# Patient Record
Sex: Female | Born: 1945 | Race: White | Hispanic: No | Smoking: Never smoker
Health system: Southern US, Community
[De-identification: ages and names within clinical notes are randomized; demographics above are authoritative.]

## PROBLEM LIST (undated history)

## (undated) DIAGNOSIS — E119 Type 2 diabetes mellitus without complications: Secondary | ICD-10-CM

## (undated) DIAGNOSIS — J449 Chronic obstructive pulmonary disease, unspecified: Secondary | ICD-10-CM

## (undated) DIAGNOSIS — R600 Localized edema: Secondary | ICD-10-CM

## (undated) DIAGNOSIS — R42 Dizziness and giddiness: Secondary | ICD-10-CM

## (undated) DIAGNOSIS — G43909 Migraine, unspecified, not intractable, without status migrainosus: Secondary | ICD-10-CM

## (undated) DIAGNOSIS — M4802 Spinal stenosis, cervical region: Secondary | ICD-10-CM

## (undated) DIAGNOSIS — I209 Angina pectoris, unspecified: Secondary | ICD-10-CM

## (undated) DIAGNOSIS — F32A Depression, unspecified: Secondary | ICD-10-CM

## (undated) DIAGNOSIS — E559 Vitamin D deficiency, unspecified: Secondary | ICD-10-CM

## (undated) DIAGNOSIS — I480 Paroxysmal atrial fibrillation: Secondary | ICD-10-CM

## (undated) DIAGNOSIS — I272 Pulmonary hypertension, unspecified: Secondary | ICD-10-CM

## (undated) DIAGNOSIS — Z531 Procedure and treatment not carried out because of patient's decision for reasons of belief and group pressure: Secondary | ICD-10-CM

## (undated) DIAGNOSIS — R002 Palpitations: Secondary | ICD-10-CM

## (undated) DIAGNOSIS — J45909 Unspecified asthma, uncomplicated: Secondary | ICD-10-CM

## (undated) DIAGNOSIS — K219 Gastro-esophageal reflux disease without esophagitis: Secondary | ICD-10-CM

## (undated) DIAGNOSIS — E079 Disorder of thyroid, unspecified: Secondary | ICD-10-CM

## (undated) DIAGNOSIS — J189 Pneumonia, unspecified organism: Secondary | ICD-10-CM

## (undated) DIAGNOSIS — R011 Cardiac murmur, unspecified: Secondary | ICD-10-CM

## (undated) DIAGNOSIS — I700 Atherosclerosis of aorta: Secondary | ICD-10-CM

## (undated) DIAGNOSIS — Z9889 Other specified postprocedural states: Secondary | ICD-10-CM

## (undated) DIAGNOSIS — I10 Essential (primary) hypertension: Secondary | ICD-10-CM

## (undated) DIAGNOSIS — G8929 Other chronic pain: Secondary | ICD-10-CM

## (undated) DIAGNOSIS — R519 Headache, unspecified: Secondary | ICD-10-CM

## (undated) DIAGNOSIS — I4510 Unspecified right bundle-branch block: Secondary | ICD-10-CM

## (undated) DIAGNOSIS — R0609 Other forms of dyspnea: Secondary | ICD-10-CM

## (undated) DIAGNOSIS — M1990 Unspecified osteoarthritis, unspecified site: Secondary | ICD-10-CM

## (undated) DIAGNOSIS — R296 Repeated falls: Secondary | ICD-10-CM

## (undated) DIAGNOSIS — E785 Hyperlipidemia, unspecified: Secondary | ICD-10-CM

## (undated) DIAGNOSIS — M349 Systemic sclerosis, unspecified: Secondary | ICD-10-CM

## (undated) DIAGNOSIS — F329 Major depressive disorder, single episode, unspecified: Secondary | ICD-10-CM

## (undated) DIAGNOSIS — I639 Cerebral infarction, unspecified: Secondary | ICD-10-CM

## (undated) DIAGNOSIS — T4145XA Adverse effect of unspecified anesthetic, initial encounter: Secondary | ICD-10-CM

## (undated) DIAGNOSIS — G4730 Sleep apnea, unspecified: Secondary | ICD-10-CM

## (undated) DIAGNOSIS — Z7901 Long term (current) use of anticoagulants: Secondary | ICD-10-CM

## (undated) DIAGNOSIS — I499 Cardiac arrhythmia, unspecified: Secondary | ICD-10-CM

## (undated) DIAGNOSIS — F419 Anxiety disorder, unspecified: Secondary | ICD-10-CM

## (undated) DIAGNOSIS — Z87898 Personal history of other specified conditions: Secondary | ICD-10-CM

## (undated) DIAGNOSIS — I509 Heart failure, unspecified: Secondary | ICD-10-CM

## (undated) DIAGNOSIS — R609 Edema, unspecified: Secondary | ICD-10-CM

## (undated) DIAGNOSIS — R413 Other amnesia: Secondary | ICD-10-CM

## (undated) DIAGNOSIS — I2510 Atherosclerotic heart disease of native coronary artery without angina pectoris: Secondary | ICD-10-CM

## (undated) DIAGNOSIS — I441 Atrioventricular block, second degree: Secondary | ICD-10-CM

## (undated) DIAGNOSIS — U071 COVID-19: Secondary | ICD-10-CM

## (undated) DIAGNOSIS — I4891 Unspecified atrial fibrillation: Secondary | ICD-10-CM

## (undated) DIAGNOSIS — K5790 Diverticulosis of intestine, part unspecified, without perforation or abscess without bleeding: Secondary | ICD-10-CM

## (undated) DIAGNOSIS — T8859XA Other complications of anesthesia, initial encounter: Secondary | ICD-10-CM

## (undated) DIAGNOSIS — I6789 Other cerebrovascular disease: Secondary | ICD-10-CM

## (undated) DIAGNOSIS — I5030 Unspecified diastolic (congestive) heart failure: Secondary | ICD-10-CM

## (undated) DIAGNOSIS — R0600 Dyspnea, unspecified: Secondary | ICD-10-CM

## (undated) DIAGNOSIS — G959 Disease of spinal cord, unspecified: Secondary | ICD-10-CM

## (undated) DIAGNOSIS — Z87442 Personal history of urinary calculi: Secondary | ICD-10-CM

## (undated) DIAGNOSIS — R112 Nausea with vomiting, unspecified: Secondary | ICD-10-CM

## (undated) DIAGNOSIS — E039 Hypothyroidism, unspecified: Secondary | ICD-10-CM

## (undated) DIAGNOSIS — M109 Gout, unspecified: Secondary | ICD-10-CM

## (undated) DIAGNOSIS — Z8719 Personal history of other diseases of the digestive system: Secondary | ICD-10-CM

## (undated) DIAGNOSIS — R51 Headache: Secondary | ICD-10-CM

## (undated) DIAGNOSIS — Z5309 Procedure and treatment not carried out because of other contraindication: Secondary | ICD-10-CM

## (undated) HISTORY — PX: SHOULDER SURGERY: SHX246

## (undated) HISTORY — PX: FRACTURE SURGERY: SHX138

## (undated) HISTORY — PX: NISSEN FUNDOPLICATION: SHX2091

## (undated) HISTORY — DX: Disorder of thyroid, unspecified: E07.9

## (undated) HISTORY — DX: Systemic sclerosis, unspecified: M34.9

## (undated) HISTORY — PX: ABDOMINAL HYSTERECTOMY: SHX81

## (undated) HISTORY — DX: Gastro-esophageal reflux disease without esophagitis: K21.9

## (undated) HISTORY — PX: EYE SURGERY: SHX253

## (undated) HISTORY — PX: CARDIAC CATHETERIZATION: SHX172

## (undated) HISTORY — DX: Essential (primary) hypertension: I10

## (undated) HISTORY — PX: TONSILLECTOMY: SUR1361

## (undated) HISTORY — PX: DILATION AND CURETTAGE OF UTERUS: SHX78

## (undated) HISTORY — PX: LITHOTRIPSY: SUR834

## (undated) HISTORY — PX: BLADDER SURGERY: SHX569

## (undated) HISTORY — PX: CARPAL TUNNEL RELEASE: SHX101

## (undated) HISTORY — DX: Gout, unspecified: M10.9

## (undated) HISTORY — DX: Type 2 diabetes mellitus without complications: E11.9

## (undated) NOTE — *Deleted (*Deleted)
[facility]  Name: Teresa [name]  MRN: [id number] DOB: September 09, 1945  Chief Complaint/ HPI  [name],  68 y.o. , female presents for their Follow-Up CCM visit with the clinical pharmacist via telephone due to COVID-19 Pandemic.  PCP : Maple Hudson., MD  Their chronic conditions include: ***  Office Visits:***  Consult Visit:***  Medications: Outpatient Encounter Medications as of 05/28/2020  Medication Sig Note  . acetaminophen (TYLENOL) 500 MG tablet Take 500 mg by mouth every 6 (six) hours as needed for mild pain or headache.   . allopurinol (ZYLOPRIM) 100 MG tablet TAKE 1 TABLET BY MOUTH EVERY DAY 05/17/2019: Takes at bedtime  . atorvastatin (LIPITOR) 10 MG tablet TAKE 1 TABLET BY MOUTH EVERY DAY AT BEDTIME   . benzonatate (TESSALON) 200 MG capsule Take 1 capsule (200 mg total) by mouth 3 (three) times daily. (Patient not taking: Reported on 12/05/2019)   . budesonide (PULMICORT) 0.5 MG/2ML nebulizer solution Inhale into the lungs.   . colchicine 0.6 MG tablet Take 0.6 mg by mouth daily as needed (for gout flare-ups).    . Continuous Blood Gluc Receiver (FREESTYLE LIBRE 2 READER) DEVI 1 each by Does not apply route continuous.   . Continuous Blood Gluc Sensor (FREESTYLE LIBRE 2 SENSOR) MISC USE EVERY 14 DAYS AS DIRECTED   . furosemide (LASIX) 40 MG tablet TAKE 1 TABLET BY MOUTH EVERY DAY   . gabapentin (NEURONTIN) 600 MG tablet Take 1 tablet (600 mg total) by mouth 3 (three) times daily.   Marland Kitchen glimepiride (AMARYL) 4 MG tablet TAKE 1 TABLET (4 MG TOTAL) BY MOUTH 2 (TWO) TIMES DAILY.   Marland Kitchen guaiFENesin-dextromethorphan (ROBITUSSIN DM) 100-10 MG/5ML syrup Take 5 mLs by mouth every 4 (four) hours as needed for cough. (Patient not taking: Reported on 12/05/2019)   . JARDIANCE 25 MG TABS tablet TAKE 25 MG BY MOUTH DAILY.   Marland Kitchen levothyroxine (SYNTHROID) 88 MCG tablet TAKE 1 TABLET BY MOUTH EVERY DAY   . losartan (COZAAR) 25 MG tablet Take 1 tablet (25 mg  total) by mouth daily.   Marland Kitchen menthol-cetylpyridinium (CEPACOL) 3 MG lozenge Take 1 lozenge (3 mg total) by mouth as needed for sore throat. (Patient not taking: Reported on 12/05/2019)   . metFORMIN (GLUCOPHAGE) 1000 MG tablet TAKE 1 TABLET BY MOUTH TWICE A DAY   . nystatin cream (MYCOSTATIN) Apply 1 application topically 2 (two) times daily. (Patient taking differently: Apply 1 application topically 2 (two) times daily as needed (for diabetic yeast reactions.). )   . ondansetron (ZOFRAN) 4 MG tablet TAKE 1 TABLET BY MOUTH EVERY 8 HOURS AS NEEDED FOR NAUSEA AND VOMITING   . OZEMPIC, 0.25 OR 0.5 MG/DOSE, 2 MG/1.5ML SOPN INJECT 0.5MG  INTO THE SKIN ONCE A WEEK.   . pioglitazone (ACTOS) 30 MG tablet TAKE 1 TABLET BY MOUTH EVERY DAY    No facility-administered encounter medications on file as of 05/28/2020.     Current Diagnosis/Assessment:  Goals Addressed   None       Diabetes   Recent Relevant Labs: Lab Results  Component Value Date/Time   HGBA1C 7.4 (A) 01/19/2020 10:29 AM   HGBA1C 7.4 (H) 09/18/2019 11:21 AM   HGBA1C 8.4 (H) 05/17/2019 03:05 AM   HGBA1C 7.6 (H) 04/04/2013 10:47 AM   MICROALBUR 50 06/06/2017 11:26 AM   MICROALBUR 20 12/08/2015 10:39 AM     Checking BG: {CHL HP Blood Glucose Monitoring [id number]  Recent FBG Readings: Recent pre-meal BG readings: 109 -  155 Recent 2hr PP BG readings:  *** Recent HS BG readings: *** Patient has failed these meds in past: *** Patient is currently {CHL Controlled/Uncontrolled:[phone]} on the following medications: ***  Last diabetic Foot exam:  Lab Results  Component Value Date/Time   HMDIABEYEEXA No Retinopathy 07/07/2019 12:00 AM    Last diabetic Eye exam: No results found for: HMDIABFOOTEX   We discussed:  Frequent hypoglycemia   Plan  Continue {CHL HP Upstream Pharmacy Plans:[phone]}  Medication Management   Pt uses *** pharmacy for all medications Uses pill box? {Yes or If no, why not?:20788} Pt  endorses ***% compliance  We discussed:  Hasn't tried 400mg  extra Living with son, but DIL is "evil" Son paying for medications   Plan  {US Pharmacy ZOXW:96045}    Follow up: *** month phone visit  ***

---

## 2004-06-15 ENCOUNTER — Ambulatory Visit: Payer: Self-pay

## 2004-10-30 ENCOUNTER — Ambulatory Visit (HOSPITAL_COMMUNITY): Admission: RE | Admit: 2004-10-30 | Discharge: 2004-10-30 | Payer: Self-pay | Admitting: Neurosurgery

## 2004-11-13 ENCOUNTER — Encounter: Payer: Self-pay | Admitting: Internal Medicine

## 2004-11-24 ENCOUNTER — Inpatient Hospital Stay (HOSPITAL_COMMUNITY): Admission: RE | Admit: 2004-11-24 | Discharge: 2004-12-06 | Payer: Self-pay | Admitting: Neurosurgery

## 2004-12-02 ENCOUNTER — Ambulatory Visit: Payer: Self-pay | Admitting: Critical Care Medicine

## 2004-12-20 ENCOUNTER — Ambulatory Visit (HOSPITAL_COMMUNITY): Admission: RE | Admit: 2004-12-20 | Discharge: 2004-12-20 | Payer: Self-pay | Admitting: Neurosurgery

## 2005-01-24 ENCOUNTER — Ambulatory Visit (HOSPITAL_COMMUNITY): Admission: RE | Admit: 2005-01-24 | Discharge: 2005-01-24 | Payer: Self-pay | Admitting: Neurosurgery

## 2005-03-31 ENCOUNTER — Ambulatory Visit (HOSPITAL_COMMUNITY): Admission: RE | Admit: 2005-03-31 | Discharge: 2005-03-31 | Payer: Self-pay | Admitting: Neurosurgery

## 2005-05-31 ENCOUNTER — Ambulatory Visit (HOSPITAL_COMMUNITY): Admission: RE | Admit: 2005-05-31 | Discharge: 2005-05-31 | Payer: Self-pay | Admitting: Neurosurgery

## 2005-08-22 ENCOUNTER — Ambulatory Visit: Payer: Self-pay

## 2005-10-02 ENCOUNTER — Ambulatory Visit (HOSPITAL_COMMUNITY): Admission: RE | Admit: 2005-10-02 | Discharge: 2005-10-02 | Payer: Self-pay | Admitting: Neurosurgery

## 2006-02-15 LAB — HM PAP SMEAR: HM PAP: NORMAL

## 2006-05-21 ENCOUNTER — Ambulatory Visit: Payer: Self-pay | Admitting: Unknown Physician Specialty

## 2006-09-10 ENCOUNTER — Emergency Department: Payer: Self-pay | Admitting: Emergency Medicine

## 2006-10-09 ENCOUNTER — Encounter: Admission: RE | Admit: 2006-10-09 | Discharge: 2006-11-05 | Payer: Self-pay | Admitting: Neurology

## 2006-10-25 ENCOUNTER — Ambulatory Visit: Payer: Self-pay | Admitting: Family Medicine

## 2006-11-06 ENCOUNTER — Encounter: Admission: RE | Admit: 2006-11-06 | Discharge: 2007-02-04 | Payer: Self-pay | Admitting: Neurology

## 2007-03-05 ENCOUNTER — Ambulatory Visit: Payer: Self-pay | Admitting: Internal Medicine

## 2007-03-05 LAB — CONVERTED CEMR LAB
BUN: 19 mg/dL
Basophils Absolute: 0.1 10*3/uL
Basophils Relative: 0.9 %
CO2: 31 meq/L
Calcium: 9.8 mg/dL
Chloride: 101 meq/L
Creatinine, Ser: 0.7 mg/dL
Eosinophils Absolute: 0.4 10*3/uL
Eosinophils Relative: 5.3 % — ABNORMAL HIGH
GFR calc Af Amer: 109 mL/min
GFR calc non Af Amer: 90 mL/min
Glucose, Bld: 142 mg/dL — ABNORMAL HIGH
HCT: 45.1 %
Hemoglobin: 15.1 g/dL — ABNORMAL HIGH
Lymphocytes Relative: 38.8 %
MCHC: 33.5 g/dL
MCV: 89.9 fL
Monocytes Absolute: 0.5 10*3/uL
Monocytes Relative: 6.7 %
Neutro Abs: 3.9 10*3/uL
Neutrophils Relative %: 48.3 %
Platelets: 252 10*3/uL
Potassium: 4.3 meq/L
Pro B Natriuretic peptide (BNP): 90 pg/mL
RBC: 5.02 M/uL
RDW: 13.3 %
Sed Rate: 4 mm/h
Sodium: 142 meq/L
WBC: 8 10*3/uL

## 2007-03-08 ENCOUNTER — Ambulatory Visit (HOSPITAL_COMMUNITY): Admission: RE | Admit: 2007-03-08 | Discharge: 2007-03-08 | Payer: Self-pay | Admitting: Internal Medicine

## 2007-04-16 ENCOUNTER — Ambulatory Visit: Payer: Self-pay | Admitting: Internal Medicine

## 2007-05-20 ENCOUNTER — Ambulatory Visit: Payer: Self-pay | Admitting: Internal Medicine

## 2007-07-03 ENCOUNTER — Ambulatory Visit: Payer: Self-pay | Admitting: Internal Medicine

## 2007-07-03 LAB — CONVERTED CEMR LAB
Chloride: 102 meq/L (ref 96–112)
Eosinophils Absolute: 0.4 10*3/uL (ref 0.0–0.6)
Eosinophils Relative: 4.9 % (ref 0.0–5.0)
GFR calc Af Amer: 94 mL/min
GFR calc non Af Amer: 78 mL/min
Glucose, Bld: 142 mg/dL — ABNORMAL HIGH (ref 70–99)
HCT: 43.1 % (ref 36.0–46.0)
Hemoglobin: 14.8 g/dL (ref 12.0–15.0)
Lymphocytes Relative: 41.1 % (ref 12.0–46.0)
MCV: 90.8 fL (ref 78.0–100.0)
Monocytes Absolute: 0.6 10*3/uL (ref 0.2–0.7)
Neutro Abs: 3.6 10*3/uL (ref 1.4–7.7)
Neutrophils Relative %: 45.3 % (ref 43.0–77.0)
Sodium: 142 meq/L (ref 135–145)
WBC: 7.8 10*3/uL (ref 4.5–10.5)

## 2007-07-16 ENCOUNTER — Ambulatory Visit: Payer: Self-pay | Admitting: Internal Medicine

## 2007-07-16 DIAGNOSIS — E119 Type 2 diabetes mellitus without complications: Secondary | ICD-10-CM | POA: Insufficient documentation

## 2007-07-16 DIAGNOSIS — E7800 Pure hypercholesterolemia, unspecified: Secondary | ICD-10-CM | POA: Insufficient documentation

## 2007-07-16 DIAGNOSIS — R059 Cough, unspecified: Secondary | ICD-10-CM | POA: Insufficient documentation

## 2007-07-16 DIAGNOSIS — R05 Cough: Secondary | ICD-10-CM

## 2007-07-16 DIAGNOSIS — M349 Systemic sclerosis, unspecified: Secondary | ICD-10-CM | POA: Insufficient documentation

## 2007-07-16 DIAGNOSIS — E1140 Type 2 diabetes mellitus with diabetic neuropathy, unspecified: Secondary | ICD-10-CM | POA: Insufficient documentation

## 2007-07-16 DIAGNOSIS — E669 Obesity, unspecified: Secondary | ICD-10-CM | POA: Insufficient documentation

## 2007-07-16 DIAGNOSIS — R0602 Shortness of breath: Secondary | ICD-10-CM | POA: Insufficient documentation

## 2007-07-16 DIAGNOSIS — K219 Gastro-esophageal reflux disease without esophagitis: Secondary | ICD-10-CM | POA: Insufficient documentation

## 2007-07-16 DIAGNOSIS — I259 Chronic ischemic heart disease, unspecified: Secondary | ICD-10-CM | POA: Insufficient documentation

## 2007-08-06 ENCOUNTER — Telehealth: Payer: Self-pay | Admitting: Adult Health

## 2007-09-13 ENCOUNTER — Ambulatory Visit: Payer: Self-pay | Admitting: Internal Medicine

## 2007-10-01 ENCOUNTER — Ambulatory Visit: Payer: Self-pay | Admitting: Gastroenterology

## 2007-10-01 LAB — CONVERTED CEMR LAB
Amylase: 91 units/L (ref 27–131)
Ferritin: 116 ng/mL (ref 10.0–291.0)
Folate: 8.1 ng/mL
Saturation Ratios: 28.4 % (ref 20.0–50.0)

## 2007-10-23 ENCOUNTER — Ambulatory Visit: Payer: Self-pay | Admitting: Gastroenterology

## 2007-10-23 ENCOUNTER — Encounter: Payer: Self-pay | Admitting: Gastroenterology

## 2007-10-23 DIAGNOSIS — K2990 Gastroduodenitis, unspecified, without bleeding: Secondary | ICD-10-CM

## 2007-10-23 DIAGNOSIS — K5730 Diverticulosis of large intestine without perforation or abscess without bleeding: Secondary | ICD-10-CM | POA: Insufficient documentation

## 2007-10-23 DIAGNOSIS — K2970 Gastritis, unspecified, without bleeding: Secondary | ICD-10-CM | POA: Insufficient documentation

## 2007-11-22 DIAGNOSIS — I209 Angina pectoris, unspecified: Secondary | ICD-10-CM | POA: Insufficient documentation

## 2007-11-22 DIAGNOSIS — M1990 Unspecified osteoarthritis, unspecified site: Secondary | ICD-10-CM | POA: Insufficient documentation

## 2007-11-22 DIAGNOSIS — F3289 Other specified depressive episodes: Secondary | ICD-10-CM | POA: Insufficient documentation

## 2007-11-22 DIAGNOSIS — E039 Hypothyroidism, unspecified: Secondary | ICD-10-CM | POA: Insufficient documentation

## 2007-11-22 DIAGNOSIS — F411 Generalized anxiety disorder: Secondary | ICD-10-CM | POA: Insufficient documentation

## 2007-11-22 DIAGNOSIS — I119 Hypertensive heart disease without heart failure: Secondary | ICD-10-CM | POA: Insufficient documentation

## 2007-11-22 DIAGNOSIS — G4730 Sleep apnea, unspecified: Secondary | ICD-10-CM | POA: Insufficient documentation

## 2007-11-22 DIAGNOSIS — R51 Headache: Secondary | ICD-10-CM | POA: Insufficient documentation

## 2007-11-22 DIAGNOSIS — R519 Headache, unspecified: Secondary | ICD-10-CM | POA: Insufficient documentation

## 2007-11-22 DIAGNOSIS — F419 Anxiety disorder, unspecified: Secondary | ICD-10-CM | POA: Insufficient documentation

## 2007-11-22 DIAGNOSIS — F329 Major depressive disorder, single episode, unspecified: Secondary | ICD-10-CM

## 2007-11-22 DIAGNOSIS — N200 Calculus of kidney: Secondary | ICD-10-CM | POA: Insufficient documentation

## 2007-11-23 ENCOUNTER — Emergency Department: Payer: Self-pay | Admitting: Emergency Medicine

## 2007-11-26 ENCOUNTER — Ambulatory Visit: Payer: Self-pay | Admitting: Gastroenterology

## 2007-12-03 ENCOUNTER — Telehealth (INDEPENDENT_AMBULATORY_CARE_PROVIDER_SITE_OTHER): Payer: Self-pay | Admitting: *Deleted

## 2008-01-07 ENCOUNTER — Ambulatory Visit: Payer: Self-pay | Admitting: Gastroenterology

## 2008-01-07 DIAGNOSIS — K449 Diaphragmatic hernia without obstruction or gangrene: Secondary | ICD-10-CM | POA: Insufficient documentation

## 2008-01-24 ENCOUNTER — Ambulatory Visit: Payer: Self-pay | Admitting: Internal Medicine

## 2008-02-05 ENCOUNTER — Ambulatory Visit: Payer: Self-pay | Admitting: Family Medicine

## 2008-02-11 ENCOUNTER — Encounter: Payer: Self-pay | Admitting: Internal Medicine

## 2008-02-11 ENCOUNTER — Ambulatory Visit: Admission: RE | Admit: 2008-02-11 | Discharge: 2008-02-11 | Payer: Self-pay | Admitting: Internal Medicine

## 2008-02-11 ENCOUNTER — Ambulatory Visit: Payer: Self-pay | Admitting: Internal Medicine

## 2008-02-24 LAB — HM DEXA SCAN: HM DEXA SCAN: NORMAL

## 2008-08-05 ENCOUNTER — Ambulatory Visit: Payer: Self-pay | Admitting: Family Medicine

## 2008-09-03 ENCOUNTER — Ambulatory Visit: Payer: Self-pay | Admitting: Family Medicine

## 2009-04-23 ENCOUNTER — Ambulatory Visit: Payer: Self-pay | Admitting: Family Medicine

## 2009-06-16 ENCOUNTER — Telehealth: Payer: Self-pay | Admitting: Gastroenterology

## 2009-06-21 ENCOUNTER — Encounter: Payer: Self-pay | Admitting: Physician Assistant

## 2009-06-21 ENCOUNTER — Ambulatory Visit: Payer: Self-pay | Admitting: Gastroenterology

## 2009-06-21 ENCOUNTER — Ambulatory Visit (HOSPITAL_COMMUNITY): Admission: RE | Admit: 2009-06-21 | Discharge: 2009-06-21 | Payer: Self-pay | Admitting: Gastroenterology

## 2009-06-21 DIAGNOSIS — I2510 Atherosclerotic heart disease of native coronary artery without angina pectoris: Secondary | ICD-10-CM | POA: Insufficient documentation

## 2009-06-21 DIAGNOSIS — I10 Essential (primary) hypertension: Secondary | ICD-10-CM | POA: Insufficient documentation

## 2009-06-21 DIAGNOSIS — R110 Nausea: Secondary | ICD-10-CM | POA: Insufficient documentation

## 2009-06-21 DIAGNOSIS — R509 Fever, unspecified: Secondary | ICD-10-CM | POA: Insufficient documentation

## 2009-06-21 DIAGNOSIS — R1011 Right upper quadrant pain: Secondary | ICD-10-CM | POA: Insufficient documentation

## 2009-06-21 DIAGNOSIS — R1319 Other dysphagia: Secondary | ICD-10-CM | POA: Insufficient documentation

## 2009-06-21 DIAGNOSIS — E785 Hyperlipidemia, unspecified: Secondary | ICD-10-CM | POA: Insufficient documentation

## 2009-06-21 LAB — CONVERTED CEMR LAB
Albumin: 4 g/dL (ref 3.5–5.2)
CO2: 28 meq/L (ref 19–32)
Calcium: 8.8 mg/dL (ref 8.4–10.5)
Eosinophils Relative: 4.2 % (ref 0.0–5.0)
GFR calc non Af Amer: 107.04 mL/min (ref >60)
Glucose, Bld: 154 mg/dL — ABNORMAL HIGH (ref 70–99)
HCT: 42 % (ref 36.0–46.0)
Hep B C IgM: NEGATIVE
Lipase: 24 units/L (ref 11.0–59.0)
Lymphocytes Relative: 33.4 % (ref 12.0–46.0)
Monocytes Relative: 7.1 % (ref 3.0–12.0)
Neutrophils Relative %: 54.7 % (ref 43.0–77.0)
Platelets: 220 10*3/uL (ref 150.0–400.0)
Sodium: 141 meq/L (ref 135–145)
Total Bilirubin: 0.6 mg/dL (ref 0.3–1.2)
Total Protein: 7.1 g/dL (ref 6.0–8.3)
WBC: 7.7 10*3/uL (ref 4.5–10.5)

## 2009-06-24 ENCOUNTER — Telehealth: Payer: Self-pay | Admitting: Physician Assistant

## 2009-07-09 ENCOUNTER — Ambulatory Visit (HOSPITAL_COMMUNITY): Admission: RE | Admit: 2009-07-09 | Discharge: 2009-07-09 | Payer: Self-pay | Admitting: Gastroenterology

## 2009-07-12 ENCOUNTER — Telehealth: Payer: Self-pay | Admitting: Gastroenterology

## 2009-08-24 ENCOUNTER — Ambulatory Visit: Payer: Self-pay | Admitting: Gastroenterology

## 2009-08-31 ENCOUNTER — Ambulatory Visit (HOSPITAL_COMMUNITY): Admission: RE | Admit: 2009-08-31 | Discharge: 2009-08-31 | Payer: Self-pay | Admitting: Gastroenterology

## 2009-09-30 ENCOUNTER — Ambulatory Visit: Payer: Self-pay | Admitting: Family Medicine

## 2009-09-30 ENCOUNTER — Inpatient Hospital Stay: Payer: Self-pay | Admitting: Internal Medicine

## 2009-11-04 ENCOUNTER — Emergency Department: Payer: Self-pay | Admitting: Emergency Medicine

## 2009-12-31 ENCOUNTER — Ambulatory Visit: Payer: Self-pay | Admitting: Family Medicine

## 2009-12-31 ENCOUNTER — Inpatient Hospital Stay: Payer: Self-pay | Admitting: *Deleted

## 2010-02-03 ENCOUNTER — Ambulatory Visit: Payer: Self-pay

## 2010-02-18 ENCOUNTER — Ambulatory Visit: Payer: Self-pay

## 2010-03-05 ENCOUNTER — Ambulatory Visit: Payer: Self-pay

## 2010-03-29 ENCOUNTER — Encounter: Payer: Self-pay | Admitting: Family Medicine

## 2010-04-07 ENCOUNTER — Encounter: Payer: Self-pay | Admitting: Family Medicine

## 2010-05-07 ENCOUNTER — Encounter: Payer: Self-pay | Admitting: Family Medicine

## 2010-06-07 ENCOUNTER — Encounter: Payer: Self-pay | Admitting: Family Medicine

## 2010-07-07 ENCOUNTER — Encounter: Payer: Self-pay | Admitting: Family Medicine

## 2010-08-07 ENCOUNTER — Encounter: Payer: Self-pay | Admitting: Family Medicine

## 2010-08-17 ENCOUNTER — Ambulatory Visit: Payer: Self-pay | Admitting: Family Medicine

## 2010-08-18 ENCOUNTER — Ambulatory Visit: Payer: Self-pay | Admitting: Family Medicine

## 2010-08-29 ENCOUNTER — Other Ambulatory Visit: Payer: Self-pay | Admitting: Internal Medicine

## 2010-09-07 ENCOUNTER — Encounter: Payer: Self-pay | Admitting: Family Medicine

## 2010-09-08 NOTE — Assessment & Plan Note (Signed)
Summary: F-UP NAUSEA/YF   History of Present Illness Visit Type: Follow-up Visit Primary GI MD: Sheryn Bison MD FACP FAGA Primary Provider: Julieanne Manson, MD Chief Complaint: Patient here to f/u on nausea. She complains that she is still nauseated, in fact, worse than before. She does have right sided abdominal pain not in relation to food. No change in bowels. History of Present Illness:   This patient is a 65 year old Caucasian female with chronic multiple GI complaints. She has a long history of chronic GERD and had laparoscopic fundoplication done 18 years ago. I've seen her for many years because of continued acid reflux, but endoscopic exams have been normal. She also has sleep apnea and chronic shortness of breath with negative bronchoscopy and normal pulmonary functions per Dr. Sherene Sires. There is some suggestion that she may have scleroderma although I cannot see where this has been documented. She does complain of symptoms consistent with Raynaud's phenomenon.  She also has diabetes and is on metformin but does not take insulin. 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 She also suffers from chronic thyroid dysfunction and hyperlipidemia. Her essential hypertension is well controlled on Bystolic and HCTZ and Lasix. GI-wise she complains of chronic constant nausea unresponsive to PPI therapy and Phenergan. She denies true reflux, dysphagia, emesis, weight loss, constipation, gas, bloating, but does have some early satiety. She denies any specific food intolerances. Recent ultrasound and CCK HIDA scans have been normal. She is on tramadol 4-5 times a day for chronic pain syndrome. She denies abuse of alcohol, cigarettes, or NSAIDs. She does have chronic shortness of breath, etiology unclear.   GI Review of Systems    Reports abdominal pain, chest pain, dysphagia with liquids, dysphagia with solids, loss of appetite, nausea, and  vomiting.    Location of  Abdominal pain: right side.    Denies acid reflux, belching, bloating, heartburn, vomiting blood, weight loss, and  weight gain.        Denies anal fissure, black tarry stools, change in bowel habit, constipation, diarrhea, diverticulosis, fecal incontinence, heme positive stool, hemorrhoids, irritable bowel syndrome, jaundice, light color stool, liver problems, rectal bleeding, and  rectal pain. Preventive Screening-Counseling & Management  Caffeine-Diet-Exercise     Does Patient Exercise: yes    Current Medications (verified): 1)  Hydrochlorothiazide 25 Mg  Tabs (Hydrochlorothiazide) .... Take 1 Tablet By Mouth Once A Day 2)  Lasix 40 Mg  Tabs (Furosemide) .... Take 1 Tablet By Mouth Once A Day 3)  Synthroid 75 Mcg  Tabs (Levothyroxine Sodium) .... Take 1 Tablet By Mouth Once A Day 4)  Crestor 5 Mg  Tabs (Rosuvastatin Calcium) .... Take 1 Tablet By Mouth Once A Day 5)  Metformin Hcl 1000 Mg Tabs (Metformin Hcl) .... Two Times A Day 6)  Bystolic 10 Mg  Tabs (Nebivolol Hcl) .... Take 1 Tablet By Mouth Once A Day 7)  Provigil 200 Mg  Tabs (Modafinil) .... Once Daily 8)  Lyrica 75 Mg  Caps (Pregabalin) .... Three Times A Day 9)  Mucinex Dm 30-600 Mg  Tb12 (Dextromethorphan-Guaifenesin) .... Take 2 Tablets By Mouth  Two Times A Day As Needed 10)  Tramadol Hcl 50 Mg  Tabs (Tramadol Hcl) .... Take 1 Tablet By Mouth Every 4 Hours Prn 11)  Allegra 180 Mg  Tabs (Fexofenadine Hcl) .... Take 1 Tablet By Mouth Once A Day As Need For Itching Sneezing or Vertigo 12)  Carafate 1 Gm/16ml  Susp (Sucralfate) .... Take 10 Ml By Mouth Four Times A Day As Needed  Allergies (verified): 1)  ! * Ivp Dye 2)  ! Morphine 3)  ! Biaxin 4)  ! Codeine 5)  ! * Ambien 6)  ! Neosporin  Past History:  Past medical, surgical, family and social histories (including risk factors) reviewed for relevance to current acute and chronic problems.  Past Medical History: Reviewed history from 06/21/2009 and no  changes required. 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 positive for heart disease in her father Positive for mature arthritis and one of her sister neg resp Family History of Breast Cancer: Maternal Grandmother (mets) , Aunt Family History of Colon Cancer: Maternal Grandmother (primary cancer) Family History of Ovarian Cancer: Aunt Family History of Colon Polyps: Mother Family History of Diabetes: Paternal Grandmother, Father, Sister x 5 Family History of Heart Disease: Mother, Father, Paternal Grandmother, Paternal Grandfather, Maternal Grandmother, Maternal Grandfather Family History of Liver Disease/Cirrhosis:Grandmother (cancer with mets to liver)  Social History: Reviewed history from 06/21/2009 and no changes required. Patient never smoked.  LPN still practicing at [employer] Alcohol Use - no Daily Caffeine Use-1 cup daily Illicit Drug Use - no Patient gets regular exercise. Does Patient Exercise:  yes  Review of Systems       The patient complains of depression-new, fatigue, night sweats, nosebleeds, shortness of breath, sleeping problems, and  swelling of feet/legs.  The patient denies allergy/sinus, anemia, anxiety-new, arthritis/joint pain, back pain, blood in urine, breast changes/lumps, change in vision, confusion, cough, coughing up blood, fainting, fever, headaches-new, hearing problems, heart murmur, heart rhythm changes, itching, menstrual pain, muscle pains/cramps, pregnancy symptoms, skin rash, sore throat, swollen lymph glands, thirst - excessive , urination - excessive , urination changes/pain, urine leakage, vision changes, and voice change.   General:  Complains of weakness and malaise; denies fever, chills, sweats, anorexia, fatigue, weight loss, and sleep disorder. CV:  Complains of dyspnea on exertion; denies chest pains, angina, palpitations, syncope, orthopnea, PND, peripheral edema, and claudication. Resp:  Complains of dyspnea with exercise; denies dyspnea at rest, cough, sputum, wheezing, coughing up blood, and pleurisy. GI:  Complains of nausea; denies difficulty swallowing, pain on swallowing, indigestion/heartburn, vomiting, vomiting blood, abdominal pain, jaundice, gas/bloating, diarrhea, constipation, change in bowel habits, bloody BM's, black BMs, and fecal incontinence. MS:  Complains of joint pain / LOM, joint stiffness, low back pain, and muscle cramps; denies joint swelling, joint deformity, muscle weakness, muscle atrophy, leg pain at night, leg pain with exertion, and shoulder pain / LOM hand / wrist pain (CTS). Derm:  Complains of dry skin; denies rash, itching, hives, moles, warts, and unhealing ulcers. Neuro:  Complains of radiculopathy other:; denies weakness, paralysis, abnormal sensation, seizures, syncope, tremors, vertigo, transient blindness, frequent falls, frequent headaches, difficulty walking, headache, sciatica, restless legs, memory loss, and confusion. Psych:  Denies depression, anxiety, memory loss, suicidal ideation, hallucinations, paranoia, phobia, and confusion. Endo:  Complains of cold  intolerance; denies heat intolerance, polydipsia,  polyphagia, polyuria, unusual weight change, and hirsutism. Heme:  Denies bruising, bleeding, enlarged lymph nodes, and pagophagia. Allergy:  Complains of hay fever.  Vital Signs:  Patient profile:   65 year old female Height:      62 inches Weight:      188 pounds BMI:     34.51 BSA:     1.86 Pulse rate:   56 / minute Pulse rhythm:   regular BP sitting:   130 / 62  (right arm)  Vitals Entered By: Hortense Ramal CMA Duncan Dull) (August 24, 2009 11:00 AM)  Physical Exam  General:  Well developed, well nourished, no acute distress.healthy appearing.   Head:  Normocephalic and atraumatic. Eyes:  PERRLA, no icterus.exam deferred to patient's ophthalmologist.   Neck:  Supple; no masses or thyromegaly. Chest Wall:  Symmetrical;  no deformities or tenderness. Lungs:  Clear throughout to auscultation. Heart:  Regular rate and rhythm; no murmurs, rubs,  or bruits. Abdomen:  Soft, nontender and nondistended. No masses, hepatosplenomegaly or hernias noted. Normal bowel sounds.There Is No secussion splash demonstrated. Msk:  Symmetrical with no gross deformities. Normal posture.I cannot see any swollen or hot joints. Her hands are not cold and there are no skin changes of scleroderma that I can appreciate. Pulses:  Normal pulses noted. Extremities:  No clubbing, cyanosis, edema or deformities noted.1+ pedal edema.   Neurologic:  Alert and  oriented x4;  grossly normal neurologically. Cervical Nodes:  No significant cervical adenopathy. Axillary Nodes:  No significant axillary adenopathy. Inguinal Nodes:  No significant inguinal adenopathy. Psych:  Alert and cooperative. Normal mood and affect.   Impression & Recommendations:  Problem # 1:  NAUSEA (ICD-787.02) Assessment Unchanged  This is a chronic constant problem for her without a definite diagnosis being determined. I think that a lot of her problems are functional in nature with many of  her diagnoses being  unsubstantiated.I think we need to screen diabetic gastroparesis with associated nausea versus nausea related to her multiple medications versus functional difficulties. I do not think that repeat endoscopy is indicated at this time with previous exams be in normal with normal esophageal and small bowel biopsies. She's had extensive hepatobiliary evaluation. I prescribed Zofran 4 mg every 8-12 hours as tolerated pending gastric emptying scan.  Orders: Gastric Emptying Scan (GES)  Problem # 2:  HYPERTENSION (ICD-401.9) Assessment: Improved Blood Pressure Today Is Normal at 130/62 and she is to continue all her meds per Dr. Sullivan Lone.  Problem # 3:  SLEEP APNEA (ICD-780.57) Assessment: Improved continue CPAP machine use and pulmonary followup as needed with Dr. Sandrea Hughs.  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 If she does have a collagen vascular disease syndrome it would seem more consistent with CREST syndrome and not full-blown scleroderma.  Patient Instructions: 1)  Copy sent to : Dr. Julieanne Manson 2)  Please continue current medications 3)  Zofran 4 mg every 8-12 hours trial 4)  Gastric emptying scan scheduled.  5)  The medication list was reviewed and reconciled.  All changed / newly prescribed medications were explained.  A complete medication list was provided to the patient / caregiver. Prescriptions: ZOFRAN 4 MG  TABS (ONDANSETRON HCL) 1 by mouth q 12 hrs as needed nausea  #60 x 3   Entered by:   Ashok Cordia RN  Authorized by:   Mardella Layman MD [facility]   Signed by:   Ashok Cordia RN on 08/24/2009   Method used:   Electronically to        [facility] [employer] [phone]* (retail)       [address] [place], [place]  [address]       Ph: [phone] or [phone]       Fax: [phone]   RxID:   [id number]

## 2010-10-06 ENCOUNTER — Encounter: Payer: Self-pay | Admitting: Family Medicine

## 2010-12-20 NOTE — Assessment & Plan Note (Signed)
Teresa [name]                             PULMONARY OFFICE NOTE   NAME:Teresa [name], [name]                       MRN:          [id number]  DATE:04/16/2007                            DOB:          [birth year]    HISTORY:  A 65 year old white female who says she has had progressive  dyspnea to the point where she can only walk about 50 feet before she  gives out.  She denies any orthopnea, PND, or leg swelling, fevers,  chills, or sweats, significant cough or chest pain of any kind.   For full inventory of medications, please see face sheet, column dated  April 16, 2007.   PHYSICAL EXAMINATION:  She constantly cleared her throat, she had no  evidence of excessive postnasal drainage, however.  Oropharynx is clear.  Nasal turbinates are normal.  Lung fields are perfectly clear bilaterally to auscultation and  percussion.  HEART:  Regular rhythm without murmurs, gallops, or rubs.  ABDOMEN:  Soft, benign.  EXTREMITIES:  Warm without calf tenderness, cyanosis, clubbing, or  edema.   Pulmonary function tests were reviewed and are normal.  Hemoglobin  saturation was 91% on room air, and CT scan was reviewed from March 08, 2007 indicating no significant sinus disease or abnormality within the  thorax.  Pulmonary function tests today are essentially normal.  The  diffusion capacity is normal.   Her ANA was positive at 1:160 per Dr. Corliss Skains with a speckled pattern.   IMPRESSION:  Chronic cough and dyspnea associated with excessive throat  clearing, most likely represents either nonspecific postnasal drip  syndrome or reflux.  I have recommended Nexium 40 mg per day for the  next month and reviewed a diet with her.  If her symptoms continue, the  next step might be a methacholine challenge test, noting that she is on  relatively high doses of Inderal, but I do not believe asthma is present  here.  Methacholine challenge might be considered, but note that she  has  no nocturnal symptoms to suggest asthma.   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 Corliss Skains, and, therefore, an echocardiogram to look at  right ventricular function needs to be performed as well.  Note also,  that it may well be a unifying diagnosis.  It is reflux related to  scleroderma in terms of many of her pulmonary symptoms at this point.     Charlaine Dalton. Sherene Sires, MD, [facility]  Electronically Signed    MBW/MedQ  DD: 04/16/2007  DT: 04/17/2007  Job #: [id number]

## 2010-12-20 NOTE — Op Note (Signed)
[name], Teresa [name]                ACCOUNT NO.:  [id number]   MEDICAL RECORD NO.:  [id number]          PATIENT TYPE:  AMB   LOCATION:  CARD                         [facility]ACILITY[facility]  [facility]   PHYSICIAN:  Casimiro Needle B. Sherene Sires, MD, FCCPDATE OF BIRTH:  [birth year]   DATE OF PROCEDURE:  02/11/2008  DATE OF DISCHARGE:                               OPERATIVE REPORT   PROCEDURE:  Bronchoscopy.   HISTORY AND INDICATIONS:  See attached pulmonary outpatient evaluation.  The patient states she continues to cough up bright red blood every  morning, including this morning, and came in for bronchoscopy for airway  inspection with a recent normal chest x-ray.  She has had no epistaxis.   The procedure was performed in the bronchoscopy suite with continuous  monitoring by ECG and oximetry and she received a total of 5 mg IV  Versed and 50 mg IV Demerol with adequate sedation and cough  suppression.   She was premedicated also with 1% lidocaine by updraft nebulizer and  additional 2% lidocaine jelly applied to the right naris.   Using a standard flexible fiberoptic video bronchoscope, the right naris  was easily cannulated with good visualization of the entire oropharynx  and larynx.  There were no upper airway lesions nor sources of active  bleeding nor blood anywhere in the upper airway identified.  The cords  moved normally and there were no apparent upper airway lesions.   Subsequently, the bronchoscope was passed via the cords with additional  1% lidocaine as needed.  The entire tracheobronchial tree was explored  twice, very carefully, segment by segment.  There was no evidence of a  bleeding source nor any retained blood anywhere in the airways.  The  airways actually looked pristine.   I did lavage the right middle lobe for completeness for cytology and AFB  staining culture to be complete.   IMPRESSION:  Hemoptysis of unclear etiology, must be arising above the  cords, although note the absence of  any blood in the upper airway even  on a morning when the patient reported that she was coughing up blood.  This brings up the question of possible fictitious illness.  Since she  is a never smoker, the likelihood of an upper or lower airway malignancy  is extremely small but the next logical step would be, if it persists,  to have an ENT evaluation done.   FOLLOW-UP:  I have discussed this case with her family member.  Recommend follow-up return to the office within 2 weeks.      Charlaine Dalton. Sherene Sires, MD, [facility]  Electronically Signed     MBW/MEDQ  D:  02/11/2008  T:  02/11/2008  Job:  [id number]   cc:   Julieanne Manson  Fax: [phone]

## 2010-12-20 NOTE — Assessment & Plan Note (Signed)
[facility]                         GASTROENTEROLOGY OFFICE NOTE   Teresa [name]                       MRN:          [id number]  DATE:10/01/2007                            DOB:          [birth year]    REFERRING PHYSICIAN:  Charlaine [name]. 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 Sherene [name] for a second opinion concerning chronic cough and  acid reflux symptoms.   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  She has had chronic  GERD for many years and underwent surgical fundal plication in 1994 in  [place], [place], and apparently had some postoperative  dysphagia, requiring dilations.  She continues with rather typical acid  reflux symptoms, coughing, and some hoarseness.  Over the last year, she  has noted progressive solid-food dysphagia in her mid substernal area.  She denies specific hepatobiliary complaints, but allegedly has known  gallstones.  She has alternating diarrhea and constipation with gas  and bloating, but denies melena or hematochezia.  She thinks she might  have had a colonoscopy at least ten years ago.  Endoscopic exam also has  been at least ten years ago.   The patient was recently on twice-a-day Nexium 40 mg with good  improvement in her symptoms, but had problems with finances and had to  stop this medication.  She currently is on generic metoclopramide 10 mg  four times a day without too much symptomatic improvement.  She denies  clay-colored stools, dark urine, icterus, fever or chills.  She has been  under a lot of stress recently with the death of her father and has had  anorexia, a 10-15 pound weight-loss, but denies any steatorrhea type  stools.  As mentioned above, she is under the care of Dr. Sullivan [name] and  Dr.  Corliss [name], but I do not have any recent laboratory tests.  CBC and  multi-chemistry profile from November of 2008 was normal, except for a  blood sugar of 142 mg%.  Chest x-ray in October of 2008 was apparently  normal.   PAST MEDICAL HISTORY:  Remarkable for sleep apnea.  She does use a CPAP  machine and Provigil.  She has hypertensive cardiovascular disease,  history of angina pectoris, hypercholesterolemia, adult-onset diabetes,  chronic thyroid dysfunction, degenerative arthritis, chronic anxiety and  depression, recurrent kidney stones, allergic sinusitis, chronic  headaches, has had a previous hysterectomy.   MEDICATIONS:  1. HCTZ 25 mg a day.  2. Lasix 40 mg a day.  3. Synthroid 75 mcg a day.  4. Crestor 5 mg a day.  5. Metformin 500 mg a day.  6. Bystolic 10 mg a day.  7. Provigil 200 mg a day.  8. Lyrica 75 mg three times a day for peripheral neuropathy.  9. Metoclopramide 10 mg q.i.d.  10.She also uses p.r.n. Mucinex, tramadol and Allegra.   ALLERGIES:  She has history of allergies to IVP DYE, MORPHINE, CODEINE  and BIAXIN.   FAMILY HISTORY:  Remarkable for atherosclerotic cardiovascular disease  and diabetes in several family members.  She also has a grandmother with  breast cancer and an aunt with ovarian/uterine cancer.  There are no  known gastrointestinal problems that run in her family.   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  She does not smoke, abuse  ethanol or give any history of illicit drug use.   REVIEW OF SYSTEMS:  Remarkable for multiple complaints, including  insomnia, arthralgias, myalgias, night sweats, hemoptysis, chronic  fatigue, shortness of breath with exertion, peripheral edema.  As  mentioned above, she has been worked up by Dr. Sherene [name] for her chronic  cough, who thinks a lot of this may be acid reflux in nature.  She  apparently has had coronary artery disease,  followed by a cardiologist  in [place].  From what I can see on reviewing her records from Dr.  Sullivan [name], patient has had normal sinus CT scan and chest CT scan from  August of 2008.   EXAMINATION:  She is a healthy-appearing white female, appearing  somewhat younger than her stated age.  She is 5 feet 2 and weighs 185 pounds.  Blood pressure is 120/64 and  pulse was 76 and regular.  I could not appreciate stigmata of chronic liver disease or thyromegaly.  Her chest was clear on my exam, without wheezes or rhonchi.  She was in a regular rhythm without murmurs, gallops or rubs.  Her abdomen showed no organomegaly, masses or localized tenderness.  Bowel sounds were normal.  Peripheral extremities showed no edema, phlebitis, or swollen joints.  Mental status was normal.  Rectal exam was deferred.   ASSESSMENT:  1. Status post fundal plication for chronic GERD, some 15 years ago.  2. Recurrent acid reflux, despite fundal plication surgery, with      possible underlying esophageal motility disorder.  She really does      have scleroderma and Raynaud's phenomenon.  3. Chronic cough, perhaps related to #2.  4. Multiple cardiovascular problems with the history of coronary      artery disease and hypertension, hyperlipidemia.  5. Adult-onset diabetes mellitus with what sounds like possible      peripheral neuropathy.  6. Chronic thyroid dysfunction, on Synthroid.  7. Rule out chronic malabsorption syndrome.  8. History of sleep apnea with CPAP machine and Provigil use.  9. History of recurrent nephrolithiasis.  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  11.History of chronic anxiety and depression.  12.History of chronic headaches.  13.Status post hysterectomy.  14.Multiple drug allergies, including IVP dye.  15.History of chronic obesity.   RECOMMENDATIONS:  1. I placed the patient on Kapidex with dual-action PPI.  I have       prescribed this at a level of 60 mg a day 30 minutes before the      first meal of the day.  2. Strict antireflux maneuvers.  3. Discontinue metoclopramide, since I doubt this has much effect if      she does have a neuromuscular esophageal disorder.  4. Repeat endoscopy and colonoscopy exam.  5. Consider esophageal manometry.  6. Screening laboratory parameters, including malabsorption screeners.  7. Continue other multiple medications, listed above.     Vania Rea. Jarold Motto, MD, [facility], FAGA  Electronically Signed    DRP/MedQ  DD: 10/01/2007  DT: 10/02/2007  Job #: [id number]

## 2010-12-20 NOTE — Assessment & Plan Note (Signed)
[facility]                             PULMONARY OFFICE NOTE   LEVERN, KALKA                       MRN:          [id number]  DATE:07/03/2007                            DOB:          [birth year]    HISTORY:  A 65 year old white female who carries a diagnosis of  scleroderma but with a normal CT scan of the chest dated August of 2008  and normal echocardiogram dated July of 2008.  She complains of dyspnea  for years walking more than 50 feet.  She also has continuous chest  pain for years that is made worse by coughing.  She was seen previously  on May 20, 2007 with the recommendation that she eliminate  nonspecific beta blockers because she reported subjective wheezing.  She  was switched over to specific beta blockers but denies any improvement  on Bystolic.   In addition, she has been having traces of bright red blood 3-4 times  per week for the last several weeks.   PHYSICAL EXAMINATION:  GENERAL:  She is a pleasant ambulatory white  female in no acute distress.  VITAL SIGNS:  She has stable vital signs.  HEENT:  Unremarkable.  Oropharynx is clear.  NECK:  Supple without cervical adenopathy or tenderness.  Trachea is  midline.  LUNGS:  Lung fields reveal no significant crackles on inspiration.  There are a few atypical upper airway rhonchi on expiration.  HEART:  There is a regular rate and rhythm without murmurs, gallops, or  rubs.  ABDOMEN:  Soft, benign.  EXTREMITIES:  Warm without calf tenderness, cyanosis, clubbing, or  edema.   IMPRESSION:  Chronic cough and chest discomfort along with unexplained  dyspnea on exertion.  All would suggest asthma except for the fact that  she has not previously responded to treatment directed at asthma,  including prednisone and the elimination of Inderal in favor of  Bystolic.  This brings up the possibility of eosinophilic bronchitis  (which is not typically associated with evidence of  asthma) and would  escape diagnosis by bronchoscopy.  Therefore, the next likely step in  the workup would be to proceed with bronchoscopy, but first I would like  for her to return for full medication reconciliation to make sure we are  all reading from the same page in terms of a very complex medical  regimen.   We walked the patient around the office only 1 lap before she gave  out.  Her saturation was still 89%, and her pulse rate was only 94 at  the time she stopped the test due to dyspnea and did not appear to be  tachypneic.   Due to the fact that she is continuing to have low-grade hemoptysis with  no clear etiology, I have asked her to completely stop aspirin between  now and her next visit as well.   In the mean time, I have recommended that she use Mucinex DM 2 b.i.d.  and suppress excess coughing with tramadol.     Charlaine Dalton. Sherene Sires, MD, [facility]  Electronically Signed    MBW/MedQ  DD: 07/04/2007  DT: 07/04/2007  Job #: [id number]   cc:   Julieanne Manson

## 2010-12-20 NOTE — Assessment & Plan Note (Signed)
[facility]                         GASTROENTEROLOGY OFFICE NOTE   Teresa [name], Teresa [name]                       MRN:          [id number]  DATE:11/26/2007                            DOB:          [birth year]    Teresa [name] continues with her cough and reflux symptoms despite a  negative endoscopy showing an intact fundoplication on October 23, 2007.  She is taking Kapidex 60 mg a day. Her colonoscopy showed extensive  diverticulosis but otherwise was normal.   Esophageal biopsies did not show any evidence of eosinophilic  esophagitis and biopsies for celiac disease were negative and CLO biopsy  for Helicobacter infection was negative.   The patient does have a puri tic rash on her chest and complains of some  thrush in her throat. She otherwise is doing fairly well but does have  scleroderma and is followed apparently by Dr. Julieanne Manson.   OTHER PERTINENT LABORATORY DATA:  She had a normal carotene but a low  vitamin D, 25 hydroxy level of 13, sed rate of 8 and a normal anemia  profile. CBC and metabolic profile were normal in November.   She is on multiple medications including thyroid replacement therapy and  diabetic medications and Provigil 200 mg a day along with Lyrica 75 mg  t.i.d.   She is awake and alert in no acute distress. She weighs 183 pounds which  is her normal weight. Blood pressure is 104/62 and pulse was 52 and  regular. She does have a papular skin rash on her chest but I cannot see  evidence of other systemic skin rashes at this time. Complete physical  examination was not performed.   ASSESSMENT:  1. I still think it is possible that Teresa [name] has regurgitation and      there is perhaps a bilious component to her reflux problems.      However, as mentioned above, she has had a previous fundoplication.  2. Probable candida skin rash associated with her chronic collagen-      vascular disease. She also, as mentioned above,  suffers from adult      onset diabetes.   RECOMMENDATIONS:  1. Diflucan 100 mg twice a day for 1 day and then 100 mg a day for a      total of 10 days.  2. Stop Kapidex and will try Carafate suspension 10 mL after meals and      at bedtime.  3. Will have her return in 2 week's time for followup and will      consider esophageal manometry and 24 pH probe test at that time.  4. Continue high fiber diet as tolerated for diverticulosis.   ADDENDUM:  She is on multiple medications and I have also asked her to  continue per primary care physician.     Vania Rea. Jarold Motto, MD, [facility], FAGA  Electronically Signed    DRP/MedQ  DD: 11/26/2007  DT: 11/26/2007  Job #: [id number]   cc:   Charlaine Dalton. Sherene Sires, MD, [facility]  Julieanne Manson  Dr. Corliss Skains

## 2010-12-20 NOTE — Assessment & Plan Note (Signed)
[facility]                             PULMONARY OFFICE NOTE   Teresa [name], Teresa [name]                       MRN:          [id number]  DATE:05/20/2007                            DOB:          10/06/1945    HISTORY:  This is a 65 year old white female with cough and dyspnea  dating back to April of 2006 associated with traces of bright red blood,  typically early in the morning and associated with throat clearing than  actual deep chest cough.  I had seen her on April 16, 2007 with  concern that she might have scleroderma but was able to document then  that she had completely normal pulmonary function tests, normal sinus CT  scan and chest CT scan dated March 08, 2007.  I had wondered about  whether she might have a component of  right heart strain but noted she  had a normal echocardiogram on February 07, 2007 with normal right sided  pressures.   She comes back today acutely worse over the last week with coughing,  congestion with thick green sputum and fever to 102 daily.  Interestingly, despite all of these complaints, she has no increase in  dyspnea over her baseline.   She does state that she has been taking Nexium perfectly regularly, 30  minutes before her first meal.  She has reduced her aspirin down to one  baby aspirin daily.   PHYSICAL EXAMINATION:  GENERAL APPEARANCE:  She is a pleasant white  female with somewhat of a belle indifference affect and attitude.  VITAL SIGNS:  She is afebrile with stable vital signs.  HEENT:  Is remarkable for mild to moderate nonspecific turbinate edema  with no purulent secretions noted.  Oropharynx is clear.  NECK:  Supple without cervical adenopathy or tenderness.  Trachea is  midline.  There is no thyromegaly.  LUNGS:  Lung fields reveal inspiratory and expiratory rhonchi that are  quite prominent and much different from her previous evaluation.  HEART:  Has regular rate and rhythm without murmurs, rubs  or gallops.  No increase in P2.  ABDOMEN:  Soft, benign.  EXTREMITIES:  Warm without calf tenderness, cyanosis, clubbing or edema.   IMPRESSION:  1. Despite normal pulmonary function tests documented in the past,      this patient clearly has rhonchi in the setting of what would      appear to be purulent tracheobronchitis, typical of an asthmatic      bronchitic.  I note that she is on relatively high doses of      Inderal, nonspecific beta blocker, and will take the liberty today      of switching her over to Bystolic 10 mg daily.  2. Because she has purulent tracheobronchitis, I recommended a five      day course of Levaquin 750 daily along with Mucinex DM two b.i.d.      p.r.n., Tramadol 50 mg one every 4 hours p.r.n. and a six day      course of prednisone.  3. This leaves unanswered the question of why she is  having such      severe symptoms chronically since we previously documented normal      pulmonary function tests (despite the Inderal on board).      Certainly, with a normal CT scan of the chest and echocardiogram,      it is unlikely to be due to scleroderma, despite the positive      serologies noted by Dr. Corliss Skains.   PLAN:  To sort through the differential, I have recommended that first  we see how she does off of nonspecific beta blockers and then consider  bronchoscopy to look for the source of purulent sputum and hemoptysis (a  symptom which seems quite disproportionate to objective findings  chronically), but could represent chronic eosinophilic bronchitis, which  really could only be diagnosed accurately with bronchial lavage.     Charlaine Dalton. Sherene Sires, MD, [facility]  Electronically Signed    MBW/MedQ  DD: 05/20/2007  DT: 05/21/2007  Job #: [id number]   cc:   Julieanne Manson

## 2010-12-20 NOTE — Assessment & Plan Note (Signed)
[name]                             PULMONARY OFFICE NOTE   NAME:LASHLEYDelona, Teresa [name]                       MRN:          [id number]  DATE:03/05/2007                            DOB:          [birth year]    CHIEF COMPLAINT:  Hemoptysis.   HISTORY:  A 65 year old white female with moderate obesity, complicated  by diabetes and ischemic heart disease.  Status post extensive back  surgery in April 2006 not right since.  She has been bothered by daily  cough, productive of traces of bright red blood to about a tablespoon of  blood when she clears her throat, ever since then, associated with  dyspnea.  The dyspnea appears worse when she talks and also worse with  exertion.  She denies any orthopnea or epistaxis, fevers, chills,  sweats, chest pain or leg swelling.   She says there really is no variability to her symptoms with her  hemoptysis in terms of weather, environmental changes.  She denies any  history of DVT or pleuritic pain and has not had any recent exertional  chest pain.   PAST MEDICAL HISTORY:  Significant however, for angina for which she  takes a full aspirin daily.  No other form of anticoagulation.  She is  also on antihypertensives but has never been on ACE inhibitors to her  knowledge.  She is status post remote hysterectomy as well as the back  surgery as noted above and also left ankle fracture but no recent  orthopedic work.   ALLERGIES:  IVP DYE, BIAXIN, CODEINE, MORPHINE, AMBIEN all with  nonspecific reactions.   MEDICATIONS:  Include Inderal, hydrochlorothiazide, Lasix, Synthroid,  multiple vitamins and aspirin 325 mg daily.   SOCIAL HISTORY:  She has never smoked.  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   FAMILY HISTORY:  As recorded.  Details significant for the absence of  respiratory diseases or cancer.   REVIEW OF SYSTEMS:  Also taken in detail on the worksheet, negative  except as  outlined above.   PHYSICAL EXAMINATION:  GENERAL:  This is a moderately obese, pleasant  ambulatory white female, clearing her throat frequently during her  interview and exam.  VITAL SIGNS:  She has stable vital signs.  HEENT:  Unremarkable.  Pharynx clear.  I could not appreciate any  obvious postnasal drainage, bloody or otherwise and nasal turbinates  were normal.  Ear canals clear bilaterally.  Dentition was intact.  NECK:  Supple without cervical adenopathy or tenderness.   LUNG FIELDS:  Perfectly clear by auscultation and percussion with  excellent air movement.  HEART:  There is regular rhythm without murmur, gallop, rub.  ABDOMEN:  Soft, benign.  EXTREMITIES:  Warm without calf tenderness, cyanosis, clubbing or edema.   A chest x-ray from June 2 is normal.   CBC today was normal with hematocrit of 45%, MCV of 89, sed rate of 4,  BMET was normal and BNP was only 90.   IMPRESSION:  Chronic cough and dyspnea are more of an issue of throat  clearing than a deep chest cough and has resulted in traces of bright  red blood chronically for months to years, probably related to the use  of aspirin and a low grade reflux effects on the upper airway.  For now  I see no evidence of a pulmonary source, no obvious sinusitis.  I  believe it would be best to proceed with a CT scan of the chest and  sinus just to be sure.  Since she is allergic to IVP DYE I would like to  have a contrasted study be done in the hospital using the protocol they  have for patients who have IVP allergy.   I did review with her general concepts regarding gastroesophageal  treatment, but did not actually start PPI today, pending the results of  the above studies.     Teresa [name]. Teresa Sires, MD, [facility]  Electronically Signed    MBW/MedQ  DD: 03/05/2007  DT: 03/06/2007  Job #: [id number]   cc:   Teresa [name]

## 2010-12-23 NOTE — Discharge Summary (Signed)
Teresa [name], SIMMON NO.:  [id number]   MEDICAL RECORD NO.:  [id number]          PATIENT TYPE:  INP   LOCATION:  3009                         FACILITY:  [facility]   PHYSICIAN:  Hewitt Shorts, M.D.DATE OF BIRTH:  06-04-1946   DATE OF ADMISSION:  11/24/2004  DATE OF DISCHARGE:  12/06/2004                                 DISCHARGE SUMMARY   ADMISSION HISTORY AND PHYSICAL:  The patient is a 65 year old right handed  white female who presented with low back and left lumbar radicular pain.  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  Further details of her history are included in her  admission note.  Examination showed an unremarkable general examination.  Neurological examination showed 5/5 strength in the dorsiflexors and plantar  flexors as well as the right extensor longus and the left extensor longus  4/5.  Sensation as intact.   HOSPITAL COURSE:  The patient underwent an L5 Gill procedure at the L5-S1  post lumbar antibody fusion with AVS peak interbody implants and bone graft  and a L5-S1 posterior right arthrodesis with 90 degree posterior  instrumentation and V-Toss with bone marrow aspirate.  The patient did well  from a surgical prospective, but she developed some respiratory difficulties  and was seen in medical consultation by the [facility].  The  patient was evaluated with CT scan of the chest to rule out pulmonary  embolism which was negative.  A VQ scan was done which was likewise  negative.  The patient's troponins were normal.  The patient continued to be  followed by the [facility].  They eventually consulted pulmonary  medicine who saw the patient.  They felt that the patient had significant  reflux disease, and they felt that there was some vocal cord dysfunction  related to the reflux disease.  The  patient was seen in physical therapy  consultation.  They worked with the patient through the postoperative  period.  At this point, she is up and ambulating actively.  She is afebrile.  She had a mildly elevated white blood cell count that has since returned to  normal.  She has had a small amount of serous drainage from her wound and  local wound care has been performed and she and her family have been  instructed in ongoing wound care.  The patient had been started on Protonix  which will be continued following discharge.  She will be instructed to  follow up with her primary care physician regarding her reflux disease.  She  has also been given a prescription for Dilaudid 2 mg q.4-6h. p.r.n. pain 40  tablets, no refills.   DISCHARGE DIAGNOSES:  1.  Bilateral L5 pars interarticularis defect.  2.  Grade I L5-S1 spondylolisthesis.  3.  Spondylitic disk herniation.  4.  Degenerative disk disease and spondylosis.  5.  Gastroesophageal reflux disease.      RWN/MEDQ  D:  12/06/2004  T:  12/06/2004  Job:  [id number]

## 2010-12-23 NOTE — H&P (Signed)
Teresa [name], Teresa [name] NO.:  [id number]   MEDICAL RECORD NO.:  [id number]          PATIENT TYPE:  INP   LOCATION:  2899                         FACILITY:  [facility]   PHYSICIAN:  Hewitt Shorts, M.D.DATE OF BIRTH:  1946-04-24   DATE OF ADMISSION:  11/24/2004  DATE OF DISCHARGE:                                HISTORY & PHYSICAL   HISTORY OF PRESENT ILLNESS:  The patient is a 65 year old, right-handed  white female who had presented with low back and left lumbar radicular pain.  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  The patient had been treated with extensive  conservative measures without relief and is admitted now for surgical  decompression and stabilization.   PAST MEDICAL HISTORY:  1.  History of hypertension.  2.  History of angina.  3.  No history of myocardial infarction, cancer, stroke, diabetes, peptic      ulcer disease, or lung disease.   She reports reactions to a number of medications, including, CODEINE, IV  CONTRAST, BIAXIN, VICODIN, and MORPHINE.   MEDICATIONS AT THE TIME OF ADMISSION:  1.  Tiazac 240 mg daily.  2.  Hydrochlorothiazide 25 mg daily.  3.  Allegra p.r.n.  4.  Aspirin 325 mg daily.  5.  Potassium 20 mEq daily.   PREVIOUS SURGERIES:  1.  Bladder tack.  2.  Left ankle surgery for a fracture in 2000.  3.  Hysterectomy.  4.  Right shoulder arthroscopy.  5.  Laparoscopic Nissen fundoplication.  6.  Tonsillectomy.  7.  Bilateral carpal tunnel releases.  8.  Right tennis elbow surgery.   FAMILY HISTORY:  Her parents have passed on.  There is a family history of  parkinsonism and stroke.   SOCIAL HISTORY:  The patient is an Astronomer.  She does not smoke, drink alcoholic  beverages, or have a history of substance abuse.  She is unmarried at this  time.   REVIEW OF SYSTEMS:  Notable for as  described in the history of present  illness and past medical history, but is otherwise unremarkable.   PHYSICAL EXAMINATION:  GENERAL:  Obese white female, in no acute distress.  VITAL SIGNS:  Temperature 97.1; pulse 66; blood pressure 141/93; respiratory  rate 16; height 5 feet 2 inches; weight 195 pounds.  LUNGS:  Clear to auscultation.  She has symmetrical respiratory excursion.  HEART:  Regular rate and rhythm.  S1, S2.  There is no murmur.  ABDOMEN:  Soft, nondistended.  Bowel sounds are present.  EXTREMITIES:  Shows no clubbing, cyanosis, or edema, other than there is  some chronic edema on the left ankle related to her old fracture and surgery  for such.  NEUROLOGIC:  Shows 5/5 strength in the dorsiflexors and plantar flexors  bilaterally, as well as the right extensor hallucis longus.  However, the  left extensor hallucis longus is 4/5.  Sensation is intact to pinprick to  the distal lower extremities.  Reflexes are minimal to the  quadriceps and  gastrocnemius.  They are symmetrical bilaterally.  Toes are downgoing  bilaterally.  Her gait and stance both favor the left lower extremity.   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   PLAN:  The patient will be admitted for an L5 Gill procedure and L5-S1  posterior lumbar interbody fusion with interbody implants and bone graft,  and bilateral L5-S1 posterolateral arthrodesis, with posterior  instrumentation and bone graft.   I discussed the nature of her condition and the nature of the proposed  procedure at length with her on a number of occasions in our office and used  a model as well as her x-rays and MRI scans to explain the nature and extent  of surgery.  We discussed the typical surgery, hospital stay for  recuperation, limitations postoperatively, the plan to use a Cell  Saver  during surgery, and the need for postoperative immobilization and lumbar  corset.  We discussed risks of surgery, including risks of infection,  bleeding, possibility of transfusion, the risk of nerve dysfunction, pain,  weakness, numbness, or paresthesias, the risk of dural tear and CSF leaks,  the possible need for further surgery, the risk of failure of the arthrosis,  possible need for further surgery, and anesthetic risks of myocardial  infarction, stroke, pneumonia, and death.  The patient is a TEFL teacher  Witness and refuses any blood products, although she is willing to receive  her own blood back from the Cell Saver.  She understands that that increases  her risks of stroke, myocardial infarction, and death, but accepts those  increased risks.  Understanding all of this, the patient wishes to proceed  with surgery and is admitted for such.      RWN/MEDQ  D:  11/24/2004  T:  11/24/2004  Job:  [id number]   cc:   Hewitt Shorts, M.D.  [address]  [place]  [place] [address]  Fax: [phone]

## 2010-12-23 NOTE — Op Note (Signed)
Teresa [name], Teresa [name]                ACCOUNT NO.:  [id number]   MEDICAL RECORD NO.:  [id number]          PATIENT TYPE:  INP   LOCATION:  2899                         FACILITY:  [facility]   PHYSICIAN:  Hewitt Shorts, M.D.DATE OF BIRTH:  1945-09-28   DATE OF PROCEDURE:  11/24/2004  DATE OF DISCHARGE:                                 OPERATIVE REPORT   PREOPERATIVE DIAGNOSES:  Bilateral L5 pars interarticularis defect, L5-S1  spondylolisthesis (grade 1), L5-S1 spondylitic disk herniation, degenerative  disk disease, and spondylosis.   POSTOPERATIVE DIAGNOSES:  Bilateral L5 pars interarticularis defect, L5-S1  spondylolisthesis (grade 1), L5-S1 spondylitic disk herniation, degenerative  disk disease, and spondylosis.   PROCEDURES:  1.  L5 Gill procedure.  2.  L1-S1 posterior lumbar interbody fusion with AVS PEEK interbody implants      and locally-harvested autograft.  3.  L5-S1 posterolateral arthrodesis with 90D posterior instrumentation and      Vitoss with bone marrow aspirate, with microdissection.   SURGEON:  Hewitt Shorts, M.D.   ASSISTANT:  Coletta Memos, M.D.   ANESTHESIA:  General endotracheal.   INDICATIONS:  The patient is a 65 year old woman who presented with low back  and left lumbar radicular pain, who was found to have an L5 pars  interarticularis defect with an associated grade 1 spondylolisthesis at L5  and S1 with broad-based spondylitic disk herniation and significant thecal  sac and nerve root compression.  A decision made to proceed with  decompression and arthrodesis.   PROCEDURE:  The patient was brought to the operating room and placed under  general endotracheal anesthesia.  The patient was turned to a prone  position.  The lumbar region was prepped with Betadine soap and solution and  draped in a sterile fashion.  The midline was infiltrated with local  anesthetic with epinephrine and a midline incision made and carried down  through the  subcutaneous tissue with bipolar cautery and electrocautery used  to maintain hemostasis.  Dissection was carried down to the lumbar fascia,  which was incised bilaterally, and the paraspinal muscles were dissected  from the spinous processes and laminae in a subperiosteal fashion.  An x-ray  was taken and the L5 and S1 spinous processes and laminae were identified,  and then dissection was carried out laterally exposing the ala of S1 and the  transverse process of L5.  It was noted that the posterior elements of L5  were very mobile, and we were able to dissect the remaining ligamentum  flavum and pseudoarthrosis and mobilize and remove the spinous process and  lamina and inferior articular process of L5.  These were subsequently  cleaned of all soft tissue and then morcellized using a bone mill to later  be implanted.  The microscope was then draped and brought into the field to  provide additional magnification, illumination and visualization, and the  decompression was performed using microdissection and microsurgical  technique.  Partial S1 facetectomy was performed.  The thecal sac and L5 and  S1 nerve roots were identified bilaterally.  Epidural veins were coagulated  as needed, and we  identified the annulus of the L5-S1 disk.  There was  significant broad-based disk herniation and we initiated a diskectomy  bilaterally with excision of the annulus and continued with a variety of  microcurettes and pituitary rongeurs.  Spondylitic overgrowth from the  posterior aspect of both L5 and S1 were removed, and particular attention  was paid to the L5 and S1 neural foramina so as to be able to decompress the  exiting L5 nerve roots.  The cartilaginous end plates of the corresponding  vertebrae were removed using paddle curettes, and then we measured the  height of the intervertebral disk space and selected 12 mm implants with 4  degree lordosis and height length of 25 mm.  Each of these was  packed with  morcellized autograft and then carefully positioned and counter sunk within  the intervertebral disk space.  We then took all the remaining morcellized  autograft and packed it in the intervertebral disk space around the cages,  and this was tamped into the intervertebral disk space.  We then proceeded  with the posterolateral arthrodesis.  The C-arm fluoroscopy unit was brought  in and we identified the pedicle entry sites posteriorly for both L5 and S1.  Each of the pedicles was probed, examined with a ball probe, no cut-outs  were found.  They were then tapped with a 5.25 tap, again examined with a  ball probe, no cut-outs were found, and good threading was noted, and we  then placed 6.75 x 40 mm screws at L5 and 6.75 x 35 mm screws at S1.  Once  all four screws were placed, we cut a 60 mm rod in half, it was placed in  each of the screw heads and then secured with locking caps, which were  subsequently tightened against a counter-torque.  We decorticated the  transverse processes and alae bilaterally and then we had a 5 mL strip of  Vitoss.  When we placed the pedicle screws, we had aspirated bone marrow  aspirate via the pedicle and vertebral body, which was injected over this  Vitoss, and the Vitoss was packed in the lateral gutter over the transverse  processes and alae.  The wound was irrigated on numerous occasions through  the procedure with bacitracin solution and once the decompression was  completed and the arthrodesis completed, we checked for hemostasis once  again and then proceeded with closure.  The deep fascia was closed with  interrupted, undyed 1 Vicryl suture, the deep subcutaneous layer was closed  with interrupted, undyed 1 Vicryl suture, the subcutaneous and subcuticular  layer were closed with interrupted, inverted 2-0 undyed Vicryl sutures, and  the skin was reapproximated with Dermabond.  The procedure was tolerated well.  The estimated blood loss  was 600 mL.  We were able to return 240 mL  of Cell Saver blood to the patient.  Sponge and needle count were correct.  Following surgery, the patient was turned back to the supine position to be  reversed from the anesthetic, extubated and transferred to the recovery room  for further care.      RWN/MEDQ  D:  11/24/2004  T:  11/24/2004  Job:  [id number]

## 2011-05-19 ENCOUNTER — Ambulatory Visit: Payer: Self-pay | Admitting: Specialist

## 2011-05-22 ENCOUNTER — Emergency Department: Payer: Self-pay | Admitting: Emergency Medicine

## 2011-05-26 ENCOUNTER — Ambulatory Visit: Payer: Self-pay | Admitting: Specialist

## 2011-07-14 ENCOUNTER — Emergency Department: Payer: Self-pay | Admitting: Emergency Medicine

## 2011-11-14 ENCOUNTER — Ambulatory Visit: Payer: Self-pay | Admitting: Family Medicine

## 2012-03-26 ENCOUNTER — Emergency Department: Payer: Self-pay | Admitting: Internal Medicine

## 2012-07-27 ENCOUNTER — Inpatient Hospital Stay: Payer: Self-pay | Admitting: Family Medicine

## 2012-07-27 LAB — CBC
MCH: 30.5 pg (ref 26.0–34.0)
MCHC: 33.9 g/dL (ref 32.0–36.0)
Platelet: 233 10*3/uL (ref 150–440)
RBC: 4.58 10*6/uL (ref 3.80–5.20)
RDW: 14.2 % (ref 11.5–14.5)

## 2012-07-27 LAB — OCCULT BLOOD X 1 CARD TO LAB, STOOL: Occult Blood, Feces: NEGATIVE

## 2012-07-27 LAB — URINALYSIS, COMPLETE
Bacteria: NONE SEEN
Bilirubin,UR: NEGATIVE
RBC,UR: 5 /HPF (ref 0–5)
Specific Gravity: 1.03 (ref 1.003–1.030)
Squamous Epithelial: 1
WBC UR: 2 /HPF (ref 0–5)

## 2012-07-27 LAB — COMPREHENSIVE METABOLIC PANEL
Alkaline Phosphatase: 85 U/L (ref 50–136)
Chloride: 107 mmol/L (ref 98–107)
EGFR (African American): >60
Potassium: 3.1 mmol/L — ABNORMAL LOW (ref 3.5–5.1)
SGOT(AST): 44 U/L — ABNORMAL HIGH (ref 15–37)
SGPT (ALT): 57 U/L (ref 12–78)
Total Protein: 7.3 g/dL (ref 6.4–8.2)

## 2012-07-28 LAB — CBC WITH DIFFERENTIAL/PLATELET
Basophil %: 0.6 %
Eosinophil %: 6.9 %
HCT: 36.4 % (ref 35.0–47.0)
HGB: 12.2 g/dL (ref 12.0–16.0)
Lymphocyte #: 3.1 10*3/uL (ref 1.0–3.6)
MCH: 30.3 pg (ref 26.0–34.0)
MCV: 91 fL (ref 80–100)
Monocyte #: 0.4 x10 3/mm (ref 0.2–0.9)
Monocyte %: 5.3 %
RBC: 4.02 10*6/uL (ref 3.80–5.20)

## 2012-07-28 LAB — BASIC METABOLIC PANEL
BUN: 9 mg/dL (ref 7–18)
Glucose: 154 mg/dL — ABNORMAL HIGH (ref 65–99)

## 2012-07-29 DIAGNOSIS — R079 Chest pain, unspecified: Secondary | ICD-10-CM

## 2012-07-29 LAB — TROPONIN I: Troponin-I: <0.02 ng/mL

## 2012-07-29 LAB — WBCS, STOOL

## 2012-07-29 LAB — CK TOTAL AND CKMB (NOT AT ARMC)
CK, Total: 169 U/L (ref 21–215)
CK-MB: 0.6 ng/mL (ref 0.5–3.6)

## 2012-07-29 LAB — BASIC METABOLIC PANEL
Co2: 23 mmol/L (ref 21–32)
Creatinine: 0.64 mg/dL (ref 0.60–1.30)
EGFR (African American): >60
EGFR (Non-African Amer.): >60
Glucose: 148 mg/dL — ABNORMAL HIGH (ref 65–99)
Potassium: 3.8 mmol/L (ref 3.5–5.1)
Sodium: 146 mmol/L — ABNORMAL HIGH (ref 136–145)

## 2012-07-29 LAB — PRO B NATRIURETIC PEPTIDE: B-Type Natriuretic Peptide: 1308 pg/mL — ABNORMAL HIGH (ref 0–125)

## 2012-07-30 LAB — BASIC METABOLIC PANEL
Anion Gap: 11 (ref 7–16)
BUN: 9 mg/dL (ref 7–18)
EGFR (Non-African Amer.): >60
Glucose: 194 mg/dL — ABNORMAL HIGH (ref 65–99)
Osmolality: 285 (ref 275–301)

## 2012-07-30 LAB — CBC WITH DIFFERENTIAL/PLATELET
Basophil #: 0.1 10*3/uL (ref 0.0–0.1)
Basophil %: 0.7 %
Eosinophil %: 16.1 %
HGB: 12.5 g/dL (ref 12.0–16.0)
Lymphocyte #: 2.9 10*3/uL (ref 1.0–3.6)
Lymphocyte %: 26.2 %
MCH: 30.6 pg (ref 26.0–34.0)
MCHC: 34 g/dL (ref 32.0–36.0)
Monocyte %: 5.1 %
Neutrophil #: 5.8 10*3/uL (ref 1.4–6.5)
Neutrophil %: 51.9 %
Platelet: 194 10*3/uL (ref 150–440)
RDW: 14.4 % (ref 11.5–14.5)

## 2012-07-30 LAB — TSH: Thyroid Stimulating Horm: 3.83 u[IU]/mL

## 2013-04-04 ENCOUNTER — Other Ambulatory Visit: Payer: Self-pay | Admitting: Family Medicine

## 2013-04-04 LAB — COMPREHENSIVE METABOLIC PANEL
Albumin: 4 g/dL (ref 3.4–5.0)
BUN: 11 mg/dL (ref 7–18)
Bilirubin,Total: 0.4 mg/dL (ref 0.2–1.0)
EGFR (African American): >60
EGFR (Non-African Amer.): >60
Osmolality: 279 (ref 275–301)
SGOT(AST): 33 U/L (ref 15–37)

## 2013-04-04 LAB — CBC WITH DIFFERENTIAL/PLATELET
Basophil #: 0.1 10*3/uL (ref 0.0–0.1)
Eosinophil #: 0.3 10*3/uL (ref 0.0–0.7)
Eosinophil %: 4.1 %
Lymphocyte #: 3.5 10*3/uL (ref 1.0–3.6)
MCH: 30.9 pg (ref 26.0–34.0)
MCHC: 34 g/dL (ref 32.0–36.0)
Monocyte %: 7.8 %
Neutrophil #: 2.7 10*3/uL (ref 1.4–6.5)
Platelet: 235 10*3/uL (ref 150–440)
RBC: 4.62 10*6/uL (ref 3.80–5.20)
RDW: 13.7 % (ref 11.5–14.5)

## 2013-04-04 LAB — HEMOGLOBIN A1C: Hemoglobin A1C: 7.6 % — ABNORMAL HIGH (ref 4.2–6.3)

## 2013-04-04 LAB — LIPID PANEL: Ldl Cholesterol, Calc: 117 mg/dL — ABNORMAL HIGH (ref 0–100)

## 2013-04-04 LAB — TSH: Thyroid Stimulating Horm: 0.842 u[IU]/mL

## 2013-06-18 ENCOUNTER — Emergency Department: Payer: Self-pay | Admitting: Emergency Medicine

## 2013-12-09 LAB — CBC AND DIFFERENTIAL
HEMATOCRIT: 43 % (ref 36–46)
HEMOGLOBIN: 14.5 g/dL (ref 12.0–16.0)
Neutrophils Absolute: 4 /uL
Platelets: 275 10*3/uL (ref 150–399)
WBC: 9.2 10^3/mL

## 2013-12-09 LAB — TSH: TSH: 1.73 u[IU]/mL (ref <=5.90)

## 2014-04-15 LAB — HEMOGLOBIN A1C: Hgb A1c MFr Bld: 7.9 % — AB (ref 4.0–6.0)

## 2014-05-14 ENCOUNTER — Emergency Department: Payer: Self-pay | Admitting: Emergency Medicine

## 2014-05-14 LAB — CBC
HCT: 43.6 % (ref 35.0–47.0)
HGB: 13.8 g/dL (ref 12.0–16.0)
MCH: 29.4 pg (ref 26.0–34.0)
MCHC: 31.7 g/dL — AB (ref 32.0–36.0)
MCV: 93 fL (ref 80–100)
PLATELETS: 246 10*3/uL (ref 150–440)
RBC: 4.69 10*6/uL (ref 3.80–5.20)
RDW: 14.7 % — AB (ref 11.5–14.5)
WBC: 8.4 10*3/uL (ref 3.6–11.0)

## 2014-05-14 LAB — URINALYSIS, COMPLETE
BILIRUBIN, UR: NEGATIVE
Bacteria: NONE SEEN
Blood: NEGATIVE
Glucose,UR: NEGATIVE mg/dL (ref 0–75)
Nitrite: NEGATIVE
Ph: 5 (ref 4.5–8.0)
Protein: NEGATIVE
Specific Gravity: 1.028 (ref 1.003–1.030)
WBC UR: 19 /HPF (ref 0–5)

## 2014-05-14 LAB — BASIC METABOLIC PANEL
Anion Gap: 10 (ref 7–16)
BUN: 16 mg/dL (ref 7–18)
CO2: 25 mmol/L (ref 21–32)
Calcium, Total: 9.3 mg/dL (ref 8.5–10.1)
Chloride: 106 mmol/L (ref 98–107)
Creatinine: 0.73 mg/dL (ref 0.60–1.30)
Glucose: 142 mg/dL — ABNORMAL HIGH (ref 65–99)
OSMOLALITY: 285 (ref 275–301)
POTASSIUM: 3.7 mmol/L (ref 3.5–5.1)
SODIUM: 141 mmol/L (ref 136–145)

## 2014-05-25 LAB — BASIC METABOLIC PANEL
BUN: 14 mg/dL (ref 4–21)
Creatinine: 0.7 mg/dL (ref 0.5–1.1)
Glucose: 226 mg/dL
POTASSIUM: 4 mmol/L (ref 3.4–5.3)
SODIUM: 138 mmol/L (ref 137–147)

## 2014-05-25 LAB — LIPID PANEL
CHOLESTEROL: 147 mg/dL (ref 0–200)
HDL: 41 mg/dL (ref 35–70)
LDL Cholesterol: 68 mg/dL
LDL/HDL RATIO: 1.7
Triglycerides: 190 mg/dL — AB (ref 40–160)

## 2014-05-25 LAB — HEPATIC FUNCTION PANEL
ALT: 37 U/L — AB (ref 7–35)
AST: 32 U/L (ref 13–35)
Alkaline Phosphatase: 82 U/L (ref 25–125)

## 2014-06-30 ENCOUNTER — Emergency Department: Payer: Self-pay | Admitting: Emergency Medicine

## 2014-09-10 DIAGNOSIS — J431 Panlobular emphysema: Secondary | ICD-10-CM | POA: Insufficient documentation

## 2014-10-23 ENCOUNTER — Encounter
Admit: 2014-10-23 | Disposition: A | Discharge status: Home / Self Care | Payer: Self-pay | Attending: Orthopaedic Surgery | Admitting: Orthopaedic Surgery

## 2014-11-06 ENCOUNTER — Encounter
Admit: 2014-11-06 | Disposition: A | Discharge status: Home / Self Care | Payer: Self-pay | Attending: Orthopaedic Surgery | Admitting: Orthopaedic Surgery

## 2014-11-27 NOTE — Discharge Summary (Signed)
PATIENT NAME:  Teresa [name], Teresa [name] MR#:  [id number] DATE OF BIRTH:  1946-06-02  DATE OF ADMISSION:  07/27/2012 DATE OF DISCHARGE:  07/30/2012  REASON FOR ADMISSION: Nausea, vomiting and diarrhea with abdominal pain.   DISCHARGE DIAGNOSES: 1.  Nausea, diarrhea and abdominal pain due to viral gastroenteritis.  2.  Headache with diplopia.  3.  Chest pain likely due to diastolic congestive heart failure with acute exacerbation.  4.  Diastolic congestive heart failure with acute exacerbation.  5.  Hypomagnesemia.  6.  Hypokalemia.  7.  Depression.  8.  Hypothyroidism.  9.  Diabetes.  10.  Scleroderma.  11.  Obstructive sleep apnea.  12.  Nephrolithiasis, nonobstructive. 13.  Possible gastritis or intrinsic gastric wall abnormality.    IMPORTANT RESULTS:  1.   CT of the abdomen and pelvis showing bilateral punctate  nonobstructive renal calculus.  2.  CT of the head no evidence of acute ischemic or hemorrhagic event, no intracranial mass. No hydrocephalus. No acute paranasal sinus inflammatory changes. Mild age-related white matter density changes as well as mild bifrontal atrophy.  3.  Creatinine is 0.86, sodium 141. At admission, potassium was 3.1, at discharge 3.6. Magnesium is 1.2 at discharge.  The patient was supplemented with oral magnesium prior to discharge.  4.  EKG showed bigeminy.  5.  White blood cells 11.2.  6.  Lipase was not elevated.  7.  TSH was 3.83.  8.  Cardiac enzymes were negative.  9.  X-ray of the chest on the 23rd showed atelectasis and vascular crowding from low lung volumes, likely interstitial pulmonary edema.   DISPOSITION: Home.   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 The patient states that the pain was going on for a couple days prior to coming to the ER. She had significant diffuse pain which  was cramping on occasions but usually just lots of pressure. She had 6 to 7 out of 10 intensity and was having multiple bowel movements, 3 to 4 a day, with diarrhea that became greenish and mucousy. The patient was seen in the ER, CT scan was done. CT scan results are mentioned above. The main concern was some thickening or engorgement of the gastric fundus which could correlate to her previous Nissen fundoplication. Although the patient was told that she needed to follow up with GI for possible EGD at some point, she states that she will follow up and make an appointment with a GI doctor. The patient who was admitted for treatment of mild dehydration and abdominal pain since she had intractable symptoms and she was not able to eat or drink anything. The patient started having some improvement of the abdominal pain by the second day although she did not eat anything and she was not tolerated pretty well her meals. She was put on metronidazole, waiting for studies just as a prophylactic in case she had C. diff. 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 The C. diff. tox came out negative twice. There were no white blood cells in her stool and there was no growth of bacteria. There was no Salmonella or Shigella, no pathogenic E. coli detected as well, no Campylobacter antigen was detected. Occult blood in feces was negative.   The patient was doing well by day #3 although I had to come back  and check on her because of development of shortness of breath, chest pain. Cardiac workup was done and EKG showed bigeminy but no ST depression or elevation. Her troponin was negative. A chest x-ray showed mild pulmonary congestion. This is likely due to the fluids that the patient was getting. She has diastolic dysfunction in the past. She has had a cardiac cath done by Dr. Clayborn Bigness in January of 2011. This showed normal EF with an ejection fraction of 65%.  Left catheterization performed showing normal mid  LAD 50% stenosis and circumflex was normal. RCA was normal. I spoke with Dr. Clayborn Bigness. He was aware of these changes.  He said that he will be happy to see the patient in the office on Friday but he did not think there was anything different to do. The patient was diuresed and started feeling very well almost immediately. Fluids were stopped.   The following day, the patient developed diplopia acutely and a headache for what full neurologic exam was done without showing any significant findings. Also a CT scan of the head was done and no changes. At this moment, the differential myasthenia gravis and MS could be possible, especially with the patient having CREST. The patient also could have a lesion of the brain stem although the diplopia went away and the patient states that it might come back a couple of times and then go away again, for what is not likely to be a space-occupying lesion. In spite of that, I recommended patient to followup with Dr. Jennings Books, who is 1 of the neurologists, also to follow up with her ophthalmologist. The patient needs to follow up with Dr. Manuella Ghazi, Dr. Dionne Milo or any other GI doctor, and her ophthalmologist, and Dr. Clayborn Bigness as well.   CONDITION ON DISCHARGE:  The patient is discharged in good condition.   DISCHARGE INSTRUCTIONS:   1.  For her magnesium she has been told to have over-the-counter magnesium pills and to eat a regular diet.  2.  The patient is discharged in good condition and she has been asked to come back if there is any other issues. No echocardiogram was done due to the fact that the patient had a good EF within the last 2 years and this was a very short flare-up of CHF due to fluid overload and diastolic dysfunction.  3.  The patient is to follow up with Dr. Clayborn Bigness.  I spent more than 35 minutes with this patient.      ____________________________ Saltillo Sink, MD rsg:cs D: 07/31/2012 18:28:00 ET T: 07/31/2012 20:23:18  ET JOB#: [id number]  cc: Hatley Sink, MD, <Dictator> Kamile Fassler America Brown MD ELECTRONICALLY SIGNED 08/08/2012 7:35

## 2014-11-27 NOTE — H&P (Signed)
PATIENT NAME:  Teresa [name], Teresa [name] MR#:  [id number] DATE OF BIRTH:  1946/05/04  DATE OF ADMISSION:  07/27/2012  REASON FOR ADMISSION: Severe nausea, vomiting, and diarrhea, unable to hold food, mild dehydration and abdominal pain.    REFERRING PHYSICIAN: Dr. Conni Slipper  PRIMARY CARE PHYSICIAN:  Dr. 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 The patient states that everything started out of blue. She was feeling really bad for a day, and then the symptoms started to improve. They stopped for one day or two and then started back again, and she has been doing that back and forth. The patient states that as of today the pain was so severe that she needed to come to the ER.  Her pain was about 6 or 7 out of 10 at some point, but now it has declined to less intensity. The patient has significant diarrhea, states that it is liquid; and during the last 3 or 4 days it has turned to greenish, mucousy and with some blood on it. The patient says that every time that she vomits she has incontinent bowel movements due to the force of the vomiting and the liquidity of the diarrhea. Today she has had about 3 to 4 vomiting episodes with 3 to 4 bowel movements that were incontinent. The patient states that in the past she had a Nissen surgery, and her nausea does not really makes her vomit all the time. She does have chronic nausea, but today she has been vomiting and dry heaving a lot. The patient otherwise has had pain that is sharp. It is located on the whole abdomen, but it is worse in the left lower quadrant.   REVIEW OF SYSTEMS:  Positive for fever up to 102 today. Negative for weight loss, weight gain. Positive weakness. Very tired today.  EYES: No blurry vision. No glaucoma. No cataracts.  ENT: No tinnitus. No allergies. No difficulty  swallowing.  RESPIRATORY: No cough. No wheezing. No hemoptysis.  CARDIOVASCULAR: No chest pain. No orthopnea. No arrhythmias. No palpitations. The patient has history of coronary artery disease with a 50% RCA lesion with medical management.   GASTROINTESTINAL: Positive nausea, positive vomiting. Positive diarrhea. Positive abdominal pain. Positive blood in the stool, positive mucus in the stool. This is the first time this happened. No melena.  GENITOURINARY: No dysuria or hematuria. No incontinence.  ENDOCRINOLOGY: No polyuria, polydipsia, or polyphagia. No cold or heat intolerance. The patient has hypothyroidism that is well controlled.  HEMATOLOGIC/LYMPHATIC: No anemia. No easy bruising. No swollen glands.  SKIN: Without any significant acne, rashes, changes on moles.  MUSCULOSKELETAL: No significant back pain, neck pain. Positive gout, but it is well controlled.  NEUROLOGIC: No numbness, tingling. No significant weakness. She has diabetic neuropathy, but it is also well controlled with Lyrica.  PSYCHIATRIC: Negative for significant insomnia or depression.   PAST MEDICAL HISTORY: 1. Scleroderma.  2. Diabetes type 2, non-insulin-dependent.  3. History of depression.  4. Hypothyroidism.  5. Migraines.  6. Osteoarthritis.  7. Hypertension.  8. Pulmonary hypertension by echocardiogram.  9. Coronary artery disease with a 50% lesion at the level of the RCA, medical management.  10. Gout.  11. Obstructive sleep apnea, on CPAP.  12. Gastroesophageal reflux disease.  13. Status post dilation of stricture of the esophagus and a  Nissen fundoplication.  14. History of angina.  15. Chronic nausea.  16. The patient is a Restaurant manager, fast food.  PAST SURGICAL HISTORY:  1. Nissen fundoplication.  2. Lumbar fusion.   ALLERGIES: THE PATIENT IS ALLERGIC TO MULTIPLE MEDICATIONS INCLUDING LIDOCAINE, MORPHINE, DILAUDID, AMBIEN, CONTRAST DYE, BIAXIN, NEOSPORIN, TAMIFLU, XYLOCAINE.   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  She does not smoke, and does not drink, or use any other drugs.   FAMILY HISTORY: Positive for coronary artery disease, hypertension, stroke. No cancer.   MEDICATIONS: Lyrica 150 mg twice a day, Lasix 20 mg once a day, colchicine 0.6 mg p.r.n. gout flares, Provigil 200 mg every morning, allopurinol 100 mg twice daily, Synthroid 88 mcg daily, hydrochlorothiazide 25 mg daily, Crestor 5 mg daily, metformin 1000 mg twice daily, glipizide 4 mg, losartan/hydrochlorothiazide  50/12 .5 mg once a day.   PHYSICAL EXAMINATION: VITAL SIGNS: Blood pressure 98/46, pulse 80, respiratory rate 18. She is afebrile with temperature of 98.4.  GENERAL: The patient is alert and oriented x3, in no acute distress. No respiratory distress. Hemodynamically stable.  HEENT: Pupils are equal and reactive. Extraocular movements are intact. Mucosa are dry. Anicteric sclerae. Pink conjunctivae. No oral lesions. No oropharyngeal exudates.  NECK: Supple. No JVD. No thyromegaly. No adenopathy. No carotid bruits. Normal range of motion.  CARDIOVASCULAR: Regular rate and rhythm. No murmurs, rubs, or gallops. No displacement of PMI. No tenderness to palpation of anterior wall.  LUNGS: Clear without any wheezing or crepitus. No use of accessory muscles.  ABDOMEN: Soft, mildly distended. Tender to palpation diffusely with major tenderness on the left lower quadrant. No rebound. No guarding.  GENITAL: No external lesions.  EXTREMITIES: No edema, no cyanosis, no clubbing. Pulses +2. Capillary refill less than 3.  NEUROLOGIC: Cranial nerves II through XII are intact. Strength is 5 out of 5 in 4 extremities.  PSYCHIATRIC: Negative for insomnia, nervousness or agitation.  SKIN: Without any significant rashes or petechia, decreased turgor. The patient has very dry and dehydrated skin.  LYMPHATICS: Negative for lymphadenopathy in the neck or supraclavicular area.    LABORATORY, DIAGNOSTIC AND RADIOLOGICAL DATA:  Glucose 193, potassium 3.1, serum sodium 142, AST 44. White count is 11.1. Hemoglobin is 14.0. Urinalysis with normal limits.   CT scan: Bilateral punctate nonobstructing renal calculus. Tissue prominence of the fundus of the stomach which might represent secondary hyperdistention versus gastritis, with thickening /wall abnormality. To correlate with this, the patient has a Nissen fundoplication on that area which could be what is relating to this. The radiologist recommends upper endoscopy. Hepatic steatosis is also noticed.   ASSESSMENT AND PLAN: The patient is a 69 year old female with history of hypertension, diabetes, depression, scleroderma, osteoarthritis, pulmonary hypertension, coronary artery disease with 50% RCA lesion, obstructive sleep apnea on BiPAP, gout, gastroesophageal reflux.   1. Diarrhea, nausea and vomiting: Likely the patient has acute gastroenteritis. The findings on the CT scan could be related either to her previous surgery, Nissen fundoplication, although it could be just reflecting some gastritis. I will recommend an outpatient EGD.  At this moment the patient is admitted because she still is not feeling very well. She feels like she cannot hold any fluids or food if she goes home which puts her at risk of severe dehydration. We are going to check several things. 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 diff, and I am going  to treat it prophylactically with metronidazole because the problem has been going on for two weeks. She had severe fever. She had an elevated white blood count, and she started having green stool with occasional blood on it for the past several days. She does have diverticulosis but no signs of diverticulitis for which I do not think there is need to cover for anything else other than C. diff. The patient has mild dehydration for which we are going to give her  IV fluids, replace her potassium, and also advance her diet slowly. White blood cells, C. diff  and comprehensive culture are ordered. Treatment for nausea with Phenergan and Zofran. Abdominal pain, Fentanyl due to her major allergies, and also we are going to add on bentyl and simethicone. We will discharge her tomorrow if the patient is feeling better.  2. Depression, continue current treatment.  3. Hypothyroidism: . Current treatment. 4. Diabetes: Insulin sliding scale, and if she tolerates we will give her her home medications. I think she will be able to tolerate them. Her blood sugars are elevated at 193.  5. Other medical problems are stable at this moment. Her coronary artery disease is asymptomatic. Her scleroderma is at this moment asymptomatic, too. She has not got gout flare-up. Her obstructive sleep apnea, she is going to use CPAP here at this hospitalization, and we are going to put her on a PPI.   CODE STATUS:  FULL CODE.          TIME SPENT:  About 45 minutes with this admission.  ____________________________ Westmoreland Sink, MD rsg:cb D: 07/27/2012 20:43:35 ET T: 07/28/2012 08:54:29 ET JOB#: [id number]  cc: Harvel Sink, MD, <Dictator> Payten Beaumier America Brown MD ELECTRONICALLY SIGNED 08/08/2012 7:35

## 2014-12-08 ENCOUNTER — Encounter: Payer: PRIVATE HEALTH INSURANCE | Admitting: Physical Therapy

## 2014-12-11 ENCOUNTER — Encounter: Payer: PRIVATE HEALTH INSURANCE | Admitting: Physical Therapy

## 2014-12-14 ENCOUNTER — Encounter: Payer: PRIVATE HEALTH INSURANCE | Admitting: Physical Therapy

## 2014-12-18 ENCOUNTER — Encounter: Payer: PRIVATE HEALTH INSURANCE | Admitting: Physical Therapy

## 2014-12-22 ENCOUNTER — Encounter: Payer: PRIVATE HEALTH INSURANCE | Admitting: Physical Therapy

## 2014-12-25 ENCOUNTER — Encounter: Payer: PRIVATE HEALTH INSURANCE | Admitting: Physical Therapy

## 2014-12-30 ENCOUNTER — Encounter: Payer: PRIVATE HEALTH INSURANCE | Admitting: Physical Therapy

## 2015-01-01 ENCOUNTER — Encounter: Payer: PRIVATE HEALTH INSURANCE | Admitting: Physical Therapy

## 2015-01-05 ENCOUNTER — Encounter: Payer: PRIVATE HEALTH INSURANCE | Admitting: Physical Therapy

## 2015-01-05 ENCOUNTER — Encounter: Payer: Self-pay | Admitting: Physical Therapy

## 2015-01-05 ENCOUNTER — Ambulatory Visit: Payer: BLUE CROSS/BLUE SHIELD | Attending: Orthopaedic Surgery | Admitting: Physical Therapy

## 2015-01-05 DIAGNOSIS — M25511 Pain in right shoulder: Secondary | ICD-10-CM | POA: Diagnosis present

## 2015-01-05 DIAGNOSIS — M6281 Muscle weakness (generalized): Secondary | ICD-10-CM | POA: Insufficient documentation

## 2015-01-06 ENCOUNTER — Encounter: Payer: Self-pay | Admitting: Physical Therapy

## 2015-01-06 NOTE — Patient Instructions (Signed)
Instructed in home exercises for right UE: pendulums, scapular adduction in sitting/standing, AAROM right shoulder in supine lying

## 2015-01-06 NOTE — Therapy (Signed)
[facility] [address] [place], [address] Phone: [phone]   Fax:  [phone]  Physical Therapy Evaluation  Patient Details  Name: Teresa [name] MRN: [id number] Date of Birth: 20-Feb-1946 Referring Provider:  Melrose Nakayama, MD  Encounter Date: 01/05/2015      PT End of Session - 01/05/15 1300    Visit Number 1   Number of Visits 16   Date for PT Re-Evaluation 03/03/15   Authorization Type 1   Authorization Time Period 10   PT Start Time 1210   PT Stop Time 1245   PT Time Calculation (min) 35 min   Activity Tolerance Patient tolerated treatment well   Behavior During Therapy [facility] for tasks assessed/performed      History reviewed. No pertinent past medical history.  History reviewed. No pertinent past surgical history.  There were no vitals filed for this visit.  Visit Diagnosis:  Right shoulder pain - Plan: PT plan of care cert/re-cert  Muscle weakness of right upper extremity - Plan: PT plan of care cert/re-cert      Subjective Assessment - 01/05/15 1218    Subjective Patient reports she has chief complaints of pain and weakness in right shoulder and cannot reach or lift without pain in right UE.    Pertinent History Patient fell in November 2014 injuring her right shoulder initially. She underwent surgery for subacromial decompression, acromion re section and rotator cuff repair for a large tear involving infraspinatus and supraspinatus 09/24/2014 and then fell in March 2016 following surgery with re injury to rotator cuff. She recently underwent  shoulder arthrosopic debridement 12/24/2014 and is now returning for continued physical therapy without restrictions for pain control and strengthening.    Limitations Lifting;House hold activities   Patient Stated Goals to return to prior level of function for using right arm   Currently in Pain? Yes   Pain Score 6    Pain Location Shoulder   Pain Orientation Right   Pain Descriptors / Indicators Aching;Sharp;Spasm   Pain Type Acute pain;Surgical pain   Pain Onset 1 to 4 weeks ago   Pain Frequency Constant   Pain Relieving Factors ice, rest   Effect of Pain on Daily Activities unable to use right UE for most tasks due to pain   Multiple Pain Sites No            [facility] PT Assessment - 01/05/15 0001    Assessment   Medical Diagnosis right shoulder arthroscopy   Onset Date/Surgical Date 12/24/14   Hand Dominance Right   Precautions   Precautions None   Balance Screen   Has the patient fallen in the past 6 months No   Has the patient had a decrease in activity level because of a fear of falling?  No   Is the patient reluctant to leave their home because of a fear of falling?  No   Prior Function   Level of Independence Independent   Observation/Other Assessments   Observations holding right arm at side, + hiking right shoulder   Quick DASH  62% impaired     AROM: right shoulder in sitting 0-120, decreased IR behind back AAROM: right shoulder in supine lying: forward elevation able to attain close to full ROM with pain, ER/IR WFL with pain Strength: not formally tested due to pain and recent surgery, + weakness noted throughout right UE shoulder, elbow and forearm, decreased control of scapula rhomboids, lower trapezius Palpation: +  spasms posterior aspect of right shoulder/lats. Upper trapezius        PT Education - 01-29-15 1300    Education provided Yes   Education Details home program as outlined, use of ice for pain control   Person(s) Educated Patient   Methods Explanation;Demonstration;Verbal cues   Comprehension Verbalized understanding;Returned demonstration;Verbal cues required             PT Long Term Goals - January 29, 2015 1300    PT LONG TERM GOAL #1   Title Patient will improve quicDASH to 50% or better demonstraing improvement with self perceived disability with ADL's by 01/29/2015   Baseline  quickDash 62%   Status New   PT LONG TERM GOAL #2   Title Patient will report decreased pain level to 3/10 max in right shoulder with reaching activities to allow her to perform hair care and reach into cabinets by 01/29/2015    Baseline Pain level 6/10   Status New   PT LONG TERM GOAL #3   Title Patient will improve quickDASH to 30% or better indicating improved self perceived disabilty with using right UE by 03/03/2015   Baseline 62%    Status New   PT LONG TERM GOAL #4   Title Patient will be independent with home program for pain control and self management of symptoms for right UE without cuing by 03/03/2015   Baseline limited knowledge of appropriate pain control strategies and exercise progression   Status New               Plan - 2015/01/29 1303    Clinical Impression Statement Patient is a right hand dominant 69 yo female who presents s/p debridement of recurrent rotator cuff tear. She has pain and weakness in right UE that are limiting her with daily activties for personal care and household chores and she will benefit from physical therapy intervention to address limitations in order to return to independent function using right UE.    Pt will benefit from skilled therapeutic intervention in order to improve on the following deficits Decreased strength;Increased muscle spasms;Impaired flexibility;Impaired UE functional use;Pain;Decreased range of motion   Rehab Potential Good   Clinical Impairments Affecting Rehab Potential recurrent rotator cuff tear, previous surgery within the past 6 months   PT Frequency 2x / week   PT Duration 8 weeks   PT Treatment/Interventions Manual techniques;Cryotherapy;Therapeutic exercise;Electrical Stimulation;Scar mobilization;Passive range of motion;Moist Heat;Ultrasound   PT Next Visit Plan pain control, manual therapy techniques, ther. ex.   PT Home Exercise Plan as outlined previously   Consulted and Agree with Plan of Care Patient           G-Codes - January 29, 2015 1310    Functional Assessment Tool Used QuickDASH, ROM, strenght deficits, clinical judgment, pain scale   Functional Limitation Carrying, moving and handling objects   Carrying, Moving and Handling Objects Current Status (K1601) At least 40 percent but less than 60 percent impaired, limited or restricted   Carrying, Moving and Handling Objects Goal Status (U9323) At least 20 percent but less than 40 percent impaired, limited or restricted       Problem List Patient Active Problem List   Diagnosis Date Noted  . HYPERLIPIDEMIA 06/21/2009  . HYPERTENSION 06/21/2009  . 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 FEVER UNSPECIFIED 06/21/2009  . NAUSEA 06/21/2009  . DYSPHAGIA 06/21/2009  . RUQ PAIN 06/21/2009  . HIATAL HERNIA 01/07/2008  . HYPOTHYROIDISM 11/22/2007  . ANXIETY 11/22/2007  . DEPRESSION 11/22/2007  . HYPERTENSIVE CARDIOVASCULAR  DISEASE 11/22/2007  . 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 RENAL CALCULUS 11/22/2007  . DEGENERATIVE JOINT DISEASE 11/22/2007  . SLEEP APNEA 11/22/2007  . HEADACHE, CHRONIC 11/22/2007  . GASTRITIS 10/23/2007  . DIVERTICULOSIS, COLON 10/23/2007  . DIABETES MELLITUS 07/16/2007  . HYPERCHOLESTEROLEMIA 07/16/2007  . OBESITY, MODERATE 07/16/2007  . ISCHEMIC HEART DISEASE 07/16/2007  . ESOPHAGEAL REFLUX 07/16/2007  . SCLERODERMA 07/16/2007  . DYSPNEA 07/16/2007  . COUGH, CHRONIC 07/16/2007    Jomarie Longs PT 01/06/2015, 2:25 PM  [facility] [address] [place], [address] Phone: [phone]   Fax:  [phone]

## 2015-01-08 ENCOUNTER — Ambulatory Visit: Payer: PPO | Attending: Orthopaedic Surgery | Admitting: Physical Therapy

## 2015-01-08 ENCOUNTER — Encounter: Payer: Self-pay | Admitting: Physical Therapy

## 2015-01-08 DIAGNOSIS — M6281 Muscle weakness (generalized): Secondary | ICD-10-CM | POA: Diagnosis present

## 2015-01-08 DIAGNOSIS — M25511 Pain in right shoulder: Secondary | ICD-10-CM | POA: Diagnosis present

## 2015-01-08 NOTE — Therapy (Signed)
[facility] [address], [place], [address] Phone: [phone]   Fax:  [phone]  Physical Therapy Treatment  Patient Details  Name: Teresa [name] MRN: [id number] Date of Birth: September 01, 1945 Referring Provider:  Melrose Nakayama, MD  Encounter Date: 01/08/2015      PT End of Session - 01/08/15 0917    Visit Number 2   Number of Visits 16   Date for PT Re-Evaluation 03/03/15   Authorization Type 2   Authorization Time Period 10   PT Start Time 0910   PT Stop Time 0950   PT Time Calculation (min) 40 min   Activity Tolerance Patient tolerated treatment well;Patient limited by pain   Behavior During Therapy [facility] for tasks assessed/performed      History reviewed. No pertinent past medical history.  History reviewed. No pertinent past surgical history.  There were no vitals filed for this visit.  Visit Diagnosis:  Right shoulder pain  Muscle weakness of right upper extremity      Subjective Assessment - 01/08/15 0909    Subjective Patient reports she has chief complaints of pain and weakness in right shoulder and cannot reach or lift without pain in right UE.    Limitations Lifting;House hold activities   Patient Stated Goals to return to prior level of function for using right arm   Currently in Pain? Yes   Pain Score 7    Pain Location Shoulder   Pain Orientation Right   Pain Descriptors / Indicators Aching;Sharp;Spasm  Note: hand goes numb intermittenlty   Pain Type Acute pain;Surgical pain   Pain Onset 1 to 4 weeks ago   Pain Frequency Constant   Pain Relieving Factors ice, rest   Multiple Pain Sites No         [facility] Adult PT Treatment/Exercise - 01/08/15 1221    Exercises   Exercises Other Exercises   Other Exercises  supine lying: AAROM through full ROM right shoulder flexion, ER/IR x 5 reps, rhythmic stabilization for right shoulder with 90 degrees elevation 3 sets x 10 reps, ER/IR  with right UE in neutral at side 2 sets x 10 reps, instructed in isometric exercises at doorwary with towel flexion ER and IR, instructed in use of pulleys for home, patient performed with verbal cues for correct alignment of shoulder to avoied impingement of right shoulder   Modalities   Modalities Electrical Stimulation;Cryotherapy   Cryotherapy   Number Minutes Cryotherapy 20 Minutes   Cryotherapy Location Shoulder   Type of Cryotherapy Ice pack   Electrical Stimulation   Electrical Stimulation Location right shoulder   Electrical Stimulation Parameters high volt (2) electrodes applied to anterior and posterior aspect of shoulder, Russian stim. 10/10 cycle applied along medial border of scapula, with patient in seated positin with right UE supported   Electrical Stimulation Goals Neuromuscular facilitation;Pain     Response to treatment: decreased pain, improved mobility of shoulder with less difficulty raising above shoulder level during exercises, required assistance, verbal cuing throughout treatment session           PT Education - 01/08/15 0914    Education provided Yes   Education Details Patient instructed in pain control strategies and exercises including scapular control, pendulums, use of pulleys for home and new exercises isometric shoulder flexion/ER/IR at doorway   Person(s) Educated Patient   Methods Explanation;Demonstration;Verbal cues   Comprehension Verbalized understanding;Returned demonstration;Verbal cues required  PT Long Term Goals - 01/05/15 1300    PT LONG TERM GOAL #1   Title Patient will improve quicDASH to 50% or better demonstraing improvement with self perceived disability with ADL's by 01/29/2015   Baseline quickDash 62%   Status New   PT LONG TERM GOAL #2   Title Patient will report decreased pain level to 3/10 max in right shoulder with reaching activities to allow her to perform hair care and reach into cabinets by 01/29/2015     Baseline Pain level 6/10   Status New   PT LONG TERM GOAL #3   Title Patient will improve quickDASH to 30% or better indicating improved self perceived disabilty with using right UE by 03/03/2015   Baseline 62%    Status New   PT LONG TERM GOAL #4   Title Patient will be independent with home program for pain control and self management of symptoms for right UE without cuing by 03/03/2015   Baseline limited knowledge of appropriate pain control strategies and exercise progression   Status New               Plan - 01/08/15 1229    Clinical Impression Statement Patient tolerated treatment with less pain than the previous session. She continues with pain as  primary limiting factor and will require continued physical therapy intervention to progress ROM and strengthening exercises in order to return to full functional use of right UE.    Pt will benefit from skilled therapeutic intervention in order to improve on the following deficits Decreased strength;Increased muscle spasms;Impaired flexibility;Impaired UE functional use;Pain;Decreased range of motion   Rehab Potential Good   Clinical Impairments Affecting Rehab Potential recurrent rotator cuff tear, previous surgery within the past 6 months   PT Frequency 2x / week   PT Duration 8 weeks   PT Treatment/Interventions Manual techniques;Cryotherapy;Therapeutic exercise;Electrical Stimulation;Scar mobilization;Passive range of motion;Moist Heat;Ultrasound   PT Next Visit Plan pain control, manual therapy techniques, ther. ex.        Problem List Patient Active Problem List   Diagnosis Date Noted  . HYPERLIPIDEMIA 06/21/2009  . HYPERTENSION 06/21/2009  . 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 FEVER UNSPECIFIED 06/21/2009  . NAUSEA 06/21/2009  . DYSPHAGIA 06/21/2009  . RUQ PAIN 06/21/2009  . HIATAL HERNIA 01/07/2008  . HYPOTHYROIDISM 11/22/2007  . ANXIETY 11/22/2007  . DEPRESSION 11/22/2007  . HYPERTENSIVE CARDIOVASCULAR  DISEASE 11/22/2007  . 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 RENAL CALCULUS 11/22/2007  . DEGENERATIVE JOINT DISEASE 11/22/2007  . SLEEP APNEA 11/22/2007  . HEADACHE, CHRONIC 11/22/2007  . GASTRITIS 10/23/2007  . DIVERTICULOSIS, COLON 10/23/2007  . DIABETES MELLITUS 07/16/2007  . HYPERCHOLESTEROLEMIA 07/16/2007  . OBESITY, MODERATE 07/16/2007  . ISCHEMIC HEART DISEASE 07/16/2007  . ESOPHAGEAL REFLUX 07/16/2007  . SCLERODERMA 07/16/2007  . DYSPNEA 07/16/2007  . COUGH, CHRONIC 07/16/2007    Jomarie Longs PT 01/08/2015, 12:32 PM  [facility] [address] [place], [address] Phone: [phone]   Fax:  [phone]

## 2015-01-12 ENCOUNTER — Encounter: Payer: Self-pay | Admitting: Physical Therapy

## 2015-01-12 ENCOUNTER — Ambulatory Visit: Payer: PPO | Admitting: Physical Therapy

## 2015-01-12 DIAGNOSIS — M6281 Muscle weakness (generalized): Secondary | ICD-10-CM

## 2015-01-12 DIAGNOSIS — M25511 Pain in right shoulder: Secondary | ICD-10-CM | POA: Diagnosis not present

## 2015-01-12 NOTE — Therapy (Signed)
[facility] [address], [place], [address] Phone: [phone]   Fax:  [phone]  Physical Therapy Treatment  Patient Details  Name: Teresa [name] MRN: [id number] Date of Birth: [birth year] Referring Provider:  Melrose Nakayama, MD  Encounter Date: 01/12/2015      PT End of Session - 01/12/15 1143    Visit Number 3   Number of Visits 16   Date for PT Re-Evaluation 03/03/15   Authorization Type 3   Authorization Time Period 10   PT Start Time 1047   PT Stop Time 1130   PT Time Calculation (min) 43 min   Activity Tolerance Patient limited by pain   Behavior During Therapy [facility] for tasks assessed/performed      History reviewed. No pertinent past medical history.  History reviewed. No pertinent past surgical history.  There were no vitals filed for this visit.  Visit Diagnosis:  Right shoulder pain  Muscle weakness of right upper extremity      Subjective Assessment - 01/12/15 1050    Subjective Patient reports she is still having pain and weakness in right shoulder and cannot reachor lift without pain in right UE. Her sleep is disturbed and pain wakes up 3-4x/night.    Limitations Lifting;House hold activities   How long can you walk comfortably?     Currently in Pain? Yes   Pain Score 8    Pain Location Shoulder   Pain Orientation Right   Pain Descriptors / Indicators Aching  right hand goes numb intermittenlty  as well   Pain Type Acute pain;Surgical pain   Pain Onset 1 to 4 weeks ago   Pain Frequency Constant   Pain Relieving Factors ice, rest   Effect of Pain on Daily Activities unable to use right UE for most tasks due to pain   Multiple Pain Sites No    Objective:  Posture: scapula left higher than right, + atrophy of rhomboids and lower trapezius muscle right, forward rounded shoulder with guarded posture right shoulder       [facility] Adult PT Treatment/Exercise - 01/12/15 1055    Exercises   Exercises Other Exercises   Other Exercises  UE ranger in standing short arc of motion for forward flexion and IR and ER 2 x 30 seconds right UE with tactile and verbal cuing for correct alignment of shoulder/scapula, Seated isometric exercises with assistance of therapist for manual resistance (mild) to avoid increased pain   Modalities   Modalities Electrical Stimulation;Cryotherapy   Cryotherapy   Cryotherapy Location Shoulder   Electrical Stimulation   Electrical Stimulation Location right shoulder   Electrical Stimulation Parameters high volt (2) electrodes applied to anterior lateral aspect of right shoulder and upper trapezius muscle, russian stim. 10/10 cycl e applied along medial border of scapula, with patient seated with right UE supported intensity to contraction   Electrical Stimulation Goals Neuromuscular facilitation;Pain     Manual therapy: STM right shoulder along right side medial border of scapula and upper trapezius muscle with patient seated   Patient response to treatment: improved activation of muscles during isometric exercises with verbal cuing and graded manual resistance, required tactile and verbal cuing to maintain correct alignment of shoulder and scapula throughout session, increased pain with all exercises, reported reproduction of numbness in right hand with forward elevation and isometric flexion exercises therefore limited reps           PT Education - 01/12/15 1142  Education provided Yes   Education Details Instructed patient to perform exercises in pain free ranges, short arcs and not to aggravate her pain.    Person(s) Educated Patient   Methods Explanation;Demonstration;Verbal cues   Comprehension Verbalized understanding;Returned demonstration;Verbal cues required             PT Long Term Goals - 01/05/15 1300    PT LONG TERM GOAL #1   Title Patient will improve quicDASH to 50% or better demonstraing improvement with self  perceived disability with ADL's by 01/29/2015   Baseline quickDash 62%   Status New   PT LONG TERM GOAL #2   Title Patient will report decreased pain level to 3/10 max in right shoulder with reaching activities to allow her to perform hair care and reach into cabinets by 01/29/2015    Baseline Pain level 6/10   Status New   PT LONG TERM GOAL #3   Title Patient will improve quickDASH to 30% or better indicating improved self perceived disabilty with using right UE by 03/03/2015   Baseline 62%    Status New   PT LONG TERM GOAL #4   Title Patient will be independent with home program for pain control and self management of symptoms for right UE without cuing by 03/03/2015   Baseline limited knowledge of appropriate pain control strategies and exercise progression   Status New               Plan - 01/12/15 1144    Clinical Impression Statement Patient has exacerbated symptoms in right UE/shoulder with incresaed exercise at home and had to modify exercises today to be able to complete session. She continues with pain as her prmary limiting factor and will require continued physical therapy interveniton to progress ROM with decreased pain in order go be able to gain strength for funcitonal use for personal care and daily activities.    Pt will benefit from skilled therapeutic intervention in order to improve on the following deficits Decreased strength;Increased muscle spasms;Impaired flexibility;Impaired UE functional use;Pain;Decreased range of motion   Rehab Potential Good   Clinical Impairments Affecting Rehab Potential recurrent rotator cuff tear, previous surgery within the past 6 months   PT Frequency 2x / week   PT Duration 8 weeks   PT Treatment/Interventions Manual techniques;Cryotherapy;Therapeutic exercise;Electrical Stimulation;Scar mobilization;Passive range of motion;Moist Heat;Ultrasound   PT Next Visit Plan pain control, manual therapy techniques, ther. ex.   PT Home Exercise  Plan modified exercises to short arc to avoid pain, progress as indicated        Problem List Patient Active Problem List   Diagnosis Date Noted  . HYPERLIPIDEMIA 06/21/2009  . HYPERTENSION 06/21/2009  . 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 FEVER UNSPECIFIED 06/21/2009  . NAUSEA 06/21/2009  . DYSPHAGIA 06/21/2009  . RUQ PAIN 06/21/2009  . HIATAL HERNIA 01/07/2008  . HYPOTHYROIDISM 11/22/2007  . ANXIETY 11/22/2007  . DEPRESSION 11/22/2007  . HYPERTENSIVE CARDIOVASCULAR DISEASE 11/22/2007  . 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 RENAL CALCULUS 11/22/2007  . DEGENERATIVE JOINT DISEASE 11/22/2007  . SLEEP APNEA 11/22/2007  . HEADACHE, CHRONIC 11/22/2007  . GASTRITIS 10/23/2007  . DIVERTICULOSIS, COLON 10/23/2007  . DIABETES MELLITUS 07/16/2007  . HYPERCHOLESTEROLEMIA 07/16/2007  . OBESITY, MODERATE 07/16/2007  . ISCHEMIC HEART DISEASE 07/16/2007  . ESOPHAGEAL REFLUX 07/16/2007  . SCLERODERMA 07/16/2007  . DYSPNEA 07/16/2007  . COUGH, CHRONIC 07/16/2007    Jomarie Longs PT 01/12/2015, 11:48 AM  [facility] [address], [place], [address] Phone: [phone]   Fax:  [phone]

## 2015-01-13 ENCOUNTER — Ambulatory Visit: Payer: PPO | Admitting: Physical Therapy

## 2015-01-13 ENCOUNTER — Encounter: Payer: Self-pay | Admitting: Physical Therapy

## 2015-01-13 DIAGNOSIS — M6281 Muscle weakness (generalized): Secondary | ICD-10-CM

## 2015-01-13 DIAGNOSIS — M25511 Pain in right shoulder: Secondary | ICD-10-CM | POA: Diagnosis not present

## 2015-01-13 NOTE — Therapy (Signed)
[facility] [address], [place], [address] Phone: [phone]   Fax:  [phone]  Physical Therapy Treatment  Patient Details  Name: Teresa [name] MRN: [id number] Date of Birth: [birth year] Referring Provider:  Melrose Nakayama, MD  Encounter Date: 01/13/2015      PT End of Session - 01/13/15 1052    Visit Number 4   Number of Visits 16   Date for PT Re-Evaluation 03/03/15   Authorization Type 4   Authorization Time Period 10   PT Start Time 1000   PT Stop Time 1040   PT Time Calculation (min) 40 min   Activity Tolerance Patient limited by pain   Behavior During Therapy [facility] for tasks assessed/performed      History reviewed. No pertinent past medical history.  History reviewed. No pertinent past surgical history.  There were no vitals filed for this visit.  Visit Diagnosis:  Right shoulder pain  Muscle weakness of right upper extremity      Subjective Assessment - 01/13/15 1006    Subjective More pain today than yesterday, unable to move right arm due to pain   Currently in Pain? Yes   Pain Score 8    Pain Location Shoulder   Pain Orientation Right   Pain Descriptors / Indicators Aching;Constant   Pain Type Acute pain;Surgical pain   Pain Onset 1 to 4 weeks ago   Pain Frequency Constant   Aggravating Factors  moving right arm   Pain Relieving Factors ice, rest   Multiple Pain Sites No              [facility] Adult PT Treatment/Exercise - 01/13/15 1025               Modalities   Modalities Electrical Stimulation;Cryotherapy;Ultrasound   Cryotherapy   Cryotherapy Location Shoulder, right with estim. X 30 min.   Acupuncturist Location right shoulder   Electrical Stimulation Parameters high volt (2) electrodes applied to anterior and posterior aspect of right shoulder, Russian stim. 10/10 cycle (2) electrodes applied to medial border of right  scapula and lower trapezius muscle with patient seated and right UE supported, intensity to contraction   Electrical Stimulation Goals Neuromuscular facilitation;Pain   Ultrasound   Ultrasound Location shoulder  right   Ultrasound Parameters 1MHz 50% pulsed @ 1.4w/cm2   Ultrasound Goals Pain     patient response: decreased pain to 4-5/10 and able to put shirt back on with less difficulty. She verbalizes understanding of rest and ice to decreased pain            PT Education - 01/13/15 1044    Education provided Yes   Education Details instructed to rest and ice shoulder more and not to use right UE for lifting, reaching for now             PT Long Term Goals - 01/05/15 1300    PT LONG TERM GOAL #1   Title Patient will improve quicDASH to 50% or better demonstraing improvement with self perceived disability with ADL's by 01/29/2015   Baseline quickDash 62%   Status New   PT LONG TERM GOAL #2   Title Patient will report decreased pain level to 3/10 max in right shoulder with reaching activities to allow her to perform hair care and reach into cabinets by 01/29/2015    Baseline Pain level 6/10   Status New   PT LONG TERM GOAL #  3   Title Patient will improve quickDASH to 30% or better indicating improved self perceived disabilty with using right UE by 03/03/2015   Baseline 62%    Status New   PT LONG TERM GOAL #4   Title Patient will be independent with home program for pain control and self management of symptoms for right UE without cuing by 03/03/2015   Baseline limited knowledge of appropriate pain control strategies and exercise progression   Status New               Plan - 01/13/15 1053    Clinical Impression Statement Patient arrived in clinic with increased pain in right shoulder. She reports she is using sling at home because of the pain. She demonstrated decreased symptoms with treatment and should continue to improve with self management at home with  ice/rest.   Pt will benefit from skilled therapeutic intervention in order to improve on the following deficits Decreased strength;Increased muscle spasms;Impaired flexibility;Impaired UE functional use;Pain;Decreased range of motion   Rehab Potential Good   Clinical Impairments Affecting Rehab Potential recurrent rotator cuff tear, previous surgery within the past 6 months   PT Frequency 2x / week   PT Duration 8 weeks   PT Treatment/Interventions Manual techniques;Cryotherapy;Therapeutic exercise;Electrical Stimulation;Scar mobilization;Passive range of motion;Moist Heat;Ultrasound   PT Next Visit Plan pain control, manual therapy techniques, ther. ex.        Problem List Patient Active Problem List   Diagnosis Date Noted  . HYPERLIPIDEMIA 06/21/2009  . HYPERTENSION 06/21/2009  . 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 FEVER UNSPECIFIED 06/21/2009  . NAUSEA 06/21/2009  . DYSPHAGIA 06/21/2009  . RUQ PAIN 06/21/2009  . HIATAL HERNIA 01/07/2008  . HYPOTHYROIDISM 11/22/2007  . ANXIETY 11/22/2007  . DEPRESSION 11/22/2007  . HYPERTENSIVE CARDIOVASCULAR DISEASE 11/22/2007  . 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 RENAL CALCULUS 11/22/2007  . DEGENERATIVE JOINT DISEASE 11/22/2007  . SLEEP APNEA 11/22/2007  . HEADACHE, CHRONIC 11/22/2007  . GASTRITIS 10/23/2007  . DIVERTICULOSIS, COLON 10/23/2007  . DIABETES MELLITUS 07/16/2007  . HYPERCHOLESTEROLEMIA 07/16/2007  . OBESITY, MODERATE 07/16/2007  . ISCHEMIC HEART DISEASE 07/16/2007  . ESOPHAGEAL REFLUX 07/16/2007  . SCLERODERMA 07/16/2007  . DYSPNEA 07/16/2007  . COUGH, CHRONIC 07/16/2007    Jomarie Longs PT 01/13/2015, 11:10 AM  [facility] [address] [place], [address] Phone: [phone]   Fax:  [phone]

## 2015-01-19 ENCOUNTER — Ambulatory Visit: Payer: PPO | Admitting: Physical Therapy

## 2015-01-19 ENCOUNTER — Encounter: Payer: Self-pay | Admitting: Physical Therapy

## 2015-01-19 DIAGNOSIS — M6281 Muscle weakness (generalized): Secondary | ICD-10-CM

## 2015-01-19 DIAGNOSIS — M25511 Pain in right shoulder: Secondary | ICD-10-CM

## 2015-01-19 NOTE — Therapy (Signed)
[facility] [address], [place], [address] Phone: [phone]   Fax:  [phone]  Physical Therapy Treatment  Patient Details  Name: Teresa [name] MRN: [id number] Date of Birth: 1946-04-19 Referring Provider:  Melrose Nakayama, MD  Encounter Date: 01/19/2015      PT End of Session - 01/19/15 0926    Visit Number 5   Number of Visits 16   Date for PT Re-Evaluation 03/03/15   Authorization Type 5   Authorization Time Period 10   PT Start Time [phone]   PT Stop Time 0945   PT Time Calculation (min) 49 min   Activity Tolerance Patient tolerated treatment well;Patient limited by pain   Behavior During Therapy [facility] for tasks assessed/performed      History reviewed. No pertinent past medical history.  History reviewed. No pertinent past surgical history.  There were no vitals filed for this visit.  Visit Diagnosis:  Right shoulder pain  Muscle weakness of right upper extremity      Subjective Assessment - 01/19/15 0857    Subjective Patient reports she may be a little bit better and that the [place] seemed to help with the pain. She is still not able to move her right arm well because of the pain.    Currently in Pain? Yes   Pain Score 7    Pain Location Shoulder   Pain Orientation Right   Pain Descriptors / Indicators Aching;Constant   Pain Type Acute pain;Surgical pain   Pain Onset 1 to 4 weeks ago   Pain Frequency Constant   Aggravating Factors  moving right arm or driving    Effect of Pain on Daily Activities unable to use right UE for most tasks due to increased pain in shoulder/UE   Multiple Pain Sites No      Objective: AAROM/PROM: right shoulder with patient in supine position: forward elevation, IR and ER all WNL's with pain (no pain if patient relaxed muscles) Strength: improving ER and forward elevation, decreased control in general for all shoulder and elbow movements       [facility]  Adult PT Treatment/Exercise - 01/19/15 0900    Exercises   Exercises Other Exercises   Other Exercises  supine lying: AAROM right shoulder flexion, ER/IR x 5 reps, rhythmic stabilization for right shoulder with 90 degrees elevation 1 set x 10 reps, ER off trunk  at side 2 sets x 5 reps with isometric hold at end range and controlled eccentric contraction/lowering with verbal and tactile cuing, re assessed PROM/AAROM right shoulder in supine lying with patient using left UE to assist right UE, performed chest press with punch with assistance of therapist 2 x 5 reps with controlled motion with monitoring of pain.    Modalities   Modalities Electrical Stimulation;Cryotherapy;Ultrasound   Cryotherapy   Number Minutes Cryotherapy 20 Minutes   Cryotherapy Location Shoulder   Electrical Stimulation   Electrical Stimulation Location right shoulder   Electrical Stimulation Parameters high volt electrodes applied to anterior and posterior aspect of shoulder, Russian stim. 10/10 cycle applied along medial border of scapula with patient in seated position with right UE supported   Electrical Stimulation Goals Neuromuscular facilitation;Pain   Ultrasound   Ultrasound Location right shoulder   Ultrasound Parameters 1MHz pulsed @ 50% 1.4w/cm2    Ultrasound Goals Pain   Manual Therapy   Manual Therapy Soft tissue mobilization   Manual therapy comments superficial techniques for pain control and to improve  elasticity   Soft tissue mobilization right shoulder/upper trapezius muscles with patient seated/supine lying      Patient response to treatment: improved AAROM/PROM with less difficulty and pain following instruction and with verbal cuing, patient responding with decreased reports of pain to 4/10 with [place] and estim. Modalities as well          PT Education - 01/19/15 1441    Education provided Yes   Education Details Instructed in proper performance of exercises in supine position for ER, forward  elevation using left UE for support and how to decrease pain by relaxing right shoulder muscles    Person(s) Educated Patient   Methods Explanation;Demonstration;Tactile cues;Verbal cues   Comprehension Verbalized understanding;Returned demonstration;Verbal cues required             PT Long Term Goals - 01/05/15 1300    PT LONG TERM GOAL #1   Title Patient will improve quicDASH to 50% or better demonstraing improvement with self perceived disability with ADL's by 01/29/2015   Baseline quickDash 62%   Status New   PT LONG TERM GOAL #2   Title Patient will report decreased pain level to 3/10 max in right shoulder with reaching activities to allow her to perform hair care and reach into cabinets by 01/29/2015    Baseline Pain level 6/10   Status New   PT LONG TERM GOAL #3   Title Patient will improve quickDASH to 30% or better indicating improved self perceived disabilty with using right UE by 03/03/2015   Baseline 62%    Status New   PT LONG TERM GOAL #4   Title Patient will be independent with home program for pain control and self management of symptoms for right UE without cuing by 03/03/2015   Baseline limited knowledge of appropriate pain control strategies and exercise progression   Status New               Plan - 01/19/15 0927    Clinical Impression Statement Patient was able to tolerate treatment with less pain today than previous session. She is progressing slowly towards goals due to pain in right shoullder. She should continue to improve strength and ROM with continued physical therpay intervention for pain control and guided exercise for improving ROM and strength in order to return to prior level of function.    Pt will benefit from skilled therapeutic intervention in order to improve on the following deficits Decreased strength;Increased muscle spasms;Impaired flexibility;Impaired UE functional use;Pain;Decreased range of motion   Rehab Potential Good   PT Frequency  2x / week   PT Duration 8 weeks   PT Treatment/Interventions Manual techniques;Cryotherapy;Therapeutic exercise;Electrical Stimulation;Scar mobilization;Passive range of motion;Moist Heat;Ultrasound   PT Next Visit Plan pain control, manual therapy techniques, ther. ex.        Problem List Patient Active Problem List   Diagnosis Date Noted  . HYPERLIPIDEMIA 06/21/2009  . HYPERTENSION 06/21/2009  . 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 FEVER UNSPECIFIED 06/21/2009  . NAUSEA 06/21/2009  . DYSPHAGIA 06/21/2009  . RUQ PAIN 06/21/2009  . HIATAL HERNIA 01/07/2008  . HYPOTHYROIDISM 11/22/2007  . ANXIETY 11/22/2007  . DEPRESSION 11/22/2007  . HYPERTENSIVE CARDIOVASCULAR DISEASE 11/22/2007  . 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 RENAL CALCULUS 11/22/2007  . DEGENERATIVE JOINT DISEASE 11/22/2007  . SLEEP APNEA 11/22/2007  . HEADACHE, CHRONIC 11/22/2007  . GASTRITIS 10/23/2007  . DIVERTICULOSIS, COLON 10/23/2007  . DIABETES MELLITUS 07/16/2007  . HYPERCHOLESTEROLEMIA 07/16/2007  . OBESITY, MODERATE 07/16/2007  . ISCHEMIC HEART  DISEASE 07/16/2007  . ESOPHAGEAL REFLUX 07/16/2007  . SCLERODERMA 07/16/2007  . DYSPNEA 07/16/2007  . COUGH, CHRONIC 07/16/2007    Jomarie Longs PT 01/19/2015, 2:46 PM  [facility] [address] [place], [address] Phone: [phone]   Fax:  [phone]

## 2015-01-20 ENCOUNTER — Other Ambulatory Visit: Payer: Self-pay | Admitting: Family Medicine

## 2015-01-20 DIAGNOSIS — M109 Gout, unspecified: Secondary | ICD-10-CM

## 2015-01-20 DIAGNOSIS — I10 Essential (primary) hypertension: Secondary | ICD-10-CM

## 2015-01-20 DIAGNOSIS — E039 Hypothyroidism, unspecified: Secondary | ICD-10-CM

## 2015-01-20 DIAGNOSIS — E118 Type 2 diabetes mellitus with unspecified complications: Secondary | ICD-10-CM

## 2015-01-21 ENCOUNTER — Ambulatory Visit: Payer: PPO | Admitting: Physical Therapy

## 2015-01-21 ENCOUNTER — Encounter: Payer: Self-pay | Admitting: Physical Therapy

## 2015-01-21 DIAGNOSIS — M25511 Pain in right shoulder: Secondary | ICD-10-CM

## 2015-01-21 DIAGNOSIS — M6281 Muscle weakness (generalized): Secondary | ICD-10-CM

## 2015-01-21 NOTE — Therapy (Signed)
[facility] [address], [place], [address] Phone: [phone]   Fax:  [phone]  Physical Therapy Treatment  Patient Details  Name: Teresa [name] MRN: [id number] Date of Birth: 07-Aug-1946 Referring Provider:  Melrose Nakayama, MD  Encounter Date: 01/21/2015      PT End of Session - 01/21/15 1130    Visit Number 6   Number of Visits 16   Date for PT Re-Evaluation 03/03/15   Authorization Type 6   Authorization Time Period 10   PT Start Time 1045   PT Stop Time 1128   PT Time Calculation (min) 43 min   Activity Tolerance Patient tolerated treatment well;Patient limited by pain   Behavior During Therapy [facility] for tasks assessed/performed      History reviewed. No pertinent past medical history.  History reviewed. No pertinent past surgical history.  There were no vitals filed for this visit.  Visit Diagnosis:  Right shoulder pain  Muscle weakness of right upper extremity      Subjective Assessment - 01/21/15 1048    Subjective Patient reports she may be a little bit better and that the [place] seemed to help with the pain. She is still not able to move her right arm well because of the pain. She was seen by MD yesterday and is to continue with therapy for strengthening and pain control   Currently in Pain? Yes   Pain Score 7    Pain Location Shoulder   Pain Descriptors / Indicators Aching;Constant   Pain Type Acute pain;Surgical pain   Pain Onset 1 to 4 weeks ago   Pain Frequency Constant   Multiple Pain Sites No          [facility] Adult PT Treatment/Exercise - 01/21/15 1049    Exercises   Exercises Other Exercises   Other Exercises  supine lying: AAROM right shoulder flexion, ER/IR x 5 reps, rhythmic stabilization for right shoulder with 90 degrees elevation 1 set x 10 reps, ER off trunk  at side 2 sets x 5 reps with isometric hold at end range and controlled eccentric contration with verbal and  tactile cuing, performed chest press with punch with assistance of therapist 2 x 10 reps with controlled motion with monitoring of pain.    Modalities   Modalities Electrical Stimulation;Cryotherapy;Ultrasound   Cryotherapy   Cryotherapy Location  Right Shoulder: ice pack   Electrical Stimulation   Electrical Stimulation Location right shoulder   Electrical Stimulation Parameters high volt (2) electrodes applied to anterior and posterior aspect of shoulder, Russian stim. 10/10 cycle sapplied along medial border of scapula, with patient in seated position with right UE supported   Electrical Stimulation Goals Neuromuscular facilitation;Pain          Manual Therapy   Manual Therapy Soft tissue mobilization   Manual therapy comments superficial techniques for pain control and to improve elasticity   Soft tissue mobilization right shoulder/upper trapezius muscles with patient seated/supine lying      Patient response to treatment: pain in right shoulder with all motions, decreased as compared to previous session, required assistance and verbal cuing to relax and perform exercises with good alignment and decreased hiking of shoulder, pain level about the same, no worse at end of session, better following ice and estim.          PT Education - 01/21/15 1932    Education provided Yes   Education Details re assessed home exercises and pain  control strategies   Person(s) Educated Patient   Methods Explanation   Comprehension Verbalized understanding             PT Long Term Goals - 01/05/15 1300    PT LONG TERM GOAL #1   Title Patient will improve quicDASH to 50% or better demonstraing improvement with self perceived disability with ADL's by 01/29/2015   Baseline quickDash 62%   Status New   PT LONG TERM GOAL #2   Title Patient will report decreased pain level to 3/10 max in right shoulder with reaching activities to allow her to perform hair care and reach into cabinets by  01/29/2015    Baseline Pain level 6/10   Status New   PT LONG TERM GOAL #3   Title Patient will improve quickDASH to 30% or better indicating improved self perceived disabilty with using right UE by 03/03/2015   Baseline 62%    Status New   PT LONG TERM GOAL #4   Title Patient will be independent with home program for pain control and self management of symptoms for right UE without cuing by 03/03/2015   Baseline limited knowledge of appropriate pain control strategies and exercise progression   Status New               Plan - 01/21/15 1136    Clinical Impression Statement Patient continues with primary complaint of pain in right shoulder. She is progressing with less pain and improving strength in right UE with current treatment.     Pt will benefit from skilled therapeutic intervention in order to improve on the following deficits Decreased strength;Increased muscle spasms;Impaired flexibility;Impaired UE functional use;Pain;Decreased range of motion   Rehab Potential Good   Clinical Impairments Affecting Rehab Potential recurrent rotator cuff tear, previous surgery within the past 6 months   PT Frequency 2x / week   PT Duration 8 weeks   PT Treatment/Interventions Manual techniques;Cryotherapy;Therapeutic exercise;Electrical Stimulation;Scar mobilization;Passive range of motion;Moist Heat;Ultrasound   PT Next Visit Plan pain control, manual therapy techniques, ther. ex.   PT Home Exercise Plan modified exercises to short arc to avoid pain, progress as indicated        Problem List Patient Active Problem List   Diagnosis Date Noted  . HYPERLIPIDEMIA 06/21/2009  . HYPERTENSION 06/21/2009  . 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 FEVER UNSPECIFIED 06/21/2009  . NAUSEA 06/21/2009  . DYSPHAGIA 06/21/2009  . RUQ PAIN 06/21/2009  . HIATAL HERNIA 01/07/2008  . HYPOTHYROIDISM 11/22/2007  . ANXIETY 11/22/2007  . DEPRESSION 11/22/2007  . HYPERTENSIVE CARDIOVASCULAR DISEASE  11/22/2007  . 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 RENAL CALCULUS 11/22/2007  . DEGENERATIVE JOINT DISEASE 11/22/2007  . SLEEP APNEA 11/22/2007  . HEADACHE, CHRONIC 11/22/2007  . GASTRITIS 10/23/2007  . DIVERTICULOSIS, COLON 10/23/2007  . DIABETES MELLITUS 07/16/2007  . HYPERCHOLESTEROLEMIA 07/16/2007  . OBESITY, MODERATE 07/16/2007  . ISCHEMIC HEART DISEASE 07/16/2007  . ESOPHAGEAL REFLUX 07/16/2007  . SCLERODERMA 07/16/2007  . DYSPNEA 07/16/2007  . COUGH, CHRONIC 07/16/2007    Jomarie Longs PT 01/21/2015, 7:43 PM  [facility] [address], [place], [address] Phone: [phone]   Fax:  [phone]

## 2015-01-22 ENCOUNTER — Encounter: Payer: PRIVATE HEALTH INSURANCE | Admitting: Physical Therapy

## 2015-01-26 ENCOUNTER — Encounter: Payer: Self-pay | Admitting: Physical Therapy

## 2015-01-26 ENCOUNTER — Ambulatory Visit: Payer: PPO | Admitting: Physical Therapy

## 2015-01-26 DIAGNOSIS — M25511 Pain in right shoulder: Secondary | ICD-10-CM

## 2015-01-26 DIAGNOSIS — M6281 Muscle weakness (generalized): Secondary | ICD-10-CM

## 2015-01-26 NOTE — Therapy (Signed)
[facility] [address], [place], [address] Phone: [phone]   Fax:  [phone]  Physical Therapy Treatment  Patient Details  Name: Teresa [name] MRN: [id number] Date of Birth: June 11, 1946 Referring Provider:  Melrose Nakayama, MD  Encounter Date: 01/26/2015      PT End of Session - 01/26/15 0946    Visit Number 7   Number of Visits 16   Date for PT Re-Evaluation 03/03/15   Authorization Type 7   Authorization Time Period 10   PT Start Time 0846   PT Stop Time 0946   PT Time Calculation (min) 60 min   Activity Tolerance Patient limited by pain   Behavior During Therapy [facility] for tasks assessed/performed      History reviewed. No pertinent past medical history.  History reviewed. No pertinent past surgical history.  There were no vitals filed for this visit.  Visit Diagnosis:  Right shoulder pain  Muscle weakness of right upper extremity      Subjective Assessment - 01/26/15 0848    Subjective Patient reports she is still having significant pain level in right shoulder and is not sleeping well. She has to sleep in her chair a lot.    Limitations Lifting;House hold activities   Patient Stated Goals to return to prior level of function for using right arm   Currently in Pain? Yes   Pain Score 8    Pain Location Shoulder   Pain Orientation Right   Pain Descriptors / Indicators Aching;Constant   Pain Type Acute pain;Surgical pain   Pain Onset More than a month ago   Pain Frequency Constant   Multiple Pain Sites No           [facility] Adult PT Treatment/Exercise - 01/26/15 0851    Exercises   Exercises Other Exercises   Other Exercises  standing exercises with tactile and verbal cuing: UE ranger for forward elevation and ER/IR with platform on floor x 10 reps each, standing with UE ranger on wall forward elevation x 10 with guidance/verbal cuing, scapular adduction at [facility] 5# with isometric  holding at full ROM and eccentric control with verbal and tactile cues with left UE assist, reverse chin up/lat pull downwith 10# at cable and instructed in use with red resistive band for home   Modalities   Modalities Electrical Stimulation;Cryotherapy;Ultrasound   Cryotherapy   Number Minutes Cryotherapy 20 Minutes   Cryotherapy Location Shoulder   Type of Cryotherapy Ice pack   Electrical Stimulation   Electrical Stimulation Location right shoulder   Electrical Stimulation Parameters high volt estim. continuous mode applied (2) electrodes to anterior/posterior aspect right shoulder and russian stim. applied along medial aspect of scapula and lower trapezius muscles with patient seated, 10/10 cycle, intensitity to contraction/tolerance x 20 min. with ice applied to shoulder during treatment   Electrical Stimulation Goals Neuromuscular facilitation;Pain   Ultrasound   Ultrasound Location right shoulder   Ultrasound Parameters 1MHz 50% pulsed @ 1.4w/cm2   Ultrasound Goals Pain   Manual Therapy   Manual Therapy --   Manual therapy comments --   Soft tissue mobilization --     Patient response to treatment: Patient reported no increased pain with scapular exercises and required verbal cues and tactile cues to perform with correct alignment in right shoulder. She demonstrated improved control and strength with repetition and reported decreased pain to 5/10 with [place] and electrical stimulation/ice treatment  PT Education - 01/26/15 0935    Education provided Yes   Education Details Patient instructed in exercises for home with resistive band scapula control/strengthening   Person(s) Educated Patient   Methods Explanation;Demonstration;Verbal cues   Comprehension Verbalized understanding;Returned demonstration;Verbal cues required             PT Long Term Goals - 01/05/15 1300    PT LONG TERM GOAL #1   Title Patient will improve quicDASH to 50% or better demonstraing  improvement with self perceived disability with ADL's by 01/29/2015   Baseline quickDash 62%   Status New   PT LONG TERM GOAL #2   Title Patient will report decreased pain level to 3/10 max in right shoulder with reaching activities to allow her to perform hair care and reach into cabinets by 01/29/2015    Baseline Pain level 6/10   Status New   PT LONG TERM GOAL #3   Title Patient will improve quickDASH to 30% or better indicating improved self perceived disabilty with using right UE by 03/03/2015   Baseline 62%    Status New   PT LONG TERM GOAL #4   Title Patient will be independent with home program for pain control and self management of symptoms for right UE without cuing by 03/03/2015   Baseline limited knowledge of appropriate pain control strategies and exercise progression   Status New               Plan - 01/26/15 0950    Clinical Impression Statement Patient demonsrates improvement with ROM and scapular control and strength with current treatment. She has prmary limitation of pain and will continue to benefit from physical therapy intervention to achieve full AROM and strength to return to prior level of function.    Pt will benefit from skilled therapeutic intervention in order to improve on the following deficits Decreased strength;Increased muscle spasms;Impaired flexibility;Impaired UE functional use;Pain;Decreased range of motion   Rehab Potential Good   Clinical Impairments Affecting Rehab Potential recurrent rotator cuff tear, previous surgery within the past 6 months   PT Frequency 2x / week   PT Duration 8 weeks   PT Treatment/Interventions Manual techniques;Cryotherapy;Therapeutic exercise;Electrical Stimulation;Scar mobilization;Passive range of motion;Moist Heat;Ultrasound   PT Next Visit Plan pain control, manual therapy techniques, ther. ex.   PT Home Exercise Plan added scapular control and strengthening exercises with resistive band        Problem  List Patient Active Problem List   Diagnosis Date Noted  . HYPERLIPIDEMIA 06/21/2009  . HYPERTENSION 06/21/2009  . 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 FEVER UNSPECIFIED 06/21/2009  . NAUSEA 06/21/2009  . DYSPHAGIA 06/21/2009  . RUQ PAIN 06/21/2009  . HIATAL HERNIA 01/07/2008  . HYPOTHYROIDISM 11/22/2007  . ANXIETY 11/22/2007  . DEPRESSION 11/22/2007  . HYPERTENSIVE CARDIOVASCULAR DISEASE 11/22/2007  . 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 RENAL CALCULUS 11/22/2007  . DEGENERATIVE JOINT DISEASE 11/22/2007  . SLEEP APNEA 11/22/2007  . HEADACHE, CHRONIC 11/22/2007  . GASTRITIS 10/23/2007  . DIVERTICULOSIS, COLON 10/23/2007  . DIABETES MELLITUS 07/16/2007  . HYPERCHOLESTEROLEMIA 07/16/2007  . OBESITY, MODERATE 07/16/2007  . ISCHEMIC HEART DISEASE 07/16/2007  . ESOPHAGEAL REFLUX 07/16/2007  . SCLERODERMA 07/16/2007  . DYSPNEA 07/16/2007  . COUGH, CHRONIC 07/16/2007    Jomarie Longs PT 01/26/2015, 2:00 PM  [facility] [address], [place], [address] Phone: [phone]   Fax:  [phone]

## 2015-01-29 ENCOUNTER — Ambulatory Visit: Payer: PPO | Admitting: Physical Therapy

## 2015-01-29 ENCOUNTER — Encounter: Payer: Self-pay | Admitting: Physical Therapy

## 2015-01-29 DIAGNOSIS — M25511 Pain in right shoulder: Secondary | ICD-10-CM

## 2015-01-29 DIAGNOSIS — M6281 Muscle weakness (generalized): Secondary | ICD-10-CM

## 2015-01-29 NOTE — Therapy (Signed)
[facility] [address], [place], [address] Phone: [phone]   Fax:  [phone]  Physical Therapy Treatment  Patient Details  Name: Teresa [name] MRN: [id number] Date of Birth: Aug 06, 1946 Referring Provider:  Melrose Nakayama, MD  Encounter Date: 01/29/2015      PT End of Session - 01/29/15 0854    Visit Number 8   Number of Visits 16   Date for PT Re-Evaluation 03/03/15   Authorization Type 8   Authorization Time Period 10   PT Start Time 0801   PT Stop Time 0845   PT Time Calculation (min) 44 min   Activity Tolerance Patient tolerated treatment well;Patient limited by pain   Behavior During Therapy [facility] for tasks assessed/performed      History reviewed. No pertinent past medical history.  History reviewed. No pertinent past surgical history.  There were no vitals filed for this visit.  Visit Diagnosis:  Right shoulder pain  Muscle weakness of right upper extremity      Subjective Assessment - 01/29/15 0842    Subjective Patient reports she is still having significant pain level in right shoulder and is not sleeping well. She is now sleeping in bed intermittently.    Patient Stated Goals to return to prior level of function for using right arm   Currently in Pain? Yes   Pain Score 8    Pain Location Shoulder   Pain Orientation Right   Pain Descriptors / Indicators Burning;Aching;Throbbing;Pins and needles   Pain Type Acute pain;Surgical pain   Pain Onset More than a month ago   Pain Frequency Intermittent   Aggravating Factors  driving, rotating right arm   Pain Relieving Factors Ice, rest, therapy   Effect of Pain on Daily Activities unable to use right UE for most tasks due to incresaed pain in right shoulder/UE   Multiple Pain Sites No                         [facility] Adult PT Treatment/Exercise - 01/29/15 0849    Exercises   Exercises Other Exercises   Other  Exercises  standing exercises with tactile and verbal cuing: UE ranger for forward elevation and ER/IR with platform on floor x 10 reps each, scapular adduction at [facility] 5# through partial ROM and eccentric control with verbal and tactile cues with left UE assist, reverse chin up/lat pull downwith 10# at cable   Modalities   Modalities Electrical Stimulation;Cryotherapy;Ultrasound   Cryotherapy   Number Minutes Cryotherapy 20 Minutes   Cryotherapy Location Shoulder   Type of Cryotherapy Ice pack   Electrical Stimulation   Electrical Stimulation Location right shoulder   Electrical Stimulation Parameters high volt (2) electrodes appllied to anterior andposterior aspect of right shoulder, Russian stim. 10/10 cycle applied along medial border of scapula, with patient in seated position with right UE supported   Electrical Stimulation Goals Neuromuscular facilitation;Pain   Ultrasound   Ultrasound Location right shoulder   Ultrasound Parameters 1MHz 50% pulsed @ 1.4 w/cm2 upper trapezius, right shoulder   Ultrasound Goals Pain   Manual Therapy   Manual Therapy Soft tissue mobilization   Manual therapy comments superficial techniques to right shoulder to improve elasticity, decrease spasms and control pain   Soft tissue mobilization right shoulder UT, spasms with patient seated     Patient response to treatment: decreased pain reported in right shoulder following estim/ice and [place] from 8/10 to  5/10, required guidance to perform exercises with appropriate technique and with good posture, limited by pain, pain monitored throughout session           PT Education - 01/29/15 0853    Education provided Yes   Education Details re assessed home program for pain control/exercises with concentration on scapular control: adduction and lower trapezius recruitment   Person(s) Educated Patient   Methods Explanation;Demonstration;Verbal cues   Comprehension Verbalized understanding;Returned  demonstration;Verbal cues required             PT Long Term Goals - 01/29/15 1440    PT LONG TERM GOAL #1   Title Patient will improve quicDASH to 50% or better demonstraing improvement with self perceived disability with ADL's by 01/29/2015   Baseline quickDash 62%   Status Not Met   PT LONG TERM GOAL #2   Title Patient will report decreased pain level to 3/10 max in right shoulder with reaching activities to allow her to perform hair care and reach into cabinets by 01/29/2015    Baseline Pain level 6/10   Status Not Met   PT LONG TERM GOAL #3   Title Patient will improve quickDASH to 30% or better indicating improved self perceived disabilty with using right UE by 03/03/2015   Baseline 62%    Status On-going   PT LONG TERM GOAL #4   Title Patient will be independent with home program for pain control and self management of symptoms for right UE without cuing by 03/03/2015   Baseline limited knowledge of appropriate pain control strategies and exercise progression   Status On-going               Plan - 01/29/15 0855    Clinical Impression Statement Pain continues to be primary limiting factor for progression. She is able to perform more exercises than previously however pain limits progression.    Pt will benefit from skilled therapeutic intervention in order to improve on the following deficits Decreased strength;Increased muscle spasms;Impaired flexibility;Impaired UE functional use;Pain;Decreased range of motion   Rehab Potential Good   Clinical Impairments Affecting Rehab Potential recurrent rotator cuff tear, previous surgery within the past 6 months   PT Frequency 2x / week   PT Duration 8 weeks   PT Treatment/Interventions Manual techniques;Cryotherapy;Therapeutic exercise;Electrical Stimulation;Scar mobilization;Passive range of motion;Moist Heat;Ultrasound   PT Next Visit Plan pain control, manual therapy techniques, ther. ex.        Problem List Patient  Active Problem List   Diagnosis Date Noted  . HYPERLIPIDEMIA 06/21/2009  . HYPERTENSION 06/21/2009  . 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 FEVER UNSPECIFIED 06/21/2009  . NAUSEA 06/21/2009  . DYSPHAGIA 06/21/2009  . RUQ PAIN 06/21/2009  . HIATAL HERNIA 01/07/2008  . HYPOTHYROIDISM 11/22/2007  . ANXIETY 11/22/2007  . DEPRESSION 11/22/2007  . HYPERTENSIVE CARDIOVASCULAR DISEASE 11/22/2007  . 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 RENAL CALCULUS 11/22/2007  . DEGENERATIVE JOINT DISEASE 11/22/2007  . SLEEP APNEA 11/22/2007  . HEADACHE, CHRONIC 11/22/2007  . GASTRITIS 10/23/2007  . DIVERTICULOSIS, COLON 10/23/2007  . DIABETES MELLITUS 07/16/2007  . HYPERCHOLESTEROLEMIA 07/16/2007  . OBESITY, MODERATE 07/16/2007  . ISCHEMIC HEART DISEASE 07/16/2007  . ESOPHAGEAL REFLUX 07/16/2007  . SCLERODERMA 07/16/2007  . DYSPNEA 07/16/2007  . COUGH, CHRONIC 07/16/2007    Jomarie Longs PT 01/29/2015, 2:42 PM  [facility] [address], [place], [address] Phone: [phone]   Fax:  [phone]

## 2015-02-02 ENCOUNTER — Ambulatory Visit: Payer: PPO | Admitting: Physical Therapy

## 2015-02-02 ENCOUNTER — Encounter: Payer: Self-pay | Admitting: Physical Therapy

## 2015-02-02 DIAGNOSIS — M25511 Pain in right shoulder: Secondary | ICD-10-CM

## 2015-02-02 DIAGNOSIS — M6281 Muscle weakness (generalized): Secondary | ICD-10-CM

## 2015-02-02 NOTE — Therapy (Signed)
[facility] [address] [place], [address] Phone: [phone]   Fax:  [phone]  Physical Therapy Treatment  Patient Details  Name: Teresa [name] MRN: [id number] Date of Birth: [birth year] Referring Provider:  Melrose Nakayama, MD  Encounter Date: 02/02/2015      PT End of Session - 02/02/15 0848    Visit Number 9   Number of Visits 16   Date for PT Re-Evaluation 03/03/15   Authorization Type 9   Authorization Time Period 10   PT Start Time 0755   PT Stop Time 0855   PT Time Calculation (min) 60 min   Activity Tolerance Patient limited by pain   Behavior During Therapy [facility] for tasks assessed/performed      History reviewed. No pertinent past medical history.  History reviewed. No pertinent past surgical history.  There were no vitals filed for this visit.  Visit Diagnosis:  Right shoulder pain  Muscle weakness of right upper extremity      Subjective Assessment - 02/02/15 0758    Subjective Patient reports she is still having significant pain in right shoulder and is not sleeping well. She is now sleeping in bed intermittently. She did not sleep well at all last night.   Patient Stated Goals to return to prior level of function for using right arm   Currently in Pain? Yes   Pain Score 8    Pain Location Shoulder   Pain Orientation Right   Pain Descriptors / Indicators Aching;Burning;Throbbing;Pins and needles;Other (Comment)  pins and needles right hand intermittently   Pain Onset More than a month ago   Pain Frequency Constant   Aggravating Factors  driving , rotating right arm   Pain Relieving Factors ice, rest, therapy   Effect of Pain on Daily Activities all activity increases pain and she cannot sleep well due to pain   Multiple Pain Sites No      Strength: right shoulder forward elevation, ER, IR and extension grossly at least 3+/5 with increased pain reported with even mild  resistance given      [facility] Adult PT Treatment/Exercise - 02/02/15 0804    Exercises   Exercises Other Exercises   Other Exercises  sitting exercises: isometric right shoulder ER/IR and Flexion/extension 3 sets 3 reps, seated scapular rows with 10# double arm, standing straight arm pull downs x 10, chest press with assistance and 5#, standing rows with 5# x 10 and re educated in proper positioning of right shoulder back and down for all exercises and red resistive band at door for shoulder extension through short arc with verbal cues   Modalities   Modalities Electrical Stimulation;Cryotherapy;Ultrasound   Cryotherapy   Number Minutes Cryotherapy 20 Minutes   Cryotherapy Location Shoulder   Type of Cryotherapy Ice pack   Electrical Stimulation   Electrical Stimulation Location right shoulder   Electrical Stimulation Parameters high volt (2) electrodes applied to anterior and posterior aspect of shoulder, Russian stim. 10/10 cycle applied along medial border of scapula, with patient in seated position with right UE supported   Electrical Stimulation Goals Neuromuscular facilitation;Pain   Ultrasound   Ultrasound Location right shoulder   Ultrasound Parameters 1MHz 50% pulsed @ 1.4 w/cm2 x 10 min.   Ultrasound Goals Pain    Patient response to treatment: required guidance and verbal cuing to perform exercises. Continued with pain throughout treatment, some decreased pain with [place] prior to exercise. Reported decreased pain following ice and  estim.            PT Education - 02/02/15 0845    Education provided Yes   Education Details re educated in proper positioning of right shoulder during exercises to decrease pain   Person(s) Educated Patient   Methods Explanation;Demonstration;Verbal cues   Comprehension Verbalized understanding;Verbal cues required;Returned demonstration             PT Long Term Goals - 01/29/15 1440    PT LONG TERM GOAL #1   Title Patient will improve  quicDASH to 50% or better demonstraing improvement with self perceived disability with ADL's by 01/29/2015   Baseline quickDash 62%   Status Not Met   PT LONG TERM GOAL #2   Title Patient will report decreased pain level to 3/10 max in right shoulder with reaching activities to allow her to perform hair care and reach into cabinets by 01/29/2015    Baseline Pain level 6/10   Status Not Met   PT LONG TERM GOAL #3   Title Patient will improve quickDASH to 30% or better indicating improved self perceived disabilty with using right UE by 03/03/2015   Baseline 62%    Status On-going   PT LONG TERM GOAL #4   Title Patient will be independent with home program for pain control and self management of symptoms for right UE without cuing by 03/03/2015   Baseline limited knowledge of appropriate pain control strategies and exercise progression   Status On-going               Plan - 02/02/15 0849    Clinical Impression Statement Pain continues to be primary limiting factor for progression. She is able to perform all exercises during treatment session with increased pain. Modified exercises to help with pain.    Pt will benefit from skilled therapeutic intervention in order to improve on the following deficits Decreased strength;Increased muscle spasms;Impaired flexibility;Impaired UE functional use;Pain;Decreased range of motion   Rehab Potential Good   Clinical Impairments Affecting Rehab Potential recurrent rotator cuff tear, previous surgery within the past 6 months   PT Frequency 2x / week   PT Duration 8 weeks   PT Treatment/Interventions Manual techniques;Cryotherapy;Therapeutic exercise;Electrical Stimulation;Scar mobilization;Passive range of motion;Moist Heat;Ultrasound   PT Next Visit Plan pain control, manual therapy techniques, ther. ex.        Problem List Patient Active Problem List   Diagnosis Date Noted  . HYPERLIPIDEMIA 06/21/2009  . HYPERTENSION 06/21/2009  . 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 FEVER UNSPECIFIED 06/21/2009  . NAUSEA 06/21/2009  . DYSPHAGIA 06/21/2009  . RUQ PAIN 06/21/2009  . HIATAL HERNIA 01/07/2008  . HYPOTHYROIDISM 11/22/2007  . ANXIETY 11/22/2007  . DEPRESSION 11/22/2007  . HYPERTENSIVE CARDIOVASCULAR DISEASE 11/22/2007  . 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 RENAL CALCULUS 11/22/2007  . DEGENERATIVE JOINT DISEASE 11/22/2007  . SLEEP APNEA 11/22/2007  . HEADACHE, CHRONIC 11/22/2007  . GASTRITIS 10/23/2007  . DIVERTICULOSIS, COLON 10/23/2007  . DIABETES MELLITUS 07/16/2007  . HYPERCHOLESTEROLEMIA 07/16/2007  . OBESITY, MODERATE 07/16/2007  . ISCHEMIC HEART DISEASE 07/16/2007  . ESOPHAGEAL REFLUX 07/16/2007  . SCLERODERMA 07/16/2007  . DYSPNEA 07/16/2007  . COUGH, CHRONIC 07/16/2007    Jomarie Longs PT 02/02/2015, 8:56 AM  [place] [facility] [address] [place], [address] Phone: [phone]   Fax:  [phone]

## 2015-02-04 ENCOUNTER — Encounter: Payer: Self-pay | Admitting: Physical Therapy

## 2015-02-04 ENCOUNTER — Ambulatory Visit: Payer: PPO | Admitting: Physical Therapy

## 2015-02-04 DIAGNOSIS — M25511 Pain in right shoulder: Secondary | ICD-10-CM

## 2015-02-04 DIAGNOSIS — M6281 Muscle weakness (generalized): Secondary | ICD-10-CM

## 2015-02-04 NOTE — Therapy (Signed)
[place] [facility] [address] [place], [address] Phone: [phone]   Fax:  [phone]  Physical Therapy Treatment  Patient Details  Name: Teresa [name] MRN: [id number] Date of Birth: [birth year] Referring Provider:  Melrose Nakayama, MD  Encounter Date: 02/04/2015      PT End of Session - 02/04/15 1116    Visit Number 10   Number of Visits 16   Date for PT Re-Evaluation 03/03/15   Authorization Type 10   Authorization Time Period 10   PT Start Time 1030   PT Stop Time 1115   PT Time Calculation (min) 45 min   Activity Tolerance Patient tolerated treatment well   Behavior During Therapy [facility] for tasks assessed/performed      History reviewed. No pertinent past medical history.  History reviewed. No pertinent past surgical history.  There were no vitals filed for this visit.  Visit Diagnosis:  Right shoulder pain  Muscle weakness of right upper extremity      Subjective Assessment - 02/04/15 1105    Subjective Patient reports she is still having significant pain level in right shoulder and is not sleeping well. She is now sleeping in bed intermittently. She did not sleep well at all last night. lots of weakness, could not even lift a half gallon of milk wihtout difficulty.   Limitations Lifting;House hold activities   Patient Stated Goals to return to prior level of function for using right arm   Currently in Pain? Yes   Pain Score 8    Pain Location Shoulder   Pain Orientation Right   Pain Descriptors / Indicators Aching;Burning;Throbbing   Pain Type Acute pain;Surgical pain   Pain Frequency Constant   Multiple Pain Sites No      Objective: grip strength right 10#, left 50#, 3 point pinch: 6# right and left, lateral pinch left 11#, right 7#, 9# 10# Biceps strength difficult to assess due to pain reproduced with any right UE movement          [facility] Adult PT Treatment/Exercise - 02/04/15  1107    Exercises   Exercises Other Exercises   Other Exercises (hold exercises today due to intensity of pain) sitting exercises: isometric right shoulder ER/IR and Flexion/extension 3 sets 3 reps, seated scapular rows with 10# double arm, standing straight arm pull downs x 10, chest press with assistance and 5#, standing rows with 5# x 10 and re educated in proper positioning of right shoulder back and down for all exercises and red resistive band at door for shoulder extension through short arc with verbal cues   Modalities   Modalities Electrical Stimulation;Cryotherapy;Ultrasound   Cryotherapy   Number Minutes Cryotherapy 20 Minutes   Cryotherapy Location Shoulder   Type of Cryotherapy Ice pack   Electrical Stimulation   Electrical Stimulation Location right shoulder   Electrical Stimulation Parameters high volt (2) electrodes applied to anterior and posterior asepct of shoulder, Russian stim. 10/10 cycle appled along medial border of scapula, with patient in seated position with right UE supported   Electrical Stimulation Goals Neuromuscular facilitation;Pain   Ultrasound   Ultrasound Location right shoulder   Ultrasound Parameters 1 MHz 50% pulsed @ 1.4 w/cm2 x 10 min.    Ultrasound Goals Pain   Manual Therapy (hold manual therapy today)   Manual Therapy Soft tissue mobilization   Manual therapy comments superficial techniques to right shoulder to improve elasticity, decrease spasms and control pain   Soft  tissue mobilization right shoulder UT, spasms with patient seated      Patient response to treatment: mild decreased pain in right shoulder, still having significant pain with most movement            PT Education - 02/04/15 1109    Education provided Yes   Education Details instructed in use of thera putty to improve grip strength   Person(s) Educated Patient   Methods Explanation;Demonstration;Verbal cues   Comprehension Verbalized understanding;Returned  demonstration;Verbal cues required             PT Long Term Goals - 01/29/15 1440    PT LONG TERM GOAL #1   Title Patient will improve quicDASH to 50% or better demonstraing improvement with self perceived disability with ADL's by 01/29/2015   Baseline quickDash 62%   Status Not Met   PT LONG TERM GOAL #2   Title Patient will report decreased pain level to 3/10 max in right shoulder with reaching activities to allow her to perform hair care and reach into cabinets by 01/29/2015    Baseline Pain level 6/10   Status Not Met   PT LONG TERM GOAL #3   Title Patient will improve quickDASH to 30% or better indicating improved self perceived disabilty with using right UE by 03/03/2015   Baseline 62%    Status On-going   PT LONG TERM GOAL #4   Title Patient will be independent with home program for pain control and self management of symptoms for right UE without cuing by 03/03/2015   Baseline limited knowledge of appropriate pain control strategies and exercise progression   Status On-going               Plan - 02/04/15 1112    Clinical Impression Statement Patient has primary limitation of pain that prevents her from progressing with exerciss and limits her functional use of right UE for daily household chores and personal care. She will benefit from continued physical therapy intervention for pain control and progressive exercises as tolerated.    Pt will benefit from skilled therapeutic intervention in order to improve on the following deficits Decreased strength;Increased muscle spasms;Impaired flexibility;Impaired UE functional use;Pain;Decreased range of motion   Rehab Potential Good   Clinical Impairments Affecting Rehab Potential recurrent rotator cuff tear, previous surgery within the past 6 months   PT Frequency 2x / week   PT Duration 8 weeks   PT Treatment/Interventions Manual techniques;Cryotherapy;Therapeutic exercise;Electrical Stimulation;Scar mobilization;Passive range  of motion;Moist Heat;Ultrasound   PT Next Visit Plan pain control, manual therapy techniques, ther. ex.        Problem List Patient Active Problem List   Diagnosis Date Noted  . HYPERLIPIDEMIA 06/21/2009  . HYPERTENSION 06/21/2009  . 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 FEVER UNSPECIFIED 06/21/2009  . NAUSEA 06/21/2009  . DYSPHAGIA 06/21/2009  . RUQ PAIN 06/21/2009  . HIATAL HERNIA 01/07/2008  . HYPOTHYROIDISM 11/22/2007  . ANXIETY 11/22/2007  . DEPRESSION 11/22/2007  . HYPERTENSIVE CARDIOVASCULAR DISEASE 11/22/2007  . 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 RENAL CALCULUS 11/22/2007  . DEGENERATIVE JOINT DISEASE 11/22/2007  . SLEEP APNEA 11/22/2007  . HEADACHE, CHRONIC 11/22/2007  . GASTRITIS 10/23/2007  . DIVERTICULOSIS, COLON 10/23/2007  . DIABETES MELLITUS 07/16/2007  . HYPERCHOLESTEROLEMIA 07/16/2007  . OBESITY, MODERATE 07/16/2007  . ISCHEMIC HEART DISEASE 07/16/2007  . ESOPHAGEAL REFLUX 07/16/2007  . SCLERODERMA 07/16/2007  . DYSPNEA 07/16/2007  . COUGH, CHRONIC 07/16/2007    Jomarie Longs PT 02/04/2015, 11:17 PM  [place]  [place] [facility] [address], [place], [address] Phone: [phone]   Fax:  [phone]

## 2015-02-05 ENCOUNTER — Ambulatory Visit: Payer: PPO | Admitting: Physical Therapy

## 2015-02-06 ENCOUNTER — Other Ambulatory Visit: Payer: Self-pay | Admitting: Family Medicine

## 2015-02-09 ENCOUNTER — Ambulatory Visit: Payer: PPO | Attending: Orthopaedic Surgery | Admitting: Physical Therapy

## 2015-02-09 ENCOUNTER — Encounter: Payer: Self-pay | Admitting: Physical Therapy

## 2015-02-09 DIAGNOSIS — M25511 Pain in right shoulder: Secondary | ICD-10-CM | POA: Diagnosis present

## 2015-02-09 DIAGNOSIS — M6281 Muscle weakness (generalized): Secondary | ICD-10-CM | POA: Insufficient documentation

## 2015-02-09 NOTE — Therapy (Signed)
[place] [facility] [address] [place], [address] Phone: [phone]   Fax:  [phone]  Physical Therapy Treatment  Patient Details  Name: Teresa [name] MRN: [id number] Date of Birth: 20-Nov-1945 Referring Provider:  Melrose Nakayama, MD  Encounter Date: 02/09/2015      PT End of Session - 02/09/15 0831    Visit Number 11   Number of Visits 16   Date for PT Re-Evaluation 03/03/15   Authorization Type 11   Authorization Time Period 20   PT Start Time 0755   PT Stop Time 0845   PT Time Calculation (min) 50 min   Activity Tolerance Patient limited by pain;Patient tolerated treatment well   Behavior During Therapy [facility] for tasks assessed/performed      History reviewed. No pertinent past medical history.  History reviewed. No pertinent past surgical history.  There were no vitals filed for this visit.  Visit Diagnosis:  Right shoulder pain  Muscle weakness of right upper extremity      Subjective Assessment - 02/09/15 0815    Subjective Patient reports she is still having significant pain level in right shoulder and is not sleeping well. She is now sleeping in bed intermittently. She is in a lot of pain and the pain is constant and even at rest now. Nothing gives lasting relief and she feels therapy does still offer her relief for that day.    Limitations Lifting;House hold activities   Patient Stated Goals to return to prior level of function for using right arm   Currently in Pain? Yes   Pain Score 8    Pain Location Shoulder   Pain Orientation Right   Pain Type Acute pain;Surgical pain   Pain Onset More than a month ago   Pain Frequency Constant   Effect of Pain on Daily Activities Having pain at rest and with movement and having numbness and tingling in right hand, grip strength today <10# due to pain   Multiple Pain Sites No         [facility] Adult PT Treatment/Exercise - 02/09/15 0825    Exercises   Exercises Other Exercises   Other Exercises  sitting exercises: isometric right shoulder ER/IR and Flexion/extension 3 sets 3 reps, with only mild resistance given, AAROM right shoulder elevation x 5 reps with pain throughout ROM, no worse   Modalities   Modalities Electrical Stimulation;Cryotherapy;Ultrasound   Cryotherapy   Number Minutes Cryotherapy 20 Minutes   Cryotherapy Location Shoulder   Type of Cryotherapy Ice pack   Electrical Stimulation   Electrical Stimulation Location right shoulder   Electrical Stimulation Parameters high volt (2) electrodes applied to anterior and posterior aspect of shoulde, Russian stim., 10/10 cycle applied along medial boader of scapula, with patient in seated position with right UE supported   Electrical Stimulation Goals Neuromuscular facilitation;Pain   Ultrasound   Ultrasound Location right shoulder   Ultrasound Parameters 1MHz 50% pulsed @ 1.4 w/cm2 x 10 min.   Ultrasound Goals Pain   Manual Therapy   Manual Therapy --   Manual therapy comments --   Soft tissue mobilization --       Patient response to treatment: improved grip strength from <10# to 15# following pain relieving modalities, She did not tolerate exercises well due to increased pain level today on arrival 8+/10, decreased pain level to 6/10 following treatment          PT Education - 02/09/15 0830  Education provided Yes   Education Details Continue with ice and medication as prescribed by MD to control pain, continue with scapular adduction and ROM exercises for right shoulder and putty for hand grip exercises   Person(s) Educated Patient   Methods Explanation   Comprehension Verbalized understanding             PT Long Term Goals - 01/29/15 1440    PT LONG TERM GOAL #1   Title Patient will improve quicDASH to 50% or better demonstraing improvement with self perceived disability with ADL's by 01/29/2015   Baseline quickDash 62%   Status Not Met    PT LONG TERM GOAL #2   Title Patient will report decreased pain level to 3/10 max in right shoulder with reaching activities to allow her to perform hair care and reach into cabinets by 01/29/2015    Baseline Pain level 6/10   Status Not Met   PT LONG TERM GOAL #3   Title Patient will improve quickDASH to 30% or better indicating improved self perceived disabilty with using right UE by 03/03/2015   Baseline 62%    Status On-going   PT LONG TERM GOAL #4   Title Patient will be independent with home program for pain control and self management of symptoms for right UE without cuing by 03/03/2015   Baseline limited knowledge of appropriate pain control strategies and exercise progression   Status On-going               Plan - 02/09/15 0833    Clinical Impression Statement Patient continues with primary limiting factor of pain in right shoulder that prevents her from progressing with exercises and limits functional use of right UE for daily household chores and personal care. She is aware of appropriate pain control strategies including ice and medication and does get some relief with [place] and estim. during therapy sessions.    Pt will benefit from skilled therapeutic intervention in order to improve on the following deficits Decreased strength;Increased muscle spasms;Impaired flexibility;Impaired UE functional use;Pain;Decreased range of motion   Rehab Potential Good   Clinical Impairments Affecting Rehab Potential recurrent rotator cuff tear, previous surgery within the past 6 months   PT Frequency 2x / week   PT Duration 8 weeks   PT Treatment/Interventions Manual techniques;Cryotherapy;Therapeutic exercise;Electrical Stimulation;Scar mobilization;Passive range of motion;Moist Heat;Ultrasound   PT Next Visit Plan pain control, manual therapy techniques, ther. ex.        Problem List Patient Active Problem List   Diagnosis Date Noted  . HYPERLIPIDEMIA 06/21/2009  . HYPERTENSION  06/21/2009  . 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 FEVER UNSPECIFIED 06/21/2009  . NAUSEA 06/21/2009  . DYSPHAGIA 06/21/2009  . RUQ PAIN 06/21/2009  . HIATAL HERNIA 01/07/2008  . HYPOTHYROIDISM 11/22/2007  . ANXIETY 11/22/2007  . DEPRESSION 11/22/2007  . HYPERTENSIVE CARDIOVASCULAR DISEASE 11/22/2007  . 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 RENAL CALCULUS 11/22/2007  . DEGENERATIVE JOINT DISEASE 11/22/2007  . SLEEP APNEA 11/22/2007  . HEADACHE, CHRONIC 11/22/2007  . GASTRITIS 10/23/2007  . DIVERTICULOSIS, COLON 10/23/2007  . DIABETES MELLITUS 07/16/2007  . HYPERCHOLESTEROLEMIA 07/16/2007  . OBESITY, MODERATE 07/16/2007  . ISCHEMIC HEART DISEASE 07/16/2007  . ESOPHAGEAL REFLUX 07/16/2007  . SCLERODERMA 07/16/2007  . DYSPNEA 07/16/2007  . COUGH, CHRONIC 07/16/2007    Jomarie Longs PT 02/09/2015, 8:47 AM  [place] [address], [place], [address] Phone: [phone]   Fax:  [phone]

## 2015-02-12 ENCOUNTER — Ambulatory Visit: Payer: PPO | Admitting: Physical Therapy

## 2015-02-12 DIAGNOSIS — M25511 Pain in right shoulder: Secondary | ICD-10-CM | POA: Diagnosis not present

## 2015-02-12 DIAGNOSIS — M6281 Muscle weakness (generalized): Secondary | ICD-10-CM

## 2015-02-12 NOTE — Therapy (Signed)
[facility] [address] [place], [address] Phone: [phone]   Fax:  [phone]  Physical Therapy Treatment  Patient Details  Name: Teresa [name] MRN: [id number] Date of Birth: 07-04-1946 Referring Provider:  Melrose Nakayama, MD  Encounter Date: 02/12/2015      PT End of Session - 02/12/15 0841    Visit Number 12   Number of Visits 16   Authorization Type 12   Authorization Time Period 20   PT Start Time 0800   PT Stop Time 0845   PT Time Calculation (min) 45 min   Activity Tolerance Patient limited by pain   Behavior During Therapy [facility] for tasks assessed/performed      No past medical history on file.  No past surgical history on file.  There were no vitals filed for this visit.  Visit Diagnosis:  Right shoulder pain  Muscle weakness of right upper extremity      Subjective Assessment - 02/12/15 0800    Subjective Still having pain at rest and with all motion of right UE. She also reports she is still having numbness in her right hand intermittently throughout the day and at night. She is still feeling weak and cannot lift anything.    Limitations Lifting;House hold activities   Patient Stated Goals to return to prior level of function for using right arm   Currently in Pain? Yes   Pain Score 8    Pain Location Shoulder   Pain Orientation Right   Pain Descriptors / Indicators Aching   Pain Type Acute pain;Surgical pain   Pain Onset More than a month ago   Pain Frequency Constant  pain is constant, numbness in hand is intermittent   Aggravating Factors  driving, rotating right arm to turn   Pain Relieving Factors ice, rest, therapy    Effect of Pain on Daily Activities all activity is affected   Multiple Pain Sites No      Objective: AROM: right shoulder (sitting) forward elevation 0-150 with pain throughout ROM, AAROM rotations WFL's with pain reported Strength: grip left 40#,  right 0#, 25# with lots of effort and pain reported by patient. Lateral pinch right 12# (improved for 7-10# previously tested 02/04/2015)        [facility] Adult PT Treatment/Exercise - 02/12/15 0804    Exercises   Exercises Other Exercises   Other Exercises  sitting exercises: Re assessed home program to continue pulleys, isometric exercises for shoulder/UE and putty exercises for right hand strengthening   Modalities   Modalities Electrical Stimulation;Cryotherapy;Ultrasound   Cryotherapy   Number Minutes Cryotherapy 20 Minutes   Cryotherapy Location Shoulder   Type of Cryotherapy Ice pack   Electrical Stimulation   Electrical Stimulation Location right shoulder   Electrical Stimulation Parameters high volt (2) electrodes applied to anterior and posterior  aspect of shoulder, Russian stim. 10/10 cycle appleid along medial border of scapula, with patient in seated position with right UE supported   Electrical Stimulation Goals Neuromuscular facilitation;Pain   Ultrasound   Ultrasound Location right shoulder    Ultrasound Parameters 1MHz 50% pulsed @ 1.4 w/cm2   Ultrasound Goals Pain      Patient response to treatment: pain control and some relief of pain (6/10)  in right shoulder and decreased spasms in right upper trapezius, improved periscapular contraction today as compared to previous session             PT Education - 02/12/15  0838    Education provided Yes   Education Details continue with putty for right hand and pulley for ROM    Person(s) Educated Patient   Methods Explanation   Comprehension Verbalized understanding             PT Long Term Goals - 01/29/15 1440    PT LONG TERM GOAL #1   Title Patient will improve quicDASH to 50% or better demonstraing improvement with self perceived disability with ADL's by 01/29/2015   Baseline quickDash 62%   Status Not Met   PT LONG TERM GOAL #2   Title Patient will report decreased pain level to 3/10 max in right  shoulder with reaching activities to allow her to perform hair care and reach into cabinets by 01/29/2015    Baseline Pain level 6/10   Status Not Met   PT LONG TERM GOAL #3   Title Patient will improve quickDASH to 30% or better indicating improved self perceived disabilty with using right UE by 03/03/2015   Baseline 62%    Status On-going   PT LONG TERM GOAL #4   Title Patient will be independent with home program for pain control and self management of symptoms for right UE without cuing by 03/03/2015   Baseline limited knowledge of appropriate pain control strategies and exercise progression   Status On-going               Plan - 02/12/15 5885    Clinical Impression Statement Patient with primary limiting factor of pain. Improved grip strength to 25# from <10# previous session and lateral pinch improved to 12# from 7# -10#  prevous session. She is exercising at home to improve ROM and strength as tolerated and will continue to require physical therapy intervention to guide exercise as pain allows in order to progress to return to prior level of function.    Rehab Potential Good   PT Frequency 2x / week   PT Duration 8 weeks        Problem List Patient Active Problem List   Diagnosis Date Noted  . HYPERLIPIDEMIA 06/21/2009  . HYPERTENSION 06/21/2009  . 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 FEVER UNSPECIFIED 06/21/2009  . NAUSEA 06/21/2009  . DYSPHAGIA 06/21/2009  . RUQ PAIN 06/21/2009  . HIATAL HERNIA 01/07/2008  . HYPOTHYROIDISM 11/22/2007  . ANXIETY 11/22/2007  . DEPRESSION 11/22/2007  . HYPERTENSIVE CARDIOVASCULAR DISEASE 11/22/2007  . 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 RENAL CALCULUS 11/22/2007  . DEGENERATIVE JOINT DISEASE 11/22/2007  . SLEEP APNEA 11/22/2007  . HEADACHE, CHRONIC 11/22/2007  . GASTRITIS 10/23/2007  . DIVERTICULOSIS, COLON 10/23/2007  . DIABETES MELLITUS 07/16/2007  . HYPERCHOLESTEROLEMIA 07/16/2007  . OBESITY, MODERATE 07/16/2007  . ISCHEMIC  HEART DISEASE 07/16/2007  . ESOPHAGEAL REFLUX 07/16/2007  . SCLERODERMA 07/16/2007  . DYSPNEA 07/16/2007  . COUGH, CHRONIC 07/16/2007    Jomarie Longs PT 02/12/2015, 8:47 AM  [facility] [address] [place], [address] Phone: [phone]   Fax:  [phone]

## 2015-02-16 ENCOUNTER — Ambulatory Visit: Payer: PPO | Admitting: Physical Therapy

## 2015-02-16 ENCOUNTER — Encounter: Payer: Self-pay | Admitting: Physical Therapy

## 2015-02-16 DIAGNOSIS — M6281 Muscle weakness (generalized): Secondary | ICD-10-CM

## 2015-02-16 DIAGNOSIS — M25511 Pain in right shoulder: Secondary | ICD-10-CM | POA: Diagnosis not present

## 2015-02-16 NOTE — Therapy (Signed)
[facility] [address], [place], [address] Phone: [phone]   Fax:  [phone]  Physical Therapy Treatment  Patient Details  Name: Teresa [name] MRN: [id number] Date of Birth: [birth year] Referring Provider:  Melrose Nakayama, MD  Encounter Date: 02/16/2015      PT End of Session - 02/16/15 0848    Visit Number 13   Number of Visits 16   Date for PT Re-Evaluation 03/03/15   Authorization Type 13   Authorization Time Period 20   PT Start Time 0802   PT Stop Time 0855   PT Time Calculation (min) 53 min   Activity Tolerance Patient limited by pain   Behavior During Therapy [facility] for tasks assessed/performed      History reviewed. No pertinent past medical history.  History reviewed. No pertinent past surgical history.  There were no vitals filed for this visit.  Visit Diagnosis:  Right shoulder pain  Muscle weakness of right upper extremity      Subjective Assessment - 02/16/15 0803    Subjective Still having pain at rest and with all motion of right UE. She also reports she is still having numbness in her right hand intermittently throughout the day and at night. She is still feeling weak and cannot lift anything.    Limitations Lifting;House hold activities   Patient Stated Goals to return to prior level of function for using right arm   Currently in Pain? Yes   Pain Score 7   Pain level remains 7/10 consistently at the moment   Pain Location Shoulder   Pain Descriptors / Indicators Aching;Other (Comment)  has popping and cracking noises in shoulder as well and in scapular region   Pain Type Surgical pain;Acute pain   Pain Onset More than a month ago   Pain Frequency --  pain is constant and has intermittent numbness and tingling into right hand   Aggravating Factors  driving, rotating right arm to turn   Pain Relieving Factors ice, rest                         [facility]  Adult PT Treatment/Exercise - 02/16/15 0807    Exercises   Exercises Other Exercises   Other Exercises  Sitting: isometric ER/IR    Modalities   Modalities Electrical Stimulation;Cryotherapy;Ultrasound   Cryotherapy   Cryotherapy Location Shoulder   Electrical Stimulation   Electrical Stimulation Location right shoulder   Electrical Stimulation Goals Neuromuscular facilitation;Pain   Ultrasound   Ultrasound parameters and Goals Pain    Manual therapy techniques: joint mobilization thoracic spine with patient seated 3 sets x 3 reps each segment, instructed in  Self mobilization of thoracic spine.             PT Education - 02/16/15 0847    Education provided Yes   Education Details educated in isometric exercises using yellow resistive band   Person(s) Educated Patient   Methods Explanation;Demonstration;Verbal cues   Comprehension Verbalized understanding;Returned demonstration;Verbal cues required             PT Long Term Goals - 01/29/15 1440    PT LONG TERM GOAL #1   Title Patient will improve quicDASH to 50% or better demonstraing improvement with self perceived disability with ADL's by 01/29/2015   Baseline quickDash 62%   Status Not Met   PT LONG TERM GOAL #2   Title Patient will report decreased pain level  to 3/10 max in right shoulder with reaching activities to allow her to perform hair care and reach into cabinets by 01/29/2015    Baseline Pain level 6/10   Status Not Met   PT LONG TERM GOAL #3   Title Patient will improve quickDASH to 30% or better indicating improved self perceived disabilty with using right UE by 03/03/2015   Baseline 62%    Status On-going   PT LONG TERM GOAL #4   Title Patient will be independent with home program for pain control and self management of symptoms for right UE without cuing by 03/03/2015   Baseline limited knowledge of appropriate pain control strategies and exercise progression   Status On-going                Plan - 02/16/15 0848    Clinical Impression Statement Patient demonstrates improvement in strength and control right shoulder. She continues with primary limiting factor of pain. She is still reporting intermittent tingling and numbness into right hand at rest and with movement. She will benefit from continued physical therapy to further improve strength and control pain in order to return to as close to full functional use of right UE with minimal to no limitations.    Pt will benefit from skilled therapeutic intervention in order to improve on the following deficits Decreased strength;Increased muscle spasms;Impaired flexibility;Impaired UE functional use;Pain;Decreased range of motion   Rehab Potential Good   PT Frequency 2x / week   PT Duration 8 weeks   PT Treatment/Interventions Manual techniques;Cryotherapy;Therapeutic exercise;Electrical Stimulation;Scar mobilization;Passive range of motion;Moist Heat;Ultrasound   PT Next Visit Plan pain control, manual therapy techniques, ther. ex.        Problem List Patient Active Problem List   Diagnosis Date Noted  . HYPERLIPIDEMIA 06/21/2009  . HYPERTENSION 06/21/2009  . 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 FEVER UNSPECIFIED 06/21/2009  . NAUSEA 06/21/2009  . DYSPHAGIA 06/21/2009  . RUQ PAIN 06/21/2009  . HIATAL HERNIA 01/07/2008  . HYPOTHYROIDISM 11/22/2007  . ANXIETY 11/22/2007  . DEPRESSION 11/22/2007  . HYPERTENSIVE CARDIOVASCULAR DISEASE 11/22/2007  . 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 RENAL CALCULUS 11/22/2007  . DEGENERATIVE JOINT DISEASE 11/22/2007  . SLEEP APNEA 11/22/2007  . HEADACHE, CHRONIC 11/22/2007  . GASTRITIS 10/23/2007  . DIVERTICULOSIS, COLON 10/23/2007  . DIABETES MELLITUS 07/16/2007  . HYPERCHOLESTEROLEMIA 07/16/2007  . OBESITY, MODERATE 07/16/2007  . ISCHEMIC HEART DISEASE 07/16/2007  . ESOPHAGEAL REFLUX 07/16/2007  . SCLERODERMA 07/16/2007  . DYSPNEA 07/16/2007  . COUGH, CHRONIC 07/16/2007    Jomarie Longs PT 02/16/2015, 9:57 AM  [facility] [address] [place], [address] Phone: [phone]   Fax:  [phone]

## 2015-02-18 ENCOUNTER — Ambulatory Visit: Payer: PPO | Admitting: Physical Therapy

## 2015-02-18 ENCOUNTER — Encounter: Payer: Self-pay | Admitting: Physical Therapy

## 2015-02-18 DIAGNOSIS — M6281 Muscle weakness (generalized): Secondary | ICD-10-CM

## 2015-02-18 DIAGNOSIS — M25511 Pain in right shoulder: Secondary | ICD-10-CM | POA: Diagnosis not present

## 2015-02-18 NOTE — Therapy (Signed)
[facility] [address] [place], [address] Phone: [phone]   Fax:  [phone]  Physical Therapy Treatment  Patient Details  Name: Teresa [name] MRN: [id number] Date of Birth: [birth year] Referring Provider:  Melrose Nakayama, MD  Encounter Date: 02/18/2015      PT End of Session - 02/18/15 0900    Visit Number 14   Number of Visits 16   Date for PT Re-Evaluation 03/03/15   Authorization Type 14   Authorization Time Period 20   PT Start Time 0755   PT Stop Time 0850   PT Time Calculation (min) 55 min   Behavior During Therapy [facility] for tasks assessed/performed      History reviewed. No pertinent past medical history.  History reviewed. No pertinent past surgical history.  There were no vitals filed for this visit.  Visit Diagnosis:  Right shoulder pain  Muscle weakness of right upper extremity      Subjective Assessment - 02/18/15 0755    Subjective Still having pain at rest and with all motion of right UE. She also reports she is still having numbness in her right hand intermittently throughout the day and at night. She is still feeling weak and cannot lift anything.    Limitations Lifting;House hold activities   Patient Stated Goals to return to prior level of function for using right arm   Currently in Pain? Yes   Pain Score 9   worse today, no apparent reason   Pain Location Shoulder   Pain Orientation Right   Pain Descriptors / Indicators Aching;Other (Comment)  popping and cracking in shoulder with movement   Pain Type Surgical pain;Acute pain   Pain Onset More than a month ago   Pain Frequency --  constant pain in shoulder with intermittent numbness and tinging into right hand   Aggravating Factors  unable to drive, turn wheel because of pain   Pain Relieving Factors ice, rest   Effect of Pain on Daily Activities affects all use of right UE     Objective: Grip strength: right  15# (was 25# previous session and has improved from <10# 2 weeks ago), left grip 40#, lateral pinch right 6#, 3 point pinch 3.5# Strength right shoulder flexion, ER/IR extension grossly at least 4-/5 with pain all resistive motions, good periscapular strength, improving control AROM: right shoulder in sitting: forward elevation 140+ with pain increasing with elevation above shoulder level, AAROM: right shoulder WFL's all planes       [facility] Adult PT Treatment/Exercise - 02/18/15 0758    Exercises   Exercises Other Exercises   Other Exercises  seated isometric exercises only due to pain in right shoulder and all movement increases pain: performed with assistance of therapist: isometric ER, extension, shoulder depression and scapular adduction 3 sets of 5 reps each   Modalities   Modalities Electrical Stimulation;Cryotherapy;Ultrasound   Cryotherapy   Cryotherapy Location Shoulder: 20 min. With patient seated    Electrical Stimulation   Electrical Stimulation Location Parameters right shoulder High volt estim. Right shoulder with patient seated upper trapezius and AC joint, Russian stim. Medial aspect right scapula 10/10 cycle   Electrical Stimulation Goals Neuromuscular facilitation;Pain   Ultrasound    Ultrasound Parameters Ultrasound Goals 1MHz pulsed 50% @ 1.4w/cm2 x 10 min. Right shoulder with patient seated  Pain     Patient response to treatment: patient with decreased pain by~25% with treatment, She requires guidance for all exercises to  monitor pain.        PT Long Term Goals - 01/29/15 1440    PT LONG TERM GOAL #1   Title Patient will improve quicDASH to 50% or better demonstraing improvement with self perceived disability with ADL's by 01/29/2015   Baseline quickDash 62%   Status Not Met   PT LONG TERM GOAL #2   Title Patient will report decreased pain level to 3/10 max in right shoulder with reaching activities to allow her to perform hair care and reach into cabinets by  01/29/2015    Baseline Pain level 6/10   Status Not Met   PT LONG TERM GOAL #3   Title Patient will improve quickDASH to 30% or better indicating improved self perceived disabilty with using right UE by 03/03/2015   Baseline 62%    Status On-going   PT LONG TERM GOAL #4   Title Patient will be independent with home program for pain control and self management of symptoms for right UE without cuing by 03/03/2015   Baseline limited knowledge of appropriate pain control strategies and exercise progression   Status On-going               Plan - 02/18/15 2155    Clinical Impression Statement Patient continues with primary limiting factor of pain. she has close to full forward elevation with pain throughout entire ROM and improving strength in all major muscle groups. She is expected to continue with decreasing pain and improving strength with additional physical therapy intervention.     Pt will benefit from skilled therapeutic intervention in order to improve on the following deficits Decreased strength;Increased muscle spasms;Impaired flexibility;Impaired UE functional use;Pain;Decreased range of motion   Rehab Potential Good   Clinical Impairments Affecting Rehab Potential recurrent rotator cuff tear, previous surgery within the past 6 months   PT Frequency 2x / week   PT Duration 8 weeks   PT Treatment/Interventions Manual techniques;Cryotherapy;Therapeutic exercise;Electrical Stimulation;Scar mobilization;Passive range of motion;Moist Heat;Ultrasound   PT Next Visit Plan pain control, manual therapy techniques, ther. ex.        Problem List Patient Active Problem List   Diagnosis Date Noted  . HYPERLIPIDEMIA 06/21/2009  . HYPERTENSION 06/21/2009  . 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 FEVER UNSPECIFIED 06/21/2009  . NAUSEA 06/21/2009  . DYSPHAGIA 06/21/2009  . RUQ PAIN 06/21/2009  . HIATAL HERNIA 01/07/2008  . HYPOTHYROIDISM 11/22/2007  . ANXIETY 11/22/2007  . DEPRESSION  11/22/2007  . HYPERTENSIVE CARDIOVASCULAR DISEASE 11/22/2007  . 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 RENAL CALCULUS 11/22/2007  . DEGENERATIVE JOINT DISEASE 11/22/2007  . SLEEP APNEA 11/22/2007  . HEADACHE, CHRONIC 11/22/2007  . GASTRITIS 10/23/2007  . DIVERTICULOSIS, COLON 10/23/2007  . DIABETES MELLITUS 07/16/2007  . HYPERCHOLESTEROLEMIA 07/16/2007  . OBESITY, MODERATE 07/16/2007  . ISCHEMIC HEART DISEASE 07/16/2007  . ESOPHAGEAL REFLUX 07/16/2007  . SCLERODERMA 07/16/2007  . DYSPNEA 07/16/2007  . COUGH, CHRONIC 07/16/2007    Jomarie Longs PT 02/18/2015, 10:21 PM  [facility] [address] [place], [address] Phone: [phone]   Fax:  [phone]

## 2015-02-19 ENCOUNTER — Encounter: Payer: PPO | Admitting: Physical Therapy

## 2015-02-23 ENCOUNTER — Encounter: Payer: Self-pay | Admitting: Physical Therapy

## 2015-02-23 ENCOUNTER — Ambulatory Visit: Payer: PPO | Admitting: Physical Therapy

## 2015-02-23 DIAGNOSIS — M6281 Muscle weakness (generalized): Secondary | ICD-10-CM

## 2015-02-23 DIAGNOSIS — M25511 Pain in right shoulder: Secondary | ICD-10-CM

## 2015-02-23 NOTE — Therapy (Signed)
[place] [facility] [address], [place], [address] Phone: [phone]   Fax:  [phone]  Physical Therapy Treatment  Patient Details  Name: Teresa [name] MRN: [id number] Date of Birth: 11/01/1945 Referring Provider:  Melrose Nakayama, MD  Encounter Date: 02/23/2015      PT End of Session - 02/23/15 0920    Visit Number 15   Number of Visits 16   Date for PT Re-Evaluation 03/03/15   Authorization Type 15   Authorization Time Period 20   PT Start Time 0800   PT Stop Time 0850   PT Time Calculation (min) 50 min   Activity Tolerance Patient limited by pain   Behavior During Therapy [facility] for tasks assessed/performed      History reviewed. No pertinent past medical history.  History reviewed. No pertinent past surgical history.  There were no vitals filed for this visit.  Visit Diagnosis:  Right shoulder pain  Muscle weakness of right upper extremity      Subjective Assessment - 02/23/15 0805    Subjective Still having pain at rest and with all motion of right UE. She also reports she is still having numbness in her right hand intermittently throughout the day and at night. She is still feeling weak and cannot lift anything.    Currently in Pain? Yes   Pain Score 7    Pain Location Shoulder   Pain Orientation Right   Pain Descriptors / Indicators Aching  popping/cracking in shoulder with movement   Pain Type Surgical pain   Pain Frequency --  constanct pain in shoulder, intermittent tingling/numbness into hand   Multiple Pain Sites No     Objective: additional referral received to continue physical therapy x 6 weeks and add iontophoresis to treatment Instructed patient in precautions, indication for iontophoresis with patient consenting to treatment          [facility] Adult PT Treatment/Exercise - 02/23/15 0807    Exercises   Exercises Other Exercises   Other Exercises  seated isometric  exercises right shoulder: ER, extension, forward elevation at 90 degrees scaption 3 sets 5 reps each 5 second holds, seated scapular row with 5# with assistance of therapist 3 sets x 5-10 second holds, instructed patient in isometric shoulder exercises for home using yellow resistive band and performed with verbal cuing, following demonstration 2 reps each   Modalities   Modalities Electrical Stimulation;Iontophoresis   Cryotherapy   Cryotherapy Location Shoulder   Electrical Stimulation   Electrical Stimulation Location right shoulder   Electrical Stimulation Parameters Russian stim. applied to medial border of right scapula with patient seated, intensity to contraction, 10/10 cycle    Electrical Stimulation Goals Neuromuscular facilitation;Pain   Ultrasound   Ultrasound Goals Pain   Iontophoresis   Type of Iontophoresis Dexamethasone   Location anterior aspect right shoulder   Dose 74m.min   Time 10 min application; 25 min. following application       Patient response to treatment: no adverse effects noted from iontophoresis, able to perform exercises with assistance and verbal cuing with pain throughout all exercises         PT Education - 02/23/15 0830    Education provided Yes   Education Details re assessed isometric exercises wtih resistive band    Person(s) Educated Patient   Methods Explanation;Demonstration;Verbal cues   Comprehension Verbalized understanding;Returned demonstration;Verbal cues required             PT Long Term  Goals - 01/29/15 1440    PT LONG TERM GOAL #1   Title Patient will improve quicDASH to 50% or better demonstraing improvement with self perceived disability with ADL's by 01/29/2015   Baseline quickDash 62%   Status Not Met   PT LONG TERM GOAL #2   Title Patient will report decreased pain level to 3/10 max in right shoulder with reaching activities to allow her to perform hair care and reach into cabinets by 01/29/2015    Baseline Pain  level 6/10   Status Not Met   PT LONG TERM GOAL #3   Title Patient will improve quickDASH to 30% or better indicating improved self perceived disabilty with using right UE by 03/03/2015   Baseline 62%    Status On-going   PT LONG TERM GOAL #4   Title Patient will be independent with home program for pain control and self management of symptoms for right UE without cuing by 03/03/2015   Baseline limited knowledge of appropriate pain control strategies and exercise progression   Status On-going               Plan - 02/23/15 0900    Clinical Impression Statement Patient tolerated session well without adverse reaction to iontophoresis treatment. She continues with significant pain in right shoulder that is her primary limiting factor to progress strengthening exercises and to perform daily household and personal chores at home. She will continue to require physical therpay intervention to address limitations in order for her to return to full functional use of right shoulder/UE with minimal pain.    Pt will benefit from skilled therapeutic intervention in order to improve on the following deficits Decreased strength;Increased muscle spasms;Impaired flexibility;Impaired UE functional use;Pain;Decreased range of motion   Rehab Potential Good   PT Frequency 2x / week   PT Duration 8 weeks   PT Treatment/Interventions Manual techniques;Cryotherapy;Therapeutic exercise;Electrical Stimulation;Scar mobilization;Passive range of motion;Moist Heat;Ultrasound   PT Next Visit Plan pain control, manual therapy techniques, ther. ex.        Problem List Patient Active Problem List   Diagnosis Date Noted  . HYPERLIPIDEMIA 06/21/2009  . HYPERTENSION 06/21/2009  . 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 FEVER UNSPECIFIED 06/21/2009  . NAUSEA 06/21/2009  . DYSPHAGIA 06/21/2009  . RUQ PAIN 06/21/2009  . HIATAL HERNIA 01/07/2008  . HYPOTHYROIDISM 11/22/2007  . ANXIETY 11/22/2007  . DEPRESSION  11/22/2007  . HYPERTENSIVE CARDIOVASCULAR DISEASE 11/22/2007  . 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 RENAL CALCULUS 11/22/2007  . DEGENERATIVE JOINT DISEASE 11/22/2007  . SLEEP APNEA 11/22/2007  . HEADACHE, CHRONIC 11/22/2007  . GASTRITIS 10/23/2007  . DIVERTICULOSIS, COLON 10/23/2007  . DIABETES MELLITUS 07/16/2007  . HYPERCHOLESTEROLEMIA 07/16/2007  . OBESITY, MODERATE 07/16/2007  . ISCHEMIC HEART DISEASE 07/16/2007  . ESOPHAGEAL REFLUX 07/16/2007  . SCLERODERMA 07/16/2007  . DYSPNEA 07/16/2007  . COUGH, CHRONIC 07/16/2007    Jomarie Longs PT 02/23/2015, 9:24 AM  [place] [facility] [address], [place], [address] Phone: [phone]   Fax:  [phone]

## 2015-02-26 ENCOUNTER — Encounter: Payer: PPO | Admitting: Physical Therapy

## 2015-03-02 ENCOUNTER — Encounter: Payer: PPO | Admitting: Physical Therapy

## 2015-03-04 ENCOUNTER — Ambulatory Visit: Payer: PPO | Admitting: Physical Therapy

## 2015-03-05 ENCOUNTER — Encounter: Payer: Self-pay | Admitting: Physical Therapy

## 2015-03-05 ENCOUNTER — Ambulatory Visit: Payer: PPO | Admitting: Physical Therapy

## 2015-03-05 DIAGNOSIS — M25511 Pain in right shoulder: Secondary | ICD-10-CM

## 2015-03-05 DIAGNOSIS — M6281 Muscle weakness (generalized): Secondary | ICD-10-CM

## 2015-03-05 NOTE — Therapy (Signed)
[place] [facility] [address], [place], [address] Phone: [phone]   Fax:  [phone]  Physical Therapy Treatment  Patient Details  Name: Teresa [name] MRN: [id number] Date of Birth: February 26, 1946 Referring Provider:  Melrose Nakayama, MD  Encounter Date: 03/05/2015      PT End of Session - 03/05/15 0827    Visit Number 16   Number of Visits 32   Date for PT Re-Evaluation 04/30/15   Authorization Type 16   Authorization Time Period 20   PT Start Time 0750   PT Stop Time 0840   PT Time Calculation (min) 50 min   Activity Tolerance Patient limited by pain   Behavior During Therapy [facility] for tasks assessed/performed      History reviewed. No pertinent past medical history.  History reviewed. No pertinent past surgical history.  There were no vitals filed for this visit.  Visit Diagnosis:  Right shoulder pain - Plan: PT plan of care cert/re-cert  Muscle weakness of right upper extremity - Plan: PT plan of care cert/re-cert      Subjective Assessment - 03/05/15 0818    Subjective Still having pain at rest and with all motion of right UE. She also reports she is still having numbness in her right hand intermittently throughout the day and at night. She is still feeling weak and cannot lift anything.    Patient Stated Goals to return to prior level of function for using right arm   Currently in Pain? Yes   Pain Score 8    Pain Location Shoulder   Pain Orientation Right   Pain Descriptors / Indicators Aching   Pain Type Surgical pain   Pain Onset More than a month ago   Pain Frequency Other (Comment)  constant in right shoulder with intermittent tingling/numbness into right hand   Multiple Pain Sites No      Objective: Posture: forward right shoulder, hiking right shoulder QuickDash: 70% Grip strength right 15#, left 40# (note right improved with repetition following testing of left hand)       [facility] Adult PT Treatment/Exercise - 03/05/15 0819    Exercises   Exercises Other Exercises   Other Exercises  Re assessed seated isometric exercises right shoulder: ER, extension, forward elevation, instructed in pendulums, AROM at wall for shoulder extension and to perform all exercises with left UE then switch to right UE to improve results, pass object like a ruler around trunk to improve rotations (performed in clinic with audible pop in right shoulder following 3rd rep) no increased pain in shoulder reported following this exercise,     Modalities   Modalities Electrical Stimulation;Iontophoresis   Cryotherapy   Number Minutes Cryotherapy 20 Minutes   Cryotherapy Location Shoulder   Type of Cryotherapy Ice pack   Electrical Stimulation   Electrical Stimulation Location right shoulder   Electrical Stimulation Parameters high volt (2) electrodes applied to snterior and posterior aspect fo shoulder, High volt estim applied to posterior upper back along medial border of right scapula   Electrical Stimulation Goals Neuromuscular facilitation;Pain   Ultrasound   Ultrasound Location right shoulder    Ultrasound Parameters 1MHz 50% pulsed @ 1.4 /cm2   Ultrasound Goals Pain   Iontophoresis   Type of Iontophoresis --   Location --   Dose --   Time --     Patient response to treatment: required guidance and demonstration to perform all exercises, all through pain, no worse following  session, continues with significant pain, mild decreased with US/estim. To 5/10           PT Education - 03/05/15 0827    Education provided Yes: instructed in pendulums for pain control and ROM and passing ruler or longer object around trunk to improve IR/ER and continue with isometric exercises and AROM (at wall to have target for extension exercise) Verbalized and demonstrated exercises following demonstration and verbal cuing             PT Long Term Goals - 03/05/15 0832    PT LONG TERM GOAL  #1   Title Patient will improve quicDASH to 50% or better demonstraing improvement with self perceived disability with ADL's by 03/30/15   Baseline quickDash 70%   Status Revised   PT LONG TERM GOAL #2   Title Patient will report decreased pain level to 3/10 max in right shoulder with reaching activities to allow her to perform hair care and reach into cabinets by 04/30/2015    Baseline Pain level 7/10   Status Revised   PT LONG TERM GOAL #3   Title Patient will improve quickDASH to 50% or better indicating improved self perceived disabilty with using right UE by 04/30/2015   Baseline 70%   Status Revised   PT LONG TERM GOAL #4   Title Patient will be independent with home program for pain control and self management of symptoms for right UE without cuing by 04/30/2015   Baseline limited knowledge of appropriate pain control strategies and exercise progression   Status On-going               Plan - 03/05/15 9924    Clinical Impression Statement Patient conitnues with primary limiting factor of pain in right shoulder, which ranges up to 7/10, s/p RCR right shoulder. She has functional ROM and decreased strength in right UE with grip strength 15#, decreased biceps strength and unable to raise right arm overhead due to pain in right shoulder. She is limited in all ADL's at home and in community because of this and will require continued physical therapy intervetnion to address deficits and progress towards return to full functional use of right UE.    Pt will benefit from skilled therapeutic intervention in order to improve on the following deficits Decreased strength;Increased muscle spasms;Impaired flexibility;Impaired UE functional use;Pain;Decreased range of motion   Rehab Potential Good   Clinical Impairments Affecting Rehab Potential recurrent rotator cuff tear, previous surgery within the past 6 months   PT Frequency 2x / week   PT Duration 8 weeks   PT Treatment/Interventions Manual  techniques;Cryotherapy;Therapeutic exercise;Electrical Stimulation;Scar mobilization;Passive range of motion;Moist Heat;Ultrasound   PT Next Visit Plan pain control, manual therapy techniques, ther. ex.        Problem List Patient Active Problem List   Diagnosis Date Noted  . HYPERLIPIDEMIA 06/21/2009  . HYPERTENSION 06/21/2009  . 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 FEVER UNSPECIFIED 06/21/2009  . NAUSEA 06/21/2009  . DYSPHAGIA 06/21/2009  . RUQ PAIN 06/21/2009  . HIATAL HERNIA 01/07/2008  . HYPOTHYROIDISM 11/22/2007  . ANXIETY 11/22/2007  . DEPRESSION 11/22/2007  . HYPERTENSIVE CARDIOVASCULAR DISEASE 11/22/2007  . 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 RENAL CALCULUS 11/22/2007  . DEGENERATIVE JOINT DISEASE 11/22/2007  . SLEEP APNEA 11/22/2007  . HEADACHE, CHRONIC 11/22/2007  . GASTRITIS 10/23/2007  . DIVERTICULOSIS, COLON 10/23/2007  . DIABETES MELLITUS 07/16/2007  . HYPERCHOLESTEROLEMIA 07/16/2007  . OBESITY, MODERATE 07/16/2007  . ISCHEMIC HEART DISEASE 07/16/2007  . ESOPHAGEAL  REFLUX 07/16/2007  . SCLERODERMA 07/16/2007  . DYSPNEA 07/16/2007  . COUGH, CHRONIC 07/16/2007    Jomarie Longs PT 03/05/2015, 9:02 AM  [place] [facility] [address] [place], [address] Phone: [phone]   Fax:  [phone]

## 2015-03-09 ENCOUNTER — Ambulatory Visit: Payer: PPO | Attending: Orthopaedic Surgery | Admitting: Physical Therapy

## 2015-03-09 DIAGNOSIS — M6281 Muscle weakness (generalized): Secondary | ICD-10-CM | POA: Diagnosis present

## 2015-03-09 DIAGNOSIS — M25511 Pain in right shoulder: Secondary | ICD-10-CM | POA: Diagnosis present

## 2015-03-10 NOTE — Therapy (Signed)
[place] [facility] [address], [place], [address] Phone: [phone]   Fax:  [phone]  Physical Therapy Treatment  Patient Details  Name: Teresa [name] MRN: [id number] Date of Birth: 1945-08-08 Referring Provider:  Melrose Nakayama, MD  Encounter Date: 03/09/2015      PT End of Session - 03/09/15 0859    Visit Number 17   Number of Visits 32   Date for PT Re-Evaluation 04/30/15   Authorization Type 17   Authorization Time Period 20   PT Start Time 0805   PT Stop Time 0850   PT Time Calculation (min) 45 min   Activity Tolerance Patient limited by fatigue   Behavior During Therapy [facility] for tasks assessed/performed      No past medical history on file.  No past surgical history on file.  There were no vitals filed for this visit.  Visit Diagnosis:  Right shoulder pain  Muscle weakness      Subjective Assessment - 03/09/15 0810    Subjective Still having pain at rest and with all motion of right UE. She also reports she is still having numbness in her right hand intermittently throughout the day and at night. She is still feeling weak and cannot lift anything. She is going for follow up with MD next week.    Patient Stated Goals to return to prior level of function for using right arm   Currently in Pain? Yes   Pain Score 8    Pain Location Shoulder  also has stiffness upper trapezius into cervical spine       Treatment: Manual therapy: STM cervical spine/upper trapezius right side with patient seated in chair followed by scapular adduction and lateral flexion with upper trapezius release performed. Cervical manual traction performed x 5 seconds x 10 reps Ultrasound: 1 MHz pulsed 50% 1.4w/cm2 to right shoulder with patient seated Electrical stimulation: high volt estim. Applied to anterior and posterior aspect of right shoulder continuous mode, Russian stim.applied to right side shoulder/medial  border of scapula 10/10 cycle x 20 min. With patient seated with ice pack applied to same  Reassessed home exercises to continue: scapular adduction, isometric exercises as able, pulley with patient facing towards pulleys for forward elevation and scapular rowing: demonstration performed with patient verbalizing understanding  Patient response to treatment: decreased spasms palpable along right side cervical spine/upper trapezius 50% or more, patient reported decreased soreness in shoulder  To 6/10 following US/estim. And it is still painful to raise arm forward         PT Education - 03/09/15 0858    Education provided Yes   Education Details pulley for home   Person(s) Educated Patient   Methods Explanation;Demonstration   Comprehension Verbalized understanding             PT Long Term Goals - 03/05/15 0832    PT LONG TERM GOAL #1   Title Patient will improve quicDASH to 50% or better demonstraing improvement with self perceived disability with ADL's by 03/30/15   Baseline quickDash 70%   Status Revised   PT LONG TERM GOAL #2   Title Patient will report decreased pain level to 3/10 max in right shoulder with reaching activities to allow her to perform hair care and reach into cabinets by 04/30/2015    Baseline Pain level 7/10   Status Revised   PT LONG TERM GOAL #3   Title Patient will improve quickDASH to 50% or better  indicating improved self perceived disabilty with using right UE by 04/30/2015   Baseline 70%   Status Revised   PT LONG TERM GOAL #4   Title Patient will be independent with home program for pain control and self management of symptoms for right UE without cuing by 04/30/2015   Baseline limited knowledge of appropriate pain control strategies and exercise progression   Status On-going               Plan - 03/09/15 0900    Clinical Impression Statement Patient continues with primary limiting factor of pain in right shoulder s/p RCR. She cannot lift  or perform personal care or household chores without difficulty. She is responding favorably to current treatment however she is progressing slowly.    Pt will benefit from skilled therapeutic intervention in order to improve on the following deficits Decreased strength;Increased muscle spasms;Impaired flexibility;Impaired UE functional use;Pain;Decreased range of motion   Rehab Potential Good   PT Frequency 2x / week   PT Duration 8 weeks   PT Treatment/Interventions Manual techniques;Cryotherapy;Therapeutic exercise;Electrical Stimulation;Scar mobilization;Passive range of motion;Moist Heat;Ultrasound   PT Next Visit Plan pain control, manual therapy techniques, ther. ex.        Problem List Patient Active Problem List   Diagnosis Date Noted  . HYPERLIPIDEMIA 06/21/2009  . HYPERTENSION 06/21/2009  . 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 FEVER UNSPECIFIED 06/21/2009  . NAUSEA 06/21/2009  . DYSPHAGIA 06/21/2009  . RUQ PAIN 06/21/2009  . HIATAL HERNIA 01/07/2008  . HYPOTHYROIDISM 11/22/2007  . ANXIETY 11/22/2007  . DEPRESSION 11/22/2007  . HYPERTENSIVE CARDIOVASCULAR DISEASE 11/22/2007  . 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 RENAL CALCULUS 11/22/2007  . DEGENERATIVE JOINT DISEASE 11/22/2007  . SLEEP APNEA 11/22/2007  . HEADACHE, CHRONIC 11/22/2007  . GASTRITIS 10/23/2007  . DIVERTICULOSIS, COLON 10/23/2007  . DIABETES MELLITUS 07/16/2007  . HYPERCHOLESTEROLEMIA 07/16/2007  . OBESITY, MODERATE 07/16/2007  . ISCHEMIC HEART DISEASE 07/16/2007  . ESOPHAGEAL REFLUX 07/16/2007  . SCLERODERMA 07/16/2007  . DYSPNEA 07/16/2007  . COUGH, CHRONIC 07/16/2007    Jomarie Longs PT 03/10/2015, 8:30 AM  [place] [facility] [address] [place], [address] Phone: [phone]   Fax:  [phone]

## 2015-03-11 ENCOUNTER — Ambulatory Visit: Payer: PPO | Admitting: Physical Therapy

## 2015-03-11 ENCOUNTER — Encounter: Payer: PPO | Admitting: Physical Therapy

## 2015-03-16 ENCOUNTER — Ambulatory Visit: Payer: PPO | Admitting: Physical Therapy

## 2015-03-16 DIAGNOSIS — M6281 Muscle weakness (generalized): Secondary | ICD-10-CM

## 2015-03-16 DIAGNOSIS — M25511 Pain in right shoulder: Secondary | ICD-10-CM

## 2015-03-16 NOTE — Therapy (Signed)
[facility] [address], [place], [address] Phone: [phone]   Fax:  [phone]  Physical Therapy Treatment  Patient Details  Name: Teresa [name] MRN: [id number] Date of Birth: [birth year] Referring Provider:  Melrose Nakayama, MD  Encounter Date: 03/16/2015      PT End of Session - 03/16/15 0853    Visit Number 18   Date for PT Re-Evaluation 04/30/15   Authorization Type 18   Authorization Time Period 20   PT Start Time 0800   PT Stop Time 0850   PT Time Calculation (min) 50 min   Activity Tolerance Patient limited by pain   Behavior During Therapy [facility] for tasks assessed/performed      No past medical history on file.  No past surgical history on file.  There were no vitals filed for this visit.  Visit Diagnosis:  Right shoulder pain  Muscle weakness      Subjective Assessment - 03/16/15 0830    Subjective Still having pain at rest and with all motion of right UE. She also reports she is still having numbness in her right hand intermittently throughout the day and at night. She is still feeling weak and cannot lift anything. She is going for follow up with MD this week. Her numbness is along back of forearm and hand all fingers.    Limitations Lifting;House hold activities   Patient Stated Goals to return to prior level of function for using right arm   Currently in Pain? Yes   Pain Score 8    Pain Location Shoulder   Pain Orientation Right   Pain Descriptors / Indicators Aching;Tingling   Pain Type Chronic pain;Surgical pain   Pain Onset More than a month ago   Pain Frequency Other (Comment)  intermittent into right UE to hand: numbness and tingling      Objective: QuickDash: 61%  (was 71% initially indicating significant improvement with change of 10%)  AAROM: right shoulder WNL's with pain >70 degrees flexion and with IR behind back Strength: painful and strong with forward  elevation, rotations, unable to test abduction due to increased pain level with any movement into abduction; she has decreased motor control with all scapula exercises and his seems to be improving Grip strength: left 40#, right 10#,15# due to pain in right UE        [facility] Adult PT Treatment/Exercise - 03/16/15 0835    Exercises   Exercises Other Exercises   Other Exercises  Seated scapular rows and isometric shoulder flexion, rotations 2 x 5 reps with moderate manual resistance, reassessed home exercises for ROM and posture exercises at wall, use of pulleys for AAROM   Modalities   Modalities Electrical Stimulation;Iontophoresis   Cryotherapy   Number Minutes Cryotherapy 20 Minutes   Cryotherapy Location Shoulder   Type of Cryotherapy Ice pack   Electrical Stimulation   Electrical Stimulation Location right shoulder   Electrical Stimulation Parameters high volt (2) electrodes appllied to upper trapezius and lateral aspect of right shoulder over deltoid, continuous mode for muscle spasms, Russian stim 10/10 cycle applied along medial border of scapula, with patient seated in chair with towel roll under right axilla and arm supported on chair arm   Electrical Stimulation Goals Neuromuscular facilitation;Pain   Ultrasound   Ultrasound Location right shoulder   Ultrasound Parameters 3 MHZ pulsed 20% (2) points of tenderness along upper trapezius muscle with patient seated 3 min. each point, 0.8 w/cm2  Ultrasound Goals Pain   Manual Therapy   Manual Therapy Soft tissue mobilization   Manual therapy comments superficial techniques and deep techniques for reducing taut band and trigger points in upper trapezius muscle and entire deltoid with patient in seated position      Patient response to treatment: reports decreased pain in right shoulder with forward elevation following STM, decreased pain and soreness in shoulder with estim., patient continued with pain throughout exercises no worse  following treatment, requires verbal cuing and assistance to complete all exercises          PT Education - 03/16/15 [phone]    Education provided Yes   Education Details use of short arc movements to avoid increased pain in right shoulder and to perform with controlled motion   Person(s) Educated Patient   Methods Explanation;Demonstration;Verbal cues   Comprehension Verbalized understanding;Returned demonstration;Verbal cues required             PT Long Term Goals - 03/05/15 0832    PT LONG TERM GOAL #1   Title Patient will improve quicDASH to 50% or better demonstraing improvement with self perceived disability with ADL's by 03/30/15   Baseline quickDash 70%   Status Revised   PT LONG TERM GOAL #2   Title Patient will report decreased pain level to 3/10 max in right shoulder with reaching activities to allow her to perform hair care and reach into cabinets by 04/30/2015    Baseline Pain level 7/10   Status Revised   PT LONG TERM GOAL #3   Title Patient will improve quickDASH to 50% or better indicating improved self perceived disabilty with using right UE by 04/30/2015   Baseline 70%   Status Revised   PT LONG TERM GOAL #4   Title Patient will be independent with home program for pain control and self management of symptoms for right UE without cuing by 04/30/2015   Baseline limited knowledge of appropriate pain control strategies and exercise progression   Status On-going               Plan - 03/16/15 0854    Clinical Impression Statement Patient continues with primary limiting factor of pain in right shoulder s/p RCR and second surgery. She cannot lift or perform personal care or household chores without difficulty and pain. She is out of work because of pain and limititations. She was unable to continue with iontophoresis treatment due to diabetes and subsequent increased glucose levels. She reports therapy interventions are helping to alleviate pain for a while and  would like to continue for pain control and progressive exercises as tolerated.    Pt will benefit from skilled therapeutic intervention in order to improve on the following deficits Decreased strength;Increased muscle spasms;Impaired flexibility;Impaired UE functional use;Pain;Decreased range of motion   Rehab Potential Good   Clinical Impairments Affecting Rehab Potential recurrent rotator cuff tear, previous surgery within the past 6 months   PT Frequency 2x / week   PT Duration 8 weeks   PT Treatment/Interventions Manual techniques;Cryotherapy;Therapeutic exercise;Electrical Stimulation;Scar mobilization;Passive range of motion;Moist Heat;Ultrasound   PT Next Visit Plan pain control, manual therapy techniques, ther. ex.        Problem List Patient Active Problem List   Diagnosis Date Noted  . HYPERLIPIDEMIA 06/21/2009  . HYPERTENSION 06/21/2009  . 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 FEVER UNSPECIFIED 06/21/2009  . NAUSEA 06/21/2009  . DYSPHAGIA 06/21/2009  . RUQ PAIN 06/21/2009  . HIATAL HERNIA 01/07/2008  . HYPOTHYROIDISM 11/22/2007  .  ANXIETY 11/22/2007  . DEPRESSION 11/22/2007  . HYPERTENSIVE CARDIOVASCULAR DISEASE 11/22/2007  . 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 RENAL CALCULUS 11/22/2007  . DEGENERATIVE JOINT DISEASE 11/22/2007  . SLEEP APNEA 11/22/2007  . HEADACHE, CHRONIC 11/22/2007  . GASTRITIS 10/23/2007  . DIVERTICULOSIS, COLON 10/23/2007  . DIABETES MELLITUS 07/16/2007  . HYPERCHOLESTEROLEMIA 07/16/2007  . OBESITY, MODERATE 07/16/2007  . ISCHEMIC HEART DISEASE 07/16/2007  . ESOPHAGEAL REFLUX 07/16/2007  . SCLERODERMA 07/16/2007  . DYSPNEA 07/16/2007  . COUGH, CHRONIC 07/16/2007    Jomarie Longs PT 03/16/2015, 8:59 AM  [place] [facility] [address], [place], [address] Phone: [phone]   Fax:  [phone]

## 2015-03-18 ENCOUNTER — Encounter: Payer: PPO | Admitting: Physical Therapy

## 2015-03-19 ENCOUNTER — Ambulatory Visit: Payer: PPO | Admitting: Physical Therapy

## 2015-03-23 ENCOUNTER — Encounter: Payer: Self-pay | Admitting: Physical Therapy

## 2015-03-23 ENCOUNTER — Ambulatory Visit: Payer: PPO | Admitting: Physical Therapy

## 2015-03-23 DIAGNOSIS — M6281 Muscle weakness (generalized): Secondary | ICD-10-CM

## 2015-03-23 DIAGNOSIS — M25511 Pain in right shoulder: Secondary | ICD-10-CM

## 2015-03-23 NOTE — Therapy (Signed)
[place] [address], [place], [address] Phone: [phone]   Fax:  [phone]  Physical Therapy Treatment  Patient Details  Name: Teresa [name] MRN: [id number] Date of Birth: February 27, 1946 Referring Provider:  Melrose Nakayama, MD  Encounter Date: 03/23/2015      PT End of Session - 03/23/15 0839    Visit Number 19   Number of Visits 32   Date for PT Re-Evaluation 04/30/15   Authorization Type 19   Authorization Time Period 20   PT Start Time 0750   PT Stop Time 0835   PT Time Calculation (min) 45 min   Activity Tolerance Patient limited by pain   Behavior During Therapy [facility] for tasks assessed/performed      History reviewed. No pertinent past medical history.  History reviewed. No pertinent past surgical history.  There were no vitals filed for this visit.  Visit Diagnosis:  Right shoulder pain  Muscle weakness      Subjective Assessment - 03/23/15 0830    Subjective Still having pain at rest and with all motion of right UE. She also reports she is still having numbness in her right hand intermittently throughout the day and at night. She is still feeling weak and cannot lift anything. She had follow up with MD last week and is to continue with PT until end of this certification and reports she was told it will take time to heal because she had a lot of damage in her shoulder. She will be out of work [employer], [employer]) for at least 6 more weeks.     Currently in Pain? Yes   Pain Score 8    Pain Location Shoulder   Pain Orientation Right   Pain Descriptors / Indicators Aching;Tingling   Pain Type Chronic pain;Surgical pain   Pain Onset More than a month ago   Pain Frequency Other (Comment)  constant right shoulder with intermittent radiation of numbness to right hand                         [facility] Adult PT Treatment/Exercise - 03/23/15 0845    Exercises   Exercises Other Exercises    Other Exercises  Instructed patient in home exercises and modification to allow her to exercise within pain limitations: use pulleys   Modalities   Modalities Electrical Stimulation:cryotherapy   Cryotherapy   Number Minutes Cryotherapy 20 Minutes   Cryotherapy Location Shoulder   Type of Cryotherapy Ice pack   Electrical Stimulation   Electrical Stimulation Location right shoulder: high volt estim. Applied to anterior and upper trapezius right shoulder, russian stim applied to right shoulder medial border of scapula and lower trapezius muscle with patient instructed to perform muscle contraction with each cycle   Electrical Stimulation Goals Neuromuscular facilitation;Pain   Ultrasound   Ultrasound Location right shoulder   Ultrasound Parameters 1MHz continuous 1.4w/cm2 x 10 min.   Ultrasound Goals Pain                Patient response to treatment: patient reported decreased pain ~25% and able to move right UE with less difficulty following treatment, required verbal instruction and guidance to adjust exercises to control pain, continues with increased pain as patient performed forward elevation at 90 degrees and above           PT Education - 03/23/15 0830    Education provided Yes   Education Details Instructed to continue  with exercises as pain permits, modify exercises to allow keeping ROM    Person(s) Educated Patient   Methods Explanation   Comprehension Verbalized understanding             PT Long Term Goals - 03/05/15 7124    PT LONG TERM GOAL #1   Title Patient will improve quicDASH to 50% or better demonstraing improvement with self perceived disability with ADL's by 03/30/15   Baseline quickDash 70%   Status Revised   PT LONG TERM GOAL #2   Title Patient will report decreased pain level to 3/10 max in right shoulder with reaching activities to allow her to perform hair care and reach into cabinets by 04/30/2015    Baseline Pain level 7/10   Status Revised    PT LONG TERM GOAL #3   Title Patient will improve quickDASH to 50% or better indicating improved self perceived disabilty with using right UE by 04/30/2015   Baseline 70%   Status Revised   PT LONG TERM GOAL #4   Title Patient will be independent with home program for pain control and self management of symptoms for right UE without cuing by 04/30/2015   Baseline limited knowledge of appropriate pain control strategies and exercise progression   Status On-going               Plan - 03/23/15 0841    Clinical Impression Statement Patient continues with pain as primary limitation to full function with right UE use s/p RTC surgery. She continues to respond favorably to US/Estim. for pain control and is exercising at home with pulley and home program.    Pt will benefit from skilled therapeutic intervention in order to improve on the following deficits Decreased strength;Increased muscle spasms;Impaired flexibility;Impaired UE functional use;Pain;Decreased range of motion   Rehab Potential Good   Clinical Impairments Affecting Rehab Potential recurrent rotator cuff tear, previous surgery within the past 6 months   PT Frequency 2x / week   PT Duration 8 weeks   PT Treatment/Interventions Manual techniques;Cryotherapy;Therapeutic exercise;Electrical Stimulation;Scar mobilization;Passive range of motion;Moist Heat;Ultrasound        Problem List Patient Active Problem List   Diagnosis Date Noted  . HYPERLIPIDEMIA 06/21/2009  . HYPERTENSION 06/21/2009  . 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 FEVER UNSPECIFIED 06/21/2009  . NAUSEA 06/21/2009  . DYSPHAGIA 06/21/2009  . RUQ PAIN 06/21/2009  . HIATAL HERNIA 01/07/2008  . HYPOTHYROIDISM 11/22/2007  . ANXIETY 11/22/2007  . DEPRESSION 11/22/2007  . HYPERTENSIVE CARDIOVASCULAR DISEASE 11/22/2007  . 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 RENAL CALCULUS 11/22/2007  . DEGENERATIVE JOINT DISEASE 11/22/2007  . SLEEP APNEA 11/22/2007  . HEADACHE,  CHRONIC 11/22/2007  . GASTRITIS 10/23/2007  . DIVERTICULOSIS, COLON 10/23/2007  . DIABETES MELLITUS 07/16/2007  . HYPERCHOLESTEROLEMIA 07/16/2007  . OBESITY, MODERATE 07/16/2007  . ISCHEMIC HEART DISEASE 07/16/2007  . ESOPHAGEAL REFLUX 07/16/2007  . SCLERODERMA 07/16/2007  . DYSPNEA 07/16/2007  . COUGH, CHRONIC 07/16/2007    Jomarie Longs PT 03/23/2015, 10:38 PM  [place] [facility] [address] [place], [address] Phone: [phone]   Fax:  [phone]

## 2015-03-25 ENCOUNTER — Encounter: Payer: PPO | Admitting: Physical Therapy

## 2015-03-26 ENCOUNTER — Encounter: Payer: Self-pay | Admitting: Physical Therapy

## 2015-03-26 ENCOUNTER — Ambulatory Visit: Payer: PPO | Admitting: Physical Therapy

## 2015-03-26 DIAGNOSIS — M25511 Pain in right shoulder: Secondary | ICD-10-CM

## 2015-03-26 DIAGNOSIS — M6281 Muscle weakness (generalized): Secondary | ICD-10-CM

## 2015-03-26 NOTE — Therapy (Signed)
[facility] [address], [place], [address] Phone: [phone]   Fax:  [phone]  Physical Therapy Treatment  Patient Details  Name: Teresa [name] MRN: [id number] Date of Birth: November 02, 1945 Referring Provider:  Melrose Nakayama, MD  Encounter Date: 03/26/2015      PT End of Session - 03/26/15 0845    Visit Number 20   Number of Visits 32   Date for PT Re-Evaluation 04/30/15   Authorization Type 20   Authorization Time Period 20   PT Start Time 0800   PT Stop Time 0845   PT Time Calculation (min) 45 min   Activity Tolerance Patient limited by pain   Behavior During Therapy [facility] for tasks assessed/performed      History reviewed. No pertinent past medical history.  History reviewed. No pertinent past surgical history.  There were no vitals filed for this visit.  Visit Diagnosis:  Right shoulder pain  Muscle weakness      Subjective Assessment - 03/26/15 0805    Subjective Still having pain at rest and with all motion of right UE. She also reports she is still having numbness in her right hand intermittently throughout the day and at night. She is still feeling weak and cannot lift anything. She feels she is still benefiting from therapy and has less pain today than previous session     Currently in Pain? Yes   Pain Score 6    Pain Location Shoulder   Pain Orientation Right   Pain Descriptors / Indicators Aching;Tingling   Pain Type Chronic pain   Pain Onset More than a month ago   Pain Frequency Other (Comment)  intermittent numbness to right hand   Aggravating Factors  still having difficulty with turning wheel with driving, minimal improvement noted   Pain Relieving Factors ice, rest   Effect of Pain on Daily Activities affects all activities using right UE   Multiple Pain Sites No      Objective: AROM: limited in all planes of motion right shoulder due to increased pain, unable to  tolerate any abduction from initial movement from side of trunk, better if at 90 degrees with assistance, hiking shoulder throughout attempts at moving right shoulder/UE                   [facility] Adult PT Treatment/Exercise - 03/26/15 0835    Exercises   Exercises Other Exercises   Other Exercises  sitting isometric exercise with manual resistance given by therapist for graded resistance as tolerated, mild for rotations, moderate for forward elevation, none for holding in 90 degrees of abduction, moderate for extension   Modalities   Modalities Electrical Stimulation;Cryotherapy   Cryotherapy   Number Minutes Cryotherapy 20 Minutes   Cryotherapy Location Shoulder   Type of Cryotherapy Ice pack   Electrical Stimulation   Electrical Stimulation Location right shoulder   Electrical Stimulation Parameters russian stim. 10/10 cycle  to middle and anterior deltoid and lower trapezius and rhomboids right shoulder with patient seated   Electrical Stimulation Goals Neuromuscular facilitation;Pain   Ultrasound   Ultrasound Location right shoulder   Ultrasound Parameters 1MHz 1.3w/cm2 x 10 min. continuous    Ultrasound Goals Pain   Manual Therapy             Patient response to treatment: required verbal cues and guided resistance to perform exercises with minimal aggravation of pain today, keeping right shoulder in neutral positions in seated  position (patient has vertigo and cannot lie supine), decreased pain and improved ability to perform exercises following [place] and 25% less difficulty with moving right arm out to side following estim.            PT Education - 2015-04-19 [phone]    Education provided Yes   Education Details isometric exercises only in neutral positions and as pain free as possibel for now   Person(s) Educated Patient   Methods Explanation;Demonstration;Verbal cues   Comprehension Verbalized understanding;Returned demonstration             PT Long Term  Goals - 04-19-2015 0856    PT LONG TERM GOAL #1   Title Patient will improve quicDASH to 50% or better demonstraing improvement with self perceived disability with ADL's by 03/30/15   Baseline Quick Dash 65%   Status Partially Met   PT LONG TERM GOAL #2   Title Patient will report decreased pain level to 3/10 max in right shoulder with reaching activities to allow her to perform hair care and reach into cabinets by 04/30/2015    Baseline Pain level 7/10   Status Partially Met   PT LONG TERM GOAL #3   Title Patient will improve quickDASH to 50% or better indicating improved self perceived disabilty with using right UE by 04/30/2015   Baseline 70%   Status On-going   PT LONG TERM GOAL #4   Title Patient will be independent with home program for pain control and self management of symptoms for right UE without cuing by 04/30/2015   Baseline limited knowledge of appropriate pain control strategies and exercise progression   Status On-going               Plan - 19-Apr-2015 0854    Clinical Impression Statement Pain continues to be primary limiting factor to patient using right UE for daily tasks including driving, household chores and she continues to be out of work because of weakness/pain in right shoulder.  Her current impairment level is 55% based on patient interview, clinical judgment and ROM/strnegth deficits and pain scale. she is expected to continue towards goal of <30% limitation with continued  physical therapy intervention.    Pt will benefit from skilled therapeutic intervention in order to improve on the following deficits Decreased strength;Increased muscle spasms;Impaired flexibility;Impaired UE functional use;Pain;Decreased range of motion   Rehab Potential Good   Clinical Impairments Affecting Rehab Potential recurrent rotator cuff tear, previous surgery within the past 6 months   PT Frequency 2x / week   PT Duration 8 weeks   PT Treatment/Interventions Manual  techniques;Cryotherapy;Therapeutic exercise;Electrical Stimulation;Scar mobilization;Passive range of motion;Moist Heat;Ultrasound   PT Next Visit Plan pain control, manual therapy techniques, ther. Elenora Gamma - 2015/04/19 6387    Functional Assessment Tool Used QuickDASH, ROM, strenght deficits, clinical judgment, pain scale   Functional Limitation Carrying, moving and handling objects   Carrying, Moving and Handling Objects Current Status (F6433) At least 40 percent but less than 60 percent impaired, limited or restricted   Carrying, Moving and Handling Objects Goal Status (I9518) At least 20 percent but less than 40 percent impaired, limited or restricted      Problem List Patient Active Problem List   Diagnosis Date Noted  . HYPERLIPIDEMIA 06/21/2009  . HYPERTENSION 06/21/2009  . 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 FEVER UNSPECIFIED 06/21/2009  . NAUSEA 06/21/2009  . DYSPHAGIA 06/21/2009  . RUQ PAIN 06/21/2009  .  HIATAL HERNIA 01/07/2008  . HYPOTHYROIDISM 11/22/2007  . ANXIETY 11/22/2007  . DEPRESSION 11/22/2007  . HYPERTENSIVE CARDIOVASCULAR DISEASE 11/22/2007  . 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 RENAL CALCULUS 11/22/2007  . DEGENERATIVE JOINT DISEASE 11/22/2007  . SLEEP APNEA 11/22/2007  . HEADACHE, CHRONIC 11/22/2007  . GASTRITIS 10/23/2007  . DIVERTICULOSIS, COLON 10/23/2007  . DIABETES MELLITUS 07/16/2007  . HYPERCHOLESTEROLEMIA 07/16/2007  . OBESITY, MODERATE 07/16/2007  . ISCHEMIC HEART DISEASE 07/16/2007  . ESOPHAGEAL REFLUX 07/16/2007  . SCLERODERMA 07/16/2007  . DYSPNEA 07/16/2007  . COUGH, CHRONIC 07/16/2007    Jomarie Longs PT 03/26/2015, 8:59 AM  [place] [facility] [address] [place], [address] Phone: [phone]   Fax:  [phone]

## 2015-03-29 ENCOUNTER — Ambulatory Visit: Payer: PPO | Admitting: Physical Therapy

## 2015-03-29 ENCOUNTER — Encounter: Payer: Self-pay | Admitting: Physical Therapy

## 2015-03-29 DIAGNOSIS — M25511 Pain in right shoulder: Secondary | ICD-10-CM

## 2015-03-29 DIAGNOSIS — M6281 Muscle weakness (generalized): Secondary | ICD-10-CM

## 2015-03-29 NOTE — Therapy (Signed)
[facility] [address] [place], [address] Phone: [phone]   Fax:  [phone]  Physical Therapy Treatment  Patient Details  Name: Teresa [name] MRN: [id number] Date of Birth: Oct 21, 1945 Referring Provider:  Melrose Nakayama, MD  Encounter Date: 03/29/2015      PT End of Session - 03/29/15 0839    Visit Number 21   Number of Visits 32   Authorization Type 21   Authorization Time Period 87   PT Start Time 0802   PT Stop Time 0839   PT Time Calculation (min) 37 min   Activity Tolerance Patient limited by pain   Behavior During Therapy [facility] for tasks assessed/performed      History reviewed. No pertinent past medical history.  History reviewed. No pertinent past surgical history.  There were no vitals filed for this visit.  Visit Diagnosis:  Right shoulder pain  Muscle weakness      Subjective Assessment - 03/29/15 0803    Subjective Still having pain at rest and with all motion of right UE. She also reports she is still having numbness in her right hand intermittently throughout the day and at night. She is still feeling weak and cannot lift anything. She had follow up with MD last week and is to continue with PT until end of this certification and reports she was told it will take time to heal because she had a lot of damage in her shoulder. She will be out of work [employer], [employer]) for at least 6 more weeks.     Patient Stated Goals to return to prior level of function for using right arm   Currently in Pain? Yes   Pain Score 6    Pain Location Shoulder   Pain Orientation Right   Pain Descriptors / Indicators Aching;Tightness;Tingling   Pain Type Chronic pain   Pain Onset More than a month ago   Pain Frequency Constant   Multiple Pain Sites No       Objective: Grip strength: left 45#, right 10# with effort and pain limiting ability to perform test Palpation: +point tender around right shoulder  anterior/lateral and posterior aspect       [facility] Adult PT Treatment/Exercise - 03/29/15 0817    Exercises   Exercises Other Exercises (patient requested no exercises today 03/29/15 because of pain)   Other Exercises  sitting isometric exercise with manual resistance given by therapist for graded resistance as tolerated, mild for rotations, moderate for forward elevation, none for holding in 90 degrees of abduction, moderate for extension   Modalities   Modalities Electrical Stimulation;cryotherapy   Cryotherapy   Cryotherapy Location Shoulder right, ice pack x 20 min.   Acupuncturist Location right shoulder   Electrical Stimulation Parameters russian stim. 10/10 cycle applied to right shoulder anterior and middle deltoid and along medial border of right scapula with patient seated with ice applied to same   Electrical Stimulation Goals Neuromuscular facilitation;Pain   Ultrasound   Ultrasound Location right shoulder   Ultrasound Parameters 1MHz continuous to right shoulder 1.4w/cm2 x 10 min    Ultrasound Goals Pain   Manual Therapy   Manual Therapy --   Manual therapy comments --     Patient response to treatment: decreased pain reported overall to 5-6/10 from 8/10 and she is still having difficulty with using right UE due to pain and radiating numbness to right hand  PT Education - 03/29/15 [phone]    Education provided Yes   Education Details continue with pain control and isometric exercise as tolerated at home   Person(s) Educated Patient   Methods Explanation   Comprehension Verbalized understanding             PT Long Term Goals - 03/26/15 0856    PT LONG TERM GOAL #1   Title Patient will improve quicDASH to 50% or better demonstraing improvement with self perceived disability with ADL's by 03/30/15   Baseline Quick Dash 65%   Status Partially Met   PT LONG TERM GOAL #2   Title Patient will report decreased pain level  to 3/10 max in right shoulder with reaching activities to allow her to perform hair care and reach into cabinets by 04/30/2015    Baseline Pain level 7/10   Status Partially Met   PT LONG TERM GOAL #3   Title Patient will improve quickDASH to 50% or better indicating improved self perceived disabilty with using right UE by 04/30/2015   Baseline 70%   Status On-going   PT LONG TERM GOAL #4   Title Patient will be independent with home program for pain control and self management of symptoms for right UE without cuing by 04/30/2015   Baseline limited knowledge of appropriate pain control strategies and exercise progression   Status On-going               Plan - 03/29/15 0840    Clinical Impression Statement Patinet requested no exercise today as she is still having pain from last session when isometric exercises were performed. She tolerated pain control modalities well and reported pain level decreased 25%.    Pt will benefit from skilled therapeutic intervention in order to improve on the following deficits Decreased strength;Increased muscle spasms;Impaired flexibility;Impaired UE functional use;Pain;Decreased range of motion   Rehab Potential Good   PT Frequency 2x / week   PT Duration 8 weeks   PT Treatment/Interventions Manual techniques;Cryotherapy;Therapeutic exercise;Electrical Stimulation;Scar mobilization;Passive range of motion;Moist Heat;Ultrasound   PT Next Visit Plan pain control, manual therapy techniques, ther. ex.        Problem List Patient Active Problem List   Diagnosis Date Noted  . HYPERLIPIDEMIA 06/21/2009  . HYPERTENSION 06/21/2009  . 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 FEVER UNSPECIFIED 06/21/2009  . NAUSEA 06/21/2009  . DYSPHAGIA 06/21/2009  . RUQ PAIN 06/21/2009  . HIATAL HERNIA 01/07/2008  . HYPOTHYROIDISM 11/22/2007  . ANXIETY 11/22/2007  . DEPRESSION 11/22/2007  . HYPERTENSIVE CARDIOVASCULAR DISEASE 11/22/2007  . 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 RENAL CALCULUS 11/22/2007  . DEGENERATIVE JOINT DISEASE 11/22/2007  . SLEEP APNEA 11/22/2007  . HEADACHE, CHRONIC 11/22/2007  . GASTRITIS 10/23/2007  . DIVERTICULOSIS, COLON 10/23/2007  . DIABETES MELLITUS 07/16/2007  . HYPERCHOLESTEROLEMIA 07/16/2007  . OBESITY, MODERATE 07/16/2007  . ISCHEMIC HEART DISEASE 07/16/2007  . ESOPHAGEAL REFLUX 07/16/2007  . SCLERODERMA 07/16/2007  . DYSPNEA 07/16/2007  . COUGH, CHRONIC 07/16/2007    Jomarie Longs PT 03/29/2015, 9:44 AM  [facility] [address] [place], [address] Phone: [phone]   Fax:  [phone]

## 2015-03-30 ENCOUNTER — Encounter: Payer: PPO | Admitting: Physical Therapy

## 2015-04-01 ENCOUNTER — Encounter: Payer: Self-pay | Admitting: Physical Therapy

## 2015-04-01 ENCOUNTER — Ambulatory Visit: Payer: PPO | Admitting: Physical Therapy

## 2015-04-01 DIAGNOSIS — M6281 Muscle weakness (generalized): Secondary | ICD-10-CM

## 2015-04-01 DIAGNOSIS — M25511 Pain in right shoulder: Secondary | ICD-10-CM | POA: Diagnosis not present

## 2015-04-02 NOTE — Therapy (Signed)
[facility] [address] [place], [address] Phone: [phone]   Fax:  [phone]  Physical Therapy Treatment  Patient Details  Name: Teresa [name] MRN: [id number] Date of Birth: April 16, 1946 Referring Provider:  Melrose Nakayama, MD  Encounter Date: 04/01/2015      PT End of Session - 04/01/15 1000    Visit Number 22   Number of Visits 32   Date for PT Re-Evaluation 04/30/15   Authorization Type 22   Authorization Time Period 30   PT Start Time 0915   PT Stop Time 1000   PT Time Calculation (min) 45 min   Activity Tolerance Patient limited by pain   Behavior During Therapy [facility] for tasks assessed/performed      History reviewed. No pertinent past medical history.  History reviewed. No pertinent past surgical history.  There were no vitals filed for this visit.  Visit Diagnosis:  Right shoulder pain  Muscle weakness of right upper extremity      Subjective Assessment - 04/01/15 0917    Subjective Still having pain at rest and with all motion of right UE. She also reports she is still having numbness in her right hand intermittently throughout the day and at night. She is still feeling weak and cannot lift anything. She is still receiving pain control with current treatment.      Patient Stated Goals to return to prior level of function for using right arm   Currently in Pain? Yes   Pain Score 6 -7/10   Pain Location Shoulder   Pain Orientation Right   Pain Descriptors / Indicators Aching;Tightness;Spasm   Pain Type Chronic pain   Pain Onset More than a month ago      Objective: Grip strength right 10#, left 45# (right grip strength limited due to pain per patient report) AROM:  Right shoulder limited above 90 degrees elevation due to pain, ER and IR limited by pain (WFL's), Abduction <90 degrees with pain limiting further motion Strength: gross strength isometric in neutral positions: able  to tolerate moderate resistance forward elevation, ER/IR and abduction       [facility] Adult PT Treatment/Exercise - 04/01/15 2153    Exercises   Exercises Other Exercises   Other Exercises  Not performed today due to increased pain: sitting isometric exercise with manual resistance given by therapist for graded resistance as tolerated, mild for rotations, moderate for forward elevation, none for holding in 90 degrees of abduction, moderate for extension   Modalities   Modalities Electrical Stimulation   Cryotherapy   Cryotherapy Location Shoulder Ice pack applied to right shoulder during estim. treatment   Acupuncturist Location and parameters right shoulder Russian stim ro right shoulder anterior and lateral deltoid muscles and posterior aspect right shoulder medial border of scapula 10/10 cycle  X 20 min.   Electrical Stimulation Goals Neuromuscular facilitation;Pain   Ultrasound   Ultrasound parameters and Goals 1MHz 1.4 w/cm2 continuous mode to right shoulder anterior/posterior and superior aspects Pain       Patient response to treatment: decreased pain reported overall to 5/10 from 7/10 and she is still having difficulty with using right UE due to pain and radiating numbness to right hand, She is responding well with temporary relief of symptoms           PT Education - 04/01/15 0950    Education Details educated in progress with therapy, continue to work on  pain control at home and not to work on lifting exercises if right arm has pain and weaknesss, work on posture: decrease hiking of shoulder   Person(s) Educated Patient   Methods Explanation   Comprehension Verbalized understanding             PT Long Term Goals - 03/26/15 0856    PT LONG TERM GOAL #1   Title Patient will improve quicDASH to 50% or better demonstraing improvement with self perceived disability with ADL's by 03/30/15   Baseline Quick Dash 65%   Status Partially  Met   PT LONG TERM GOAL #2   Title Patient will report decreased pain level to 3/10 max in right shoulder with reaching activities to allow her to perform hair care and reach into cabinets by 04/30/2015    Baseline Pain level 7/10   Status Partially Met   PT LONG TERM GOAL #3   Title Patient will improve quickDASH to 50% or better indicating improved self perceived disabilty with using right UE by 04/30/2015   Baseline 70%   Status On-going   PT LONG TERM GOAL #4   Title Patient will be independent with home program for pain control and self management of symptoms for right UE without cuing by 04/30/2015   Baseline limited knowledge of appropriate pain control strategies and exercise progression   Status On-going               Plan - 04/01/15 1000    Clinical Impression Statement Patient limited by pain and does not tolerate any exercises well. Worked on decreasing spasms and imrpoving scapular control. Patient reports that [place] and estim. is helping to control pain/symptoms for a few days and overall she feels she id improving slowly.    Pt will benefit from skilled therapeutic intervention in order to improve on the following deficits Decreased strength;Increased muscle spasms;Impaired flexibility;Impaired UE functional use;Pain;Decreased range of motion   Rehab Potential Good   PT Frequency 2x / week   PT Duration 8 weeks   PT Treatment/Interventions Manual techniques;Cryotherapy;Therapeutic exercise;Electrical Stimulation;Scar mobilization;Passive range of motion;Moist Heat;Ultrasound   PT Next Visit Plan pain control, manual therapy techniques, ther. ex.        Problem List Patient Active Problem List   Diagnosis Date Noted  . HYPERLIPIDEMIA 06/21/2009  . HYPERTENSION 06/21/2009  . 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 FEVER UNSPECIFIED 06/21/2009  . NAUSEA 06/21/2009  . DYSPHAGIA 06/21/2009  . RUQ PAIN 06/21/2009  . HIATAL HERNIA 01/07/2008  . HYPOTHYROIDISM 11/22/2007   . ANXIETY 11/22/2007  . DEPRESSION 11/22/2007  . HYPERTENSIVE CARDIOVASCULAR DISEASE 11/22/2007  . 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 RENAL CALCULUS 11/22/2007  . DEGENERATIVE JOINT DISEASE 11/22/2007  . SLEEP APNEA 11/22/2007  . HEADACHE, CHRONIC 11/22/2007  . GASTRITIS 10/23/2007  . DIVERTICULOSIS, COLON 10/23/2007  . DIABETES MELLITUS 07/16/2007  . HYPERCHOLESTEROLEMIA 07/16/2007  . OBESITY, MODERATE 07/16/2007  . ISCHEMIC HEART DISEASE 07/16/2007  . ESOPHAGEAL REFLUX 07/16/2007  . SCLERODERMA 07/16/2007  . DYSPNEA 07/16/2007  . COUGH, CHRONIC 07/16/2007    Jomarie Longs PT 04/02/2015, 12:42 PM  [facility] [address] [place], [address] Phone: [phone]   Fax:  [phone]

## 2015-04-06 ENCOUNTER — Encounter: Payer: PPO | Admitting: Physical Therapy

## 2015-04-07 ENCOUNTER — Encounter: Payer: Self-pay | Admitting: Physical Therapy

## 2015-04-07 ENCOUNTER — Ambulatory Visit: Payer: PPO | Admitting: Physical Therapy

## 2015-04-07 DIAGNOSIS — M25511 Pain in right shoulder: Secondary | ICD-10-CM | POA: Diagnosis not present

## 2015-04-07 DIAGNOSIS — M6281 Muscle weakness (generalized): Secondary | ICD-10-CM

## 2015-04-07 NOTE — Therapy (Signed)
[facility] [address], [place], [address] Phone: [phone]   Fax:  [phone]  Physical Therapy Treatment  Patient Details  Name: Teresa [name] MRN: [id number] Date of Birth: [birth year] Referring Provider:  Melrose Nakayama, MD  Encounter Date: 04/07/2015      PT End of Session - 04/07/15 0834    Visit Number 23   Number of Visits 32   Date for PT Re-Evaluation 04/30/15   Authorization Type 23   Authorization Time Period 30   PT Start Time 0801   PT Stop Time 0846   PT Time Calculation (min) 45 min   Activity Tolerance Patient limited by pain   Behavior During Therapy [facility] for tasks assessed/performed      History reviewed. No pertinent past medical history.  History reviewed. No pertinent past surgical history.  There were no vitals filed for this visit.  Visit Diagnosis:  Right shoulder pain  Muscle weakness of right upper extremity  Muscle weakness      Subjective Assessment - 04/07/15 0833    Subjective Still having pain at rest and with all motion of right UE. She also reports she is still having numbness in her right hand intermittently throughout the day and at night. She is still feeling weak and cannot lift anything. She is about the same pain level as she has been for the past 2 weeks.    Limitations Lifting;House hold activities   Patient Stated Goals to return to prior level of function for using right arm   Currently in Pain? Yes   Pain Score 7    Pain Location Shoulder   Pain Orientation Right   Pain Descriptors / Indicators Aching   Pain Onset More than a month ago   Pain Frequency Constant          [facility] Adult PT Treatment/Exercise - 04/07/15 0833    Exercises   Exercises Other Exercises   Other Exercises  sitting isometric exercise with manual resistance given by therapist for graded resistance as tolerated, mild for rotations, moderate for forward elevation, none  for holding in 90 degrees of abduction, moderate for extension   Modalities   Modalities Electrical Stimulation;Iontophoresis   Cryotherapy   Number Minutes Cryotherapy 20 Minutes   Cryotherapy Location Shoulder   Type of Cryotherapy Ice pack   Electrical Stimulation   Electrical Stimulation Location right shoulder   Electrical Stimulation Parameters high volt (2) electrodes applied to anterior and posterior aspect of shoulder, Russian stim. 10/10 cycle applied along medial border of right scapula, with atient seated    Electrical Stimulation Goals Neuromuscular facilitation;Pain   Ultrasound   Ultrasound Location right shoulder   Ultrasound Parameters 1MHz continuous @ 1.4 w/cm2 x 10 min.    Ultrasound Goals Pain      Patient response to treatment: Patient required verbal and tactile cues with monitoring for pain during exercises.  patient demonstrated decreased spasms and pain by 25% per patient report with pain control modalities.         PT Education - 04/07/15 [phone]    Education provided No             PT Long Term Goals - 03/26/15 0856    PT LONG TERM GOAL #1   Title Patient will improve quicDASH to 50% or better demonstraing improvement with self perceived disability with ADL's by 03/30/15   Baseline Quick Dash 65%   Status Partially Met  PT LONG TERM GOAL #2   Title Patient will report decreased pain level to 3/10 max in right shoulder with reaching activities to allow her to perform hair care and reach into cabinets by 04/30/2015    Baseline Pain level 7/10   Status Partially Met   PT LONG TERM GOAL #3   Title Patient will improve quickDASH to 50% or better indicating improved self perceived disabilty with using right UE by 04/30/2015   Baseline 70%   Status On-going   PT LONG TERM GOAL #4   Title Patient will be independent with home program for pain control and self management of symptoms for right UE without cuing by 04/30/2015   Baseline limited knowledge of  appropriate pain control strategies and exercise progression   Status On-going               Plan - 04/07/15 0834    Clinical Impression Statement patient continues with pain that limits use of right UE for personal care and household chores. She is responding with temporary relief of pain in right shoulder and cannot tolerate much exercise besides isometric and even that increases her pain at this time.    Pt will benefit from skilled therapeutic intervention in order to improve on the following deficits Decreased strength;Increased muscle spasms;Impaired flexibility;Impaired UE functional use;Pain;Decreased range of motion   Rehab Potential Good   PT Frequency 2x / week   PT Duration 8 weeks   PT Treatment/Interventions Manual techniques;Cryotherapy;Therapeutic exercise;Electrical Stimulation;Scar mobilization;Passive range of motion;Moist Heat;Ultrasound   PT Next Visit Plan pain control, manual therapy techniques, ther. ex.        Problem List Patient Active Problem List   Diagnosis Date Noted  . HYPERLIPIDEMIA 06/21/2009  . HYPERTENSION 06/21/2009  . 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 FEVER UNSPECIFIED 06/21/2009  . NAUSEA 06/21/2009  . DYSPHAGIA 06/21/2009  . RUQ PAIN 06/21/2009  . HIATAL HERNIA 01/07/2008  . HYPOTHYROIDISM 11/22/2007  . ANXIETY 11/22/2007  . DEPRESSION 11/22/2007  . HYPERTENSIVE CARDIOVASCULAR DISEASE 11/22/2007  . 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 RENAL CALCULUS 11/22/2007  . DEGENERATIVE JOINT DISEASE 11/22/2007  . SLEEP APNEA 11/22/2007  . HEADACHE, CHRONIC 11/22/2007  . GASTRITIS 10/23/2007  . DIVERTICULOSIS, COLON 10/23/2007  . DIABETES MELLITUS 07/16/2007  . HYPERCHOLESTEROLEMIA 07/16/2007  . OBESITY, MODERATE 07/16/2007  . ISCHEMIC HEART DISEASE 07/16/2007  . ESOPHAGEAL REFLUX 07/16/2007  . SCLERODERMA 07/16/2007  . DYSPNEA 07/16/2007  . COUGH, CHRONIC 07/16/2007    Jomarie Longs PT 04/07/2015, 3:09 PM  [place] [facility] [address], [place], [address] Phone: [phone]   Fax:  [phone]

## 2015-04-09 ENCOUNTER — Encounter: Payer: Self-pay | Admitting: Physical Therapy

## 2015-04-09 ENCOUNTER — Ambulatory Visit: Payer: PPO | Attending: Orthopaedic Surgery | Admitting: Physical Therapy

## 2015-04-09 DIAGNOSIS — M6281 Muscle weakness (generalized): Secondary | ICD-10-CM | POA: Diagnosis present

## 2015-04-09 DIAGNOSIS — M25511 Pain in right shoulder: Secondary | ICD-10-CM

## 2015-04-09 NOTE — Therapy (Signed)
[place] [facility] [address], [place], [address] Phone: [phone]   Fax:  [phone]  Physical Therapy Treatment  Patient Details  Name: Teresa [name] MRN: [id number] Date of Birth: 04/17/1946 Referring Provider:  Melrose Nakayama, MD  Encounter Date: 04/09/2015      PT End of Session - 04/09/15 0846    Visit Number 24   Number of Visits 32   Date for PT Re-Evaluation 04/30/15   Authorization Type 24   Authorization Time Period 30   PT Start Time 0801   PT Stop Time 0844   PT Time Calculation (min) 43 min   Activity Tolerance Patient limited by pain   Behavior During Therapy [facility] for tasks assessed/performed      History reviewed. No pertinent past medical history.  History reviewed. No pertinent past surgical history.  There were no vitals filed for this visit.  Visit Diagnosis:  Right shoulder pain  Muscle weakness of right upper extremity      Subjective Assessment - 04/09/15 0803    Subjective Still having pain at rest and with all motion of right UE. She also reports she is still having numbness in her right hand intermittently throughout the day and at night. She is still feeling weak and is noticing slight improvement with strength. She reports she receives about 3 hours of relief from pain following therapy sessions.    Patient Stated Goals to return to prior level of function for using right arm   Currently in Pain? Yes   Pain Score 8    Pain Location Shoulder   Pain Orientation Right   Pain Descriptors / Indicators Aching   Pain Type Chronic pain   Pain Onset More than a month ago   Pain Frequency Constant      Objective: Grip strength: left 45#, 50#, right 15#, 15# AROM: slow movement overhead with right shoulder forward elevation with pain, limited IR behind back to side of hip due to increased pain          [facility] Adult PT Treatment/Exercise - 04/09/15 0837    Exercises   Exercises Other Exercises   Other Exercises  Instructed patient in lower trapezius motor control exercises in sitting and AAROM with lower trapezius muscle control standing at wall with demonstration and patient performing in sitting and verbalizing understanding of standing exercises.   Modalities   Modalities Electrical Stimulation;Ultrasound   Cryotherapy   Cryotherapy Location Shoulder, ice pack x 20 min. In conjunction with estim.    Acupuncturist Location and parameters right shoulder: high volt estim. Continuous mode for muscle spasm applied to anterior and lateral right shoulder and Russian stim 10/10 cycle applied to medial and inferior order of right scapula x 20 min.   Electrical Stimulation Goals Neuromuscular facilitation;Pain   Ultrasound   Ultrasound location/parameters and Goals Right shoulder continuous 1MHz @ 1.4w/cm2 for Pain      Patient response to treatment: Patient demonstrated improved ability to raise right arm overhead with assistance of therapist and with lower trapezius engaged. She continues with pain in right shoulder, decreased to feeling "numb" at end of session that lasts up to 3-4 hours per patient report           PT Education - 04/09/15 0839    Education provided Yes   Education Details educated patient in exercises for lower trapezius motor control exercise in sitting and at wall  Person(s) Educated Patient   Methods Explanation;Demonstration   Comprehension Verbalized understanding             PT Long Term Goals - 03/26/15 0856    PT LONG TERM GOAL #1   Title Patient will improve quicDASH to 50% or better demonstraing improvement with self perceived disability with ADL's by 03/30/15   Baseline Quick Dash 65%   Status Partially Met   PT LONG TERM GOAL #2   Title Patient will report decreased pain level to 3/10 max in right shoulder with reaching activities to allow her to perform hair care and reach into  cabinets by 04/30/2015    Baseline Pain level 7/10   Status Partially Met   PT LONG TERM GOAL #3   Title Patient will improve quickDASH to 50% or better indicating improved self perceived disabilty with using right UE by 04/30/2015   Baseline 70%   Status On-going   PT LONG TERM GOAL #4   Title Patient will be independent with home program for pain control and self management of symptoms for right UE without cuing by 04/30/2015   Baseline limited knowledge of appropriate pain control strategies and exercise progression   Status On-going               Plan - 04/09/15 0847    Clinical Impression Statement Patient continues with pain as primary limiting factor for exercise during therapy sessions. She is to rest on days she has therapy to see if there is improved carry over of pain control. Overall she reports she is noticing improving strength and continues with pain.    Pt will benefit from skilled therapeutic intervention in order to improve on the following deficits Decreased strength;Increased muscle spasms;Impaired flexibility;Impaired UE functional use;Pain;Decreased range of motion   Rehab Potential Good   PT Frequency 2x / week   PT Duration 8 weeks   PT Treatment/Interventions Manual techniques;Cryotherapy;Therapeutic exercise;Electrical Stimulation;Scar mobilization;Passive range of motion;Moist Heat;Ultrasound   PT Next Visit Plan pain control, manual therapy techniques, ther. ex.   PT Home Exercise Plan added wall lower trapezius exercise to home program         Problem List Patient Active Problem List   Diagnosis Date Noted  . HYPERLIPIDEMIA 06/21/2009  . HYPERTENSION 06/21/2009  . 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 FEVER UNSPECIFIED 06/21/2009  . NAUSEA 06/21/2009  . DYSPHAGIA 06/21/2009  . RUQ PAIN 06/21/2009  . HIATAL HERNIA 01/07/2008  . HYPOTHYROIDISM 11/22/2007  . ANXIETY 11/22/2007  . DEPRESSION 11/22/2007  . HYPERTENSIVE CARDIOVASCULAR DISEASE  11/22/2007  . 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 RENAL CALCULUS 11/22/2007  . DEGENERATIVE JOINT DISEASE 11/22/2007  . SLEEP APNEA 11/22/2007  . HEADACHE, CHRONIC 11/22/2007  . GASTRITIS 10/23/2007  . DIVERTICULOSIS, COLON 10/23/2007  . DIABETES MELLITUS 07/16/2007  . HYPERCHOLESTEROLEMIA 07/16/2007  . OBESITY, MODERATE 07/16/2007  . ISCHEMIC HEART DISEASE 07/16/2007  . ESOPHAGEAL REFLUX 07/16/2007  . SCLERODERMA 07/16/2007  . DYSPNEA 07/16/2007  . COUGH, CHRONIC 07/16/2007    Jomarie Longs PT 04/09/2015, 8:50 AM  [place] [facility] [address], [place], [address] Phone: [phone]   Fax:  [phone]

## 2015-04-13 ENCOUNTER — Encounter: Payer: PPO | Admitting: Physical Therapy

## 2015-04-14 ENCOUNTER — Ambulatory Visit: Payer: PPO | Admitting: Physical Therapy

## 2015-04-16 ENCOUNTER — Encounter: Payer: Self-pay | Admitting: Physical Therapy

## 2015-04-16 ENCOUNTER — Ambulatory Visit: Payer: PPO | Admitting: Physical Therapy

## 2015-04-16 DIAGNOSIS — M6281 Muscle weakness (generalized): Secondary | ICD-10-CM

## 2015-04-16 DIAGNOSIS — M25511 Pain in right shoulder: Secondary | ICD-10-CM | POA: Diagnosis not present

## 2015-04-16 NOTE — Therapy (Signed)
[place] [facility] [address], [place], [address] Phone: [phone]   Fax:  [phone]  Physical Therapy Treatment  Patient Details  Name: Teresa [name] MRN: [id number] Date of Birth: 11-09-1945 Referring Provider:  Melrose Nakayama, MD  Encounter Date: 04/16/2015      PT End of Session - 04/16/15 0900    Visit Number 24   Number of Visits 32   Date for PT Re-Evaluation 04/30/15   Authorization Type 24   Authorization Time Period 30   PT Start Time 0800   PT Stop Time 0845   PT Time Calculation (min) 45 min   Activity Tolerance Patient limited by pain   Behavior During Therapy [facility] for tasks assessed/performed      History reviewed. No pertinent past medical history.  History reviewed. No pertinent past surgical history.  There were no vitals filed for this visit.  Visit Diagnosis:  Right shoulder pain  Muscle weakness of right upper extremity      Subjective Assessment - 04/16/15 0805    Subjective Patient reports she continues with pain in right shoulder that is slowly improving. She feels that the [place] and estim. is still helping  to control her pain.    Patient Stated Goals to return to prior level of function for using right arm   Currently in Pain? Yes   Pain Score 7    Pain Location Shoulder   Pain Orientation Right   Pain Descriptors / Indicators Aching  intermittent numbness into right hand   Pain Type Chronic pain   Pain Onset More than a month ago   Pain Frequency Constant           [facility] Adult PT Treatment/Exercise - 04/16/15 0811    Exercises   Exercises Other Exercises   Other Exercises  Instructed in nerve glides for median and radial nerves to aide in decreasing pain in right UE and tingling and numbness into right UE, patient performed glides following demonstration and with verbal cues for correct positioning of UE at all joints; shoulder, elbow and wrist    Modalities   Modalities Electrical Stimulation;Ultrasound, cryotherapy   Cryotherapy   Number Minutes Cryotherapy 20 Minutes   Cryotherapy Location Shoulder   Type of Cryotherapy Ice pack   Electrical Stimulation   Electrical Stimulation Location right shoulder   Electrical Stimulation Parameters high volt (2) electrodes applied to anterior  and posterior aspect of shoulder, Russian stim. 10/10 cycle applied along medial border of scapula, with patient rigth UE supported   Electrical Stimulation Goals Neuromuscular facilitation;Pain   Ultrasound   Ultrasound Location right shoulder   Ultrasound Parameters 1MHz 100% continuous @ 1.4w/cm2  X 10 min. With patient seated in chair followed by estim.    Ultrasound Goals Pain     Patient response to treatment: improved pain level from 7/10 to 5/10 in right shoulder with [place], improved scapular control/abiltiy to perform scapular adduction with less difficulty following estim., continues with right UE/hand symptoms intermittently, increased burning into right upper arm with nerve glides for radial and median nerves and had to modify position of wrist to neutral to alleviate intensity of symptoms produced, patient returned demonstration with good technique            PT Education - 04/16/15 0845    Education provided Yes   Education Details reviewed healing process and how her strength is improving in right grip, educated in nerve glides for median  and radial nerves in right UE   Person(s) Educated Patient   Methods Explanation;Demonstration;Verbal cues   Comprehension Verbalized understanding;Returned demonstration;Verbal cues required;Need further instruction             PT Long Term Goals - 03/26/15 0856    PT LONG TERM GOAL #1   Title Patient will improve quicDASH to 50% or better demonstraing improvement with self perceived disability with ADL's by 03/30/15   Baseline Quick Dash 65%   Status Partially Met   PT LONG TERM GOAL #2   Title  Patient will report decreased pain level to 3/10 max in right shoulder with reaching activities to allow her to perform hair care and reach into cabinets by 04/30/2015    Baseline Pain level 7/10   Status Partially Met   PT LONG TERM GOAL #3   Title Patient will improve quickDASH to 50% or better indicating improved self perceived disabilty with using right UE by 04/30/2015   Baseline 70%   Status On-going   PT LONG TERM GOAL #4   Title Patient will be independent with home program for pain control and self management of symptoms for right UE without cuing by 04/30/2015   Baseline limited knowledge of appropriate pain control strategies and exercise progression   Status On-going               Plan - 04/16/15 0900    Clinical Impression Statement Patient is progressing with strength and decreased pain in right UE as demonstrated by improved grip strength from 15# last week to 23# this week. Pain is primary limiiting factor to improving function with rigth UE and she is responding favorably to current treatment of pain control with modalities and exercises as tolerated.    Pt will benefit from skilled therapeutic intervention in order to improve on the following deficits Decreased strength;Increased muscle spasms;Impaired flexibility;Impaired UE functional use;Pain;Decreased range of motion   Rehab Potential Good   PT Frequency 2x / week   PT Duration 8 weeks   PT Treatment/Interventions Manual techniques;Cryotherapy;Therapeutic exercise;Electrical Stimulation;Scar mobilization;Passive range of motion;Moist Heat;Ultrasound   PT Next Visit Plan pain control, manual therapy techniques, ther. ex.   PT Home Exercise Plan added nerve gliding to home program        Problem List Patient Active Problem List   Diagnosis Date Noted  . HYPERLIPIDEMIA 06/21/2009  . HYPERTENSION 06/21/2009  . 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 FEVER UNSPECIFIED 06/21/2009  . NAUSEA 06/21/2009  . DYSPHAGIA  06/21/2009  . RUQ PAIN 06/21/2009  . HIATAL HERNIA 01/07/2008  . HYPOTHYROIDISM 11/22/2007  . ANXIETY 11/22/2007  . DEPRESSION 11/22/2007  . HYPERTENSIVE CARDIOVASCULAR DISEASE 11/22/2007  . 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 RENAL CALCULUS 11/22/2007  . DEGENERATIVE JOINT DISEASE 11/22/2007  . SLEEP APNEA 11/22/2007  . HEADACHE, CHRONIC 11/22/2007  . GASTRITIS 10/23/2007  . DIVERTICULOSIS, COLON 10/23/2007  . DIABETES MELLITUS 07/16/2007  . HYPERCHOLESTEROLEMIA 07/16/2007  . OBESITY, MODERATE 07/16/2007  . ISCHEMIC HEART DISEASE 07/16/2007  . ESOPHAGEAL REFLUX 07/16/2007  . SCLERODERMA 07/16/2007  . DYSPNEA 07/16/2007  . COUGH, CHRONIC 07/16/2007    Jomarie Longs PT 04/16/2015, 12:25 PM  [place] [facility] [address] [place], [address] Phone: [phone]   Fax:  [phone]

## 2015-04-20 ENCOUNTER — Encounter: Payer: PPO | Admitting: Physical Therapy

## 2015-04-20 ENCOUNTER — Ambulatory Visit: Payer: PPO | Admitting: Physical Therapy

## 2015-04-20 DIAGNOSIS — M25511 Pain in right shoulder: Secondary | ICD-10-CM | POA: Diagnosis not present

## 2015-04-20 DIAGNOSIS — M6281 Muscle weakness (generalized): Secondary | ICD-10-CM

## 2015-04-20 NOTE — Therapy (Signed)
[place] [facility] [address], [place], [address] Phone: [phone]   Fax:  [phone]  Physical Therapy Treatment  Patient Details  Name: Teresa [name] MRN: [id number] Date of Birth: 01-16-1946 Referring Provider:  Melrose Nakayama, MD  Encounter Date: 04/20/2015      PT End of Session - 04/20/15 0945    Visit Number 25   Number of Visits 32   Date for PT Re-Evaluation 04/30/15   Authorization Type 24   Authorization Time Period 30   PT Start Time 0850   PT Stop Time 0945   PT Time Calculation (min) 55 min   Activity Tolerance Patient limited by pain   Behavior During Therapy [facility] for tasks assessed/performed      No past medical history on file.  No past surgical history on file.  There were no vitals filed for this visit.  Visit Diagnosis:  Right shoulder pain  Muscle weakness of right upper extremity      Subjective Assessment - 04/20/15 0851    Subjective Patient reports she is having increased soreness in right shoulder due to using it more at home over the weekend for household cleaning/moving furniture.    Patient Stated Goals to return to prior level of function for using right arm   Currently in Pain? Yes   Pain Score 8   cleaned her house and moved mattress over the weekend   Pain Location Shoulder   Pain Orientation Right   Pain Descriptors / Indicators Aching  continues with intermittent numbness into right hand   Pain Type Chronic pain   Pain Onset More than a month ago   Pain Frequency Constant   Multiple Pain Sites No       Objective: Posture: + rounded forward right shoulder, atrophy of deltoid, + hiking right shoulder Strength: decreased scapular control with adduction and downward rotation right shoulder and left shoulder, ER 4/5 tested isometrically in sitting AROM: right shoulder forward elevation overhead to 150 with pain at end range of motion, ER and IR decreased  due to pain       [facility] Adult PT Treatment/Exercise - 04/20/15 0857    Exercises   Exercises Other Exercises   Other Exercises  sitting isometric exercise with manual resistance given by therapist for graded resistance as tolerated, mild for rotations, scapular adduction with blue resistive band with home exercise in handout and verbal cues and demontration for correct technique and through short arc of motion with control, shoulder depression in chair with towels under elbows x 5 reps with home instruction as well   Modalities   Modalities Electrical Stimulation;Iontophoresis   Cryotherapy   Number Minutes Cryotherapy 20 Minutes   Cryotherapy Location Shoulder   Type of Cryotherapy Ice pack   Electrical Stimulation   Electrical Stimulation Location right shoulder   Electrical Stimulation Parameters high volt (2) electrodes applied to anterior and lateral shoulder and Russian stim. 10/10 cycle applied along medial border of right scapula with patien tin seated position , intensity to tolerance/contraction   Electrical Stimulation Goals Neuromuscular facilitation;Pain   Ultrasound   Ultrasound Location right shoulder   Ultrasound Parameters 1MHz continuous @ 1.4w/cm2 right shoulder and upper trapezius x 10 min.   Ultrasound Goals Pain     Patient response to treatment: decreased pain to 5/10 from 8/10 with US/estim., improved scapular control with repetition, demonstration and verbal cuing for exercises for correct alignment of shoulder/UE and correct performance through  short arc of motion        PT Education - 04/20/15 0949    Education Details instructed in scapular adduction with blue resistive tubing short arc with control, shoulder depression in chair and ER isometric exercises in chair as able   Person(s) Educated Patient   Methods Explanation;Demonstration;Verbal cues;Handout   Comprehension Verbalized understanding;Returned demonstration;Verbal cues required              PT Long Term Goals - 03/26/15 0856    PT LONG TERM GOAL #1   Title Patient will improve quicDASH to 50% or better demonstraing improvement with self perceived disability with ADL's by 03/30/15   Baseline Quick Dash 65%   Status Partially Met   PT LONG TERM GOAL #2   Title Patient will report decreased pain level to 3/10 max in right shoulder with reaching activities to allow her to perform hair care and reach into cabinets by 04/30/2015    Baseline Pain level 7/10   Status Partially Met   PT LONG TERM GOAL #3   Title Patient will improve quickDASH to 50% or better indicating improved self perceived disabilty with using right UE by 04/30/2015   Baseline 70%   Status On-going   PT LONG TERM GOAL #4   Title Patient will be independent with home program for pain control and self management of symptoms for right UE without cuing by 04/30/2015   Baseline limited knowledge of appropriate pain control strategies and exercise progression   Status On-going               Plan - 04/20/15 0951    Clinical Impression Statement Patient continues with pain as primary limiting factor for full functional use right UE. She is progressing with exercises for strength and control right shoulder girdle and will benefit from additional therapy for guidance, pain control in order to be able to transition to independent self management and return to full function.    Pt will benefit from skilled therapeutic intervention in order to improve on the following deficits Decreased strength;Increased muscle spasms;Impaired flexibility;Impaired UE functional use;Pain;Decreased range of motion   Rehab Potential Good   PT Frequency 2x / week   PT Duration 8 weeks   PT Treatment/Interventions Manual techniques;Cryotherapy;Therapeutic exercise;Electrical Stimulation;Scar mobilization;Passive range of motion;Moist Heat;Ultrasound   PT Next Visit Plan pain control, manual therapy techniques, ther. ex.   PT Home Exercise  Plan added controlled motion scapular adduction and shoulder depression exercises with handout        Problem List Patient Active Problem List   Diagnosis Date Noted  . HYPERLIPIDEMIA 06/21/2009  . HYPERTENSION 06/21/2009  . 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 FEVER UNSPECIFIED 06/21/2009  . NAUSEA 06/21/2009  . DYSPHAGIA 06/21/2009  . RUQ PAIN 06/21/2009  . HIATAL HERNIA 01/07/2008  . HYPOTHYROIDISM 11/22/2007  . ANXIETY 11/22/2007  . DEPRESSION 11/22/2007  . HYPERTENSIVE CARDIOVASCULAR DISEASE 11/22/2007  . 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 RENAL CALCULUS 11/22/2007  . DEGENERATIVE JOINT DISEASE 11/22/2007  . SLEEP APNEA 11/22/2007  . HEADACHE, CHRONIC 11/22/2007  . GASTRITIS 10/23/2007  . DIVERTICULOSIS, COLON 10/23/2007  . DIABETES MELLITUS 07/16/2007  . HYPERCHOLESTEROLEMIA 07/16/2007  . OBESITY, MODERATE 07/16/2007  . ISCHEMIC HEART DISEASE 07/16/2007  . ESOPHAGEAL REFLUX 07/16/2007  . SCLERODERMA 07/16/2007  . DYSPNEA 07/16/2007  . COUGH, CHRONIC 07/16/2007    Aldona Lento 04/20/2015, 10:03 AM  [place] [facility] [address], [place], [address] Phone:  [phone]   Fax:  [phone]

## 2015-04-23 ENCOUNTER — Ambulatory Visit: Payer: PPO | Admitting: Physical Therapy

## 2015-04-27 ENCOUNTER — Ambulatory Visit: Payer: PPO | Admitting: Physical Therapy

## 2015-04-27 ENCOUNTER — Encounter: Payer: Self-pay | Admitting: Physical Therapy

## 2015-04-27 DIAGNOSIS — M6281 Muscle weakness (generalized): Secondary | ICD-10-CM

## 2015-04-27 DIAGNOSIS — M25511 Pain in right shoulder: Secondary | ICD-10-CM | POA: Diagnosis not present

## 2015-04-27 NOTE — Therapy (Signed)
[place] [facility] [address] [place], [address] Phone: [phone]   Fax:  [phone]  Physical Therapy Treatment/Discharge Summary  Patient Details  Name: Teresa [name] MRN: [id number] Date of Birth: Mar 12, 1946 Referring Provider:  Melrose Nakayama, MD  Encounter Date: 04/27/2015   Patient began physical therapy and attended through 04/27/2015 with 27 visits. She has continued with primary limiting factor of pain and is now independent with home program and self management of symptoms and exercises and should continue to progress on own. Plan: discharge from physical therapy at this time with follow up with MD as needed.       PT End of Session - 04/27/15 0919    Visit Number 27   Number of Visits 32   Date for PT Re-Evaluation 04/30/15   Authorization Type 27   Authorization Time Period 30   PT Start Time 0801   PT Stop Time 0851   PT Time Calculation (min) 50 min   Activity Tolerance Patient limited by pain   Behavior During Therapy [facility] for tasks assessed/performed      History reviewed. No pertinent past medical history.  History reviewed. No pertinent past surgical history.  There were no vitals filed for this visit.  Visit Diagnosis:  Right shoulder pain  Muscle weakness of right upper extremity      Subjective Assessment - 04/27/15 0802    Subjective Patient reports she is still weak and having symptoms into right hand which makes it hard for her to grip things. She is feeling improvement with pain and strength but slowly. She agrees to continue on her own at this time and self manage symptoms and pain. She feels she is 50% improved since beginning therapy. Pain is her chief complaint.    Limitations Lifting;House hold activities   Patient Stated Goals to return to prior level of function for using right arm   Currently in Pain? Yes   Pain Score 6    Pain Location Shoulder   Pain Orientation  Right   Pain Descriptors / Indicators Aching;Other (Comment)  continues with intermittent numbness into right hand   Pain Type Chronic pain   Pain Onset More than a month ago   Pain Frequency Constant   Multiple Pain Sites No       Outcome measure: quickDash: 68% self perceived impairment with  Pain report as severe and limiting sleep, and daily activities still, and she continues with tingling into right hand  Grip strength: left 45#, right 23#  (initially and intermittently is 0-10# depending on hand stiffness and numbness in hand)  AROM: right shoulder forward elevation (0-150) and ER WFL's with pain, IR and Abduction limited due to pain  Strength: WFL's with isometric testing in mid ranges of forward elevation, ER/IR and  Abduction all limited by pain        [facility] Adult PT Treatment/Exercise - 04/27/15 0918    Exercises   Exercises Other Exercises   Other Exercises  Reviewed exercises for home: sitting isometric exercise   for rotations, forward elevation at wall,  scapular adduction with blue resistive band with home exercise in handout  through short arc of motion with control, shoulder depression in chair with towels under elbows x 5 reps with home instruction as well   Modalities   Modalities Electrical Stimulation   Cryotherapy   Cryotherapy Location Shoulder, right with estim. X 20 min.   Acupuncturist  Location and parameters right shoulder high volt (2) electrodes applied to anterior and lateral shoulder and Russian stim. 10/10 cycle applied along medial border of right scapula with patien tin seated position , intensity to tolerance/contraction   Electrical Stimulation Goals Neuromuscular facilitation;Pain   Ultrasound   Ultrasound location/parameters and Goals 1MHz continuous @ 1.4w/cm2 right shoulder and upper trapezius x 10 min. Pain      Manual therapy: right shoulder/upper trapezius and right hand STM to improved soft tissue  elasticity and decrease pain: improved grip from <10# to 23# following STM to hand, mild spasms noted in upper trapezius muscles and into right shoulder  Patient response to treatment: decreased pain to 6/10 from 4/10 with US/estim., improved grip strength,  required demonstration and minimal verbal cuing for exercises for correct alignment of shoulder/UE and correct performance through short arc of motion, demonstrates good understanding of home exercises and self management of symptoms       PT Education - May 23, 2015 0918    Education provided Yes   Education Details Instructed in continued home exercises to include scapular stabilizaiton/strengthening exercises and pain control   Person(s) Educated Patient   Methods Explanation   Comprehension Verbalized understanding             PT Long Term Goals - May 23, 2015 1216    PT LONG TERM GOAL #1   Title Patient will improve quicDASH to 50% or better demonstraing improvement with self perceived disability with ADL's by 03/30/15   Baseline Quick Dash 65% (quick Dash May 23, 2023 = 68% self perceived impairment: no significant change)   Status Not Met   PT LONG TERM GOAL #2   Title Patient will report decreased pain level to 3/10 max in right shoulder with reaching activities to allow her to perform hair care and reach into cabinets by 04/30/2015    Baseline Pain level 7/10 with aggravating activites still    Status Not Met   PT LONG TERM GOAL #3   Title Patient will improve quickDASH to 50% or better indicating improved self perceived disabilty with using right UE by 04/30/2015   Baseline 70% (68% currently at discharge)    Status Not Met   PT LONG TERM GOAL #4   Title Patient will be independent with home program for pain control and self management of symptoms for right UE without cuing by 04/30/2015   Baseline limited knowledge of appropriate pain control strategies and exercise progression   Status Achieved               Plan - May 23, 2015  0920    Clinical Impression Statement Patient has achieved independent self management of symptoms and home exercises. She continues with pain as primary limiting factor. She should continue to improve with independent self management. Goals have not been met due to continued intensity of pain in shoulder which is resolving slowly.    Pt will benefit from skilled therapeutic intervention in order to improve on the following deficits Decreased strength;Increased muscle spasms;Impaired flexibility;Impaired UE functional use;Pain;Decreased range of motion   Rehab Potential Good   Clinical Impairments Affecting Rehab Potential recurrent rotator cuff tear, previous surgery within the past 6 months   PT Frequency 2x / week   PT Duration 8 weeks   PT Treatment/Interventions Manual techniques;Cryotherapy;Therapeutic exercise;Electrical Stimulation;Scar mobilization;Passive range of motion;Moist Heat;Ultrasound          G-Codes - 2015-05-23 1214    Functional Assessment Tool Used QuickDASH, ROM, strenght deficits, clinical judgment, pain scale  Functional Limitation Carrying, moving and handling objects   Carrying, Moving and Handling Objects Goal Status [phone]) At least 20 percent but less than 40 percent impaired, limited or restricted   Carrying, Moving and Handling Objects Discharge Status [phone]) At least 40 percent but less than 60 percent impaired, limited or restricted      Problem List Patient Active Problem List   Diagnosis Date Noted  . HYPERLIPIDEMIA 06/21/2009  . HYPERTENSION 06/21/2009  . 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 FEVER UNSPECIFIED 06/21/2009  . NAUSEA 06/21/2009  . DYSPHAGIA 06/21/2009  . RUQ PAIN 06/21/2009  . HIATAL HERNIA 01/07/2008  . HYPOTHYROIDISM 11/22/2007  . ANXIETY 11/22/2007  . DEPRESSION 11/22/2007  . HYPERTENSIVE CARDIOVASCULAR DISEASE 11/22/2007  . 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 RENAL CALCULUS 11/22/2007  . DEGENERATIVE JOINT DISEASE 11/22/2007  .  SLEEP APNEA 11/22/2007  . HEADACHE, CHRONIC 11/22/2007  . GASTRITIS 10/23/2007  . DIVERTICULOSIS, COLON 10/23/2007  . DIABETES MELLITUS 07/16/2007  . HYPERCHOLESTEROLEMIA 07/16/2007  . OBESITY, MODERATE 07/16/2007  . ISCHEMIC HEART DISEASE 07/16/2007  . ESOPHAGEAL REFLUX 07/16/2007  . SCLERODERMA 07/16/2007  . DYSPNEA 07/16/2007  . COUGH, CHRONIC 07/16/2007    Jomarie Longs PT 04/27/2015, 12:35 PM  [place] [facility] [address], [place], [address] Phone: [phone]   Fax:  [phone]

## 2015-04-30 ENCOUNTER — Ambulatory Visit: Payer: PPO | Admitting: Physical Therapy

## 2015-05-04 ENCOUNTER — Encounter: Payer: PPO | Admitting: Physical Therapy

## 2015-05-07 ENCOUNTER — Encounter: Payer: PPO | Admitting: Physical Therapy

## 2015-05-11 ENCOUNTER — Encounter: Payer: PPO | Admitting: Physical Therapy

## 2015-06-28 DIAGNOSIS — M549 Dorsalgia, unspecified: Secondary | ICD-10-CM

## 2015-06-28 DIAGNOSIS — G8929 Other chronic pain: Secondary | ICD-10-CM | POA: Insufficient documentation

## 2015-06-28 DIAGNOSIS — J449 Chronic obstructive pulmonary disease, unspecified: Secondary | ICD-10-CM | POA: Insufficient documentation

## 2015-06-29 ENCOUNTER — Ambulatory Visit: Payer: Self-pay | Admitting: Family Medicine

## 2015-07-09 ENCOUNTER — Encounter: Payer: Self-pay | Admitting: Gastroenterology

## 2015-07-26 ENCOUNTER — Encounter: Payer: Self-pay | Admitting: Family Medicine

## 2015-07-26 ENCOUNTER — Ambulatory Visit (INDEPENDENT_AMBULATORY_CARE_PROVIDER_SITE_OTHER): Payer: PPO | Admitting: Family Medicine

## 2015-07-26 VITALS — BP 132/64 | HR 68 | Temp 98.6°F | Resp 16 | Ht 62.0 in | Wt 195.0 lb

## 2015-07-26 DIAGNOSIS — E039 Hypothyroidism, unspecified: Secondary | ICD-10-CM

## 2015-07-26 DIAGNOSIS — E119 Type 2 diabetes mellitus without complications: Secondary | ICD-10-CM

## 2015-07-26 DIAGNOSIS — W19XXXA Unspecified fall, initial encounter: Secondary | ICD-10-CM | POA: Diagnosis not present

## 2015-07-26 DIAGNOSIS — E785 Hyperlipidemia, unspecified: Secondary | ICD-10-CM | POA: Diagnosis not present

## 2015-07-26 DIAGNOSIS — I10 Essential (primary) hypertension: Secondary | ICD-10-CM | POA: Diagnosis not present

## 2015-07-26 DIAGNOSIS — R270 Ataxia, unspecified: Secondary | ICD-10-CM | POA: Diagnosis not present

## 2015-07-26 NOTE — Progress Notes (Signed)
Patient ID: Teresa [name], female   DOB: 05-Mar-1946, 69 y.o.   MRN: [id number]   Teresa [name]  MRN: [id number] DOB: [birth year]  Subjective:  HPI   1. Type 2 diabetes mellitus without complication, without long-term current use of insulin [facility]) The patient is a 69 year old female who presents for follow up diabetes.  Her last visit was on 05/21/14.  No management changes were made at that time.  Her last A1C was on 04/15/14 and was 7.9.  Patient checks her glucose BID.  Her readings have been in the range of about 196-300.  The patient states she has had 3 surgeries since her last visit with [place] and she has been on antibiotics and steroids on and off.  She is having difficulty getting her readings down.  She has had no hypoglycemic symptoms or events.  2. Essential hypertension The patient is also here to follow up on her hypertension.  She reports that when she checks her BP it is usually in the 140's over 80's.    3. Hypothyroidism, unspecified hypothyroidism type Patient is on thyroid medication and it is time for her TSH to be checked.   4. Hyperlipidemia Patient take cholesterol medications and has not had her level checked in over 1 year.  5. Fall, initial encounter The patient reports that she is having difficulty with falling.  She estimates 10-12 falls in the last year.  She had shoulder surgery 09/10/14.  She fell in the beginning of March requiring her to have repair done on the shoulder again.  She had that surgery in May 2016.  After that surgery she fell again in May and again had to have the shoulder repaired for the third time.  Patient states her most recent fall was last week and it was without injury.   Patient Active Problem List   Diagnosis Date Noted  . Back pain, chronic 06/28/2015  . Chronic airway obstruction ([place]) 06/28/2015  . HYPERLIPIDEMIA 06/21/2009  . HYPERTENSION 06/21/2009  . 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 FEVER UNSPECIFIED 06/21/2009  . NAUSEA  06/21/2009  . DYSPHAGIA 06/21/2009  . RUQ PAIN 06/21/2009  . HIATAL HERNIA 01/07/2008  . HYPOTHYROIDISM 11/22/2007  . ANXIETY 11/22/2007  . DEPRESSION 11/22/2007  . HYPERTENSIVE CARDIOVASCULAR DISEASE 11/22/2007  . 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 RENAL CALCULUS 11/22/2007  . DEGENERATIVE JOINT DISEASE 11/22/2007  . SLEEP APNEA 11/22/2007  . HEADACHE, CHRONIC 11/22/2007  . GASTRITIS 10/23/2007  . DIVERTICULOSIS, COLON 10/23/2007  . DIABETES MELLITUS 07/16/2007  . HYPERCHOLESTEROLEMIA 07/16/2007  . OBESITY, MODERATE 07/16/2007  . ISCHEMIC HEART DISEASE 07/16/2007  . ESOPHAGEAL REFLUX 07/16/2007  . SCLERODERMA 07/16/2007  . DYSPNEA 07/16/2007  . COUGH, CHRONIC 07/16/2007    No past medical history on file.  Social History   Social History  . Marital Status: Divorced    Spouse Name: N/A  . Number of Children: N/A  . Years of Education: N/A   Occupational History  . Not on file.   Social History Main Topics  . Smoking status: Never Smoker   . Smokeless tobacco: Not on file  . Alcohol Use: No  . Drug Use: No  . Sexual Activity: Not on file   Other Topics Concern  . Not on file   Social History Narrative    Outpatient Prescriptions Prior to Visit  Medication Sig Dispense Refill  . allopurinol (ZYLOPRIM) 100 MG tablet TAKE 1 TABLET EVERY DAY 90 tablet 3  . atorvastatin (LIPITOR) 10 MG  tablet Take 10 mg by mouth daily.    . colchicine 0.6 MG tablet Take 0.6 mg by mouth daily.    . furosemide (LASIX) 40 MG tablet TAKE 1 TABLET EVERY DAY 90 tablet 3  . gabapentin (NEURONTIN) 300 MG capsule Take 300 mg by mouth 3 (three) times daily.    Marland Kitchen glimepiride (AMARYL) 4 MG tablet TAKE 1 TABLET, ORAL, DAILY 30 tablet 11  . levothyroxine (SYNTHROID, LEVOTHROID) 88 MCG tablet TAKE 1 TABLET EVERY DAY 90 tablet 3  . losartan-hydrochlorothiazide (HYZAAR) 50-12.5 MG per tablet TAKE 1 TABLET EVERY DAY 90 tablet 3  . metFORMIN (GLUCOPHAGE) 1000 MG tablet TAKE 1 TABLET BY MOUTH TWICE A  DAY 180 tablet 3  . omeprazole (PRILOSEC) 20 MG capsule Take by mouth.    . ondansetron (ZOFRAN-ODT) 4 MG disintegrating tablet Take 4 mg by mouth every 8 (eight) hours as needed for nausea or vomiting.    . pantoprazole (PROTONIX) 20 MG tablet Take 20 mg by mouth daily.     No facility-administered medications prior to visit.   Outpatient Encounter Prescriptions as of 07/26/2015  Medication Sig Note  . allopurinol (ZYLOPRIM) 100 MG tablet TAKE 1 TABLET EVERY DAY   . atorvastatin (LIPITOR) 10 MG tablet Take 10 mg by mouth daily.   . colchicine 0.6 MG tablet Take 0.6 mg by mouth daily.   . furosemide (LASIX) 40 MG tablet TAKE 1 TABLET EVERY DAY   . gabapentin (NEURONTIN) 300 MG capsule Take 300 mg by mouth 3 (three) times daily.   Marland Kitchen glimepiride (AMARYL) 4 MG tablet TAKE 1 TABLET, ORAL, DAILY   . levothyroxine (SYNTHROID, LEVOTHROID) 88 MCG tablet TAKE 1 TABLET EVERY DAY   . losartan-hydrochlorothiazide (HYZAAR) 50-12.5 MG per tablet TAKE 1 TABLET EVERY DAY   . metFORMIN (GLUCOPHAGE) 1000 MG tablet TAKE 1 TABLET BY MOUTH TWICE A DAY   . omeprazole (PRILOSEC) 20 MG capsule Take by mouth. 06/28/2015: Received from: [facility]  . ondansetron (ZOFRAN-ODT) 4 MG disintegrating tablet Take 4 mg by mouth every 8 (eight) hours as needed for nausea or vomiting.   . [DISCONTINUED] pantoprazole (PROTONIX) 20 MG tablet Take 20 mg by mouth daily.    No facility-administered encounter medications on file as of 07/26/2015.    Allergies  Allergen Reactions  . Bacitracin-Neomycin-Polymyxin   . Clarithromycin Other (See Comments), Nausea Only and Nausea And Vomiting  . Codeine   . Dilaudid  [Hydromorphone Hcl] Nausea And Vomiting  . Hydromorphone Other (See Comments), Nausea Only and Nausea And Vomiting  . Iodine   . Iohexol      Desc: PT HAS HIVES/ITCHING   . Morphine   . Neomycin-Bacitracin Zn-Polymyx   . Oseltamivir Other (See Comments) and Nausea And Vomiting  . Tamiflu   [Oseltamivir Phosphate]     Other reaction(s): Abdominal Pain, Vomiting  . Zolpidem Other (See Comments)  . Zolpidem Tartrate   . Benzalkonium Chloride Itching, Rash and Swelling  . Lidocaine Hcl Itching, Rash and Swelling   Fall Risk  07/26/2015  Falls in the past year? Yes  Number falls in past yr: 2 or more  Injury with Fall? Yes  Risk Factor Category  (No Data)  Risk for fall due to : History of fall(s);Impaired balance/gait    Review of Systems  Constitutional: Positive for malaise/fatigue. Negative for fever and chills.  Eyes: Negative.   Respiratory: Negative for cough, hemoptysis, sputum production, shortness of breath and wheezing.   Cardiovascular: Positive for leg swelling. Negative  for chest pain, palpitations and orthopnea.  Gastrointestinal: Negative.   Musculoskeletal: Positive for falls (Patient states she has no cause for the falls, she does not trip or pass out.  ).  Neurological: Negative.   Endo/Heme/Allergies: Negative.  Negative for polydipsia.  Psychiatric/Behavioral: Negative.    Objective:  BP 132/64 mmHg  Pulse 68  Temp(Src) 98.6 F (37 C) (Oral)  Resp 16  Ht 5\' 2"  (1.575 m)  Wt 195 lb (88.451 kg)  BMI 35.66 kg/m2  Physical Exam  Constitutional: She is oriented to person, place, and time and well-developed, well-nourished, and in no distress.  HENT:  Head: Normocephalic and atraumatic.  Eyes: Conjunctivae and EOM are normal. Pupils are equal, round, and reactive to light.  Mild nystagmus to the left.  Neck: Normal range of motion.  Cardiovascular: Normal rate, regular rhythm and normal heart sounds.   Pulmonary/Chest: Effort normal and breath sounds normal.  Abdominal: Soft. Bowel sounds are normal.  Musculoskeletal: Normal range of motion.  Neurological: She is alert and oriented to person, place, and time. Gait normal.  Rhombergs-leans to the right.  Skin: Skin is warm and dry.  Psychiatric: Mood, memory, affect and judgment normal.     Assessment and Plan :   1. Type 2 diabetes mellitus without complication, without long-term current use of insulin (HCC)  - Hemoglobin A1c--7.1 today.  2. Essential hypertension  - CBC With Differential/Platelet - COMPLETE METABOLIC PANEL WITH GFR  3. Hypothyroidism, unspecified hypothyroidism type  - TSH  4. Hyperlipidemia  - Lipid Panel With LDL/HDL Ratio  5. Fall, initial encounter  - Ambulatory referral to Neurology  6. Ataxia  - Ambulatory referral to Neurology 7. Diabetic neuropathy I have done the exam and reviewed the above chart and it is accurate to the best of my knowledge.  I have done the exam and reviewed the above chart and it is accurate to the best of my knowledge.   Miguel Aschoff MD [facility] 07/26/2015 8:18 AM

## 2015-07-28 LAB — COMPREHENSIVE METABOLIC PANEL
A/G RATIO: 1.6 (ref 1.1–2.5)
ALK PHOS: 94 IU/L (ref 39–117)
ALT: 49 IU/L — AB (ref 0–32)
AST: 45 IU/L — AB (ref 0–40)
Albumin: 4.3 g/dL (ref 3.6–4.8)
BILIRUBIN TOTAL: 0.3 mg/dL (ref 0.0–1.2)
BUN/Creatinine Ratio: 20 (ref 11–26)
BUN: 15 mg/dL (ref 8–27)
CHLORIDE: 99 mmol/L (ref 96–106)
CO2: 26 mmol/L (ref 18–29)
Calcium: 9.4 mg/dL (ref 8.7–10.3)
Creatinine, Ser: 0.75 mg/dL (ref 0.57–1.00)
GFR calc non Af Amer: 82 mL/min/{1.73_m2} (ref >59)
GFR, EST AFRICAN AMERICAN: 94 mL/min/{1.73_m2} (ref >59)
GLUCOSE: 237 mg/dL — AB (ref 65–99)
Globulin, Total: 2.7 g/dL (ref 1.5–4.5)
POTASSIUM: 4 mmol/L (ref 3.5–5.2)
Sodium: 144 mmol/L (ref 134–144)
TOTAL PROTEIN: 7 g/dL (ref 6.0–8.5)

## 2015-07-28 LAB — TSH: TSH: 2.31 u[IU]/mL (ref 0.450–4.500)

## 2015-07-28 LAB — HEMOGLOBIN A1C
ESTIMATED AVERAGE GLUCOSE: 229 mg/dL
HEMOGLOBIN A1C: 9.6 % — AB (ref 4.8–5.6)

## 2015-07-28 LAB — LIPID PANEL WITH LDL/HDL RATIO
Cholesterol, Total: 142 mg/dL (ref 100–199)
HDL: 40 mg/dL (ref >39)
LDL CALC: 65 mg/dL (ref 0–99)
LDL/HDL RATIO: 1.6 ratio (ref 0.0–3.2)
TRIGLYCERIDES: 183 mg/dL — AB (ref 0–149)
VLDL Cholesterol Cal: 37 mg/dL (ref 5–40)

## 2015-07-29 LAB — CBC WITH DIFFERENTIAL/PLATELET
BASOS ABS: 0.1 10*3/uL (ref 0.0–0.2)
Basos: 1 %
EOS (ABSOLUTE): 0.4 10*3/uL (ref 0.0–0.4)
Eos: 5 %
Hematocrit: 43.6 % (ref 34.0–46.6)
Hemoglobin: 14.5 g/dL (ref 11.1–15.9)
Immature Grans (Abs): 0 10*3/uL (ref 0.0–0.1)
Immature Granulocytes: 0 %
LYMPHS ABS: 3.6 10*3/uL — AB (ref 0.7–3.1)
Lymphs: 43 %
MCH: 30.3 pg (ref 26.6–33.0)
MCHC: 33.3 g/dL (ref 31.5–35.7)
MCV: 91 fL (ref 79–97)
MONOS ABS: 0.5 10*3/uL (ref 0.1–0.9)
Monocytes: 7 %
NEUTROS ABS: 3.6 10*3/uL (ref 1.4–7.0)
NEUTROS PCT: 44 %
PLATELETS: 252 10*3/uL (ref 150–379)
RBC: 4.78 x10E6/uL (ref 3.77–5.28)
RDW: 14.6 % (ref 12.3–15.4)
WBC: 8.2 10*3/uL (ref 3.4–10.8)

## 2015-07-29 LAB — SPECIMEN STATUS REPORT

## 2015-08-03 ENCOUNTER — Other Ambulatory Visit: Payer: Self-pay

## 2015-08-03 ENCOUNTER — Telehealth: Payer: Self-pay

## 2015-08-03 MED ORDER — GLUCOSE BLOOD VI STRP: ORAL_STRIP | Status: DC

## 2015-08-03 NOTE — Telephone Encounter (Signed)
Can try Januvia, 100mg  one tablet daily, 30, rf x 1 follow up Dr. Rosanna Randy 6 weeks.

## 2015-08-03 NOTE — Telephone Encounter (Signed)
It was 9.6 on 07/26/15. thanks

## 2015-08-03 NOTE — Telephone Encounter (Signed)
Patient called and states that her last A1C was really high and she wants to see what changes she needs to make to help this. What medication needs to be added? She sees Dr/ Rosanna Randy and she was concerned with this. Please review-aa

## 2015-08-04 MED ORDER — GLUCOSE BLOOD VI STRP: ORAL_STRIP | Status: DC

## 2015-08-04 MED ORDER — SITAGLIPTIN PHOSPHATE 100 MG PO TABS: 100.0000 mg | ORAL_TABLET | Freq: Every day | ORAL | Status: DC

## 2015-08-04 NOTE — Telephone Encounter (Signed)
lmtcb-aa 

## 2015-08-04 NOTE — Telephone Encounter (Signed)
Pt is returning call.  [phone]/MW

## 2015-08-04 NOTE — Telephone Encounter (Signed)
Pt advised RX sent in, pt already has appt set up with Dr. Darnell Level will discuss everything then-aa

## 2015-08-10 ENCOUNTER — Telehealth: Payer: Self-pay | Admitting: Family Medicine

## 2015-08-10 NOTE — Telephone Encounter (Signed)
Pt called in saying [facility] [place] is faxing an a request for her Accuchek Compack Plus strips and lancets,  She said she completely out of strips.  Her call back is [phone].  ThanksTeri

## 2015-08-10 NOTE — Telephone Encounter (Signed)
Spoke with patient, we filled this RX on 12/28, patient states insurance is denying it and needs documentation from the doctor, she [facility] will look out for information about this. Patient states she has been using this meter and strips since 2007-she is use to it and it is the easiest for her to use do to tremor she has. Will await information from pharmacy-aa

## 2015-08-12 NOTE — Telephone Encounter (Signed)
Pt advised as below, patient states she spoke with insurance company and they told her that they need a medical necessity letter that patient needs this meter-she stats lancets are attached to her meter. She use to use a different pharmacy for this. She will call insurance and get them to get in touch with [place] because we dont have any information on where it needs to be sent to or what exactly the wording needs to be-aa

## 2015-08-12 NOTE — Telephone Encounter (Signed)
lmtcb-aa Spoke with pharmacist, the meter she uses now is not on her formularly and pharmacist did not even see where this has been filled there before unless she used a different pharmacy. He states One touch meter looks like will be covered and he said the meters are similar they just have different brand names. -aa

## 2015-08-13 ENCOUNTER — Other Ambulatory Visit: Payer: Self-pay | Admitting: Emergency Medicine

## 2015-08-13 DIAGNOSIS — E118 Type 2 diabetes mellitus with unspecified complications: Secondary | ICD-10-CM

## 2015-08-13 MED ORDER — ONETOUCH LANCETS MISC: 1.0000 | Freq: Every day | Status: DC

## 2015-08-13 MED ORDER — ONETOUCH ULTRA SYSTEM W/DEVICE KIT: 1.0000 | PACK | Freq: Every day | Status: DC

## 2015-08-17 NOTE — Telephone Encounter (Signed)
Pt called back saying the insurance company told her if we called them and told them why she needed this meter they would pay for it.  The number is [phone].  Thanks,  [employer]

## 2015-08-18 NOTE — Telephone Encounter (Signed)
Are you ok with proceeding with this to try and get this approved for her?-aa

## 2015-08-18 NOTE — Telephone Encounter (Signed)
And why does she need this specific meter?

## 2015-08-18 NOTE — Telephone Encounter (Signed)
Patient states she has been using this meter and strips since 2007-she is use to it and it is the easiest for her to use do to tremor she has. She states it comes with attached strips (from what i understood) and that helps her due to the tremor/shakiness in her hands. She is use to it and wants to keep it=aa

## 2015-08-18 NOTE — Telephone Encounter (Signed)
Ok thx.

## 2015-08-18 NOTE — Telephone Encounter (Signed)
Lmtcb, i tried calling the number below that patient provided but it is not in service. Need to verify the number we need to call-aa

## 2015-08-19 NOTE — Telephone Encounter (Signed)
Pt returned call. I verified insurance# is [phone]. Thanks TNP

## 2015-08-20 NOTE — Telephone Encounter (Signed)
Tried calling then got transferred to another department could not get a person to talk to, options on automatic service did not match what i needed. Pt was advised of this, pt advised to call insurance and get them to fax something to [place] about the information so we can fax it back. Pt understood and will get this done-aa

## 2015-08-24 ENCOUNTER — Telehealth: Payer: Self-pay | Admitting: Family Medicine

## 2015-08-24 NOTE — Telephone Encounter (Signed)
They need order for accu-chek strips Compact Plus  They need PR.  Call back is  [phone]  Thanks Con Memos

## 2015-08-24 NOTE — Telephone Encounter (Signed)
lmtcb-aa For pharmacist in regards to the message they sent-aa

## 2015-08-25 NOTE — Telephone Encounter (Signed)
Patient called back and I let her know that we were still trying to get her test strips approved.

## 2015-09-02 ENCOUNTER — Other Ambulatory Visit: Payer: Self-pay

## 2015-09-02 MED ORDER — GLUCOSE BLOOD VI STRP: ORAL_STRIP | Status: DC

## 2015-09-03 ENCOUNTER — Other Ambulatory Visit: Payer: Self-pay

## 2015-09-28 ENCOUNTER — Telehealth: Payer: Self-pay | Admitting: Family Medicine

## 2015-09-28 NOTE — Telephone Encounter (Signed)
Does she need to do anything different about her medication? Sugar still in 200s.-aa

## 2015-09-28 NOTE — Telephone Encounter (Signed)
Pt states Dr Caryn Section advised her to start on a new Rx, sitaGLIPtin (JANUVIA) 100 MG tablet in December.  Pt states her blood sugar is still in the 200's.  Pt is asking if she should be taking something different.  [facility].  [id number]

## 2015-09-28 NOTE — Telephone Encounter (Signed)
Please review-aa 

## 2015-09-28 NOTE — Telephone Encounter (Signed)
Please advise 

## 2015-09-28 NOTE — Telephone Encounter (Signed)
Last A1c was okay.Move up Next visit to  late March.

## 2015-09-28 NOTE — Telephone Encounter (Signed)
When was his last visit?

## 2015-09-29 NOTE — Telephone Encounter (Signed)
She make an appointment for tomorrow for next week and bring in her sugars and decide on that adjustment.

## 2015-09-29 NOTE — Telephone Encounter (Signed)
Patient advised and appointment made-aa 

## 2015-09-30 ENCOUNTER — Ambulatory Visit (INDEPENDENT_AMBULATORY_CARE_PROVIDER_SITE_OTHER): Payer: PPO | Admitting: Family Medicine

## 2015-09-30 VITALS — BP 122/60 | HR 76 | Temp 98.0°F | Resp 16 | Wt 191.0 lb

## 2015-09-30 DIAGNOSIS — E119 Type 2 diabetes mellitus without complications: Secondary | ICD-10-CM | POA: Diagnosis not present

## 2015-09-30 MED ORDER — GLIMEPIRIDE 4 MG PO TABS: 4.0000 mg | ORAL_TABLET | Freq: Every day | ORAL | Status: DC

## 2015-09-30 MED ORDER — EMPAGLIFLOZIN 25 MG PO TABS: 25.0000 mg | ORAL_TABLET | Freq: Every day | ORAL | Status: DC

## 2015-09-30 NOTE — Progress Notes (Signed)
Patient ID: Teresa [name], female   DOB: 02-13-1946, 70 y.o.   MRN: [id number]   Teresa [name]  MRN: [id number] DOB: 1946-05-15  Subjective:  HPI   1. Type 2 diabetes mellitus without complication, without long-term current use of insulin [facility]) The patient is a 70 year old female who presents today because of elevated glucose.  She is diabetic and her last visit on 07/26/15 she had an A1C of 9.6.  She had Januvia added to her regiment at that time.  She reports that she does not see any improvement in her glucose readings and they are actually even worse than they were prior to the December visit.  Patient Active Problem List   Diagnosis Date Noted  . Back pain, chronic 06/28/2015  . Chronic airway obstruction ([place]) 06/28/2015  . HYPERLIPIDEMIA 06/21/2009  . HYPERTENSION 06/21/2009  . 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 FEVER UNSPECIFIED 06/21/2009  . NAUSEA 06/21/2009  . DYSPHAGIA 06/21/2009  . RUQ PAIN 06/21/2009  . HIATAL HERNIA 01/07/2008  . HYPOTHYROIDISM 11/22/2007  . ANXIETY 11/22/2007  . DEPRESSION 11/22/2007  . HYPERTENSIVE CARDIOVASCULAR DISEASE 11/22/2007  . 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 RENAL CALCULUS 11/22/2007  . DEGENERATIVE JOINT DISEASE 11/22/2007  . SLEEP APNEA 11/22/2007  . HEADACHE, CHRONIC 11/22/2007  . GASTRITIS 10/23/2007  . DIVERTICULOSIS, COLON 10/23/2007  . Diabetes ([place]) 07/16/2007  . HYPERCHOLESTEROLEMIA 07/16/2007  . OBESITY, MODERATE 07/16/2007  . ISCHEMIC HEART DISEASE 07/16/2007  . ESOPHAGEAL REFLUX 07/16/2007  . SCLERODERMA 07/16/2007  . DYSPNEA 07/16/2007  . COUGH, CHRONIC 07/16/2007    No past medical history on file.  Social History   Social History  . Marital Status: Divorced    Spouse Name: N/A  . Number of Children: N/A  . Years of Education: N/A   Occupational History  . Not on file.   Social History Main Topics  . Smoking status: Never Smoker   . Smokeless tobacco: Not on file  . Alcohol Use: No  . Drug Use: No    . Sexual Activity: Not on file   Other Topics Concern  . Not on file   Social History Narrative    Outpatient Prescriptions Prior to Visit  Medication Sig Dispense Refill  . allopurinol (ZYLOPRIM) 100 MG tablet TAKE 1 TABLET EVERY DAY 90 tablet 3  . atorvastatin (LIPITOR) 10 MG tablet Take 10 mg by mouth daily.    . colchicine 0.6 MG tablet Take 0.6 mg by mouth daily.    . furosemide (LASIX) 40 MG tablet TAKE 1 TABLET EVERY DAY 90 tablet 3  . gabapentin (NEURONTIN) 300 MG capsule Take 300 mg by mouth 3 (three) times daily.    Marland Kitchen glimepiride (AMARYL) 4 MG tablet TAKE 1 TABLET, ORAL, DAILY 30 tablet 11  . glucose blood (ACCU-CHEK COMPACT PLUS) test strip Check sugar twice daily DX: E11.9--NEED 90 DAY SUPPLY 100 each 12  . levothyroxine (SYNTHROID, LEVOTHROID) 88 MCG tablet TAKE 1 TABLET EVERY DAY 90 tablet 3  . losartan-hydrochlorothiazide (HYZAAR) 50-12.5 MG per tablet TAKE 1 TABLET EVERY DAY 90 tablet 3  . metFORMIN (GLUCOPHAGE) 1000 MG tablet TAKE 1 TABLET BY MOUTH TWICE A DAY 180 tablet 3  . omeprazole (PRILOSEC) 20 MG capsule Take by mouth.    . ondansetron (ZOFRAN-ODT) 4 MG disintegrating tablet Take 4 mg by mouth every 8 (eight) hours as needed for nausea or vomiting.    . ONE TOUCH LANCETS MISC 1 each by Does not apply route daily. 30 each 12  .  sitaGLIPtin (JANUVIA) 100 MG tablet Take 1 tablet (100 mg total) by mouth daily. 30 tablet 1   No facility-administered medications prior to visit.    Allergies  Allergen Reactions  . Bacitracin-Neomycin-Polymyxin   . Clarithromycin Other (See Comments), Nausea Only and Nausea And Vomiting  . Codeine   . Dilaudid  [Hydromorphone Hcl] Nausea And Vomiting  . Hydromorphone Other (See Comments), Nausea Only and Nausea And Vomiting  . Iodine   . Iohexol      Desc: PT HAS HIVES/ITCHING   . Morphine   . Neomycin-Bacitracin Zn-Polymyx   . Oseltamivir Other (See Comments) and Nausea And Vomiting  . Tamiflu  [Oseltamivir Phosphate]      Other reaction(s): Abdominal Pain, Vomiting  . Zolpidem Other (See Comments)  . Zolpidem Tartrate   . Benzalkonium Chloride Itching, Rash and Swelling  . Lidocaine Hcl Itching, Rash and Swelling    Review of Systems  Constitutional: Positive for malaise/fatigue. Negative for fever and chills.  Respiratory: Negative for cough, hemoptysis, sputum production, shortness of breath and wheezing.   Cardiovascular: Positive for leg swelling. Negative for chest pain, palpitations and orthopnea.  Neurological: Positive for dizziness and headaches. Negative for weakness.  Endo/Heme/Allergies: Negative for polydipsia.   Objective:  BP 122/60 mmHg  Pulse 76  Temp(Src) 98 F (36.7 C) (Oral)  Resp 16  Wt 191 lb (86.637 kg)  Physical Exam  Constitutional: She is oriented to person, place, and time and well-developed, well-nourished, and in no distress.  HENT:  Head: Normocephalic and atraumatic.  Right Ear: External ear normal.  Left Ear: External ear normal.  Nose: Nose normal.  Eyes: Conjunctivae are normal.  Neck: Normal range of motion. Neck supple. No thyromegaly present.  Cardiovascular: Normal rate, regular rhythm and normal heart sounds.   Pulmonary/Chest: Effort normal and breath sounds normal.  Abdominal: Soft.  Lymphadenopathy:    She has no cervical adenopathy.  Neurological: She is alert and oriented to person, place, and time.  Skin: Skin is warm and dry.  Psychiatric: Mood, memory, affect and judgment normal.    Assessment and Plan :   1. Type 2 diabetes mellitus without complication, without long-term current use of insulin ([place]) Patient is with uncontrolled diabetes.  Will discontinue the Januvia and start her on Jardiance,  titrate from 10 mg up to 25 mg and see the patient back in 1 month. Insulin will be  next step if this fails. - glimepiride (AMARYL) 4 MG tablet; Take 1 tablet (4 mg total) by mouth daily with breakfast.  Dispense: 30 tablet; Refill: 11 -  empagliflozin (JARDIANCE) 25 MG TABS tablet; Take 25 mg by mouth daily.  Dispense: 30 tablet; Refill: [facility] 09/30/2015 9:12 AM

## 2015-10-18 ENCOUNTER — Other Ambulatory Visit: Payer: Self-pay | Admitting: Family Medicine

## 2015-10-26 ENCOUNTER — Ambulatory Visit (INDEPENDENT_AMBULATORY_CARE_PROVIDER_SITE_OTHER): Payer: PPO | Admitting: Family Medicine

## 2015-10-26 VITALS — BP 130/62 | HR 74 | Temp 97.9°F | Resp 18 | Wt 192.0 lb

## 2015-10-26 DIAGNOSIS — E119 Type 2 diabetes mellitus without complications: Secondary | ICD-10-CM

## 2015-10-26 LAB — POCT GLYCOSYLATED HEMOGLOBIN (HGB A1C): Hemoglobin A1C: 8.8

## 2015-10-26 MED ORDER — EMPAGLIFLOZIN 25 MG PO TABS: 25.0000 mg | ORAL_TABLET | Freq: Every day | ORAL | Status: DC

## 2015-10-26 MED ORDER — GLIMEPIRIDE 4 MG PO TABS: 4.0000 mg | ORAL_TABLET | Freq: Two times a day (BID) | ORAL | Status: DC

## 2015-10-26 NOTE — Progress Notes (Signed)
Patient ID: Teresa [name], female   DOB: [name], 70 y.o.   MRN: [id number]   Teresa [name]  MRN: [id number] DOB: Teresa [name]  Subjective:  HPI   1. Type 2 diabetes mellitus without complication, without long-term current use of insulin [facility]) The patient is a 70 year old female who presents today for follow up of her diabetes.  She was last seen on 09/30/15 and started on Jardiance.  The patient states she does not see any improvement with her glucose.  She checks her glucose twice daily and states that they have been running 200-225.  Her last A1C was on 07/26/15 and it was 9.6.  Patient Active Problem List   Diagnosis Date Noted  . Back pain, chronic 06/28/2015  . Chronic airway obstruction ([place]) 06/28/2015  . 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 HYPERLIPIDEMIA 06/21/2009  . HYPERTENSION 06/21/2009  . 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 FEVER UNSPECIFIED 06/21/2009  . NAUSEA 06/21/2009  . DYSPHAGIA 06/21/2009  . RUQ PAIN 06/21/2009  . HIATAL HERNIA 01/07/2008  . HYPOTHYROIDISM 11/22/2007  . ANXIETY 11/22/2007  . DEPRESSION 11/22/2007  . HYPERTENSIVE CARDIOVASCULAR DISEASE 11/22/2007  . 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 RENAL CALCULUS 11/22/2007  . DEGENERATIVE JOINT DISEASE 11/22/2007  . SLEEP APNEA 11/22/2007  . HEADACHE, CHRONIC 11/22/2007  . GASTRITIS 10/23/2007  . DIVERTICULOSIS, COLON 10/23/2007  . Diabetes ([place]) 07/16/2007  . HYPERCHOLESTEROLEMIA 07/16/2007  . OBESITY, MODERATE 07/16/2007  . ISCHEMIC HEART DISEASE 07/16/2007  . ESOPHAGEAL REFLUX 07/16/2007  . SCLERODERMA 07/16/2007  . DYSPNEA 07/16/2007  . COUGH, CHRONIC 07/16/2007    No past medical history on file.  Social History   Social History  . Marital Status: Divorced    Spouse Name: N/A  . Number of Children: N/A  . Years of Education: N/A   Occupational History  . Not on file.   Social History Main Topics  . Smoking status: Never Smoker   . Smokeless tobacco: Not on file  . Alcohol  Use: No  . Drug Use: No  . Sexual Activity: Not on file   Other Topics Concern  . Not on file   Social History Narrative    Outpatient Prescriptions Prior to Visit  Medication Sig Dispense Refill  . allopurinol (ZYLOPRIM) 100 MG tablet TAKE 1 TABLET EVERY DAY 90 tablet 3  . atorvastatin (LIPITOR) 10 MG tablet TAKE 1 TABLET BY MOUTH EVERY DAY AT BEDTIME 90 tablet 1  . colchicine 0.6 MG tablet Take 0.6 mg by mouth daily.    . empagliflozin (JARDIANCE) 25 MG TABS tablet Take 25 mg by mouth daily. 30 tablet 12  . furosemide (LASIX) 40 MG tablet TAKE 1 TABLET EVERY DAY 90 tablet 3  . gabapentin (NEURONTIN) 300 MG capsule Take 300 mg by mouth 3 (three) times daily.    Marland Kitchen glimepiride (AMARYL) 4 MG tablet Take 1 tablet (4 mg total) by mouth daily with breakfast. 30 tablet 11  . glucose blood (ACCU-CHEK COMPACT PLUS) test strip Check sugar twice daily DX: E11.9--NEED 90 DAY SUPPLY 100 each 12  . levothyroxine (SYNTHROID, LEVOTHROID) 88 MCG tablet TAKE 1 TABLET EVERY DAY 90 tablet 3  . losartan-hydrochlorothiazide (HYZAAR) 50-12.5 MG per tablet TAKE 1 TABLET EVERY DAY 90 tablet 3  . metFORMIN (GLUCOPHAGE) 1000 MG tablet TAKE 1 TABLET BY MOUTH TWICE A DAY 180 tablet 3  . omeprazole (PRILOSEC) 20 MG capsule Take by mouth.    . ondansetron (ZOFRAN-ODT) 4 MG disintegrating tablet Take 4 mg by mouth every  8 (eight) hours as needed for nausea or vomiting.    . ONE TOUCH LANCETS MISC 1 each by Does not apply route daily. 30 each 12   No facility-administered medications prior to visit.    Allergies  Allergen Reactions  . Bacitracin-Neomycin-Polymyxin   . Clarithromycin Other (See Comments), Nausea Only and Nausea And Vomiting  . Codeine   . Dilaudid  [Hydromorphone Hcl] Nausea And Vomiting  . Hydromorphone Other (See Comments), Nausea Only and Nausea And Vomiting  . Iodine   . Iohexol      Desc: PT HAS HIVES/ITCHING   . Morphine   . Neomycin-Bacitracin Zn-Polymyx   . Oseltamivir Other (See  Comments) and Nausea And Vomiting  . Tamiflu  [Oseltamivir Phosphate]     Other reaction(s): Abdominal Pain, Vomiting  . Zolpidem Other (See Comments)  . Zolpidem Tartrate   . Benzalkonium Chloride Itching, Rash and Swelling  . Lidocaine Hcl Itching, Rash and Swelling    Review of Systems  Constitutional: Positive for malaise/fatigue.  Respiratory: Negative for cough, shortness of breath and wheezing.   Cardiovascular: Negative for chest pain, palpitations, orthopnea and leg swelling.  Neurological: Positive for dizziness, weakness and headaches.   Objective:  BP 130/62 mmHg  Pulse 74  Temp(Src) 97.9 F (36.6 C) (Oral)  Resp 18  Wt 192 lb (87.091 kg)  Physical Exam  Constitutional: She is oriented to person, place, and time and well-developed, well-nourished, and in no distress.  HENT:  Head: Normocephalic and atraumatic.  Right Ear: External ear normal.  Left Ear: External ear normal.  Nose: Nose normal.  Eyes: Pupils are equal, round, and reactive to light.  Neck: Normal range of motion.  Cardiovascular: Normal rate, regular rhythm and normal heart sounds.   Pulmonary/Chest: Effort normal and breath sounds normal.  Abdominal: Soft.  Neurological: She is alert and oriented to person, place, and time. Gait normal.  Skin: Skin is warm and dry.  Psychiatric: Mood, memory, affect and judgment normal.    Assessment and Plan :   1. Type 2 diabetes mellitus without complication, without long-term current use of insulin (HCC)  - POCT glycosylated hemoglobin (Hb A1C)--8.8 today - glimepiride (AMARYL) 4 MG tablet; Take 1 tablet (4 mg total) by mouth 2 (two) times daily.  Dispense: 60 tablet; Refill: 11 - empagliflozin (JARDIANCE) 25 MG TABS tablet; Take 25 mg by mouth daily.  Dispense: 30 tablet; Refill: 12  I have done the exam and reviewed the above chart and it is accurate to the best of my knowledge.  Miguel Aschoff MD [facility] 10/26/2015 10:12 AM

## 2015-11-01 ENCOUNTER — Other Ambulatory Visit: Payer: Self-pay | Admitting: Family Medicine

## 2015-11-01 MED ORDER — GABAPENTIN 400 MG PO CAPS: 400.0000 mg | ORAL_CAPSULE | Freq: Three times a day (TID) | ORAL | Status: DC

## 2015-11-01 NOTE — Telephone Encounter (Signed)
1year rf. 

## 2015-11-01 NOTE — Telephone Encounter (Signed)
Ok to refill? Please advise. Thanks!  

## 2015-11-01 NOTE — Telephone Encounter (Signed)
Pt contacted office for refill request on the following medications:  gabapentin (NEURONTIN) 300 MG capsule.  Pt states this should be 400MG .  [facility].  [id number]

## 2015-11-01 NOTE — Telephone Encounter (Signed)
RX refilled-aa 

## 2015-11-04 ENCOUNTER — Telehealth: Payer: Self-pay | Admitting: Family Medicine

## 2015-11-04 NOTE — Telephone Encounter (Signed)
Pt states she is having burning and itching around the vaginal area that started Tuesday night.  Pt is request a Rx to help with this.  [facility].  [id number]

## 2015-11-04 NOTE — Telephone Encounter (Signed)
Patient is going to try OTC medication and if that does not help she will be seen.  ED

## 2015-11-10 ENCOUNTER — Telehealth: Payer: Self-pay

## 2015-11-10 DIAGNOSIS — Z1239 Encounter for other screening for malignant neoplasm of breast: Secondary | ICD-10-CM

## 2015-11-10 NOTE — Telephone Encounter (Signed)
Order put in for [facility]

## 2015-11-22 ENCOUNTER — Ambulatory Visit: Payer: PPO | Admitting: Family Medicine

## 2015-11-23 ENCOUNTER — Encounter: Payer: PPO | Admitting: Physician Assistant

## 2015-11-26 ENCOUNTER — Ambulatory Visit
Admission: RE | Admit: 2015-11-26 | Discharge: 2015-11-26 | Disposition: A | Discharge status: Home / Self Care | Payer: PPO | Source: Ambulatory Visit | Attending: Family Medicine | Admitting: Family Medicine

## 2015-11-26 DIAGNOSIS — Z1231 Encounter for screening mammogram for malignant neoplasm of breast: Secondary | ICD-10-CM | POA: Diagnosis not present

## 2015-11-26 DIAGNOSIS — R928 Other abnormal and inconclusive findings on diagnostic imaging of breast: Secondary | ICD-10-CM | POA: Insufficient documentation

## 2015-11-26 DIAGNOSIS — Z1239 Encounter for other screening for malignant neoplasm of breast: Secondary | ICD-10-CM

## 2015-11-29 ENCOUNTER — Other Ambulatory Visit: Payer: Self-pay | Admitting: Family Medicine

## 2015-11-29 DIAGNOSIS — R928 Other abnormal and inconclusive findings on diagnostic imaging of breast: Secondary | ICD-10-CM

## 2015-12-07 ENCOUNTER — Telehealth: Payer: Self-pay | Admitting: Family Medicine

## 2015-12-07 NOTE — Telephone Encounter (Signed)
Please review-aa 

## 2015-12-07 NOTE — Telephone Encounter (Signed)
Pt is requesting a letter written to see if she can move to a downstairs apartment due to knee pain.  Pt states she has 13 steps to climb to get to her apartment.  Pt states she will have to pay extra to move without this letter. CB#[phone]/MW

## 2015-12-08 ENCOUNTER — Encounter: Payer: Self-pay | Admitting: Physician Assistant

## 2015-12-08 ENCOUNTER — Ambulatory Visit (INDEPENDENT_AMBULATORY_CARE_PROVIDER_SITE_OTHER): Payer: PPO | Admitting: Physician Assistant

## 2015-12-08 VITALS — BP 138/60 | HR 72 | Temp 98.2°F | Resp 16 | Ht 62.0 in | Wt 192.2 lb

## 2015-12-08 DIAGNOSIS — E118 Type 2 diabetes mellitus with unspecified complications: Secondary | ICD-10-CM | POA: Diagnosis not present

## 2015-12-08 DIAGNOSIS — R300 Dysuria: Secondary | ICD-10-CM

## 2015-12-08 DIAGNOSIS — Z0000 Encounter for general adult medical examination without abnormal findings: Secondary | ICD-10-CM

## 2015-12-08 DIAGNOSIS — Z1159 Encounter for screening for other viral diseases: Secondary | ICD-10-CM

## 2015-12-08 DIAGNOSIS — Z23 Encounter for immunization: Secondary | ICD-10-CM | POA: Diagnosis not present

## 2015-12-08 LAB — POCT URINALYSIS DIPSTICK
BILIRUBIN UA: NEGATIVE
GLUCOSE UA: 2000
KETONES UA: 5
Leukocytes, UA: NEGATIVE
Nitrite, UA: NEGATIVE
Protein, UA: NEGATIVE
RBC UA: NEGATIVE
SPEC GRAV UA: 1.01
Urobilinogen, UA: 0.2
pH, UA: 5

## 2015-12-08 LAB — POCT UA - MICROALBUMIN: Microalbumin Ur, POC: 20 mg/L

## 2015-12-08 NOTE — Patient Instructions (Signed)

## 2015-12-08 NOTE — Progress Notes (Signed)
Patient: Teresa [name], Female    DOB: 12-Oct-1945, 70 y.o.   MRN: [id number] Visit Date: 12/08/2015  Today's Provider: Mar Daring, PA-C   Chief Complaint  Patient presents with  . Medicare Wellness   Subjective:    Annual wellness visit Teresa [name] is a 70 y.o. female. She feels fairly well. She reports exercising - when she can she walks 30-45 minutes. She reports she is sleeping poorly.  Mammogram:11/26/2015 BI-RADS 0: Incomplete. Need additional imaging evaluation and/or prior mammogram for comparison. She is going this Friday, 12/10/15, for a repeat mammogram on the right breast. Hemoglobin A1C: 10/26/2015 8.8 with Dr. Rosanna Randy Pap: N/A s/p hysterectomy -----------------------------------------------------------   Review of Systems  HENT: Negative.   Eyes: Negative.   Respiratory: Positive for shortness of breath. Negative for cough, chest tightness and wheezing.   Cardiovascular: Negative.   Gastrointestinal: Negative.   Endocrine: Negative.   Genitourinary: Positive for dysuria. Negative for frequency, hematuria, flank pain, vaginal bleeding, vaginal discharge, genital sores and pelvic pain.  Musculoskeletal: Positive for myalgias, back pain, arthralgias and neck pain.  Skin: Negative.   Allergic/Immunologic: Positive for environmental allergies and food allergies (onions).  Neurological: Positive for dizziness and weakness.  Hematological: Negative.   Psychiatric/Behavioral: Negative.     Social History   Social History  . Marital Status: Divorced    Spouse Name: N/A  . Number of Children: N/A  . Years of Education: N/A   Occupational History  . Not on file.   Social History Main Topics  . Smoking status: Never Smoker   . Smokeless tobacco: Not on file  . Alcohol Use: No  . Drug Use: No  . Sexual Activity: Not on file   Other Topics Concern  . Not on file   Social History Narrative    History reviewed. No pertinent past medical  history.   Patient Active Problem List   Diagnosis Date Noted  . Back pain, chronic 06/28/2015  . Chronic airway obstruction ([place]) 06/28/2015  . 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 HYPERLIPIDEMIA 06/21/2009  . HYPERTENSION 06/21/2009  . 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 FEVER UNSPECIFIED 06/21/2009  . NAUSEA 06/21/2009  . DYSPHAGIA 06/21/2009  . RUQ PAIN 06/21/2009  . HIATAL HERNIA 01/07/2008  . HYPOTHYROIDISM 11/22/2007  . ANXIETY 11/22/2007  . DEPRESSION 11/22/2007  . HYPERTENSIVE CARDIOVASCULAR DISEASE 11/22/2007  . 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 RENAL CALCULUS 11/22/2007  . DEGENERATIVE JOINT DISEASE 11/22/2007  . SLEEP APNEA 11/22/2007  . HEADACHE, CHRONIC 11/22/2007  . GASTRITIS 10/23/2007  . DIVERTICULOSIS, COLON 10/23/2007  . Diabetes ([place]) 07/16/2007  . HYPERCHOLESTEROLEMIA 07/16/2007  . OBESITY, MODERATE 07/16/2007  . ISCHEMIC HEART DISEASE 07/16/2007  . ESOPHAGEAL REFLUX 07/16/2007  . SCLERODERMA 07/16/2007  . DYSPNEA 07/16/2007  . COUGH, CHRONIC 07/16/2007    Past Surgical History  Procedure Laterality Date  . Cardiac catheterization    . Fracture surgery      left ankle-plate and screws palced  . Nissen fundoplication    . Carpal tunnel release    . Dilation and curettage of uterus    . Abdominal hysterectomy      ovaries intact  . Tonsillectomy    . Shoulder surgery Right   . Lithotripsy    . Eye surgery      eyelid  . Bladder surgery      bladder tuck    Her family history includes Arthritis in her mother; Asthma in her sister; Breast cancer  in her maternal aunt and maternal grandmother; Colon cancer in her maternal grandmother; Diabetes in her sister; Diverticulitis in her sister; Heart disease in her father and mother; Hypertension in her father, mother, sister, and sister; Stroke in her mother.    Previous Medications   ALLOPURINOL (ZYLOPRIM) 100 MG TABLET    TAKE 1 TABLET EVERY DAY   ATORVASTATIN (LIPITOR) 10 MG TABLET    TAKE 1  TABLET BY MOUTH EVERY DAY AT BEDTIME   COLCHICINE 0.6 MG TABLET    Take 0.6 mg by mouth daily.   EMPAGLIFLOZIN (JARDIANCE) 25 MG TABS TABLET    Take 25 mg by mouth daily.   FUROSEMIDE (LASIX) 40 MG TABLET    TAKE 1 TABLET EVERY DAY   GABAPENTIN (NEURONTIN) 400 MG CAPSULE    Take 1 capsule (400 mg total) by mouth 3 (three) times daily.   GLIMEPIRIDE (AMARYL) 4 MG TABLET    Take 1 tablet (4 mg total) by mouth 2 (two) times daily.   GLUCOSE BLOOD (ACCU-CHEK COMPACT PLUS) TEST STRIP    Check sugar twice daily DX: E11.9--NEED 90 DAY SUPPLY   LEVOTHYROXINE (SYNTHROID, LEVOTHROID) 88 MCG TABLET    TAKE 1 TABLET EVERY DAY   LOSARTAN-HYDROCHLOROTHIAZIDE (HYZAAR) 50-12.5 MG PER TABLET    TAKE 1 TABLET EVERY DAY   METFORMIN (GLUCOPHAGE) 1000 MG TABLET    TAKE 1 TABLET BY MOUTH TWICE A DAY   OMEPRAZOLE (PRILOSEC) 20 MG CAPSULE    Take by mouth.   ONDANSETRON (ZOFRAN-ODT) 4 MG DISINTEGRATING TABLET    Take 4 mg by mouth every 8 (eight) hours as needed for nausea or vomiting.   ONE TOUCH LANCETS MISC    1 each by Does not apply route daily.    Patient Care Team: [name], MD as PCP - General (Family Medicine)     Objective:   Vitals: BP 138/60 mmHg  Pulse 72  Temp(Src) 98.2 F (36.8 C) (Oral)  Resp 16  Ht 5\' 2"  (1.575 m)  Wt 192 lb 3.2 oz (87.181 kg)  BMI 35.14 kg/m2  Physical Exam  Constitutional: She is oriented to person, place, and time. She appears well-developed and well-nourished. No distress.  HENT:  Head: Normocephalic and atraumatic.  Right Ear: External ear normal.  Left Ear: External ear normal.  Nose: Nose normal.  Mouth/Throat: Oropharynx is clear and moist. No oropharyngeal exudate.  Eyes: Conjunctivae and EOM are normal. Pupils are equal, round, and reactive to light. Right eye exhibits no discharge. Left eye exhibits no discharge. No scleral icterus.  Neck: Normal range of motion. Neck supple. No JVD present. No tracheal deviation present. No thyromegaly  present.  Cardiovascular: Normal rate, regular rhythm, normal heart sounds and intact distal pulses.  Exam reveals no gallop and no friction rub.   No murmur heard. Pulmonary/Chest: Effort normal and breath sounds normal. No respiratory distress. She has no wheezes. She has no rales. She exhibits no tenderness.  Abdominal: Soft. Bowel sounds are normal. She exhibits no distension and no mass. There is no tenderness. There is no rebound and no guarding.  Musculoskeletal: Normal range of motion. She exhibits no edema or tenderness.  Lymphadenopathy:    She has no cervical adenopathy.  Neurological: She is alert and oriented to person, place, and time.  Skin: Skin is warm and dry. No rash noted. She is not diaphoretic.  Psychiatric: She has a normal mood and affect. Her behavior is normal. Judgment and thought content normal.  Vitals reviewed.  Activities of Daily Living In your present state of health, do you have any difficulty performing the following activities: 12/08/2015  Hearing? N  Vision? Y  Difficulty concentrating or making decisions? Y  Walking or climbing stairs? Y  Dressing or bathing? N  Doing errands, shopping? N    Fall Risk Assessment Fall Risk  12/08/2015 07/26/2015  Falls in the past year? Yes Yes  Number falls in past yr: 2 or more 2 or more  Injury with Fall? No Yes  Risk Factor Category  - (No Data)  Risk for fall due to : Impaired balance/gait History of fall(s);Impaired balance/gait    .Audit-C Alcohol Use Screening  Question Answer Points  How often do you have alcoholic drink? never 0  On days you do drink alcohol, how many drinks do you typically consume? 0 0  How oftey will you drink 6 or more in a total? never 0  Total Score:  0   A score of 3 or more in women, and 4 or more in men indicates increased risk for alcohol abuse, EXCEPT if all of the points are from question 1.  Depression Screen PHQ 2/9 Scores 12/08/2015  PHQ - 2 Score 2  PHQ- 9 Score 5      Cognitive Testing - 6-CIT  Correct? Score   What year is it? yes 0 0 or 4  What month is it? yes 0 0 or 3  Memorize:    Pia Mau,  42,  [address],  [place],      What time is it? (within 1 hour) yes 0 0 or 3  Count backwards from 20 yes 0 0, 2, or 4  Name the months of the year yes 0 0, 2, or 4  Repeat name & address above no 4 0, 2, 4, 6, 8, or 10       TOTAL SCORE  4/28   Interpretation:  Normal  Normal (0-7) Abnormal (8-28)    Visual Acuity Screening   Right eye Left eye Both eyes  Without correction:     With correction: 20/25 20/25 20/25       Assessment & Plan:     Annual Wellness Visit  Reviewed patient's Family Medical History Reviewed and updated list of patient's medical providers Assessment of cognitive impairment was done Assessed patient's functional ability Established a written schedule for health screening [place] Completed and Reviewed  Exercise Activities and Dietary recommendations Goals    None      Immunization History  Administered Date(s) Administered  . Pneumococcal Conjugate-13 05/21/2014  . Pneumococcal Polysaccharide-23 07/11/2010  . Tdap 05/11/2011  . Zoster 05/07/2008    Health Maintenance  Topic Date Due  . Hepatitis C Screening  01/16/1946  . FOOT EXAM  09/04/1955  . OPHTHALMOLOGY EXAM  09/04/1955  . PNA vac Low Risk Adult (2 of 2 - PPSV23) 07/12/2015  . INFLUENZA VACCINE  03/07/2016  . HEMOGLOBIN A1C  04/27/2016  . COLONOSCOPY  10/22/2017  . MAMMOGRAM  11/25/2017  . TETANUS/TDAP  05/10/2021  . DEXA SCAN  Completed  . ZOSTAVAX  Completed      Discussed health benefits of physical activity, and encouraged her to engage in regular exercise appropriate for her age and condition.  1. Dysuria UA was normal but positive for glucose. Patient is on Jardiance. - POCT urinalysis dipstick  2. Type 2 diabetes mellitus with complication, unspecified long term insulin use status (HCC) UA microalbumin  was 20.  -  POCT UA - Microalbumin  3. Need for pneumococcal vaccination Pneumococcal 23 given today without complication. Patient sat for 15 minutes after injection and tolerated well.  - Pneumococcal polysaccharide vaccine 23-valent greater than or equal to 2yo subcutaneous/IM  4. Need for hepatitis C screening test - Hepatitis C Antibody  ------------------------------------------------------------------------------------------------------------

## 2015-12-09 ENCOUNTER — Telehealth: Payer: Self-pay

## 2015-12-09 LAB — HEPATITIS C ANTIBODY

## 2015-12-09 NOTE — Telephone Encounter (Signed)
-----   Message from Mar Daring, PA-C sent at 12/09/2015  8:22 AM EDT ----- Negative Hep C

## 2015-12-09 NOTE — Telephone Encounter (Signed)
Patient advised as directed below.  Thanks,  -Lucretia Pendley 

## 2015-12-10 ENCOUNTER — Ambulatory Visit
Admission: RE | Admit: 2015-12-10 | Discharge: 2015-12-10 | Disposition: A | Discharge status: Home / Self Care | Payer: PPO | Source: Ambulatory Visit | Attending: Family Medicine | Admitting: Family Medicine

## 2015-12-10 DIAGNOSIS — R921 Mammographic calcification found on diagnostic imaging of breast: Secondary | ICD-10-CM | POA: Diagnosis not present

## 2015-12-10 DIAGNOSIS — R928 Other abnormal and inconclusive findings on diagnostic imaging of breast: Secondary | ICD-10-CM

## 2015-12-13 ENCOUNTER — Telehealth: Payer: Self-pay

## 2015-12-13 ENCOUNTER — Other Ambulatory Visit: Payer: Self-pay | Admitting: Family Medicine

## 2015-12-13 DIAGNOSIS — R928 Other abnormal and inconclusive findings on diagnostic imaging of breast: Secondary | ICD-10-CM

## 2015-12-13 DIAGNOSIS — R921 Mammographic calcification found on diagnostic imaging of breast: Secondary | ICD-10-CM

## 2015-12-13 NOTE — Telephone Encounter (Signed)
Advised patient as below. Please refer to surgery. Order has been placed. Thanks!

## 2015-12-13 NOTE — Telephone Encounter (Signed)
-----   Message from [name], MD sent at 12/11/2015  6:49 PM EDT ----- Refer to surgery.

## 2015-12-15 ENCOUNTER — Ambulatory Visit (INDEPENDENT_AMBULATORY_CARE_PROVIDER_SITE_OTHER): Payer: PPO | Admitting: General Surgery

## 2015-12-15 ENCOUNTER — Encounter: Payer: Self-pay | Admitting: Emergency Medicine

## 2015-12-15 ENCOUNTER — Encounter: Payer: Self-pay | Admitting: General Surgery

## 2015-12-15 VITALS — BP 108/60 | HR 82 | Resp 18 | Ht 62.0 in | Wt 189.0 lb

## 2015-12-15 DIAGNOSIS — R920 Mammographic microcalcification found on diagnostic imaging of breast: Secondary | ICD-10-CM

## 2015-12-15 NOTE — Telephone Encounter (Signed)
Note written

## 2015-12-15 NOTE — Progress Notes (Signed)
Patient ID: Teresa [name], female   DOB: 1946/06/26, 70 y.o.   MRN: [id number]  Chief Complaint  Patient presents with  . Other    mammogram    HPI Teresa [name] is a 70 y.o. female who presents for a breast evaluation. The most recent mammogram was done on 11/26/15, with repeat right views on 12/10/15. Patient does perform regular self breast checks and gets regular mammograms done. She states that she has had pain in the right breast for about 3-4 months that gets better and then worse at times. She states that the pain can be sharp at times. She denies any nipple discharge or breast lumps. She states no previous breast problems. She has a history of falling for the past 1-2 years.   The patient reports that she is going to be evaluated by a neurologist in the near future for her episodes of falling. While she denies any direct history of trauma to the breast, she does report that she tends to fall face forward and fortunately has not had any head injuries during these falls.  I personally reviewed the patient's history.    HPI  Past Medical History  Diagnosis Date  . Diabetes mellitus without complication ([place])   . Hypertension   . Gout   . 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 Thyroid disease   . GERD (gastroesophageal reflux disease)     Past Surgical History  Procedure Laterality Date  . Cardiac catheterization    . Fracture surgery      left ankle-plate and screws palced  . Nissen fundoplication    . Carpal tunnel release    . Dilation and curettage of uterus    . Abdominal hysterectomy      ovaries intact  . Tonsillectomy    . Shoulder surgery Right   . Lithotripsy    . Eye surgery      eyelid  . Bladder surgery      bladder tuck    Family History  Problem Relation Age of Onset  . Stroke Mother   . Hypertension Mother   . Heart disease Mother   . Arthritis Mother   . Heart disease Father   . Hypertension Father   . Diabetes Sister   . Hypertension Sister   .  Colon cancer Maternal Grandmother   . Breast cancer Maternal Grandmother   . Asthma Sister   . Hypertension Sister   . Diverticulitis Sister   . Breast cancer Maternal Aunt     Social History Social History  Substance Use Topics  . Smoking status: Never Smoker   . Smokeless tobacco: Never Used  . Alcohol Use: No    Allergies  Allergen Reactions  . Bacitracin-Neomycin-Polymyxin   . Clarithromycin Other (See Comments), Nausea Only and Nausea And Vomiting  . Codeine   . Dilaudid  [Hydromorphone Hcl] Nausea And Vomiting  . Hydromorphone Other (See Comments), Nausea Only and Nausea And Vomiting  . Iodine   . Iohexol      Desc: PT HAS HIVES/ITCHING   . Morphine   . Neomycin-Bacitracin Zn-Polymyx   . Oseltamivir Other (See Comments) and Nausea And Vomiting  . Tamiflu  [Oseltamivir Phosphate]     Other reaction(s): Abdominal Pain, Vomiting  . Zolpidem Other (See Comments)  . Zolpidem Tartrate   . Benzalkonium Chloride Itching, Rash and Swelling  . Lidocaine Hcl Itching, Rash and Swelling    Current Outpatient Prescriptions  Medication Sig Dispense Refill  .  allopurinol (ZYLOPRIM) 100 MG tablet TAKE 1 TABLET EVERY DAY 90 tablet 3  . atorvastatin (LIPITOR) 10 MG tablet TAKE 1 TABLET BY MOUTH EVERY DAY AT BEDTIME 90 tablet 1  . colchicine 0.6 MG tablet Take 0.6 mg by mouth daily.    . empagliflozin (JARDIANCE) 25 MG TABS tablet Take 25 mg by mouth daily. 30 tablet 12  . furosemide (LASIX) 40 MG tablet TAKE 1 TABLET EVERY DAY 90 tablet 3  . gabapentin (NEURONTIN) 400 MG capsule Take 1 capsule (400 mg total) by mouth 3 (three) times daily. 90 capsule 12  . glimepiride (AMARYL) 4 MG tablet Take 1 tablet (4 mg total) by mouth 2 (two) times daily. 60 tablet 11  . glucose blood (ACCU-CHEK COMPACT PLUS) test strip Check sugar twice daily DX: E11.9--NEED 90 DAY SUPPLY 100 each 12  . levothyroxine (SYNTHROID, LEVOTHROID) 88 MCG tablet TAKE 1 TABLET EVERY DAY 90 tablet 3  .  losartan-hydrochlorothiazide (HYZAAR) 50-12.5 MG per tablet TAKE 1 TABLET EVERY DAY 90 tablet 3  . metFORMIN (GLUCOPHAGE) 1000 MG tablet TAKE 1 TABLET BY MOUTH TWICE A DAY 180 tablet 3  . omeprazole (PRILOSEC) 20 MG capsule Take by mouth.    . ondansetron (ZOFRAN-ODT) 4 MG disintegrating tablet Take 4 mg by mouth every 8 (eight) hours as needed for nausea or vomiting.    . ONE TOUCH LANCETS MISC 1 each by Does not apply route daily. 30 each 12   No current facility-administered medications for this visit.    Review of Systems Review of Systems  Constitutional: Negative.   Respiratory: Negative.   Cardiovascular: Negative.     Blood pressure 108/60, pulse 82, resp. rate 18, height 5\' 2"  (1.575 m), weight 189 lb (85.73 kg).  Physical Exam Physical Exam  Constitutional: She is oriented to person, place, and time. She appears well-developed and well-nourished.  Eyes: Conjunctivae are normal. No scleral icterus.  Neck: Neck supple.  Cardiovascular: Normal rate and regular rhythm.   Murmur heard.  Systolic murmur is present with a grade of 2/6  Pulmonary/Chest: Effort normal and breath sounds normal. Right breast exhibits no inverted nipple, no mass, no nipple discharge, no skin change and no tenderness. Left breast exhibits no inverted nipple, no mass, no nipple discharge, no skin change and no tenderness. Breasts are asymmetrical (left breast 25% larger than right ).  Lymphadenopathy:    She has no cervical adenopathy.    She has no axillary adenopathy.  Neurological: She is alert and oriented to person, place, and time.  Skin: Skin is warm and dry.  Psychiatric: She has a normal mood and affect.    Data Reviewed 11/26/2015 bilateral screening and 12/10/2015 breast diagnostic mammograms reviewed. 2 new areas of microcalcifications in the right breast compared to the previous and most recent study of 2013. BIRAD-4.  Assessment    Microcalcifications of the right breast after long  interval.    Plan    The patient reports that she had an allergic reaction to lidocaine during carpal tunnel surgery in 2012 requiring a trip to the emergency room for facial swelling. Attempts to review the records through the [facility] system were unsuccessful tonight. This is an unusual reaction, and while the patient had expressed a desire for surgical excision rather than stereotactic biopsy, some sort of local anesthesia would be necessary during needle localization to make the procedure tolerable.  We'll attempt to review the records and confirm a true allergy to lidocaine. If so, premedication with steroids, H1  and H2 blockers may be adequate to allow for safe localization and biopsy.  The patient's history of multiple falls female resulted in occult breast trauma and subsequent calcifications.  With the long interval between her most recent mammograms and the 2013 studies, another reasonable option would be a 6 month follow-up. With review of the films this is a low probability lesion, especially at multiple foci and likely of well less than 5% chance of DCIS, with an even smaller risk of an invasive malignancy.  The patient has gone several years at a time without mammographic investigation, and she will consider how she would like to proceed while I investigate her allergic response.    PCP:  Cranford Mon, Richard L This has been scribed by Lesly Rubenstein LPN    Robert Bellow 12/15/2015, 9:18 PM

## 2015-12-15 NOTE — Telephone Encounter (Signed)
Letter has been written, he just needs to sign. I think she was needing a more formal letter.

## 2015-12-16 ENCOUNTER — Other Ambulatory Visit: Payer: Self-pay | Admitting: Family Medicine

## 2015-12-16 NOTE — Telephone Encounter (Signed)
Letter ready and pt advised-aa

## 2015-12-24 ENCOUNTER — Telehealth: Payer: Self-pay | Admitting: *Deleted

## 2015-12-24 NOTE — Telephone Encounter (Signed)
I have spoken to Dr Rosanna Randy, and am awaiting advice from an allergy specialist.  It will likely be  10-14 days before I have any information.

## 2015-12-24 NOTE — Telephone Encounter (Signed)
Patient called and wanted to know if you found out anything about proceeding with surgical excision on her breast. This is the patient that had a allergic reaction to lidocaine.

## 2015-12-28 ENCOUNTER — Telehealth: Payer: Self-pay | Admitting: Family Medicine

## 2015-12-28 NOTE — Telephone Encounter (Signed)
Pharmacy called needing clarification on her rx amaryl 4mg .  Pt. Is stating the dosing is incorrect.  Please advise.  Pharmacy call back is [phone]

## 2015-12-30 NOTE — Telephone Encounter (Signed)
Spoke with pharmacy RX was sent in for 1 daily and should have been twice daily-aa

## 2016-01-04 ENCOUNTER — Telehealth: Payer: Self-pay | Admitting: *Deleted

## 2016-01-04 NOTE — Telephone Encounter (Signed)
Patient called wants to know if you have heard anything back concerning her allergic reaction to lidocaine. Patient is anxious and would like to proceed with excision.

## 2016-01-05 NOTE — Telephone Encounter (Signed)
Notified patient that you will follow up when you have an answer. Patient verbalized understanding.

## 2016-01-05 NOTE — Telephone Encounter (Signed)
Not as of today. Will call to follow up letter previously sent.

## 2016-01-11 ENCOUNTER — Telehealth: Payer: Self-pay

## 2016-01-11 NOTE — Telephone Encounter (Signed)
The patient is scheduled to see Dr Donneta Romberg in his [facility] office on Friday July 21st at 8:15 am. She will need to stop all of her antihistamines three days prior. Patient is aware of date, time, and instructions.

## 2016-01-11 NOTE — Telephone Encounter (Signed)
-----   Message from Robert Bellow, MD sent at 01/07/2016 11:01 AM EDT ----- Please notify the patient that I am going to have her seen by Mosetta Anis, MD an allergy and immunology specialist at Dr. Rosanna Randy recommendation. He will be able to tell us what local anesthetics might be used for upcoming biopsies of the breast. I spoke to Dr. Donneta Romberg yesterday and he'll be expecting the patient.

## 2016-01-17 ENCOUNTER — Other Ambulatory Visit: Payer: Self-pay | Admitting: Family Medicine

## 2016-01-26 ENCOUNTER — Encounter: Payer: Self-pay | Admitting: Family Medicine

## 2016-01-26 ENCOUNTER — Other Ambulatory Visit: Payer: Self-pay | Admitting: Family Medicine

## 2016-01-26 ENCOUNTER — Ambulatory Visit (INDEPENDENT_AMBULATORY_CARE_PROVIDER_SITE_OTHER): Payer: PPO | Admitting: Family Medicine

## 2016-01-26 VITALS — BP 120/68 | Temp 98.3°F | Resp 16 | Wt 191.0 lb

## 2016-01-26 DIAGNOSIS — E039 Hypothyroidism, unspecified: Secondary | ICD-10-CM | POA: Diagnosis not present

## 2016-01-26 DIAGNOSIS — E119 Type 2 diabetes mellitus without complications: Secondary | ICD-10-CM | POA: Diagnosis not present

## 2016-01-26 DIAGNOSIS — R42 Dizziness and giddiness: Secondary | ICD-10-CM | POA: Diagnosis not present

## 2016-01-26 DIAGNOSIS — E118 Type 2 diabetes mellitus with unspecified complications: Secondary | ICD-10-CM | POA: Diagnosis not present

## 2016-01-26 LAB — POCT GLYCOSYLATED HEMOGLOBIN (HGB A1C)
Est. average glucose Bld gHb Est-mCnc: 220
Hemoglobin A1C: 9.3

## 2016-01-26 MED ORDER — MECLIZINE HCL 25 MG PO TABS: 25.0000 mg | ORAL_TABLET | Freq: Three times a day (TID) | ORAL | Status: DC | PRN

## 2016-01-26 MED ORDER — EMPAGLIFLOZIN 25 MG PO TABS
25.0000 mg | ORAL_TABLET | Freq: Every day | ORAL | Status: DC
Start: 2016-01-26 — End: 2017-03-18

## 2016-01-26 MED ORDER — PIOGLITAZONE HCL 15 MG PO TABS: 15.0000 mg | ORAL_TABLET | Freq: Every day | ORAL | Status: DC

## 2016-01-26 MED ORDER — MECLIZINE HCL 32 MG PO TABS: 32.0000 mg | ORAL_TABLET | Freq: Three times a day (TID) | ORAL | Status: DC | PRN

## 2016-01-26 NOTE — Progress Notes (Signed)
Patient ID: Teresa [name], female   DOB: 08/21/1945, 70 y.o.   MRN: [id number]       Patient: Teresa [name] Female    DOB: Jul 13, 1946   70 y.o.   MRN: [id number] Visit Date: 01/26/2016  Today's Provider: Wilhemena Durie, MD   Chief Complaint  Patient presents with  . Diabetes    3 month follow up   . Dizziness   Subjective:    HPI   Diabetes Mellitus Type II, Follow-up:   Lab Results  Component Value Date   HGBA1C 9.3 01/26/2016   HGBA1C 8.8 10/26/2015   HGBA1C 9.6* 07/26/2015    Last seen for diabetes 3 months ago.  Management since then includes adding Jardaince 25mg  daily. She reports good compliance with treatment. She is not having side effects.  Current symptoms include polydipsia and polyuria and have been stable. Home blood sugar records: fasting range: 170  Episodes of hypoglycemia? no   Most Recent Eye Exam: 1 year ago.  Weight trend: stable Prior visit with dietician: no Current diet: well balanced Current exercise: none  Pertinent Labs:    Component Value Date/Time   CHOL 142 07/26/2015 0905   CHOL 147 05/25/2014   CHOL 200 04/04/2013 1047   TRIG 183* 07/26/2015 0905   TRIG 191 04/04/2013 1047   HDL 40 07/26/2015 0905   HDL 41 05/25/2014   HDL 45 04/04/2013 1047   LDLCALC 65 07/26/2015 0905   LDLCALC 68 05/25/2014   LDLCALC 117* 04/04/2013 1047   CREATININE 0.75 07/26/2015 0905   CREATININE 0.7 05/25/2014   CREATININE 0.73 05/14/2014 0956    Wt Readings from Last 3 Encounters:  01/26/16 191 lb (86.637 kg)  12/15/15 189 lb (85.73 kg)  12/08/15 192 lb 3.2 oz (87.181 kg)      Dizziness  Patient also mentions that she has had dizziness that has worsened over the last week. Patient is requesting Antivert to be sent into the pharmacy. This has definitely help with the symptoms in the past.  Allergies  Allergen Reactions  . Bacitracin-Neomycin-Polymyxin   . Clarithromycin Other (See Comments), Nausea Only and Nausea And Vomiting  .  Codeine   . Dilaudid  [Hydromorphone Hcl] Nausea And Vomiting  . Hydromorphone Other (See Comments), Nausea Only and Nausea And Vomiting  . Iodine   . Iohexol      Desc: PT HAS HIVES/ITCHING   . Morphine   . Neomycin-Bacitracin Zn-Polymyx   . Oseltamivir Other (See Comments) and Nausea And Vomiting  . Tamiflu  [Oseltamivir Phosphate]     Other reaction(s): Abdominal Pain, Vomiting  . Zolpidem Other (See Comments)  . Zolpidem Tartrate   . Benzalkonium Chloride Itching, Rash and Swelling  . Lidocaine Hcl Itching, Rash and Swelling   Current Meds  Medication Sig  . allopurinol (ZYLOPRIM) 100 MG tablet TAKE 1 TABLET BY MOUTH EVERY DAY  . atorvastatin (LIPITOR) 10 MG tablet TAKE 1 TABLET BY MOUTH EVERY DAY AT BEDTIME  . colchicine 0.6 MG tablet Take 0.6 mg by mouth daily.  . empagliflozin (JARDIANCE) 25 MG TABS tablet Take 25 mg by mouth daily.  . furosemide (LASIX) 40 MG tablet TAKE 1 TABLET BY MOUTH EVERY DAY  . gabapentin (NEURONTIN) 400 MG capsule Take 1 capsule (400 mg total) by mouth 3 (three) times daily.  Marland Kitchen glimepiride (AMARYL) 4 MG tablet TAKE 1 TABLET BY MOUTH EVERY DAY  . glucose blood (ACCU-CHEK COMPACT PLUS) test strip Check sugar twice daily DX: E11.9--NEED 90 DAY  SUPPLY  . levothyroxine (SYNTHROID, LEVOTHROID) 88 MCG tablet TAKE 1 TABLET BY MOUTH EVERY DAY  . losartan-hydrochlorothiazide (HYZAAR) 50-12.5 MG tablet TAKE 1 TABLET BY MOUTH EVERY DAY  . metFORMIN (GLUCOPHAGE) 1000 MG tablet TAKE 1 TABLET BY MOUTH TWICE A DAY  . omeprazole (PRILOSEC) 20 MG capsule Take by mouth.  . ondansetron (ZOFRAN-ODT) 4 MG disintegrating tablet Take 4 mg by mouth every 8 (eight) hours as needed for nausea or vomiting.  . ONE TOUCH LANCETS MISC 1 each by Does not apply route daily.  . pioglitazone (ACTOS) 15 MG tablet Take 1 tablet (15 mg total) by mouth daily.  . [DISCONTINUED] empagliflozin (JARDIANCE) 25 MG TABS tablet Take 25 mg by mouth daily.  . [DISCONTINUED] pioglitazone (ACTOS) 15  MG tablet Take 15 mg by mouth daily.    Review of Systems  Constitutional: Negative.   Eyes: Negative.   Respiratory: Negative.   Cardiovascular: Negative.   Gastrointestinal: Negative.   Endocrine: Positive for polydipsia and polyuria. Negative for cold intolerance, heat intolerance and polyphagia.  Allergic/Immunologic: Negative.   Neurological: Positive for dizziness. Negative for tremors, speech difficulty, weakness, light-headedness, numbness and headaches.  Psychiatric/Behavioral: The patient is nervous/anxious.     Social History  Substance Use Topics  . Smoking status: Never Smoker   . Smokeless tobacco: Never Used  . Alcohol Use: No   Objective:   BP 120/68 mmHg  Temp(Src) 98.3 F (36.8 C)  Resp 16  Wt 191 lb (86.637 kg)  Physical Exam  Constitutional: She is oriented to person, place, and time. She appears well-developed and well-nourished.  HENT:  Head: Normocephalic and atraumatic.  Right Ear: External ear normal.  Left Ear: External ear normal.  Nose: Nose normal.  Eyes: Conjunctivae are normal.  Mild nystagmus.   Neck: Normal range of motion. Neck supple.  Cardiovascular: Normal rate and regular rhythm.   Murmur heard. Soft 2/6 systolic murmur  Pulmonary/Chest: Effort normal and breath sounds normal.  Abdominal: Soft.  Neurological: She is alert and oriented to person, place, and time. No cranial nerve deficit. She exhibits normal muscle tone. Coordination normal.  Skin: Skin is warm and dry.  Psychiatric: She has a normal mood and affect. Her behavior is normal. Judgment and thought content normal.        Assessment & Plan:     1. Type 2 diabetes mellitus with complication, unspecified long term insulin use status (HCC) Worsening. Patient's hgbA1c was 9.3 today. Will add Actos 15mg  daily and F/U in 1 month. Encouraged patient to bring in blood sugar readings and continue diet and exercise. No history of CHF. - POCT glycosylated hemoglobin (Hb  A1C) - pioglitazone (ACTOS) 15 MG tablet; Take 1 tablet (15 mg total) by mouth daily.  Dispense: 30 tablet; Refill: 12 - Comprehensive metabolic panel - empagliflozin (JARDIANCE) 25 MG TABS tablet; Take 25 mg by mouth daily.  Dispense: 90 tablet; Refill: 3 2. Dizziness Patient reports that this is recurrent. Prescription was sent for meclizine as patient reports that this has helped in the past. Patient will call if not improving.   3. Hypothyroidism, unspecified hypothyroidism type Stable. F/U pending labs.  - TSH        Gabryel Talamo Cranford Mon, MD  [facility]

## 2016-01-27 LAB — COMPREHENSIVE METABOLIC PANEL
ALBUMIN: 4.4 g/dL (ref 3.5–4.8)
ALK PHOS: 84 IU/L (ref 39–117)
ALT: 35 IU/L — ABNORMAL HIGH (ref 0–32)
AST: 25 IU/L (ref 0–40)
Albumin/Globulin Ratio: 1.5 (ref 1.2–2.2)
BUN / CREAT RATIO: 29 — AB (ref 12–28)
BUN: 18 mg/dL (ref 8–27)
Bilirubin Total: 0.4 mg/dL (ref 0.0–1.2)
CHLORIDE: 96 mmol/L (ref 96–106)
CO2: 25 mmol/L (ref 18–29)
Calcium: 9.8 mg/dL (ref 8.7–10.3)
Creatinine, Ser: 0.62 mg/dL (ref 0.57–1.00)
GFR calc non Af Amer: 92 mL/min/{1.73_m2} (ref >59)
GFR, EST AFRICAN AMERICAN: 106 mL/min/{1.73_m2} (ref >59)
GLUCOSE: 151 mg/dL — AB (ref 65–99)
Globulin, Total: 3 g/dL (ref 1.5–4.5)
POTASSIUM: 3.1 mmol/L — AB (ref 3.5–5.2)
SODIUM: 145 mmol/L — AB (ref 134–144)
Total Protein: 7.4 g/dL (ref 6.0–8.5)

## 2016-01-27 LAB — SPECIMEN STATUS REPORT

## 2016-01-27 LAB — TSH: TSH: 1.18 u[IU]/mL (ref 0.450–4.500)

## 2016-01-31 ENCOUNTER — Telehealth: Payer: Self-pay

## 2016-01-31 MED ORDER — POTASSIUM CHLORIDE ER 10 MEQ PO TBCR: 10.0000 meq | EXTENDED_RELEASE_TABLET | Freq: Two times a day (BID) | ORAL | Status: DC

## 2016-01-31 NOTE — Telephone Encounter (Signed)
-----   Message from [name], MD sent at 01/28/2016 10:37 AM EDT ----- Potassium low. Anytime the patient takes Lasix have her add potassium 10 mEq --2 daily on the days that she takes  Furosemide. Would repeat potassium in 3-6 weeks. Actually see her back and will repeat any appropriate lab work. Probably renal panel

## 2016-02-24 ENCOUNTER — Ambulatory Visit (INDEPENDENT_AMBULATORY_CARE_PROVIDER_SITE_OTHER): Payer: PPO | Admitting: Family Medicine

## 2016-02-24 ENCOUNTER — Ambulatory Visit: Payer: PPO | Admitting: Family Medicine

## 2016-02-24 VITALS — BP 118/62 | HR 60 | Temp 98.4°F | Resp 14 | Wt 189.0 lb

## 2016-02-24 DIAGNOSIS — M25511 Pain in right shoulder: Secondary | ICD-10-CM

## 2016-02-24 DIAGNOSIS — E118 Type 2 diabetes mellitus with unspecified complications: Secondary | ICD-10-CM

## 2016-02-24 DIAGNOSIS — E876 Hypokalemia: Secondary | ICD-10-CM | POA: Diagnosis not present

## 2016-02-24 DIAGNOSIS — R42 Dizziness and giddiness: Secondary | ICD-10-CM

## 2016-02-24 MED ORDER — MELOXICAM 7.5 MG PO TABS: 7.5000 mg | ORAL_TABLET | Freq: Every day | ORAL | Status: DC

## 2016-02-24 NOTE — Progress Notes (Signed)
Subjective:  HPI  Patient is here for 1 month follow up. On her last visit 01/26/16 routine labs were done and her pottassium level was low. She was advised to take KlorCon twice daily along with Lasix when she takes it, she has been taking this daily.  Her dizziness is still present and she has been taking Meclizine almost daily. She had appointment with neurologist but had to cancel it due to financial status so she needs to reschedule this.  Prior to Admission medications   Medication Sig Start Date End Date Taking? Authorizing Provider  allopurinol (ZYLOPRIM) 100 MG tablet TAKE 1 TABLET BY MOUTH EVERY DAY 01/17/16   [name], MD  atorvastatin (LIPITOR) 10 MG tablet TAKE 1 TABLET BY MOUTH EVERY DAY AT BEDTIME 10/18/15   [name], MD  colchicine 0.6 MG tablet Take 0.6 mg by mouth daily.    Historical Provider, MD  empagliflozin (JARDIANCE) 25 MG TABS tablet Take 25 mg by mouth daily. 01/26/16   [name], MD  furosemide (LASIX) 40 MG tablet TAKE 1 TABLET BY MOUTH EVERY DAY 01/17/16   [name], MD  gabapentin (NEURONTIN) 400 MG capsule Take 1 capsule (400 mg total) by mouth 3 (three) times daily. 11/01/15   Richard Maceo Pro., MD  glimepiride (AMARYL) 4 MG tablet TAKE 1 TABLET BY MOUTH EVERY DAY 12/17/15   Richard Maceo Pro., MD  glucose blood (ACCU-CHEK COMPACT PLUS) test strip Check sugar twice daily DX: E11.9--NEED 90 DAY SUPPLY 09/02/15   Richard Maceo Pro., MD  levothyroxine (SYNTHROID, LEVOTHROID) 88 MCG tablet TAKE 1 TABLET BY MOUTH EVERY DAY 01/17/16   [name], MD  losartan-hydrochlorothiazide [facility]) 50-12.5 MG tablet TAKE 1 TABLET BY MOUTH EVERY DAY 01/17/16   [name], MD  meclizine (ANTIVERT) 25 MG tablet Take 1 tablet (25 mg total) by mouth 3 (three) times daily as needed for dizziness. 01/26/16   [name], MD  metFORMIN (GLUCOPHAGE) 1000 MG tablet TAKE 1 TABLET BY MOUTH TWICE A DAY  01/17/16   Richard Maceo Pro., MD  omeprazole (PRILOSEC) 20 MG capsule Take by mouth. 10/05/14   Historical Provider, MD  ondansetron (ZOFRAN-ODT) 4 MG disintegrating tablet Take 4 mg by mouth every 8 (eight) hours as needed for nausea or vomiting.    Historical Provider, MD  ONE TOUCH LANCETS MISC 1 each by Does not apply route daily. 08/13/15   Richard Maceo Pro., MD  pioglitazone (ACTOS) 15 MG tablet Take 1 tablet (15 mg total) by mouth daily. 01/26/16   Richard Maceo Pro., MD  potassium chloride (K-DUR) 10 MEQ tablet Take 1 tablet (10 mEq total) by mouth 2 (two) times daily. 01/31/16   [name], MD    Patient Active Problem List   Diagnosis Date Noted  . Microcalcifications of the breast 12/15/2015  . Back pain, chronic 06/28/2015  . Chronic airway obstruction ([place]) 06/28/2015  . 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 HYPERLIPIDEMIA 06/21/2009  . HYPERTENSION 06/21/2009  . 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 FEVER UNSPECIFIED 06/21/2009  . NAUSEA 06/21/2009  . DYSPHAGIA 06/21/2009  . RUQ PAIN 06/21/2009  . HIATAL HERNIA 01/07/2008  . HYPOTHYROIDISM 11/22/2007  . ANXIETY 11/22/2007  . DEPRESSION 11/22/2007  . HYPERTENSIVE CARDIOVASCULAR DISEASE 11/22/2007  . 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 RENAL CALCULUS 11/22/2007  . DEGENERATIVE JOINT DISEASE 11/22/2007  . SLEEP APNEA 11/22/2007  .  HEADACHE, CHRONIC 11/22/2007  . GASTRITIS 10/23/2007  . DIVERTICULOSIS, COLON 10/23/2007  . Diabetes ([place]) 07/16/2007  . HYPERCHOLESTEROLEMIA 07/16/2007  . OBESITY, MODERATE 07/16/2007  . ISCHEMIC HEART DISEASE 07/16/2007  . ESOPHAGEAL REFLUX 07/16/2007  . SCLERODERMA 07/16/2007  . DYSPNEA 07/16/2007  . COUGH, CHRONIC 07/16/2007    Past Medical History  Diagnosis Date  . Diabetes mellitus without complication ([place])   . Hypertension   . Gout   . 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 Thyroid disease   . GERD (gastroesophageal reflux disease)     Social History   Social  History  . Marital Status: Divorced    Spouse Name: N/A  . Number of Children: N/A  . Years of Education: N/A   Occupational History  . Not on file.   Social History Main Topics  . Smoking status: Never Smoker   . Smokeless tobacco: Never Used  . Alcohol Use: No  . Drug Use: No  . Sexual Activity: Not on file   Other Topics Concern  . Not on file   Social History Narrative    Allergies  Allergen Reactions  . Bacitracin-Neomycin-Polymyxin   . Clarithromycin Other (See Comments), Nausea Only and Nausea And Vomiting  . Codeine   . Dilaudid  [Hydromorphone Hcl] Nausea And Vomiting  . Hydromorphone Other (See Comments), Nausea Only and Nausea And Vomiting  . Iodine   . Iohexol      Desc: PT HAS HIVES/ITCHING   . Morphine   . Neomycin-Bacitracin Zn-Polymyx   . Oseltamivir Other (See Comments) and Nausea And Vomiting  . Tamiflu  [Oseltamivir Phosphate]     Other reaction(s): Abdominal Pain, Vomiting  . Zolpidem Other (See Comments)  . Zolpidem Tartrate   . Benzalkonium Chloride Itching, Rash and Swelling  . Lidocaine Hcl Itching, Rash and Swelling    Review of Systems  Constitutional: Negative.   HENT: Negative.   Eyes: Negative.   Respiratory: Positive for shortness of breath (at times.). Negative for cough and wheezing.   Cardiovascular: Negative.   Gastrointestinal: Negative.   Genitourinary: Negative.   Musculoskeletal: Positive for myalgias, joint pain and falls.  Skin: Negative.   Neurological: Positive for dizziness (unsteady at times).  Endo/Heme/Allergies: Negative.   Psychiatric/Behavioral: Negative.     Immunization History  Administered Date(s) Administered  . Pneumococcal Conjugate-13 05/21/2014  . Pneumococcal Polysaccharide-23 11/29/2004, 07/11/2010, 12/08/2015  . Td 07/05/1994  . Tdap 05/11/2011  . Zoster 05/07/2008   Objective:  BP 118/62 mmHg  Pulse 60  Temp(Src) 98.4 F (36.9 C)  Resp 14  Wt 189 lb (85.73 kg)  Physical Exam    Constitutional: She is oriented to person, place, and time and well-developed, well-nourished, and in no distress.  HENT:  Head: Normocephalic and atraumatic.  Right Ear: External ear normal.  Left Ear: External ear normal.  Mouth/Throat: Oropharynx is clear and moist.  Eyes: Conjunctivae are normal. Pupils are equal, round, and reactive to light.  Neck: Normal range of motion. Neck supple.  Cardiovascular: Normal rate, regular rhythm, normal heart sounds and intact distal pulses.   No murmur heard. Pulmonary/Chest: Effort normal and breath sounds normal. No respiratory distress. She has no wheezes.  Musculoskeletal: She exhibits tenderness (right shoulder).  Neurological: She is alert and oriented to person, place, and time. She displays normal reflexes. No cranial nerve deficit. Gait normal. Coordination normal.  Skin: Skin is warm.  Psychiatric: Mood, memory, affect and judgment normal.    Lab Results  Component Value  Date   WBC 8.2 07/26/2015   HGB 13.8 05/14/2014   HCT 43.6 07/26/2015   PLT 252 07/26/2015   GLUCOSE 151* 01/26/2016   CHOL 142 07/26/2015   TRIG 183* 07/26/2015   HDL 40 07/26/2015   LDLCALC 65 07/26/2015   TSH 1.180 01/26/2016   HGBA1C 9.3 01/26/2016    CMP     Component Value Date/Time   NA 145* 01/26/2016 0941   NA 141 05/14/2014 0956   NA 141 06/21/2009 0920   K 3.1* 01/26/2016 0941   K 3.7 05/14/2014 0956   CL 96 01/26/2016 0941   CL 106 05/14/2014 0956   CO2 25 01/26/2016 0941   CO2 25 05/14/2014 0956   GLUCOSE 151* 01/26/2016 0941   GLUCOSE 142* 05/14/2014 0956   GLUCOSE 154* 06/21/2009 0920   BUN 18 01/26/2016 0941   BUN 16 05/14/2014 0956   BUN 13 06/21/2009 0920   CREATININE 0.62 01/26/2016 0941   CREATININE 0.7 05/25/2014   CREATININE 0.73 05/14/2014 0956   CALCIUM 9.8 01/26/2016 0941   CALCIUM 9.3 05/14/2014 0956   PROT 7.4 01/26/2016 0941   PROT 7.6 04/04/2013 1047   PROT 7.1 06/21/2009 0920   ALBUMIN 4.4 01/26/2016 0941    ALBUMIN 4.0 04/04/2013 1047   ALBUMIN 4.0 06/21/2009 0920   AST 25 01/26/2016 0941   AST 33 04/04/2013 1047   ALT 35* 01/26/2016 0941   ALT 39 04/04/2013 1047   ALKPHOS 84 01/26/2016 0941   ALKPHOS 91 04/04/2013 1047   BILITOT 0.4 01/26/2016 0941   BILITOT 0.4 04/04/2013 1047   BILITOT 0.6 06/21/2009 0920   GFRNONAA 92 01/26/2016 0941   GFRNONAA >60 05/14/2014 0956   GFRNONAA >60 04/04/2013 1047   GFRAA 106 01/26/2016 0941   GFRAA >60 05/14/2014 0956   GFRAA >60 04/04/2013 1047    Assessment and Plan :  1. Low serum potassium level Re check today.  2. Type 2 diabetes mellitus with complication, unspecified long term insulin use status ([place])  3. Dizziness Unchanged. Need to re schedule neurologist appointment. 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 4. Right shoulder pain Worse since falling out of her bed 1 week ago. Advised to use heat to the area and will try Meloxicam. Follow as needed. 5. Breast lesion Patient to have biopsy/excision soon. Patient was seen and examined by Dr. Eulas Post and note was scribed by Theressa Millard, RMA. 6. Obesity More than 50% of this visit is spent in counseling.   I have done the exam and reviewed the above chart and it is accurate to the best of my knowledge.  Miguel Aschoff MD [facility] 02/24/2016 1:39 PM

## 2016-02-25 ENCOUNTER — Telehealth: Payer: Self-pay

## 2016-02-25 DIAGNOSIS — T50995A Adverse effect of other drugs, medicaments and biological substances, initial encounter: Secondary | ICD-10-CM | POA: Diagnosis not present

## 2016-02-25 LAB — RENAL FUNCTION PANEL
Albumin: 4.5 g/dL (ref 3.5–4.8)
BUN/Creatinine Ratio: 27 (ref 12–28)
BUN: 16 mg/dL (ref 8–27)
CALCIUM: 9.7 mg/dL (ref 8.7–10.3)
CHLORIDE: 97 mmol/L (ref 96–106)
CO2: 23 mmol/L (ref 18–29)
CREATININE: 0.6 mg/dL (ref 0.57–1.00)
GFR calc Af Amer: 107 mL/min/{1.73_m2} (ref >59)
GFR, EST NON AFRICAN AMERICAN: 93 mL/min/{1.73_m2} (ref >59)
Glucose: 129 mg/dL — ABNORMAL HIGH (ref 65–99)
POTASSIUM: 4.4 mmol/L (ref 3.5–5.2)
Phosphorus: 3.9 mg/dL (ref 2.5–4.5)
Sodium: 140 mmol/L (ref 134–144)

## 2016-02-25 NOTE — Telephone Encounter (Signed)
Pt advised.   Thanks,   -Laura  

## 2016-02-25 NOTE — Telephone Encounter (Signed)
-----   Message from [name], MD sent at 02/25/2016 10:15 AM EDT ----- Lab  stable.

## 2016-03-02 ENCOUNTER — Encounter: Payer: Self-pay | Admitting: Family Medicine

## 2016-03-06 ENCOUNTER — Telehealth: Payer: Self-pay | Admitting: *Deleted

## 2016-03-06 NOTE — Telephone Encounter (Signed)
Patient is calling in regards to her allergy testing results for future surgery

## 2016-03-07 ENCOUNTER — Ambulatory Visit (INDEPENDENT_AMBULATORY_CARE_PROVIDER_SITE_OTHER): Payer: PPO | Admitting: General Surgery

## 2016-03-07 DIAGNOSIS — R920 Mammographic microcalcification found on diagnostic imaging of breast: Secondary | ICD-10-CM

## 2016-03-07 NOTE — Patient Instructions (Signed)
The patient is aware to call back for any questions or concerns.  

## 2016-03-07 NOTE — Progress Notes (Signed)
Patient ID: SURIE SMID, female   DOB: 08-11-1945, 70 y.o.   MRN: [id number]  Chief Complaint  Patient presents with  . Follow-up    discuss results    HPI Carrole Mcmoore Laporte is a 70 y.o. female. Presents to discuss options for management of the microcalcifications notified on screening mammograms obtained in May 2017. She just recently had her evaluation by the allergy/immunology section to confirm that she was not allergic to local anesthetics.       HPI  Past Medical History:  Diagnosis Date  . Diabetes mellitus without complication (HCC)   . GERD (gastroesophageal reflux disease)   . Gout   . Hypertension   . Scleroderma (HCC)    hands  . Thyroid disease     Past Surgical History:  Procedure Laterality Date  . ABDOMINAL HYSTERECTOMY     ovaries intact  . BLADDER SURGERY     bladder tuck  . CARDIAC CATHETERIZATION    . CARPAL TUNNEL RELEASE    . DILATION AND CURETTAGE OF UTERUS    . EYE SURGERY     eyelid  . FRACTURE SURGERY     left ankle-plate and screws palced  . LITHOTRIPSY    . NISSEN FUNDOPLICATION    . SHOULDER SURGERY Right   . TONSILLECTOMY      Family History  Problem Relation Age of Onset  . Stroke Mother   . Hypertension Mother   . Heart disease Mother   . Arthritis Mother   . Heart disease Father   . Hypertension Father   . Diabetes Sister   . Hypertension Sister   . Colon cancer Maternal Grandmother   . Breast cancer Maternal Grandmother   . Asthma Sister   . Hypertension Sister   . Diverticulitis Sister   . Breast cancer Maternal Aunt     Social History Social History  Substance Use Topics  . Smoking status: Never Smoker  . Smokeless tobacco: Never Used  . Alcohol use No    Allergies  Allergen Reactions  . Bacitracin-Neomycin-Polymyxin   . Clarithromycin Other (See Comments), Nausea Only and Nausea And Vomiting  . Codeine   . Dilaudid  [Hydromorphone Hcl] Nausea And Vomiting  . Hydromorphone Other (See Comments), Nausea  Only and Nausea And Vomiting  . Iodine   . Iohexol      Desc: PT HAS HIVES/ITCHING   . Morphine   . Neomycin-Bacitracin Zn-Polymyx   . Oseltamivir Other (See Comments) and Nausea And Vomiting  . Tamiflu  [Oseltamivir Phosphate]     Other reaction(s): Abdominal Pain, Vomiting  . Zolpidem Other (See Comments)  . Zolpidem Tartrate   . Benzalkonium Chloride Itching, Rash and Swelling  . Lidocaine Hcl Itching, Rash and Swelling    Current Outpatient Prescriptions  Medication Sig Dispense Refill  . allopurinol (ZYLOPRIM) 100 MG tablet TAKE 1 TABLET BY MOUTH EVERY DAY 90 tablet 3  . atorvastatin (LIPITOR) 10 MG tablet TAKE 1 TABLET BY MOUTH EVERY DAY AT BEDTIME 90 tablet 1  . colchicine 0.6 MG tablet Take 0.6 mg by mouth daily.    . empagliflozin (JARDIANCE) 25 MG TABS tablet Take 25 mg by mouth daily. 90 tablet 3  . furosemide (LASIX) 40 MG tablet TAKE 1 TABLET BY MOUTH EVERY DAY 90 tablet 3  . gabapentin (NEURONTIN) 400 MG capsule Take 1 capsule (400 mg total) by mouth 3 (three) times daily. 90 capsule 12  . glimepiride (AMARYL) 4 MG tablet TAKE 1 TABLET BY MOUTH EVERY  DAY 90 tablet 3  . glucose blood (ACCU-CHEK COMPACT PLUS) test strip Check sugar twice daily DX: E11.9--NEED 90 DAY SUPPLY 100 each 12  . levothyroxine (SYNTHROID, LEVOTHROID) 88 MCG tablet TAKE 1 TABLET BY MOUTH EVERY DAY 90 tablet 3  . losartan-hydrochlorothiazide (HYZAAR) 50-12.5 MG tablet TAKE 1 TABLET BY MOUTH EVERY DAY 90 tablet 3  . meclizine (ANTIVERT) 25 MG tablet Take 1 tablet (25 mg total) by mouth 3 (three) times daily as needed for dizziness. 30 tablet 0  . meloxicam (MOBIC) 7.5 MG tablet Take 1 tablet (7.5 mg total) by mouth daily. 30 tablet 0  . metFORMIN (GLUCOPHAGE) 1000 MG tablet TAKE 1 TABLET BY MOUTH TWICE A DAY 180 tablet 2  . omeprazole (PRILOSEC) 20 MG capsule Take by mouth.    . ondansetron (ZOFRAN-ODT) 4 MG disintegrating tablet Take 4 mg by mouth every 8 (eight) hours as needed for nausea or  vomiting.    . ONE TOUCH LANCETS MISC 1 each by Does not apply route daily. 30 each 12  . pioglitazone (ACTOS) 15 MG tablet Take 1 tablet (15 mg total) by mouth daily. 30 tablet 12  . potassium chloride (K-DUR) 10 MEQ tablet Take 1 tablet (10 mEq total) by mouth 2 (two) times daily. 60 tablet 5   No current facility-administered medications for this visit.     Review of Systems Review of Systems  All other systems reviewed and are negative.   There were no vitals taken for this visit.  Physical Exam Physical Exam  Constitutional: She appears well-developed and well-nourished.  HENT:  Head: Normocephalic.  Eyes: Pupils are equal, round, and reactive to light.  Neck: No thyromegaly present.  Cardiovascular: Normal rate and regular rhythm.   Pulmonary/Chest: Effort normal and breath sounds normal.    Data Reviewed The patient was evaluated by Mosetta Anis, MD [facility] allergy and immunology. This was done because of reported allergy to local anesthetics. His office notes of 02/28/2016 showed the patient had no reaction to Xylocaine (plane) sees and is at no greater risk from local anesthetic in the general population.  Assessment    Right breast microcalcifications.    Plan    Options for management were reviewed: 1) early biopsy (patient preference) versus 2) six-month follow-up (November 2017). With 2 foci for evaluation, if she tolerates the first biopsy well, would proceed to biopsy the second. If not, and the first biopsy was benign, would put the patient into a follow-up regimen.  Considering the small area involved, and with the report from the allergy section that she is at no greater risk for local anesthetic, I think she'll be best served having a stereotactic biopsy.  The patient is aware going on vacation and I was be glad to arrange for the radiology to service to complete the procedure. She declined.  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    Robert Bellow 03/07/2016, 4:29 PM

## 2016-03-08 ENCOUNTER — Other Ambulatory Visit: Payer: Self-pay

## 2016-03-08 DIAGNOSIS — R920 Mammographic microcalcification found on diagnostic imaging of breast: Secondary | ICD-10-CM

## 2016-03-09 ENCOUNTER — Other Ambulatory Visit: Payer: Self-pay | Admitting: General Surgery

## 2016-03-09 DIAGNOSIS — R920 Mammographic microcalcification found on diagnostic imaging of breast: Secondary | ICD-10-CM

## 2016-03-20 ENCOUNTER — Ambulatory Visit
Admission: RE | Admit: 2016-03-20 | Discharge: 2016-03-20 | Disposition: A | Discharge status: Home / Self Care | Payer: PPO | Source: Ambulatory Visit | Attending: General Surgery | Admitting: General Surgery

## 2016-03-20 DIAGNOSIS — R921 Mammographic calcification found on diagnostic imaging of breast: Secondary | ICD-10-CM | POA: Insufficient documentation

## 2016-03-20 DIAGNOSIS — R920 Mammographic microcalcification found on diagnostic imaging of breast: Secondary | ICD-10-CM

## 2016-03-20 DIAGNOSIS — D241 Benign neoplasm of right breast: Secondary | ICD-10-CM | POA: Diagnosis not present

## 2016-03-20 HISTORY — PX: BREAST BIOPSY: SHX20

## 2016-03-22 ENCOUNTER — Telehealth: Payer: Self-pay | Admitting: General Surgery

## 2016-03-22 LAB — SURGICAL PATHOLOGY

## 2016-03-22 NOTE — Telephone Encounter (Signed)
A phone message was left for the patient that her results were fine but a call back was requested to confirm she received the message.

## 2016-03-22 NOTE — Telephone Encounter (Signed)
The patient called back as requested. She was notified that her biopsies were benign. She reports one spot is still quite tender and she'll continued use ice for an additional 24 hours. Mild bruising noted. She'll report this all does not resolve within the next week. She is been encouraged to have a screening mammogram in May 2018 with her PCP.

## 2016-03-26 ENCOUNTER — Other Ambulatory Visit: Payer: Self-pay | Admitting: Family Medicine

## 2016-03-26 DIAGNOSIS — M25511 Pain in right shoulder: Secondary | ICD-10-CM

## 2016-03-27 ENCOUNTER — Ambulatory Visit (INDEPENDENT_AMBULATORY_CARE_PROVIDER_SITE_OTHER): Payer: PPO | Admitting: *Deleted

## 2016-03-27 DIAGNOSIS — R920 Mammographic microcalcification found on diagnostic imaging of breast: Secondary | ICD-10-CM

## 2016-03-27 DIAGNOSIS — H2513 Age-related nuclear cataract, bilateral: Secondary | ICD-10-CM | POA: Diagnosis not present

## 2016-03-27 NOTE — Progress Notes (Signed)
Patient here today for follow up post right breast biopsy.  Dressing removed, steristrip in place and aware it may come off in one week.  Minimal bruising noted.  The patient is aware that a heating pad may be used for comfort as needed.  Aware of pathology. Follow up as scheduled.

## 2016-03-31 ENCOUNTER — Other Ambulatory Visit: Payer: Self-pay

## 2016-04-03 ENCOUNTER — Encounter: Payer: Self-pay | Admitting: Family Medicine

## 2016-04-04 ENCOUNTER — Ambulatory Visit: Payer: PPO | Admitting: Podiatry

## 2016-04-11 ENCOUNTER — Other Ambulatory Visit: Payer: Self-pay

## 2016-04-11 DIAGNOSIS — E118 Type 2 diabetes mellitus with unspecified complications: Secondary | ICD-10-CM

## 2016-04-11 DIAGNOSIS — M25511 Pain in right shoulder: Secondary | ICD-10-CM

## 2016-04-11 MED ORDER — PIOGLITAZONE HCL 15 MG PO TABS: 15.0000 mg | ORAL_TABLET | Freq: Every day | ORAL | 3 refills | Status: DC

## 2016-04-12 MED ORDER — MELOXICAM 7.5 MG PO TABS: 7.5000 mg | ORAL_TABLET | Freq: Every day | ORAL | 1 refills | Status: DC

## 2016-04-12 MED ORDER — GABAPENTIN 400 MG PO CAPS: 400.0000 mg | ORAL_CAPSULE | Freq: Three times a day (TID) | ORAL | 3 refills | Status: DC

## 2016-04-12 MED ORDER — POTASSIUM CHLORIDE ER 10 MEQ PO TBCR: 10.0000 meq | EXTENDED_RELEASE_TABLET | Freq: Two times a day (BID) | ORAL | 3 refills | Status: DC

## 2016-04-15 ENCOUNTER — Other Ambulatory Visit: Payer: Self-pay | Admitting: Family Medicine

## 2016-05-15 ENCOUNTER — Telehealth: Payer: Self-pay | Admitting: *Deleted

## 2016-05-15 NOTE — Telephone Encounter (Signed)
Patient called because she got a bill in the mail saying that she owes $20.00. She called her insurance company and they told her that she shouldn't owe anything. She also called [facility] and they told her that was a copay for DOS 12/15/15. Patient Stated that she did pay her copay of 20.00 at the time of visit. She is confused to why she still has a balance.

## 2016-05-29 ENCOUNTER — Ambulatory Visit (INDEPENDENT_AMBULATORY_CARE_PROVIDER_SITE_OTHER): Payer: PPO | Admitting: Family Medicine

## 2016-05-29 VITALS — BP 130/68 | HR 68 | Temp 98.2°F | Resp 14 | Wt 195.0 lb

## 2016-05-29 DIAGNOSIS — Z23 Encounter for immunization: Secondary | ICD-10-CM | POA: Diagnosis not present

## 2016-05-29 DIAGNOSIS — R42 Dizziness and giddiness: Secondary | ICD-10-CM | POA: Diagnosis not present

## 2016-05-29 DIAGNOSIS — W19XXXS Unspecified fall, sequela: Secondary | ICD-10-CM

## 2016-05-29 DIAGNOSIS — E119 Type 2 diabetes mellitus without complications: Secondary | ICD-10-CM

## 2016-05-29 DIAGNOSIS — I10 Essential (primary) hypertension: Secondary | ICD-10-CM | POA: Diagnosis not present

## 2016-05-29 LAB — POCT GLYCOSYLATED HEMOGLOBIN (HGB A1C): Hemoglobin A1C: 7.9

## 2016-05-29 MED ORDER — GLIMEPIRIDE 4 MG PO TABS: 4.0000 mg | ORAL_TABLET | Freq: Two times a day (BID) | ORAL | 3 refills | Status: DC

## 2016-05-29 NOTE — Progress Notes (Signed)
Teresa [name]  MRN: [id number] DOB: May 07, 1946  Subjective:  HPI  Patient is here for follow up. Diabetes: patient checks her sugar twice daily and readings have been around 130-150s. Actos was added on her last visit in June.  Lab Results  Component Value Date   HGBA1C 9.3 01/26/2016   Hypertension: checks b/p sometimes and reading was 146/82. BP Readings from Last 3 Encounters:  05/29/16 130/68  02/24/16 118/62  01/26/16 120/68   Patient is still having dizziness and falling, since last visit falling 3 to 4 times. She is not sure why she just states she just falls. She was referred to neurologist for this before but due to finances she could not follow through. No injury from falls so far. Patient Active Problem List   Diagnosis Date Noted  . Microcalcifications of the breast 12/15/2015  . Back pain, chronic 06/28/2015  . Chronic airway obstruction ([place]) 06/28/2015  . Panlobular emphysema ([place]) 09/10/2014  . HYPERLIPIDEMIA 06/21/2009  . HYPERTENSION 06/21/2009  . 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 FEVER UNSPECIFIED 06/21/2009  . NAUSEA 06/21/2009  . DYSPHAGIA 06/21/2009  . RUQ PAIN 06/21/2009  . HIATAL HERNIA 01/07/2008  . HYPOTHYROIDISM 11/22/2007  . ANXIETY 11/22/2007  . DEPRESSION 11/22/2007  . HYPERTENSIVE CARDIOVASCULAR DISEASE 11/22/2007  . 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 RENAL CALCULUS 11/22/2007  . DEGENERATIVE JOINT DISEASE 11/22/2007  . SLEEP APNEA 11/22/2007  . HEADACHE, CHRONIC 11/22/2007  . GASTRITIS 10/23/2007  . DIVERTICULOSIS, COLON 10/23/2007  . Diabetes ([place]) 07/16/2007  . HYPERCHOLESTEROLEMIA 07/16/2007  . OBESITY, MODERATE 07/16/2007  . ISCHEMIC HEART DISEASE 07/16/2007  . ESOPHAGEAL REFLUX 07/16/2007  . SCLERODERMA 07/16/2007  . DYSPNEA 07/16/2007  . COUGH, CHRONIC 07/16/2007    Past Medical History:  Diagnosis Date  . Diabetes mellitus without complication ([place])   . GERD (gastroesophageal reflux disease)   . Gout   .  Hypertension   . Scleroderma ([place])    hands  . Thyroid disease     Social History   Social History  . Marital status: Divorced    Spouse name: N/A  . Number of children: N/A  . Years of education: N/A   Occupational History  . Not on file.   Social History Main Topics  . Smoking status: Never Smoker  . Smokeless tobacco: Never Used  . Alcohol use No  . Drug use: No  . Sexual activity: Not on file   Other Topics Concern  . Not on file   Social History Narrative  . No narrative on file    Outpatient Encounter Prescriptions as of 05/29/2016  Medication Sig Note  . allopurinol (ZYLOPRIM) 100 MG tablet TAKE 1 TABLET BY MOUTH EVERY DAY   . atorvastatin (LIPITOR) 10 MG tablet TAKE 1 TABLET BY MOUTH EVERY DAY AT BEDTIME   . colchicine 0.6 MG tablet Take 0.6 mg by mouth daily.   . empagliflozin (JARDIANCE) 25 MG TABS tablet Take 25 mg by mouth daily.   . furosemide (LASIX) 40 MG tablet TAKE 1 TABLET BY MOUTH EVERY DAY   . gabapentin (NEURONTIN) 400 MG capsule Take 1 capsule (400 mg total) by mouth 3 (three) times daily.   Marland Kitchen glimepiride (AMARYL) 4 MG tablet TAKE 1 TABLET BY MOUTH EVERY DAY   . glucose blood (ACCU-CHEK COMPACT PLUS) test strip Check sugar twice daily DX: E11.9--NEED 90 DAY SUPPLY   . levothyroxine (SYNTHROID, LEVOTHROID) 88 MCG tablet TAKE 1 TABLET BY MOUTH EVERY DAY   . losartan-hydrochlorothiazide (HYZAAR) 50-12.5 MG  tablet TAKE 1 TABLET BY MOUTH EVERY DAY   . meclizine (ANTIVERT) 25 MG tablet Take 1 tablet (25 mg total) by mouth 3 (three) times daily as needed for dizziness.   . metFORMIN (GLUCOPHAGE) 1000 MG tablet TAKE 1 TABLET BY MOUTH TWICE A DAY   . omeprazole (PRILOSEC) 20 MG capsule Take by mouth. 06/28/2015: Received from: [facility]  . ondansetron (ZOFRAN-ODT) 4 MG disintegrating tablet Take 4 mg by mouth every 8 (eight) hours as needed for nausea or vomiting.   . ONE TOUCH LANCETS MISC 1 each by Does not apply route daily.   .  pioglitazone (ACTOS) 15 MG tablet Take 1 tablet (15 mg total) by mouth daily.   . [DISCONTINUED] meloxicam (MOBIC) 7.5 MG tablet Take 1 tablet (7.5 mg total) by mouth daily.   . [DISCONTINUED] potassium chloride (K-DUR) 10 MEQ tablet Take 1 tablet (10 mEq total) by mouth 2 (two) times daily.    No facility-administered encounter medications on file as of 05/29/2016.     Allergies  Allergen Reactions  . Bacitracin-Neomycin-Polymyxin   . Clarithromycin Other (See Comments), Nausea Only and Nausea And Vomiting  . Codeine   . Dilaudid  [Hydromorphone Hcl] Nausea And Vomiting  . Hydromorphone Other (See Comments), Nausea Only and Nausea And Vomiting  . Iodine   . Iohexol      Desc: PT HAS HIVES/ITCHING   . Morphine   . Neomycin-Bacitracin Zn-Polymyx   . Oseltamivir Other (See Comments) and Nausea And Vomiting  . Tamiflu  [Oseltamivir Phosphate]     Other reaction(s): Abdominal Pain, Vomiting  . Zolpidem Other (See Comments)  . Zolpidem Tartrate   . Benzalkonium Chloride Itching, Rash and Swelling  . Lidocaine Hcl Itching, Rash and Swelling    Review of Systems  Constitutional: Negative.   Eyes: Negative.   Respiratory: Negative.   Cardiovascular: Negative.   Musculoskeletal: Positive for joint pain.  Skin: Negative.   Neurological: Positive for dizziness and tingling.  Endo/Heme/Allergies: Negative.   Psychiatric/Behavioral: Negative.    Objective:  BP 130/68   Pulse 68   Temp 98.2 F (36.8 C)   Resp 14   Wt 195 lb (88.5 kg)   BMI 35.67 kg/m   Physical Exam  Constitutional: She is oriented to person, place, and time and well-developed, well-nourished, and in no distress.  HENT:  Head: Normocephalic and atraumatic.  Right Ear: External ear normal.  Left Ear: External ear normal.  Nose: Nose normal.  Mouth/Throat: Oropharynx is clear and moist.  Eyes: Conjunctivae are normal. Pupils are equal, round, and reactive to light.  Neck: Normal range of motion. Neck  supple.  Cardiovascular: Normal rate, regular rhythm and intact distal pulses.   No murmur heard. Pulmonary/Chest: Effort normal and breath sounds normal. No respiratory distress. She has no wheezes.  Abdominal: Soft.  Neurological: She is alert and oriented to person, place, and time.  Skin: Skin is warm and dry.  Psychiatric: Mood, memory, affect and judgment normal.   Diabetic Foot Exam - Simple   Simple Foot Form Diabetic Foot exam was performed with the following findings:  Yes 05/29/2016  3:29 PM  Visual Inspection No deformities, no ulcerations, no other skin breakdown bilaterally:  Yes Sensation Testing Intact to touch and monofilament testing bilaterally:  Yes Pulse Check Posterior Tibialis and Dorsalis pulse intact bilaterally:  Yes Comments     Assessment and Plan :  1. Type 2 diabetes mellitus with complication, unspecified long term insulin use status (HCC) A1C  7.9 today. Better. Continue current medication.  2. Dizziness  4. Essential hypertension Stable. Continue current medication.  5. Fall, sequela Exam stable today. Will refer to neurology again and advised patient to follow through.Mrs. been advised in the past and patient has soft. She will follow through on the referral now. Neurologic exam today is normal.  6. Need for influenza vaccination - Flu vaccine HIGH DOSE PF (Fluzone High dose) 7. Hypothyroid HPI, Exam and A&P transcribed under direction and in the presence of Miguel Aschoff, MD. I have done the exam and reviewed the chart and it is accurate to the best of my knowledge. Miguel Aschoff M.D. [facility]

## 2016-07-12 DIAGNOSIS — E6609 Other obesity due to excess calories: Secondary | ICD-10-CM | POA: Diagnosis not present

## 2016-07-12 DIAGNOSIS — R296 Repeated falls: Secondary | ICD-10-CM | POA: Diagnosis not present

## 2016-07-12 DIAGNOSIS — R2681 Unsteadiness on feet: Secondary | ICD-10-CM | POA: Diagnosis not present

## 2016-07-12 DIAGNOSIS — Z6835 Body mass index (BMI) 35.0-35.9, adult: Secondary | ICD-10-CM | POA: Diagnosis not present

## 2016-08-23 ENCOUNTER — Ambulatory Visit: Payer: PPO | Admitting: Physical Therapy

## 2016-08-28 ENCOUNTER — Encounter: Payer: Self-pay | Admitting: Physical Therapy

## 2016-08-28 ENCOUNTER — Ambulatory Visit: Payer: PPO | Attending: Neurology | Admitting: Physical Therapy

## 2016-08-28 DIAGNOSIS — M6281 Muscle weakness (generalized): Secondary | ICD-10-CM

## 2016-08-28 DIAGNOSIS — R2681 Unsteadiness on feet: Secondary | ICD-10-CM

## 2016-08-28 NOTE — Therapy (Addendum)
[place] MAIN [facility] SERVICES [address] [place], [place], [address] Phone: [phone]   Fax:  [phone]  Physical Therapy Evaluation  Patient Details  Name: Teresa [name] MRN: [id number] Date of Birth: 10/22/1945 Referring Provider: Anabel Bene   Encounter Date: 08/28/2016      PT End of Session - 08/29/16 1152    Visit Number 1   Number of Visits 17   Date for PT Re-Evaluation 10/23/16   Authorization Type 1/10   PT Start Time 0345   PT Stop Time 0445   PT Time Calculation (min) 60 min   Equipment Utilized During Treatment Gait belt   Activity Tolerance Patient tolerated treatment well   Behavior During Therapy [facility] for tasks assessed/performed      Past Medical History:  Diagnosis Date  . Diabetes mellitus without complication ([place])   . GERD (gastroesophageal reflux disease)   . Gout   . Hypertension   . 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 Thyroid disease     Past Surgical History:  Procedure Laterality Date  . ABDOMINAL HYSTERECTOMY     ovaries intact  . BLADDER SURGERY     bladder tuck  . BREAST BIOPSY Right 03/20/2016   path pending x 2 area  . CARDIAC CATHETERIZATION    . CARPAL TUNNEL RELEASE    . DILATION AND CURETTAGE OF UTERUS    . EYE SURGERY     eyelid  . FRACTURE SURGERY     left ankle-plate and screws palced  . LITHOTRIPSY    . NISSEN FUNDOPLICATION    . SHOULDER SURGERY Right   . TONSILLECTOMY      There were no vitals filed for this visit.       Subjective Assessment - 08/28/16 1558    Subjective Pt states she was sent by neurologist due to frequent falls. Patient states she fell 3 or 4 times yesterday.     Pertinent History Patient has been having falls for the last 5 years. Patient had a shoulder repair surgery in 2016 and two weeks following surgery she had a fall which required another shoulder surgery.. She has fallen and hit her head, knees, and back during the falls. She is taking  BP medicine and takes her medicine regularly. She checked her BP the day before yesterday and it was 109/72. 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 She also had a back surgery in 2006, but they did not finish because she reports that she coded during the surgery. She states she has severe vertigo.    How long can you sit comfortably? not limited   How long can you stand comfortably? 15 minutes   How long can you walk comfortably? 30 minutes   Diagnostic tests none   Patient Stated Goals keep from falling   Currently in Pain? Yes   Pain Score 6    Pain Location Shoulder   Pain Orientation Right   Pain Descriptors / Indicators Burning;Dull   Pain Type Chronic pain   Pain Frequency Constant   Aggravating Factors  lying on right side makes her arm go numb   Pain Relieving Factors propping it up on the pillow   Effect of Pain on Daily Activities cannot do household chores without breaks   Multiple Pain Sites No  [facility] PT Assessment - 08/28/16 1607      Assessment   Medical Diagnosis unsteady gait   Referring Provider Gurney Maxin E    Onset Date/Surgical Date 07/25/16   Hand Dominance Right   Next MD Visit February 22   Prior Therapy --  yes for shoulder surgery in 2016     Precautions   Precautions Fall     Restrictions   Weight Bearing Restrictions No     Balance Screen   Has the patient fallen in the past 6 months Yes   How many times? 3-5 times per week   Has the patient had a decrease in activity level because of a fear of falling?  No   Is the patient reluctant to leave their home because of a fear of falling?  No     Home Environment   Living Environment Private residence   Living Arrangements Alone   Type of [place] to enter  13   Entrance Stairs-Number of Steps [place] One level   [place] None     Prior Function   Level of Independence Independent   Vocation Unemployed   Leisure play with grandchildren        PAIN: reports 6/10 pain in right shoulder  POSTURE: WFL   PROM/AROM: R shoulder decrease flexion, BUE shoulder abduction decreased    STRENGTH:  Graded on a 0-5 scale Muscle Group Left Right  Shoulder flex 4/5 2/5  Shoulder Abd 4/5 2/5  Shoulder Ext NT NT  Shoulder IR/ER NT NT  Elbow 4/5 3/5  Wrist/hand NT NT  Hip Flex 4/5 4/5  Hip Abd NT NT  Hip Add NT NT  Hip Ext NT NT  Hip IR/ER NT NT  Knee Flex 4/5 4/5  Knee Ext 4/5 4/5  Ankle DF 5/5 5/5  Ankle PF NT NT   SENSATION: numbness and tingling in feet and hands due to diabetic neuropathy  FUNCTIONAL MOBILITY:  Independent transfers sit to stand and independent bed mobility, unable to get into supine or prone position due to patient fear of getting vertigo.   BALANCE: poor dynamic standing balance with need for UE support during single leg stance, tandem stance, and standing with eyes closed.  GAIT:  Ambulates without AD in the community but states she relies a lot on the shopping cart to hold her up while grocery shopping, Patient has wide base of support and has a slow gait speed  OUTCOME MEASURES: TEST Outcome Interpretation  5 times sit<>stand 17.65 sec >60 yo, >15 sec indicates increased risk for falls  10 meter walk test           1.01    m/s <1.0 m/s indicates increased risk for falls; limited community ambulator  Timed up and Go          12.34      sec >14 sec indicates increased risk for falls                Therapeutic exercise: 3 way hip with resistance BLE x10  Pt required cueing in order to maintain upright position         PT Education - 08/28/16 1659    Education provided Yes   Education Details HEP of 3 way hip   Person(s) Educated Patient   Methods Explanation   Comprehension Verbalized understanding  PT Long Term Goals - 08/28/16 1712      PT LONG TERM GOAL #1   Title Patient will be independent in home exercise program in order to increase BLE hip strength   Baseline 4/5   Time 8   Period Weeks   Status New     PT LONG TERM GOAL #2   Title Patient will decrease her 5 times sit to stand to less than 15 seconds in order to decrease risk of falls.   Baseline 17.65 seconds   Time 8   Period Weeks   Status New     PT LONG TERM GOAL #3   Title Patient will increase her single leg stance time to greater than 15 seconds without UE support in order to improve balance   Baseline 10 seconds with UE support   Time 8   Period Weeks   Status New     PT LONG TERM GOAL #4   Title Patient will increase her ABC Scale to 25% in order to decrease her fear of falling   Baseline 3.75%   Time 8   Period Weeks   Status New           Plan - September 13, 2016 1111    Clinical Impression Statement This patient presents with 2 personal factors/ comorbidities lives alone and frequent falls, and 3 body elements including body structures and functions, activity limitations and or participation restrictions including decreased strength, decreased balance and decreased gait .  Patient's condition is evolving. Patient presents with Dx of unsteady gait and history of frequent falls. She has decreased LE strength, decreased dynamic standing balance, unsteady gait, and severely decreased confidence in her ability to maintain static or dynamic standing balance. Patient has outcome measures that indicate a falls risk and she will benefit from skilled physical  therapy in order to improve gait quality and decrease her risk of falls   Rehab Potential Fair   Clinical Impairments Affecting Rehab Potential This patient presents with 2 personal factors/ comorbidities lives alone and frequent falls, and 3 body elements including body structures and functions, activity limitations and or participation restrictions including  decreased strength, decreased balance and decreased gait. Patient's condition is evolving   PT Frequency 2x / week   PT Duration 8 weeks   PT Treatment/Interventions Aquatic Therapy;Cryotherapy;Electrical Stimulation;Functional mobility training;Stair training;Gait training;Ultrasound;Moist Heat;Therapeutic activities;Therapeutic exercise;Balance training;Neuromuscular re-education;Patient/family education   PT Next Visit Plan BERG balance and dynamic balance activities   PT Home Exercise Plan 3 way hip with red theraband   Consulted and Agree with Plan of Care Patient       Patient will benefit from skilled therapeutic intervention in order to improve the following deficits and impairments:  Abnormal gait, Decreased coordination, Difficulty walking, Pain, Decreased mobility, Decreased strength, Decreased balance, Decreased activity tolerance, Decreased endurance, Decreased safety awareness  Visit Diagnosis: Unsteady gait  Muscle weakness (generalized)      G-Codes - 09-13-16 1125    Functional Assessment Tool Used 5 x sit to stand, TUG, 10 MW,    Functional Limitation Mobility: Walking and moving around   Mobility: Walking and Moving Around Current Status [id number]) At least 40 percent but less than 60 percent impaired, limited or restricted   Mobility: Walking and Moving Around Goal Status [phone]) At least 20 percent but less than 40 percent impaired, limited or restricted       Problem List Patient Active Problem List   Diagnosis Date Noted  . Microcalcifications of the breast  12/15/2015  . Back pain, chronic 06/28/2015  . Chronic airway obstruction ([place]) 06/28/2015  . Panlobular emphysema ([place]) 09/10/2014  . HYPERLIPIDEMIA 06/21/2009  . HYPERTENSION 06/21/2009  . 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 FEVER UNSPECIFIED 06/21/2009  . NAUSEA 06/21/2009  . DYSPHAGIA 06/21/2009  . RUQ PAIN 06/21/2009  . HIATAL HERNIA 01/07/2008  . HYPOTHYROIDISM 11/22/2007  . ANXIETY  11/22/2007  . DEPRESSION 11/22/2007  . HYPERTENSIVE CARDIOVASCULAR DISEASE 11/22/2007  . 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 RENAL CALCULUS 11/22/2007  . DEGENERATIVE JOINT DISEASE 11/22/2007  . SLEEP APNEA 11/22/2007  . HEADACHE, CHRONIC 11/22/2007  . GASTRITIS 10/23/2007  . DIVERTICULOSIS, COLON 10/23/2007  . Diabetes ([place]) 07/16/2007  . HYPERCHOLESTEROLEMIA 07/16/2007  . OBESITY, MODERATE 07/16/2007  . ISCHEMIC HEART DISEASE 07/16/2007  . ESOPHAGEAL REFLUX 07/16/2007  . SCLERODERMA 07/16/2007  . DYSPNEA 07/16/2007  . COUGH, CHRONIC 07/16/2007  Alanson Puls, PT, DPT This entire session was performed under direct supervision and direction of a licensed therapist/therapist assistant . I have personally read, edited and approve of the note as written. Betsy Coder, SPT [place] 08/29/2016, 11:56 AM  [place] MAIN [facility] SERVICES [address] [place], [place], [address] Phone: [phone]   Fax:  [phone]  Name: Teresa [name] MRN: [id number] Date of Birth: 11-May-1946

## 2016-08-28 NOTE — Patient Instructions (Addendum)
(  Clinic) Balance: With Hip Abduction - Unilateral Stance (Resist)    Opposite side toward pulley, strap around left ankle, leg in. Swing leg across body and out to side. Use arm support only as needed for balance. Repeat ____ times per set. Do ____ sets per session. Do ____ sessions per week. Use ____ lb weights.  Copyright  VHI. All rights reserved.  (Clinic) Balance: With Hip Adduction - Unilateral Stance (Resist)    Left side toward pulley, strap around ankle, foot out to side. Swing foot across body and beyond. Use arm support for balance. Repeat ____ times per set. Do ____ sets per session. Do ____ sessions per week. Use ____ lb weights.  Copyright  VHI. All rights reserved.  Balance, Proprioception: Hip Extension With Tubing    With tubing attached to ankle of uninvolved leg, swing leg back. Return. Repeat ____ times or for ____ minutes. Do ____ sessions per day.  http://cc.exer.us/19   Copyright  VHI. All rights reserved.

## 2016-08-30 ENCOUNTER — Ambulatory Visit: Payer: PPO | Admitting: Physical Therapy

## 2016-08-30 DIAGNOSIS — M6281 Muscle weakness (generalized): Secondary | ICD-10-CM

## 2016-08-30 DIAGNOSIS — R2681 Unsteadiness on feet: Secondary | ICD-10-CM | POA: Diagnosis not present

## 2016-08-30 NOTE — Therapy (Signed)
[place] MAIN [facility] SERVICES [address] [place], [place], [address] Phone: [phone]   Fax:  [phone]  Physical Therapy Treatment  Patient Details  Name: Teresa [name] MRN: [id number] Date of Birth: Jul 24, 1946 Referring Provider: Anabel Bene   Encounter Date: 08/30/2016      PT End of Session - 08/30/16 1604    Visit Number 2   Number of Visits 17   Date for PT Re-Evaluation 10/23/16   Authorization Type 2/10   PT Start Time 0400   PT Stop Time 0445   PT Time Calculation (min) 45 min   Equipment Utilized During Treatment Gait belt   Activity Tolerance Patient tolerated treatment well;Patient limited by fatigue   Behavior During Therapy [facility] for tasks assessed/performed      Past Medical History:  Diagnosis Date  . Diabetes mellitus without complication ([place])   . GERD (gastroesophageal reflux disease)   . Gout   . Hypertension   . 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 Thyroid disease     Past Surgical History:  Procedure Laterality Date  . ABDOMINAL HYSTERECTOMY     ovaries intact  . BLADDER SURGERY     bladder tuck  . BREAST BIOPSY Right 03/20/2016   path pending x 2 area  . CARDIAC CATHETERIZATION    . CARPAL TUNNEL RELEASE    . DILATION AND CURETTAGE OF UTERUS    . EYE SURGERY     eyelid  . FRACTURE SURGERY     left ankle-plate and screws palced  . LITHOTRIPSY    . NISSEN FUNDOPLICATION    . SHOULDER SURGERY Right   . TONSILLECTOMY      There were no vitals filed for this visit.      Subjective Assessment - 08/30/16 1603    Subjective Patient states she is doing well today but her shoulder is bothering her. She states she fell twice yesterday.   Pertinent History Patient has been having issue with falls within the last 5 years but she has no idea when it first started. Patient had shoulder repair surgery in 2016 and two weeks into the recovery had a fall which required another shoulder surgery to repair.  This happened once again. She has hit her head, knees, and back during the falls. She is on BP medicine and take medicine regularly. Took BP the day before yesterday and it was 109/72. 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 She also had a back surgery in 2006, but they did not finish because she coded.  She states she has severe vertigo and that her doctor told her it's the worst case he has ever seen.   How long can you sit comfortably? not limited   How long can you stand comfortably? 15 minutes   How long can you walk comfortably? 30 minutes   Diagnostic tests none   Patient Stated Goals keep from falling   Currently in Pain? Yes   Pain Score 7    Pain Location Shoulder   Pain Orientation Right   Pain Descriptors / Indicators Burning;Dull   Pain Type Chronic pain   Pain Frequency Constant   Multiple Pain Sites No       Therapeutic exercise:  NuStep 5 minutes 3 way hip with red theraband 15x2 Heel raises in // bars x15 Toe raises in //  bars x15 Leg press 100 lbs x15  NMR:  Blue foam with feet apart, feet together, feet together with eyes closed, tandem x2 minutes each Rocker board fwd/bwd/side/side 2 minutes each Blue foam shape sorting x5 minutes  Patient required consistent cueing to maintain center of gravity during all balance activities and required consistent cueing to maintain correct position during therapeutic exercise. Patient originally was instructed to sort cones from side to side, but this produced her vertigo symptoms so we discontinued cone sorting and sorted shapes instead.        PT Education - 08/30/16 1704    Education provided Yes   Education Details safety with HEP of 3 way hip at home   Person(s) Educated Patient   Methods Explanation   Comprehension Verbalized understanding             PT Long Term Goals - 08/28/16 1712      PT LONG  TERM GOAL #1   Title Patient will be independent in home exercise program in order to increase BLE hip strength   Baseline 4/5   Time 8   Period Weeks   Status New     PT LONG TERM GOAL #2   Title Patient will decrease her 5 times sit to stand to less than 15 seconds in order to decrease risk of falls.   Baseline 17.65 seconds   Time 8   Period Weeks   Status New     PT LONG TERM GOAL #3   Title Patient will increase her single leg stance time to greater than 15 seconds without UE support in order to improve balance   Baseline 10 seconds with UE support   Time 8   Period Weeks   Status New     PT LONG TERM GOAL #4   Title Patient will increase her ABC Scale to 25% in order to decrease her fear of falling   Baseline 3.75%   Time 8   Period Weeks   Status New               Plan - 08/30/16 1705    Clinical Impression Statement Patient requires continuous cueing throughout session in order to maintain center of gravity during balance exercises and required rest breaks to recompose herself. She demonstrates a severe fear of falling and falls several times per day. Patient will benefit from continued skilled therapy in order to improve endurance and dynamic standing balance, and decrease falls.   Rehab Potential Fair   Clinical Impairments Affecting Rehab Potential This patient presents with 2 personal factors/ comorbidities lives alone and frequent falls, and 3 body elements including body structures and functions, activity limitations and or participation restrictions including decreased strength, decreased balance and decreased gait. Patient's condition is evolving   PT Frequency 2x / week   PT Duration 8 weeks   PT Treatment/Interventions Aquatic Therapy;Cryotherapy;Electrical Stimulation;Functional mobility training;Stair training;Gait training;Ultrasound;Moist Heat;Therapeutic activities;Therapeutic exercise;Balance training;Neuromuscular re-education;Patient/family  education   PT Next Visit Plan BERG balance and dynamic balance activities   PT Home Exercise Plan 3 way hip with red theraband   Consulted and Agree with Plan of Care Patient      Patient will benefit from skilled therapeutic intervention in order to improve the following deficits and impairments:  Abnormal gait, Decreased coordination, Difficulty walking, Pain, Decreased mobility, Decreased strength, Decreased balance, Decreased activity tolerance, Decreased endurance, Decreased safety awareness  Visit Diagnosis: Unsteady gait  Muscle weakness (generalized)       G-Codes -  08/29/16 1125    Functional Assessment Tool Used 5 x sit to stand, TUG, 10 MW,    Functional Limitation Mobility: Walking and moving around   Mobility: Walking and Moving Around Current Status [phone]) At least 40 percent but less than 60 percent impaired, limited or restricted   Mobility: Walking and Moving Around Goal Status [phone]) At least 20 percent but less than 40 percent impaired, limited or restricted      Problem List Patient Active Problem List   Diagnosis Date Noted  . Microcalcifications of the breast 12/15/2015  . Back pain, chronic 06/28/2015  . Chronic airway obstruction ([place]) 06/28/2015  . Panlobular emphysema ([place]) 09/10/2014  . HYPERLIPIDEMIA 06/21/2009  . HYPERTENSION 06/21/2009  . 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 FEVER UNSPECIFIED 06/21/2009  . NAUSEA 06/21/2009  . DYSPHAGIA 06/21/2009  . RUQ PAIN 06/21/2009  . HIATAL HERNIA 01/07/2008  . HYPOTHYROIDISM 11/22/2007  . ANXIETY 11/22/2007  . DEPRESSION 11/22/2007  . HYPERTENSIVE CARDIOVASCULAR DISEASE 11/22/2007  . 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 RENAL CALCULUS 11/22/2007  . DEGENERATIVE JOINT DISEASE 11/22/2007  . SLEEP APNEA 11/22/2007  . HEADACHE, CHRONIC 11/22/2007  . GASTRITIS 10/23/2007  . DIVERTICULOSIS, COLON 10/23/2007  . Diabetes ([place]) 07/16/2007  . HYPERCHOLESTEROLEMIA 07/16/2007  . OBESITY, MODERATE 07/16/2007   . ISCHEMIC HEART DISEASE 07/16/2007  . ESOPHAGEAL REFLUX 07/16/2007  . SCLERODERMA 07/16/2007  . DYSPNEA 07/16/2007  . COUGH, CHRONIC 07/16/2007   Alanson Puls, PT, DPT This entire session was performed under direct supervision and direction of a licensed therapist/therapist assistant . I have personally read, edited and approve of the note as written. Betsy Coder, SPT Betsy Coder 08/30/2016, 5:11 PM  [place] MAIN [facility] SERVICES [address] [place], [place], [address] Phone: [phone]   Fax:  [phone]  Name: QUINTASIA ROKES MRN: [id number] Date of Birth: 22-Oct-1945

## 2016-09-04 ENCOUNTER — Ambulatory Visit: Payer: PPO | Admitting: Physical Therapy

## 2016-09-04 ENCOUNTER — Encounter: Payer: Self-pay | Admitting: Physical Therapy

## 2016-09-04 ENCOUNTER — Encounter: Payer: PPO | Admitting: Physical Therapy

## 2016-09-04 DIAGNOSIS — M6281 Muscle weakness (generalized): Secondary | ICD-10-CM

## 2016-09-04 DIAGNOSIS — R2681 Unsteadiness on feet: Secondary | ICD-10-CM

## 2016-09-04 NOTE — Therapy (Signed)
[place] MAIN [facility] SERVICES [address] [place], [place], [address] Phone: [phone]   Fax:  [phone]  Physical Therapy Treatment  Patient Details  Name: Teresa [name] MRN: [id number] Date of Birth: Apr 09, 1946 Referring Provider: Anabel Bene   Encounter Date: 09/04/2016      PT End of Session - 09/04/16 0838    Visit Number 3   Number of Visits 17   Date for PT Re-Evaluation 10/23/16   Authorization Type 3/10   PT Start Time 0835   PT Stop Time 0915   PT Time Calculation (min) 40 min   Equipment Utilized During Treatment Gait belt   Activity Tolerance Patient tolerated treatment well;Patient limited by fatigue   Behavior During Therapy [facility] for tasks assessed/performed      Past Medical History:  Diagnosis Date  . Diabetes mellitus without complication ([place])   . GERD (gastroesophageal reflux disease)   . Gout   . Hypertension   . 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 Thyroid disease     Past Surgical History:  Procedure Laterality Date  . ABDOMINAL HYSTERECTOMY     ovaries intact  . BLADDER SURGERY     bladder tuck  . BREAST BIOPSY Right 03/20/2016   path pending x 2 area  . CARDIAC CATHETERIZATION    . CARPAL TUNNEL RELEASE    . DILATION AND CURETTAGE OF UTERUS    . EYE SURGERY     eyelid  . FRACTURE SURGERY     left ankle-plate and screws palced  . LITHOTRIPSY    . NISSEN FUNDOPLICATION    . SHOULDER SURGERY Right   . TONSILLECTOMY      There were no vitals filed for this visit.      Subjective Assessment - 09/04/16 0835    Subjective Patient states she is doing okay today but reports soreness in her legs after last session. She states her shoulder is bothering her today "because of the rain." Patient also reports she fell 3 times yesterday, and has fallen 5-6 times since her last therapy session.   Pertinent History Patient has been having issue with falls within the last 5 years but she has no idea  when it first started. Patient had shoulder repair surgery in 2016 and two weeks into the recovery had a fall which required another shoulder surgery to repair. This happened once again. She has hit her head, knees, and back during the falls. She is on BP medicine and take medicine regularly. Took BP the day before yesterday and it was 109/72. 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 She also had a back surgery in 2006, but they did not finish because she coded.  She states she has severe vertigo and that her doctor told her it's the worst case he has ever seen.   How long can you sit comfortably? not limited   How long can you stand comfortably? 15 minutes   How long can you walk comfortably? 30 minutes   Diagnostic tests none   Patient Stated Goals keep from falling   Currently in Pain? Yes   Pain Score 8    Pain Location Shoulder   Pain Orientation Right   Pain Descriptors / Indicators Burning;Dull   Pain Type Chronic pain   Pain Onset More than a month ago   Pain Frequency  Constant   Multiple Pain Sites No        Therapeutic exercise:  NuStep 5 minutes 3 way hip with red theraband 15x2 Heel raises in // bars 15x2 Toe raises in // bars 15x2 Leg press 100 lbs 15x3  NMR:  Purple foam tandem stance RLE in front x2 minutes Purple foam tandem stance LLE in front x2 minutes Purple foam toe taps to 6 inch stool 20x2 Purple foam step ups to 6 inch stool 20x2 Purple foam side steps up BLE to 6 inch stool 20x2 each Rocker board fwd/bwd/side/side 2 minutes each Blue foam board flat side down x2 minutes Blue foam board flat side up x2 minutes  Patient required verbal cueing and CGA during all dynamic balance activities. Patient stated frequently she was exhausted, but refused rest breaks.             PT Education - 09/04/16 [phone]    Education provided Yes   Education  Details importance of continuing 3 way hip at home, and importance of strengthening hip abductors   Person(s) Educated Patient   Methods Explanation;Demonstration   Comprehension Verbalized understanding             PT Long Term Goals - 08/28/16 1712      PT LONG TERM GOAL #1   Title Patient will be independent in home exercise program in order to increase BLE hip strength   Baseline 4/5   Time 8   Period Weeks   Status New     PT LONG TERM GOAL #2   Title Patient will decrease her 5 times sit to stand to less than 15 seconds in order to decrease risk of falls.   Baseline 17.65 seconds   Time 8   Period Weeks   Status New     PT LONG TERM GOAL #3   Title Patient will increase her single leg stance time to greater than 15 seconds without UE support in order to improve balance   Baseline 10 seconds with UE support   Time 8   Period Weeks   Status New     PT LONG TERM GOAL #4   Title Patient will increase her ABC Scale to 25% in order to decrease her fear of falling   Baseline 3.75%   Time 8   Period Weeks   Status New               Plan - 09/04/16 [id number]    Clinical Impression Statement Patient requires consistent verbal cueing during all dynamic standing balance activities in order to maintain center of gravity. She required standing rest breaks, but refused to sit. She presents with a severe fear of falling and will benefit from continued skilled therapy in order to improve dynamic standing balance and decrease falls risk.   Rehab Potential Fair   Clinical Impairments Affecting Rehab Potential This patient presents with 2 personal factors/ comorbidities lives alone and frequent falls, and 3 body elements including body structures and functions, activity limitations and or participation restrictions including decreased strength, decreased balance and decreased gait. Patient's condition is evolving   PT Frequency 2x / week   PT Duration 8 weeks   PT  Treatment/Interventions Aquatic Therapy;Cryotherapy;Electrical Stimulation;Functional mobility training;Stair training;Gait training;Ultrasound;Moist Heat;Therapeutic activities;Therapeutic exercise;Balance training;Neuromuscular re-education;Patient/family education   PT Next Visit Plan BERG balance and dynamic balance activities   PT Home Exercise Plan 3 way hip with red theraband   Consulted and Agree with Plan of Care Patient  Patient will benefit from skilled therapeutic intervention in order to improve the following deficits and impairments:  Abnormal gait, Decreased coordination, Difficulty walking, Pain, Decreased mobility, Decreased strength, Decreased balance, Decreased activity tolerance, Decreased endurance, Decreased safety awareness  Visit Diagnosis: Unsteady gait  Muscle weakness (generalized)     Problem List Patient Active Problem List   Diagnosis Date Noted  . Microcalcifications of the breast 12/15/2015  . Back pain, chronic 06/28/2015  . Chronic airway obstruction ([place]) 06/28/2015  . Panlobular emphysema ([place]) 09/10/2014  . HYPERLIPIDEMIA 06/21/2009  . HYPERTENSION 06/21/2009  . 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 FEVER UNSPECIFIED 06/21/2009  . NAUSEA 06/21/2009  . DYSPHAGIA 06/21/2009  . RUQ PAIN 06/21/2009  . HIATAL HERNIA 01/07/2008  . HYPOTHYROIDISM 11/22/2007  . ANXIETY 11/22/2007  . DEPRESSION 11/22/2007  . HYPERTENSIVE CARDIOVASCULAR DISEASE 11/22/2007  . 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 RENAL CALCULUS 11/22/2007  . DEGENERATIVE JOINT DISEASE 11/22/2007  . SLEEP APNEA 11/22/2007  . HEADACHE, CHRONIC 11/22/2007  . GASTRITIS 10/23/2007  . DIVERTICULOSIS, COLON 10/23/2007  . Diabetes ([place]) 07/16/2007  . HYPERCHOLESTEROLEMIA 07/16/2007  . OBESITY, MODERATE 07/16/2007  . ISCHEMIC HEART DISEASE 07/16/2007  . ESOPHAGEAL REFLUX 07/16/2007  . SCLERODERMA 07/16/2007  . DYSPNEA 07/16/2007  . COUGH, CHRONIC 07/16/2007  Alanson Puls, PT,  DPT This entire session was performed under direct supervision and direction of a licensed therapist/therapist assistant . I have personally read, edited and approve of the note as written. Betsy Coder, SPT [place], Sherryl Barters 09/04/2016, 10:01 AM  [place] MAIN [facility] SERVICES [address] [place], [place], [address] Phone: [phone]   Fax:  [phone]  Name: Teresa [name] MRN: [id number] Date of Birth: 1946-05-20

## 2016-09-06 ENCOUNTER — Ambulatory Visit: Payer: PPO | Admitting: Physical Therapy

## 2016-09-06 ENCOUNTER — Encounter: Payer: Self-pay | Admitting: Physical Therapy

## 2016-09-06 DIAGNOSIS — R2681 Unsteadiness on feet: Secondary | ICD-10-CM | POA: Diagnosis not present

## 2016-09-06 DIAGNOSIS — M6281 Muscle weakness (generalized): Secondary | ICD-10-CM

## 2016-09-06 NOTE — Therapy (Signed)
[place] MAIN [facility] SERVICES [address] [place], [place], [address] Phone: [phone]   Fax:  [phone]  Physical Therapy Treatment  Patient Details  Name: Teresa [name] MRN: [id number] Date of Birth: July 22, 1946 Referring Provider: Anabel Bene   Encounter Date: 09/06/2016      PT End of Session - 09/06/16 0916    Visit Number 4   Number of Visits 17   Date for PT Re-Evaluation 10/23/16   Authorization Type 4/10   PT Start Time 0830   PT Stop Time 0915   PT Time Calculation (min) 45 min   Equipment Utilized During Treatment Gait belt   Activity Tolerance Patient tolerated treatment well;Patient limited by fatigue   Behavior During Therapy [facility] for tasks assessed/performed      Past Medical History:  Diagnosis Date  . Diabetes mellitus without complication ([place])   . GERD (gastroesophageal reflux disease)   . Gout   . Hypertension   . 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 Thyroid disease     Past Surgical History:  Procedure Laterality Date  . ABDOMINAL HYSTERECTOMY     ovaries intact  . BLADDER SURGERY     bladder tuck  . BREAST BIOPSY Right 03/20/2016   path pending x 2 area  . CARDIAC CATHETERIZATION    . CARPAL TUNNEL RELEASE    . DILATION AND CURETTAGE OF UTERUS    . EYE SURGERY     eyelid  . FRACTURE SURGERY     left ankle-plate and screws palced  . LITHOTRIPSY    . NISSEN FUNDOPLICATION    . SHOULDER SURGERY Right   . TONSILLECTOMY      There were no vitals filed for this visit.      Subjective Assessment - 09/06/16 0832    Subjective Patient states she is doing well and hip abductors are still a little sore from the previous session. She states her shoulder is not hurting too bad today. Patient reports she fell 4 times since her appointment two days ago, but states most of her falls are falling back into the chair after attempting to stand.   Pertinent History Patient has been having issue with falls  within the last 5 years but she has no idea when it first started. Patient had shoulder repair surgery in 2016 and two weeks into the recovery had a fall which required another shoulder surgery to repair. This happened once again. She has hit her head, knees, and back during the falls. She is on BP medicine and take medicine regularly. Took BP the day before yesterday and it was 109/72. 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 She also had a back surgery in 2006, but they did not finish because she coded.  She states she has severe vertigo and that her doctor told her it's the worst case he has ever seen.   How long can you sit comfortably? not limited   How long can you stand comfortably? 15 minutes   How long can you walk comfortably? 30 minutes   Diagnostic tests none   Patient Stated Goals keep from falling   Currently in Pain? Yes   Pain Score 5    Pain Location Shoulder   Pain Orientation Right   Pain Descriptors / Indicators Burning;Dull   Pain Type Chronic pain   Pain  Onset More than a month ago   Pain Frequency Constant   Multiple Pain Sites No      Therapeutic exercise:  NuStep 5 minutes 3 way hip with red theraband x15 Heel raises in // bars x15 Toe raises in // bars x15 Leg press 100 lbs 15x3 Mini squats in // bars x15   NMR:  Purple foam tandem stance RLE in front x2 minutes Purple foam tandem stance LLE in front x2 minutes Purple foam toe taps to 6 inch stool 15x2 Purple foam step ups to 6 inch stool 15x2 Purple foam side step ups BLE to 6 inch stool 15x2 each Sit to stand training 10x2 Rocker board fwd/bwd/side/side x2 minutes each 1/2 foam flat side down x2 minutes 1/2 foam flat side up x2 minutes   Patient required verbal cueing and CGA during all dynamic balance activities. Patient improved greatly in her sit to stand performance with only minimal loss of  balance.          PT Education - 09/06/16 [phone]    Education provided Yes   Education Details patient educated on importance of forward weight shift during sit to stand due to patient's complaint of frequently falling back in to chair during sit to stands at home   Person(s) Educated Patient   Methods Explanation   Comprehension Verbalized understanding             PT Long Term Goals - 08/28/16 1712      PT LONG TERM GOAL #1   Title Patient will be independent in home exercise program in order to increase BLE hip strength   Baseline 4/5   Time 8   Period Weeks   Status New     PT LONG TERM GOAL #2   Title Patient will decrease her 5 times sit to stand to less than 15 seconds in order to decrease risk of falls.   Baseline 17.65 seconds   Time 8   Period Weeks   Status New     PT LONG TERM GOAL #3   Title Patient will increase her single leg stance time to greater than 15 seconds without UE support in order to improve balance   Baseline 10 seconds with UE support   Time 8   Period Weeks   Status New     PT LONG TERM GOAL #4   Title Patient will increase her ABC Scale to 25% in order to decrease her fear of falling   Baseline 3.75%   Time 8   Period Weeks   Status New               Plan - 09/06/16 0917    Clinical Impression Statement Patient requires verbal cueing and CGA during all dynamic balance activities. Patient required occasional rest breaks but her overall endurance improved between this session and the previous one. Patient's performance during sit to stands also greatly improved with minimal loss of balance after patient educated on forward weight shift. Patient will continue to benefit from skilled therapy in order to decrease risk for falls and improve dynamic standing balance.    Rehab Potential Fair   Clinical Impairments Affecting Rehab Potential This patient presents with 2 personal factors/ comorbidities lives alone and frequent falls,  and 3 body elements including body structures and functions, activity limitations and or participation restrictions including decreased strength, decreased balance and decreased gait. Patient's condition is evolving   PT Frequency 2x / week   PT Duration 8  weeks   PT Treatment/Interventions Aquatic Therapy;Cryotherapy;Electrical Stimulation;Functional mobility training;Stair training;Gait training;Ultrasound;Moist Heat;Therapeutic activities;Therapeutic exercise;Balance training;Neuromuscular re-education;Patient/family education   PT Next Visit Plan BERG balance and dynamic balance activities   PT Home Exercise Plan 3 way hip with red theraband   Consulted and Agree with Plan of Care Patient      Patient will benefit from skilled therapeutic intervention in order to improve the following deficits and impairments:  Abnormal gait, Decreased coordination, Difficulty walking, Pain, Decreased mobility, Decreased strength, Decreased balance, Decreased activity tolerance, Decreased endurance, Decreased safety awareness  Visit Diagnosis: Unsteady gait  Muscle weakness (generalized)     Problem List Patient Active Problem List   Diagnosis Date Noted  . Microcalcifications of the breast 12/15/2015  . Back pain, chronic 06/28/2015  . Chronic airway obstruction ([place]) 06/28/2015  . Panlobular emphysema ([place]) 09/10/2014  . HYPERLIPIDEMIA 06/21/2009  . HYPERTENSION 06/21/2009  . 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 FEVER UNSPECIFIED 06/21/2009  . NAUSEA 06/21/2009  . DYSPHAGIA 06/21/2009  . RUQ PAIN 06/21/2009  . HIATAL HERNIA 01/07/2008  . HYPOTHYROIDISM 11/22/2007  . ANXIETY 11/22/2007  . DEPRESSION 11/22/2007  . HYPERTENSIVE CARDIOVASCULAR DISEASE 11/22/2007  . 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 RENAL CALCULUS 11/22/2007  . DEGENERATIVE JOINT DISEASE 11/22/2007  . SLEEP APNEA 11/22/2007  . HEADACHE, CHRONIC 11/22/2007  . GASTRITIS 10/23/2007  . DIVERTICULOSIS, COLON 10/23/2007  .  Diabetes ([place]) 07/16/2007  . HYPERCHOLESTEROLEMIA 07/16/2007  . OBESITY, MODERATE 07/16/2007  . ISCHEMIC HEART DISEASE 07/16/2007  . ESOPHAGEAL REFLUX 07/16/2007  . SCLERODERMA 07/16/2007  . DYSPNEA 07/16/2007  . COUGH, CHRONIC 07/16/2007    This entire session was performed under direct supervision and direction of a licensed therapist/therapist assistant . I have personally read, edited and approve of the note as written. Betsy Coder, SPT Alanson Puls, [place], DPT [place], [place] S 09/06/2016, 3:24 PM  [place] MAIN [facility] SERVICES [address] [place], [place], [address] Phone: [phone]   Fax:  [phone]  Name: KHADIJA BUDZYNSKI MRN: [id number] Date of Birth: 1945/09/08

## 2016-09-07 ENCOUNTER — Encounter: Payer: PPO | Admitting: Physical Therapy

## 2016-09-11 ENCOUNTER — Encounter: Payer: PPO | Admitting: Physical Therapy

## 2016-09-12 ENCOUNTER — Encounter: Payer: Self-pay | Admitting: Physical Therapy

## 2016-09-12 ENCOUNTER — Ambulatory Visit: Payer: PPO | Attending: Neurology | Admitting: Physical Therapy

## 2016-09-12 DIAGNOSIS — R2681 Unsteadiness on feet: Secondary | ICD-10-CM

## 2016-09-12 DIAGNOSIS — M6281 Muscle weakness (generalized): Secondary | ICD-10-CM | POA: Insufficient documentation

## 2016-09-12 NOTE — Therapy (Signed)
[place] MAIN [facility] SERVICES [address] [place], [place], [address] Phone: [phone]   Fax:  [phone]  Physical Therapy Treatment  Patient Details  Name: Teresa [name] MRN: [id number] Date of Birth: 1946/06/27 Referring Provider: Anabel Bene   Encounter Date: 09/12/2016      PT End of Session - 09/12/16 0924    Visit Number 5   Number of Visits 17   Date for PT Re-Evaluation 10/23/16   Authorization Type 5/10   PT Start Time 0920   PT Stop Time 1000   PT Time Calculation (min) 40 min   Equipment Utilized During Treatment Gait belt   Activity Tolerance Patient tolerated treatment well;Patient limited by fatigue   Behavior During Therapy [facility] for tasks assessed/performed      Past Medical History:  Diagnosis Date  . Diabetes mellitus without complication ([place])   . GERD (gastroesophageal reflux disease)   . Gout   . Hypertension   . 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 Thyroid disease     Past Surgical History:  Procedure Laterality Date  . ABDOMINAL HYSTERECTOMY     ovaries intact  . BLADDER SURGERY     bladder tuck  . BREAST BIOPSY Right 03/20/2016   path pending x 2 area  . CARDIAC CATHETERIZATION    . CARPAL TUNNEL RELEASE    . DILATION AND CURETTAGE OF UTERUS    . EYE SURGERY     eyelid  . FRACTURE SURGERY     left ankle-plate and screws palced  . LITHOTRIPSY    . NISSEN FUNDOPLICATION    . SHOULDER SURGERY Right   . TONSILLECTOMY      There were no vitals filed for this visit.      Subjective Assessment - 09/12/16 [id number]    Subjective Patient states she is not doing very well today and she is very sore from moving all weekend. She hired movers to move the heavy stuff and she did most of the lighter stuff which required going up and down the stairs several times. She then had to clean the apartment the next day. She states she has fallen 6 times since last week.    Pertinent History Patient has been having  issue with falls within the last 5 years but she has no idea when it first started. Patient had shoulder repair surgery in 2016 and two weeks into the recovery had a fall which required another shoulder surgery to repair. This happened once again. She has hit her head, knees, and back during the falls. She is on BP medicine and take medicine regularly. Took BP the day before yesterday and it was 109/72. 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 She also had a back surgery in 2006, but they did not finish because she coded.  She states she has severe vertigo and that her doctor told her it's the worst case he has ever seen.   How long can you sit comfortably? not limited   How long can you stand comfortably? 15 minutes   How long can you walk comfortably? 30 minutes   Diagnostic tests none   Patient Stated Goals keep from falling   Currently in Pain? Yes   Pain Score 6    Pain Location Shoulder   Pain Orientation Right   Pain Descriptors / Indicators Burning;Dull  Pain Type Chronic pain   Pain Onset More than a month ago   Pain Frequency Constant   Multiple Pain Sites No       Therapeutic exercise:  NuStep 5 minutes 3 way hip with red theraband 15 Heel raises in // bars 10x2 Toe raises in // bars 10x2 Leg press 100 lbs 15x3 Mini squats in // bars 10x2   NMR:  Sit to stand training 10x2 Blue foam tandem stance RLE in front eyes closed x2 minutes Blue foam tandem stance LLE in front eyes closed x2 minutes Blue foam toe taps to 6 inch stool 15x2 Blue foam step ups to 6 inch stool 15x2 Blue foam side step ups BLE to 6 inch stool 15x2 each Rocker board fwd/bwd/side/side x2 minutes each    Patient required verbal and tactile cueing during tandem stance with eyes closed. Patient required CGA during all dynamic balance activities.         PT Education - 09/12/16 1102     Education provided Yes   Education Details Forward weight shifting to prevent loss of balance backwards   Person(s) Educated Patient   Methods Explanation;Demonstration   Comprehension Verbalized understanding             PT Long Term Goals - 08/28/16 1712      PT LONG TERM GOAL #1   Title Patient will be independent in home exercise program in order to increase BLE hip strength   Baseline 4/5   Time 8   Period Weeks   Status New     PT LONG TERM GOAL #2   Title Patient will decrease her 5 times sit to stand to less than 15 seconds in order to decrease risk of falls.   Baseline 17.65 seconds   Time 8   Period Weeks   Status New     PT LONG TERM GOAL #3   Title Patient will increase her single leg stance time to greater than 15 seconds without UE support in order to improve balance   Baseline 10 seconds with UE support   Time 8   Period Weeks   Status New     PT LONG TERM GOAL #4   Title Patient will increase her ABC Scale to 25% in order to decrease her fear of falling   Baseline 3.75%   Time 8   Period Weeks   Status New               Plan - 09/12/16 1103    Clinical Impression Statement Patient requires verbal and tactile cueing during tandem balance with eyes closed and required CGA during all dynamic standing balance activities. She reported fatigued more frequently today due to moving houses this weekend. Patient has also reported decreased falls in the past 5 days and is seeing improvement. Patient will continue to benefit from skilled therapy in order to decrease risk for falls and improve dynamic standing balance.   Rehab Potential Fair   Clinical Impairments Affecting Rehab Potential This patient presents with 2 personal factors/ comorbidities lives alone and frequent falls, and 3 body elements including body structures and functions, activity limitations and or participation restrictions including decreased strength, decreased balance and decreased  gait. Patient's condition is evolving   PT Frequency 2x / week   PT Duration 8 weeks   PT Treatment/Interventions Aquatic Therapy;Cryotherapy;Electrical Stimulation;Functional mobility training;Stair training;Gait training;Ultrasound;Moist Heat;Therapeutic activities;Therapeutic exercise;Balance training;Neuromuscular re-education;Patient/family education   PT Next Visit Plan BERG balance and dynamic balance activities  PT Home Exercise Plan 3 way hip with red theraband   Consulted and Agree with Plan of Care Patient      Patient will benefit from skilled therapeutic intervention in order to improve the following deficits and impairments:  Abnormal gait, Decreased coordination, Difficulty walking, Pain, Decreased mobility, Decreased strength, Decreased balance, Decreased activity tolerance, Decreased endurance, Decreased safety awareness  Visit Diagnosis: Unsteady gait  Muscle weakness (generalized)     Problem List Patient Active Problem List   Diagnosis Date Noted  . Microcalcifications of the breast 12/15/2015  . Back pain, chronic 06/28/2015  . Chronic airway obstruction ([place]) 06/28/2015  . Panlobular emphysema ([place]) 09/10/2014  . HYPERLIPIDEMIA 06/21/2009  . HYPERTENSION 06/21/2009  . 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 FEVER UNSPECIFIED 06/21/2009  . NAUSEA 06/21/2009  . DYSPHAGIA 06/21/2009  . RUQ PAIN 06/21/2009  . HIATAL HERNIA 01/07/2008  . HYPOTHYROIDISM 11/22/2007  . ANXIETY 11/22/2007  . DEPRESSION 11/22/2007  . HYPERTENSIVE CARDIOVASCULAR DISEASE 11/22/2007  . 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 RENAL CALCULUS 11/22/2007  . DEGENERATIVE JOINT DISEASE 11/22/2007  . SLEEP APNEA 11/22/2007  . HEADACHE, CHRONIC 11/22/2007  . GASTRITIS 10/23/2007  . DIVERTICULOSIS, COLON 10/23/2007  . Diabetes ([place]) 07/16/2007  . HYPERCHOLESTEROLEMIA 07/16/2007  . OBESITY, MODERATE 07/16/2007  . ISCHEMIC HEART DISEASE 07/16/2007  . ESOPHAGEAL REFLUX 07/16/2007  . SCLERODERMA  07/16/2007  . DYSPNEA 07/16/2007  . COUGH, CHRONIC 07/16/2007   Alanson Puls, PT, DPT This entire session was performed under direct supervision and direction of a licensed therapist/therapist assistant . I have personally read, edited and approve of the note as written. Betsy Coder, SPT [place] 09/12/2016, 11:06 AM  [place] MAIN [facility] SERVICES [address] [place], [place], [address] Phone: [phone]   Fax:  [phone]  Name: Teresa [name] MRN: [id number] Date of Birth: 05-29-1946

## 2016-09-13 ENCOUNTER — Encounter: Payer: PPO | Admitting: Physical Therapy

## 2016-09-14 ENCOUNTER — Ambulatory Visit: Payer: PPO | Admitting: Physical Therapy

## 2016-09-18 ENCOUNTER — Encounter: Payer: PPO | Admitting: Physical Therapy

## 2016-09-19 ENCOUNTER — Ambulatory Visit: Payer: PPO | Admitting: Physical Therapy

## 2016-09-19 ENCOUNTER — Encounter: Payer: Self-pay | Admitting: Physical Therapy

## 2016-09-19 DIAGNOSIS — R2681 Unsteadiness on feet: Secondary | ICD-10-CM | POA: Diagnosis not present

## 2016-09-19 DIAGNOSIS — M6281 Muscle weakness (generalized): Secondary | ICD-10-CM

## 2016-09-19 NOTE — Therapy (Signed)
[place] MAIN [facility] SERVICES [address] [place], [place], [address] Phone: [phone]   Fax:  [phone]  Physical Therapy Treatment  Patient Details  Name: Teresa [name] MRN: [id number] Date of Birth: [birth year] Referring Provider: Anabel Bene   Encounter Date: 09/19/2016      PT End of Session - 09/19/16 0924    Visit Number 6   Number of Visits 17   Date for PT Re-Evaluation 10/23/16   Authorization Type 6/10   PT Start Time 0915   PT Stop Time 0955   PT Time Calculation (min) 40 min   Equipment Utilized During Treatment Gait belt   Activity Tolerance Patient tolerated treatment well;Patient limited by fatigue   Behavior During Therapy [facility] for tasks assessed/performed      Past Medical History:  Diagnosis Date  . Diabetes mellitus without complication ([place])   . GERD (gastroesophageal reflux disease)   . Gout   . Hypertension   . 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 Thyroid disease     Past Surgical History:  Procedure Laterality Date  . ABDOMINAL HYSTERECTOMY     ovaries intact  . BLADDER SURGERY     bladder tuck  . BREAST BIOPSY Right 03/20/2016   path pending x 2 area  . CARDIAC CATHETERIZATION    . CARPAL TUNNEL RELEASE    . DILATION AND CURETTAGE OF UTERUS    . EYE SURGERY     eyelid  . FRACTURE SURGERY     left ankle-plate and screws palced  . LITHOTRIPSY    . NISSEN FUNDOPLICATION    . SHOULDER SURGERY Right   . TONSILLECTOMY      There were no vitals filed for this visit.      Subjective Assessment - 09/19/16 0922    Subjective Patient states she is not doing very well today . She had a case of vertigo last thrusday and still is having some dizziness. Her right shoulder is hurting her 7/ 10 today.    Pertinent History Patient has been having issue with falls within the last 5 years but she has no idea when it first started. Patient had shoulder repair surgery in 2016 and two weeks into the  recovery had a fall which required another shoulder surgery to repair. This happened once again. She has hit her head, knees, and back during the falls. She is on BP medicine and take medicine regularly. Took BP the day before yesterday and it was 109/72. 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 She also had a back surgery in 2006, but they did not finish because she coded.  She states she has severe vertigo and that her doctor told her it's the worst case he has ever seen.   How long can you sit comfortably? not limited   How long can you stand comfortably? 15 minutes   How long can you walk comfortably? 30 minutes   Diagnostic tests none   Patient Stated Goals keep from falling   Currently in Pain? Yes   Pain Score 7    Pain Location Shoulder   Pain Orientation Right   Pain Descriptors / Indicators Aching   Pain Type Chronic pain   Pain Onset More than a month ago   Pain Frequency Constant   Multiple Pain Sites No  Therapeutic exercise:  NuStep 5 minutes 3 way hip with red theraband 15 Heel raises in // bars 10x2 Leg press 100 lbs 15x3 Mini squats in // bars 10x2  NMR:  Sit to stand training 10x2 Feet together flat surface  eyes closed x2 minutes Feet together flat surface  tandem stance LLE in front  x2 minutes Blue foam toe taps to 6 inch stool 15x2 Blue foam step ups to 6 inch stool 15x2 Blue foam side step upsBLE to 6 inch stool 15x2 each Rocker board fwd/bwd/side/side x2 minutes each   Patient required verbal and tactile cueing during dynamic balance training. Patient required CGA during all dynamic balance activities.                            PT Education - 09/19/16 [phone]    Education provided Yes   Education Details use of hands with transfers   Person(s) Educated Patient   Methods Explanation   Comprehension Verbalized  understanding             PT Long Term Goals - 08/28/16 1712      PT LONG TERM GOAL #1   Title Patient will be independent in home exercise program in order to increase BLE hip strength   Baseline 4/5   Time 8   Period Weeks   Status New     PT LONG TERM GOAL #2   Title Patient will decrease her 5 times sit to stand to less than 15 seconds in order to decrease risk of falls.   Baseline 17.65 seconds   Time 8   Period Weeks   Status New     PT LONG TERM GOAL #3   Title Patient will increase her single leg stance time to greater than 15 seconds without UE support in order to improve balance   Baseline 10 seconds with UE support   Time 8   Period Weeks   Status New     PT LONG TERM GOAL #4   Title Patient will increase her ABC Scale to 25% in order to decrease her fear of falling   Baseline 3.75%   Time 8   Period Weeks   Status New               Plan - 09/19/16 [id number]    Clinical Impression Statement Continuous verbal cues and tactile cues needed to correct form with exercises and for posture correction. Patient is able to perform dynamic standing exercises and strengthening exericses wihout reports of pain. Patient does need CGA during dynamic standing balance training.Focused on improving static/dynamic balance while improving LE strength and patient demonstrated increased postural sway and required use of UE support to perform exercises indicating decreased balance. Patient will benefit from further skilled therapy to return to prior level of function.   Rehab Potential Fair   Clinical Impairments Affecting Rehab Potential This patient presents with 2 personal factors/ comorbidities lives alone and frequent falls, and 3 body elements including body structures and functions, activity limitations and or participation restrictions including decreased strength, decreased balance and decreased gait. Patient's condition is evolving   PT Frequency 2x / week   PT Duration  8 weeks   PT Treatment/Interventions Aquatic Therapy;Cryotherapy;Electrical Stimulation;Functional mobility training;Stair training;Gait training;Ultrasound;Moist Heat;Therapeutic activities;Therapeutic exercise;Balance training;Neuromuscular re-education;Patient/family education   PT Next Visit Plan BERG balance and dynamic balance activities   PT Home Exercise Plan 3 way hip with red theraband  Consulted and Agree with Plan of Care Patient      Patient will benefit from skilled therapeutic intervention in order to improve the following deficits and impairments:  Abnormal gait, Decreased coordination, Difficulty walking, Pain, Decreased mobility, Decreased strength, Decreased balance, Decreased activity tolerance, Decreased endurance, Decreased safety awareness  Visit Diagnosis: Unsteady gait  Muscle weakness (generalized)     Problem List Patient Active Problem List   Diagnosis Date Noted  . Microcalcifications of the breast 12/15/2015  . Back pain, chronic 06/28/2015  . Chronic airway obstruction ([place]) 06/28/2015  . Panlobular emphysema ([place]) 09/10/2014  . HYPERLIPIDEMIA 06/21/2009  . HYPERTENSION 06/21/2009  . 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 FEVER UNSPECIFIED 06/21/2009  . NAUSEA 06/21/2009  . DYSPHAGIA 06/21/2009  . RUQ PAIN 06/21/2009  . HIATAL HERNIA 01/07/2008  . HYPOTHYROIDISM 11/22/2007  . ANXIETY 11/22/2007  . DEPRESSION 11/22/2007  . HYPERTENSIVE CARDIOVASCULAR DISEASE 11/22/2007  . 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 RENAL CALCULUS 11/22/2007  . DEGENERATIVE JOINT DISEASE 11/22/2007  . SLEEP APNEA 11/22/2007  . HEADACHE, CHRONIC 11/22/2007  . GASTRITIS 10/23/2007  . DIVERTICULOSIS, COLON 10/23/2007  . Diabetes ([place]) 07/16/2007  . HYPERCHOLESTEROLEMIA 07/16/2007  . OBESITY, MODERATE 07/16/2007  . ISCHEMIC HEART DISEASE 07/16/2007  . ESOPHAGEAL REFLUX 07/16/2007  . SCLERODERMA 07/16/2007  . DYSPNEA 07/16/2007  . COUGH, CHRONIC 07/16/2007   Alanson Puls, PT, DPT [place], Minette Headland S 09/19/2016, 9:29 AM  [place] MAIN [facility] SERVICES [address] [place], [place], [address] Phone: [phone]   Fax:  [phone]  Name: Teresa [name] MRN: [id number] Date of Birth: 1946/01/26

## 2016-09-20 ENCOUNTER — Encounter: Payer: PPO | Admitting: Physical Therapy

## 2016-09-21 ENCOUNTER — Ambulatory Visit: Payer: PPO | Admitting: Physical Therapy

## 2016-09-21 ENCOUNTER — Encounter: Payer: Self-pay | Admitting: Physical Therapy

## 2016-09-21 DIAGNOSIS — M6281 Muscle weakness (generalized): Secondary | ICD-10-CM

## 2016-09-21 DIAGNOSIS — R2681 Unsteadiness on feet: Secondary | ICD-10-CM | POA: Diagnosis not present

## 2016-09-21 NOTE — Therapy (Signed)
[place] MAIN [facility] SERVICES [place], [place], [address] Phone: [phone]   Fax:  [phone]  Physical Therapy Treatment  Patient Details  Name: Teresa [name] MRN: [id number] Date of Birth: [birth year] Referring Provider: Anabel Bene   Encounter Date: 09/21/2016      PT End of Session - 09/21/16 0930    Visit Number 7   Number of Visits 17   Date for PT Re-Evaluation 10/23/16   Authorization Type 7/10   PT Start Time 0925   PT Stop Time 1010   PT Time Calculation (min) 45 min   Equipment Utilized During Treatment Gait belt   Activity Tolerance Patient tolerated treatment well;Patient limited by fatigue   Behavior During Therapy [facility] for tasks assessed/performed      Past Medical History:  Diagnosis Date  . Diabetes mellitus without complication ([place])   . GERD (gastroesophageal reflux disease)   . Gout   . Hypertension   . 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 Thyroid disease     Past Surgical History:  Procedure Laterality Date  . ABDOMINAL HYSTERECTOMY     ovaries intact  . BLADDER SURGERY     bladder tuck  . BREAST BIOPSY Right 03/20/2016   path pending x 2 area  . CARDIAC CATHETERIZATION    . CARPAL TUNNEL RELEASE    . DILATION AND CURETTAGE OF UTERUS    . EYE SURGERY     eyelid  . FRACTURE SURGERY     left ankle-plate and screws palced  . LITHOTRIPSY    . NISSEN FUNDOPLICATION    . SHOULDER SURGERY Right   . TONSILLECTOMY      There were no vitals filed for this visit.      Subjective Assessment - 09/21/16 0928    Subjective Patient states she is okay today, but her vertigo is still acting up. She states if her appointment was yesterday she would not have been able to come because her vertigo was so bad. Her right shoulder is 8/10 today. She states she has fallen 5 times since Monday, but is doing better with her sit to stands.   Pertinent History Patient has been having issue with falls within  the last 5 years but she has no idea when it first started. Patient had shoulder repair surgery in 2016 and two weeks into the recovery had a fall which required another shoulder surgery to repair. This happened once again. She has hit her head, knees, and back during the falls. She is on BP medicine and take medicine regularly. Took BP the day before yesterday and it was 109/72. 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 She also had a back surgery in 2006, but they did not finish because she coded.  She states she has severe vertigo and that her doctor told her it's the worst case he has ever seen.   How long can you sit comfortably? not limited   How long can you stand comfortably? 15 minutes   How long can you walk comfortably? 30 minutes   Diagnostic tests none   Patient Stated Goals keep from falling   Currently in Pain? Yes   Pain Score 8    Pain Location Shoulder   Pain Orientation Right   Pain Descriptors / Indicators Aching   Pain Type Chronic pain  Pain Onset More than a month ago   Pain Frequency Constant   Multiple Pain Sites No      Therapeutic exercise:  NuStep 5 minutes 3 way hip with red theraband 15x2 Heel raises in // bars 15x2 Toe raises in // bars 15x2 Leg press 100 lbs 15x3 Mini squats in // bars 10x2  NMR:  Sit to stand training 10x2 Bluefoam toe taps to 6 inch stool 15x2 Bluefoam step ups to 6 inch stool to blue foam fwd/bwd/side/side x15 each Side stepping in // bars x5 laps   Patient required CGA and verbaland tactilecueingduring dynamic balance training. Patient requiredUE support during fwd/bwd/side/side step ups.        PT Education - 09/21/16 0929    Education provided Yes   Education Details continuance of HEP   Person(s) Educated Patient   Methods Explanation   Comprehension Verbalized understanding             PT Long  Term Goals - 08/28/16 1712      PT LONG TERM GOAL #1   Title Patient will be independent in home exercise program in order to increase BLE hip strength   Baseline 4/5   Time 8   Period Weeks   Status New     PT LONG TERM GOAL #2   Title Patient will decrease her 5 times sit to stand to less than 15 seconds in order to decrease risk of falls.   Baseline 17.65 seconds   Time 8   Period Weeks   Status New     PT LONG TERM GOAL #3   Title Patient will increase her single leg stance time to greater than 15 seconds without UE support in order to improve balance   Baseline 10 seconds with UE support   Time 8   Period Weeks   Status New     PT LONG TERM GOAL #4   Title Patient will increase her ABC Scale to 25% in order to decrease her fear of falling   Baseline 3.75%   Time 8   Period Weeks   Status New               Plan - 09/21/16 1009    Clinical Impression Statement Patient required min verbal cueing during 3 way hip exercise to correct posture and form. Patient demonstrates ability to perform strengthening exercises and dynamic standing balance exercises with no increase in pain but with moderate fatigue. Patient will continue to benefit from continued skilled therapy in order to improve dynamic standing balance and increase strength in order to decrease falls.   Rehab Potential Fair   Clinical Impairments Affecting Rehab Potential This patient presents with 2 personal factors/ comorbidities lives alone and frequent falls, and 3 body elements including body structures and functions, activity limitations and or participation restrictions including decreased strength, decreased balance and decreased gait. Patient's condition is evolving   PT Frequency 2x / week   PT Duration 8 weeks   PT Treatment/Interventions Aquatic Therapy;Cryotherapy;Electrical Stimulation;Functional mobility training;Stair training;Gait training;Ultrasound;Moist Heat;Therapeutic activities;Therapeutic  exercise;Balance training;Neuromuscular re-education;Patient/family education   PT Next Visit Plan BERG balance and dynamic balance activities   PT Home Exercise Plan 3 way hip with red theraband   Consulted and Agree with Plan of Care Patient      Patient will benefit from skilled therapeutic intervention in order to improve the following deficits and impairments:  Abnormal gait, Decreased coordination, Difficulty walking, Pain, Decreased mobility, Decreased strength, Decreased balance, Decreased  activity tolerance, Decreased endurance, Decreased safety awareness  Visit Diagnosis: Unsteady gait  Muscle weakness (generalized)     Problem List Patient Active Problem List   Diagnosis Date Noted  . Microcalcifications of the breast 12/15/2015  . Back pain, chronic 06/28/2015  . Chronic airway obstruction ([place]) 06/28/2015  . Panlobular emphysema ([place]) 09/10/2014  . HYPERLIPIDEMIA 06/21/2009  . HYPERTENSION 06/21/2009  . 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 FEVER UNSPECIFIED 06/21/2009  . NAUSEA 06/21/2009  . DYSPHAGIA 06/21/2009  . RUQ PAIN 06/21/2009  . HIATAL HERNIA 01/07/2008  . HYPOTHYROIDISM 11/22/2007  . ANXIETY 11/22/2007  . DEPRESSION 11/22/2007  . HYPERTENSIVE CARDIOVASCULAR DISEASE 11/22/2007  . 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 RENAL CALCULUS 11/22/2007  . DEGENERATIVE JOINT DISEASE 11/22/2007  . SLEEP APNEA 11/22/2007  . HEADACHE, CHRONIC 11/22/2007  . GASTRITIS 10/23/2007  . DIVERTICULOSIS, COLON 10/23/2007  . Diabetes ([place]) 07/16/2007  . HYPERCHOLESTEROLEMIA 07/16/2007  . OBESITY, MODERATE 07/16/2007  . ISCHEMIC HEART DISEASE 07/16/2007  . ESOPHAGEAL REFLUX 07/16/2007  . SCLERODERMA 07/16/2007  . DYSPNEA 07/16/2007  . COUGH, CHRONIC 07/16/2007  This entire session was performed under direct supervision and direction of a licensed therapist/therapist assistant . I have personally read, edited and approve of the note as written. Betsy Coder, SPT [place] 09/21/2016, 10:12 AM  Alanson Puls, PT, DPT  [place] MAIN [facility] SERVICES [address] [place], [place], [address] Phone: [phone]   Fax:  [phone]  Name: Teresa [name] MRN: [id number] Date of Birth: 06-Jan-1946

## 2016-09-25 ENCOUNTER — Encounter: Payer: PPO | Admitting: Physical Therapy

## 2016-09-26 ENCOUNTER — Encounter: Payer: Self-pay | Admitting: Physical Therapy

## 2016-09-26 ENCOUNTER — Ambulatory Visit: Payer: PPO | Admitting: Physical Therapy

## 2016-09-26 ENCOUNTER — Encounter: Payer: Self-pay | Admitting: Family Medicine

## 2016-09-26 ENCOUNTER — Ambulatory Visit (INDEPENDENT_AMBULATORY_CARE_PROVIDER_SITE_OTHER): Payer: PPO | Admitting: Family Medicine

## 2016-09-26 VITALS — BP 98/50 | HR 80 | Temp 98.1°F | Resp 16 | Wt 194.0 lb

## 2016-09-26 DIAGNOSIS — R2681 Unsteadiness on feet: Secondary | ICD-10-CM

## 2016-09-26 DIAGNOSIS — I10 Essential (primary) hypertension: Secondary | ICD-10-CM | POA: Diagnosis not present

## 2016-09-26 DIAGNOSIS — E119 Type 2 diabetes mellitus without complications: Secondary | ICD-10-CM

## 2016-09-26 DIAGNOSIS — M6281 Muscle weakness (generalized): Secondary | ICD-10-CM

## 2016-09-26 MED ORDER — GLUCOSE BLOOD VI STRP: ORAL_STRIP | 12 refills | Status: DC

## 2016-09-26 MED ORDER — LOSARTAN POTASSIUM 50 MG PO TABS: 50.0000 mg | ORAL_TABLET | Freq: Every day | ORAL | 12 refills | Status: DC

## 2016-09-26 MED ORDER — FREESTYLE LIBRE READER DEVI: 1.0000 | Freq: Once | 0 refills | Status: AC

## 2016-09-26 NOTE — Therapy (Signed)
[place] MAIN [facility] SERVICES [address] [place], [place], [address] Phone: [phone]   Fax:  [phone]  Physical Therapy Treatment  Patient Details  Name: LATARSHIA CHRISTMAS MRN: [id number] Date of Birth: 1946/07/10 Referring Provider: Anabel Bene   Encounter Date: 09/26/2016      PT End of Session - 09/26/16 0921    Visit Number 8   Number of Visits 17   Date for PT Re-Evaluation 10/23/16   Authorization Type 8/10   PT Start Time 0920   PT Stop Time 1000   PT Time Calculation (min) 40 min   Equipment Utilized During Treatment Gait belt   Activity Tolerance Patient tolerated treatment well;Patient limited by fatigue   Behavior During Therapy [facility] for tasks assessed/performed      Past Medical History:  Diagnosis Date  . Diabetes mellitus without complication ([place])   . GERD (gastroesophageal reflux disease)   . Gout   . Hypertension   . 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 Thyroid disease     Past Surgical History:  Procedure Laterality Date  . ABDOMINAL HYSTERECTOMY     ovaries intact  . BLADDER SURGERY     bladder tuck  . BREAST BIOPSY Right 03/20/2016   path pending x 2 area  . CARDIAC CATHETERIZATION    . CARPAL TUNNEL RELEASE    . DILATION AND CURETTAGE OF UTERUS    . EYE SURGERY     eyelid  . FRACTURE SURGERY     left ankle-plate and screws palced  . LITHOTRIPSY    . NISSEN FUNDOPLICATION    . SHOULDER SURGERY Right   . TONSILLECTOMY      There were no vitals filed for this visit.      Subjective Assessment - 09/26/16 0923    Subjective Patient states she is okay today but is still continuing to fall. She states she has fallen 6 times since her last appointment and she believes her diabetic neuropathy is contributing to all of the falls. Her right shoulder pain is 5/10 today.   Pertinent History Patient has been having issue with falls within the last 5 years but she has no idea when it first started.  Patient had shoulder repair surgery in 2016 and two weeks into the recovery had a fall which required another shoulder surgery to repair. This happened once again. She has hit her head, knees, and back during the falls. She is on BP medicine and take medicine regularly. Took BP the day before yesterday and it was 109/72. 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 She also had a back surgery in 2006, but they did not finish because she coded.  She states she has severe vertigo and that her doctor told her it's the worst case he has ever seen.   How long can you sit comfortably? not limited   How long can you stand comfortably? 15 minutes   How long can you walk comfortably? 30 minutes   Diagnostic tests none   Patient Stated Goals keep from falling   Currently in Pain? Yes   Pain Score 5    Pain Location Shoulder   Pain Orientation Right   Pain Descriptors / Indicators Aching   Pain Type Chronic pain   Pain Onset More than a month ago   Multiple Pain Sites No  Therapeutic exercise:  NuStep 5 minutes 3 way hip with red theraband15x2 Heel raises in // bars 15x2 Toe raises in // bars 15x2 Mini squats in // bars 10x2 Side stepping in // bars with red theraband around ankles x10 laps Leg press 100 lbs 15x3   NMR:  Sit to stand training 10x2 CGA Sit to stand with purple foam under feet and 5 second hold 10x2 CGA Blue foam toe taps to 6 inch stool 15x2 CGA Matrix stepping 7.5 lbs fwd/bwd x3 each CGA Matrix stepping 7.5 lbs side/side x3 each CGA   Patient required consistent cueing to perform exercises slowly in order to achieve proper form and desired outcome of the exercise. Patient required CGA for all dynamic standing balance activity.         PT Education - 09/26/16 0925    Education provided Yes   Education Details plan of care   Person(s) Educated Patient   Methods  Explanation   Comprehension Verbalized understanding             PT Long Term Goals - 08/28/16 1712      PT LONG TERM GOAL #1   Title Patient will be independent in home exercise program in order to increase BLE hip strength   Baseline 4/5   Time 8   Period Weeks   Status New     PT LONG TERM GOAL #2   Title Patient will decrease her 5 times sit to stand to less than 15 seconds in order to decrease risk of falls.   Baseline 17.65 seconds   Time 8   Period Weeks   Status New     PT LONG TERM GOAL #3   Title Patient will increase her single leg stance time to greater than 15 seconds without UE support in order to improve balance   Baseline 10 seconds with UE support   Time 8   Period Weeks   Status New     PT LONG TERM GOAL #4   Title Patient will increase her ABC Scale to 25% in order to decrease her fear of falling   Baseline 3.75%   Time 8   Period Weeks   Status New               Plan - 09/26/16 1003    Clinical Impression Statement Patient required verbal and tactile cueing during sit to stand exercise with purple foam under her feet, and required continuous verbal cueing to perform exercises slowly and more controlled in order to achieve proper form and desired outcome of each specific exercise. Patient required CGA for all dynamic standing balance activities.    Rehab Potential Fair   Clinical Impairments Affecting Rehab Potential This patient presents with 2 personal factors/ comorbidities lives alone and frequent falls, and 3 body elements including body structures and functions, activity limitations and or participation restrictions including decreased strength, decreased balance and decreased gait. Patient's condition is evolving   PT Frequency 2x / week   PT Duration 8 weeks   PT Treatment/Interventions Aquatic Therapy;Cryotherapy;Electrical Stimulation;Functional mobility training;Stair training;Gait training;Ultrasound;Moist Heat;Therapeutic  activities;Therapeutic exercise;Balance training;Neuromuscular re-education;Patient/family education   PT Next Visit Plan BERG balance and dynamic balance activities   PT Home Exercise Plan 3 way hip with red theraband   Consulted and Agree with Plan of Care Patient      Patient will benefit from skilled therapeutic intervention in order to improve the following deficits and impairments:  Abnormal gait, Decreased coordination, Difficulty walking,  Pain, Decreased mobility, Decreased strength, Decreased balance, Decreased activity tolerance, Decreased endurance, Decreased safety awareness  Visit Diagnosis: Unsteady gait  Muscle weakness (generalized)     Problem List Patient Active Problem List   Diagnosis Date Noted  . Microcalcifications of the breast 12/15/2015  . Back pain, chronic 06/28/2015  . Chronic airway obstruction ([place]) 06/28/2015  . 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 HYPERLIPIDEMIA 06/21/2009  . HYPERTENSION 06/21/2009  . 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 FEVER UNSPECIFIED 06/21/2009  . NAUSEA 06/21/2009  . DYSPHAGIA 06/21/2009  . RUQ PAIN 06/21/2009  . HIATAL HERNIA 01/07/2008  . HYPOTHYROIDISM 11/22/2007  . ANXIETY 11/22/2007  . DEPRESSION 11/22/2007  . HYPERTENSIVE CARDIOVASCULAR DISEASE 11/22/2007  . 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 RENAL CALCULUS 11/22/2007  . DEGENERATIVE JOINT DISEASE 11/22/2007  . SLEEP APNEA 11/22/2007  . HEADACHE, CHRONIC 11/22/2007  . GASTRITIS 10/23/2007  . DIVERTICULOSIS, COLON 10/23/2007  . Diabetes ([place]) 07/16/2007  . HYPERCHOLESTEROLEMIA 07/16/2007  . OBESITY, MODERATE 07/16/2007  . ISCHEMIC HEART DISEASE 07/16/2007  . ESOPHAGEAL REFLUX 07/16/2007  . SCLERODERMA 07/16/2007  . DYSPNEA 07/16/2007  . COUGH, CHRONIC 07/16/2007  This entire session was performed under direct supervision and direction of a licensed therapist/therapist assistant . I have personally read, edited and approve of the note as written. Betsy Coder, SPT [place], [place], [place], DPT 09/26/2016, 1:41 PM  [place] MAIN [facility] SERVICES [address] [place], [place], [address] Phone: [phone]   Fax:  [phone]  Name: ARRIAN BRIDGER MRN: [id number] Date of Birth: 07-Jan-1946

## 2016-09-26 NOTE — Progress Notes (Signed)
Subjective:  HPI  Diabetes Mellitus Type II, Follow-up:   Lab Results  Component Value Date   HGBA1C 7.9 05/29/2016   HGBA1C 9.3 01/26/2016   HGBA1C 8.8 10/26/2015    Last seen for diabetes 4 months ago.  Management since then includes none. She reports good compliance with treatment. She is not having side effects.  Home blood sugar records: checks blood sugar twice a day and running anywhere from 125-200  Episodes of hypoglycemia? Yes. 1-2 since last OV. " I was so tired after therapy, I just went to bed"   Current Insulin Regimen: n/a Most Recent Eye Exam: 2017 Current exercise: PT for falls, and walking when the weather is good.   Pertinent Labs:    Component Value Date/Time   CHOL 142 07/26/2015 0905   CHOL 200 04/04/2013 1047   TRIG 183 (H) 07/26/2015 0905   TRIG 191 04/04/2013 1047   HDL 40 07/26/2015 0905   HDL 45 04/04/2013 1047   LDLCALC 65 07/26/2015 0905   LDLCALC 117 (H) 04/04/2013 1047   CREATININE 0.60 02/24/2016 1419   CREATININE 0.73 05/14/2014 0956    Wt Readings from Last 3 Encounters:  09/26/16 194 lb (88 kg)  05/29/16 195 lb (88.5 kg)  02/24/16 189 lb (85.7 kg)    ------------------------------------------------------------------------ Pt is due for other labs. She would also like to discuss getting the new meter that monitors you blood sugars on your arm. She is tired of sticking her fingers.    Prior to Admission medications   Medication Sig Start Date End Date Taking? Authorizing Provider  allopurinol (ZYLOPRIM) 100 MG tablet TAKE 1 TABLET BY MOUTH EVERY DAY 01/17/16   [name], MD  atorvastatin (LIPITOR) 10 MG tablet TAKE 1 TABLET BY MOUTH EVERY DAY AT BEDTIME 04/17/16   [name], MD  colchicine 0.6 MG tablet Take 0.6 mg by mouth daily.    Historical Provider, MD  empagliflozin (JARDIANCE) 25 MG TABS tablet Take 25 mg by mouth daily. 01/26/16   [name], MD  furosemide (LASIX) 40 MG tablet TAKE 1  TABLET BY MOUTH EVERY DAY 01/17/16   [name], MD  gabapentin (NEURONTIN) 400 MG capsule Take 1 capsule (400 mg total) by mouth 3 (three) times daily. 04/12/16   Richard Maceo Pro., MD  glimepiride (AMARYL) 4 MG tablet Take 1 tablet (4 mg total) by mouth 2 (two) times daily. 05/29/16   Richard Maceo Pro., MD  glucose blood (ACCU-CHEK COMPACT PLUS) test strip Check sugar twice daily DX: E11.9--NEED 90 DAY SUPPLY 09/02/15   Richard Maceo Pro., MD  levothyroxine (SYNTHROID, LEVOTHROID) 88 MCG tablet TAKE 1 TABLET BY MOUTH EVERY DAY 01/17/16   [name], MD  losartan-hydrochlorothiazide [facility]) 50-12.5 MG tablet TAKE 1 TABLET BY MOUTH EVERY DAY 01/17/16   [name], MD  meclizine (ANTIVERT) 25 MG tablet Take 1 tablet (25 mg total) by mouth 3 (three) times daily as needed for dizziness. 01/26/16   [name], MD  metFORMIN (GLUCOPHAGE) 1000 MG tablet TAKE 1 TABLET BY MOUTH TWICE A DAY 01/17/16   Richard Maceo Pro., MD  omeprazole (PRILOSEC) 20 MG capsule Take by mouth. 10/05/14   Historical Provider, MD  ondansetron (ZOFRAN-ODT) 4 MG disintegrating tablet Take 4 mg by mouth every 8 (eight) hours as needed for nausea or vomiting.    Historical Provider, MD  ONE TOUCH LANCETS [place] 1 each  by Does not apply route daily. 08/13/15   Richard Maceo Pro., MD  pioglitazone (ACTOS) 15 MG tablet Take 1 tablet (15 mg total) by mouth daily. 04/11/16   Richard Maceo Pro., MD    Patient Active Problem List   Diagnosis Date Noted  . Microcalcifications of the breast 12/15/2015  . Back pain, chronic 06/28/2015  . Chronic airway obstruction ([place]) 06/28/2015  . Panlobular emphysema ([place]) 09/10/2014  . HYPERLIPIDEMIA 06/21/2009  . HYPERTENSION 06/21/2009  . 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 FEVER UNSPECIFIED 06/21/2009  . NAUSEA 06/21/2009  . DYSPHAGIA 06/21/2009  . RUQ PAIN 06/21/2009  . HIATAL HERNIA 01/07/2008  . HYPOTHYROIDISM 11/22/2007  . ANXIETY  11/22/2007  . DEPRESSION 11/22/2007  . HYPERTENSIVE CARDIOVASCULAR DISEASE 11/22/2007  . 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 RENAL CALCULUS 11/22/2007  . DEGENERATIVE JOINT DISEASE 11/22/2007  . SLEEP APNEA 11/22/2007  . HEADACHE, CHRONIC 11/22/2007  . GASTRITIS 10/23/2007  . DIVERTICULOSIS, COLON 10/23/2007  . Diabetes ([place]) 07/16/2007  . HYPERCHOLESTEROLEMIA 07/16/2007  . OBESITY, MODERATE 07/16/2007  . ISCHEMIC HEART DISEASE 07/16/2007  . ESOPHAGEAL REFLUX 07/16/2007  . SCLERODERMA 07/16/2007  . DYSPNEA 07/16/2007  . COUGH, CHRONIC 07/16/2007    Past Medical History:  Diagnosis Date  . Diabetes mellitus without complication ([place])   . GERD (gastroesophageal reflux disease)   . Gout   . Hypertension   . 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 Thyroid disease     Social History   Social History  . Marital status: Divorced    Spouse name: N/A  . Number of children: N/A  . Years of education: N/A   Occupational History  . Not on file.   Social History Main Topics  . Smoking status: Never Smoker  . Smokeless tobacco: Never Used  . Alcohol use No  . Drug use: No  . Sexual activity: Not on file   Other Topics Concern  . Not on file   Social History Narrative  . No narrative on file    Allergies  Allergen Reactions  . Bacitracin-Neomycin-Polymyxin   . Clarithromycin Other (See Comments), Nausea Only and Nausea And Vomiting  . Codeine   . Dilaudid  [Hydromorphone Hcl] Nausea And Vomiting  . Hydromorphone Other (See Comments), Nausea Only and Nausea And Vomiting  . Iodine   . Iohexol      Desc: PT HAS HIVES/ITCHING   . Morphine   . Neomycin-Bacitracin Zn-Polymyx   . Oseltamivir Other (See Comments) and Nausea And Vomiting  . Tamiflu  [Oseltamivir Phosphate]     Other reaction(s): Abdominal Pain, Vomiting  . Zolpidem Other (See Comments)  . Zolpidem Tartrate   . Benzalkonium Chloride Itching, Rash and Swelling  . Lidocaine Hcl Itching, Rash and Swelling  . Tape Rash     Review of Systems  Constitutional: Positive for malaise/fatigue.  HENT: Negative.   Eyes: Negative.   Respiratory: Negative.   Cardiovascular: Negative.   Gastrointestinal: Negative.   Genitourinary: Negative.   Musculoskeletal: Negative.   Skin: Negative.   Neurological: Negative.   Endo/Heme/Allergies: Negative.   Psychiatric/Behavioral: Negative.     Immunization History  Administered Date(s) Administered  . Influenza, High Dose Seasonal PF 05/29/2016  . Pneumococcal Conjugate-13 05/21/2014  . Pneumococcal Polysaccharide-23 11/29/2004, 07/11/2010, 12/08/2015  . Td 07/05/1994  . Tdap 05/11/2011  . Zoster 05/07/2008    Objective:  BP (!) 98/50 (BP Location: Right Arm, Patient Position: Sitting, Cuff Size: Normal)   Pulse 80   Temp 98.1 F (  36.7 C) (Oral)   Resp 16   Wt 194 lb (88 kg)   BMI 35.48 kg/m   Physical Exam  Constitutional: She is oriented to person, place, and time and well-developed, well-nourished, and in no distress.  HENT:  Head: Normocephalic and atraumatic.  Right Ear: External ear normal.  Left Ear: External ear normal.  Nose: Nose normal.  Eyes: Conjunctivae are normal. Pupils are equal, round, and reactive to light.  Neck: No thyromegaly present.  Cardiovascular: Normal rate, regular rhythm and normal heart sounds.   Pulmonary/Chest: Effort normal and breath sounds normal.  Abdominal: Soft.  Musculoskeletal: She exhibits no edema.  Neurological: She is alert and oriented to person, place, and time. No cranial nerve deficit. Gait normal.  Skin: Skin is warm and dry.  Psychiatric: Mood, memory, affect and judgment normal.    Lab Results  Component Value Date   WBC 8.2 07/26/2015   HGB 13.8 05/14/2014   HCT 43.6 07/26/2015   PLT 252 07/26/2015   GLUCOSE 129 (H) 02/24/2016   CHOL 142 07/26/2015   TRIG 183 (H) 07/26/2015   HDL 40 07/26/2015   LDLCALC 65 07/26/2015   TSH 1.180 01/26/2016   HGBA1C 7.9 05/29/2016   MICROALBUR 20  12/08/2015    CMP     Component Value Date/Time   NA 140 02/24/2016 1419   NA 141 05/14/2014 0956   K 4.4 02/24/2016 1419   K 3.7 05/14/2014 0956   CL 97 02/24/2016 1419   CL 106 05/14/2014 0956   CO2 23 02/24/2016 1419   CO2 25 05/14/2014 0956   GLUCOSE 129 (H) 02/24/2016 1419   GLUCOSE 142 (H) 05/14/2014 0956   BUN 16 02/24/2016 1419   BUN 16 05/14/2014 0956   CREATININE 0.60 02/24/2016 1419   CREATININE 0.73 05/14/2014 0956   CALCIUM 9.7 02/24/2016 1419   CALCIUM 9.3 05/14/2014 0956   PROT 7.4 01/26/2016 0941   PROT 7.6 04/04/2013 1047   ALBUMIN 4.5 02/24/2016 1419   ALBUMIN 4.0 04/04/2013 1047   AST 25 01/26/2016 0941   AST 33 04/04/2013 1047   ALT 35 (H) 01/26/2016 0941   ALT 39 04/04/2013 1047   ALKPHOS 84 01/26/2016 0941   ALKPHOS 91 04/04/2013 1047   BILITOT 0.4 01/26/2016 0941   BILITOT 0.4 04/04/2013 1047   GFRNONAA 93 02/24/2016 1419   GFRNONAA >60 05/14/2014 0956   GFRNONAA >60 04/04/2013 1047   GFRAA 107 02/24/2016 1419   GFRAA >60 05/14/2014 0956   GFRAA >60 04/04/2013 1047    Assessment and Plan :   1. Type 2 diabetes mellitus without complication, without long-term current use of insulin (HCC) RTC 3 months. Pt wishes to switch to [facility] to check BS to avoid having to stick herself. I told her I am happy to send in but have no idea if it is covered. - POCT HgB A1C--8.2 today. - Continuous Blood Gluc Receiver (FREESTYLE LIBRE READER) DEVI; 1 Device by Does not apply route once.  Dispense: 1 Device; Refill: 0 - glucose blood test strip; Check blood sugar daily. Use as instructed  Dispense: 100 each; Refill: 12  2. Essential hypertension Pt hypotensive since adding Jardiance which can happen. Stop HCT  And use Losartan alone. Consider stopping Lasix which she says she takes for swelling but none is noted today. - losartan (COZAAR) 50 MG tablet; Take 1 tablet (50 mg total) by mouth daily.  Dispense: 30 tablet; Refill: 12 3. Diabetic  neuropathy Getting PT per Neurology  4.Obesity I have done the exam and reviewed the above chart and it is accurate to the best of my knowledge. Development worker, community has been used in this note in any air is in the dictation or transcription are unintentional.  [facility] 09/26/2016 2:33 PM

## 2016-09-27 ENCOUNTER — Encounter: Payer: PPO | Admitting: Physical Therapy

## 2016-09-28 ENCOUNTER — Other Ambulatory Visit: Payer: Self-pay

## 2016-09-28 MED ORDER — FREESTYLE LIBRE SENSOR SYSTEM MISC: 1.0000 | Freq: Every day | 12 refills | Status: DC

## 2016-10-02 ENCOUNTER — Encounter: Payer: PPO | Admitting: Physical Therapy

## 2016-10-02 ENCOUNTER — Ambulatory Visit: Payer: PPO | Admitting: Physical Therapy

## 2016-10-03 LAB — POCT GLYCOSYLATED HEMOGLOBIN (HGB A1C): HEMOGLOBIN A1C: 8.2

## 2016-10-04 ENCOUNTER — Encounter: Payer: PPO | Admitting: Physical Therapy

## 2016-10-05 ENCOUNTER — Ambulatory Visit: Payer: PPO | Attending: Neurology | Admitting: Physical Therapy

## 2016-10-05 ENCOUNTER — Encounter: Payer: Self-pay | Admitting: Physical Therapy

## 2016-10-05 ENCOUNTER — Telehealth: Payer: Self-pay | Admitting: Family Medicine

## 2016-10-05 DIAGNOSIS — R2681 Unsteadiness on feet: Secondary | ICD-10-CM | POA: Diagnosis not present

## 2016-10-05 DIAGNOSIS — M6281 Muscle weakness (generalized): Secondary | ICD-10-CM | POA: Diagnosis not present

## 2016-10-05 NOTE — Therapy (Addendum)
[place] MAIN [facility] SERVICES [address] [place], [place], [address] Phone: [phone]   Fax:  [phone]  Physical Therapy Treatment  Patient Details  Name: Teresa [name] MRN: [id number] Date of Birth: October 05, 1945 Referring Provider: Anabel Bene   Encounter Date: 10/05/2016      PT End of Session - 10/05/16 0932    Visit Number 9   Number of Visits 17   Date for PT Re-Evaluation 10/23/16   Authorization Type 9/10   PT Start Time 0915   PT Stop Time 1000   PT Time Calculation (min) 45 min   Equipment Utilized During Treatment Gait belt   Activity Tolerance Patient tolerated treatment well;Patient limited by fatigue   Behavior During Therapy [facility] for tasks assessed/performed      Past Medical History:  Diagnosis Date  . Diabetes mellitus without complication ([place])   . GERD (gastroesophageal reflux disease)   . Gout   . Hypertension   . 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 Thyroid disease     Past Surgical History:  Procedure Laterality Date  . ABDOMINAL HYSTERECTOMY     ovaries intact  . BLADDER SURGERY     bladder tuck  . BREAST BIOPSY Right 03/20/2016   path pending x 2 area  . CARDIAC CATHETERIZATION    . CARPAL TUNNEL RELEASE    . DILATION AND CURETTAGE OF UTERUS    . EYE SURGERY     eyelid  . FRACTURE SURGERY     left ankle-plate and screws palced  . LITHOTRIPSY    . NISSEN FUNDOPLICATION    . SHOULDER SURGERY Right   . TONSILLECTOMY      There were no vitals filed for this visit.      Subjective Assessment - 10/05/16 0931    Subjective Patient is feeling dizzy and she wonders if this is helping. No new complaints. Patient educated that her 10th visit is next time and her goals and outcome measures will be looked at and reviewed   Pertinent History Patient has been having issue with falls within the last 5 years but she has no idea when it first started. Patient had shoulder repair surgery in 2016 and two  weeks into the recovery had a fall which required another shoulder surgery to repair. This happened once again. She has hit her head, knees, and back during the falls. She is on BP medicine and take medicine regularly. Took BP the day before yesterday and it was 109/72. 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 She also had a back surgery in 2006, but they did not finish because she coded.  She states she has severe vertigo and that her doctor told her it's the worst case he has ever seen.   How long can you sit comfortably? not limited   How long can you stand comfortably? 15 minutes   How long can you walk comfortably? 30 minutes   Diagnostic tests none   Patient Stated Goals keep from falling   Currently in Pain? Yes   Pain Score 6    Pain Location Shoulder   Pain Orientation Right   Pain Descriptors / Indicators Aching   Pain Type Chronic pain   Pain Onset More than a month ago   Pain Frequency Constant      Neuromuscular training:   Tandem  stand with head turns x 2 minutes Side stepping with head control Stepping onto AIREX, then on to 4 inch step followed by stepping down onto AIREX and then level surface in //bars x15.  Pt required occasional UE assist, and performance improved with each repetition   Stepping over and back x10 bilaterally   Stepping over and back x`10 bilaterally with verbal cueing to perform exercise as fast as possible Side step and back x10 bilaterally Side step and back x10 bilaterally  Therapeutic exercise: Nu step L3 x 5 minutes for warm-up  ( unbilled); Quantum double leg press 10# x 15 x 3,; Heel raises with UE support 2 x 20; Mini squats  2 x 10; RTB side stepping in // bars 4 lengths x 2;                         PT Education - 10/05/16 0932    Education provided Yes   Education Details HEP   Person(s) Educated Patient    Methods Explanation   Comprehension Verbalized understanding             PT Long Term Goals - 10/05/16 0956      PT LONG TERM GOAL #1   Title Patient will be independent in home exercise program in order to increase BLE hip strength   Baseline 4/5   Time 8   Period Weeks   Status On-going     PT LONG TERM GOAL #2   Title Patient will decrease her 5 times sit to stand to less than 15 seconds in order to decrease risk of falls.   Time 8   Period Weeks   Status On-going     PT LONG TERM GOAL #3   Title Patient will increase her single leg stance time to greater than 15 seconds without UE support in order to improve balance   Time 8   Period Weeks   Status On-going     PT LONG TERM GOAL #4   Title Patient will increase her ABC Scale to 25% in order to decrease her fear of falling   Time 8   Period Weeks   Status On-going               Plan - 10/05/16 [id number]    Clinical Impression Statement Continuous verbal cues and tactile cues needed to correct form with exercises and for posture correction. Patient is able to perform dynamic standing exercises and strengthening exercises  with reports of feeling dizzy. . Patient does need CGA during dynamic standing balance training .Focused on improving static/dynamic balance while improving LE strength and patient demonstrated increased postural sway and required use of UE support to perform exercises indicating decreased balance. Patient will benefit from further skilled therapy to return to prior level of function.   Rehab Potential Fair   Clinical Impairments Affecting Rehab Potential This patient presents with 2 personal factors/ comorbidities lives alone and frequent falls, and 3 body elements including body structures and functions, activity limitations and or participation restrictions including decreased strength, decreased balance and decreased gait. Patient's condition is evolving   PT Frequency 2x / week   PT Duration 8  weeks   PT Treatment/Interventions Aquatic Therapy;Cryotherapy;Electrical Stimulation;Functional mobility training;Stair training;Gait training;Ultrasound;Moist Heat;Therapeutic activities;Therapeutic exercise;Balance training;Neuromuscular re-education;Patient/family education   PT Next Visit Plan BERG balance and dynamic balance activities   PT Home Exercise Plan 3 way hip with red theraband   Consulted and Agree with  Plan of Care Patient      Patient will benefit from skilled therapeutic intervention in order to improve the following deficits and impairments:  Abnormal gait, Decreased coordination, Difficulty walking, Pain, Decreased mobility, Decreased strength, Decreased balance, Decreased activity tolerance, Decreased endurance, Decreased safety awareness  Visit Diagnosis: Unsteady gait  Muscle weakness (generalized)     Problem List Patient Active Problem List   Diagnosis Date Noted  . Microcalcifications of the breast 12/15/2015  . Back pain, chronic 06/28/2015  . Chronic airway obstruction ([place]) 06/28/2015  . Panlobular emphysema ([place]) 09/10/2014  . HYPERLIPIDEMIA 06/21/2009  . HYPERTENSION 06/21/2009  . 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 FEVER UNSPECIFIED 06/21/2009  . NAUSEA 06/21/2009  . DYSPHAGIA 06/21/2009  . RUQ PAIN 06/21/2009  . HIATAL HERNIA 01/07/2008  . HYPOTHYROIDISM 11/22/2007  . ANXIETY 11/22/2007  . DEPRESSION 11/22/2007  . HYPERTENSIVE CARDIOVASCULAR DISEASE 11/22/2007  . 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 RENAL CALCULUS 11/22/2007  . DEGENERATIVE JOINT DISEASE 11/22/2007  . SLEEP APNEA 11/22/2007  . HEADACHE, CHRONIC 11/22/2007  . GASTRITIS 10/23/2007  . DIVERTICULOSIS, COLON 10/23/2007  . Diabetes ([place]) 07/16/2007  . HYPERCHOLESTEROLEMIA 07/16/2007  . OBESITY, MODERATE 07/16/2007  . ISCHEMIC HEART DISEASE 07/16/2007  . ESOPHAGEAL REFLUX 07/16/2007  . SCLERODERMA 07/16/2007  . DYSPNEA 07/16/2007  . COUGH, CHRONIC 07/16/2007   Alanson Puls, PT, DPT [place], Minette [place] S 10/05/2016, 10:01 AM  [place] MAIN [facility] SERVICES [address] [place], [place], [address] Phone: [phone]   Fax:  [phone]  Name: Teresa [name] MRN: [id number] Date of Birth: 1946-03-04

## 2016-10-09 ENCOUNTER — Ambulatory Visit (INDEPENDENT_AMBULATORY_CARE_PROVIDER_SITE_OTHER): Payer: PPO | Admitting: Podiatry

## 2016-10-09 ENCOUNTER — Encounter: Payer: Self-pay | Admitting: Podiatry

## 2016-10-09 DIAGNOSIS — E1149 Type 2 diabetes mellitus with other diabetic neurological complication: Secondary | ICD-10-CM

## 2016-10-09 DIAGNOSIS — B351 Tinea unguium: Secondary | ICD-10-CM | POA: Diagnosis not present

## 2016-10-09 DIAGNOSIS — M79676 Pain in unspecified toe(s): Secondary | ICD-10-CM | POA: Diagnosis not present

## 2016-10-09 NOTE — Progress Notes (Signed)
   Subjective:    Patient ID: Teresa [name], female    DOB: [birth year], 71 y.o.   MRN: [id number]  HPI this patient presents the office with chief complaint of long thick painful nails. She states she is unable to do her nails properly. She says her nails are painful walking and wearing her shoes. She says she has a history of diabetes and neuropathy and gout. She says she has actually been hospitalized due to her gout. She presents the office today for an evaluation and treatment of her    Review of Systems  All other systems reviewed and are negative.      Objective:   Physical Exam GENERAL APPEARANCE: Alert, conversant. Appropriately groomed. No acute distress.  VASCULAR: Pedal pulses are  palpable at  [place] and PT bilateral.  Capillary refill time is immediate to all digits,  Normal temperature gradient.  Digital hair growth is present bilateral  NEUROLOGIC: sensation is diminished  to 5.07 monofilament at 5/5 sites bilateral.  Light touch is intact bilateral, Muscle strength normal.  MUSCULOSKELETAL: acceptable muscle strength, tone and stability bilateral.  Intrinsic muscluature intact bilateral.  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  No preulcerative lesions or ulcers  are seen, no interdigital maceration noted.  No open lesions present.   No drainage noted.  NAILS  Thick disfigured discolored nails both feet.         Assessment & Plan:  Onychomycosis  Diabetes with neuropathy   IE  Debridement of nails  RTC 3 months   Gardiner Barefoot DPM

## 2016-10-09 NOTE — Telephone Encounter (Signed)
Pt requesting a new RX for a sensor for the [facility].  Pt request a 30 or 90 day sensor sent into [facility] in [place].

## 2016-10-10 ENCOUNTER — Other Ambulatory Visit: Payer: Self-pay

## 2016-10-10 ENCOUNTER — Encounter: Payer: Self-pay | Admitting: Physical Therapy

## 2016-10-10 ENCOUNTER — Ambulatory Visit: Payer: PPO | Admitting: Physical Therapy

## 2016-10-10 VITALS — BP 125/71 | HR 82

## 2016-10-10 DIAGNOSIS — R2681 Unsteadiness on feet: Secondary | ICD-10-CM

## 2016-10-10 DIAGNOSIS — Z6835 Body mass index (BMI) 35.0-35.9, adult: Secondary | ICD-10-CM | POA: Diagnosis not present

## 2016-10-10 DIAGNOSIS — E6609 Other obesity due to excess calories: Secondary | ICD-10-CM | POA: Diagnosis not present

## 2016-10-10 DIAGNOSIS — R296 Repeated falls: Secondary | ICD-10-CM | POA: Diagnosis not present

## 2016-10-10 DIAGNOSIS — M6281 Muscle weakness (generalized): Secondary | ICD-10-CM

## 2016-10-10 DIAGNOSIS — R42 Dizziness and giddiness: Secondary | ICD-10-CM | POA: Diagnosis not present

## 2016-10-10 NOTE — Therapy (Signed)
[place] MAIN [facility] SERVICES [address] [place], [place], [address] Phone: [phone]   Fax:  [phone]  Physical Therapy Treatment  Patient Details  Name: Teresa [name] MRN: [id number] Date of Birth: 06/25/1946 Referring Provider: Anabel Bene   Encounter Date: 10/10/2016      PT End of Session - 10/10/16 0901    Visit Number 10   Number of Visits 17   Date for PT Re-Evaluation 10/23/16   Authorization Type  g codes 10/10   PT Start Time 0904   PT Stop Time 0949   PT Time Calculation (min) 45 min   Equipment Utilized During Treatment Gait belt   Activity Tolerance Patient tolerated treatment well;Patient limited by fatigue   Behavior During Therapy [facility] for tasks assessed/performed      Past Medical History:  Diagnosis Date  . Diabetes mellitus without complication ([place])   . GERD (gastroesophageal reflux disease)   . Gout   . Hypertension   . 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 Thyroid disease     Past Surgical History:  Procedure Laterality Date  . ABDOMINAL HYSTERECTOMY     ovaries intact  . BLADDER SURGERY     bladder tuck  . BREAST BIOPSY Right 03/20/2016   path pending x 2 area  . CARDIAC CATHETERIZATION    . CARPAL TUNNEL RELEASE    . DILATION AND CURETTAGE OF UTERUS    . EYE SURGERY     eyelid  . FRACTURE SURGERY     left ankle-plate and screws palced  . LITHOTRIPSY    . NISSEN FUNDOPLICATION    . SHOULDER SURGERY Right   . TONSILLECTOMY      Vitals:   10/10/16 0908  BP: 125/71  Pulse: 82  SpO2: 96%        Subjective Assessment - 10/10/16 0908    Subjective Pt reports she fell 6x yesterday and feels that therapy is not helping much aside from helping her with a safe technique for sit>stand.  R shoulder is painful which is her baseline.  No other complaints or concerns at this time.   Pertinent History Patient has been having issue with falls within the last 5 years but she has no idea when it  first started. Patient had shoulder repair surgery in 11/08/2014 and two weeks into the recovery had a fall which required another shoulder surgery to repair. This happened once again. She has hit her head, knees, and back during the falls. She is on BP medicine and take medicine regularly. Took BP the day before yesterday and it was 109/72. 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 She also had a back surgery in 2004/11/07, but they did not finish because she coded.  She states she has severe vertigo and that her doctor told her it's the worst case he has ever seen.   How long can you sit comfortably? not limited   How long can you stand comfortably? 15 minutes   How long can you walk comfortably? 30 minutes   Diagnostic tests none   Patient Stated Goals keep from falling   Currently in Pain? Yes   Pain Score 8    Pain Location Shoulder   Pain Orientation Right   Pain Descriptors / Indicators Aching   Pain Type Chronic pain   Pain Onset More than  a month ago   Multiple Pain Sites No       TREATMENT   Hip strength with MMT (L,R):  F: 3/5, 3/5 (pain over anterior thigh with testing of L and R)  Abd: 4/5, 4/5  Add: 5/5, 5/5  ER: 3/5, 4/5  IR: 4/5, 4/5   Patient unable to move into supine, or sidelying for MMT due to vertigo so MMT completed in sitting   Outcome measures completed and results explained to the patient:  5xSTS: 15.53 seconds  SLS: 2 seconds RLE, 3 seconds LLE  ABC scale: 42%    Neuromuscular Training:  SLS L and R 2x20 seconds each side with up to minA to steady  Steps ups to 8" step from airex with up to minA to steady. Pt with LOB posteriorly with step down which improved following cues to activate core for improved trunk control.  Rhomberg stance on airex with lateral and ant/post perturbations with up to minA to steady.  Standing on airex and randomized tapping of  balance stones with L and then R LE each for 1 minute  Tandem walking on airex in // bars with up to minA to steady x8 lengths  Balancing on ant/post rockerboard with up to minA to steady 2x30 seconds             PT Education - 10/10/16 0901    Education provided Yes   Education Details Exercise technique; results of outcome measures   Person(s) Educated Patient   Methods Explanation;Demonstration;Verbal cues   Comprehension Verbalized understanding;Returned demonstration;Need further instruction             PT Long Term Goals - 10/10/16 0930      PT LONG TERM GOAL #1   Title Patient will be independent in home exercise program in order to increase BLE hip strength   Baseline 3/6: see Treatment note   Time 8   Period Weeks   Status On-going     PT LONG TERM GOAL #2   Title Patient will decrease her 5 times sit to stand to less than 15 seconds in order to decrease risk of falls.   Baseline 3/6: 15.53 seconds   Time 8   Period Weeks   Status On-going     PT LONG TERM GOAL #3   Title Patient will increase her single leg stance time to greater than 15 seconds without UE support in order to improve balance   Baseline 3/6: 2 seconds RLE, 3 seconds LLE   Time 8   Period Weeks   Status On-going     PT LONG TERM GOAL #4   Title Patient will increase her ABC Scale to 25% in order to decrease her fear of falling   Baseline 3/6: 42%   Time 8   Period Weeks   Status On-going               Plan - 10/10/16 0935    Clinical Impression Statement Pt demonstrates improvements with 5xSTS and reported improvement with transfers at home indicating the positive influence of PT.  Pt, however, continues to demonstrate instability with SLS and exercises designed to challenge pt's balance.  Pt will benefit from continued skilled PT interventions to continue to make improvements in balance, strength, and to decrease frequency of falls.    Rehab Potential Fair   Clinical  Impairments Affecting Rehab Potential This patient presents with 2 personal factors/ comorbidities lives alone and frequent falls, and 3 body elements including body  structures and functions, activity limitations and or participation restrictions including decreased strength, decreased balance and decreased gait. Patient's condition is evolving   PT Frequency 2x / week   PT Duration 8 weeks   PT Treatment/Interventions Aquatic Therapy;Cryotherapy;Electrical Stimulation;Functional mobility training;Stair training;Gait training;Ultrasound;Moist Heat;Therapeutic activities;Therapeutic exercise;Balance training;Neuromuscular re-education;Patient/family education   PT Next Visit Plan BERG balance and dynamic balance activities   PT Home Exercise Plan 3 way hip with red theraband   Consulted and Agree with Plan of Care Patient      Patient will benefit from skilled therapeutic intervention in order to improve the following deficits and impairments:  Abnormal gait, Decreased coordination, Difficulty walking, Pain, Decreased mobility, Decreased strength, Decreased balance, Decreased activity tolerance, Decreased endurance, Decreased safety awareness  Visit Diagnosis: Unsteady gait  Muscle weakness (generalized)       G-Codes - 10/15/2016 0921    Functional Assessment Tool Used (Outpatient Only) 5xSTS, SLS, ABC scale, Clinical Judgement   Functional Limitation Mobility: Walking and moving around   Mobility: Walking and Moving Around Current Status [id number]) At least 40 percent but less than 60 percent impaired, limited or restricted   Mobility: Walking and Moving Around Goal Status [phone]) At least 20 percent but less than 40 percent impaired, limited or restricted      Problem List Patient Active Problem List   Diagnosis Date Noted  . Microcalcifications of the breast 12/15/2015  . Back pain, chronic 06/28/2015  . Chronic airway obstruction ([place]) 06/28/2015  . Panlobular emphysema ([place])  09/10/2014  . HYPERLIPIDEMIA 06/21/2009  . HYPERTENSION 06/21/2009  . 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 FEVER UNSPECIFIED 06/21/2009  . NAUSEA 06/21/2009  . DYSPHAGIA 06/21/2009  . RUQ PAIN 06/21/2009  . HIATAL HERNIA 01/07/2008  . HYPOTHYROIDISM 11/22/2007  . ANXIETY 11/22/2007  . DEPRESSION 11/22/2007  . HYPERTENSIVE CARDIOVASCULAR DISEASE 11/22/2007  . 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 RENAL CALCULUS 11/22/2007  . DEGENERATIVE JOINT DISEASE 11/22/2007  . SLEEP APNEA 11/22/2007  . HEADACHE, CHRONIC 11/22/2007  . GASTRITIS 10/23/2007  . DIVERTICULOSIS, COLON 10/23/2007  . Diabetes ([place]) 07/16/2007  . HYPERCHOLESTEROLEMIA 07/16/2007  . OBESITY, MODERATE 07/16/2007  . ISCHEMIC HEART DISEASE 07/16/2007  . ESOPHAGEAL REFLUX 07/16/2007  . SCLERODERMA 07/16/2007  . DYSPNEA 07/16/2007  . COUGH, CHRONIC 07/16/2007    Collie Siad PT, DPT 10/15/16, 9:51 AM  [place] MAIN [facility] SERVICES [address] [place], [place], [address] Phone: [phone]   Fax:  [phone]  Name: RAYETTE MALLAS MRN: [id number] Date of Birth: 1945-09-23

## 2016-10-10 NOTE — Telephone Encounter (Signed)
I have left a message for the pharmacy authorizing the patient to have test strips, lancets or a new device.  I am not sure by this message what she is actually asking.   LMTCB for the patient also. ED

## 2016-10-12 ENCOUNTER — Encounter: Payer: Self-pay | Admitting: Physical Therapy

## 2016-10-12 ENCOUNTER — Ambulatory Visit: Payer: PPO | Admitting: Physical Therapy

## 2016-10-12 VITALS — BP 143/81 | HR 72

## 2016-10-12 DIAGNOSIS — R2681 Unsteadiness on feet: Secondary | ICD-10-CM

## 2016-10-12 DIAGNOSIS — M6281 Muscle weakness (generalized): Secondary | ICD-10-CM

## 2016-10-12 NOTE — Patient Instructions (Addendum)
ABDUCTION: Standing - Resistance Band (Active)    Stand beside a countertop or very secure piece of furniture in case you were to lose your balance.  Stand, feet flat. Against red resistance band, lift right leg out to side. Then complete with your left leg. Complete 2 sets of 10 repetitions. Perform 2 sessions per day.  http://gtsc.exer.us/117    Copyright  VHI. All rights reserved.  Balance, Proprioception: Hip Flexion With Tubing    Stand beside a countertop or very secure piece of furniture in case you were to lose your balance.  With red resistance band around your R ankle bring your R foot forward.   Complete 2 sets of 10 repetitions. Do 2 sessions per day.  Do the same with your L leg.    http://cc.exer.us/18   Copyright  VHI. All rights reserved.  Hip Extension, Knee Straight    Stand beside a countertop or very secure piece of furniture in case you were to lose your balance.  With red resistance band around your ankle move right leg straight back. Complete 2 sets of 10 repetitions. Do 2 sessions per day.  Complete on L side as well.  Copyright  VHI. All rights reserved.  POSITION: Single Leg Balance    Stand in corner of room with your back to the corner of the room and a sturdy chair in front of you.  This is in case you lose your balance. Stand on right leg on a firm surface and balance for 30 seconds.  Then stand on left leg on pillow and balance for 30 seconds.  If you begin to feel dizzy at any point immediately discontinue exercise and sit down. 3 reps 2 times per day.  http://ggbe.exer.us/19   Copyright  VHI. All rights reserved.

## 2016-10-12 NOTE — Therapy (Signed)
[place] MAIN [facility] SERVICES [address] [place], [place], [address] Phone: [phone]   Fax:  [phone]  Physical Therapy Treatment  Patient Details  Name: Teresa [name] MRN: [id number] Date of Birth: 1946/01/02 Referring Provider: Anabel Bene   Encounter Date: 10/12/2016      PT End of Session - 10/12/16 0937    Visit Number 11   Number of Visits 17   Date for PT Re-Evaluation 10/23/16   Authorization Type  g codes 1/10   PT Start Time 0931   PT Stop Time 1013   PT Time Calculation (min) 42 min   Equipment Utilized During Treatment Gait belt   Activity Tolerance Patient tolerated treatment well;Patient limited by fatigue   Behavior During Therapy [facility] for tasks assessed/performed      Past Medical History:  Diagnosis Date  . Diabetes mellitus without complication ([place])   . GERD (gastroesophageal reflux disease)   . Gout   . Hypertension   . 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 Thyroid disease     Past Surgical History:  Procedure Laterality Date  . ABDOMINAL HYSTERECTOMY     ovaries intact  . BLADDER SURGERY     bladder tuck  . BREAST BIOPSY Right 03/20/2016   path pending x 2 area  . CARDIAC CATHETERIZATION    . CARPAL TUNNEL RELEASE    . DILATION AND CURETTAGE OF UTERUS    . EYE SURGERY     eyelid  . FRACTURE SURGERY     left ankle-plate and screws palced  . LITHOTRIPSY    . NISSEN FUNDOPLICATION    . SHOULDER SURGERY Right   . TONSILLECTOMY      Vitals:   10/12/16 0935  BP: (!) 143/81  Pulse: 72  SpO2: 96%        Subjective Assessment - 10/12/16 0935    Subjective Pt reports today will be her last day with therapy.  She saw her Nurse Practitioner yesterday (Dr. Lannie Fields office) who suggested pt d/c PT as she does not appear to be making significant gains.  Pt reports the NP suggested going through a Parkinson's workup and to go from there.     Pertinent History Patient has been having issue with  falls within the last 5 years but she has no idea when it first started. Patient had shoulder repair surgery in 2016 and two weeks into the recovery had a fall which required another shoulder surgery to repair. This happened once again. She has hit her head, knees, and back during the falls. She is on BP medicine and take medicine regularly. Took BP the day before yesterday and it was 109/72. 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 She also had a back surgery in 2006, but they did not finish because she coded.  She states she has severe vertigo and that her doctor told her it's the worst case he has ever seen.   How long can you sit comfortably? not limited   How long can you stand comfortably? 15 minutes   How long can you walk comfortably? 30 minutes   Diagnostic tests none   Patient Stated Goals keep from falling   Currently in Pain? Yes   Pain Score 5    Pain Location Shoulder   Pain Orientation Right   Pain Descriptors / Indicators Aching  Pain Type Chronic pain   Pain Onset More than a month ago        TREATMENT   Therapeutic Exercise:  Reviewed HEP: Standing hip 3 way (Abd, E, F) with RTB around ankles 2x10 each direction  Sideways walking with RTB just above ankles x8 lengths in // bars. Very challenging for pt and fatigue noted. Seated rest break following.  Sit<>stand 2x10 with seated rest break between each set.  Stepping up and over Bosu ball with 2 UEs supported on // bars. x10 each direction with cues for core activation.  Matrix walking forward and then backward with 7.5 lbs x3 each direction   Neuromuscular Training:  SLS L and R 2x20 seconds each side in corner of room with steady chair in front of pt. (added to HEP)  Steps ups to 8" step from airex. 2x20. Pt did not lose her balance posteriorly with this today.              PT Education - 10/12/16 0936     Education provided Yes   Education Details Reviewed HEP and added exercises to HEP   Person(s) Educated Patient   Methods Handout;Explanation;Demonstration   Comprehension Verbalized understanding;Returned demonstration;Need further instruction             PT Long Term Goals - 10/10/16 0930      PT LONG TERM GOAL #1   Title Patient will be independent in home exercise program in order to increase BLE hip strength   Baseline 3/6: see Treatment note   Time 8   Period Weeks   Status On-going     PT LONG TERM GOAL #2   Title Patient will decrease her 5 times sit to stand to less than 15 seconds in order to decrease risk of falls.   Baseline 3/6: 15.53 seconds   Time 8   Period Weeks   Status On-going     PT LONG TERM GOAL #3   Title Patient will increase her single leg stance time to greater than 15 seconds without UE support in order to improve balance   Baseline 3/6: 2 seconds RLE, 3 seconds LLE   Time 8   Period Weeks   Status On-going     PT LONG TERM GOAL #4   Title Patient will increase her ABC Scale to 25% in order to decrease her fear of falling   Baseline 3/6: 42%   Time 8   Period Weeks   Status On-going               Plan - 10/12/16 1013    Clinical Impression Statement Reviewed current HEP (Hip 3 way) and added SLS BLE to HEP and pt was provided with handout.  She tolerated all interventions well this date.  She will benefit from continued skilled PT interventions for improved balance, strength, and QOL.   Rehab Potential Fair   Clinical Impairments Affecting Rehab Potential This patient presents with 2 personal factors/ comorbidities lives alone and frequent falls, and 3 body elements including body structures and functions, activity limitations and or participation restrictions including decreased strength, decreased balance and decreased gait. Patient's condition is evolving   PT Frequency 2x / week   PT Duration 8 weeks   PT  Treatment/Interventions Aquatic Therapy;Cryotherapy;Electrical Stimulation;Functional mobility training;Stair training;Gait training;Ultrasound;Moist Heat;Therapeutic activities;Therapeutic exercise;Balance training;Neuromuscular re-education;Patient/family education   PT Next Visit Plan BERG balance and dynamic balance activities   PT Home Exercise Plan 3 way hip with red theraband  Consulted and Agree with Plan of Care Patient      Patient will benefit from skilled therapeutic intervention in order to improve the following deficits and impairments:  Abnormal gait, Decreased coordination, Difficulty walking, Pain, Decreased mobility, Decreased strength, Decreased balance, Decreased activity tolerance, Decreased endurance, Decreased safety awareness  Visit Diagnosis: Unsteady gait  Muscle weakness (generalized)     Problem List Patient Active Problem List   Diagnosis Date Noted  . Microcalcifications of the breast 12/15/2015  . Back pain, chronic 06/28/2015  . Chronic airway obstruction ([place]) 06/28/2015  . Panlobular emphysema ([place]) 09/10/2014  . HYPERLIPIDEMIA 06/21/2009  . HYPERTENSION 06/21/2009  . 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 FEVER UNSPECIFIED 06/21/2009  . NAUSEA 06/21/2009  . DYSPHAGIA 06/21/2009  . RUQ PAIN 06/21/2009  . HIATAL HERNIA 01/07/2008  . HYPOTHYROIDISM 11/22/2007  . ANXIETY 11/22/2007  . DEPRESSION 11/22/2007  . HYPERTENSIVE CARDIOVASCULAR DISEASE 11/22/2007  . 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 RENAL CALCULUS 11/22/2007  . DEGENERATIVE JOINT DISEASE 11/22/2007  . SLEEP APNEA 11/22/2007  . HEADACHE, CHRONIC 11/22/2007  . GASTRITIS 10/23/2007  . DIVERTICULOSIS, COLON 10/23/2007  . Diabetes ([place]) 07/16/2007  . HYPERCHOLESTEROLEMIA 07/16/2007  . OBESITY, MODERATE 07/16/2007  . ISCHEMIC HEART DISEASE 07/16/2007  . ESOPHAGEAL REFLUX 07/16/2007  . SCLERODERMA 07/16/2007  . DYSPNEA 07/16/2007  . COUGH, CHRONIC 07/16/2007    Collie Siad PT,  DPT 10/12/2016, 10:17 AM  [place] MAIN [facility] SERVICES [address] [place], [place], [address] Phone: [phone]   Fax:  [phone]  Name: Teresa [name] MRN: [id number] Date of Birth: 22-Sep-1945

## 2016-10-18 ENCOUNTER — Encounter: Payer: PPO | Admitting: Physical Therapy

## 2016-10-23 ENCOUNTER — Encounter: Payer: PPO | Admitting: Physical Therapy

## 2016-10-25 ENCOUNTER — Encounter: Payer: PPO | Admitting: Physical Therapy

## 2016-10-30 ENCOUNTER — Encounter: Payer: PPO | Admitting: Physical Therapy

## 2016-10-31 ENCOUNTER — Other Ambulatory Visit: Payer: Self-pay | Admitting: Nurse Practitioner

## 2016-10-31 DIAGNOSIS — R296 Repeated falls: Secondary | ICD-10-CM

## 2016-10-31 DIAGNOSIS — R2681 Unsteadiness on feet: Secondary | ICD-10-CM

## 2016-11-01 ENCOUNTER — Encounter: Payer: PPO | Admitting: Physical Therapy

## 2016-11-05 ENCOUNTER — Other Ambulatory Visit: Payer: Self-pay | Admitting: Family Medicine

## 2016-11-13 ENCOUNTER — Other Ambulatory Visit: Payer: Self-pay | Admitting: Nurse Practitioner

## 2016-11-13 ENCOUNTER — Ambulatory Visit
Admission: RE | Admit: 2016-11-13 | Discharge: 2016-11-13 | Disposition: A | Discharge status: Home / Self Care | Payer: PPO | Source: Ambulatory Visit | Attending: Nurse Practitioner | Admitting: Nurse Practitioner

## 2016-11-13 DIAGNOSIS — G936 Cerebral edema: Secondary | ICD-10-CM | POA: Diagnosis not present

## 2016-11-13 DIAGNOSIS — R600 Localized edema: Secondary | ICD-10-CM | POA: Diagnosis not present

## 2016-11-13 DIAGNOSIS — R296 Repeated falls: Secondary | ICD-10-CM

## 2016-11-13 DIAGNOSIS — R2681 Unsteadiness on feet: Secondary | ICD-10-CM | POA: Diagnosis not present

## 2016-11-13 DIAGNOSIS — I6782 Cerebral ischemia: Secondary | ICD-10-CM | POA: Insufficient documentation

## 2016-11-29 ENCOUNTER — Encounter: Payer: Self-pay | Admitting: Physical Therapy

## 2016-11-29 DIAGNOSIS — M6281 Muscle weakness (generalized): Secondary | ICD-10-CM

## 2016-11-29 DIAGNOSIS — R2681 Unsteadiness on feet: Secondary | ICD-10-CM

## 2016-11-29 NOTE — Therapy (Signed)
[place] MAIN [facility] SERVICES [address] [place], [place], [address] Phone: [phone]   Fax:  [phone]  November 29, 2016   '[email or web address]' @  Physical Therapy Discharge Summary  Patient: Teresa [name]  MRN: [id number]  Date of Birth: 10-11-1945   Diagnosis: Unsteady gait  Muscle weakness (generalized) Referring Provider: potter zachery  The above patient had been seen in Physical Therapy 11 times of 17 treatments scheduled.  The treatment consisted of balance training and strengthening. The patient is: Improved  Subjective: Patient called and cancelled the remaining appointments.  Discharge Findings: unable to get findings due to patient not attending her last appointment.     Goals Partially Met    Sincerely,   Reylene Stauder, Sherryl Barters, PT, DPT   CC [email or web address]' @  [place] [address] [place], [place], [address] Phone: [phone]   Fax:  [phone]  Patient: Teresa [name]  MRN: [id number]  Date of Birth: Sep 08, 1945

## 2016-12-11 DIAGNOSIS — G63 Polyneuropathy in diseases classified elsewhere: Secondary | ICD-10-CM | POA: Diagnosis not present

## 2016-12-11 DIAGNOSIS — E6609 Other obesity due to excess calories: Secondary | ICD-10-CM | POA: Diagnosis not present

## 2016-12-11 DIAGNOSIS — R2689 Other abnormalities of gait and mobility: Secondary | ICD-10-CM | POA: Diagnosis not present

## 2016-12-11 DIAGNOSIS — R296 Repeated falls: Secondary | ICD-10-CM | POA: Diagnosis not present

## 2016-12-11 DIAGNOSIS — Z6835 Body mass index (BMI) 35.0-35.9, adult: Secondary | ICD-10-CM | POA: Diagnosis not present

## 2016-12-11 DIAGNOSIS — R42 Dizziness and giddiness: Secondary | ICD-10-CM | POA: Diagnosis not present

## 2017-01-03 ENCOUNTER — Other Ambulatory Visit: Payer: Self-pay | Admitting: Family Medicine

## 2017-01-15 ENCOUNTER — Ambulatory Visit (INDEPENDENT_AMBULATORY_CARE_PROVIDER_SITE_OTHER): Payer: PPO | Admitting: Podiatry

## 2017-01-15 DIAGNOSIS — B351 Tinea unguium: Secondary | ICD-10-CM

## 2017-01-15 DIAGNOSIS — M2020 Hallux rigidus, unspecified foot: Secondary | ICD-10-CM

## 2017-01-15 DIAGNOSIS — E1149 Type 2 diabetes mellitus with other diabetic neurological complication: Secondary | ICD-10-CM

## 2017-01-15 DIAGNOSIS — M79676 Pain in unspecified toe(s): Secondary | ICD-10-CM | POA: Diagnosis not present

## 2017-01-15 DIAGNOSIS — M205X9 Other deformities of toe(s) (acquired), unspecified foot: Secondary | ICD-10-CM

## 2017-01-15 NOTE — Progress Notes (Signed)
   Subjective:    Patient ID: Teresa [name], female    DOB: 03/12/1946, 71 y.o.   MRN: [id number]  HPI this patient presents the office with chief complaint of long thick painful nails. She states she is unable to do her nails properly. She says her nails are painful walking and wearing her shoes.  She presents the office today for an evaluation and treatment of her    Review of Systems  All other systems reviewed and are negative.      Objective:   Physical Exam GENERAL APPEARANCE: Alert, conversant. Appropriately groomed. No acute distress.  VASCULAR: Pedal pulses are  palpable at  [facility] and PT bilateral.  Capillary refill time is immediate to all digits,  Normal temperature gradient.  Digital hair growth is present bilateral  NEUROLOGIC: sensation is diminished  to 5.07 monofilament at 5/5 sites bilateral.  Light touch is intact bilateral, Muscle strength normal.  MUSCULOSKELETAL: acceptable muscle strength, tone and stability bilateral.  Intrinsic muscluature intact bilateral.  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  No preulcerative lesions or ulcers  are seen, no interdigital maceration noted.  No open lesions present.   No drainage noted.  NAILS  Thick disfigured discolored nails both feet.         Assessment & Plan:  Onychomycosis  Diabetes with neuropathy  Hallux limitus 1st MPJ  B/L   IE  Debridement of nails  RTC 3 months.  Patient to been by [facility] for diabetic shoes and an evaluation for frequent falls.     Gardiner Barefoot DPM

## 2017-01-17 ENCOUNTER — Ambulatory Visit (INDEPENDENT_AMBULATORY_CARE_PROVIDER_SITE_OTHER): Payer: Self-pay | Admitting: Podiatry

## 2017-01-17 DIAGNOSIS — E1149 Type 2 diabetes mellitus with other diabetic neurological complication: Secondary | ICD-10-CM

## 2017-01-17 DIAGNOSIS — M2020 Hallux rigidus, unspecified foot: Secondary | ICD-10-CM

## 2017-01-17 DIAGNOSIS — M205X9 Other deformities of toe(s) (acquired), unspecified foot: Secondary | ICD-10-CM

## 2017-01-17 NOTE — Progress Notes (Signed)
Diabetic Foot Form - Detailed   No data filed     Patient came in today for diabetic foot assessment, measurement, and to choose diabetic shoes...  Patient chose apex [id number]

## 2017-01-19 ENCOUNTER — Ambulatory Visit (INDEPENDENT_AMBULATORY_CARE_PROVIDER_SITE_OTHER): Payer: PPO

## 2017-01-19 VITALS — BP 122/60 | HR 68 | Temp 98.5°F | Ht 62.0 in | Wt 196.2 lb

## 2017-01-19 DIAGNOSIS — Z0000 Encounter for general adult medical examination without abnormal findings: Secondary | ICD-10-CM | POA: Diagnosis not present

## 2017-01-19 NOTE — Progress Notes (Signed)
Subjective:   Teresa [name] is a 71 y.o. female who presents for Medicare Annual (Subsequent) preventive examination.  Review of Systems:  N/A  Cardiac Risk Factors include: advanced age (>87men, >22 women);diabetes mellitus;dyslipidemia;hypertension;obesity (BMI >30kg/m2)     Objective:     Vitals: BP 122/60 (BP Location: Right Arm)   Pulse 68   Temp 98.5 F (36.9 C) (Oral)   Ht 5\' 2"  (1.575 m)   Wt 196 lb 3.2 oz (89 kg)   BMI 35.89 kg/m   Body mass index is 35.89 kg/m.   Tobacco History  Smoking Status  . Never Smoker  Smokeless Tobacco  . Never Used     Counseling given: Not Answered   Past Medical History:  Diagnosis Date  . Diabetes mellitus without complication ([place])   . GERD (gastroesophageal reflux disease)   . Gout   . Hypertension   . 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 Thyroid disease    Past Surgical History:  Procedure Laterality Date  . ABDOMINAL HYSTERECTOMY     ovaries intact  . BLADDER SURGERY     bladder tuck  . BREAST BIOPSY Right 03/20/2016   path pending x 2 area  . CARDIAC CATHETERIZATION    . CARPAL TUNNEL RELEASE    . DILATION AND CURETTAGE OF UTERUS    . EYE SURGERY     eyelid  . FRACTURE SURGERY     left ankle-plate and screws palced  . LITHOTRIPSY    . NISSEN FUNDOPLICATION    . SHOULDER SURGERY Right   . TONSILLECTOMY     Family History  Problem Relation Age of Onset  . Stroke Mother   . Hypertension Mother   . Heart disease Mother   . Arthritis Mother   . Heart disease Father   . Hypertension Father   . Diabetes Sister   . Hypertension Sister   . Asthma Sister   . Hypertension Sister   . Diverticulitis Sister   . Colon cancer Maternal Grandmother   . Breast cancer Maternal Grandmother   . Breast cancer Maternal Aunt    History  Sexual Activity  . Sexual activity: Not on file    Outpatient Encounter Prescriptions as of 01/19/2017  Medication Sig  . allopurinol (ZYLOPRIM) 100 MG tablet TAKE 1 TABLET BY  MOUTH EVERY DAY  . atorvastatin (LIPITOR) 10 MG tablet TAKE 1 TABLET BY MOUTH EVERY DAY AT BEDTIME  . colchicine 0.6 MG tablet Take 0.6 mg by mouth daily.   . Continuous Blood Gluc Sensor ([place]ovray) MISC 1 each by Does not apply route daily.  . empagliflozin (JARDIANCE) 25 MG TABS tablet Take 25 mg by mouth daily.  . furosemide (LASIX) 40 MG tablet TAKE 1 TABLET BY MOUTH EVERY DAY  . gabapentin (NEURONTIN) 400 MG capsule Take 1 capsule (400 mg total) by mouth 3 (three) times daily.  Marland Kitchen glimepiride (AMARYL) 4 MG tablet Take 1 tablet (4 mg total) by mouth 2 (two) times daily.  Marland Kitchen glucose blood test strip Check blood sugar daily. Use as instructed  . levothyroxine (SYNTHROID, LEVOTHROID) 88 MCG tablet TAKE 1 TABLET BY MOUTH EVERY DAY  . losartan (COZAAR) 50 MG tablet Take 1 tablet (50 mg total) by mouth daily.  . meclizine (ANTIVERT) 25 MG tablet Take 1 tablet (25 mg total) by mouth 3 (three) times daily as needed for dizziness.  . metFORMIN (GLUCOPHAGE) 1000 MG tablet TAKE 1 TABLET BY MOUTH TWICE A DAY  .  omeprazole (PRILOSEC) 20 MG capsule Take by mouth daily.   . ondansetron (ZOFRAN-ODT) 4 MG disintegrating tablet Take 4 mg by mouth every 8 (eight) hours as needed for nausea or vomiting.  . ONE TOUCH LANCETS MISC 1 each by Does not apply route daily.  . pioglitazone (ACTOS) 15 MG tablet Take 1 tablet (15 mg total) by mouth daily.   No facility-administered encounter medications on file as of 01/19/2017.     Activities of Daily Living In your present state of health, do you have any difficulty performing the following activities: 01/19/2017  Hearing? N  Vision? Y  Difficulty concentrating or making decisions? N  Walking or climbing stairs? Y  Dressing or bathing? N  Doing errands, shopping? N  Preparing Food and eating ? N  Using the Toilet? N  In the past six months, have you accidently leaked urine? Y  Do you have problems with loss of bowel control? Y  Managing  your Medications? N  Managing your Finances? N  Housekeeping or managing your Housekeeping? N  Some recent data might be hidden    Patient Care Team: [name], MD as PCP - General (Family Medicine) Dingeldein, Remo Lipps, MD as Consulting Physician (Ophthalmology) Gardiner Barefoot, DPM as Consulting Physician (Podiatry)    Assessment:     Exercise Activities and Dietary recommendations Current Exercise Habits: Structured exercise class, Type of exercise: treadmill;Other - see comments (bicycle and eliptical), Time (Minutes): 60, Frequency (Times/Week): 3, Weekly Exercise (Minutes/Week): 180, Intensity: Moderate, Exercise limited by: None identified  Goals    . Eat more fruits and vegetables          Recommend increasing fruits and vegetables in daily diet to at least 2 sevings a day.       Fall Risk Fall Risk  01/19/2017 12/08/2015 07/26/2015  Falls in the past year? Yes Yes Yes  Number falls in past yr: 2 or more 2 or more 2 or more  Injury with Fall? No No Yes  Risk Factor Category  - - (No Data)  Risk for fall due to : - Impaired balance/gait History of fall(s);Impaired balance/gait  Follow up (No Data) - -   Depression Screen PHQ 2/9 Scores 01/19/2017 01/19/2017 12/08/2015  PHQ - 2 Score 0 0 2  PHQ- 9 Score 4 4 5      Cognitive Function     6CIT Screen 01/19/2017  What Year? 0 points  What month? 0 points  What time? 0 points  Count back from 20 0 points  Months in reverse 0 points  Repeat phrase 0 points  Total Score 0    Immunization History  Administered Date(s) Administered  . Influenza, High Dose Seasonal PF 05/29/2016  . Pneumococcal Conjugate-13 05/21/2014  . Pneumococcal Polysaccharide-23 11/29/2004, 07/11/2010, 12/08/2015  . Td 07/05/1994  . Tdap 05/11/2011  . Zoster 05/07/2008   Screening Tests Health Maintenance  Topic Date Due  . INFLUENZA VACCINE  03/07/2017  . OPHTHALMOLOGY EXAM  03/27/2017  . HEMOGLOBIN A1C  04/02/2017  . FOOT EXAM   05/29/2017  . COLONOSCOPY  10/22/2017  . MAMMOGRAM  12/09/2017  . TETANUS/TDAP  05/10/2021  . DEXA SCAN  Completed  . Hepatitis C Screening  Completed  . PNA vac Low Risk Adult  Completed      Plan:  I have personally reviewed and addressed the Medicare Annual Wellness questionnaire and have noted the following in the patient's chart:  A. Medical and social history B. Use of alcohol, tobacco  or illicit drugs  C. Current medications and supplements D. Functional ability and status E.  Nutritional status F.  Physical activity G. Advance directives H. List of other physicians I.  Hospitalizations, surgeries, and ER visits in previous 12 months J.  [place] such as hearing and vision if needed, cognitive and depression L. Referrals and appointments - none  In addition, I have reviewed and discussed with patient certain preventive protocols, quality metrics, and best practice recommendations. A written personalized care plan for preventive services as well as general preventive health recommendations were provided to patient.  See attached scanned questionnaire for additional information.   Signed,  Fabio Neighbors, LPN Nurse Health Advisor   MD Recommendations: None.

## 2017-01-19 NOTE — Patient Instructions (Signed)
Teresa [name] , Thank you for taking time to come for your Medicare Wellness Visit. I appreciate your ongoing commitment to your health goals. Please review the following plan we discussed and let me know if I can assist you in the future.   Screening recommendations/referrals: Colonoscopy: completed 10/23/07, due 10/2017 Mammogram: completed 03/20/16, due 03/2026 Bone Density: completed 02/24/08 Recommended yearly ophthalmology/optometry visit for glaucoma screening and checkup Recommended yearly dental visit for hygiene and checkup  Vaccinations: Influenza vaccine: up to date, due 04/2017 Pneumococcal vaccine: completed series Tdap vaccine: completed 05/11/11, due 05/2021 Shingles vaccine: completed 05/07/08  Advanced directives: Please bring a copy of your POA (Power of [place]) and/or Living Will to your next appointment.   Conditions/risks identified: Recommend increasing fruits and vegetables in daily diet to at least 2 sevings a day.   Next appointment: 04/25/17 @ 9:00 AM   Preventive Care 65 Years and Older, Female Preventive care refers to lifestyle choices and visits with your health care provider that can promote health and wellness. What does preventive care include?  A yearly physical exam. This is also called an annual well check.  Dental exams once or twice a year.  Routine eye exams. Ask your health care provider how often you should have your eyes checked.  Personal lifestyle choices, including:  Daily care of your teeth and gums.  Regular physical activity.  Eating a healthy diet.  Avoiding tobacco and drug use.  Limiting alcohol use.  Practicing safe sex.  Taking low-dose aspirin every day.  Taking vitamin and mineral supplements as recommended by your health care provider. What happens during an annual well check? The services and screenings done by your health care provider during your annual well check will depend on your age, overall health,  lifestyle risk factors, and family history of disease. Counseling  Your health care provider may ask you questions about your:  Alcohol use.  Tobacco use.  Drug use.  Emotional well-being.  Home and relationship well-being.  Sexual activity.  Eating habits.  History of falls.  Memory and ability to understand (cognition).  Work and work Statistician.  Reproductive health. Screening  You may have the following tests or measurements:  Height, weight, and BMI.  Blood pressure.  Lipid and cholesterol levels. These may be checked every 5 years, or more frequently if you are over 64 years old.  Skin check.  Lung cancer screening. You may have this screening every year starting at age 14 if you have a 30-pack-year history of smoking and currently smoke or have quit within the past 15 years.  Fecal occult blood test (FOBT) of the stool. You may have this test every year starting at age 26.  Flexible sigmoidoscopy or colonoscopy. You may have a sigmoidoscopy every 5 years or a colonoscopy every 10 years starting at age 34.  Hepatitis C blood test.  Hepatitis B blood test.  Sexually transmitted disease (STD) testing.  Diabetes screening. This is done by checking your blood sugar (glucose) after you have not eaten for a while (fasting). You may have this done every 1-3 years.  Bone density scan. This is done to screen for osteoporosis. You may have this done starting at age 37.  Mammogram. This may be done every 1-2 years. Talk to your health care provider about how often you should have regular mammograms. Talk with your health care provider about your test results, treatment options, and if necessary, the need for more tests. Vaccines  Your health care provider  may recommend certain vaccines, such as:  Influenza vaccine. This is recommended every year.  Tetanus, diphtheria, and acellular pertussis (Tdap, Td) vaccine. You may need a Td booster every 10 years.  Zoster  vaccine. You may need this after age 57.  Pneumococcal 13-valent conjugate (PCV13) vaccine. One dose is recommended after age 55.  Pneumococcal polysaccharide (PPSV23) vaccine. One dose is recommended after age 56. Talk to your health care provider about which screenings and vaccines you need and how often you need them. This information is not intended to replace advice given to you by your health care provider. Make sure you discuss any questions you have with your health care provider. Document Released: 08/20/2015 Document Revised: 04/12/2016 Document Reviewed: 05/25/2015 Elsevier Interactive Patient Education  2017 [place] Prevention in the Home Falls can cause injuries. They can happen to people of all ages. There are many things you can do to make your home safe and to help prevent falls. What can I do on the outside of my home?  Regularly fix the edges of walkways and driveways and fix any cracks.  Remove anything that might make you trip as you walk through a door, such as a raised step or threshold.  Trim any bushes or trees on the path to your home.  Use bright outdoor lighting.  Clear any walking paths of anything that might make someone trip, such as rocks or tools.  Regularly check to see if handrails are loose or broken. Make sure that both sides of any steps have handrails.  Any raised decks and porches should have guardrails on the edges.  Have any leaves, snow, or ice cleared regularly.  Use sand or salt on walking paths during winter.  Clean up any spills in your garage right away. This includes oil or grease spills. What can I do in the bathroom?  Use night lights.  Install grab bars by the toilet and in the tub and shower. Do not use towel bars as grab bars.  Use non-skid mats or decals in the tub or shower.  If you need to sit down in the shower, use a plastic, non-slip stool.  Keep the floor dry. Clean up any water that spills on the  floor as soon as it happens.  Remove soap buildup in the tub or shower regularly.  Attach bath mats securely with double-sided non-slip rug tape.  Do not have throw rugs and other things on the floor that can make you trip. What can I do in the bedroom?  Use night lights.  Make sure that you have a light by your bed that is easy to reach.  Do not use any sheets or blankets that are too big for your bed. They should not hang down onto the floor.  Have a firm chair that has side arms. You can use this for support while you get dressed.  Do not have throw rugs and other things on the floor that can make you trip. What can I do in the kitchen?  Clean up any spills right away.  Avoid walking on wet floors.  Keep items that you use a lot in easy-to-reach places.  If you need to reach something above you, use a strong step stool that has a grab bar.  Keep electrical cords out of the way.  Do not use floor polish or wax that makes floors slippery. If you must use wax, use non-skid floor wax.  Do not have throw rugs  and other things on the floor that can make you trip. What can I do with my stairs?  Do not leave any items on the stairs.  Make sure that there are handrails on both sides of the stairs and use them. Fix handrails that are broken or loose. Make sure that handrails are as long as the stairways.  Check any carpeting to make sure that it is firmly attached to the stairs. Fix any carpet that is loose or worn.  Avoid having throw rugs at the top or bottom of the stairs. If you do have throw rugs, attach them to the floor with carpet tape.  Make sure that you have a light switch at the top of the stairs and the bottom of the stairs. If you do not have them, ask someone to add them for you. What else can I do to help prevent falls?  Wear shoes that:  Do not have high heels.  Have rubber bottoms.  Are comfortable and fit you well.  Are closed at the toe. Do not wear  sandals.  If you use a stepladder:  Make sure that it is fully opened. Do not climb a closed stepladder.  Make sure that both sides of the stepladder are locked into place.  Ask someone to hold it for you, if possible.  Clearly mark and make sure that you can see:  Any grab bars or handrails.  First and last steps.  Where the edge of each step is.  Use tools that help you move around (mobility aids) if they are needed. These include:  Canes.  Walkers.  Scooters.  Crutches.  Turn on the lights when you go into a dark area. Replace any light bulbs as soon as they burn out.  Set up your furniture so you have a clear path. Avoid moving your furniture around.  If any of your floors are uneven, fix them.  If there are any pets around you, be aware of where they are.  Review your medicines with your doctor. Some medicines can make you feel dizzy. This can increase your chance of falling. Ask your doctor what other things that you can do to help prevent falls. This information is not intended to replace advice given to you by your health care provider. Make sure you discuss any questions you have with your health care provider. Document Released: 05/20/2009 Document Revised: 12/30/2015 Document Reviewed: 08/28/2014 Elsevier Interactive Patient Education  2017 [employer].

## 2017-01-23 ENCOUNTER — Ambulatory Visit (INDEPENDENT_AMBULATORY_CARE_PROVIDER_SITE_OTHER): Payer: PPO | Admitting: Family Medicine

## 2017-01-23 ENCOUNTER — Ambulatory Visit: Payer: PPO

## 2017-01-23 ENCOUNTER — Encounter: Payer: Self-pay | Admitting: Family Medicine

## 2017-01-23 VITALS — BP 110/56 | HR 52 | Temp 97.7°F | Resp 16 | Wt 195.0 lb

## 2017-01-23 DIAGNOSIS — I10 Essential (primary) hypertension: Secondary | ICD-10-CM | POA: Diagnosis not present

## 2017-01-23 DIAGNOSIS — M109 Gout, unspecified: Secondary | ICD-10-CM

## 2017-01-23 DIAGNOSIS — E7800 Pure hypercholesterolemia, unspecified: Secondary | ICD-10-CM | POA: Diagnosis not present

## 2017-01-23 DIAGNOSIS — B372 Candidiasis of skin and nail: Secondary | ICD-10-CM | POA: Diagnosis not present

## 2017-01-23 DIAGNOSIS — E119 Type 2 diabetes mellitus without complications: Secondary | ICD-10-CM | POA: Diagnosis not present

## 2017-01-23 LAB — POCT GLYCOSYLATED HEMOGLOBIN (HGB A1C): Hemoglobin A1C: 8.1

## 2017-01-23 MED ORDER — NYSTATIN 100000 UNIT/GM EX CREA: 1.0000 "application " | TOPICAL_CREAM | Freq: Two times a day (BID) | CUTANEOUS | 0 refills | Status: DC

## 2017-01-23 NOTE — Progress Notes (Signed)
Subjective:  HPI  Diabetes Mellitus Type II, Follow-up:   Lab Results  Component Value Date   HGBA1C 8.2 10/03/2016   HGBA1C 7.9 05/29/2016   HGBA1C 9.3 01/26/2016    Last seen for diabetes 3 months ago.  Management since then includes none. She reports good compliance with treatment. She is not having side effects.  Home blood sugar records: 120-160  Episodes of hypoglycemia? 1-2 but generally no.   Current Insulin Regimen: n/a Most Recent Eye Exam: every 6 months, next appt in August.  Weight trend: stable Current exercise: 3 times a week  Pertinent Labs:    Component Value Date/Time   CHOL 142 07/26/2015 0905   CHOL 200 04/04/2013 1047   TRIG 183 (H) 07/26/2015 0905   TRIG 191 04/04/2013 1047   HDL 40 07/26/2015 0905   HDL 45 04/04/2013 1047   LDLCALC 65 07/26/2015 0905   LDLCALC 117 (H) 04/04/2013 1047   CREATININE 0.60 02/24/2016 1419   CREATININE 0.73 05/14/2014 0956    Wt Readings from Last 3 Encounters:  01/23/17 195 lb (88.5 kg)  01/19/17 196 lb 3.2 oz (89 kg)  09/26/16 194 lb (88 kg)    ------------------------------------------------------------------------  Pt reports that she has a yeast infection underneath her breast, in her groin and in the vaginal area. She reports that it is red, itchy and hurts. She is requesting nystatin cream for this.      Prior to Admission medications   Medication Sig Start Date End Date Taking? Authorizing Provider  allopurinol (ZYLOPRIM) 100 MG tablet TAKE 1 TABLET BY MOUTH EVERY DAY 01/17/16   [name], MD  atorvastatin (LIPITOR) 10 MG tablet TAKE 1 TABLET BY MOUTH EVERY DAY AT BEDTIME 01/03/17   [name], MD  colchicine 0.6 MG tablet Take 0.6 mg by mouth daily.     [provider]  Continuous Blood Gluc Sensor ([place]) MISC 1 each by Does not apply route daily. 09/28/16   [name], MD  empagliflozin (JARDIANCE) 25 MG TABS tablet Take 25  mg by mouth daily. 01/26/16   [name], MD  furosemide (LASIX) 40 MG tablet TAKE 1 TABLET BY MOUTH EVERY DAY 01/17/16   [name], MD  gabapentin (NEURONTIN) 400 MG capsule Take 1 capsule (400 mg total) by mouth 3 (three) times daily. 04/12/16   [name], MD  glimepiride (AMARYL) 4 MG tablet Take 1 tablet (4 mg total) by mouth 2 (two) times daily. 05/29/16   [name], MD  glucose blood test strip Check blood sugar daily. Use as instructed 09/26/16   [name], MD  levothyroxine (SYNTHROID, LEVOTHROID) 88 MCG tablet TAKE 1 TABLET BY MOUTH EVERY DAY 01/17/16   [name], MD  losartan (COZAAR) 50 MG tablet Take 1 tablet (50 mg total) by mouth daily. 09/26/16   [name], MD  meclizine (ANTIVERT) 25 MG tablet Take 1 tablet (25 mg total) by mouth 3 (three) times daily as needed for dizziness. 01/26/16   [name], MD  metFORMIN (GLUCOPHAGE) 1000 MG tablet TAKE 1 TABLET BY MOUTH TWICE A DAY 01/03/17   [name], MD  omeprazole (PRILOSEC) 20 MG capsule Take by mouth daily.  10/05/14   [provider]  ondansetron (ZOFRAN-ODT) 4 MG disintegrating tablet Take 4 mg by mouth every 8 (eight) hours as needed for nausea  or vomiting.    [provider]  ONE TOUCH LANCETS MISC 1 each by Does not apply route daily. 08/13/15   [name], MD  pioglitazone (ACTOS) 15 MG tablet Take 1 tablet (15 mg total) by mouth daily. 04/11/16   [name], MD    Patient Active Problem List   Diagnosis Date Noted  . Microcalcifications of the breast 12/15/2015  . Back pain, chronic 06/28/2015  . Chronic airway obstruction ([place]) 06/28/2015  . 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 HYPERLIPIDEMIA 06/21/2009  . HYPERTENSION 06/21/2009  . 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 FEVER UNSPECIFIED 06/21/2009  . NAUSEA 06/21/2009  . DYSPHAGIA 06/21/2009  . RUQ PAIN 06/21/2009  .  HIATAL HERNIA 01/07/2008  . HYPOTHYROIDISM 11/22/2007  . ANXIETY 11/22/2007  . DEPRESSION 11/22/2007  . HYPERTENSIVE CARDIOVASCULAR DISEASE 11/22/2007  . 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 RENAL CALCULUS 11/22/2007  . DEGENERATIVE JOINT DISEASE 11/22/2007  . SLEEP APNEA 11/22/2007  . HEADACHE, CHRONIC 11/22/2007  . GASTRITIS 10/23/2007  . DIVERTICULOSIS, COLON 10/23/2007  . Diabetes ([place]) 07/16/2007  . HYPERCHOLESTEROLEMIA 07/16/2007  . OBESITY, MODERATE 07/16/2007  . ISCHEMIC HEART DISEASE 07/16/2007  . ESOPHAGEAL REFLUX 07/16/2007  . SCLERODERMA 07/16/2007  . DYSPNEA 07/16/2007  . COUGH, CHRONIC 07/16/2007    Past Medical History:  Diagnosis Date  . Diabetes mellitus without complication ([place])   . GERD (gastroesophageal reflux disease)   . Gout   . Hypertension   . 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 Thyroid disease     Social History   Social History  . Marital status: Divorced    Spouse name: N/A  . Number of children: N/A  . Years of education: N/A   Occupational History  . Not on file.   Social History Main Topics  . Smoking status: Never Smoker  . Smokeless tobacco: Never Used  . Alcohol use No  . Drug use: No  . Sexual activity: Not on file   Other Topics Concern  . Not on file   Social History Narrative  . No narrative on file    Allergies  Allergen Reactions  . Bacitracin-Neomycin-Polymyxin   . Clarithromycin Other (See Comments), Nausea Only and Nausea And Vomiting  . Codeine   . Dilaudid  [Hydromorphone Hcl] Nausea And Vomiting  . Hydromorphone Other (See Comments), Nausea Only and Nausea And Vomiting  . Iodine   . Iohexol      Desc: PT HAS HIVES/ITCHING   . Morphine   . Neomycin-Bacitracin Zn-Polymyx   . Oseltamivir Other (See Comments) and Nausea And Vomiting  . Tamiflu  [Oseltamivir Phosphate]     Other reaction(s): Abdominal Pain, Vomiting  . Zolpidem Other (See Comments)  . Zolpidem Tartrate   . Benzalkonium Chloride Itching, Rash and  Swelling  . Lidocaine Hcl Itching, Rash and Swelling  . Tape Rash    Review of Systems  Constitutional: Negative.   HENT: Negative.   Eyes: Negative.   Respiratory: Negative.   Cardiovascular: Negative.   Gastrointestinal: Negative.   Genitourinary: Negative.   Musculoskeletal: Positive for falls (she is seeing neurology for this.).  Skin: Negative.   Neurological: Negative.   Endo/Heme/Allergies: Negative.   Psychiatric/Behavioral: Negative.     Immunization History  Administered Date(s) Administered  . Influenza, High Dose Seasonal PF 05/29/2016  . Pneumococcal Conjugate-13 05/21/2014  . Pneumococcal Polysaccharide-23 11/29/2004, 07/11/2010, 12/08/2015  . Td 07/05/1994  . Tdap 05/11/2011  . Zoster 05/07/2008    Objective:  BP Marland Kitchen)  110/56 (BP Location: Left Arm, Patient Position: Sitting, Cuff Size: Large)   Pulse (!) 52   Temp 97.7 F (36.5 C) (Oral)   Resp 16   Wt 195 lb (88.5 kg)   BMI 35.67 kg/m   Physical Exam  Constitutional: She is oriented to person, place, and time and well-developed, well-nourished, and in no distress.  HENT:  Head: Normocephalic and atraumatic.  Right Ear: External ear normal.  Left Ear: External ear normal.  Nose: Nose normal.  Eyes: Conjunctivae and EOM are normal. Pupils are equal, round, and reactive to light.  Neck: Normal range of motion. Neck supple.  Cardiovascular: Normal rate, regular rhythm, normal heart sounds and intact distal pulses.   Pulmonary/Chest: Effort normal and breath sounds normal.  Abdominal: Soft.  Musculoskeletal: Normal range of motion.  Bone cyst on lower right shin.   Neurological: She is alert and oriented to person, place, and time. She has normal reflexes. Gait normal. GCS score is 15.  Skin: Skin is warm and dry.  Resolving yeast dermatitis under breast.   Psychiatric: Mood, memory, affect and judgment normal.    Lab Results  Component Value Date   WBC 8.2 07/26/2015   HGB 14.5 07/26/2015    HCT 43.6 07/26/2015   PLT 252 07/26/2015   GLUCOSE 129 (H) 02/24/2016   CHOL 142 07/26/2015   TRIG 183 (H) 07/26/2015   HDL 40 07/26/2015   LDLCALC 65 07/26/2015   TSH 1.180 01/26/2016   HGBA1C 8.2 10/03/2016   MICROALBUR 20 12/08/2015    CMP     Component Value Date/Time   NA 140 02/24/2016 1419   NA 141 05/14/2014 0956   K 4.4 02/24/2016 1419   K 3.7 05/14/2014 0956   CL 97 02/24/2016 1419   CL 106 05/14/2014 0956   CO2 23 02/24/2016 1419   CO2 25 05/14/2014 0956   GLUCOSE 129 (H) 02/24/2016 1419   GLUCOSE 142 (H) 05/14/2014 0956   BUN 16 02/24/2016 1419   BUN 16 05/14/2014 0956   CREATININE 0.60 02/24/2016 1419   CREATININE 0.73 05/14/2014 0956   CALCIUM 9.7 02/24/2016 1419   CALCIUM 9.3 05/14/2014 0956   PROT 7.4 01/26/2016 0941   PROT 7.6 04/04/2013 1047   ALBUMIN 4.5 02/24/2016 1419   ALBUMIN 4.0 04/04/2013 1047   AST 25 01/26/2016 0941   AST 33 04/04/2013 1047   ALT 35 (H) 01/26/2016 0941   ALT 39 04/04/2013 1047   ALKPHOS 84 01/26/2016 0941   ALKPHOS 91 04/04/2013 1047   BILITOT 0.4 01/26/2016 0941   BILITOT 0.4 04/04/2013 1047   GFRNONAA 93 02/24/2016 1419   GFRNONAA >60 05/14/2014 0956   GFRNONAA >60 04/04/2013 1047   GFRAA 107 02/24/2016 1419   GFRAA >60 05/14/2014 0956   GFRAA >60 04/04/2013 1047    Assessment and Plan :  1. Type 2 diabetes mellitus without complication, without long-term current use of insulin (HCC)  - POCT HgB A1C  2. Essential hypertension  - CBC with Differential/Platelet - TSH  3. Yeast dermatitis  - nystatin cream (MYCOSTATIN); Apply 1 application topically 2 (two) times daily.  Dispense: 30 g; Refill: 0  4. HYPERCHOLESTEROLEMIA  - Lipid Panel With LDL/HDL Ratio - Comprehensive metabolic panel  5. Gout, unspecified cause, unspecified chronicity, unspecified site  - Uric acid   HPI, Exam, and A&P Transcribed under the direction and in the presence of Thedford Bunton L. Cranford Mon, MD  Electronically Signed: Katina Dung, CMA  Miguel Aschoff MD [facility]  [facility] 01/23/2017 9:07 AM

## 2017-01-24 ENCOUNTER — Ambulatory Visit (INDEPENDENT_AMBULATORY_CARE_PROVIDER_SITE_OTHER): Payer: PPO | Admitting: Podiatry

## 2017-01-24 DIAGNOSIS — E1149 Type 2 diabetes mellitus with other diabetic neurological complication: Secondary | ICD-10-CM

## 2017-01-24 DIAGNOSIS — F40298 Other specified phobia: Secondary | ICD-10-CM

## 2017-01-24 DIAGNOSIS — R2681 Unsteadiness on feet: Secondary | ICD-10-CM

## 2017-01-24 DIAGNOSIS — R296 Repeated falls: Secondary | ICD-10-CM

## 2017-01-24 LAB — URIC ACID: URIC ACID: 5.8 mg/dL (ref 2.5–7.1)

## 2017-01-24 LAB — LIPID PANEL WITH LDL/HDL RATIO
CHOLESTEROL TOTAL: 143 mg/dL (ref 100–199)
HDL: 44 mg/dL (ref >39)
LDL Calculated: 59 mg/dL (ref 0–99)
LDl/HDL Ratio: 1.3 ratio (ref 0.0–3.2)
Triglycerides: 201 mg/dL — ABNORMAL HIGH (ref 0–149)
VLDL CHOLESTEROL CAL: 40 mg/dL (ref 5–40)

## 2017-01-24 LAB — CBC WITH DIFFERENTIAL/PLATELET
BASOS ABS: 0.1 10*3/uL (ref 0.0–0.2)
Basos: 1 %
EOS (ABSOLUTE): 0.4 10*3/uL (ref 0.0–0.4)
Eos: 4 %
Hematocrit: 44.9 % (ref 34.0–46.6)
Hemoglobin: 14.9 g/dL (ref 11.1–15.9)
IMMATURE GRANS (ABS): 0 10*3/uL (ref 0.0–0.1)
Immature Granulocytes: 0 %
LYMPHS: 41 %
Lymphocytes Absolute: 3.9 10*3/uL — ABNORMAL HIGH (ref 0.7–3.1)
MCH: 30.2 pg (ref 26.6–33.0)
MCHC: 33.2 g/dL (ref 31.5–35.7)
MCV: 91 fL (ref 79–97)
Monocytes Absolute: 0.6 10*3/uL (ref 0.1–0.9)
Monocytes: 6 %
NEUTROS ABS: 4.6 10*3/uL (ref 1.4–7.0)
Neutrophils: 48 %
PLATELETS: 238 10*3/uL (ref 150–379)
RBC: 4.93 x10E6/uL (ref 3.77–5.28)
RDW: 15 % (ref 12.3–15.4)
WBC: 9.5 10*3/uL (ref 3.4–10.8)

## 2017-01-24 LAB — COMPREHENSIVE METABOLIC PANEL
ALBUMIN: 4.7 g/dL (ref 3.5–4.8)
ALT: 26 IU/L (ref 0–32)
AST: 26 IU/L (ref 0–40)
Albumin/Globulin Ratio: 1.8 (ref 1.2–2.2)
Alkaline Phosphatase: 83 IU/L (ref 39–117)
BILIRUBIN TOTAL: 0.4 mg/dL (ref 0.0–1.2)
BUN / CREAT RATIO: 25 (ref 12–28)
BUN: 18 mg/dL (ref 8–27)
CALCIUM: 9.7 mg/dL (ref 8.7–10.3)
CO2: 20 mmol/L (ref 20–29)
CREATININE: 0.71 mg/dL (ref 0.57–1.00)
Chloride: 102 mmol/L (ref 96–106)
GFR, EST AFRICAN AMERICAN: 99 mL/min/{1.73_m2} (ref >59)
GFR, EST NON AFRICAN AMERICAN: 86 mL/min/{1.73_m2} (ref >59)
GLUCOSE: 155 mg/dL — AB (ref 65–99)
Globulin, Total: 2.6 g/dL (ref 1.5–4.5)
Potassium: 4 mmol/L (ref 3.5–5.2)
Sodium: 142 mmol/L (ref 134–144)
TOTAL PROTEIN: 7.3 g/dL (ref 6.0–8.5)

## 2017-01-24 LAB — TSH: TSH: 1.43 u[IU]/mL (ref 0.450–4.500)

## 2017-01-24 NOTE — Progress Notes (Signed)
Advised  ED 

## 2017-01-24 NOTE — Progress Notes (Signed)
Patient presented today upon recommendation from Doctor Prudence Davidson for evaluation for [place].   Patient has hx of stumbling/loosing balance/falling.  Patient also states she has real fear of falling.  Several balance tests were offered and patient attempted:    TUG test:  Patient wasn't able to stand up more than 3 times in 30 seconds, and struggled in rising from chair.  Four Stage Balance test:  Patient struggled placing foot in front of the arch of other, and was unable to place one foot in front of other toe to heel.  Patient was unable to stand on one foot.   Based upon patient's history of stumbling, her gait instability, dm2 neuropathy, and unable to complete objective tests, patient is excellent candidate for balance brace.  [employer] prior Josem Kaufmann will be sought.    Gardiner Barefoot DPM

## 2017-02-14 ENCOUNTER — Other Ambulatory Visit: Payer: Self-pay | Admitting: Family Medicine

## 2017-02-18 ENCOUNTER — Other Ambulatory Visit: Payer: Self-pay | Admitting: Family Medicine

## 2017-02-21 ENCOUNTER — Ambulatory Visit (INDEPENDENT_AMBULATORY_CARE_PROVIDER_SITE_OTHER): Payer: PPO | Admitting: Podiatry

## 2017-02-21 DIAGNOSIS — R2681 Unsteadiness on feet: Secondary | ICD-10-CM

## 2017-02-21 NOTE — Progress Notes (Signed)
Patient came to pick up brace (MBB) and shoes; however the shoes were too narrow for brace..shoes are reordered into a Wide size.Marland KitchenMarland KitchenMarland Kitchen

## 2017-02-28 ENCOUNTER — Ambulatory Visit (INDEPENDENT_AMBULATORY_CARE_PROVIDER_SITE_OTHER): Payer: PPO | Admitting: Podiatry

## 2017-02-28 DIAGNOSIS — F40298 Other specified phobia: Secondary | ICD-10-CM | POA: Diagnosis not present

## 2017-02-28 DIAGNOSIS — E1149 Type 2 diabetes mellitus with other diabetic neurological complication: Secondary | ICD-10-CM

## 2017-02-28 DIAGNOSIS — R2681 Unsteadiness on feet: Secondary | ICD-10-CM

## 2017-02-28 NOTE — Progress Notes (Signed)
Reordered shoes with MBB.   [id number] size 9XW.  Diabetes with neuropathy  Fear of falling.  Gait instability    Gardiner Barefoot DPM

## 2017-03-18 ENCOUNTER — Other Ambulatory Visit: Payer: Self-pay | Admitting: Family Medicine

## 2017-03-18 DIAGNOSIS — E119 Type 2 diabetes mellitus without complications: Secondary | ICD-10-CM

## 2017-03-19 ENCOUNTER — Other Ambulatory Visit: Payer: Self-pay | Admitting: Family Medicine

## 2017-03-28 ENCOUNTER — Ambulatory Visit (INDEPENDENT_AMBULATORY_CARE_PROVIDER_SITE_OTHER): Payer: PPO | Admitting: Orthotics

## 2017-03-28 DIAGNOSIS — E1149 Type 2 diabetes mellitus with other diabetic neurological complication: Secondary | ICD-10-CM

## 2017-03-28 DIAGNOSIS — R296 Repeated falls: Secondary | ICD-10-CM

## 2017-03-28 DIAGNOSIS — M205X9 Other deformities of toe(s) (acquired), unspecified foot: Secondary | ICD-10-CM

## 2017-03-28 DIAGNOSIS — M2020 Hallux rigidus, unspecified foot: Secondary | ICD-10-CM

## 2017-03-28 NOTE — Progress Notes (Signed)
Patient came in today to pick up [place] Balance Brace (Bilateral).   Patient was able to don brace independently and brace was check for fit to custom.   Patient was observed walking with brace and gait was improved as well as stability.   Patient was advised of care and wearing instructions.  Advised to notify practice if there were any issues; especially skin irritation.  

## 2017-04-12 DIAGNOSIS — E119 Type 2 diabetes mellitus without complications: Secondary | ICD-10-CM | POA: Diagnosis not present

## 2017-04-13 ENCOUNTER — Encounter: Payer: Self-pay | Admitting: Family Medicine

## 2017-04-18 ENCOUNTER — Other Ambulatory Visit: Payer: Self-pay | Admitting: Family Medicine

## 2017-04-18 DIAGNOSIS — E118 Type 2 diabetes mellitus with unspecified complications: Secondary | ICD-10-CM

## 2017-04-23 ENCOUNTER — Ambulatory Visit (INDEPENDENT_AMBULATORY_CARE_PROVIDER_SITE_OTHER): Payer: PPO | Admitting: Podiatry

## 2017-04-23 ENCOUNTER — Ambulatory Visit (INDEPENDENT_AMBULATORY_CARE_PROVIDER_SITE_OTHER): Payer: PPO

## 2017-04-23 DIAGNOSIS — B351 Tinea unguium: Secondary | ICD-10-CM | POA: Diagnosis not present

## 2017-04-23 DIAGNOSIS — M19079 Primary osteoarthritis, unspecified ankle and foot: Secondary | ICD-10-CM | POA: Diagnosis not present

## 2017-04-23 DIAGNOSIS — M79676 Pain in unspecified toe(s): Secondary | ICD-10-CM

## 2017-04-23 DIAGNOSIS — M2020 Hallux rigidus, unspecified foot: Secondary | ICD-10-CM

## 2017-04-23 DIAGNOSIS — M205X9 Other deformities of toe(s) (acquired), unspecified foot: Secondary | ICD-10-CM

## 2017-04-23 MED ORDER — MELOXICAM 15 MG PO TABS: 15.0000 mg | ORAL_TABLET | Freq: Every day | ORAL | 3 refills | Status: DC

## 2017-04-23 NOTE — Progress Notes (Signed)
   Subjective:    Patient ID: Teresa [name], female    DOB: [birth year], 71 y.o.   MRN: [id number]  Diabetes    this patient presents the office with chief complaint of long thick painful nails. She states she is unable to do her nails properly. She says her nails are painful walking and wearing her shoes.  She presents the office today for an evaluation and treatment of her nails.  She also says she has pain and swelling right foot with pain extending from her big toe up her leg.  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    Review of Systems  All other systems reviewed and are negative.      Objective:   Physical Exam GENERAL APPEARANCE: Alert, conversant. Appropriately groomed. No acute distress.  VASCULAR: Pedal pulses are  palpable at  [facility] and PT bilateral.  Capillary refill time is immediate to all digits,  Normal temperature gradient.  Digital hair growth is present bilateral  NEUROLOGIC: sensation is diminished  to 5.07 monofilament at 5/5 sites bilateral.  Light touch is intact bilateral, Muscle strength normal.  MUSCULOSKELETAL: acceptable muscle strength, tone and stability bilateral.  Intrinsic muscluature intact bilateral.  DJD 1st MPJ .  Limited ROM 1st MPJ  B/L.  Palpable pain 1st MPJ  Right foot.  DERMATOLOGIC: skin color, texture, and turgor are within normal limits.  No preulcerative lesions or ulcers  are seen, no interdigital maceration noted.  No open lesions present.   No drainage noted.  NAILS  Thick disfigured discolored nails both feet.         Assessment & Plan:  Onychomycosis  Diabetes with neuropathy  Hallux limitus 1st MPJ  B/L   IE  Debridement of nails  RTC 3 months. X-rays taken do reveal an elongated first metatarsal an elevated first metatarsal on the right foot. Discussed her arthritic big toe joint, right foot and we decided to treat her with anti-inflammatory medicine for 3 weeks.  She is to return the office in 3 weeks for  reevaluation and a possible x-ray.  Prescribed Mobic   Gardiner Barefoot DPM    Gardiner Barefoot DPM

## 2017-04-23 NOTE — Addendum Note (Signed)
Addended by: Graceann Congress D on: 04/23/2017 08:56 AM   Modules accepted: Orders

## 2017-05-01 ENCOUNTER — Other Ambulatory Visit: Payer: Self-pay | Admitting: Family Medicine

## 2017-05-03 NOTE — Addendum Note (Signed)
Addended by: Velora Heckler on: 05/03/2017 11:31 PM   Modules accepted: Level of Service

## 2017-05-14 ENCOUNTER — Ambulatory Visit (INDEPENDENT_AMBULATORY_CARE_PROVIDER_SITE_OTHER): Payer: PPO | Admitting: Podiatry

## 2017-05-14 DIAGNOSIS — M19079 Primary osteoarthritis, unspecified ankle and foot: Secondary | ICD-10-CM | POA: Diagnosis not present

## 2017-05-14 DIAGNOSIS — M2020 Hallux rigidus, unspecified foot: Secondary | ICD-10-CM

## 2017-05-14 DIAGNOSIS — M205X9 Other deformities of toe(s) (acquired), unspecified foot: Secondary | ICD-10-CM | POA: Diagnosis not present

## 2017-05-14 NOTE — Progress Notes (Signed)
This patient returns to the office for continued evaluation of her right big toe.  She was diagnosed with arthritis both big toes with right more painful than left.  She was prescribed Mobic and she has been taking the medicine with no side effects.  She says her right foot is somewhat better.  Pain is not extending into her right lower leg.  There is still pain in right big toe joint.  General Appearance  Alert, conversant and in no acute stress.  Vascular  Dorsalis pedis and posterior pulses are palpable  bilaterally.  Capillary return is within normal limits  Bilaterally. Temperature is within normal limits  Bilaterally  Neurologic  Senn-Weinstein monofilament wire test diminished  bilaterally. Muscle power  Within normal limits bilaterally.  Nails Thick disfigured discolored nails with subungual debride bilaterally from hallux to fifth toes bilaterally. No evidence of bacterial infection or drainage bilaterally.  Orthopedic  No limitations of motion of motion feet bilaterally.  No crepitus or effusions noted.  No bony pathology or digital deformities noted. Dorsal lipping 1st MPJ  B/L.  Pain and crepitus on ROM 1st MPJ right. Hallux limitus 1st MPJ  Right.  Skin  normotropic skin with no porokeratosis noted bilaterally.  No signs of infections or ulcers noted.    Hallux limitus 1st MPJ  Right  ROV  Injection therapy with kenalog- la and dex. Phosphate.  RTC 3 weeks.  Continue on Mobic as prescribed.   Gardiner Barefoot DPM

## 2017-06-04 ENCOUNTER — Ambulatory Visit: Payer: PPO | Admitting: Podiatry

## 2017-06-06 ENCOUNTER — Ambulatory Visit (INDEPENDENT_AMBULATORY_CARE_PROVIDER_SITE_OTHER): Payer: PPO | Admitting: Family Medicine

## 2017-06-06 VITALS — BP 136/78 | HR 62 | Temp 98.4°F | Resp 16 | Wt 199.4 lb

## 2017-06-06 DIAGNOSIS — E118 Type 2 diabetes mellitus with unspecified complications: Secondary | ICD-10-CM | POA: Diagnosis not present

## 2017-06-06 DIAGNOSIS — M109 Gout, unspecified: Secondary | ICD-10-CM | POA: Diagnosis not present

## 2017-06-06 DIAGNOSIS — I10 Essential (primary) hypertension: Secondary | ICD-10-CM

## 2017-06-06 DIAGNOSIS — Z23 Encounter for immunization: Secondary | ICD-10-CM

## 2017-06-06 DIAGNOSIS — E119 Type 2 diabetes mellitus without complications: Secondary | ICD-10-CM | POA: Diagnosis not present

## 2017-06-06 DIAGNOSIS — E7800 Pure hypercholesterolemia, unspecified: Secondary | ICD-10-CM

## 2017-06-06 LAB — POCT UA - MICROALBUMIN: Microalbumin Ur, POC: 50 mg/L

## 2017-06-06 LAB — POCT GLYCOSYLATED HEMOGLOBIN (HGB A1C): Hemoglobin A1C: 8.6

## 2017-06-06 MED ORDER — PIOGLITAZONE HCL 30 MG PO TABS: 30.0000 mg | ORAL_TABLET | Freq: Every day | ORAL | 3 refills | Status: DC

## 2017-06-06 NOTE — Progress Notes (Signed)
Teresa [name]  MRN: [id number] DOB: 06-Apr-1946  Subjective:  HPI  Patient is here for follow up. Last office visit was on 01/23/17. Routine lab work was done on 01/23/17. DM: patient is checking her sugar at home twice daily. Fasting readings are usually around 120-150. She has chronic numbness in her hands and feet. Lab Results  Component Value Date   HGBA1C 8.1 01/23/2017   HTN: patient sometimes checks her b/p. Readings have been around 120/76. No cardiac symptoms. BP Readings from Last 3 Encounters:  06/06/17 136/78  01/23/17 (!) 110/56  01/19/17 122/60   Wt Readings from Last 3 Encounters:  06/06/17 199 lb 6.4 oz (90.4 kg)  01/23/17 195 lb (88.5 kg)  01/19/17 196 lb 3.2 oz (89 kg)    Patient Active Problem List   Diagnosis Date Noted  . Microcalcifications of the breast 12/15/2015  . Back pain, chronic 06/28/2015  . Chronic airway obstruction ([place]) 06/28/2015  . 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 HYPERLIPIDEMIA 06/21/2009  . HYPERTENSION 06/21/2009  . 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 FEVER UNSPECIFIED 06/21/2009  . NAUSEA 06/21/2009  . DYSPHAGIA 06/21/2009  . RUQ PAIN 06/21/2009  . HIATAL HERNIA 01/07/2008  . HYPOTHYROIDISM 11/22/2007  . ANXIETY 11/22/2007  . DEPRESSION 11/22/2007  . HYPERTENSIVE CARDIOVASCULAR DISEASE 11/22/2007  . 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 RENAL CALCULUS 11/22/2007  . DEGENERATIVE JOINT DISEASE 11/22/2007  . SLEEP APNEA 11/22/2007  . HEADACHE, CHRONIC 11/22/2007  . GASTRITIS 10/23/2007  . DIVERTICULOSIS, COLON 10/23/2007  . Diabetes ([place]) 07/16/2007  . HYPERCHOLESTEROLEMIA 07/16/2007  . OBESITY, MODERATE 07/16/2007  . ISCHEMIC HEART DISEASE 07/16/2007  . ESOPHAGEAL REFLUX 07/16/2007  . SCLERODERMA 07/16/2007  . DYSPNEA 07/16/2007  . COUGH, CHRONIC 07/16/2007    Past Medical History:  Diagnosis Date  . Diabetes mellitus without complication ([place])   . GERD (gastroesophageal reflux disease)   . Gout   . Hypertension    . 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 Thyroid disease     Social History   Social History  . Marital status: Divorced    Spouse name: N/A  . Number of children: N/A  . Years of education: N/A   Occupational History  . Not on file.   Social History Main Topics  . Smoking status: Never Smoker  . Smokeless tobacco: Never Used  . Alcohol use No  . Drug use: No  . Sexual activity: Not on file   Other Topics Concern  . Not on file   Social History Narrative  . No narrative on file    Outpatient Encounter Prescriptions as of 06/06/2017  Medication Sig Note  . allopurinol (ZYLOPRIM) 100 MG tablet TAKE 1 TABLET BY MOUTH EVERY DAY   . atorvastatin (LIPITOR) 10 MG tablet TAKE 1 TABLET BY MOUTH EVERY DAY AT BEDTIME   . colchicine 0.6 MG tablet Take 0.6 mg by mouth daily.    . Continuous Blood Gluc Sensor (FREESTYLE LIBRE SENSOR SYSTEM) MISC USE AS DIRECTED   . Continuous Blood Gluc Sensor (FREESTYLE LIBRE SENSOR SYSTEM) MISC Check sugar twice daily DX E11.9   . furosemide (LASIX) 40 MG tablet TAKE 1 TABLET BY MOUTH EVERY DAY   . gabapentin (NEURONTIN) 400 MG capsule Take 1 capsule (400 mg total) by mouth 3 (three) times daily.   Marland Kitchen glimepiride (AMARYL) 4 MG tablet Take 1 tablet (4 mg total) by mouth 2 (two) times daily.   Marland Kitchen glucose blood test strip Check blood sugar daily. Use as instructed   .  JARDIANCE 25 MG TABS tablet TAKE 25 MG BY MOUTH DAILY.   Marland Kitchen levothyroxine (SYNTHROID, LEVOTHROID) 88 MCG tablet TAKE 1 TABLET BY MOUTH EVERY DAY   . losartan (COZAAR) 50 MG tablet Take 1 tablet (50 mg total) by mouth daily.   . meclizine (ANTIVERT) 25 MG tablet Take 1 tablet (25 mg total) by mouth 3 (three) times daily as needed for dizziness.   . metFORMIN (GLUCOPHAGE) 1000 MG tablet TAKE 1 TABLET BY MOUTH TWICE A DAY   . nystatin cream (MYCOSTATIN) Apply 1 application topically 2 (two) times daily.   Marland Kitchen omeprazole (PRILOSEC) 20 MG capsule Take by mouth daily.  06/28/2015: Received from:  [facility]  . ondansetron (ZOFRAN-ODT) 4 MG disintegrating tablet Take 4 mg by mouth every 8 (eight) hours as needed for nausea or vomiting.   . ONE TOUCH LANCETS MISC 1 each by Does not apply route daily.   . pioglitazone (ACTOS) 15 MG tablet TAKE 1 TABLET (15 MG TOTAL) BY MOUTH DAILY.   . [DISCONTINUED] gabapentin (NEURONTIN) 300 MG capsule Take by mouth.   . [DISCONTINUED] meloxicam (MOBIC) 15 MG tablet Take 1 tablet (15 mg total) by mouth daily.    No facility-administered encounter medications on file as of 06/06/2017.     Allergies  Allergen Reactions  . Bacitracin-Neomycin-Polymyxin   . Clarithromycin Other (See Comments), Nausea Only and Nausea And Vomiting  . Codeine   . Dilaudid  [Hydromorphone Hcl] Nausea And Vomiting  . Hydromorphone Other (See Comments), Nausea Only and Nausea And Vomiting  . Iodine   . Iohexol      Desc: PT HAS HIVES/ITCHING   . Morphine   . Neomycin-Bacitracin Zn-Polymyx   . Oseltamivir Other (See Comments) and Nausea And Vomiting  . Tamiflu  [Oseltamivir Phosphate]     Other reaction(s): Abdominal Pain, Vomiting  . Zolpidem Other (See Comments)  . Zolpidem Tartrate   . Benzalkonium Chloride Itching, Rash and Swelling  . Lidocaine Hcl Itching, Rash and Swelling  . Tape Rash    Review of Systems  Constitutional: Negative.   Eyes: Negative.   Respiratory: Positive for shortness of breath. Negative for cough and wheezing.   Cardiovascular: Negative.   Gastrointestinal: Negative.   Musculoskeletal: Positive for back pain, joint pain and neck pain.  Skin: Negative.   Neurological:       Numbness feet and hands.  Endo/Heme/Allergies: Negative.   Psychiatric/Behavioral: Negative.     Objective:  BP 136/78   Pulse 62   Temp 98.4 F (36.9 C)   Resp 16   Wt 199 lb 6.4 oz (90.4 kg)   BMI 36.47 kg/m   Physical Exam  Constitutional: She is oriented to person, place, and time and well-developed, well-nourished, and in no  distress.  HENT:  Head: Normocephalic and atraumatic.  Right Ear: External ear normal.  Left Ear: External ear normal.  Nose: Nose normal.  Eyes: Pupils are equal, round, and reactive to light. Conjunctivae are normal.  Neck: Normal range of motion. Neck supple. No thyromegaly present.  Cardiovascular: Normal rate, regular rhythm, normal heart sounds and intact distal pulses.  Exam reveals no gallop.   No murmur heard. Pulmonary/Chest: Effort normal and breath sounds normal. No respiratory distress. She has no wheezes.  Neurological: She is alert and oriented to person, place, and time. Gait normal. GCS score is 15.  Skin: Skin is warm and dry.  Psychiatric: Mood, memory, affect and judgment normal.   Diabetic Foot Exam - Simple  Simple Foot Form Diabetic Foot exam was performed with the following findings:  Yes 06/06/2017  8:44 AM  Visual Inspection No deformities, no ulcerations, no other skin breakdown bilaterally:  Yes Sensation Testing See comments:  Yes Pulse Check Posterior Tibialis and Dorsalis pulse intact bilaterally:  Yes Comments Decreased sensation on the right and normal on the left.    Assessment and Plan :  1. Type 2 diabetes mellitus without complication, without long-term current use of insulin (HCC) 8.6. Worse. Increase Actos to 30 mg. Work on habits. Pt has not been paying attention to eating at all. - POCT HgB A1C - POCT UA - Microalbumin  2. Essential hypertension Stable.  3. HYPERCHOLESTEROLEMIA  4. Gout, unspecified cause, unspecified chronicity, unspecified site Stable on current regimen.  5. Need for influenza vaccination - Flu vaccine HIGH DOSE PF (Fluzone High dose)  - pioglitazone (ACTOS) 30 MG tablet; Take 1 tablet (30 mg total) by mouth daily.  Dispense: 90 tablet; Refill: 3  HPI, Exam and A&P transcribed by Tiffany Kocher, RMA under direction and in the presence of Miguel Aschoff, MD. I have done the exam and reviewed the chart and  it is accurate to the best of my knowledge. Development worker, community has been used and  any errors in dictation or transcription are unintentional. Miguel Aschoff M.D. [facility]

## 2017-06-14 ENCOUNTER — Other Ambulatory Visit: Payer: Self-pay | Admitting: Family Medicine

## 2017-07-19 ENCOUNTER — Other Ambulatory Visit: Payer: Self-pay | Admitting: Family Medicine

## 2017-07-19 DIAGNOSIS — E118 Type 2 diabetes mellitus with unspecified complications: Secondary | ICD-10-CM

## 2017-07-19 NOTE — Telephone Encounter (Signed)
[facility] sent fax requesting refill on pioglitazone (ACTOS) 30 MG tablet 90 day supply.

## 2017-07-20 ENCOUNTER — Other Ambulatory Visit: Payer: Self-pay

## 2017-07-20 DIAGNOSIS — E118 Type 2 diabetes mellitus with unspecified complications: Secondary | ICD-10-CM

## 2017-07-20 MED ORDER — PIOGLITAZONE HCL 30 MG PO TABS: 30.0000 mg | ORAL_TABLET | Freq: Every day | ORAL | 3 refills | Status: DC

## 2017-07-22 ENCOUNTER — Other Ambulatory Visit: Payer: Self-pay | Admitting: Family Medicine

## 2017-07-23 NOTE — Telephone Encounter (Signed)
Ok--3 rf--[id number]

## 2017-08-17 DIAGNOSIS — M7062 Trochanteric bursitis, left hip: Secondary | ICD-10-CM | POA: Diagnosis not present

## 2017-08-17 DIAGNOSIS — M25552 Pain in left hip: Secondary | ICD-10-CM | POA: Diagnosis not present

## 2017-08-22 ENCOUNTER — Ambulatory Visit
Admission: RE | Admit: 2017-08-22 | Discharge: 2017-08-22 | Disposition: A | Discharge status: Home / Self Care | Payer: PPO | Source: Ambulatory Visit | Attending: Family Medicine | Admitting: Family Medicine

## 2017-08-22 ENCOUNTER — Ambulatory Visit (INDEPENDENT_AMBULATORY_CARE_PROVIDER_SITE_OTHER): Payer: PPO | Admitting: Family Medicine

## 2017-08-22 VITALS — BP 132/76 | HR 77 | Temp 99.6°F | Resp 18

## 2017-08-22 DIAGNOSIS — R509 Fever, unspecified: Secondary | ICD-10-CM

## 2017-08-22 DIAGNOSIS — I517 Cardiomegaly: Secondary | ICD-10-CM | POA: Insufficient documentation

## 2017-08-22 DIAGNOSIS — J189 Pneumonia, unspecified organism: Secondary | ICD-10-CM

## 2017-08-22 DIAGNOSIS — J984 Other disorders of lung: Secondary | ICD-10-CM | POA: Insufficient documentation

## 2017-08-22 DIAGNOSIS — I700 Atherosclerosis of aorta: Secondary | ICD-10-CM | POA: Insufficient documentation

## 2017-08-22 DIAGNOSIS — R05 Cough: Secondary | ICD-10-CM

## 2017-08-22 DIAGNOSIS — R059 Cough, unspecified: Secondary | ICD-10-CM

## 2017-08-22 LAB — POCT INFLUENZA A/B
Influenza A, POC: NEGATIVE
Influenza B, POC: NEGATIVE

## 2017-08-22 MED ORDER — DOXYCYCLINE HYCLATE 100 MG PO TABS: 100.0000 mg | ORAL_TABLET | Freq: Two times a day (BID) | ORAL | 0 refills | Status: DC

## 2017-08-22 NOTE — Patient Instructions (Signed)
Take generic Robitussin DM over the counter

## 2017-08-22 NOTE — Progress Notes (Signed)
Teresa [name]  MRN: [id number] DOB: 1946-03-10  Subjective:  HPI  Patient is here due to not feeling well. Symptom started suddenly yesterday 08/21/17, symptoms are sore throat, head congestion, runny nose, sneezing, chills, fever 101 today when patient checked, body aches, cough-with yellow/greenish phlegm coming up, fatigue. Patient just took Tylenol 2 tablets before coming here. No other medications for the symptoms.  Patient Active Problem List   Diagnosis Date Noted  . 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 Microcalcifications of the breast 12/15/2015  . Back pain, chronic 06/28/2015  . Chronic airway obstruction ([place]) 06/28/2015  . Panlobular emphysema ([place]) 09/10/2014  . HYPERLIPIDEMIA 06/21/2009  . HYPERTENSION 06/21/2009  . 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 FEVER UNSPECIFIED 06/21/2009  . NAUSEA 06/21/2009  . DYSPHAGIA 06/21/2009  . RUQ PAIN 06/21/2009  . HIATAL HERNIA 01/07/2008  . HYPOTHYROIDISM 11/22/2007  . ANXIETY 11/22/2007  . DEPRESSION 11/22/2007  . HYPERTENSIVE CARDIOVASCULAR DISEASE 11/22/2007  . 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 RENAL CALCULUS 11/22/2007  . DEGENERATIVE JOINT DISEASE 11/22/2007  . SLEEP APNEA 11/22/2007  . HEADACHE, CHRONIC 11/22/2007  . GASTRITIS 10/23/2007  . DIVERTICULOSIS, COLON 10/23/2007  . Diabetes ([place]) 07/16/2007  . HYPERCHOLESTEROLEMIA 07/16/2007  . OBESITY, MODERATE 07/16/2007  . ISCHEMIC HEART DISEASE 07/16/2007  . ESOPHAGEAL REFLUX 07/16/2007  . SCLERODERMA 07/16/2007  . DYSPNEA 07/16/2007  . COUGH, CHRONIC 07/16/2007    Past Medical History:  Diagnosis Date  . Diabetes mellitus without complication ([place])   . GERD (gastroesophageal reflux disease)   . Gout   . Hypertension   . 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 Thyroid disease     Social History   Socioeconomic History  . Marital status: Divorced    Spouse name: Not on file  . Number of children: Not on file  . Years of  education: Not on file  . Highest education level: Not on file  Social Needs  . Financial resource strain: Not on file  . Food insecurity - worry: Not on file  . Food insecurity - inability: Not on file  . Transportation needs - medical: Not on file  . Transportation needs - non-medical: Not on file  Occupational History  . Not on file  Tobacco Use  . Smoking status: Never Smoker  . Smokeless tobacco: Never Used  Substance and Sexual Activity  . Alcohol use: No    Alcohol/week: 0.0 oz  . Drug use: No  . Sexual activity: Not on file  Other Topics Concern  . Not on file  Social History Narrative  . Not on file    Outpatient Encounter Medications as of 08/22/2017  Medication Sig Note  . allopurinol (ZYLOPRIM) 100 MG tablet TAKE 1 TABLET BY MOUTH EVERY DAY   . atorvastatin (LIPITOR) 10 MG tablet TAKE 1 TABLET BY MOUTH EVERY DAY AT BEDTIME   . colchicine 0.6 MG tablet Take 0.6 mg by mouth daily.    . Continuous Blood Gluc Sensor (FREESTYLE LIBRE SENSOR SYSTEM) MISC USE AS DIRECTED   . Continuous Blood Gluc Sensor (FREESTYLE LIBRE SENSOR SYSTEM) MISC Check sugar twice daily DX E11.9   . furosemide (LASIX) 40 MG tablet TAKE 1 TABLET BY MOUTH EVERY DAY   . gabapentin (NEURONTIN) 400 MG capsule Take 1 capsule (400 mg total) by mouth 3 (three) times daily.   Marland Kitchen glimepiride (AMARYL) 4 MG tablet TAKE 1 TABLET (4 MG TOTAL) BY MOUTH 2 (TWO) TIMES DAILY.   Marland Kitchen glucose blood test strip  Check blood sugar daily. Use as instructed   . JARDIANCE 25 MG TABS tablet TAKE 25 MG BY MOUTH DAILY.   Marland Kitchen levothyroxine (SYNTHROID, LEVOTHROID) 88 MCG tablet TAKE 1 TABLET BY MOUTH EVERY DAY   . losartan (COZAAR) 50 MG tablet Take 1 tablet (50 mg total) by mouth daily.   . meclizine (ANTIVERT) 25 MG tablet Take 1 tablet (25 mg total) by mouth 3 (three) times daily as needed for dizziness.   . metFORMIN (GLUCOPHAGE) 1000 MG tablet TAKE 1 TABLET BY MOUTH TWICE A DAY   . nystatin cream (MYCOSTATIN) Apply 1 application  topically 2 (two) times daily.   Marland Kitchen omeprazole (PRILOSEC) 20 MG capsule Take by mouth daily.  06/28/2015: Received from: [facility]  . ondansetron (ZOFRAN-ODT) 4 MG disintegrating tablet Take 4 mg by mouth every 8 (eight) hours as needed for nausea or vomiting.   . ONE TOUCH LANCETS MISC 1 each by Does not apply route daily.   . pioglitazone (ACTOS) 30 MG tablet Take 1 tablet (30 mg total) by mouth daily.    No facility-administered encounter medications on file as of 08/22/2017.     Allergies  Allergen Reactions  . Bacitracin-Neomycin-Polymyxin   . Clarithromycin Other (See Comments), Nausea Only and Nausea And Vomiting  . Codeine     Other reaction(s): Hallucination  . Dilaudid  [Hydromorphone Hcl] Nausea And Vomiting  . Hydromorphone Other (See Comments), Nausea Only and Nausea And Vomiting  . Iodine   . Iohexol      Desc: PT HAS HIVES/ITCHING   . Neomycin-Bacitracin Zn-Polymyx   . Oseltamivir Other (See Comments) and Nausea And Vomiting  . Oseltamivir Phosphate     Other reaction(s): Vomiting Other reaction(s): Abdominal Pain, Vomiting  . Tamiflu  [Oseltamivir Phosphate]     Other reaction(s): Abdominal Pain, Vomiting  . Zolpidem Other (See Comments)  . Zolpidem Tartrate   . Benzalkonium Chloride Itching, Rash and Swelling  . Lidocaine Hcl Itching, Rash and Swelling  . Morphine Nausea Only, Rash and Nausea And Vomiting  . Tape Rash  . Tapentadol Rash    Review of Systems  Constitutional: Positive for chills, fever and malaise/fatigue.  HENT: Positive for congestion, sinus pain and sore throat.   Eyes: Negative.   Respiratory: Positive for cough, sputum production and shortness of breath.   Cardiovascular: Negative.   Gastrointestinal: Positive for vomiting.  Musculoskeletal: Positive for back pain, joint pain, myalgias and neck pain. Negative for falls.  Skin: Negative.   Neurological: Positive for dizziness, weakness and headaches.    Endo/Heme/Allergies: Negative.   Psychiatric/Behavioral: Negative.     Objective:  BP 132/76   Pulse 77   Temp 99.6 F (37.6 C)   Resp 18   SpO2 95%   Physical Exam  Constitutional: She is oriented to person, place, and time and well-developed, well-nourished, and in no distress. She has a sickly appearance.  HENT:  Head: Normocephalic and atraumatic.  Right Ear: External ear normal.  Left Ear: External ear normal.  Mouth/Throat: Oropharynx is clear and moist.  Eyes: Conjunctivae are normal. Pupils are equal, round, and reactive to light.  Cardiovascular: Normal rate, regular rhythm, normal heart sounds and intact distal pulses. Exam reveals no gallop.  No murmur heard. Pulmonary/Chest: Effort normal and breath sounds normal.  Crackles in the right base present.  Abdominal: Soft.  Neurological: She is alert and oriented to person, place, and time. Gait abnormal.  Skin: Skin is warm and dry.  Psychiatric: Mood, memory, affect  and judgment normal.    Assessment and Plan :  1. Walking pneumonia Will treat as walking pneumonia. Treat with Doxy. Get chest xray done. If patient was not allergic to Tamiflu I would start her on this also but she is. Follow as needed. Out of work till Saturday 08/25/17, if patient is still not well to call us back and will extend her out of work longer at that time. - DG Chest 2 View; Future - doxycycline (VIBRA-TABS) 100 MG tablet; Take 1 tablet (100 mg total) by mouth 2 (two) times daily.  Dispense: 14 tablet; Refill: 0  2. Fever and chills - POCT Influenza A/B-negative - DG Chest 2 View; Future - doxycycline (VIBRA-TABS) 100 MG tablet; Take 1 tablet (100 mg total) by mouth 2 (two) times daily.  Dispense: 14 tablet; Refill: 0  3. Cough Try Robitussin DM. Patient is allergic to codeine. - DG Chest 2 View; Future - doxycycline (VIBRA-TABS) 100 MG tablet; Take 1 tablet (100 mg total) by mouth 2 (two) times daily.  Dispense: 14 tablet; Refill:  0 [id number]  HPI, Exam and A&P transcribed by Tiffany Kocher, RMA under direction and in the presence of Miguel Aschoff, MD. I have done the exam and reviewed the chart and it is accurate to the best of my knowledge. Development worker, community has been used and  any errors in dictation or transcription are unintentional. Miguel Aschoff M.D. [facility]

## 2017-08-27 ENCOUNTER — Encounter: Payer: Self-pay | Admitting: Family Medicine

## 2017-08-30 ENCOUNTER — Telehealth: Payer: Self-pay | Admitting: Family Medicine

## 2017-08-30 DIAGNOSIS — B3731 Acute candidiasis of vulva and vagina: Secondary | ICD-10-CM

## 2017-08-30 DIAGNOSIS — B373 Candidiasis of vulva and vagina: Secondary | ICD-10-CM

## 2017-08-30 MED ORDER — FLUCONAZOLE 150 MG PO TABS: 150.0000 mg | ORAL_TABLET | Freq: Every day | ORAL | 0 refills | Status: DC

## 2017-08-30 NOTE — Telephone Encounter (Signed)
Patient states she just came off antibiotic and she has a yeast infection.  Can you please call her in [facility] [place]?

## 2017-08-30 NOTE — Telephone Encounter (Signed)
Med sent. LM

## 2017-08-30 NOTE — Telephone Encounter (Signed)
150 mg daily for 2 doses.

## 2017-08-31 NOTE — Telephone Encounter (Signed)
Pt advised as below on her voicemail-Tasha Jindra V Legacie Dillingham, RMA

## 2017-10-04 ENCOUNTER — Encounter: Payer: Self-pay | Admitting: Family Medicine

## 2017-10-04 ENCOUNTER — Ambulatory Visit (INDEPENDENT_AMBULATORY_CARE_PROVIDER_SITE_OTHER): Payer: PPO | Admitting: Family Medicine

## 2017-10-04 VITALS — BP 122/64 | HR 68 | Temp 97.8°F | Resp 16 | Wt 196.0 lb

## 2017-10-04 DIAGNOSIS — B372 Candidiasis of skin and nail: Secondary | ICD-10-CM

## 2017-10-04 DIAGNOSIS — E118 Type 2 diabetes mellitus with unspecified complications: Secondary | ICD-10-CM

## 2017-10-04 LAB — POCT GLYCOSYLATED HEMOGLOBIN (HGB A1C): Hemoglobin A1C: 9.8

## 2017-10-04 MED ORDER — FREESTYLE LIBRE SENSOR SYSTEM MISC: 1.0000 | 12 refills | Status: DC

## 2017-10-04 MED ORDER — NYSTATIN 100000 UNIT/GM EX CREA: 1.0000 "application " | TOPICAL_CREAM | Freq: Two times a day (BID) | CUTANEOUS | 0 refills | Status: DC

## 2017-10-04 NOTE — Progress Notes (Signed)
Patient: Teresa [name] Female    DOB: April 03, 1946   72 y.o.   MRN: [id number] Visit Date: 10/04/2017  Today's Provider: Wilhemena Durie, MD   Chief Complaint  Patient presents with  . Diabetes  . Hypertension   Subjective:    HPI  Pt needs a refill on her nystatin cream   Diabetes Mellitus Type II, Follow-up:   Lab Results  Component Value Date   HGBA1C 8.6 06/06/2017   HGBA1C 8.1 01/23/2017   HGBA1C 8.2 10/03/2016    Last seen for diabetes 4 months ago.  Management since then includes none. She reports good compliance with treatment. She is not having side effects.  Home blood sugar records: 150's but pt reports that it has been as high as 250-300  Episodes of hypoglycemia? A couple about a month ago.   Current Insulin Regimen: n/a  Pertinent Labs:    Component Value Date/Time   CHOL 143 01/23/2017 0944   CHOL 200 04/04/2013 1047   TRIG 201 (H) 01/23/2017 0944   TRIG 191 04/04/2013 1047   HDL 44 01/23/2017 0944   HDL 45 04/04/2013 1047   LDLCALC 59 01/23/2017 0944   LDLCALC 117 (H) 04/04/2013 1047   CREATININE 0.71 01/23/2017 0944   CREATININE 0.73 05/14/2014 0956    Wt Readings from Last 3 Encounters:  10/04/17 196 lb (88.9 kg)  06/06/17 199 lb 6.4 oz (90.4 kg)  01/23/17 195 lb (88.5 kg)    ------------------------------------------------------------------------  Hypertension, follow-up:  BP Readings from Last 3 Encounters:  10/04/17 122/64  08/22/17 132/76  06/06/17 136/78    She was last seen for hypertension 4 months ago.  BP at that visit was 132/76. Management since that visit includes none. She reports good compliance with treatment. She is not having side effects.  She is not exercising. She is adherent to low salt diet.   Outside blood pressures are 130's/60's. She is experiencing dyspnea. This is not new for pt.  Patient denies chest pain, chest pressure/discomfort, claudication, exertional chest pressure/discomfort,  fatigue, irregular heart beat, lower extremity edema, near-syncope, orthopnea, palpitations, paroxysmal nocturnal dyspnea, syncope and tachypnea.   Wt Readings from Last 3 Encounters:  10/04/17 196 lb (88.9 kg)  06/06/17 199 lb 6.4 oz (90.4 kg)  01/23/17 195 lb (88.5 kg)   ------------------------------------------------------------------------      Allergies  Allergen Reactions  . Bacitracin-Neomycin-Polymyxin   . Clarithromycin Other (See Comments), Nausea Only and Nausea And Vomiting  . Codeine     Other reaction(s): Hallucination  . Dilaudid  [Hydromorphone Hcl] Nausea And Vomiting  . Hydromorphone Other (See Comments), Nausea Only and Nausea And Vomiting  . Iodine   . Iohexol      Desc: PT HAS HIVES/ITCHING   . Neomycin-Bacitracin Zn-Polymyx   . Oseltamivir Other (See Comments) and Nausea And Vomiting  . Oseltamivir Phosphate     Other reaction(s): Vomiting Other reaction(s): Abdominal Pain, Vomiting  . Tamiflu  [Oseltamivir Phosphate]     Other reaction(s): Abdominal Pain, Vomiting  . Zolpidem Other (See Comments)  . Zolpidem Tartrate   . Benzalkonium Chloride Itching, Rash and Swelling  . Lidocaine Hcl Itching, Rash and Swelling  . Morphine Nausea Only, Rash and Nausea And Vomiting  . Tape Rash  . Tapentadol Rash     Current Outpatient Medications:  .  allopurinol (ZYLOPRIM) 100 MG tablet, TAKE 1 TABLET BY MOUTH EVERY DAY, Disp: 90 tablet, Rfl: 3 .  atorvastatin (LIPITOR) 10 MG tablet,  TAKE 1 TABLET BY MOUTH EVERY DAY AT BEDTIME, Disp: 90 tablet, Rfl: 3 .  colchicine 0.6 MG tablet, Take 0.6 mg by mouth daily. , Disp: , Rfl:  .  Continuous Blood Gluc Sensor (FREESTYLE LIBRE SENSOR SYSTEM) MISC, USE AS DIRECTED, Disp: 1 each, Rfl: 12 .  Continuous Blood Gluc Sensor (FREESTYLE LIBRE SENSOR SYSTEM) MISC, Check sugar twice daily DX E11.9, Disp: 1 each, Rfl: 12 .  furosemide (LASIX) 40 MG tablet, TAKE 1 TABLET BY MOUTH EVERY DAY, Disp: 90 tablet, Rfl: 3 .  gabapentin  (NEURONTIN) 400 MG capsule, Take 1 capsule (400 mg total) by mouth 3 (three) times daily., Disp: 270 capsule, Rfl: 3 .  glimepiride (AMARYL) 4 MG tablet, TAKE 1 TABLET (4 MG TOTAL) BY MOUTH 2 (TWO) TIMES DAILY., Disp: 180 tablet, Rfl: 3 .  glucose blood test strip, Check blood sugar daily. Use as instructed, Disp: 100 each, Rfl: 12 .  JARDIANCE 25 MG TABS tablet, TAKE 25 MG BY MOUTH DAILY., Disp: 90 tablet, Rfl: 3 .  levothyroxine (SYNTHROID, LEVOTHROID) 88 MCG tablet, TAKE 1 TABLET BY MOUTH EVERY DAY, Disp: 90 tablet, Rfl: 3 .  losartan (COZAAR) 50 MG tablet, Take 1 tablet (50 mg total) by mouth daily., Disp: 30 tablet, Rfl: 12 .  meclizine (ANTIVERT) 25 MG tablet, Take 1 tablet (25 mg total) by mouth 3 (three) times daily as needed for dizziness., Disp: 30 tablet, Rfl: 0 .  metFORMIN (GLUCOPHAGE) 1000 MG tablet, TAKE 1 TABLET BY MOUTH TWICE A DAY, Disp: 180 tablet, Rfl: 3 .  nystatin cream (MYCOSTATIN), Apply 1 application topically 2 (two) times daily., Disp: 30 g, Rfl: 0 .  omeprazole (PRILOSEC) 20 MG capsule, Take by mouth daily. , Disp: , Rfl:  .  ONE TOUCH LANCETS MISC, 1 each by Does not apply route daily., Disp: 30 each, Rfl: 12 .  pioglitazone (ACTOS) 30 MG tablet, Take 1 tablet (30 mg total) by mouth daily., Disp: 90 tablet, Rfl: 3 .  doxycycline (VIBRA-TABS) 100 MG tablet, Take 1 tablet (100 mg total) by mouth 2 (two) times daily. (Patient not taking: Reported on 10/04/2017), Disp: 14 tablet, Rfl: 0 .  fluconazole (DIFLUCAN) 150 MG tablet, Take 1 tablet (150 mg total) by mouth daily. (Patient not taking: Reported on 10/04/2017), Disp: 2 tablet, Rfl: 0 .  ondansetron (ZOFRAN-ODT) 4 MG disintegrating tablet, Take 4 mg by mouth every 8 (eight) hours as needed for nausea or vomiting., Disp: , Rfl:   Review of Systems  Constitutional: Positive for fatigue.  HENT: Negative.   Eyes: Negative.   Respiratory: Positive for shortness of breath (nothing new for pt.).   Cardiovascular: Negative.     Gastrointestinal: Negative.   Endocrine: Negative.   Genitourinary: Negative.   Musculoskeletal: Negative.   Skin: Negative.   Allergic/Immunologic: Negative.   Neurological: Negative.   Hematological: Negative.   Psychiatric/Behavioral: Negative.     Social History   Tobacco Use  . Smoking status: Never Smoker  . Smokeless tobacco: Never Used  Substance Use Topics  . Alcohol use: No    Alcohol/week: 0.0 oz   Objective:   BP 122/64 (BP Location: Left Arm, Patient Position: Sitting, Cuff Size: Large)   Pulse 68   Temp 97.8 F (36.6 C) (Oral)   Resp 16   Wt 196 lb (88.9 kg)   BMI 35.85 kg/m  Vitals:   10/04/17 0821  BP: 122/64  Pulse: 68  Resp: 16  Temp: 97.8 F (36.6 C)  TempSrc: Oral  Weight: 196 lb (88.9 kg)     Physical Exam  Constitutional: She appears well-developed and well-nourished.  HENT:  Head: Normocephalic and atraumatic.  Eyes: Conjunctivae are normal.  Neck: No thyromegaly present.  Cardiovascular: Normal rate, regular rhythm and normal heart sounds.  Pulmonary/Chest: Effort normal.  Abdominal: Soft.  Musculoskeletal: She exhibits no edema.  Neurological: She is alert.  Skin: Skin is warm and dry.  Psychiatric: She has a normal mood and affect. Her behavior is normal. Judgment and thought content normal.        Assessment & Plan:     1. Type 2 diabetes mellitus with complication, unspecified whether long term insulin use ([place]) Finish Jardiance then start Ozempic .25 sq weekly.Check BS daily--RTC 2-3 weeks. - POCT HgB A1C--9.8 - Continuous Blood Gluc Sensor ([place]) MISC; 1 each by Other route See admin instructions. Pt requesting 14 day supply. Will also need new remote.  Dispense: 1 each; Refill: 12  2. Yeast dermatitis  - nystatin cream (MYCOSTATIN); Apply 1 application topically 2 (two) times daily.  Dispense: 30 g; Refill: 0     I have done the exam and reviewed the above chart and it is accurate to the  best of my knowledge. Development worker, community has been used in this note in any air is in the dictation or transcription are unintentional.  Wilhemena Durie, MD  [place]

## 2017-10-15 DIAGNOSIS — M7062 Trochanteric bursitis, left hip: Secondary | ICD-10-CM | POA: Diagnosis not present

## 2017-10-24 ENCOUNTER — Other Ambulatory Visit: Payer: Self-pay | Admitting: Family Medicine

## 2017-10-24 DIAGNOSIS — I10 Essential (primary) hypertension: Secondary | ICD-10-CM

## 2017-10-31 ENCOUNTER — Other Ambulatory Visit: Payer: Self-pay | Admitting: Emergency Medicine

## 2017-10-31 DIAGNOSIS — E118 Type 2 diabetes mellitus with unspecified complications: Secondary | ICD-10-CM

## 2017-10-31 MED ORDER — FREESTYLE LIBRE 14 DAY SENSOR MISC: 1.0000 | Freq: Once | 3 refills | Status: AC

## 2017-11-01 ENCOUNTER — Ambulatory Visit (INDEPENDENT_AMBULATORY_CARE_PROVIDER_SITE_OTHER): Payer: PPO | Admitting: Family Medicine

## 2017-11-01 ENCOUNTER — Ambulatory Visit: Payer: PPO | Admitting: Podiatry

## 2017-11-01 ENCOUNTER — Encounter: Payer: Self-pay | Admitting: Family Medicine

## 2017-11-01 VITALS — BP 124/80 | HR 62 | Temp 98.3°F | Resp 16 | Ht 62.0 in | Wt 199.0 lb

## 2017-11-01 DIAGNOSIS — Z6836 Body mass index (BMI) 36.0-36.9, adult: Secondary | ICD-10-CM

## 2017-11-01 DIAGNOSIS — I119 Hypertensive heart disease without heart failure: Secondary | ICD-10-CM | POA: Diagnosis not present

## 2017-11-01 DIAGNOSIS — E118 Type 2 diabetes mellitus with unspecified complications: Secondary | ICD-10-CM

## 2017-11-01 DIAGNOSIS — E7800 Pure hypercholesterolemia, unspecified: Secondary | ICD-10-CM | POA: Diagnosis not present

## 2017-11-01 LAB — POCT GLYCOSYLATED HEMOGLOBIN (HGB A1C): HEMOGLOBIN A1C: 9.7

## 2017-11-01 MED ORDER — SEMAGLUTIDE(0.25 OR 0.5MG/DOS) 2 MG/1.5ML ~~LOC~~ SOPN: 0.5000 | PEN_INJECTOR | SUBCUTANEOUS | 11 refills | Status: DC

## 2017-11-01 NOTE — Progress Notes (Signed)
Patient: Teresa [name] Female    DOB: [birth year]   72 y.o.   MRN: [id number] Visit Date: 11/01/2017  Today's Provider: Wilhemena Durie, MD   Chief Complaint  Patient presents with  . Follow-up  . Diabetes   Subjective:    HPI   Diabetes Mellitus Type II, Follow-up:   Lab Results  Component Value Date   HGBA1C 9.7 11/01/2017   HGBA1C 9.8 10/04/2017   HGBA1C 8.6 06/06/2017   Last seen for diabetes 1 months ago.  Management since then includes; advised to finish Jardiance then start Ozempic .25 sq weekly. Check BS daily and return in 2-3 weeks. She reports good compliance with treatment. She is not having side effects. none Current symptoms include none and have been unchanged. Home blood sugar records: fasting range: 135  Episodes of hypoglycemia? no   Current Insulin Regimen: n/a Most Recent Eye Exam: recently-[facility] Weight trend: stable Prior visit with dietician: no Current diet: in general, an "unhealthy" diet Current exercise: none  ----------------------------------------------------------------    Allergies  Allergen Reactions  . Bacitracin-Neomycin-Polymyxin   . Clarithromycin Other (See Comments), Nausea Only and Nausea And Vomiting  . Codeine     Other reaction(s): Hallucination  . Dilaudid  [Hydromorphone Hcl] Nausea And Vomiting  . Hydromorphone Other (See Comments), Nausea Only and Nausea And Vomiting  . Iodine   . Iohexol      Desc: PT HAS HIVES/ITCHING   . Neomycin-Bacitracin Zn-Polymyx   . Oseltamivir Other (See Comments) and Nausea And Vomiting  . Oseltamivir Phosphate     Other reaction(s): Vomiting Other reaction(s): Abdominal Pain, Vomiting  . Tamiflu  [Oseltamivir Phosphate]     Other reaction(s): Abdominal Pain, Vomiting  . Zolpidem Other (See Comments)  . Zolpidem Tartrate   . Benzalkonium Chloride Itching, Rash and Swelling  . Lidocaine Hcl Itching, Rash and Swelling  . Morphine Nausea Only, Rash and Nausea And  Vomiting  . Tape Rash  . Tapentadol Rash     Current Outpatient Medications:  .  allopurinol (ZYLOPRIM) 100 MG tablet, TAKE 1 TABLET BY MOUTH EVERY DAY, Disp: 90 tablet, Rfl: 3 .  atorvastatin (LIPITOR) 10 MG tablet, TAKE 1 TABLET BY MOUTH EVERY DAY AT BEDTIME, Disp: 90 tablet, Rfl: 3 .  colchicine 0.6 MG tablet, Take 0.6 mg by mouth daily. , Disp: , Rfl:  .  doxycycline (VIBRA-TABS) 100 MG tablet, Take 1 tablet (100 mg total) by mouth 2 (two) times daily., Disp: 14 tablet, Rfl: 0 .  fluconazole (DIFLUCAN) 150 MG tablet, Take 1 tablet (150 mg total) by mouth daily., Disp: 2 tablet, Rfl: 0 .  furosemide (LASIX) 40 MG tablet, TAKE 1 TABLET BY MOUTH EVERY DAY, Disp: 90 tablet, Rfl: 3 .  gabapentin (NEURONTIN) 400 MG capsule, Take 1 capsule (400 mg total) by mouth 3 (three) times daily., Disp: 270 capsule, Rfl: 3 .  glimepiride (AMARYL) 4 MG tablet, TAKE 1 TABLET (4 MG TOTAL) BY MOUTH 2 (TWO) TIMES DAILY., Disp: 180 tablet, Rfl: 3 .  glucose blood test strip, Check blood sugar daily. Use as instructed, Disp: 100 each, Rfl: 12 .  JARDIANCE 25 MG TABS tablet, TAKE 25 MG BY MOUTH DAILY., Disp: 90 tablet, Rfl: 3 .  levothyroxine (SYNTHROID, LEVOTHROID) 88 MCG tablet, TAKE 1 TABLET BY MOUTH EVERY DAY, Disp: 90 tablet, Rfl: 3 .  losartan (COZAAR) 50 MG tablet, TAKE 1 TABLET (50 MG TOTAL) BY MOUTH DAILY., Disp: 30 tablet, Rfl: 8 .  meclizine (ANTIVERT) 25 MG tablet, Take 1 tablet (25 mg total) by mouth 3 (three) times daily as needed for dizziness., Disp: 30 tablet, Rfl: 0 .  metFORMIN (GLUCOPHAGE) 1000 MG tablet, TAKE 1 TABLET BY MOUTH TWICE A DAY, Disp: 180 tablet, Rfl: 3 .  nystatin cream (MYCOSTATIN), Apply 1 application topically 2 (two) times daily., Disp: 30 g, Rfl: 0 .  omeprazole (PRILOSEC) 20 MG capsule, Take by mouth daily. , Disp: , Rfl:  .  ondansetron (ZOFRAN-ODT) 4 MG disintegrating tablet, Take 4 mg by mouth every 8 (eight) hours as needed for nausea or vomiting., Disp: , Rfl:  .  ONE  TOUCH LANCETS MISC, 1 each by Does not apply route daily., Disp: 30 each, Rfl: 12 .  pioglitazone (ACTOS) 30 MG tablet, Take 1 tablet (30 mg total) by mouth daily., Disp: 90 tablet, Rfl: 3  Review of Systems  Constitutional: Negative for appetite change, chills, fatigue and fever.  HENT: Negative.   Eyes: Negative.   Respiratory: Negative for chest tightness and shortness of breath.   Cardiovascular: Negative for chest pain and palpitations.  Gastrointestinal: Negative for abdominal pain, nausea and vomiting.  Endocrine: Negative.   Allergic/Immunologic: Negative.   Neurological: Negative for dizziness and weakness.  Psychiatric/Behavioral: Negative.     Social History   Tobacco Use  . Smoking status: Never Smoker  . Smokeless tobacco: Never Used  Substance Use Topics  . Alcohol use: No    Alcohol/week: 0.0 oz   Objective:   BP 124/80 (BP Location: Right Arm, Patient Position: Sitting, Cuff Size: Large)   Pulse 62   Temp 98.3 F (36.8 C) (Oral)   Resp 16   Ht 5\' 2"  (1.575 m)   Wt 199 lb (90.3 kg)   SpO2 97%   BMI 36.40 kg/m  Vitals:   11/01/17 0822  BP: 124/80  Pulse: 62  Resp: 16  Temp: 98.3 F (36.8 C)  TempSrc: Oral  SpO2: 97%  Weight: 199 lb (90.3 kg)  Height: 5\' 2"  (1.575 m)     Physical Exam  Constitutional: She is oriented to person, place, and time. She appears well-developed and well-nourished.  HENT:  Head: Normocephalic and atraumatic.  Eyes: Conjunctivae are normal. No scleral icterus.  Neck: No thyromegaly present.  Cardiovascular: Normal rate, regular rhythm and normal heart sounds.  Pulmonary/Chest: Effort normal and breath sounds normal.  Abdominal: Soft.  Neurological: She is alert and oriented to person, place, and time.  Skin: Skin is warm and dry.  Psychiatric: She has a normal mood and affect. Her behavior is normal. Judgment and thought content normal.        Assessment & Plan:     1. Type 2 diabetes mellitus with complication,  unspecified whether long term insulin use ([facility])  - POCT glycosylated hemoglobin (Hb A1C) 2.HTN 3.HLD     I have done the exam and reviewed the chart and it is accurate to the best of my knowledge. Development worker, community has been used and  any errors in dictation or transcription are unintentional. Miguel Aschoff M.D. [place], MD  [facility]

## 2017-11-01 NOTE — Patient Instructions (Addendum)
Type 2 diabetes mellitus with complication, unspecified whether long term insulin use (HCC) Given Ozempic 0.5 mg, 1.5 mL x11 refills. Return January 28 2018.

## 2017-11-02 ENCOUNTER — Other Ambulatory Visit: Payer: Self-pay

## 2017-11-02 MED ORDER — FREESTYLE LIBRE 14 DAY SENSOR MISC: 1.0000 "application " | Freq: Two times a day (BID) | 12 refills | Status: DC | PRN

## 2017-11-02 NOTE — Progress Notes (Signed)
Request from pharmacy for [facility]

## 2017-11-14 DIAGNOSIS — M7062 Trochanteric bursitis, left hip: Secondary | ICD-10-CM | POA: Diagnosis not present

## 2017-11-15 ENCOUNTER — Ambulatory Visit: Payer: PPO | Admitting: Podiatry

## 2017-11-19 ENCOUNTER — Encounter: Payer: Self-pay | Admitting: Podiatry

## 2017-11-19 ENCOUNTER — Ambulatory Visit: Payer: PPO | Admitting: Podiatry

## 2017-11-19 DIAGNOSIS — M79676 Pain in unspecified toe(s): Secondary | ICD-10-CM | POA: Diagnosis not present

## 2017-11-19 DIAGNOSIS — B351 Tinea unguium: Secondary | ICD-10-CM | POA: Diagnosis not present

## 2017-11-19 DIAGNOSIS — G629 Polyneuropathy, unspecified: Secondary | ICD-10-CM

## 2017-11-19 NOTE — Progress Notes (Signed)
Complaint:  Visit Type: Patient returns to my office for continued preventative foot care services. Complaint: Patient states" my nails have grown long and thick and become painful to walk and wear shoes" Patient has been diagnosed polyneuropathy  with no foot complications. The patient presents for preventative foot care services. No changes to ROS  Podiatric Exam: Vascular: dorsalis pedis and posterior tibial pulses are palpable bilateral. Capillary return is immediate. Temperature gradient is WNL. Skin turgor WNL  Sensorium: Normal Semmes Weinstein monofilament test. Normal tactile sensation bilaterally. Nail Exam: Pt has thick disfigured discolored nails with subungual debris noted bilateral entire nail hallux through fifth toenails Ulcer Exam: There is no evidence of ulcer or pre-ulcerative changes or infection. Orthopedic Exam: Muscle tone and strength are WNL. No limitations in general ROM. No crepitus or effusions noted. Foot type and digits show no abnormalities. Dorsal lipping 1st metatarsal  B/L.  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 Skin: No Porokeratosis. No infection or ulcers  Diagnosis:  Onychomycosis, , Pain in right toe, pain in left toes  Treatment & Plan Procedures and Treatment: Consent by patient was obtained for treatment procedures.   Debridement of mycotic and hypertrophic toenails, 1 through 5 bilateral and clearing of subungual debris. No ulceration, no infection noted.  Return Visit-Office Procedure: Patient instructed to return to the office for a follow up visit 3 months for continued evaluation and treatment.    Gardiner Barefoot DPM

## 2018-01-09 ENCOUNTER — Other Ambulatory Visit: Payer: Self-pay | Admitting: Family Medicine

## 2018-01-28 ENCOUNTER — Ambulatory Visit: Payer: Self-pay

## 2018-02-18 ENCOUNTER — Ambulatory Visit (INDEPENDENT_AMBULATORY_CARE_PROVIDER_SITE_OTHER): Payer: PPO | Admitting: Podiatry

## 2018-02-18 ENCOUNTER — Other Ambulatory Visit: Payer: Self-pay | Admitting: Podiatry

## 2018-02-18 ENCOUNTER — Encounter: Payer: Self-pay | Admitting: Podiatry

## 2018-02-18 ENCOUNTER — Ambulatory Visit (INDEPENDENT_AMBULATORY_CARE_PROVIDER_SITE_OTHER): Payer: PPO

## 2018-02-18 DIAGNOSIS — S93509A Unspecified sprain of unspecified toe(s), initial encounter: Secondary | ICD-10-CM | POA: Diagnosis not present

## 2018-02-18 DIAGNOSIS — M79676 Pain in unspecified toe(s): Secondary | ICD-10-CM

## 2018-02-18 DIAGNOSIS — S93505A Unspecified sprain of left lesser toe(s), initial encounter: Secondary | ICD-10-CM | POA: Diagnosis not present

## 2018-02-18 DIAGNOSIS — S99922A Unspecified injury of left foot, initial encounter: Secondary | ICD-10-CM

## 2018-02-18 DIAGNOSIS — B351 Tinea unguium: Secondary | ICD-10-CM

## 2018-02-18 NOTE — Progress Notes (Signed)
Complaint:  Visit Type: Patient returns to my office for continued preventative foot care services. Complaint: Patient states" my nails have grown long and thick and become painful to walk and wear shoes" Patient has been diagnosed polyneuropathy  with no foot complications. The patient presents for preventative foot care services. No changes to ROS.  Patient says she injured her toes 4 weeks ago and they still throb at times.  Podiatric Exam: Vascular: dorsalis pedis and posterior tibial pulses are palpable bilateral. Capillary return is immediate. Temperature gradient is WNL. Skin turgor WNL  Sensorium: Normal Semmes Weinstein monofilament test. Normal tactile sensation bilaterally. Nail Exam: Pt has thick disfigured discolored nails with subungual debris noted bilateral entire nail hallux through fifth toenails Ulcer Exam: There is no evidence of ulcer or pre-ulcerative changes or infection. Orthopedic Exam: Muscle tone and strength are WNL. No limitations in general ROM. No crepitus or effusions noted. Foot type and digits show no abnormalities. Dorsal lipping 1st metatarsal  B/L.  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 Skin: No Porokeratosis. No infection or ulcers  Diagnosis:  Onychomycosis, , Pain in right toe, pain in left toes  Toe sprain left 2-5 toes  Treatment & Plan Procedures and Treatment: Consent by patient was obtained for treatment procedures.   Debridement of mycotic and hypertrophic toenails, 1 through 5 bilateral and clearing of subungual debris. No ulceration, no infection noted. X-rays were taken and no evidence of bony pathology.  Narrowing of !st  MPJ left.  Patient was told there was no evidence of toe fracture and this should continue to heal. Return Visit-Office Procedure: Patient instructed to return to the office for a follow up visit 3 months for continued evaluation and treatment.    Gardiner Barefoot DPM

## 2018-03-20 ENCOUNTER — Other Ambulatory Visit: Payer: Self-pay | Admitting: Family Medicine

## 2018-03-20 DIAGNOSIS — E119 Type 2 diabetes mellitus without complications: Secondary | ICD-10-CM

## 2018-04-10 ENCOUNTER — Other Ambulatory Visit: Payer: Self-pay | Admitting: Family Medicine

## 2018-04-10 DIAGNOSIS — E118 Type 2 diabetes mellitus with unspecified complications: Secondary | ICD-10-CM

## 2018-04-19 ENCOUNTER — Other Ambulatory Visit: Payer: Self-pay | Admitting: Family Medicine

## 2018-05-07 ENCOUNTER — Encounter: Payer: PPO | Admitting: Family Medicine

## 2018-05-07 ENCOUNTER — Ambulatory Visit: Payer: PPO

## 2018-05-20 ENCOUNTER — Ambulatory Visit: Payer: PPO | Admitting: Podiatry

## 2018-05-20 ENCOUNTER — Ambulatory Visit (INDEPENDENT_AMBULATORY_CARE_PROVIDER_SITE_OTHER): Payer: PPO | Admitting: Family Medicine

## 2018-05-20 VITALS — BP 120/64 | HR 69 | Temp 97.9°F | Resp 16 | Wt 200.0 lb

## 2018-05-20 DIAGNOSIS — L570 Actinic keratosis: Secondary | ICD-10-CM

## 2018-05-20 DIAGNOSIS — E7800 Pure hypercholesterolemia, unspecified: Secondary | ICD-10-CM

## 2018-05-20 DIAGNOSIS — E119 Type 2 diabetes mellitus without complications: Secondary | ICD-10-CM

## 2018-05-20 DIAGNOSIS — Z23 Encounter for immunization: Secondary | ICD-10-CM

## 2018-05-20 DIAGNOSIS — E039 Hypothyroidism, unspecified: Secondary | ICD-10-CM | POA: Diagnosis not present

## 2018-05-20 DIAGNOSIS — I10 Essential (primary) hypertension: Secondary | ICD-10-CM | POA: Diagnosis not present

## 2018-05-20 DIAGNOSIS — M170 Bilateral primary osteoarthritis of knee: Secondary | ICD-10-CM

## 2018-05-20 DIAGNOSIS — E1140 Type 2 diabetes mellitus with diabetic neuropathy, unspecified: Secondary | ICD-10-CM

## 2018-05-20 NOTE — Progress Notes (Addendum)
Teresa [name]  MRN: [id number] DOB: July 21, 1946  Subjective:  HPI   The patient is a 72 year old female who presents for follow up of her diabetes.  She was last seen on 11/01/17.  Her A1C at that time was 9.7.  The patient checks her glucose at home and gets readings that range from 125-140.  She has chronic knee pain followed by orthopedics.  Hypercholesterolemia- Lab Results  Component Value Date   CHOL 143 01/23/2017   HDL 44 01/23/2017   LDLCALC 59 01/23/2017   TRIG 201 (H) 01/23/2017   Hypothyroidism Lab Results  Component Value Date   TSH 1.430 01/23/2017   Hypertension BP Readings from Last 3 Encounters:  05/20/18 120/64  11/01/17 124/80  10/04/17 122/64    Patient Active Problem List   Diagnosis Date Noted  . Polyneuropathy associated with underlying disease ([place]) 12/11/2016  . Microcalcifications of the breast 12/15/2015  . Back pain, chronic 06/28/2015  . Chronic airway obstruction ([place]) 06/28/2015  . Panlobular emphysema ([place]) 09/10/2014  . HYPERLIPIDEMIA 06/21/2009  . HYPERTENSION 06/21/2009  . 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 FEVER UNSPECIFIED 06/21/2009  . NAUSEA 06/21/2009  . DYSPHAGIA 06/21/2009  . RUQ PAIN 06/21/2009  . HIATAL HERNIA 01/07/2008  . HYPOTHYROIDISM 11/22/2007  . ANXIETY 11/22/2007  . DEPRESSION 11/22/2007  . Hypertensive heart disease without heart failure 11/22/2007  . 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 RENAL CALCULUS 11/22/2007  . DEGENERATIVE JOINT DISEASE 11/22/2007  . SLEEP APNEA 11/22/2007  . HEADACHE, CHRONIC 11/22/2007  . GASTRITIS 10/23/2007  . DIVERTICULOSIS, COLON 10/23/2007  . Diabetes ([place]) 07/16/2007  . HYPERCHOLESTEROLEMIA 07/16/2007  . OBESITY, MODERATE 07/16/2007  . ISCHEMIC HEART DISEASE 07/16/2007  . ESOPHAGEAL REFLUX 07/16/2007  . SCLERODERMA 07/16/2007  . DYSPNEA 07/16/2007  . COUGH, CHRONIC 07/16/2007    Past Medical History:  Diagnosis Date  . Diabetes mellitus without complication ([place])   .  GERD (gastroesophageal reflux disease)   . Gout   . Hypertension   . Scleroderma ([place])    hands  . Thyroid disease     Social History   Socioeconomic History  . Marital status: Divorced    Spouse name: Not on file  . Number of children: Not on file  . Years of education: Not on file  . Highest education level: Not on file  Occupational History  . Not on file  Social Needs  . Financial resource strain: Not on file  . Food insecurity:    Worry: Not on file    Inability: Not on file  . Transportation needs:    Medical: Not on file    Non-medical: Not on file  Tobacco Use  . Smoking status: Never Smoker  . Smokeless tobacco: Never Used  Substance and Sexual Activity  . Alcohol use: No    Alcohol/week: 0.0 standard drinks  . Drug use: No  . Sexual activity: Not on file  Lifestyle  . Physical activity:    Days per week: Not on file    Minutes per session: Not on file  . Stress: Not on file  Relationships  . Social connections:    Talks on phone: Not on file    Gets together: Not on file    Attends religious service: Not on file    Active member of club or organization: Not on file    Attends meetings of clubs or organizations: Not on file    Relationship status: Not on file  . Intimate partner violence:  Fear of current or ex partner: Not on file    Emotionally abused: Not on file    Physically abused: Not on file    Forced sexual activity: Not on file  Other Topics Concern  . Not on file  Social History Narrative  . Not on file    Outpatient Encounter Medications as of 05/20/2018  Medication Sig Note  . allopurinol (ZYLOPRIM) 100 MG tablet TAKE 1 TABLET BY MOUTH EVERY DAY   . atorvastatin (LIPITOR) 10 MG tablet TAKE 1 TABLET BY MOUTH EVERY DAY AT BEDTIME   . colchicine 0.6 MG tablet Take 0.6 mg by mouth daily.    . Continuous Blood Gluc Sensor (FREESTYLE LIBRE 14 DAY SENSOR) MISC 1 application by Does not apply route 2 (two) times daily as needed.   .  furosemide (LASIX) 40 MG tablet TAKE 1 TABLET BY MOUTH EVERY DAY   . gabapentin (NEURONTIN) 400 MG capsule Take 1 capsule (400 mg total) by mouth 3 (three) times daily.   Marland Kitchen glimepiride (AMARYL) 4 MG tablet TAKE 1 TABLET (4 MG TOTAL) BY MOUTH 2 (TWO) TIMES DAILY.   Marland Kitchen glucose blood test strip Check blood sugar daily. Use as instructed   . JARDIANCE 25 MG TABS tablet TAKE 25 MG BY MOUTH DAILY.   Marland Kitchen levothyroxine (SYNTHROID, LEVOTHROID) 88 MCG tablet TAKE 1 TABLET BY MOUTH EVERY DAY   . losartan (COZAAR) 50 MG tablet TAKE 1 TABLET (50 MG TOTAL) BY MOUTH DAILY.   . meclizine (ANTIVERT) 25 MG tablet Take 1 tablet (25 mg total) by mouth 3 (three) times daily as needed for dizziness.   . metFORMIN (GLUCOPHAGE) 1000 MG tablet TAKE 1 TABLET BY MOUTH TWICE A DAY   . nystatin cream (MYCOSTATIN) Apply 1 application topically 2 (two) times daily.   Marland Kitchen omeprazole (PRILOSEC) 20 MG capsule Take by mouth daily.  06/28/2015: Received from: [facility]  . ondansetron (ZOFRAN-ODT) 4 MG disintegrating tablet Take 4 mg by mouth every 8 (eight) hours as needed for nausea or vomiting.   . ONE TOUCH LANCETS MISC 1 each by Does not apply route daily.   . pioglitazone (ACTOS) 30 MG tablet Take 1 tablet (30 mg total) by mouth daily.   . Semaglutide (OZEMPIC) 0.25 or 0.5 MG/DOSE SOPN Inject 0.5 Syringes into the skin once a week.   . [DISCONTINUED] fluconazole (DIFLUCAN) 150 MG tablet Take 1 tablet (150 mg total) by mouth daily.   . [DISCONTINUED] pioglitazone (ACTOS) 15 MG tablet TAKE 1 TABLET (15 MG TOTAL) BY MOUTH DAILY.    No facility-administered encounter medications on file as of 05/20/2018.     Allergies  Allergen Reactions  . Bacitracin-Neomycin-Polymyxin   . Clarithromycin Other (See Comments), Nausea Only and Nausea And Vomiting  . Codeine     Other reaction(s): Hallucination  . Dilaudid  [Hydromorphone Hcl] Nausea And Vomiting  . Hydromorphone Other (See Comments), Nausea Only and Nausea  And Vomiting  . Iodine   . Iohexol      Desc: PT HAS HIVES/ITCHING   . Neomycin-Bacitracin Zn-Polymyx   . Oseltamivir Other (See Comments) and Nausea And Vomiting  . Oseltamivir Phosphate     Other reaction(s): Vomiting Other reaction(s): Abdominal Pain, Vomiting  . Tamiflu  [Oseltamivir Phosphate]     Other reaction(s): Abdominal Pain, Vomiting  . Zolpidem Other (See Comments)  . Zolpidem Tartrate   . Benzalkonium Chloride Itching, Rash and Swelling  . Lidocaine Hcl Itching, Rash and Swelling  . Morphine Nausea Only, Rash  and Nausea And Vomiting  . Tape Rash  . Tapentadol Rash    Review of Systems  Constitutional: Negative for fever and malaise/fatigue.  HENT: Negative.   Eyes: Negative.   Respiratory: Negative for cough, shortness of breath and wheezing.   Cardiovascular: Negative for chest pain, palpitations, orthopnea, claudication and leg swelling.  Gastrointestinal: Negative.   Musculoskeletal: Positive for joint pain.  Skin: Negative.   Neurological: Positive for dizziness (occasional) and headaches (occasional).  Endo/Heme/Allergies: Negative.   Psychiatric/Behavioral: Negative.     Objective:  BP 120/64 (BP Location: Right Arm, Patient Position: Sitting, Cuff Size: Normal)   Pulse 69   Temp 97.9 F (36.6 C) (Oral)   Resp 16   Wt 200 lb (90.7 kg)   SpO2 95%   BMI 36.58 kg/m   Physical Exam  Constitutional: She is oriented to person, place, and time and well-developed, well-nourished, and in no distress.  HENT:  Head: Normocephalic and atraumatic.  Right Ear: External ear normal.  Left Ear: External ear normal.  Nose: Nose normal.  Eyes: Conjunctivae are normal. No scleral icterus.  Neck: No thyromegaly present.  Cardiovascular: Normal rate, regular rhythm, normal heart sounds and intact distal pulses.  Pulmonary/Chest: Effort normal and breath sounds normal.  Abdominal: Soft.  Musculoskeletal: She exhibits no edema.  Neurological: She is alert and  oriented to person, place, and time. Gait normal. GCS score is 15.  Mild decrease sensation of both feet feet to monofilament exam.  Skin: Skin is warm and dry.  AKs noted.  Psychiatric: Mood, memory, affect and judgment normal.       Assessment and Plan :  1. Type 2 diabetes mellitus without complication, unspecified whether long term insulin use (HCC) Pt needs to work on diet control. - Comprehensive metabolic panel - Hemoglobin A1c  2. Acquired hypothyroidism  - TSH  3. Essential hypertension  - CBC with Differential/Platelet - Comprehensive metabolic panel - TSH  4. HYPERCHOLESTEROLEMIA  - Lipid Panel With LDL/HDL Ratio  5. Need for influenza vaccination  - Flu vaccine HIGH DOSE PF (Fluzone High dose)  6. Actinic keratosis  - Ambulatory referral to Dermatology 7.Knee OA Per Dr Novella Olive. 8.Diabetic neuropathy  I have done the exam and reviewed the chart and it is accurate to the best of my knowledge. Development worker, community has been used and  any errors in dictation or transcription are unintentional. Miguel Aschoff M.D. [facility]

## 2018-05-21 DIAGNOSIS — E039 Hypothyroidism, unspecified: Secondary | ICD-10-CM | POA: Diagnosis not present

## 2018-05-21 DIAGNOSIS — I10 Essential (primary) hypertension: Secondary | ICD-10-CM | POA: Diagnosis not present

## 2018-05-21 DIAGNOSIS — E119 Type 2 diabetes mellitus without complications: Secondary | ICD-10-CM | POA: Diagnosis not present

## 2018-05-21 DIAGNOSIS — E7800 Pure hypercholesterolemia, unspecified: Secondary | ICD-10-CM | POA: Diagnosis not present

## 2018-05-22 ENCOUNTER — Ambulatory Visit: Payer: PPO

## 2018-05-22 LAB — LIPID PANEL WITH LDL/HDL RATIO
CHOLESTEROL TOTAL: 186 mg/dL (ref 100–199)
HDL: 53 mg/dL (ref >39)
LDL Calculated: 91 mg/dL (ref 0–99)
LDl/HDL Ratio: 1.7 ratio (ref 0.0–3.2)
TRIGLYCERIDES: 211 mg/dL — AB (ref 0–149)
VLDL CHOLESTEROL CAL: 42 mg/dL — AB (ref 5–40)

## 2018-05-22 LAB — COMPREHENSIVE METABOLIC PANEL
ALT: 32 IU/L (ref 0–32)
AST: 31 IU/L (ref 0–40)
Albumin/Globulin Ratio: 1.7 (ref 1.2–2.2)
Albumin: 4.5 g/dL (ref 3.5–4.8)
Alkaline Phosphatase: 75 IU/L (ref 39–117)
BUN/Creatinine Ratio: 23 (ref 12–28)
BUN: 14 mg/dL (ref 8–27)
Bilirubin Total: 0.5 mg/dL (ref 0.0–1.2)
CALCIUM: 9.4 mg/dL (ref 8.7–10.3)
CO2: 21 mmol/L (ref 20–29)
CREATININE: 0.62 mg/dL (ref 0.57–1.00)
Chloride: 100 mmol/L (ref 96–106)
GFR, EST AFRICAN AMERICAN: 104 mL/min/{1.73_m2} (ref >59)
GFR, EST NON AFRICAN AMERICAN: 90 mL/min/{1.73_m2} (ref >59)
Globulin, Total: 2.6 g/dL (ref 1.5–4.5)
Glucose: 140 mg/dL — ABNORMAL HIGH (ref 65–99)
Potassium: 3.9 mmol/L (ref 3.5–5.2)
Sodium: 143 mmol/L (ref 134–144)
TOTAL PROTEIN: 7.1 g/dL (ref 6.0–8.5)

## 2018-05-22 LAB — CBC WITH DIFFERENTIAL/PLATELET
BASOS ABS: 0.1 10*3/uL (ref 0.0–0.2)
Basos: 1 %
EOS (ABSOLUTE): 0.2 10*3/uL (ref 0.0–0.4)
Eos: 3 %
Hematocrit: 45.8 % (ref 34.0–46.6)
Hemoglobin: 15.1 g/dL (ref 11.1–15.9)
Immature Grans (Abs): 0 10*3/uL (ref 0.0–0.1)
Immature Granulocytes: 0 %
LYMPHS: 43 %
Lymphocytes Absolute: 3 10*3/uL (ref 0.7–3.1)
MCH: 29 pg (ref 26.6–33.0)
MCHC: 33 g/dL (ref 31.5–35.7)
MCV: 88 fL (ref 79–97)
Monocytes Absolute: 0.4 10*3/uL (ref 0.1–0.9)
Monocytes: 6 %
Neutrophils Absolute: 3.2 10*3/uL (ref 1.4–7.0)
Neutrophils: 47 %
PLATELETS: 205 10*3/uL (ref 150–450)
RBC: 5.21 x10E6/uL (ref 3.77–5.28)
RDW: 14.2 % (ref 12.3–15.4)
WBC: 6.9 10*3/uL (ref 3.4–10.8)

## 2018-05-22 LAB — HEMOGLOBIN A1C
ESTIMATED AVERAGE GLUCOSE: 171 mg/dL
Hgb A1c MFr Bld: 7.6 % — ABNORMAL HIGH (ref 4.8–5.6)

## 2018-05-22 LAB — TSH: TSH: 1.2 u[IU]/mL (ref 0.450–4.500)

## 2018-05-27 ENCOUNTER — Encounter: Payer: Self-pay | Admitting: Podiatry

## 2018-05-27 ENCOUNTER — Ambulatory Visit: Payer: PPO | Admitting: Podiatry

## 2018-05-27 DIAGNOSIS — M79676 Pain in unspecified toe(s): Secondary | ICD-10-CM

## 2018-05-27 DIAGNOSIS — B351 Tinea unguium: Secondary | ICD-10-CM | POA: Diagnosis not present

## 2018-05-27 DIAGNOSIS — E1149 Type 2 diabetes mellitus with other diabetic neurological complication: Secondary | ICD-10-CM

## 2018-05-27 NOTE — Progress Notes (Signed)
Complaint:  Visit Type: Patient returns to my office for continued preventative foot care services. Complaint: Patient states" my nails have grown long and thick and become painful to walk and wear shoes" Patient has been diagnosed polyneuropathy  with diabetes with neuropathy.. The patient presents for preventative foot care services. No changes to ROS  Podiatric Exam: Vascular: dorsalis pedis and posterior tibial pulses are palpable bilateral. Capillary return is immediate. Temperature gradient is WNL. Skin turgor WNL  Sensorium: Normal Semmes Weinstein monofilament test. Normal tactile sensation bilaterally. Nail Exam: Pt has thick disfigured discolored nails with subungual debris noted bilateral entire nail hallux through fifth toenails Ulcer Exam: There is no evidence of ulcer or pre-ulcerative changes or infection. Orthopedic Exam: Muscle tone and strength are WNL. No limitations in general ROM. No crepitus or effusions noted. Foot type and digits show no abnormalities. Dorsal lipping 1st metatarsal  B/L.  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 Skin: No Porokeratosis. No infection or ulcers  Diagnosis:  Onychomycosis, , Pain in right toe, pain in left toes  Treatment & Plan Procedures and Treatment: Consent by patient was obtained for treatment procedures.   Debridement of mycotic and hypertrophic toenails, 1 through 5 bilateral and clearing of subungual debris. No ulceration, no infection noted.  Return Visit-Office Procedure: Patient instructed to return to the office for a follow up visit 3 months for continued evaluation and treatment.    Gardiner Barefoot DPM

## 2018-05-29 ENCOUNTER — Other Ambulatory Visit: Payer: Self-pay | Admitting: Family Medicine

## 2018-05-29 DIAGNOSIS — I10 Essential (primary) hypertension: Secondary | ICD-10-CM

## 2018-06-13 ENCOUNTER — Other Ambulatory Visit: Payer: Self-pay | Admitting: Family Medicine

## 2018-07-05 ENCOUNTER — Other Ambulatory Visit: Payer: Self-pay | Admitting: Family Medicine

## 2018-07-24 ENCOUNTER — Encounter: Payer: Self-pay | Admitting: Family Medicine

## 2018-07-24 DIAGNOSIS — H40039 Anatomical narrow angle, unspecified eye: Secondary | ICD-10-CM | POA: Diagnosis not present

## 2018-07-24 LAB — HM DIABETES EYE EXAM

## 2018-08-23 DIAGNOSIS — H2512 Age-related nuclear cataract, left eye: Secondary | ICD-10-CM | POA: Diagnosis not present

## 2018-08-26 ENCOUNTER — Ambulatory Visit (INDEPENDENT_AMBULATORY_CARE_PROVIDER_SITE_OTHER): Payer: PPO | Admitting: Podiatry

## 2018-08-26 ENCOUNTER — Encounter: Payer: Self-pay | Admitting: Podiatry

## 2018-08-26 DIAGNOSIS — D2372 Other benign neoplasm of skin of left lower limb, including hip: Secondary | ICD-10-CM | POA: Diagnosis not present

## 2018-08-26 DIAGNOSIS — B351 Tinea unguium: Secondary | ICD-10-CM

## 2018-08-26 DIAGNOSIS — D485 Neoplasm of uncertain behavior of skin: Secondary | ICD-10-CM | POA: Diagnosis not present

## 2018-08-26 DIAGNOSIS — D1800 Hemangioma unspecified site: Secondary | ICD-10-CM | POA: Diagnosis not present

## 2018-08-26 DIAGNOSIS — L812 Freckles: Secondary | ICD-10-CM | POA: Diagnosis not present

## 2018-08-26 DIAGNOSIS — M2020 Hallux rigidus, unspecified foot: Secondary | ICD-10-CM

## 2018-08-26 DIAGNOSIS — L821 Other seborrheic keratosis: Secondary | ICD-10-CM | POA: Diagnosis not present

## 2018-08-26 DIAGNOSIS — M205X9 Other deformities of toe(s) (acquired), unspecified foot: Secondary | ICD-10-CM

## 2018-08-26 DIAGNOSIS — M79676 Pain in unspecified toe(s): Secondary | ICD-10-CM

## 2018-08-26 DIAGNOSIS — E1149 Type 2 diabetes mellitus with other diabetic neurological complication: Secondary | ICD-10-CM

## 2018-08-26 DIAGNOSIS — G629 Polyneuropathy, unspecified: Secondary | ICD-10-CM

## 2018-08-26 DIAGNOSIS — I8393 Asymptomatic varicose veins of bilateral lower extremities: Secondary | ICD-10-CM | POA: Diagnosis not present

## 2018-08-26 NOTE — Progress Notes (Signed)
Complaint:  Visit Type: Patient returns to my office for continued preventative foot care services. Complaint: Patient states" my nails have grown long and thick and become painful to walk and wear shoes" Patient has been diagnosed polyneuropathy  with diabetes with neuropathy..Patient is taking gabapentin. The patient presents for preventative foot care services. No changes to ROS  Podiatric Exam: Vascular: dorsalis pedis and posterior tibial pulses are palpable bilateral. Capillary return is immediate. Temperature gradient is WNL. Skin turgor WNL  Sensorium: Normal Semmes Weinstein monofilament test. Normal tactile sensation bilaterally. Nail Exam: Pt has thick disfigured discolored nails with subungual debris noted bilateral entire nail hallux through fifth toenails Ulcer Exam: There is no evidence of ulcer or pre-ulcerative changes or infection. Orthopedic Exam: Muscle tone and strength are WNL. No limitations in general ROM. No crepitus or effusions noted. Foot type and digits show no abnormalities. Dorsal lipping 1st metatarsal  B/L.  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 Skin: No Porokeratosis. No infection or ulcers  Diagnosis:  Onychomycosis, , Pain in right toe, pain in left toes  Treatment & Plan Procedures and Treatment: Consent by patient was obtained for treatment procedures.   Debridement of mycotic and hypertrophic toenails, 1 through 5 bilateral and clearing of subungual debris. No ulceration, no infection noted.  Return Visit-Office Procedure: Patient instructed to return to the office for a follow up visit 3 months for continued evaluation and treatment.    Gardiner Barefoot DPM

## 2018-08-29 ENCOUNTER — Encounter: Payer: Self-pay | Admitting: *Deleted

## 2018-09-03 ENCOUNTER — Other Ambulatory Visit: Payer: Self-pay | Admitting: Family Medicine

## 2018-09-03 DIAGNOSIS — I10 Essential (primary) hypertension: Secondary | ICD-10-CM

## 2018-09-03 DIAGNOSIS — L0390 Cellulitis, unspecified: Secondary | ICD-10-CM | POA: Diagnosis not present

## 2018-09-04 ENCOUNTER — Other Ambulatory Visit: Payer: Self-pay | Admitting: Family Medicine

## 2018-09-04 ENCOUNTER — Ambulatory Visit: Payer: PPO | Admitting: Orthotics

## 2018-09-04 DIAGNOSIS — E1149 Type 2 diabetes mellitus with other diabetic neurological complication: Secondary | ICD-10-CM

## 2018-09-04 DIAGNOSIS — S99922A Unspecified injury of left foot, initial encounter: Secondary | ICD-10-CM

## 2018-09-04 DIAGNOSIS — G629 Polyneuropathy, unspecified: Secondary | ICD-10-CM

## 2018-09-04 DIAGNOSIS — E118 Type 2 diabetes mellitus with unspecified complications: Secondary | ICD-10-CM

## 2018-09-04 DIAGNOSIS — M205X9 Other deformities of toe(s) (acquired), unspecified foot: Secondary | ICD-10-CM

## 2018-09-04 DIAGNOSIS — M2020 Hallux rigidus, unspecified foot: Secondary | ICD-10-CM

## 2018-09-04 NOTE — Progress Notes (Signed)

## 2018-09-05 ENCOUNTER — Other Ambulatory Visit: Payer: Self-pay

## 2018-09-05 DIAGNOSIS — I10 Essential (primary) hypertension: Secondary | ICD-10-CM

## 2018-09-05 MED ORDER — LOSARTAN POTASSIUM 25 MG PO TABS: 25.0000 mg | ORAL_TABLET | Freq: Every day | ORAL | 3 refills | Status: DC

## 2018-09-10 DIAGNOSIS — L0390 Cellulitis, unspecified: Secondary | ICD-10-CM | POA: Diagnosis not present

## 2018-09-10 DIAGNOSIS — D239 Other benign neoplasm of skin, unspecified: Secondary | ICD-10-CM | POA: Diagnosis not present

## 2018-09-12 ENCOUNTER — Ambulatory Visit: Payer: PPO

## 2018-09-12 ENCOUNTER — Ambulatory Visit (INDEPENDENT_AMBULATORY_CARE_PROVIDER_SITE_OTHER): Payer: PPO | Admitting: Family Medicine

## 2018-09-12 ENCOUNTER — Other Ambulatory Visit: Payer: Self-pay

## 2018-09-12 VITALS — BP 160/80 | HR 58 | Temp 97.7°F | Resp 18 | Ht 62.0 in | Wt 202.0 lb

## 2018-09-12 DIAGNOSIS — E66812 Obesity, class 2: Secondary | ICD-10-CM

## 2018-09-12 DIAGNOSIS — E119 Type 2 diabetes mellitus without complications: Secondary | ICD-10-CM

## 2018-09-12 DIAGNOSIS — Z0000 Encounter for general adult medical examination without abnormal findings: Secondary | ICD-10-CM

## 2018-09-12 DIAGNOSIS — G4733 Obstructive sleep apnea (adult) (pediatric): Secondary | ICD-10-CM

## 2018-09-12 DIAGNOSIS — Z6836 Body mass index (BMI) 36.0-36.9, adult: Secondary | ICD-10-CM | POA: Diagnosis not present

## 2018-09-12 DIAGNOSIS — Z1211 Encounter for screening for malignant neoplasm of colon: Secondary | ICD-10-CM | POA: Diagnosis not present

## 2018-09-12 DIAGNOSIS — E7800 Pure hypercholesterolemia, unspecified: Secondary | ICD-10-CM | POA: Diagnosis not present

## 2018-09-12 LAB — POCT GLYCOSYLATED HEMOGLOBIN (HGB A1C): HEMOGLOBIN A1C: 7.8 % — AB (ref 4.0–5.6)

## 2018-09-12 NOTE — Progress Notes (Signed)
Patient: Teresa [name], Female    DOB: 1946-02-09, 73 y.o.   MRN: [id number] Visit Date: 09/12/2018  Today's Provider: Wilhemena Durie, MD   Chief Complaint  Patient presents with  . Annual Exam   Subjective:  Teresa [name] is a 73 y.o. female who presents today for health maintenance and complete physical. She feels fairly well. She reports exercising none. She reports she is sleeping poorly.  Immunization History  Administered Date(s) Administered  . Influenza, High Dose Seasonal PF 05/29/2016, 06/06/2017, 05/20/2018  . Pneumococcal Conjugate-13 05/21/2014  . Pneumococcal Polysaccharide-23 11/29/2004, 07/11/2010, 12/08/2015  . Td 07/05/1994  . Tdap 05/11/2011  . Zoster 05/07/2008   10/23/2007 Colonoscopy, [facility]-Diverticulosis, repeat 10 years. 11/26/15 Mammogram-required additional views and then breast biopsy.  Biopsy benign, per Dr Bary Castilla patient was to f/u with him in 1 yr. 02/24/2008 BMD-no report in chart  It is of note that the patient is on antibiotics for cellulitis that developed after mole removal on her left thigh.  Patient had labs done in October and all were stable.   She also needs a face-to-face for her CPAP.  Needs an auto titrating machine.  Study was 2008.  Hopefully this will suffice.  Also time for follow-up colonoscopy.  Patient would like to have sleep study done.  She is currently using a CPAP machine but states the company has not sent her any new supplies in years.  She feels she benefits from the machine but the one she has is several years old.  After reviewing her chart it is noted that her last sleep study was in 2008 and that is when she began using the CPAP machine.   Review of Systems  Constitutional: Negative.   HENT: Negative.   Eyes: Negative.   Respiratory: Negative.   Cardiovascular: Negative.   Gastrointestinal: Negative.   Endocrine: Negative.   Genitourinary: Negative.   Musculoskeletal: Negative.   Skin: Negative.    Allergic/Immunologic: Negative.   Neurological: Negative.   Hematological: Negative.   Psychiatric/Behavioral: Negative.     Social History   Socioeconomic History  . Marital status: Divorced    Spouse name: Not on file  . Number of children: Not on file  . Years of education: Not on file  . Highest education level: Not on file  Occupational History  . Not on file  Social Needs  . Financial resource strain: Not on file  . Food insecurity:    Worry: Not on file    Inability: Not on file  . Transportation needs:    Medical: Not on file    Non-medical: Not on file  Tobacco Use  . Smoking status: Never Smoker  . Smokeless tobacco: Never Used  Substance and Sexual Activity  . Alcohol use: No    Alcohol/week: 0.0 standard drinks  . Drug use: No  . Sexual activity: Not on file  Lifestyle  . Physical activity:    Days per week: Not on file    Minutes per session: Not on file  . Stress: Not on file  Relationships  . Social connections:    Talks on phone: Not on file    Gets together: Not on file    Attends religious service: Not on file    Active member of club or organization: Not on file    Attends meetings of clubs or organizations: Not on file    Relationship status: Not on file  . Intimate partner violence:    Fear of current  or ex partner: Not on file    Emotionally abused: Not on file    Physically abused: Not on file    Forced sexual activity: Not on file  Other Topics Concern  . Not on file  Social History Narrative  . Not on file    Patient Active Problem List   Diagnosis Date Noted  . Polyneuropathy associated with underlying disease ([place]) 12/11/2016  . Microcalcifications of the breast 12/15/2015  . Back pain, chronic 06/28/2015  . Chronic airway obstruction ([place]) 06/28/2015  . Panlobular emphysema ([place]) 09/10/2014  . HYPERLIPIDEMIA 06/21/2009  . Essential hypertension 06/21/2009  . 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 FEVER UNSPECIFIED  06/21/2009  . NAUSEA 06/21/2009  . DYSPHAGIA 06/21/2009  . RUQ PAIN 06/21/2009  . HIATAL HERNIA 01/07/2008  . Hypothyroidism 11/22/2007  . ANXIETY 11/22/2007  . DEPRESSION 11/22/2007  . Hypertensive heart disease without heart failure 11/22/2007  . 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 RENAL CALCULUS 11/22/2007  . Osteoarthritis 11/22/2007  . SLEEP APNEA 11/22/2007  . HEADACHE, CHRONIC 11/22/2007  . GASTRITIS 10/23/2007  . DIVERTICULOSIS, COLON 10/23/2007  . Diabetes ([place]) 07/16/2007  . HYPERCHOLESTEROLEMIA 07/16/2007  . OBESITY, MODERATE 07/16/2007  . ISCHEMIC HEART DISEASE 07/16/2007  . ESOPHAGEAL REFLUX 07/16/2007  . SCLERODERMA 07/16/2007  . DYSPNEA 07/16/2007  . COUGH, CHRONIC 07/16/2007    Past Surgical History:  Procedure Laterality Date  . ABDOMINAL HYSTERECTOMY     ovaries intact  . BLADDER SURGERY     bladder tuck  . BREAST BIOPSY Right 03/20/2016   path pending x 2 area  . CARDIAC CATHETERIZATION    . CARPAL TUNNEL RELEASE    . DILATION AND CURETTAGE OF UTERUS    . EYE SURGERY     eyelid  . FRACTURE SURGERY     left ankle-plate and screws palced  . LITHOTRIPSY    . NISSEN FUNDOPLICATION    . SHOULDER SURGERY Right   . TONSILLECTOMY      Her family history includes Arthritis in her mother; Asthma in her sister; Breast cancer in her maternal aunt and maternal grandmother; Colon cancer in her maternal grandmother; Diabetes in her sister; Diverticulitis in her sister; Heart disease in her father and mother; Hypertension in her father, mother, sister, and sister; Stroke in her mother.     Outpatient Encounter Medications as of 09/12/2018  Medication Sig Note  . allopurinol (ZYLOPRIM) 100 MG tablet TAKE 1 TABLET BY MOUTH EVERY DAY (Patient taking differently: Take 100 mg by mouth at bedtime. )   . atorvastatin (LIPITOR) 10 MG tablet TAKE 1 TABLET BY MOUTH EVERY DAY AT BEDTIME (Patient taking differently: Take 10 mg by mouth at bedtime. )   . colchicine 0.6 MG tablet  Take 0.6 mg by mouth daily as needed (for gout flare-ups).    . Continuous Blood Gluc Sensor (FREESTYLE LIBRE 14 DAY SENSOR) MISC 1 application by Does not apply route 2 (two) times daily as needed.   . doxycycline (VIBRA-TABS) 100 MG tablet Take 100 mg by mouth 2 (two) times daily. 09/05/2018: 7 day therapy course patient began on 09/03/2018  . furosemide (LASIX) 40 MG tablet TAKE 1 TABLET BY MOUTH EVERY DAY (Patient taking differently: Take 40 mg by mouth daily. )   . gabapentin (NEURONTIN) 400 MG capsule Take 1 capsule (400 mg total) by mouth 3 (three) times daily.   Marland Kitchen glimepiride (AMARYL) 4 MG tablet TAKE 1 TABLET (4 MG TOTAL) BY MOUTH 2 (TWO) TIMES DAILY.   Marland Kitchen glucose  blood test strip Check blood sugar daily. Use as instructed   . JARDIANCE 25 MG TABS tablet TAKE 25 MG BY MOUTH DAILY.   Marland Kitchen levothyroxine (SYNTHROID, LEVOTHROID) 88 MCG tablet TAKE 1 TABLET BY MOUTH EVERY DAY (Patient taking differently: Take 88 mcg by mouth daily before breakfast. )   . losartan (COZAAR) 25 MG tablet Take 1 tablet (25 mg total) by mouth daily.   . metFORMIN (GLUCOPHAGE) 1000 MG tablet TAKE 1 TABLET BY MOUTH TWICE A DAY (Patient taking differently: Take 1,000 mg by mouth 2 (two) times daily. )   . nystatin cream (MYCOSTATIN) Apply 1 application topically 2 (two) times daily. (Patient taking differently: Apply 1 application topically 2 (two) times daily as needed (for diabetic yeast reactions.). )   . omeprazole (PRILOSEC) 20 MG capsule Take 20 mg by mouth daily as needed (for acid reflux/indigestion.).  06/28/2015: Received from: [employer] [facility]  . ondansetron (ZOFRAN-ODT) 4 MG disintegrating tablet Take 4 mg by mouth every 8 (eight) hours as needed for nausea or vomiting.   . ONE TOUCH LANCETS MISC 1 each by Does not apply route daily.   . pioglitazone (ACTOS) 30 MG tablet TAKE 1 TABLET BY MOUTH EVERY DAY (Patient taking differently: Take 30 mg by mouth daily. )   . Semaglutide (OZEMPIC) 0.25 or 0.5  MG/DOSE SOPN Inject 0.5 Syringes into the skin once a week. (Patient taking differently: Inject 0.5 mg into the skin every Thursday. )   . [DISCONTINUED] losartan (COZAAR) 50 MG tablet Take 50 mg by mouth daily.    No facility-administered encounter medications on file as of 09/12/2018.     Patient Care Team: [name], MD as PCP - General (Family Medicine) Dingeldein, Remo Lipps, MD as Consulting Physician (Ophthalmology) Gardiner Barefoot, DPM as Consulting Physician (Podiatry)      Objective:   Vitals:  Vitals:   09/12/18 0916  BP: (!) 160/80  Pulse: (!) 58  Resp: 18  Temp: 97.7 F (36.5 C)  TempSrc: Oral  Weight: 202 lb (91.6 kg)  Height: 5\' 2"  (1.575 m)    Physical Exam Constitutional:      Appearance: Normal appearance. She is obese.  HENT:     Head: Normocephalic and atraumatic.     Right Ear: Tympanic membrane, ear canal and external ear normal.     Left Ear: Tympanic membrane, ear canal and external ear normal.     Nose: Nose normal.     Mouth/Throat:     Mouth: Mucous membranes are moist.     Pharynx: Oropharynx is clear.  Eyes:     Extraocular Movements: Extraocular movements intact.     Conjunctiva/sclera: Conjunctivae normal.     Pupils: Pupils are equal, round, and reactive to light.  Neck:     Musculoskeletal: Normal range of motion and neck supple.  Cardiovascular:     Rate and Rhythm: Normal rate and regular rhythm.     Pulses: Normal pulses.     Heart sounds: Normal heart sounds.  Pulmonary:     Effort: Pulmonary effort is normal.     Breath sounds: Normal breath sounds.  Chest:     Breasts:        Right: Normal.        Left: Normal.  Abdominal:     General: Abdomen is flat. Bowel sounds are normal.     Palpations: Abdomen is soft.  Musculoskeletal: Normal range of motion.  Skin:    General: Skin is warm and dry.  Neurological:     General: No focal deficit present.     Mental Status: She is alert and oriented to person, place, and  time. Mental status is at baseline.  Psychiatric:        Mood and Affect: Mood normal.        Behavior: Behavior normal.        Thought Content: Thought content normal.        Judgment: Judgment normal.    Fall Risk  09/12/2018 05/20/2018 01/19/2017 12/08/2015 07/26/2015  Falls in the past year? 1 Yes Yes Yes Yes  Number falls in past yr: 1 2 or more 2 or more 2 or more 2 or more  Comment - - - 6-10 -  Injury with Fall? 0 No No No Yes  Risk Factor Category  - - - - (No Data)  Comment - - - - Patient states when she has fallen she is not doing anything, she does not trip, she does not pass out.  She states she just falls.  Risk for fall due to : History of fall(s);Impaired balance/gait - - Impaired balance/gait History of fall(s);Impaired balance/gait  Follow up Falls evaluation completed;Education provided - (No Data) - -  Comment Patient has been being followed by neurology - currently being evaluated by Dr Melrose Nakayama - -   Depression Screen PHQ 2/9 Scores 01/19/2017 01/19/2017 12/08/2015  PHQ - 2 Score 0 0 2  PHQ- 9 Score 4 4 5    Current Exercise Habits: The patient does not participate in regular exercise at present    Functional Status Survey: Is the patient deaf or have difficulty hearing?: No Does the patient have difficulty seeing, even when wearing glasses/contacts?: Yes(patient with cataracts and has surgery scheduled) Does the patient have difficulty concentrating, remembering, or making decisions?: No Does the patient have difficulty walking or climbing stairs?: Yes Does the patient have difficulty dressing or bathing?: No Does the patient have difficulty doing errands alone such as visiting a doctor's office or shopping?: No    Office Visit from 09/12/2018 in [facility]  AUDIT-C Score  0      Assessment & Plan:     Routine Health Maintenance and Physical Exam  Exercise Activities and Dietary recommendations Goals    . Eat more fruits and vegetables      Recommend increasing fruits and vegetables in daily diet to at least 2 sevings a day.        Immunization History  Administered Date(s) Administered  . Influenza, High Dose Seasonal PF 05/29/2016, 06/06/2017, 05/20/2018  . Pneumococcal Conjugate-13 05/21/2014  . Pneumococcal Polysaccharide-23 11/29/2004, 07/11/2010, 12/08/2015  . Td 07/05/1994  . Tdap 05/11/2011  . Zoster 05/07/2008    Health Maintenance  Topic Date Due  . COLONOSCOPY  10/22/2017  . MAMMOGRAM  12/09/2017  . DEXA SCAN  02/23/2018  . HEMOGLOBIN A1C  11/20/2018  . FOOT EXAM  05/21/2019  . OPHTHALMOLOGY EXAM  07/25/2019  . TETANUS/TDAP  05/10/2021  . INFLUENZA VACCINE  Completed  . Hepatitis C Screening  Completed  . PNA vac Low Risk Adult  Completed     Discussed health benefits of physical activity, and encouraged her to engage in regular exercise appropriate for her age and condition.  1. Annual physical exam   2. Colon cancer screening  - Ambulatory referral to Gastroenterology  3. OSA (obstructive sleep apnea) Sleep study done 2008.  New sleep study and then try for auto titrating machine. - Ambulatory referral to Sleep  Studies  4. Type 2 diabetes mellitus without complication, unspecified whether long term insulin use (HCC) A1C7.8 today. - POCT glycosylated hemoglobin (Hb A1C)  5. HYPERCHOLESTEROLEMIA Pt says she is statin intolerant.  6. Class 2 severe obesity due to excess calories with serious comorbidity and body mass index (BMI) of 36.0 to 36.9 in adult [facility]) With DM,OSA. Lifestyle discussed.  I have done the exam and reviewed the chart and it is accurate to the best of my knowledge. Development worker, community has been used and  any errors in dictation or transcription are unintentional. Miguel Aschoff M.D. [facility]

## 2018-09-13 ENCOUNTER — Other Ambulatory Visit: Payer: Self-pay

## 2018-09-13 DIAGNOSIS — Z1211 Encounter for screening for malignant neoplasm of colon: Secondary | ICD-10-CM

## 2018-09-17 ENCOUNTER — Encounter: Payer: Self-pay | Admitting: Ophthalmology

## 2018-09-17 ENCOUNTER — Encounter
Admission: RE | Disposition: A | Discharge status: Home / Self Care | Payer: Self-pay | Source: Home / Self Care | Attending: Ophthalmology

## 2018-09-17 ENCOUNTER — Telehealth: Payer: Self-pay | Admitting: Family Medicine

## 2018-09-17 ENCOUNTER — Ambulatory Visit: Payer: PPO | Admitting: Anesthesiology

## 2018-09-17 ENCOUNTER — Other Ambulatory Visit: Payer: Self-pay

## 2018-09-17 ENCOUNTER — Ambulatory Visit
Admission: RE | Admit: 2018-09-17 | Discharge: 2018-09-17 | Disposition: A | Discharge status: Home / Self Care | Payer: PPO | Attending: Ophthalmology | Admitting: Ophthalmology

## 2018-09-17 DIAGNOSIS — F418 Other specified anxiety disorders: Secondary | ICD-10-CM | POA: Diagnosis not present

## 2018-09-17 DIAGNOSIS — J449 Chronic obstructive pulmonary disease, unspecified: Secondary | ICD-10-CM | POA: Insufficient documentation

## 2018-09-17 DIAGNOSIS — Z79890 Hormone replacement therapy (postmenopausal): Secondary | ICD-10-CM | POA: Insufficient documentation

## 2018-09-17 DIAGNOSIS — Z7984 Long term (current) use of oral hypoglycemic drugs: Secondary | ICD-10-CM | POA: Insufficient documentation

## 2018-09-17 DIAGNOSIS — E039 Hypothyroidism, unspecified: Secondary | ICD-10-CM | POA: Insufficient documentation

## 2018-09-17 DIAGNOSIS — G4730 Sleep apnea, unspecified: Secondary | ICD-10-CM | POA: Insufficient documentation

## 2018-09-17 DIAGNOSIS — E1140 Type 2 diabetes mellitus with diabetic neuropathy, unspecified: Secondary | ICD-10-CM | POA: Diagnosis not present

## 2018-09-17 DIAGNOSIS — K219 Gastro-esophageal reflux disease without esophagitis: Secondary | ICD-10-CM | POA: Insufficient documentation

## 2018-09-17 DIAGNOSIS — M349 Systemic sclerosis, unspecified: Secondary | ICD-10-CM | POA: Insufficient documentation

## 2018-09-17 DIAGNOSIS — E7800 Pure hypercholesterolemia, unspecified: Secondary | ICD-10-CM | POA: Diagnosis not present

## 2018-09-17 DIAGNOSIS — I2510 Atherosclerotic heart disease of native coronary artery without angina pectoris: Secondary | ICD-10-CM | POA: Insufficient documentation

## 2018-09-17 DIAGNOSIS — E119 Type 2 diabetes mellitus without complications: Secondary | ICD-10-CM | POA: Diagnosis not present

## 2018-09-17 DIAGNOSIS — H2512 Age-related nuclear cataract, left eye: Secondary | ICD-10-CM | POA: Diagnosis not present

## 2018-09-17 DIAGNOSIS — E669 Obesity, unspecified: Secondary | ICD-10-CM | POA: Diagnosis not present

## 2018-09-17 DIAGNOSIS — K449 Diaphragmatic hernia without obstruction or gangrene: Secondary | ICD-10-CM | POA: Insufficient documentation

## 2018-09-17 DIAGNOSIS — E1136 Type 2 diabetes mellitus with diabetic cataract: Secondary | ICD-10-CM | POA: Insufficient documentation

## 2018-09-17 DIAGNOSIS — J431 Panlobular emphysema: Secondary | ICD-10-CM | POA: Diagnosis not present

## 2018-09-17 DIAGNOSIS — Z79899 Other long term (current) drug therapy: Secondary | ICD-10-CM | POA: Insufficient documentation

## 2018-09-17 DIAGNOSIS — I10 Essential (primary) hypertension: Secondary | ICD-10-CM | POA: Diagnosis not present

## 2018-09-17 DIAGNOSIS — I25119 Atherosclerotic heart disease of native coronary artery with unspecified angina pectoris: Secondary | ICD-10-CM | POA: Diagnosis not present

## 2018-09-17 DIAGNOSIS — Z6836 Body mass index (BMI) 36.0-36.9, adult: Secondary | ICD-10-CM | POA: Diagnosis not present

## 2018-09-17 DIAGNOSIS — I119 Hypertensive heart disease without heart failure: Secondary | ICD-10-CM | POA: Diagnosis not present

## 2018-09-17 HISTORY — DX: Adverse effect of unspecified anesthetic, initial encounter: T41.45XA

## 2018-09-17 HISTORY — DX: Depression, unspecified: F32.A

## 2018-09-17 HISTORY — PX: CATARACT EXTRACTION W/PHACO: SHX586

## 2018-09-17 HISTORY — DX: Headache: R51

## 2018-09-17 HISTORY — DX: Personal history of other diseases of the digestive system: Z87.19

## 2018-09-17 HISTORY — DX: Sleep apnea, unspecified: G47.30

## 2018-09-17 HISTORY — DX: Edema, unspecified: R60.9

## 2018-09-17 HISTORY — DX: Other specified postprocedural states: Z98.890

## 2018-09-17 HISTORY — DX: Other specified postprocedural states: R11.2

## 2018-09-17 HISTORY — DX: Personal history of other specified conditions: Z87.898

## 2018-09-17 HISTORY — DX: Cardiac murmur, unspecified: R01.1

## 2018-09-17 HISTORY — DX: Major depressive disorder, single episode, unspecified: F32.9

## 2018-09-17 HISTORY — DX: Dizziness and giddiness: R42

## 2018-09-17 HISTORY — DX: Dyspnea, unspecified: R06.00

## 2018-09-17 HISTORY — DX: Headache, unspecified: R51.9

## 2018-09-17 HISTORY — DX: Hypothyroidism, unspecified: E03.9

## 2018-09-17 HISTORY — DX: Other complications of anesthesia, initial encounter: T88.59XA

## 2018-09-17 LAB — GLUCOSE, CAPILLARY: Glucose-Capillary: 143 mg/dL — ABNORMAL HIGH (ref 70–99)

## 2018-09-17 SURGERY — PHACOEMULSIFICATION, CATARACT, WITH IOL INSERTION
Anesthesia: Monitor Anesthesia Care | Site: Eye | Laterality: Left

## 2018-09-17 MED ORDER — TETRACAINE HCL 0.5 % OP SOLN
OPHTHALMIC | Status: AC
  Administered 2018-09-17: 1 [drp] via OPHTHALMIC
  Filled 2018-09-17: qty 4

## 2018-09-17 MED ORDER — ARMC OPHTHALMIC DILATING DROPS
OPHTHALMIC | Status: AC
  Administered 2018-09-17: 1 via OPHTHALMIC
  Filled 2018-09-17: qty 0.5

## 2018-09-17 MED ORDER — FENTANYL CITRATE (PF) 100 MCG/2ML IJ SOLN
INTRAMUSCULAR | Status: AC
  Filled 2018-09-17: qty 2

## 2018-09-17 MED ORDER — MOXIFLOXACIN HCL 0.5 % OP SOLN: 1.0000 [drp] | OPHTHALMIC | Status: DC | PRN

## 2018-09-17 MED ORDER — ACETAMINOPHEN 325 MG PO TABS
ORAL_TABLET | ORAL | Status: AC
  Filled 2018-09-17: qty 2

## 2018-09-17 MED ORDER — MIDAZOLAM HCL 5 MG/5ML IJ SOLN
INTRAMUSCULAR | Status: DC | PRN
  Administered 2018-09-17 (×2): 1 mg via INTRAVENOUS

## 2018-09-17 MED ORDER — TETRACAINE HCL 0.5 % OP SOLN
1.0000 [drp] | OPHTHALMIC | Status: AC | PRN
  Administered 2018-09-17 (×3): 1 [drp] via OPHTHALMIC

## 2018-09-17 MED ORDER — POVIDONE-IODINE 5 % OP SOLN
OPHTHALMIC | Status: DC | PRN
  Administered 2018-09-17: 1 via OPHTHALMIC

## 2018-09-17 MED ORDER — NA CHONDROIT SULF-NA HYALURON 40-17 MG/ML IO SOLN
INTRAOCULAR | Status: AC
  Filled 2018-09-17: qty 1

## 2018-09-17 MED ORDER — SODIUM CHLORIDE 0.9 % IV SOLN
INTRAVENOUS | Status: DC
  Administered 2018-09-17: 07:00:00 via INTRAVENOUS

## 2018-09-17 MED ORDER — FENTANYL CITRATE (PF) 100 MCG/2ML IJ SOLN
INTRAMUSCULAR | Status: DC | PRN
  Administered 2018-09-17 (×2): 50 ug via INTRAVENOUS

## 2018-09-17 MED ORDER — MOXIFLOXACIN HCL 0.5 % OP SOLN
OPHTHALMIC | Status: AC
  Filled 2018-09-17: qty 3

## 2018-09-17 MED ORDER — LIDOCAINE HCL (PF) 4 % IJ SOLN
INTRAOCULAR | Status: DC | PRN
  Administered 2018-09-17: 2 mL via OPHTHALMIC

## 2018-09-17 MED ORDER — CARBACHOL 0.01 % IO SOLN
INTRAOCULAR | Status: DC | PRN
  Administered 2018-09-17: .5 mL via INTRAOCULAR

## 2018-09-17 MED ORDER — TRYPAN BLUE 0.06 % OP SOLN
OPHTHALMIC | Status: AC
  Filled 2018-09-17: qty 0.5

## 2018-09-17 MED ORDER — NA CHONDROIT SULF-NA HYALURON 40-17 MG/ML IO SOLN
INTRAOCULAR | Status: DC | PRN
  Administered 2018-09-17: 1 mL via INTRAOCULAR

## 2018-09-17 MED ORDER — ACETAMINOPHEN 325 MG PO TABS
650.0000 mg | ORAL_TABLET | Freq: Once | ORAL | Status: AC
  Administered 2018-09-17: 650 mg via ORAL

## 2018-09-17 MED ORDER — EPINEPHRINE PF 1 MG/ML IJ SOLN
INTRAMUSCULAR | Status: AC
  Filled 2018-09-17: qty 1

## 2018-09-17 MED ORDER — LIDOCAINE HCL (PF) 4 % IJ SOLN
INTRAMUSCULAR | Status: AC
  Filled 2018-09-17: qty 5

## 2018-09-17 MED ORDER — MOXIFLOXACIN HCL 0.5 % OP SOLN
OPHTHALMIC | Status: DC | PRN
  Administered 2018-09-17: .2 mL via OPHTHALMIC

## 2018-09-17 MED ORDER — EPINEPHRINE PF 1 MG/ML IJ SOLN
INTRAOCULAR | Status: DC | PRN
  Administered 2018-09-17: 1 mL via OPHTHALMIC

## 2018-09-17 MED ORDER — MIDAZOLAM HCL 2 MG/2ML IJ SOLN
INTRAMUSCULAR | Status: AC
  Filled 2018-09-17: qty 2

## 2018-09-17 MED ORDER — ARMC OPHTHALMIC DILATING DROPS
1.0000 "application " | OPHTHALMIC | Status: AC
  Administered 2018-09-17 (×3): 1 via OPHTHALMIC

## 2018-09-17 MED ORDER — POVIDONE-IODINE 5 % OP SOLN
OPHTHALMIC | Status: AC
  Filled 2018-09-17: qty 30

## 2018-09-17 SURGICAL SUPPLY — 18 items
GLOVE BIO SURGEON STRL SZ8 (GLOVE) ×2 IMPLANT
GLOVE BIOGEL M 6.5 STRL (GLOVE) ×2 IMPLANT
GLOVE SURG LX 8.0 MICRO (GLOVE) ×1
GLOVE SURG LX STRL 8.0 MICRO (GLOVE) ×1 IMPLANT
GOWN STRL REUS W/ TWL LRG LVL3 (GOWN DISPOSABLE) ×2 IMPLANT
GOWN STRL REUS W/TWL LRG LVL3 (GOWN DISPOSABLE) ×2
LABEL CATARACT MEDS ST (LABEL) ×2 IMPLANT
LENS IOL ACRSF IQ TRC 3 25.0 IMPLANT
LENS IOL ACRYSOF IQ TORIC 25.0 ×1 IMPLANT
LENS IOL IQ TORIC 3 25.0 ×1 IMPLANT
PACK CATARACT (MISCELLANEOUS) ×2 IMPLANT
PACK CATARACT BRASINGTON LX (MISCELLANEOUS) ×2 IMPLANT
PACK EYE AFTER SURG (MISCELLANEOUS) ×2 IMPLANT
SOL BSS BAG (MISCELLANEOUS) ×2
SOLUTION BSS BAG (MISCELLANEOUS) ×1 IMPLANT
SYR 5ML LL (SYRINGE) ×2 IMPLANT
WATER STERILE IRR 250ML POUR (IV SOLUTION) ×2 IMPLANT
WIPE NON LINTING 3.25X3.25 (MISCELLANEOUS) ×2 IMPLANT

## 2018-09-17 NOTE — Anesthesia Postprocedure Evaluation (Signed)
Anesthesia Post Note  Patient: Teresa [name]  Procedure(s) Performed: CATARACT EXTRACTION PHACO AND INTRAOCULAR LENS PLACEMENT (IOC) LEFT, DIABETIC (Left Eye)  Patient location during evaluation: PACU Anesthesia Type: MAC Level of consciousness: awake and alert Pain management: pain level controlled Vital Signs Assessment: post-procedure vital signs reviewed and stable Respiratory status: spontaneous breathing, nonlabored ventilation, respiratory function stable and patient connected to nasal cannula oxygen Cardiovascular status: stable and blood pressure returned to baseline Postop Assessment: no apparent nausea or vomiting Anesthetic complications: no     Last Vitals:  Vitals:   09/17/18 0626 09/17/18 0752  BP: (!) 171/84 (!) 142/77  Pulse: 66 64  Resp:  16  Temp: (!) 36.3 C 36.8 C  SpO2: 98% 96%    Last Pain:  Vitals:   09/17/18 0752  TempSrc: Temporal  PainSc: 6                  Tashonda Pinkus

## 2018-09-17 NOTE — Anesthesia Post-op Follow-up Note (Signed)
Anesthesia QCDR form completed.        

## 2018-09-17 NOTE — Anesthesia Preprocedure Evaluation (Signed)
Anesthesia Evaluation  Patient identified by MRN, date of birth, ID band Patient awake    Reviewed: Allergy & Precautions, NPO status , Patient's Chart, lab work & pertinent test results  History of Anesthesia Complications (+) PONV and history of anesthetic complications  Airway Mallampati: II  TM Distance: >3 FB Neck ROM: Full    Dental  (+) Poor Dentition   Pulmonary sleep apnea , COPD,  COPD inhaler,    breath sounds clear to auscultation- rhonchi (-) wheezing      Cardiovascular hypertension, + CAD  (-) Past MI, (-) Cardiac Stents and (-) CABG  Rhythm:Regular Rate:Normal - Systolic murmurs and - Diastolic murmurs    Neuro/Psych  Headaches, neg Seizures PSYCHIATRIC DISORDERS Anxiety Depression    GI/Hepatic hiatal hernia, GERD  ,  Endo/Other  diabetes, Oral Hypoglycemic AgentsHypothyroidism   Renal/GU Renal disease: hx of nephrolithiasis.     Musculoskeletal  (+) Arthritis ,   Abdominal (+) + obese,   Peds  Hematology negative hematology ROS (+)   Anesthesia Other Findings Past Medical History: No date: Complication of anesthesia No date: Depression No date: Diabetes mellitus without complication (HCC) No date: Dyspnea     Comment:  DOE No date: Edema     Comment:  FEET/LEGS No date: GERD (gastroesophageal reflux disease) No date: Gout No date: Headache No date: Heart murmur No date: History of hiatal hernia No date: History of orthopnea No date: Hypertension No date: Hypothyroidism No date: PONV (postoperative nausea and vomiting) No date: Scleroderma (HCC)     Comment:  hands No date: Sleep apnea     Comment:  NO CPAP No date: Thyroid disease No date: Vertigo   Reproductive/Obstetrics                             Anesthesia Physical Anesthesia Plan  ASA: III  Anesthesia Plan: MAC   Post-op Pain Management:    Induction: Intravenous  PONV Risk Score and Plan: 3  and Midazolam  Airway Management Planned: Natural Airway  Additional Equipment:   Intra-op Plan:   Post-operative Plan:   Informed Consent: I have reviewed the patients History and Physical, chart, labs and discussed the procedure including the risks, benefits and alternatives for the proposed anesthesia with the patient or authorized representative who has indicated his/her understanding and acceptance.       Plan Discussed with: CRNA and Anesthesiologist  Anesthesia Plan Comments:         Anesthesia Quick Evaluation

## 2018-09-17 NOTE — OR Nursing (Signed)
Pt has allergy to benzalkonium Chloride.  Cross sensitivity to the dialting drops noted in system.  Clarified med admin with Dr George Ina,  Ok to proceed with the Ophthalmic drops.

## 2018-09-17 NOTE — Telephone Encounter (Signed)
What type of sleep study do you want ordered for pt ?

## 2018-09-17 NOTE — Op Note (Signed)
PREOPERATIVE DIAGNOSIS:  Nuclear sclerotic cataract of the left eye.   POSTOPERATIVE DIAGNOSIS:  Nuclear sclerotic cataract of the left eye.   OPERATIVE PROCEDURE: Procedure(s): CATARACT EXTRACTION PHACO AND INTRAOCULAR LENS PLACEMENT (IOC) LEFT, DIABETIC   SURGEON:  Birder Robson, MD.   ANESTHESIA: 1.      Managed anesthesia care. 2.     0.9ml os Shugarcaine was instilled following the paracentesis 2oranesstaff@   COMPLICATIONS:  None.   TECHNIQUE:   Stop and chop    DESCRIPTION OF PROCEDURE:  The patient was examined and consented in the preoperative holding area where the aforementioned topical anesthesia was applied to the left eye.  The patient was brought back to the Operating Room where he was sat upright on the gurney and given a target to fixate upon while the eye was marked at the 3:00 and 9:00 position.  The patient was then reclined on the operating table.  The eye was prepped and draped in the usual sterile ophthalmic fashion and a lid speculum was placed. A paracentesis was created with the side port blade and the anterior chamber was filled with viscoelastic. A near clear corneal incision was performed with the steel keratome. A continuous curvilinear capsulorrhexis was performed with a cystotome followed by the capsulorrhexis forceps. Hydrodissection and hydrodelineation were carried out with BSS on a blunt cannula. The lens was removed in a stop and chop technique and the remaining cortical material was removed with the irrigation-aspiration handpiece. The eye was inflated with viscoelastic and the ZCT lens was placed in the eye and rotated to within a few degrees of the predetermined orientation.  The remaining viscoelastic was removed from the eye.  The Sinskey hook was used to rotate the toric lens into its final resting place at 176degrees.  0.1 ml of Vigamox was placed in the anterior chamber. The eye was inflated to a physiologic pressure and found to be watertight.  The  eye was dressed with Vigamox. The patient was given protective glasses to wear throughout the day and a shield with which to sleep tonight. The patient was also given drops with which to begin a drop regimen today and will follow-up with me in one day. Implant Name Type Inv. Item Serial No. Manufacturer Lot No. LRB No. Used  LENS IOL TORIC 25.0 - [id number]  LENS IOL TORIC 25.0 [id number] [employer]CON  Left 1   Procedure(s) with comments: CATARACT EXTRACTION PHACO AND INTRAOCULAR LENS PLACEMENT (IOC) LEFT, DIABETIC (Left) - [place] 00:34 CDE 4.85 Fluid pack lot # [id number]  Electronically signed: Birder Robson 2/11/20207:51 AM

## 2018-09-17 NOTE — H&P (Signed)
All labs reviewed. Abnormal studies sent to patients PCP when indicated.  Previous H&P reviewed, patient examined, there are NO CHANGES.  Teresa [name] Porfilio2/11/20207:13 AM

## 2018-09-17 NOTE — Telephone Encounter (Signed)
Split night

## 2018-09-17 NOTE — Discharge Instructions (Addendum)
Eye Surgery Discharge Instructions  Expect mild scratchy sensation or mild soreness. DO NOT RUB YOUR EYE!  The day of surgery:  Minimal physical activity, but bed rest is not required  No reading, computer work, or close hand work  No bending, lifting, or straining.  May watch TV  For 24 hours:  No driving, legal decisions, or alcoholic beverages  Safety precautions  Eat anything you prefer: It is better to start with liquids, then soup then solid foods.  Solar shield eyeglasses should be worn for comfort in the sunlight/patch while sleeping  Resume all regular medications including aspirin or Coumadin if these were discontinued prior to surgery. You may shower, bathe, shave, or wash your hair. Tylenol may be taken for mild discomfort. Follow Dr. Inda Coke discharge instruction sheet as reviewed.  Call your doctor if you experience significant pain, nausea, or vomiting, fever > 101 or other signs of infection. [phone] or [phone] Specific instructions:  Follow-up Information    Birder Robson, MD Follow up.   Specialty:  Ophthalmology Why:  09/18/18 @ 8:10 am Contact information: [address] [place] [address] [phone]

## 2018-09-17 NOTE — Transfer of Care (Signed)
Immediate Anesthesia Transfer of Care Note  Patient: Teresa [name]  Procedure(s) Performed: CATARACT EXTRACTION PHACO AND INTRAOCULAR LENS PLACEMENT (IOC) LEFT, DIABETIC (Left Eye)  Patient Location: PACU and Short Stay  Anesthesia Type:MAC  Level of Consciousness: awake, alert  and oriented  Airway & Oxygen Therapy: Patient Spontanous Breathing  Post-op Assessment: Report given to RN and Post -op Vital signs reviewed and stable  Post vital signs: Reviewed and stable  Last Vitals:  Vitals Value Taken Time  BP    Temp    Pulse    Resp    SpO2      Last Pain: There were no vitals filed for this visit.       Complications: No apparent anesthesia complications

## 2018-09-27 ENCOUNTER — Encounter
Admission: RE | Disposition: A | Discharge status: Home / Self Care | Payer: Self-pay | Source: Home / Self Care | Attending: Gastroenterology

## 2018-09-27 ENCOUNTER — Ambulatory Visit: Payer: PPO

## 2018-09-27 ENCOUNTER — Ambulatory Visit: Payer: PPO | Admitting: Anesthesiology

## 2018-09-27 ENCOUNTER — Ambulatory Visit
Admission: RE | Admit: 2018-09-27 | Discharge: 2018-09-27 | Disposition: A | Discharge status: Home / Self Care | Payer: PPO | Attending: Gastroenterology | Admitting: Gastroenterology

## 2018-09-27 DIAGNOSIS — R42 Dizziness and giddiness: Secondary | ICD-10-CM | POA: Insufficient documentation

## 2018-09-27 DIAGNOSIS — Z823 Family history of stroke: Secondary | ICD-10-CM | POA: Insufficient documentation

## 2018-09-27 DIAGNOSIS — Z79899 Other long term (current) drug therapy: Secondary | ICD-10-CM | POA: Diagnosis not present

## 2018-09-27 DIAGNOSIS — I10 Essential (primary) hypertension: Secondary | ICD-10-CM | POA: Insufficient documentation

## 2018-09-27 DIAGNOSIS — D120 Benign neoplasm of cecum: Secondary | ICD-10-CM

## 2018-09-27 DIAGNOSIS — Z8261 Family history of arthritis: Secondary | ICD-10-CM | POA: Insufficient documentation

## 2018-09-27 DIAGNOSIS — Z887 Allergy status to serum and vaccine status: Secondary | ICD-10-CM | POA: Insufficient documentation

## 2018-09-27 DIAGNOSIS — Z961 Presence of intraocular lens: Secondary | ICD-10-CM | POA: Insufficient documentation

## 2018-09-27 DIAGNOSIS — Z881 Allergy status to other antibiotic agents status: Secondary | ICD-10-CM | POA: Diagnosis not present

## 2018-09-27 DIAGNOSIS — R011 Cardiac murmur, unspecified: Secondary | ICD-10-CM | POA: Diagnosis not present

## 2018-09-27 DIAGNOSIS — K449 Diaphragmatic hernia without obstruction or gangrene: Secondary | ICD-10-CM | POA: Diagnosis not present

## 2018-09-27 DIAGNOSIS — Z9842 Cataract extraction status, left eye: Secondary | ICD-10-CM | POA: Diagnosis not present

## 2018-09-27 DIAGNOSIS — Z1211 Encounter for screening for malignant neoplasm of colon: Secondary | ICD-10-CM | POA: Diagnosis not present

## 2018-09-27 DIAGNOSIS — Z7984 Long term (current) use of oral hypoglycemic drugs: Secondary | ICD-10-CM | POA: Insufficient documentation

## 2018-09-27 DIAGNOSIS — K5730 Diverticulosis of large intestine without perforation or abscess without bleeding: Secondary | ICD-10-CM

## 2018-09-27 DIAGNOSIS — R109 Unspecified abdominal pain: Secondary | ICD-10-CM

## 2018-09-27 DIAGNOSIS — Z91048 Other nonmedicinal substance allergy status: Secondary | ICD-10-CM | POA: Insufficient documentation

## 2018-09-27 DIAGNOSIS — K635 Polyp of colon: Secondary | ICD-10-CM

## 2018-09-27 DIAGNOSIS — K5790 Diverticulosis of intestine, part unspecified, without perforation or abscess without bleeding: Secondary | ICD-10-CM | POA: Diagnosis not present

## 2018-09-27 DIAGNOSIS — M349 Systemic sclerosis, unspecified: Secondary | ICD-10-CM | POA: Diagnosis not present

## 2018-09-27 DIAGNOSIS — Z8249 Family history of ischemic heart disease and other diseases of the circulatory system: Secondary | ICD-10-CM | POA: Insufficient documentation

## 2018-09-27 DIAGNOSIS — D125 Benign neoplasm of sigmoid colon: Secondary | ICD-10-CM | POA: Diagnosis not present

## 2018-09-27 DIAGNOSIS — R0601 Orthopnea: Secondary | ICD-10-CM | POA: Insufficient documentation

## 2018-09-27 DIAGNOSIS — Z803 Family history of malignant neoplasm of breast: Secondary | ICD-10-CM | POA: Insufficient documentation

## 2018-09-27 DIAGNOSIS — F329 Major depressive disorder, single episode, unspecified: Secondary | ICD-10-CM | POA: Diagnosis not present

## 2018-09-27 DIAGNOSIS — M109 Gout, unspecified: Secondary | ICD-10-CM | POA: Insufficient documentation

## 2018-09-27 DIAGNOSIS — K6389 Other specified diseases of intestine: Secondary | ICD-10-CM | POA: Diagnosis not present

## 2018-09-27 DIAGNOSIS — E039 Hypothyroidism, unspecified: Secondary | ICD-10-CM | POA: Diagnosis not present

## 2018-09-27 DIAGNOSIS — E119 Type 2 diabetes mellitus without complications: Secondary | ICD-10-CM | POA: Insufficient documentation

## 2018-09-27 DIAGNOSIS — Q438 Other specified congenital malformations of intestine: Secondary | ICD-10-CM

## 2018-09-27 DIAGNOSIS — Z888 Allergy status to other drugs, medicaments and biological substances status: Secondary | ICD-10-CM | POA: Insufficient documentation

## 2018-09-27 DIAGNOSIS — G4730 Sleep apnea, unspecified: Secondary | ICD-10-CM | POA: Diagnosis not present

## 2018-09-27 DIAGNOSIS — K219 Gastro-esophageal reflux disease without esophagitis: Secondary | ICD-10-CM | POA: Diagnosis not present

## 2018-09-27 DIAGNOSIS — Z885 Allergy status to narcotic agent status: Secondary | ICD-10-CM | POA: Insufficient documentation

## 2018-09-27 DIAGNOSIS — Z90710 Acquired absence of both cervix and uterus: Secondary | ICD-10-CM | POA: Diagnosis not present

## 2018-09-27 DIAGNOSIS — Z825 Family history of asthma and other chronic lower respiratory diseases: Secondary | ICD-10-CM | POA: Insufficient documentation

## 2018-09-27 DIAGNOSIS — E1142 Type 2 diabetes mellitus with diabetic polyneuropathy: Secondary | ICD-10-CM | POA: Diagnosis not present

## 2018-09-27 DIAGNOSIS — Z800 Family history of malignant neoplasm of digestive organs: Secondary | ICD-10-CM | POA: Insufficient documentation

## 2018-09-27 DIAGNOSIS — Z833 Family history of diabetes mellitus: Secondary | ICD-10-CM | POA: Insufficient documentation

## 2018-09-27 HISTORY — PX: COLONOSCOPY WITH PROPOFOL: SHX5780

## 2018-09-27 LAB — GLUCOSE, CAPILLARY: Glucose-Capillary: 151 mg/dL — ABNORMAL HIGH (ref 70–99)

## 2018-09-27 SURGERY — COLONOSCOPY WITH PROPOFOL
Anesthesia: General

## 2018-09-27 MED ORDER — PROPOFOL 500 MG/50ML IV EMUL
INTRAVENOUS | Status: DC | PRN
  Administered 2018-09-27: 100 ug/kg/min via INTRAVENOUS

## 2018-09-27 MED ORDER — PROPOFOL 10 MG/ML IV BOLUS
INTRAVENOUS | Status: DC | PRN
  Administered 2018-09-27 (×6): 20 mg via INTRAVENOUS
  Administered 2018-09-27: 10 mg via INTRAVENOUS
  Administered 2018-09-27: 20 mg via INTRAVENOUS
  Administered 2018-09-27: 10 mg via INTRAVENOUS
  Administered 2018-09-27: 20 mg via INTRAVENOUS

## 2018-09-27 MED ORDER — SODIUM CHLORIDE 0.9 % IV SOLN
INTRAVENOUS | Status: DC
  Administered 2018-09-27: 1000 mL via INTRAVENOUS

## 2018-09-27 NOTE — Transfer of Care (Signed)
Immediate Anesthesia Transfer of Care Note  Patient: Teresa [name]  Procedure(s) Performed: COLONOSCOPY WITH PROPOFOL (N/A )  Patient Location: PACU and Endoscopy Unit  Anesthesia Type:MAC  Level of Consciousness: awake  Airway & Oxygen Therapy: Patient Spontanous Breathing  Post-op Assessment: Report given to RN  Post vital signs: Reviewed  Last Vitals:  Vitals Value Taken Time  BP 133/65 09/27/2018  1:44 PM  Temp    Pulse 81 09/27/2018  1:44 PM  Resp 14 09/27/2018  1:44 PM  SpO2 98 % 09/27/2018  1:44 PM  Vitals shown include unvalidated device data.  Last Pain:  Vitals:   09/27/18 1036  TempSrc: Tympanic  PainSc: 0-No pain         Complications: No apparent anesthesia complications

## 2018-09-27 NOTE — H&P (Signed)
Vonda Antigua, MD [address] [place], [place], [place], [address] [address], [place], [place], [place], [address] Phone: [phone]  Fax: [phone]  Primary Care Physician:  [name], MD   Pre-Procedure History & Physical: HPI:  Teresa [name] is a 73 y.o. female is here for a colonoscopy.   Past Medical History:  Diagnosis Date  . Complication of anesthesia   . Depression   . Diabetes mellitus without complication ([place])   . Dyspnea    DOE  . Edema    FEET/LEGS  . GERD (gastroesophageal reflux disease)   . Gout   . Headache   . Heart murmur   . History of hiatal hernia   . History of orthopnea   . Hypertension   . Hypothyroidism   . PONV (postoperative nausea and vomiting)   . 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 Sleep apnea    NO CPAP  . Thyroid disease   . Vertigo     Past Surgical History:  Procedure Laterality Date  . ABDOMINAL HYSTERECTOMY     ovaries intact  . BLADDER SURGERY     bladder tuck  . BREAST BIOPSY Right 03/20/2016   path pending x 2 area  . CARDIAC CATHETERIZATION    . CARPAL TUNNEL RELEASE    . CATARACT EXTRACTION W/PHACO Left 09/17/2018   Procedure: CATARACT EXTRACTION PHACO AND INTRAOCULAR LENS PLACEMENT (IOC) LEFT, DIABETIC;  Surgeon: Birder Robson, MD;  Location: [facility] ORS;  Service: Ophthalmology;  Laterality: Left;  [place] 00:34 CDE 4.85 Fluid pack lot # [id number]  . DILATION AND CURETTAGE OF UTERUS    . EYE SURGERY     eyelid  . FRACTURE SURGERY     left ankle-plate and screws palced  . LITHOTRIPSY    . NISSEN FUNDOPLICATION    . SHOULDER SURGERY Right   . TONSILLECTOMY      Prior to Admission medications   Medication Sig Start Date End Date Taking? Authorizing Provider  allopurinol (ZYLOPRIM) 100 MG tablet TAKE 1 TABLET BY MOUTH EVERY DAY Patient taking differently: Take 100 mg by mouth at bedtime.  04/20/18  Yes [name], MD  atorvastatin (LIPITOR) 10 MG tablet TAKE 1 TABLET BY MOUTH  EVERY DAY AT BEDTIME Patient taking differently: Take 10 mg by mouth at bedtime.  07/05/18  Yes [name], MD  colchicine 0.6 MG tablet Take 0.6 mg by mouth daily as needed (for gout flare-ups).    Yes [provider]  Continuous Blood Gluc Sensor (FREESTYLE LIBRE 14 DAY SENSOR) MISC 1 application by Does not apply route 2 (two) times daily as needed. 11/02/17  Yes [name], MD  furosemide (LASIX) 40 MG tablet TAKE 1 TABLET BY MOUTH EVERY DAY Patient taking differently: Take 40 mg by mouth daily.  04/20/18  Yes [name], MD  gabapentin (NEURONTIN) 400 MG capsule Take 1 capsule (400 mg total) by mouth 3 (three) times daily. 04/12/16  Yes [name], MD  glimepiride (AMARYL) 4 MG tablet TAKE 1 TABLET (4 MG TOTAL) BY MOUTH 2 (TWO) TIMES DAILY. 06/13/18  Yes [name], MD  glucose blood test strip Check blood sugar daily. Use as instructed 09/26/16  Yes [name], MD  JARDIANCE 25 MG TABS tablet TAKE 25 MG BY MOUTH DAILY. 03/20/18  Yes [name], MD  levothyroxine (SYNTHROID, LEVOTHROID) 88 MCG tablet TAKE 1 TABLET BY MOUTH  EVERY DAY Patient taking differently: Take 88 mcg by mouth daily before breakfast.  03/20/18  Yes [name], MD  losartan (COZAAR) 25 MG tablet Take 1 tablet (25 mg total) by mouth daily. 09/05/18  Yes [name], MD  metFORMIN (GLUCOPHAGE) 1000 MG tablet TAKE 1 TABLET BY MOUTH TWICE A DAY Patient taking differently: Take 1,000 mg by mouth 2 (two) times daily.  01/10/18  Yes [name], MD  nystatin cream (MYCOSTATIN) Apply 1 application topically 2 (two) times daily. Patient taking differently: Apply 1 application topically 2 (two) times daily as needed (for diabetic yeast reactions.).  10/04/17  Yes [name], MD  ondansetron (ZOFRAN-ODT) 4 MG disintegrating tablet Take 4 mg by mouth every 8 (eight) hours as needed for nausea or vomiting.   Yes  [provider]  ONE TOUCH LANCETS MISC 1 each by Does not apply route daily. 08/13/15  Yes [name], MD  pioglitazone (ACTOS) 30 MG tablet TAKE 1 TABLET BY MOUTH EVERY DAY Patient taking differently: Take 30 mg by mouth daily.  09/04/18  Yes [name], MD  Semaglutide [facility]) 0.25 or 0.5 MG/DOSE SOPN Inject 0.5 Syringes into the skin once a week. Patient taking differently: Inject 0.5 mg into the skin every Thursday.  11/01/17  Yes [name], MD  doxycycline (VIBRA-TABS) 100 MG tablet Take 100 mg by mouth 2 (two) times daily. 09/03/18   [provider]  omeprazole (PRILOSEC) 20 MG capsule Take 20 mg by mouth daily as needed (for acid reflux/indigestion.).  10/05/14   [provider]    Allergies as of 09/13/2018 - Review Complete 09/12/2018  Allergen Reaction Noted  . Bacitracin-neomycin-polymyxin  06/28/2015  . Clarithromycin Other (See Comments), Nausea Only, and Nausea And Vomiting   . Codeine  12/01/2011  . Dilaudid  [hydromorphone hcl] Nausea And Vomiting 06/28/2015  . Iodine  06/28/2015  . Iohexol  03/05/2007  . Neomycin-bacitracin zn-polymyx    . Tamiflu  [oseltamivir phosphate]  06/28/2015  . Zolpidem Nausea And Vomiting and Other (See Comments) 07/26/2015  . Benzalkonium chloride Itching, Rash, and Swelling 07/26/2015  . Lidocaine hcl Itching, Rash, and Swelling 06/28/2015  . Morphine Nausea Only, Rash, and Nausea And Vomiting 12/01/2011  . Tape Itching and Rash 07/12/2016  . Tapentadol Rash 07/12/2016    Family History  Problem Relation Age of Onset  . Stroke Mother   . Hypertension Mother   . Heart disease Mother   . Arthritis Mother   . Heart disease Father   . Hypertension Father   . Diabetes Sister   . Hypertension Sister   . Asthma Sister   . Hypertension Sister   . Diverticulitis Sister   . Colon cancer Maternal Grandmother   . Breast cancer Maternal Grandmother   . Breast cancer Maternal Aunt       Social History   Socioeconomic History  . Marital status: Divorced    Spouse name: Not on file  . Number of children: Not on file  . Years of education: Not on file  . Highest education level: Not on file  Occupational History  . Not on file  Social Needs  . Financial resource strain: Not on file  . Food insecurity:    Worry: Not on file    Inability: Not on file  . Transportation needs:    Medical: Not on file    Non-medical: Not on file  Tobacco Use  .  Smoking status: Never Smoker  . Smokeless tobacco: Never Used  Substance and Sexual Activity  . Alcohol use: No    Alcohol/week: 0.0 standard drinks  . Drug use: Never  . Sexual activity: Not on file  Lifestyle  . Physical activity:    Days per week: Not on file    Minutes per session: Not on file  . Stress: Not on file  Relationships  . Social connections:    Talks on phone: Not on file    Gets together: Not on file    Attends religious service: Not on file    Active member of club or organization: Not on file    Attends meetings of clubs or organizations: Not on file    Relationship status: Not on file  . Intimate partner violence:    Fear of current or ex partner: Not on file    Emotionally abused: Not on file    Physically abused: Not on file    Forced sexual activity: Not on file  Other Topics Concern  . Not on file  Social History Narrative  . Not on file    Review of Systems: See HPI, otherwise negative ROS  Physical Exam: BP (!) 163/78   Pulse (!) 58   Temp 98.2 F (36.8 C) (Tympanic)   Resp 16   Ht 5\' 2"  (1.575 m)   Wt 86.2 kg   SpO2 98%   BMI 34.75 kg/m  General:   Alert,  pleasant and cooperative in NAD Head:  Normocephalic and atraumatic. Neck:  Supple; no masses or thyromegaly. Lungs:  Clear throughout to auscultation, normal respiratory effort.    Heart:  +S1, +S2, Regular rate and rhythm, No edema. Abdomen:  Soft, nontender and nondistended. Normal bowel sounds, without  guarding, and without rebound.   Neurologic:  Alert and  oriented x4;  grossly normal neurologically.  Impression/Plan: Teresa [name] is here for a colonoscopy to be performed for average risk screening.  Risks, benefits, limitations, and alternatives regarding  colonoscopy have been reviewed with the patient.  Questions have been answered.  All parties agreeable.   Virgel Manifold, MD  09/27/2018, 11:26 AM

## 2018-09-27 NOTE — Anesthesia Post-op Follow-up Note (Signed)
Anesthesia QCDR form completed.        

## 2018-09-27 NOTE — Op Note (Signed)
[facility] Patient Name: Colleene Swarthout Procedure Date: 09/27/2018 12:03 PM MRN: [id number] Account #: [id number] Date of Birth: [birth year] Admit Type: Ambulatory Age: 73 Room: [facility] ENDO ROOM 2 Gender: Female Note Status: Finalized Procedure:            Colonoscopy Indications:          Screening for colorectal malignant neoplasm Providers:            Lyn Joens B. Bonna Gains MD, MD Referring MD:         Janine Ores. Rosanna Randy, MD (Referring MD) Medicines:            Monitored Anesthesia Care Complications:        No immediate complications. Procedure:            Pre-Anesthesia Assessment:                       - ASA Grade Assessment: II - A patient with mild                        systemic disease.                       - Prior to the procedure, a History and Physical was                        performed, and patient medications, allergies and                        sensitivities were reviewed. The patient's tolerance of                        previous anesthesia was reviewed.                       - The risks and benefits of the procedure and the                        sedation options and risks were discussed with the                        patient. All questions were answered and informed                        consent was obtained.                       - Patient identification and proposed procedure were                        verified prior to the procedure by the physician, the                        nurse, the anesthesiologist, the anesthetist and the                        technician. The procedure was verified in the procedure                        room.  After obtaining informed consent, the colonoscope was                        passed under direct vision. Throughout the procedure,                        the patient's blood pressure, pulse, and oxygen                        saturations were monitored continuously. The                        Colonoscope was introduced through the anus and                        advanced to the the cecum, identified by appendiceal                        orifice and ileocecal valve. The colonoscopy was                        performed with ease. The patient tolerated the                        procedure well. The quality of the bowel preparation                        was good. Findings:      The perianal and digital rectal examinations were normal.      A 6 mm polyp was found in the cecum. The polyp was sessile. The polyp       was removed with a cold snare. Resection and retrieval were complete.      A localized area of mildly thickened folds of the mucosa was found at       the hepatic flexure. Biopsies were taken with a cold forceps for       histology.      Multiple diverticula were found in the sigmoid colon.      A 4 mm polyp was found in the sigmoid colon. The polyp was sessile. The       polyp was removed with a cold biopsy forceps. Resection and retrieval       were complete.      The exam was otherwise without abnormality.      The rectum, sigmoid colon, descending colon, transverse colon, ascending       colon and cecum appeared normal.      The retroflexed view of the distal rectum and anal verge was normal and       showed no anal or rectal abnormalities.      The colon (entire examined portion) was tortuous. Advancing the scope       required changing the patient to a prone position, using manual       pressure, withdrawing and reinserting the scope and straightening and       shortening the scope to obtain bowel loop reduction. Due to this, the       procedure took longer than normal with a total procedure tiem of over an       hour. Impression:           - One 6 mm polyp in the cecum, removed with  a cold                        snare. Resected and retrieved.                       - Thickened folds of the mucosa at the hepatic flexure.                         Biopsied.                       - Diverticulosis in the sigmoid colon.                       - One 4 mm polyp in the sigmoid colon, removed with a                        cold biopsy forceps. Resected and retrieved.                       - The examination was otherwise normal.                       - The rectum, sigmoid colon, descending colon,                        transverse colon, ascending colon and cecum are normal.                       - The distal rectum and anal verge are normal on                        retroflexion view.                       - Tortuous colon. Recommendation:       - Discharge patient to home (with escort).                       - Advance diet as tolerated.                       - Continue present medications.                       - Await pathology results.                       - Repeat colonoscopy in 5 years.                       - The findings and recommendations were discussed with                        the patient.                       - The findings and recommendations were discussed with                        the patient's family.                       -  Return to primary care physician as previously                        scheduled.                       - High fiber diet. Procedure Code(s):    --- Professional ---                       [phone], Colonoscopy, flexible; with removal of tumor(s),                        polyp(s), or other lesion(s) by snare technique                       45380, 70, Colonoscopy, flexible; with biopsy, single                        or multiple Diagnosis Code(s):    --- Professional ---                       Z12.11, Encounter for screening for malignant neoplasm                        of colon                       D12.0, Benign neoplasm of cecum                       D12.5, Benign neoplasm of sigmoid colon                       K63.89, Other specified diseases of intestine                       K57.30,  Diverticulosis of large intestine without                        perforation or abscess without bleeding                       Q43.8, Other specified congenital malformations of                        intestine CPT copyright 2018 American Medical Association. All rights reserved. The codes documented in this report are preliminary and upon coder review may  be revised to meet current compliance requirements.  Vonda Antigua, MD Margretta Sidle B. Bonna Gains MD, MD 09/27/2018 1:54:36 PM This report has been signed electronically. Number of Addenda: 0 Note Initiated On: 09/27/2018 12:03 PM Scope Withdrawal Time: 0 hours 19 minutes 40 seconds  Total Procedure Duration: 1 hour 11 minutes 12 seconds  Estimated Blood Loss: Estimated blood loss: none.      [facility]

## 2018-09-27 NOTE — Anesthesia Preprocedure Evaluation (Signed)
Anesthesia Evaluation  Patient identified by MRN, date of birth, ID band Patient awake    Reviewed: Allergy & Precautions, NPO status , Patient's Chart, lab work & pertinent test results  History of Anesthesia Complications (+) PONV and history of anesthetic complications  Airway Mallampati: III       Dental   Pulmonary sleep apnea and Continuous Positive Airway Pressure Ventilation , neg COPD,           Cardiovascular hypertension, Pt. on medications + Past MI  (-) CHF (-) dysrhythmias + Valvular Problems/Murmurs (murmur, no tx)      Neuro/Psych neg Seizures Anxiety Depression    GI/Hepatic Neg liver ROS, hiatal hernia, GERD  Medicated and Controlled,  Endo/Other  diabetes, Type 2, Oral Hypoglycemic AgentsHypothyroidism   Renal/GU Renal disease (stones)     Musculoskeletal   Abdominal   Peds  Hematology   Anesthesia Other Findings   Reproductive/Obstetrics                             Anesthesia Physical Anesthesia Plan  ASA: III  Anesthesia Plan: General   Post-op Pain Management:    Induction: Intravenous  PONV Risk Score and Plan: 4 or greater and TIVA, Propofol infusion, Ondansetron and Midazolam  Airway Management Planned: Nasal Cannula  Additional Equipment:   Intra-op Plan:   Post-operative Plan:   Informed Consent: I have reviewed the patients History and Physical, chart, labs and discussed the procedure including the risks, benefits and alternatives for the proposed anesthesia with the patient or authorized representative who has indicated his/her understanding and acceptance.       Plan Discussed with:   Anesthesia Plan Comments:         Anesthesia Quick Evaluation

## 2018-09-30 ENCOUNTER — Encounter: Payer: Self-pay | Admitting: Gastroenterology

## 2018-09-30 LAB — SURGICAL PATHOLOGY

## 2018-10-02 ENCOUNTER — Encounter: Payer: Self-pay | Admitting: Gastroenterology

## 2018-10-03 DIAGNOSIS — H2511 Age-related nuclear cataract, right eye: Secondary | ICD-10-CM | POA: Diagnosis not present

## 2018-10-07 ENCOUNTER — Other Ambulatory Visit: Payer: Self-pay | Admitting: Family Medicine

## 2018-10-07 MED ORDER — SEMAGLUTIDE(0.25 OR 0.5MG/DOS) 2 MG/1.5ML ~~LOC~~ SOPN: 0.5000 | PEN_INJECTOR | SUBCUTANEOUS | 11 refills | Status: DC

## 2018-10-07 NOTE — Telephone Encounter (Signed)
Patient needs refill on Ozempic sent to [facility] in [place]

## 2018-10-09 ENCOUNTER — Encounter: Payer: Self-pay | Admitting: *Deleted

## 2018-10-12 NOTE — Anesthesia Postprocedure Evaluation (Signed)
Anesthesia Post Note  Patient: Teresa [name]  Procedure(s) Performed: COLONOSCOPY WITH PROPOFOL (N/A )  Patient location during evaluation: PACU Anesthesia Type: General Level of consciousness: awake and alert Pain management: pain level controlled Vital Signs Assessment: post-procedure vital signs reviewed and stable Respiratory status: spontaneous breathing, nonlabored ventilation, respiratory function stable and patient connected to nasal cannula oxygen Cardiovascular status: blood pressure returned to baseline and stable Postop Assessment: no apparent nausea or vomiting Anesthetic complications: no     Last Vitals:  Vitals:   09/27/18 1344 09/27/18 1414  BP:  (!) 163/82  Pulse:    Resp:    Temp: (!) 36.3 C   SpO2:      Last Pain:  Vitals:   09/27/18 1414  TempSrc:   PainSc: [place] Vernita Tague

## 2018-10-15 ENCOUNTER — Encounter: Payer: Self-pay | Admitting: *Deleted

## 2018-10-15 ENCOUNTER — Ambulatory Visit: Payer: PPO | Admitting: Anesthesiology

## 2018-10-15 ENCOUNTER — Other Ambulatory Visit: Payer: Self-pay

## 2018-10-15 ENCOUNTER — Encounter
Admission: RE | Disposition: A | Discharge status: Home / Self Care | Payer: Self-pay | Source: Home / Self Care | Attending: Ophthalmology

## 2018-10-15 ENCOUNTER — Ambulatory Visit
Admission: RE | Admit: 2018-10-15 | Discharge: 2018-10-15 | Disposition: A | Discharge status: Home / Self Care | Payer: PPO | Attending: Ophthalmology | Admitting: Ophthalmology

## 2018-10-15 DIAGNOSIS — F419 Anxiety disorder, unspecified: Secondary | ICD-10-CM | POA: Diagnosis not present

## 2018-10-15 DIAGNOSIS — Z885 Allergy status to narcotic agent status: Secondary | ICD-10-CM | POA: Insufficient documentation

## 2018-10-15 DIAGNOSIS — Z91048 Other nonmedicinal substance allergy status: Secondary | ICD-10-CM | POA: Insufficient documentation

## 2018-10-15 DIAGNOSIS — R42 Dizziness and giddiness: Secondary | ICD-10-CM | POA: Diagnosis not present

## 2018-10-15 DIAGNOSIS — I252 Old myocardial infarction: Secondary | ICD-10-CM | POA: Insufficient documentation

## 2018-10-15 DIAGNOSIS — M109 Gout, unspecified: Secondary | ICD-10-CM | POA: Diagnosis not present

## 2018-10-15 DIAGNOSIS — E7800 Pure hypercholesterolemia, unspecified: Secondary | ICD-10-CM | POA: Diagnosis not present

## 2018-10-15 DIAGNOSIS — G4730 Sleep apnea, unspecified: Secondary | ICD-10-CM | POA: Insufficient documentation

## 2018-10-15 DIAGNOSIS — M349 Systemic sclerosis, unspecified: Secondary | ICD-10-CM | POA: Diagnosis not present

## 2018-10-15 DIAGNOSIS — Z9842 Cataract extraction status, left eye: Secondary | ICD-10-CM | POA: Insufficient documentation

## 2018-10-15 DIAGNOSIS — Z90710 Acquired absence of both cervix and uterus: Secondary | ICD-10-CM | POA: Diagnosis not present

## 2018-10-15 DIAGNOSIS — I10 Essential (primary) hypertension: Secondary | ICD-10-CM | POA: Diagnosis not present

## 2018-10-15 DIAGNOSIS — E1136 Type 2 diabetes mellitus with diabetic cataract: Secondary | ICD-10-CM | POA: Diagnosis not present

## 2018-10-15 DIAGNOSIS — H2511 Age-related nuclear cataract, right eye: Secondary | ICD-10-CM | POA: Insufficient documentation

## 2018-10-15 DIAGNOSIS — I110 Hypertensive heart disease with heart failure: Secondary | ICD-10-CM | POA: Diagnosis not present

## 2018-10-15 DIAGNOSIS — E1140 Type 2 diabetes mellitus with diabetic neuropathy, unspecified: Secondary | ICD-10-CM | POA: Diagnosis not present

## 2018-10-15 DIAGNOSIS — I209 Angina pectoris, unspecified: Secondary | ICD-10-CM | POA: Diagnosis not present

## 2018-10-15 DIAGNOSIS — K219 Gastro-esophageal reflux disease without esophagitis: Secondary | ICD-10-CM | POA: Diagnosis not present

## 2018-10-15 DIAGNOSIS — G43909 Migraine, unspecified, not intractable, without status migrainosus: Secondary | ICD-10-CM | POA: Diagnosis not present

## 2018-10-15 DIAGNOSIS — R0601 Orthopnea: Secondary | ICD-10-CM | POA: Insufficient documentation

## 2018-10-15 DIAGNOSIS — K449 Diaphragmatic hernia without obstruction or gangrene: Secondary | ICD-10-CM | POA: Insufficient documentation

## 2018-10-15 DIAGNOSIS — F329 Major depressive disorder, single episode, unspecified: Secondary | ICD-10-CM | POA: Insufficient documentation

## 2018-10-15 DIAGNOSIS — E039 Hypothyroidism, unspecified: Secondary | ICD-10-CM | POA: Diagnosis not present

## 2018-10-15 DIAGNOSIS — Z888 Allergy status to other drugs, medicaments and biological substances status: Secondary | ICD-10-CM | POA: Insufficient documentation

## 2018-10-15 DIAGNOSIS — E1142 Type 2 diabetes mellitus with diabetic polyneuropathy: Secondary | ICD-10-CM | POA: Diagnosis not present

## 2018-10-15 DIAGNOSIS — I509 Heart failure, unspecified: Secondary | ICD-10-CM | POA: Diagnosis not present

## 2018-10-15 HISTORY — PX: CATARACT EXTRACTION W/PHACO: SHX586

## 2018-10-15 LAB — GLUCOSE, CAPILLARY: Glucose-Capillary: 161 mg/dL — ABNORMAL HIGH (ref 70–99)

## 2018-10-15 SURGERY — PHACOEMULSIFICATION, CATARACT, WITH IOL INSERTION
Anesthesia: Monitor Anesthesia Care | Site: Eye | Laterality: Right

## 2018-10-15 MED ORDER — MIDAZOLAM HCL 2 MG/2ML IJ SOLN
INTRAMUSCULAR | Status: AC
  Filled 2018-10-15: qty 2

## 2018-10-15 MED ORDER — TETRACAINE HCL 0.5 % OP SOLN
OPHTHALMIC | Status: AC
  Filled 2018-10-15: qty 4

## 2018-10-15 MED ORDER — ARMC OPHTHALMIC DILATING DROPS
1.0000 "application " | OPHTHALMIC | Status: AC
  Administered 2018-10-15 (×3): 1 via OPHTHALMIC

## 2018-10-15 MED ORDER — ONDANSETRON HCL 4 MG/2ML IJ SOLN
INTRAMUSCULAR | Status: DC | PRN
  Administered 2018-10-15: 4 mg via INTRAVENOUS

## 2018-10-15 MED ORDER — TETRACAINE HCL 0.5 % OP SOLN
1.0000 [drp] | OPHTHALMIC | Status: AC | PRN
  Administered 2018-10-15 (×3): 1 [drp] via OPHTHALMIC

## 2018-10-15 MED ORDER — MOXIFLOXACIN HCL 0.5 % OP SOLN: 1.0000 [drp] | OPHTHALMIC | Status: DC | PRN

## 2018-10-15 MED ORDER — MOXIFLOXACIN HCL 0.5 % OP SOLN
OPHTHALMIC | Status: AC
  Filled 2018-10-15: qty 3

## 2018-10-15 MED ORDER — MOXIFLOXACIN HCL 0.5 % OP SOLN
OPHTHALMIC | Status: DC | PRN
  Administered 2018-10-15: 0.2 mL via OPHTHALMIC

## 2018-10-15 MED ORDER — SODIUM CHLORIDE 0.9 % IV SOLN
INTRAVENOUS | Status: DC
  Administered 2018-10-15 (×3): via INTRAVENOUS

## 2018-10-15 MED ORDER — FENTANYL CITRATE (PF) 100 MCG/2ML IJ SOLN
INTRAMUSCULAR | Status: DC | PRN
  Administered 2018-10-15: 25 ug via INTRAVENOUS

## 2018-10-15 MED ORDER — POVIDONE-IODINE 5 % OP SOLN
OPHTHALMIC | Status: AC
  Filled 2018-10-15: qty 30

## 2018-10-15 MED ORDER — CARBACHOL 0.01 % IO SOLN
INTRAOCULAR | Status: DC | PRN
  Administered 2018-10-15: 0.5 mL via INTRAOCULAR

## 2018-10-15 MED ORDER — LIDOCAINE HCL (PF) 4 % IJ SOLN
INTRAOCULAR | Status: DC | PRN
  Administered 2018-10-15: 4 mL via OPHTHALMIC

## 2018-10-15 MED ORDER — ARMC OPHTHALMIC DILATING DROPS
OPHTHALMIC | Status: AC
  Filled 2018-10-15: qty 0.5

## 2018-10-15 MED ORDER — FENTANYL CITRATE (PF) 100 MCG/2ML IJ SOLN
INTRAMUSCULAR | Status: AC
  Filled 2018-10-15: qty 2

## 2018-10-15 MED ORDER — EPINEPHRINE PF 1 MG/ML IJ SOLN
INTRAOCULAR | Status: DC | PRN
  Administered 2018-10-15: 10:00:00 via OPHTHALMIC

## 2018-10-15 MED ORDER — NA CHONDROIT SULF-NA HYALURON 40-17 MG/ML IO SOLN
INTRAOCULAR | Status: AC
  Filled 2018-10-15: qty 1

## 2018-10-15 MED ORDER — LIDOCAINE HCL (PF) 4 % IJ SOLN
INTRAMUSCULAR | Status: AC
  Filled 2018-10-15: qty 5

## 2018-10-15 MED ORDER — NA CHONDROIT SULF-NA HYALURON 40-17 MG/ML IO SOLN
INTRAOCULAR | Status: DC | PRN
  Administered 2018-10-15: 1 mL via INTRAOCULAR

## 2018-10-15 MED ORDER — POVIDONE-IODINE 5 % OP SOLN
OPHTHALMIC | Status: DC | PRN
  Administered 2018-10-15: 1 via OPHTHALMIC

## 2018-10-15 MED ORDER — EPINEPHRINE PF 1 MG/ML IJ SOLN
INTRAMUSCULAR | Status: AC
  Filled 2018-10-15: qty 2

## 2018-10-15 MED ORDER — MIDAZOLAM HCL 2 MG/2ML IJ SOLN
INTRAMUSCULAR | Status: DC | PRN
  Administered 2018-10-15 (×3): 0.5 mg via INTRAVENOUS

## 2018-10-15 SURGICAL SUPPLY — 18 items
GLOVE BIO SURGEON STRL SZ8 (GLOVE) ×2 IMPLANT
GLOVE BIOGEL M 6.5 STRL (GLOVE) ×2 IMPLANT
GLOVE SURG LX 8.0 MICRO (GLOVE) ×1
GLOVE SURG LX STRL 8.0 MICRO (GLOVE) ×1 IMPLANT
GOWN STRL REUS W/ TWL LRG LVL3 (GOWN DISPOSABLE) ×2 IMPLANT
GOWN STRL REUS W/TWL LRG LVL3 (GOWN DISPOSABLE) ×2
LABEL CATARACT MEDS ST (LABEL) ×2 IMPLANT
LENS IOL ACRSF IQ TRC 3 24.0 IMPLANT
LENS IOL ACRYSOF IQ TORIC 24.0 ×1 IMPLANT
LENS IOL IQ TORIC 3 24.0 ×1 IMPLANT
PACK CATARACT (MISCELLANEOUS) ×2 IMPLANT
PACK CATARACT BRASINGTON LX (MISCELLANEOUS) ×2 IMPLANT
PACK EYE AFTER SURG (MISCELLANEOUS) ×2 IMPLANT
SOL BSS BAG (MISCELLANEOUS) ×2
SOLUTION BSS BAG (MISCELLANEOUS) ×1 IMPLANT
SYR 5ML LL (SYRINGE) ×2 IMPLANT
WATER STERILE IRR 250ML POUR (IV SOLUTION) ×2 IMPLANT
WIPE NON LINTING 3.25X3.25 (MISCELLANEOUS) ×2 IMPLANT

## 2018-10-15 NOTE — H&P (Signed)
All labs reviewed. Abnormal studies sent to patients PCP when indicated.  Previous H&P reviewed, patient examined, there are NO CHANGES.  Teresa Rieke Porfilio3/10/202010:17 AM

## 2018-10-15 NOTE — Discharge Instructions (Addendum)
Eye Surgery Discharge Instructions  Expect mild scratchy sensation or mild soreness. DO NOT RUB YOUR EYE!  The day of surgery:  Minimal physical activity, but bed rest is not required  No reading, computer work, or close hand work  No bending, lifting, or straining.  May watch TV  For 24 hours:  No driving, legal decisions, or alcoholic beverages  Safety precautions  Eat anything you prefer: It is better to start with liquids, then soup then solid foods.  Solar shield eyeglasses should be worn for comfort in the sunlight/patch while sleeping  Resume all regular medications including aspirin or Coumadin if these were discontinued prior to surgery. You may shower, bathe, shave, or wash your hair. Tylenol may be taken for mild discomfort. FOLLOW DR. PORFILIO'S EYE DROP INSTRUCTION SHEET AS REVIEWED.  Call your doctor if you experience significant pain, nausea, or vomiting, fever > 101 or other signs of infection. [phone] or [phone] Specific instructions:  Follow-up Information    Birder Robson, MD Follow up.   Specialty:  Ophthalmology Why:  10/16/18 @ 3:45 pm Contact information: [address] [place] [address] [phone]

## 2018-10-15 NOTE — Transfer of Care (Signed)
Immediate Anesthesia Transfer of Care Note  Patient: Teresa [name]  Procedure(s) Performed: CATARACT EXTRACTION PHACO AND INTRAOCULAR LENS PLACEMENT (IOC)-RIGHT (Right Eye)  Patient Location: PACU  Anesthesia Type:MAC  Level of Consciousness: awake, alert  and oriented  Airway & Oxygen Therapy: Patient Spontanous Breathing  Post-op Assessment: Report given to RN and Post -op Vital signs reviewed and stable  Post vital signs: Reviewed and stable  Last Vitals:  Vitals Value Taken Time  BP    Temp 37.1 C 10/15/2018 10:46 AM  Pulse 55 10/15/2018 10:46 AM  Resp 16 10/15/2018 10:46 AM  SpO2 97 % 10/15/2018 10:46 AM    Last Pain:  Vitals:   10/15/18 1046  TempSrc: Tympanic  PainSc: 0-No pain         Complications: No apparent anesthesia complications

## 2018-10-15 NOTE — Anesthesia Post-op Follow-up Note (Signed)
Anesthesia QCDR form completed.        

## 2018-10-15 NOTE — Anesthesia Preprocedure Evaluation (Addendum)
Anesthesia Evaluation  Patient identified by MRN, date of birth, ID band Patient awake    Reviewed: Allergy & Precautions, H&P , NPO status , Patient's Chart, lab work & pertinent test results  History of Anesthesia Complications (+) PONV and history of anesthetic complications  Airway Mallampati: II  TM Distance: >3 FB     Dental  (+) Chipped, Missing   Pulmonary sleep apnea and Continuous Positive Airway Pressure Ventilation , neg COPD,           Cardiovascular Exercise Tolerance: Poor hypertension, Pt. on medications + angina (pt reports angina related to anxiety and exertion, states Dr. Clayborn Bigness is aware) + CAD and + Past MI  (-) CHF (-) dysrhythmias + Valvular Problems/Murmurs (murmur, no tx)   Pt reports h/o chronic angina and SOB of which her cardiologist is aware.  No recent worsening of symptoms and pt tolerated colonoscopy and cataract removal within past month without any issues.  Normal NM stress echo in 2016.   Neuro/Psych  Headaches, neg Seizures PSYCHIATRIC DISORDERS Anxiety Depression    GI/Hepatic negative GI ROS, Neg liver ROS, hiatal hernia, GERD  Medicated and Controlled,  Endo/Other  negative endocrine ROSdiabetes, Type 2, Oral Hypoglycemic AgentsHypothyroidism   Renal/GU Renal disease (stones)     Musculoskeletal  (+) Arthritis ,   Abdominal   Peds  Hematology negative hematology ROS (+)   Anesthesia Other Findings scleroderma  Past Medical History: No date: Complication of anesthesia No date: Depression No date: Diabetes mellitus without complication ([facility]) No date: Dyspnea     Comment:  DOE No date: Edema     Comment:  FEET/LEGS No date: GERD (gastroesophageal reflux disease) No date: Gout No date: Headache No date: Heart murmur No date: History of hiatal hernia No date: History of orthopnea No date: Hypertension No date: Hypothyroidism No date: PONV (postoperative nausea and  vomiting) No date: Scleroderma ([facility])     Comment:  hands No date: Sleep apnea     Comment:  NO CPAP No date: Thyroid disease No date: Vertigo  Past Surgical History: No date: ABDOMINAL HYSTERECTOMY     Comment:  ovaries intact No date: BLADDER SURGERY     Comment:  bladder tuck 03/20/2016: BREAST BIOPSY; Right     Comment:  path pending x 2 area No date: CARDIAC CATHETERIZATION No date: CARPAL TUNNEL RELEASE 09/17/2018: CATARACT EXTRACTION W/PHACO; Left     Comment:  Procedure: CATARACT EXTRACTION PHACO AND INTRAOCULAR               LENS PLACEMENT (IOC) LEFT, DIABETIC;  Surgeon: Birder Robson, MD;  Location: [facility] ORS;  Service:               Ophthalmology;  Laterality: Left;  [place] 00:34 CDE               4.85 Fluid pack lot # [id number] 09/27/2018: COLONOSCOPY WITH PROPOFOL; N/A     Comment:  Procedure: COLONOSCOPY WITH PROPOFOL;  Surgeon:               Virgel Manifold, MD;  Location: [facility] ENDOSCOPY;                Service: Endoscopy;  Laterality: N/A; No date: DILATION AND CURETTAGE OF UTERUS No date: EYE SURGERY     Comment:  eyelid No date: FRACTURE SURGERY     Comment:  left ankle-plate and screws palced No date:  LITHOTRIPSY No date: NISSEN FUNDOPLICATION No date: SHOULDER SURGERY; Right No date: TONSILLECTOMY  BMI    Body Mass Index:  34.68 kg/m      Reproductive/Obstetrics negative OB ROS                           Anesthesia Physical  Anesthesia Plan  ASA: III  Anesthesia Plan: MAC   Post-op Pain Management:    Induction: Intravenous  PONV Risk Score and Plan: 3 and Ondansetron and Midazolam  Airway Management Planned: Nasal Cannula and Natural Airway  Additional Equipment:   Intra-op Plan:   Post-operative Plan:   Informed Consent: I have reviewed the patients History and Physical, chart, labs and discussed the procedure including the risks, benefits and alternatives for the proposed anesthesia with the  patient or authorized representative who has indicated his/her understanding and acceptance.       Plan Discussed with: Anesthesiologist and CRNA  Anesthesia Plan Comments:         Anesthesia Quick Evaluation

## 2018-10-15 NOTE — Op Note (Signed)
PREOPERATIVE DIAGNOSIS:  Nuclear sclerotic cataract of the right eye.   POSTOPERATIVE DIAGNOSIS:  Nuclear sclerotic cataract of the right eye.   OPERATIVE PROCEDURE: Procedure(s): CATARACT EXTRACTION PHACO AND INTRAOCULAR LENS PLACEMENT (IOC)-RIGHT   SURGEON:  Birder Robson, MD.   ANESTHESIA: 1.      Managed anesthesia care. 2.     0.88ml of Shugarcaine was instilled following the paracentesis  Anesthesiologist: Durenda Hurt, MD CRNA: Willette Alma, CRNA  COMPLICATIONS:  None.   TECHNIQUE:   Stop and chop    DESCRIPTION OF PROCEDURE:  The patient was examined and consented in the preoperative holding area where the aforementioned topical anesthesia was applied to the right eye.  The patient was brought back to the Operating Room where he was sat upright on the gurney and given a target to fixate upon while the eye was marked at the 3:00 and 9:00 position.  The patient was then reclined on the operating table.  The eye was prepped and draped in the usual sterile ophthalmic fashion and a lid speculum was placed. A paracentesis was created with the side port blade and the anterior chamber was filled with viscoelastic. A near clear corneal incision was performed with the steel keratome. A continuous curvilinear capsulorrhexis was performed with a cystotome followed by the capsulorrhexis forceps. Hydrodissection and hydrodelineation were carried out with BSS on a blunt cannula. The lens was removed in a stop and chop technique and the remaining cortical material was removed with the irrigation-aspiration handpiece. The eye was inflated with viscoelastic and the [id number]  lens  was placed in the eye and rotated to within a few degrees of the predetermined orientation.  The remaining viscoelastic was removed from the eye.  The Sinskey hook was used to rotate the toric lens into its final resting place at 020 degrees.  The eye was inflated to a physiologic pressure and found to be watertight.  0.33ml of Vigamox was placed in the anterior chamber.  The eye was dressed with Vigamox. The patient was given protective glasses to wear throughout the day and a shield with which to sleep tonight. The patient was also given drops with which to begin a drop regimen today and will follow-up with me in one day. Implant Name Type Inv. Item Serial No. Manufacturer Lot No. LRB No. Used  LENS IOL TORIC 24.0 - [id number]  LENS IOL TORIC 24.0 [id number] ALCON  Right 1   Procedure(s) with comments: CATARACT EXTRACTION PHACO AND INTRAOCULAR LENS PLACEMENT (IOC)-RIGHT (Right) - [place] 00:27.6 CDE 3.43 Fluid Pack Lot # [id number]  Electronically signed: Birder Robson 10/15/2018 10:46 AM

## 2018-10-17 NOTE — Anesthesia Postprocedure Evaluation (Signed)
Anesthesia Post Note  Patient: Teresa [name]  Procedure(s) Performed: CATARACT EXTRACTION PHACO AND INTRAOCULAR LENS PLACEMENT (IOC)-RIGHT (Right Eye)  Patient location during evaluation: PACU Anesthesia Type: MAC Level of consciousness: awake and alert Pain management: pain level controlled Respiratory status: spontaneous breathing, nonlabored ventilation and respiratory function stable Postop Assessment: no apparent nausea or vomiting Anesthetic complications: no     Last Vitals:  Vitals:   10/15/18 0912 10/15/18 1046  BP: (!) 163/75 127/60  Pulse: 60 (!) 55  Resp: 16 16  Temp: 36.9 C 37.1 C  SpO2: 97% 97%    Last Pain:  Vitals:   10/15/18 1046  TempSrc: Tympanic  PainSc: 0-No pain                 Durenda Hurt

## 2018-10-30 ENCOUNTER — Ambulatory Visit: Payer: PPO | Admitting: Orthotics

## 2018-11-22 ENCOUNTER — Other Ambulatory Visit: Payer: Self-pay | Admitting: Family Medicine

## 2018-11-25 ENCOUNTER — Other Ambulatory Visit: Payer: Self-pay

## 2018-11-25 ENCOUNTER — Ambulatory Visit (INDEPENDENT_AMBULATORY_CARE_PROVIDER_SITE_OTHER): Payer: PPO | Admitting: Podiatry

## 2018-11-25 ENCOUNTER — Encounter: Payer: Self-pay | Admitting: Podiatry

## 2018-11-25 VITALS — Temp 99.0°F

## 2018-11-25 DIAGNOSIS — M19072 Primary osteoarthritis, left ankle and foot: Secondary | ICD-10-CM

## 2018-11-25 DIAGNOSIS — M2022 Hallux rigidus, left foot: Secondary | ICD-10-CM

## 2018-11-25 DIAGNOSIS — E1142 Type 2 diabetes mellitus with diabetic polyneuropathy: Secondary | ICD-10-CM

## 2018-11-25 DIAGNOSIS — M79676 Pain in unspecified toe(s): Principal | ICD-10-CM

## 2018-11-25 DIAGNOSIS — M205X1 Other deformities of toe(s) (acquired), right foot: Secondary | ICD-10-CM

## 2018-11-25 DIAGNOSIS — B351 Tinea unguium: Secondary | ICD-10-CM

## 2018-11-25 NOTE — Progress Notes (Signed)
Complaint:  Visit Type: Patient returns to my office for continued preventative foot care services. Complaint: Patient states" my nails have grown long and thick and become painful to walk and wear shoes" Patient has been diagnosed polyneuropathy  with diabetes with neuropathy..Patient is taking gabapentin. The patient presents for preventative foot care services. No changes to ROS  Podiatric Exam: Vascular: dorsalis pedis and posterior tibial pulses are palpable bilateral. Capillary return is immediate. Temperature gradient is WNL. Skin turgor WNL  Sensorium: Normal Semmes Weinstein monofilament test. Normal tactile sensation bilaterally. Nail Exam: Pt has thick disfigured discolored nails with subungual debris noted bilateral entire nail hallux through fifth toenails Ulcer Exam: There is no evidence of ulcer or pre-ulcerative changes or infection. Orthopedic Exam: Muscle tone and strength are WNL. No limitations in general ROM. No crepitus or effusions noted. Foot type and digits show no abnormalities. Dorsal lipping 1st metatarsal  B/L.  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 Skin: No Porokeratosis. No infection or ulcers  Diagnosis:  Onychomycosis, , Pain in right toe, pain in left toes.    Diabetes with neuropathy.  Arthritis right foot.  Treatment & Plan Procedures and Treatment: Consent by patient was obtained for treatment procedures.   Debridement of mycotic and hypertrophic toenails, 1 through 5 bilateral and clearing of subungual debris. No ulceration, no infection noted. Dispense diabetic shoes. Return Visit-Office Procedure: Patient instructed to return to the office for a follow up visit 3 months for continued evaluation and treatment.    Gardiner Barefoot DPM

## 2018-12-28 ENCOUNTER — Other Ambulatory Visit: Payer: Self-pay | Admitting: Family Medicine

## 2019-01-14 ENCOUNTER — Encounter: Payer: Self-pay | Admitting: Family Medicine

## 2019-01-14 ENCOUNTER — Other Ambulatory Visit: Payer: Self-pay

## 2019-01-14 ENCOUNTER — Ambulatory Visit (INDEPENDENT_AMBULATORY_CARE_PROVIDER_SITE_OTHER): Payer: PPO | Admitting: Family Medicine

## 2019-01-14 VITALS — BP 124/72 | HR 68 | Temp 98.6°F | Resp 18 | Wt 193.4 lb

## 2019-01-14 DIAGNOSIS — Z6836 Body mass index (BMI) 36.0-36.9, adult: Secondary | ICD-10-CM

## 2019-01-14 DIAGNOSIS — E119 Type 2 diabetes mellitus without complications: Secondary | ICD-10-CM

## 2019-01-14 DIAGNOSIS — E7800 Pure hypercholesterolemia, unspecified: Secondary | ICD-10-CM

## 2019-01-14 LAB — POCT GLYCOSYLATED HEMOGLOBIN (HGB A1C): Hemoglobin A1C: 7.5 % — AB (ref 4.0–5.6)

## 2019-01-14 LAB — POCT UA - MICROALBUMIN: Albumin/Creatinine Ratio, Urine, POC: 100

## 2019-01-14 NOTE — Progress Notes (Signed)
Patient: Teresa [name] Female    DOB: June 11, 1946   73 y.o.   MRN: [id number] Visit Date: 01/14/2019  Today's Provider: Wilhemena Durie, MD   Chief Complaint  Patient presents with  . Follow-up  . Diabetes   Subjective:     HPI  4 Month follow up for diabetes. Patient states that she needs a refill on Nystatin cream.  Allergies  Allergen Reactions  . Bacitracin-Neomycin-Polymyxin   . Clarithromycin Other (See Comments), Nausea Only and Nausea And Vomiting  . Codeine     Other reaction(s): Hallucination  . Dilaudid  [Hydromorphone Hcl] Nausea And Vomiting  . Iodine   . Iohexol      Desc: PT HAS HIVES/ITCHING   . Neomycin-Bacitracin Zn-Polymyx   . Tamiflu  [Oseltamivir Phosphate]     Other reaction(s): Abdominal Pain, Vomiting  . Zolpidem Nausea And Vomiting and Other (See Comments)    Hallucinations  . Benzalkonium Chloride Itching, Rash and Swelling  . Lidocaine Hcl Itching, Rash and Swelling  . Morphine Nausea Only, Rash and Nausea And Vomiting  . Tape Itching and Rash  . Tapentadol Rash     Current Outpatient Medications:  .  allopurinol (ZYLOPRIM) 100 MG tablet, TAKE 1 TABLET BY MOUTH EVERY DAY (Patient taking differently: Take 100 mg by mouth at bedtime. ), Disp: 90 tablet, Rfl: 3 .  atorvastatin (LIPITOR) 10 MG tablet, TAKE 1 TABLET BY MOUTH EVERY DAY AT BEDTIME (Patient taking differently: Take 10 mg by mouth at bedtime. ), Disp: 90 tablet, Rfl: 3 .  colchicine 0.6 MG tablet, Take 0.6 mg by mouth daily as needed (for gout flare-ups). , Disp: , Rfl:  .  Continuous Blood Gluc Sensor (FREESTYLE LIBRE 14 DAY SENSOR) MISC, USE EVERY 14 DAYS AS DIRECTED, Disp: 2 each, Rfl: 12 .  furosemide (LASIX) 40 MG tablet, TAKE 1 TABLET BY MOUTH EVERY DAY (Patient taking differently: Take 40 mg by mouth daily. ), Disp: 90 tablet, Rfl: 3 .  gabapentin (NEURONTIN) 400 MG capsule, Take 1 capsule (400 mg total) by mouth 3 (three) times daily., Disp: 270 capsule, Rfl: 3 .   glimepiride (AMARYL) 4 MG tablet, TAKE 1 TABLET (4 MG TOTAL) BY MOUTH 2 (TWO) TIMES DAILY., Disp: 180 tablet, Rfl: 3 .  glucose blood test strip, Check blood sugar daily. Use as instructed, Disp: 100 each, Rfl: 12 .  JARDIANCE 25 MG TABS tablet, TAKE 25 MG BY MOUTH DAILY., Disp: 90 tablet, Rfl: 3 .  levothyroxine (SYNTHROID, LEVOTHROID) 88 MCG tablet, TAKE 1 TABLET BY MOUTH EVERY DAY (Patient taking differently: Take 88 mcg by mouth daily before breakfast. ), Disp: 90 tablet, Rfl: 3 .  losartan (COZAAR) 25 MG tablet, Take 1 tablet (25 mg total) by mouth daily., Disp: 90 tablet, Rfl: 3 .  metFORMIN (GLUCOPHAGE) 1000 MG tablet, TAKE 1 TABLET BY MOUTH TWICE A DAY, Disp: 180 tablet, Rfl: 3 .  nystatin cream (MYCOSTATIN), Apply 1 application topically 2 (two) times daily. (Patient taking differently: Apply 1 application topically 2 (two) times daily as needed (for diabetic yeast reactions.). ), Disp: 30 g, Rfl: 0 .  ondansetron (ZOFRAN-ODT) 4 MG disintegrating tablet, Take 4 mg by mouth every 8 (eight) hours as needed for nausea or vomiting., Disp: , Rfl:  .  pioglitazone (ACTOS) 30 MG tablet, TAKE 1 TABLET BY MOUTH EVERY DAY (Patient taking differently: Take 30 mg by mouth daily. ), Disp: 90 tablet, Rfl: 3 .  Semaglutide,0.25 or 0.5MG /DOS, (OZEMPIC, 0.25  OR 0.5 MG/DOSE,) 2 MG/1.5ML SOPN, Inject 0.5 Syringes into the skin once a week., Disp: 1.5 mL, Rfl: 11 .  doxycycline (VIBRA-TABS) 100 MG tablet, Take 100 mg by mouth 2 (two) times daily., Disp: , Rfl:  .  omeprazole (PRILOSEC) 20 MG capsule, Take 20 mg by mouth daily as needed (for acid reflux/indigestion.). , Disp: , Rfl:  .  ONE TOUCH LANCETS MISC, 1 each by Does not apply route daily., Disp: 30 each, Rfl: 12  Review of Systems  Constitutional: Negative.   HENT: Negative.   Eyes: Negative.   Respiratory: Negative.   Cardiovascular: Negative.   Gastrointestinal: Negative.   Endocrine: Negative.   Genitourinary: Negative.   Musculoskeletal:  Negative.   Skin: Negative.   Allergic/Immunologic: Negative.   Neurological: Negative.   Hematological: Negative.   Psychiatric/Behavioral: Negative.   All other systems reviewed and are negative.   Social History   Tobacco Use  . Smoking status: Never Smoker  . Smokeless tobacco: Never Used  Substance Use Topics  . Alcohol use: No    Alcohol/week: 0.0 standard drinks      Objective:   BP 124/72 (BP Location: Left Arm, Patient Position: Sitting, Cuff Size: Large)   Pulse 68   Temp 98.6 F (37 C)   Resp 18   Wt 193 lb 6.4 oz (87.7 kg)   BMI 35.37 kg/m  Vitals:   01/14/19 0834  BP: 124/72  Pulse: 68  Resp: 18  Temp: 98.6 F (37 C)  Weight: 193 lb 6.4 oz (87.7 kg)     Physical Exam Vitals signs reviewed.  Constitutional:      Appearance: She is well-developed.  HENT:     Head: Normocephalic and atraumatic.  Eyes:     General: No scleral icterus.    Conjunctiva/sclera: Conjunctivae normal.  Neck:     Thyroid: No thyromegaly.  Cardiovascular:     Rate and Rhythm: Normal rate and regular rhythm.     Heart sounds: Normal heart sounds.  Pulmonary:     Effort: Pulmonary effort is normal.     Breath sounds: Normal breath sounds.  Abdominal:     Palpations: Abdomen is soft.  Skin:    General: Skin is warm and dry.  Neurological:     Mental Status: She is alert and oriented to person, place, and time.  Psychiatric:        Behavior: Behavior normal.        Thought Content: Thought content normal.        Judgment: Judgment normal.         Assessment & Plan    1. Type 2 diabetes mellitus without complication, unspecified whether long term insulin use ([place]) Lifestyle stressed for better control. Presently no hypoglycemia. - POCT HgB A1C--7.5 today - POCT UA - Microalbumin  2. Class 2 severe obesity due to excess calories with serious comorbidity and body mass index (BMI) of 36.0 to 36.9 in adult [facility]) DM/HTN/HLD.  3. HYPERCHOLESTEROLEMIA  I have  done the exam and reviewed the chart and it is accurate to the best of my knowledge. Development worker, community has been used and  any errors in dictation or transcription are unintentional. Miguel Aschoff M.D. [place], MD  [facility]

## 2019-02-24 ENCOUNTER — Other Ambulatory Visit: Payer: Self-pay

## 2019-02-24 ENCOUNTER — Ambulatory Visit (INDEPENDENT_AMBULATORY_CARE_PROVIDER_SITE_OTHER): Payer: PPO | Admitting: Podiatry

## 2019-02-24 ENCOUNTER — Encounter: Payer: Self-pay | Admitting: Podiatry

## 2019-02-24 VITALS — Temp 99.1°F

## 2019-02-24 DIAGNOSIS — M205X9 Other deformities of toe(s) (acquired), unspecified foot: Secondary | ICD-10-CM

## 2019-02-24 DIAGNOSIS — M2020 Hallux rigidus, unspecified foot: Secondary | ICD-10-CM

## 2019-02-24 DIAGNOSIS — M79676 Pain in unspecified toe(s): Secondary | ICD-10-CM | POA: Diagnosis not present

## 2019-02-24 DIAGNOSIS — E1142 Type 2 diabetes mellitus with diabetic polyneuropathy: Secondary | ICD-10-CM

## 2019-02-24 DIAGNOSIS — B351 Tinea unguium: Secondary | ICD-10-CM

## 2019-02-24 NOTE — Progress Notes (Signed)
Complaint:  Visit Type: Patient returns to my office for continued preventative foot care services. Complaint: Patient states" my nails have grown long and thick and become painful to walk and wear shoes" Patient has been diagnosed polyneuropathy  with diabetes with neuropathy..Patient is taking gabapentin. The patient presents for preventative foot care services. No changes to ROS.  Patient says she likes her diabetic shoes. Podiatric Exam: Vascular: dorsalis pedis and posterior tibial pulses are palpable bilateral. Capillary return is immediate. Temperature gradient is WNL. Skin turgor WNL  Sensorium: Normal Semmes Weinstein monofilament test. Normal tactile sensation bilaterally. Nail Exam: Pt has thick disfigured discolored nails with subungual debris noted bilateral entire nail hallux through fifth toenails Ulcer Exam: There is no evidence of ulcer or pre-ulcerative changes or infection. Orthopedic Exam: Muscle tone and strength are WNL. No limitations in general ROM. No crepitus or effusions noted. Foot type and digits show no abnormalities. Dorsal lipping 1st metatarsal  B/L.  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 Skin: No Porokeratosis. No infection or ulcers  Diagnosis:  Onychomycosis, , Pain in right toe, pain in left toes.    Diabetes with neuropathy.  Arthritis right foot.  Treatment & Plan Procedures and Treatment: Consent by patient was obtained for treatment procedures.   Debridement of mycotic and hypertrophic toenails, 1 through 5 bilateral and clearing of subungual debris. No ulceration, no infection noted.  Return Visit-Office Procedure: Patient instructed to return to the office for a follow up visit 3 months for continued evaluation and treatment.    Gardiner Barefoot DPM

## 2019-03-07 DIAGNOSIS — Z20828 Contact with and (suspected) exposure to other viral communicable diseases: Secondary | ICD-10-CM | POA: Diagnosis not present

## 2019-03-14 ENCOUNTER — Ambulatory Visit: Payer: PPO | Attending: Otolaryngology

## 2019-03-14 DIAGNOSIS — F5101 Primary insomnia: Secondary | ICD-10-CM | POA: Insufficient documentation

## 2019-03-14 DIAGNOSIS — G4733 Obstructive sleep apnea (adult) (pediatric): Secondary | ICD-10-CM | POA: Insufficient documentation

## 2019-03-26 ENCOUNTER — Telehealth: Payer: Self-pay | Admitting: Family Medicine

## 2019-03-26 NOTE — Telephone Encounter (Signed)
Pt calling for recent sleep study results.  Please call pt back asap.  Thanks, [employer]

## 2019-03-26 NOTE — Telephone Encounter (Signed)
Dr. Rosanna Randy, you did review these last week sometime. Did you want her to get the CPAP machine? Results are in the "media" section in her chart. Please advise. Thanks!

## 2019-04-10 NOTE — Telephone Encounter (Signed)
Needs CPAP at 17 cm water with heat/humidity.  thanks

## 2019-04-10 NOTE — Telephone Encounter (Signed)
Pt called for her sleep study results.  CB# [phone]  Teresa [name]

## 2019-04-10 NOTE — Telephone Encounter (Signed)
Please Review.  Thanks. 

## 2019-04-11 ENCOUNTER — Telehealth: Payer: Self-pay | Admitting: *Deleted

## 2019-04-11 NOTE — Telephone Encounter (Signed)
Patient called wanting to know if CPAP order has been sent in yet? Patient is very upset because has not heard anything. Please advise?

## 2019-04-15 NOTE — Telephone Encounter (Signed)
Advised patient that order was sent today and Teresa [name] should be giving her a call.

## 2019-04-15 NOTE — Telephone Encounter (Signed)
Orders for CPAP placed and faxed.

## 2019-04-29 DIAGNOSIS — G4733 Obstructive sleep apnea (adult) (pediatric): Secondary | ICD-10-CM | POA: Diagnosis not present

## 2019-05-08 DIAGNOSIS — J1282 Pneumonia due to coronavirus disease 2019: Secondary | ICD-10-CM

## 2019-05-08 DIAGNOSIS — U071 COVID-19: Secondary | ICD-10-CM

## 2019-05-08 HISTORY — DX: COVID-19: U07.1

## 2019-05-08 HISTORY — DX: Pneumonia due to coronavirus disease 2019: J12.82

## 2019-05-12 ENCOUNTER — Other Ambulatory Visit: Payer: Self-pay

## 2019-05-12 DIAGNOSIS — Z8616 Personal history of COVID-19: Secondary | ICD-10-CM

## 2019-05-12 DIAGNOSIS — Z20828 Contact with and (suspected) exposure to other viral communicable diseases: Secondary | ICD-10-CM

## 2019-05-12 DIAGNOSIS — Z20822 Contact with and (suspected) exposure to covid-19: Secondary | ICD-10-CM

## 2019-05-12 HISTORY — DX: Personal history of COVID-19: Z86.16

## 2019-05-14 ENCOUNTER — Other Ambulatory Visit: Payer: Self-pay | Admitting: Family Medicine

## 2019-05-14 ENCOUNTER — Telehealth: Payer: Self-pay | Admitting: Family Medicine

## 2019-05-14 DIAGNOSIS — R110 Nausea: Secondary | ICD-10-CM

## 2019-05-14 LAB — NOVEL CORONAVIRUS, NAA: SARS-CoV-2, NAA: DETECTED — AB

## 2019-05-14 MED ORDER — ONDANSETRON HCL 4 MG PO TABS: 4.0000 mg | ORAL_TABLET | Freq: Three times a day (TID) | ORAL | 0 refills | Status: DC | PRN

## 2019-05-14 NOTE — Telephone Encounter (Signed)
Ondansetron 4mg  q 8 prn nausea--#45 Covid Positive is in the message--ED if she feels short of breath.

## 2019-05-14 NOTE — Telephone Encounter (Signed)
Pt's employer sent her to have the COVID test.  It came back positive yesterday. [place] instructed her to notify doctor. She also needs to be treated for the nausea and vomiting.  Please call pt back at [phone].  Pt uses  [id number] - [place], [place] - [address] [phone] (Phone) [phone] (Fax)   Thanks, [employer]

## 2019-05-14 NOTE — Telephone Encounter (Signed)
Advised patient. Medication sent in.

## 2019-05-14 NOTE — Telephone Encounter (Signed)
Please review. Thanks!  

## 2019-05-16 ENCOUNTER — Emergency Department: Payer: PPO

## 2019-05-16 ENCOUNTER — Other Ambulatory Visit: Payer: Self-pay

## 2019-05-16 ENCOUNTER — Encounter (HOSPITAL_COMMUNITY): Payer: Self-pay | Admitting: Family Medicine

## 2019-05-16 ENCOUNTER — Emergency Department
Admission: EM | Admit: 2019-05-16 | Discharge: 2019-05-16 | Disposition: A | Discharge status: Other Acute Inpatient Hospital | Payer: PPO | Attending: Emergency Medicine | Admitting: Emergency Medicine

## 2019-05-16 ENCOUNTER — Inpatient Hospital Stay (HOSPITAL_COMMUNITY)
Admission: AD | Admit: 2019-05-16 | Discharge: 2019-05-27 | DRG: 177 | Disposition: A | Discharge status: Skilled Nursing Facility | Payer: PPO | Source: Other Acute Inpatient Hospital | Attending: Family Medicine | Admitting: Family Medicine

## 2019-05-16 DIAGNOSIS — E1165 Type 2 diabetes mellitus with hyperglycemia: Secondary | ICD-10-CM | POA: Diagnosis present

## 2019-05-16 DIAGNOSIS — E669 Obesity, unspecified: Secondary | ICD-10-CM | POA: Diagnosis present

## 2019-05-16 DIAGNOSIS — E079 Disorder of thyroid, unspecified: Secondary | ICD-10-CM | POA: Diagnosis not present

## 2019-05-16 DIAGNOSIS — M2550 Pain in unspecified joint: Secondary | ICD-10-CM | POA: Diagnosis not present

## 2019-05-16 DIAGNOSIS — I5032 Chronic diastolic (congestive) heart failure: Secondary | ICD-10-CM | POA: Diagnosis present

## 2019-05-16 DIAGNOSIS — I2510 Atherosclerotic heart disease of native coronary artery without angina pectoris: Secondary | ICD-10-CM | POA: Diagnosis present

## 2019-05-16 DIAGNOSIS — R079 Chest pain, unspecified: Secondary | ICD-10-CM | POA: Diagnosis not present

## 2019-05-16 DIAGNOSIS — Z23 Encounter for immunization: Secondary | ICD-10-CM

## 2019-05-16 DIAGNOSIS — E039 Hypothyroidism, unspecified: Secondary | ICD-10-CM | POA: Insufficient documentation

## 2019-05-16 DIAGNOSIS — Z7984 Long term (current) use of oral hypoglycemic drugs: Secondary | ICD-10-CM | POA: Diagnosis not present

## 2019-05-16 DIAGNOSIS — Z961 Presence of intraocular lens: Secondary | ICD-10-CM | POA: Diagnosis not present

## 2019-05-16 DIAGNOSIS — J1289 Other viral pneumonia: Secondary | ICD-10-CM | POA: Diagnosis not present

## 2019-05-16 DIAGNOSIS — E876 Hypokalemia: Secondary | ICD-10-CM | POA: Diagnosis not present

## 2019-05-16 DIAGNOSIS — J984 Other disorders of lung: Secondary | ICD-10-CM | POA: Diagnosis not present

## 2019-05-16 DIAGNOSIS — I10 Essential (primary) hypertension: Secondary | ICD-10-CM | POA: Insufficient documentation

## 2019-05-16 DIAGNOSIS — R112 Nausea with vomiting, unspecified: Secondary | ICD-10-CM | POA: Diagnosis not present

## 2019-05-16 DIAGNOSIS — K219 Gastro-esophageal reflux disease without esophagitis: Secondary | ICD-10-CM | POA: Diagnosis present

## 2019-05-16 DIAGNOSIS — Z79899 Other long term (current) drug therapy: Secondary | ICD-10-CM

## 2019-05-16 DIAGNOSIS — G4730 Sleep apnea, unspecified: Secondary | ICD-10-CM | POA: Diagnosis present

## 2019-05-16 DIAGNOSIS — J9601 Acute respiratory failure with hypoxia: Secondary | ICD-10-CM | POA: Diagnosis not present

## 2019-05-16 DIAGNOSIS — F329 Major depressive disorder, single episode, unspecified: Secondary | ICD-10-CM | POA: Diagnosis present

## 2019-05-16 DIAGNOSIS — Z6835 Body mass index (BMI) 35.0-35.9, adult: Secondary | ICD-10-CM

## 2019-05-16 DIAGNOSIS — U071 COVID-19: Principal | ICD-10-CM | POA: Diagnosis present

## 2019-05-16 DIAGNOSIS — Z888 Allergy status to other drugs, medicaments and biological substances status: Secondary | ICD-10-CM

## 2019-05-16 DIAGNOSIS — B373 Candidiasis of vulva and vagina: Secondary | ICD-10-CM | POA: Diagnosis not present

## 2019-05-16 DIAGNOSIS — E119 Type 2 diabetes mellitus without complications: Secondary | ICD-10-CM | POA: Insufficient documentation

## 2019-05-16 DIAGNOSIS — I110 Hypertensive heart disease with heart failure: Secondary | ICD-10-CM | POA: Diagnosis present

## 2019-05-16 DIAGNOSIS — J1282 Pneumonia due to coronavirus disease 2019: Secondary | ICD-10-CM | POA: Diagnosis present

## 2019-05-16 DIAGNOSIS — R1111 Vomiting without nausea: Secondary | ICD-10-CM | POA: Diagnosis not present

## 2019-05-16 DIAGNOSIS — E1140 Type 2 diabetes mellitus with diabetic neuropathy, unspecified: Secondary | ICD-10-CM

## 2019-05-16 DIAGNOSIS — J069 Acute upper respiratory infection, unspecified: Secondary | ICD-10-CM

## 2019-05-16 DIAGNOSIS — Z9841 Cataract extraction status, right eye: Secondary | ICD-10-CM

## 2019-05-16 DIAGNOSIS — R042 Hemoptysis: Secondary | ICD-10-CM | POA: Diagnosis present

## 2019-05-16 DIAGNOSIS — Z91048 Other nonmedicinal substance allergy status: Secondary | ICD-10-CM

## 2019-05-16 DIAGNOSIS — Z885 Allergy status to narcotic agent status: Secondary | ICD-10-CM

## 2019-05-16 DIAGNOSIS — R519 Headache, unspecified: Secondary | ICD-10-CM | POA: Diagnosis not present

## 2019-05-16 DIAGNOSIS — R41841 Cognitive communication deficit: Secondary | ICD-10-CM | POA: Diagnosis not present

## 2019-05-16 DIAGNOSIS — R5381 Other malaise: Secondary | ICD-10-CM | POA: Diagnosis not present

## 2019-05-16 DIAGNOSIS — Z8249 Family history of ischemic heart disease and other diseases of the circulatory system: Secondary | ICD-10-CM

## 2019-05-16 DIAGNOSIS — K529 Noninfective gastroenteritis and colitis, unspecified: Secondary | ICD-10-CM | POA: Diagnosis not present

## 2019-05-16 DIAGNOSIS — I4510 Unspecified right bundle-branch block: Secondary | ICD-10-CM | POA: Diagnosis present

## 2019-05-16 DIAGNOSIS — T380X5A Adverse effect of glucocorticoids and synthetic analogues, initial encounter: Secondary | ICD-10-CM | POA: Diagnosis not present

## 2019-05-16 DIAGNOSIS — R2689 Other abnormalities of gait and mobility: Secondary | ICD-10-CM | POA: Diagnosis not present

## 2019-05-16 DIAGNOSIS — R278 Other lack of coordination: Secondary | ICD-10-CM | POA: Diagnosis not present

## 2019-05-16 DIAGNOSIS — R110 Nausea: Secondary | ICD-10-CM | POA: Diagnosis not present

## 2019-05-16 DIAGNOSIS — M109 Gout, unspecified: Secondary | ICD-10-CM | POA: Diagnosis present

## 2019-05-16 DIAGNOSIS — R498 Other voice and resonance disorders: Secondary | ICD-10-CM | POA: Diagnosis not present

## 2019-05-16 DIAGNOSIS — M6281 Muscle weakness (generalized): Secondary | ICD-10-CM | POA: Diagnosis not present

## 2019-05-16 DIAGNOSIS — Z7401 Bed confinement status: Secondary | ICD-10-CM | POA: Diagnosis not present

## 2019-05-16 DIAGNOSIS — R0602 Shortness of breath: Secondary | ICD-10-CM | POA: Diagnosis not present

## 2019-05-16 DIAGNOSIS — R2681 Unsteadiness on feet: Secondary | ICD-10-CM | POA: Diagnosis not present

## 2019-05-16 DIAGNOSIS — Z833 Family history of diabetes mellitus: Secondary | ICD-10-CM

## 2019-05-16 DIAGNOSIS — Z9842 Cataract extraction status, left eye: Secondary | ICD-10-CM

## 2019-05-16 LAB — HEPATIC FUNCTION PANEL
ALT: 24 U/L (ref 0–44)
AST: 36 U/L (ref 15–41)
Albumin: 3.4 g/dL — ABNORMAL LOW (ref 3.5–5.0)
Alkaline Phosphatase: 52 U/L (ref 38–126)
Bilirubin, Direct: 0.3 mg/dL — ABNORMAL HIGH (ref 0.0–0.2)
Indirect Bilirubin: 0.9 mg/dL (ref 0.3–0.9)
Total Bilirubin: 1.2 mg/dL (ref 0.3–1.2)
Total Protein: 7.1 g/dL (ref 6.5–8.1)

## 2019-05-16 LAB — CBC WITH DIFFERENTIAL/PLATELET
Abs Immature Granulocytes: 0.02 10*3/uL (ref 0.00–0.07)
Basophils Absolute: 0 10*3/uL (ref 0.0–0.1)
Basophils Relative: 0 %
Eosinophils Absolute: 0 10*3/uL (ref 0.0–0.5)
Eosinophils Relative: 0 %
HCT: 41 % (ref 36.0–46.0)
Hemoglobin: 14 g/dL (ref 12.0–15.0)
Immature Granulocytes: 0 %
Lymphocytes Relative: 23 %
Lymphs Abs: 1.7 10*3/uL (ref 0.7–4.0)
MCH: 29.9 pg (ref 26.0–34.0)
MCHC: 34.1 g/dL (ref 30.0–36.0)
MCV: 87.4 fL (ref 80.0–100.0)
Monocytes Absolute: 0.3 10*3/uL (ref 0.1–1.0)
Monocytes Relative: 4 %
Neutro Abs: 5.3 10*3/uL (ref 1.7–7.7)
Neutrophils Relative %: 73 %
Platelets: 211 10*3/uL (ref 150–400)
RBC: 4.69 MIL/uL (ref 3.87–5.11)
RDW: 14.2 % (ref 11.5–15.5)
WBC: 7.3 10*3/uL (ref 4.0–10.5)
nRBC: 0 % (ref 0.0–0.2)

## 2019-05-16 LAB — BASIC METABOLIC PANEL
Anion gap: 17 — ABNORMAL HIGH (ref 5–15)
BUN: 10 mg/dL (ref 8–23)
CO2: 21 mmol/L — ABNORMAL LOW (ref 22–32)
Calcium: 8.4 mg/dL — ABNORMAL LOW (ref 8.9–10.3)
Chloride: 99 mmol/L (ref 98–111)
Creatinine, Ser: 0.57 mg/dL (ref 0.44–1.00)
GFR calc Af Amer: >60 mL/min (ref >60)
GFR calc non Af Amer: >60 mL/min (ref >60)
Glucose, Bld: 201 mg/dL — ABNORMAL HIGH (ref 70–99)
Potassium: 3.6 mmol/L (ref 3.5–5.1)
Sodium: 137 mmol/L (ref 135–145)

## 2019-05-16 LAB — LACTIC ACID, PLASMA
Lactic Acid, Venous: 2 mmol/L (ref 0.5–1.9)
Lactic Acid, Venous: 1.4 mmol/L (ref 0.5–1.9)

## 2019-05-16 MED ORDER — PROMETHAZINE HCL 25 MG/ML IJ SOLN
12.5000 mg | Freq: Once | INTRAMUSCULAR | Status: AC
  Administered 2019-05-16: 12.5 mg via INTRAVENOUS
  Filled 2019-05-16: qty 1

## 2019-05-16 MED ORDER — FAMOTIDINE IN NACL 20-0.9 MG/50ML-% IV SOLN
20.0000 mg | Freq: Once | INTRAVENOUS | Status: AC
  Administered 2019-05-16: 20 mg via INTRAVENOUS
  Filled 2019-05-16: qty 50

## 2019-05-16 MED ORDER — SODIUM CHLORIDE 0.9 % IV BOLUS
1000.0000 mL | Freq: Once | INTRAVENOUS | Status: AC
  Administered 2019-05-16: 1000 mL via INTRAVENOUS

## 2019-05-16 MED ORDER — ONDANSETRON HCL 4 MG/2ML IJ SOLN
4.0000 mg | Freq: Once | INTRAMUSCULAR | Status: AC
  Administered 2019-05-16: 4 mg via INTRAVENOUS
  Filled 2019-05-16: qty 2

## 2019-05-16 MED ORDER — METOCLOPRAMIDE HCL 5 MG/ML IJ SOLN
10.0000 mg | Freq: Once | INTRAMUSCULAR | Status: AC
  Administered 2019-05-16: 17:00:00 10 mg via INTRAVENOUS
  Filled 2019-05-16: qty 2

## 2019-05-16 NOTE — ED Notes (Signed)
PT ambulatory to restroom

## 2019-05-16 NOTE — ED Notes (Signed)
Pt continues to dry heave and feel nauseous. Pt requesting more nausea medication. MD made aware.

## 2019-05-16 NOTE — ED Triage Notes (Signed)
PT to ED via EMS. C/O vomiting, SOB, Diarrhea and CP and weakness. PT able to speak in complete sentences. PT was dx with covid on Tuesday.

## 2019-05-16 NOTE — ED Provider Notes (Signed)
[facility] Emergency Department Provider Note ____________________________________________   First MD Initiated Contact with Patient 05/16/19 1630     (approximate)  I have reviewed the triage vital signs and the nursing notes.   HISTORY  Chief Complaint Emesis and Covid-19    HPI Teresa [name] is a 73 y.o. female with PMH as noted below and recent diagnosis of COVID-19 who presents with multiple symptoms.  Specifically the patient reports persistent nausea and vomiting over the last several days, and is unable to hold anything down.  She denies associated abdominal pain.  She states that she has had a fever and associated generalized weakness.  She also reports shortness of breath and cough.  Past Medical History:  Diagnosis Date  . Complication of anesthesia   . Depression   . Diabetes mellitus without complication ([place])   . Dyspnea    DOE  . Edema    FEET/LEGS  . GERD (gastroesophageal reflux disease)   . Gout   . Headache   . Heart murmur   . History of hiatal hernia   . History of orthopnea   . Hypertension   . Hypothyroidism   . PONV (postoperative nausea and vomiting)   . 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 Sleep apnea    NO CPAP  . Thyroid disease   . Vertigo     Patient Active Problem List   Diagnosis Date Noted  . Encounter for screening colonoscopy   . Benign neoplasm of cecum   . Diverticulosis of large intestine without diverticulitis   . Tortuous colon   . Polyp of sigmoid colon   . Intestinal lump   . Polyneuropathy associated with underlying disease ([place]) 12/11/2016  . Microcalcifications of the breast 12/15/2015  . Back pain, chronic 06/28/2015  . Chronic airway obstruction ([place]) 06/28/2015  . 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 Essential hypertension 06/21/2009  . 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 FEVER UNSPECIFIED 06/21/2009  . NAUSEA 06/21/2009  . DYSPHAGIA 06/21/2009  . RUQ PAIN 06/21/2009  . HIATAL  HERNIA 01/07/2008  . Hypothyroidism 11/22/2007  . ANXIETY 11/22/2007  . DEPRESSION 11/22/2007  . Hypertensive heart disease without heart failure 11/22/2007  . 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 RENAL CALCULUS 11/22/2007  . Osteoarthritis 11/22/2007  . SLEEP APNEA 11/22/2007  . HEADACHE, CHRONIC 11/22/2007  . GASTRITIS 10/23/2007  . DIVERTICULOSIS, COLON 10/23/2007  . Diabetes ([place]) 07/16/2007  . HYPERCHOLESTEROLEMIA 07/16/2007  . OBESITY, MODERATE 07/16/2007  . ISCHEMIC HEART DISEASE 07/16/2007  . ESOPHAGEAL REFLUX 07/16/2007  . SCLERODERMA 07/16/2007  . DYSPNEA 07/16/2007  . COUGH, CHRONIC 07/16/2007    Past Surgical History:  Procedure Laterality Date  . ABDOMINAL HYSTERECTOMY     ovaries intact  . BLADDER SURGERY     bladder tuck  . BREAST BIOPSY Right 03/20/2016   path pending x 2 area  . CARDIAC CATHETERIZATION    . CARPAL TUNNEL RELEASE    . CATARACT EXTRACTION W/PHACO Left 09/17/2018   Procedure: CATARACT EXTRACTION PHACO AND INTRAOCULAR LENS PLACEMENT (IOC) LEFT, DIABETIC;  Surgeon: Birder Robson, MD;  Location: [facility] ORS;  Service: Ophthalmology;  Laterality: Left;  [place] 00:34 CDE 4.85 Fluid pack lot # [id number]  . CATARACT EXTRACTION W/PHACO Right 10/15/2018   Procedure: CATARACT EXTRACTION PHACO AND INTRAOCULAR LENS PLACEMENT (IOC)-RIGHT;  Surgeon: Birder Robson, MD;  Location: [facility] ORS;  Service: Ophthalmology;  Laterality: Right;  [place] 00:27.6 CDE 3.43 Fluid Pack Lot # [id number]  . COLONOSCOPY WITH PROPOFOL N/A  09/27/2018   Procedure: COLONOSCOPY WITH PROPOFOL;  Surgeon: Virgel Manifold, MD;  Location: [facility] ENDOSCOPY;  Service: Endoscopy;  Laterality: N/A;  . DILATION AND CURETTAGE OF UTERUS    . EYE SURGERY     eyelid  . FRACTURE SURGERY     left ankle-plate and screws palced  . LITHOTRIPSY    . NISSEN FUNDOPLICATION    . SHOULDER SURGERY Right   . TONSILLECTOMY      Prior to Admission medications   Medication Sig Start Date End Date Taking?  Authorizing Provider  allopurinol (ZYLOPRIM) 100 MG tablet TAKE 1 TABLET BY MOUTH EVERY DAY 05/14/19   [name], MD  atorvastatin (LIPITOR) 10 MG tablet TAKE 1 TABLET BY MOUTH EVERY DAY AT BEDTIME Patient taking differently: Take 10 mg by mouth at bedtime.  07/05/18   [name], MD  colchicine 0.6 MG tablet Take 0.6 mg by mouth daily as needed (for gout flare-ups).     [provider]  Continuous Blood Gluc Sensor (FREESTYLE LIBRE 14 DAY SENSOR) MISC USE EVERY 14 DAYS AS DIRECTED 11/22/18   [name], MD  doxycycline (VIBRA-TABS) 100 MG tablet Take 100 mg by mouth 2 (two) times daily. 09/03/18   [provider]  furosemide (LASIX) 40 MG tablet TAKE 1 TABLET BY MOUTH EVERY DAY 05/14/19   [name], MD  gabapentin (NEURONTIN) 400 MG capsule Take 1 capsule (400 mg total) by mouth 3 (three) times daily. 04/12/16   [name], MD  glimepiride (AMARYL) 4 MG tablet TAKE 1 TABLET (4 MG TOTAL) BY MOUTH 2 (TWO) TIMES DAILY. 06/13/18   [name], MD  glucose blood test strip Check blood sugar daily. Use as instructed 09/26/16   [name], MD  JARDIANCE 25 MG TABS tablet TAKE 25 MG BY MOUTH DAILY. 03/20/18   [name], MD  levothyroxine (SYNTHROID, LEVOTHROID) 88 MCG tablet TAKE 1 TABLET BY MOUTH EVERY DAY Patient taking differently: Take 88 mcg by mouth daily before breakfast.  03/20/18   [name], MD  losartan (COZAAR) 25 MG tablet Take 1 tablet (25 mg total) by mouth daily. 09/05/18   [name], MD  metFORMIN (GLUCOPHAGE) 1000 MG tablet TAKE 1 TABLET BY MOUTH TWICE A DAY 12/28/18   [name], MD  nystatin cream (MYCOSTATIN) Apply 1 application topically 2 (two) times daily. Patient taking differently: Apply 1 application topically 2 (two) times daily as needed (for diabetic yeast reactions.).  10/04/17   [name], MD  omeprazole (PRILOSEC) 20 MG  capsule Take 20 mg by mouth daily as needed (for acid reflux/indigestion.).  10/05/14   [provider]  ondansetron (ZOFRAN) 4 MG tablet Take 1 tablet (4 mg total) by mouth every 8 (eight) hours as needed for nausea or vomiting. 05/14/19   [name], MD  ondansetron (ZOFRAN-ODT) 4 MG disintegrating tablet Take 4 mg by mouth every 8 (eight) hours as needed for nausea or vomiting.    [provider]  ONE TOUCH LANCETS MISC 1 each by Does not apply route daily. 08/13/15   [name], MD  pioglitazone (ACTOS) 30 MG tablet TAKE 1 TABLET BY MOUTH EVERY DAY Patient taking differently: Take 30 mg by mouth daily.  09/04/18   [name], MD  Semaglutide,0.25 or 0.5MG /DOS, (OZEMPIC, 0.25 OR 0.5 MG/DOSE,) 2 MG/1.5ML SOPN Inject 0.5  Syringes into the skin once a week. 10/07/18   [name], MD    Allergies Bacitracin-neomycin-polymyxin, Clarithromycin, Codeine, Dilaudid  [hydromorphone hcl], Iodine, Iohexol, Neomycin-bacitracin zn-polymyx, Tamiflu  [oseltamivir phosphate], Zolpidem, Benzalkonium chloride, Lidocaine hcl, Morphine, Tape, and Tapentadol  Family History  Problem Relation Age of Onset  . Stroke Mother   . Hypertension Mother   . Heart disease Mother   . Arthritis Mother   . Heart disease Father   . Hypertension Father   . Diabetes Sister   . Hypertension Sister   . Asthma Sister   . Hypertension Sister   . Diverticulitis Sister   . Colon cancer Maternal Grandmother   . Breast cancer Maternal Grandmother   . Breast cancer Maternal Aunt     Social History Social History   Tobacco Use  . Smoking status: Never Smoker  . Smokeless tobacco: Never Used  Substance Use Topics  . Alcohol use: No    Alcohol/week: 0.0 standard drinks  . Drug use: Never    Review of Systems  Constitutional: Positive for fever. Eyes: No redness. ENT: No sore throat. Cardiovascular: Denies chest pain. Respiratory: Positive for shortness of  breath. Gastrointestinal: Positive for vomiting and diarrhea.  Genitourinary: Negative for dysuria.  Musculoskeletal: Negative for back pain. Skin: Negative for rash. Neurological: Negative for headache.   ____________________________________________   PHYSICAL EXAM:  VITAL SIGNS: ED Triage Vitals  Enc Vitals Group     BP 05/16/19 1631 (!) 147/77     Pulse Rate 05/16/19 1627 78     Resp 05/16/19 1627 14     Temp 05/16/19 1627 100 F (37.8 C)     Temp Source 05/16/19 1627 Oral     SpO2 05/16/19 1625 96 %     Weight 05/16/19 1628 190 lb (86.2 kg)     Height 05/16/19 1628 5\' 2"  (1.575 m)     Head Circumference --      Peak Flow --      Pain Score 05/16/19 1628 10     Pain Loc --      Pain Edu? --      Excl. in [place]? --     Constitutional: Alert and oriented.  Weak appearing but in no acute distress. Eyes: Conjunctivae are normal.  Head: Atraumatic. Nose: No congestion/rhinnorhea. Mouth/Throat: Mucous membranes are dry.   Neck: Normal range of motion.  Cardiovascular: Normal rate, regular rhythm. Good peripheral circulation. Respiratory: Normal respiratory effort.  No retractions. Gastrointestinal: No distention.  Musculoskeletal: No lower extremity edema.  Extremities warm and well perfused.  Neurologic:  Normal speech and language. No gross focal neurologic deficits are appreciated.  Skin:  Skin is warm and dry. No rash noted. Psychiatric: Mood and affect are normal. Speech and behavior are normal.  ____________________________________________   LABS (all labs ordered are listed, but only abnormal results are displayed)  Labs Reviewed  BASIC METABOLIC PANEL - Abnormal; Notable for the following components:      Result Value   CO2 21 (*)    Glucose, Bld 201 (*)    Calcium 8.4 (*)    Anion gap 17 (*)    All other components within normal limits  HEPATIC FUNCTION PANEL - Abnormal; Notable for the following components:   Albumin 3.4 (*)    Bilirubin, Direct 0.3  (*)    All other components within normal limits  LACTIC ACID, PLASMA - Abnormal; Notable for the following components:   Lactic Acid, Venous 2.0 (*)  All other components within normal limits  CBC WITH DIFFERENTIAL/PLATELET  LACTIC ACID, PLASMA   ____________________________________________  EKG  ED ECG REPORT I, Arta Silence, the attending physician, personally viewed and interpreted this ECG.  Date: 05/16/2019 EKG Time: 1627 Rate: 82 Rhythm: normal sinus rhythm with PVCs QRS Axis: normal Intervals: RBBB ST/T Wave abnormalities: normal Narrative Interpretation: no evidence of acute ischemia   ____________________________________________  RADIOLOGY  CXR: Bilateral hazy opacities consistent with viral pneumonia  ____________________________________________   PROCEDURES  Procedure(s) performed: No  Procedures  Critical Care performed: No ____________________________________________   INITIAL IMPRESSION / ASSESSMENT AND PLAN / ED COURSE  Pertinent labs & imaging results that were available during my care of the patient were reviewed by me and considered in my medical decision making (see chart for details).  73 year old female with PMH as noted above and diagnosis of COVID-19 presents with worsening symptoms over the last several days including persistent nausea and vomiting and inability to tolerate p.o., shortness of breath, and malaise.  I reviewed the past medical records in Epic and confirmed that the patient had a positive coronavirus test on 05/12/2019.  On exam the patient is somewhat weak but relatively comfortable appearing.  O2 saturation is in the mid 90s on room air and the patient has no respiratory distress at this time.  Her other vital signs are normal except for low-grade temperature.  The remainder of the physical exam is as described above.  Overall presentation is consistent with symptoms related to COVID-19.  Although the patient's  respiratory status is adequate at this time, I am concerned for dehydration given that she has had persistent vomiting and diarrhea and has been unable to tolerate p.o.  We will obtain chest x-ray, lab work-up, give fluids, antiemetic, and reassess.  ----------------------------------------- 8:35 PM on 05/16/2019 -----------------------------------------  The lab work-up is reassuring except for slightly elevated lactate and anion gap.  The chest x-ray shows findings compatible with COVID-19.  The vital signs remain stable.  However, the patient continues to have relatively significant nausea and recurrent vomiting.  She is unable to tolerate any p.o.  Therefore, I will admit her.  I discussed the case with Dr. Myna Hidalgo at the [facility] who accepts the patient for transfer.  The patient is stable for transfer at this time.  ______________________________  Teresa [name] was evaluated in Emergency Department on 05/16/2019 for the symptoms described in the history of present illness. She was evaluated in the context of the global COVID-19 pandemic, which necessitated consideration that the patient might be at risk for infection with the SARS-CoV-2 virus that causes COVID-19. Institutional protocols and algorithms that pertain to the evaluation of patients at risk for COVID-19 are in a state of rapid change based on information released by regulatory bodies including the CDC and federal and state organizations. These policies and algorithms were followed during the patient's care in the ED.  ____________________________________________   FINAL CLINICAL IMPRESSION(S) / ED DIAGNOSES  Final diagnoses:  COVID-19  Intractable vomiting with nausea, unspecified vomiting type      NEW MEDICATIONS STARTED DURING THIS VISIT:  New Prescriptions   No medications on file     Note:  This document was prepared using Dragon voice recognition software and may include unintentional dictation errors.     Arta Silence, MD 05/16/19 2036

## 2019-05-17 DIAGNOSIS — I2510 Atherosclerotic heart disease of native coronary artery without angina pectoris: Secondary | ICD-10-CM

## 2019-05-17 DIAGNOSIS — E039 Hypothyroidism, unspecified: Secondary | ICD-10-CM

## 2019-05-17 DIAGNOSIS — I5032 Chronic diastolic (congestive) heart failure: Secondary | ICD-10-CM

## 2019-05-17 DIAGNOSIS — U071 COVID-19: Principal | ICD-10-CM

## 2019-05-17 DIAGNOSIS — E119 Type 2 diabetes mellitus without complications: Secondary | ICD-10-CM

## 2019-05-17 DIAGNOSIS — J1289 Other viral pneumonia: Secondary | ICD-10-CM

## 2019-05-17 LAB — C-REACTIVE PROTEIN: CRP: 7.1 mg/dL — ABNORMAL HIGH (ref <1.0)

## 2019-05-17 LAB — COMPREHENSIVE METABOLIC PANEL
ALT: 22 U/L (ref 0–44)
AST: 26 U/L (ref 15–41)
Albumin: 3.5 g/dL (ref 3.5–5.0)
Alkaline Phosphatase: 45 U/L (ref 38–126)
Anion gap: 14 (ref 5–15)
BUN: 9 mg/dL (ref 8–23)
CO2: 21 mmol/L — ABNORMAL LOW (ref 22–32)
Calcium: 8 mg/dL — ABNORMAL LOW (ref 8.9–10.3)
Chloride: 103 mmol/L (ref 98–111)
Creatinine, Ser: 0.61 mg/dL (ref 0.44–1.00)
GFR calc Af Amer: >60 mL/min (ref >60)
GFR calc non Af Amer: >60 mL/min (ref >60)
Glucose, Bld: 244 mg/dL — ABNORMAL HIGH (ref 70–99)
Potassium: 3 mmol/L — ABNORMAL LOW (ref 3.5–5.1)
Sodium: 138 mmol/L (ref 135–145)
Total Bilirubin: 0.6 mg/dL (ref 0.3–1.2)
Total Protein: 6.8 g/dL (ref 6.5–8.1)

## 2019-05-17 LAB — CBC WITH DIFFERENTIAL/PLATELET
Abs Immature Granulocytes: 0.03 10*3/uL (ref 0.00–0.07)
Basophils Absolute: 0 10*3/uL (ref 0.0–0.1)
Basophils Relative: 0 %
Eosinophils Absolute: 0 10*3/uL (ref 0.0–0.5)
Eosinophils Relative: 0 %
HCT: 38.8 % (ref 36.0–46.0)
Hemoglobin: 12.9 g/dL (ref 12.0–15.0)
Immature Granulocytes: 0 %
Lymphocytes Relative: 10 %
Lymphs Abs: 0.9 10*3/uL (ref 0.7–4.0)
MCH: 30 pg (ref 26.0–34.0)
MCHC: 33.2 g/dL (ref 30.0–36.0)
MCV: 90.2 fL (ref 80.0–100.0)
Monocytes Absolute: 0.2 10*3/uL (ref 0.1–1.0)
Monocytes Relative: 3 %
Neutro Abs: 7.5 10*3/uL (ref 1.7–7.7)
Neutrophils Relative %: 87 %
Platelets: 207 10*3/uL (ref 150–400)
RBC: 4.3 MIL/uL (ref 3.87–5.11)
RDW: 14.3 % (ref 11.5–15.5)
WBC: 8.7 10*3/uL (ref 4.0–10.5)
nRBC: 0 % (ref 0.0–0.2)

## 2019-05-17 LAB — ABO/RH: ABO/RH(D): O POS

## 2019-05-17 LAB — GLUCOSE, CAPILLARY
Glucose-Capillary: 207 mg/dL — ABNORMAL HIGH (ref 70–99)
Glucose-Capillary: 287 mg/dL — ABNORMAL HIGH (ref 70–99)
Glucose-Capillary: 288 mg/dL — ABNORMAL HIGH (ref 70–99)
Glucose-Capillary: 345 mg/dL — ABNORMAL HIGH (ref 70–99)
Glucose-Capillary: 353 mg/dL — ABNORMAL HIGH (ref 70–99)

## 2019-05-17 LAB — D-DIMER, QUANTITATIVE: D-Dimer, Quant: 2.42 ug/mL-FEU — ABNORMAL HIGH (ref 0.00–0.50)

## 2019-05-17 LAB — FERRITIN: Ferritin: 259 ng/mL (ref 11–307)

## 2019-05-17 LAB — HEMOGLOBIN A1C
Hgb A1c MFr Bld: 8.4 % — ABNORMAL HIGH (ref 4.8–5.6)
Mean Plasma Glucose: 194.38 mg/dL

## 2019-05-17 LAB — PROCALCITONIN: Procalcitonin: <0.1 ng/mL

## 2019-05-17 LAB — MAGNESIUM
Magnesium: 1 mg/dL — ABNORMAL LOW (ref 1.7–2.4)
Magnesium: 2.3 mg/dL (ref 1.7–2.4)

## 2019-05-17 LAB — PHOSPHORUS: Phosphorus: 1.5 mg/dL — ABNORMAL LOW (ref 2.5–4.6)

## 2019-05-17 MED ORDER — GABAPENTIN 300 MG PO CAPS
400.0000 mg | ORAL_CAPSULE | Freq: Three times a day (TID) | ORAL | Status: DC
  Administered 2019-05-17 – 2019-05-27 (×31): 400 mg via ORAL
  Filled 2019-05-17 (×32): qty 1

## 2019-05-17 MED ORDER — SODIUM CHLORIDE 0.9% FLUSH
3.0000 mL | Freq: Two times a day (BID) | INTRAVENOUS | Status: DC
  Administered 2019-05-17 – 2019-05-27 (×22): 3 mL via INTRAVENOUS

## 2019-05-17 MED ORDER — INSULIN ASPART 100 UNIT/ML ~~LOC~~ SOLN
0.0000 [IU] | Freq: Every day | SUBCUTANEOUS | Status: DC
  Administered 2019-05-17: 2 [IU] via SUBCUTANEOUS

## 2019-05-17 MED ORDER — PROMETHAZINE HCL 25 MG/ML IJ SOLN
12.5000 mg | Freq: Four times a day (QID) | INTRAMUSCULAR | Status: DC | PRN
  Administered 2019-05-17: 12.5 mg via INTRAVENOUS
  Filled 2019-05-17: qty 1

## 2019-05-17 MED ORDER — INSULIN ASPART 100 UNIT/ML ~~LOC~~ SOLN
0.0000 [IU] | Freq: Three times a day (TID) | SUBCUTANEOUS | Status: DC
  Administered 2019-05-17: 11 [IU] via SUBCUTANEOUS
  Administered 2019-05-17: 15 [IU] via SUBCUTANEOUS
  Administered 2019-05-17: 08:00:00 8 [IU] via SUBCUTANEOUS

## 2019-05-17 MED ORDER — DEXAMETHASONE SODIUM PHOSPHATE 10 MG/ML IJ SOLN
6.0000 mg | INTRAMUSCULAR | Status: DC
  Administered 2019-05-18 – 2019-05-25 (×8): 6 mg via INTRAVENOUS
  Filled 2019-05-17 (×8): qty 1

## 2019-05-17 MED ORDER — ONDANSETRON HCL 4 MG/2ML IJ SOLN: 4.0000 mg | Freq: Four times a day (QID) | INTRAMUSCULAR | Status: DC | PRN

## 2019-05-17 MED ORDER — SODIUM CHLORIDE 0.9 % IV SOLN
100.0000 mg | INTRAVENOUS | Status: AC
  Administered 2019-05-18 – 2019-05-21 (×4): 100 mg via INTRAVENOUS
  Filled 2019-05-17 (×4): qty 20

## 2019-05-17 MED ORDER — ONDANSETRON HCL 4 MG PO TABS: 4.0000 mg | ORAL_TABLET | Freq: Four times a day (QID) | ORAL | Status: DC | PRN

## 2019-05-17 MED ORDER — SODIUM CHLORIDE 0.9 % IV SOLN
200.0000 mg | Freq: Once | INTRAVENOUS | Status: AC
  Administered 2019-05-17: 22:00:00 200 mg via INTRAVENOUS
  Filled 2019-05-17: qty 40

## 2019-05-17 MED ORDER — GUAIFENESIN-DM 100-10 MG/5ML PO SYRP
5.0000 mL | ORAL_SOLUTION | ORAL | Status: DC | PRN
  Administered 2019-05-17 – 2019-05-27 (×18): 5 mL via ORAL
  Filled 2019-05-17 (×19): qty 10

## 2019-05-17 MED ORDER — FAMOTIDINE IN NACL 20-0.9 MG/50ML-% IV SOLN
20.0000 mg | Freq: Two times a day (BID) | INTRAVENOUS | Status: DC
  Administered 2019-05-17: 20 mg via INTRAVENOUS
  Filled 2019-05-17: qty 50

## 2019-05-17 MED ORDER — BENZONATATE 100 MG PO CAPS
100.0000 mg | ORAL_CAPSULE | Freq: Three times a day (TID) | ORAL | Status: DC
  Administered 2019-05-17 – 2019-05-22 (×17): 100 mg via ORAL
  Filled 2019-05-17 (×18): qty 1

## 2019-05-17 MED ORDER — SODIUM CHLORIDE 0.9% FLUSH
3.0000 mL | Freq: Two times a day (BID) | INTRAVENOUS | Status: DC
  Administered 2019-05-17 (×2): 3 mL via INTRAVENOUS

## 2019-05-17 MED ORDER — FAMOTIDINE 20 MG PO TABS
20.0000 mg | ORAL_TABLET | Freq: Two times a day (BID) | ORAL | Status: DC
  Administered 2019-05-17 – 2019-05-27 (×20): 20 mg via ORAL
  Filled 2019-05-17 (×20): qty 1

## 2019-05-17 MED ORDER — LEVOTHYROXINE SODIUM 88 MCG PO TABS
88.0000 ug | ORAL_TABLET | Freq: Every day | ORAL | Status: DC
  Administered 2019-05-17 – 2019-05-27 (×11): 88 ug via ORAL
  Filled 2019-05-17 (×13): qty 1

## 2019-05-17 MED ORDER — FENTANYL CITRATE (PF) 100 MCG/2ML IJ SOLN
25.0000 ug | INTRAMUSCULAR | Status: DC | PRN
  Administered 2019-05-18 – 2019-05-23 (×13): 25 ug via INTRAVENOUS
  Administered 2019-05-24: 02:00:00 50 ug via INTRAVENOUS
  Filled 2019-05-17 (×14): qty 2

## 2019-05-17 MED ORDER — ENOXAPARIN SODIUM 40 MG/0.4ML ~~LOC~~ SOLN
40.0000 mg | Freq: Every day | SUBCUTANEOUS | Status: DC
  Administered 2019-05-17 – 2019-05-26 (×11): 40 mg via SUBCUTANEOUS
  Filled 2019-05-17 (×11): qty 0.4

## 2019-05-17 MED ORDER — INSULIN ASPART 100 UNIT/ML ~~LOC~~ SOLN
0.0000 [IU] | Freq: Three times a day (TID) | SUBCUTANEOUS | Status: DC
  Administered 2019-05-18: 08:00:00 11 [IU] via SUBCUTANEOUS
  Administered 2019-05-18 – 2019-05-19 (×2): 20 [IU] via SUBCUTANEOUS
  Administered 2019-05-19: 11 [IU] via SUBCUTANEOUS
  Administered 2019-05-19 – 2019-05-20 (×3): 15 [IU] via SUBCUTANEOUS
  Administered 2019-05-20 – 2019-05-21 (×2): 20 [IU] via SUBCUTANEOUS
  Administered 2019-05-21: 7 [IU] via SUBCUTANEOUS
  Administered 2019-05-21: 17:00:00 20 [IU] via SUBCUTANEOUS
  Administered 2019-05-22: 15 [IU] via SUBCUTANEOUS
  Administered 2019-05-22: 7 [IU] via SUBCUTANEOUS
  Administered 2019-05-23: 17:00:00 20 [IU] via SUBCUTANEOUS
  Administered 2019-05-23: 7 [IU] via SUBCUTANEOUS
  Administered 2019-05-23: 20 [IU] via SUBCUTANEOUS
  Administered 2019-05-24: 11 [IU] via SUBCUTANEOUS
  Administered 2019-05-24: 20 [IU] via SUBCUTANEOUS
  Administered 2019-05-24: 4 [IU] via SUBCUTANEOUS
  Administered 2019-05-25: 3 [IU] via SUBCUTANEOUS
  Administered 2019-05-25: 4 [IU] via SUBCUTANEOUS
  Administered 2019-05-25: 20 [IU] via SUBCUTANEOUS
  Administered 2019-05-26: 14:00:00 15 [IU] via SUBCUTANEOUS
  Administered 2019-05-27: 4 [IU] via SUBCUTANEOUS

## 2019-05-17 MED ORDER — MAGNESIUM SULFATE 4 GM/100ML IV SOLN
4.0000 g | Freq: Once | INTRAVENOUS | Status: AC
  Administered 2019-05-17: 4 g via INTRAVENOUS
  Filled 2019-05-17: qty 100

## 2019-05-17 MED ORDER — METOCLOPRAMIDE HCL 5 MG/ML IJ SOLN: 5.0000 mg | Freq: Four times a day (QID) | INTRAMUSCULAR | Status: DC | PRN

## 2019-05-17 MED ORDER — INSULIN ASPART 100 UNIT/ML ~~LOC~~ SOLN
0.0000 [IU] | Freq: Every day | SUBCUTANEOUS | Status: DC
  Administered 2019-05-17: 3 [IU] via SUBCUTANEOUS
  Administered 2019-05-18: 5 [IU] via SUBCUTANEOUS
  Administered 2019-05-19: 2 [IU] via SUBCUTANEOUS
  Administered 2019-05-20 – 2019-05-22 (×3): 5 [IU] via SUBCUTANEOUS
  Administered 2019-05-23: 2 [IU] via SUBCUTANEOUS
  Administered 2019-05-25: 3 [IU] via SUBCUTANEOUS

## 2019-05-17 MED ORDER — POTASSIUM CHLORIDE 10 MEQ/100ML IV SOLN
10.0000 meq | INTRAVENOUS | Status: AC
  Administered 2019-05-17 (×3): 10 meq via INTRAVENOUS
  Filled 2019-05-17 (×4): qty 100

## 2019-05-17 MED ORDER — LINAGLIPTIN 5 MG PO TABS
5.0000 mg | ORAL_TABLET | Freq: Every day | ORAL | Status: DC
  Administered 2019-05-17 – 2019-05-27 (×11): 5 mg via ORAL
  Filled 2019-05-17 (×12): qty 1

## 2019-05-17 MED ORDER — LOSARTAN POTASSIUM 25 MG PO TABS
25.0000 mg | ORAL_TABLET | Freq: Every day | ORAL | Status: DC
  Administered 2019-05-17 – 2019-05-27 (×11): 25 mg via ORAL
  Filled 2019-05-17 (×11): qty 1

## 2019-05-17 MED ORDER — DEXAMETHASONE SODIUM PHOSPHATE 10 MG/ML IJ SOLN
6.0000 mg | Freq: Every day | INTRAMUSCULAR | Status: DC
  Administered 2019-05-17: 6 mg via INTRAVENOUS
  Filled 2019-05-17: qty 1

## 2019-05-17 MED ORDER — ALLOPURINOL 100 MG PO TABS
100.0000 mg | ORAL_TABLET | Freq: Every day | ORAL | Status: DC
  Administered 2019-05-17 – 2019-05-26 (×10): 100 mg via ORAL
  Filled 2019-05-17 (×12): qty 1

## 2019-05-17 MED ORDER — SODIUM CHLORIDE 0.9 % IV SOLN: 250.0000 mL | INTRAVENOUS | Status: DC | PRN

## 2019-05-17 MED ORDER — INSULIN GLARGINE 100 UNIT/ML ~~LOC~~ SOLN
14.0000 [IU] | Freq: Every day | SUBCUTANEOUS | Status: DC
  Administered 2019-05-17: 14 [IU] via SUBCUTANEOUS
  Filled 2019-05-17: qty 0.14

## 2019-05-17 MED ORDER — ACETAMINOPHEN 325 MG PO TABS
650.0000 mg | ORAL_TABLET | Freq: Four times a day (QID) | ORAL | Status: DC | PRN
  Administered 2019-05-17 – 2019-05-27 (×15): 650 mg via ORAL
  Filled 2019-05-17 (×15): qty 2

## 2019-05-17 MED ORDER — ATORVASTATIN CALCIUM 10 MG PO TABS
10.0000 mg | ORAL_TABLET | Freq: Every day | ORAL | Status: DC
  Administered 2019-05-17 – 2019-05-26 (×10): 10 mg via ORAL
  Filled 2019-05-17 (×10): qty 1

## 2019-05-17 MED ORDER — SODIUM CHLORIDE 0.9% FLUSH: 3.0000 mL | INTRAVENOUS | Status: DC | PRN

## 2019-05-17 NOTE — Plan of Care (Signed)

## 2019-05-17 NOTE — Progress Notes (Signed)
Pharmacy Note - Remdesivir Dosing  O:  ALT: 22 CXR: Patchy bilateral airspace disease is worse on the left Requiring supplemental O2: 1L   A/P:  Patient meets criteria for remdesivir.  Begin remdesivir 200 mg IV x 1, followed by 100 mg IV daily x 4 days  Monitor ALT, clinical progress  Peggyann Juba, PharmD, [place] [phone] 05/17/2019 7:46 PM

## 2019-05-17 NOTE — Progress Notes (Signed)
Pt arrived from [facility] A&Ox[facility]; Able to communicate wants and needs. Independent w/some weakness. N/V/D; medication given for nausea/vomiting. Will continue to monitor.

## 2019-05-17 NOTE — H&P (Signed)
History and Physical    Teresa [name] [id number] DOB: 17-Jun-1946 DOA: 05/16/2019  PCP: [name], MD   Patient coming from: Home   Chief Complaint: N/V/D, fever, cough, SOB, malaise   HPI: Teresa [name] is a 73 y.o. female with medical history significant for hypertension, diabetes, CHF with preserved EF in 2016, and coronary artery disease, now presenting to the emergency department for evaluation of nausea, vomiting, diarrhea, general malaise, fevers, cough, and shortness of breath.  She had been diagnosed with COVID-19 on 05/12/2019.  Patient reports that her symptoms began 1 week ago with fevers and general malaise.  She has since gone on to develop cough, shortness of breath, generalized abdominal discomfort, nausea, and nonbloody vomiting.  She has been unable to take her medications or eat or drink much of anything for the past couple days due to her nausea and vomiting.    [facility] ED Course: Upon arrival to the ED, patient is found to have a temperature of 37.8 C, saturating low 90s on room air while at rest, tachypneic up to 28, and with stable blood pressure.  EKG features a sinus rhythm with PVCs, RBBB, and QTc interval 496 ms.  Chest x-ray is notable for patchy bilateral airspace disease.  Chemistry panel with glucose 201.  CBC unremarkable.  Lactic acid 2.0.  Patient was given a liter of normal saline, Reglan, Zofran, Phenergan, and Pepcid in the ED.  Lactic acid normalized to 1.4.  Patient continues to have nausea and vomiting despite these aggressive measures in the emergency department, is dyspneic at rest due to pneumonia from COVID-19, and will be admitted for ongoing evaluation and management of this.  Review of Systems:  All other systems reviewed and apart from HPI, are negative.  Past Medical History:  Diagnosis Date  . Complication of anesthesia   . Depression   . Diabetes mellitus without complication ([place])   . Dyspnea    DOE   . Edema    FEET/LEGS  . GERD (gastroesophageal reflux disease)   . Gout   . Headache   . Heart murmur   . History of hiatal hernia   . History of orthopnea   . Hypertension   . Hypothyroidism   . PONV (postoperative nausea and vomiting)   . 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 Sleep apnea    NO CPAP  . Thyroid disease   . Vertigo     Past Surgical History:  Procedure Laterality Date  . ABDOMINAL HYSTERECTOMY     ovaries intact  . BLADDER SURGERY     bladder tuck  . BREAST BIOPSY Right 03/20/2016   path pending x 2 area  . CARDIAC CATHETERIZATION    . CARPAL TUNNEL RELEASE    . CATARACT EXTRACTION W/PHACO Left 09/17/2018   Procedure: CATARACT EXTRACTION PHACO AND INTRAOCULAR LENS PLACEMENT (IOC) LEFT, DIABETIC;  Surgeon: Birder Robson, MD;  Location: [facility] ORS;  Service: Ophthalmology;  Laterality: Left;  [place] 00:34 CDE 4.85 Fluid pack lot # [id number]  . CATARACT EXTRACTION W/PHACO Right 10/15/2018   Procedure: CATARACT EXTRACTION PHACO AND INTRAOCULAR LENS PLACEMENT (IOC)-RIGHT;  Surgeon: Birder Robson, MD;  Location: [facility] ORS;  Service: Ophthalmology;  Laterality: Right;  [place] 00:27.6 CDE 3.43 Fluid Pack Lot # [id number]  . COLONOSCOPY WITH PROPOFOL N/A 09/27/2018   Procedure: COLONOSCOPY WITH PROPOFOL;  Surgeon: Virgel Manifold, MD;  Location: [facility] ENDOSCOPY;  Service: Endoscopy;  Laterality: N/A;  . DILATION AND CURETTAGE  OF UTERUS    . EYE SURGERY     eyelid  . FRACTURE SURGERY     left ankle-plate and screws palced  . LITHOTRIPSY    . NISSEN FUNDOPLICATION    . SHOULDER SURGERY Right   . TONSILLECTOMY       reports that she has never smoked. She has never used smokeless tobacco. She reports that she does not drink alcohol or use drugs.  Allergies  Allergen Reactions  . Bacitracin-Neomycin-Polymyxin   . Clarithromycin Other (See Comments), Nausea Only and Nausea And Vomiting  . Codeine     Other reaction(s): Hallucination  . Dilaudid  [Hydromorphone Hcl]  Nausea And Vomiting  . Iodine   . Iohexol      Desc: PT HAS HIVES/ITCHING   . Neomycin-Bacitracin Zn-Polymyx   . Tamiflu  [Oseltamivir Phosphate]     Other reaction(s): Abdominal Pain, Vomiting  . Zolpidem Nausea And Vomiting and Other (See Comments)    Hallucinations  . Benzalkonium Chloride Itching, Rash and Swelling  . Lidocaine Hcl Itching, Rash and Swelling  . Morphine Nausea Only, Rash and Nausea And Vomiting  . Tape Itching and Rash  . Tapentadol Rash    Family History  Problem Relation Age of Onset  . Stroke Mother   . Hypertension Mother   . Heart disease Mother   . Arthritis Mother   . Heart disease Father   . Hypertension Father   . Diabetes Sister   . Hypertension Sister   . Asthma Sister   . Hypertension Sister   . Diverticulitis Sister   . Colon cancer Maternal Grandmother   . Breast cancer Maternal Grandmother   . Breast cancer Maternal Aunt      Prior to Admission medications   Medication Sig Start Date End Date Taking? Authorizing Provider  allopurinol (ZYLOPRIM) 100 MG tablet TAKE 1 TABLET BY MOUTH EVERY DAY 05/14/19   [name], MD  atorvastatin (LIPITOR) 10 MG tablet TAKE 1 TABLET BY MOUTH EVERY DAY AT BEDTIME Patient taking differently: Take 10 mg by mouth at bedtime.  07/05/18   [name], MD  colchicine 0.6 MG tablet Take 0.6 mg by mouth daily as needed (for gout flare-ups).     [provider]  Continuous Blood Gluc Sensor (FREESTYLE LIBRE 14 DAY SENSOR) MISC USE EVERY 14 DAYS AS DIRECTED 11/22/18   [name], MD  furosemide (LASIX) 40 MG tablet TAKE 1 TABLET BY MOUTH EVERY DAY 05/14/19   [name], MD  gabapentin (NEURONTIN) 400 MG capsule Take 1 capsule (400 mg total) by mouth 3 (three) times daily. 04/12/16   [name], MD  glimepiride (AMARYL) 4 MG tablet TAKE 1 TABLET (4 MG TOTAL) BY MOUTH 2 (TWO) TIMES DAILY. 06/13/18   [name], MD  glucose blood test strip  Check blood sugar daily. Use as instructed 09/26/16   [name], MD  JARDIANCE 25 MG TABS tablet TAKE 25 MG BY MOUTH DAILY. 03/20/18   [name], MD  levothyroxine (SYNTHROID, LEVOTHROID) 88 MCG tablet TAKE 1 TABLET BY MOUTH EVERY DAY Patient taking differently: Take 88 mcg by mouth daily before breakfast.  03/20/18   [name], MD  losartan (COZAAR) 25 MG tablet Take 1 tablet (25 mg total) by mouth daily. 09/05/18   [name], MD  metFORMIN (GLUCOPHAGE) 1000 MG tablet TAKE 1 TABLET BY MOUTH TWICE A DAY 12/28/18  [name], MD  nystatin cream (MYCOSTATIN) Apply 1 application topically 2 (two) times daily. Patient taking differently: Apply 1 application topically 2 (two) times daily as needed (for diabetic yeast reactions.).  10/04/17   [name], MD  omeprazole (PRILOSEC) 20 MG capsule Take 20 mg by mouth daily as needed (for acid reflux/indigestion.).  10/05/14   [provider]  ondansetron (ZOFRAN) 4 MG tablet Take 1 tablet (4 mg total) by mouth every 8 (eight) hours as needed for nausea or vomiting. 05/14/19   [name], MD  ondansetron (ZOFRAN-ODT) 4 MG disintegrating tablet Take 4 mg by mouth every 8 (eight) hours as needed for nausea or vomiting.    [provider]  ONE TOUCH LANCETS MISC 1 each by Does not apply route daily. 08/13/15   [name], MD  pioglitazone (ACTOS) 30 MG tablet TAKE 1 TABLET BY MOUTH EVERY DAY Patient taking differently: Take 30 mg by mouth daily.  09/04/18   [name], MD  Semaglutide,0.25 or 0.5MG /DOS, (OZEMPIC, 0.25 OR 0.5 MG/DOSE,) 2 MG/1.5ML SOPN Inject 0.5 Syringes into the skin once a week. 10/07/18   [name], MD    Physical Exam: There were no vitals filed for this visit.  Constitutional: NAD, calm  Eyes: PERTLA, lids and conjunctivae normal ENMT: Mucous membranes are moist. Posterior pharynx clear of any exudate or  lesions.   Neck: normal, supple, no masses, no thyromegaly Respiratory: no wheezing, scattered rhonchi. No accessory muscle use.  Cardiovascular: S1 & S2 heard, regular rate and rhythm. No extremity edema.   Abdomen: No distension, soft, no rebound pain or guarding. Bowel sounds active.  Musculoskeletal: no clubbing / cyanosis. No joint deformity upper and lower extremities.    Skin: no significant rashes, lesions, ulcers. Warm, dry, well-perfused. Neurologic: CN 2-12 grossly intact. Sensation intact. Strength 5/5 in all 4 limbs.  Psychiatric: Alert and oriented x 3. Pleasant, cooperative.     Labs on Admission: I have personally reviewed following labs and imaging studies  CBC: Recent Labs  Lab 05/16/19 1633  WBC 7.3  NEUTROABS 5.3  HGB 14.0  HCT 41.0  MCV 87.4  PLT 123456   Basic Metabolic Panel: Recent Labs  Lab 05/16/19 1633  NA 137  K 3.6  CL 99  CO2 21*  GLUCOSE 201*  BUN 10  CREATININE 0.57  CALCIUM 8.4*   GFR: Estimated Creatinine Clearance: 63.8 mL/min (by C-G formula based on SCr of 0.57 mg/dL). Liver Function Tests: Recent Labs  Lab 05/16/19 1633  AST 36  ALT 24  ALKPHOS 52  BILITOT 1.2  PROT 7.1  ALBUMIN 3.4*   No results for input(s): LIPASE, AMYLASE in the last 168 hours. No results for input(s): AMMONIA in the last 168 hours. Coagulation Profile: No results for input(s): INR, PROTIME in the last 168 hours. Cardiac Enzymes: No results for input(s): CKTOTAL, CKMB, CKMBINDEX, TROPONINI in the last 168 hours. BNP (last 3 results) No results for input(s): PROBNP in the last 8760 hours. HbA1C: No results for input(s): HGBA1C in the last 72 hours. CBG: No results for input(s): GLUCAP in the last 168 hours. Lipid Profile: No results for input(s): CHOL, HDL, LDLCALC, TRIG, CHOLHDL, LDLDIRECT in the last 72 hours. Thyroid Function Tests: No results for input(s): TSH, T4TOTAL, FREET4, T3FREE, THYROIDAB in the last 72 hours. Anemia Panel: No results  for input(s): VITAMINB12, FOLATE, FERRITIN, TIBC, IRON, RETICCTPCT in the last 72 hours. Urine analysis:  Component Value Date/Time   COLORURINE Yellow 05/14/2014 0956   APPEARANCEUR Hazy 05/14/2014 0956   LABSPEC 1.028 05/14/2014 0956   PHURINE 5.0 05/14/2014 0956   GLUCOSEU Negative 05/14/2014 0956   HGBUR Negative 05/14/2014 0956   BILIRUBINUR Negative 12/08/2015 0959   BILIRUBINUR Negative 05/14/2014 0956   KETONESUR Trace 05/14/2014 0956   PROTEINUR Negative 12/08/2015 0959   PROTEINUR Negative 05/14/2014 0956   UROBILINOGEN 0.2 12/08/2015 0959   NITRITE Negative 12/08/2015 0959   NITRITE Negative 05/14/2014 0956   LEUKOCYTESUR Negative 12/08/2015 0959   LEUKOCYTESUR 3+ 05/14/2014 0956   Sepsis Labs: @LABRCNTIP (procalcitonin:4,lacticidven:4) ) Recent Results (from the past 240 hour(s))  Novel Coronavirus, NAA ([facility])     Status: Abnormal   Collection Time: 05/12/19  9:47 AM   Specimen: Nasopharyngeal(NP) swabs in vial transport medium   NASOPHARYNGE  TESTING  Result Value Ref Range Status   SARS-CoV-2, NAA Detected (A) Not Detected Final    Comment: This nucleic acid amplification test was developed and its performance characteristics determined by [employer]. Nucleic acid amplification tests include PCR and TMA. This test has not been FDA cleared or approved. This test has been authorized by FDA under an Emergency Use Authorization (EUA). This test is only authorized for the duration of time the declaration that circumstances exist justifying the authorization of the emergency use of in vitro diagnostic tests for detection of SARS-CoV-2 virus and/or diagnosis of COVID-19 infection under section 564(b)(1) of the Act, 21 U.S.C. [id number]) (1), unless the authorization is terminated or revoked sooner. When diagnostic testing is negative, the possibility of a false negative result should be considered in the context of a patient's recent exposures and the  presence of clinical signs and symptoms consistent with COVID-19. An individual without symptoms of COVID-19 and who is not shedding SARS-CoV-2 virus would  expect to have a negative (not detected) result in this assay.      Radiological Exams on Admission: Dg Chest Portable 1 View  Result Date: 05/16/2019 CLINICAL DATA:  Chest pain, shortness of breath, vomiting and diarrhea. COVID 19 positive. EXAM: PORTABLE CHEST 1 VIEW COMPARISON:  PA and lateral chest 08/22/2017. FINDINGS: Patchy bilateral airspace disease is worse on the left. No pneumothorax or pleural effusion. Heart size is normal. No acute or focal bony abnormality. IMPRESSION: Patchy bilateral airspace disease consistent with pneumonia. Viral/atypical infection is within the differential. Electronically Signed   By: Inge Rise M.D.   On: 05/16/2019 17:21    EKG: Independently reviewed. Sinus rhythm, PVC's, RBBB, QTc 496 ms.   Assessment/Plan   1. Intractable nausea & vomiting  - Presents with several days of fevers, cough, SOB, malaise, and N/V/D, found to be positive for COVID-19 on 05/12/19  - She continued to have N/V in ED despite treatment with Zofran, Phenergan, and Reglan  - Abdominal exam benign  - Continue antiemetics, monitor QT interval, monitor electrolytes, hold Lasix    2. Pneumonia due to COVID-19  - Presents with several days of fevers, cough, SOB, malaise, and N/V/D, found to be positive for COVID-19 on 05/12/19, found to have bilateral airspace opacities on CXR, mild tachypnea, and saturations in low 90's on rm air while at rest  - Start supplemental O2 and Decadron, check/trend inflammatory markers, consider abx if procalcitonin elevated, increase ppx Lovenox dosing if D-dimer >5, continue supportive care   3. Type II DM  - A1c was 7.5% in June 2020  - She will be treated with systemic steroids as above  -  Managed with Ozempic and oral agents at home, held on admission  - Follow CBG's and use a SSI  with Novolog while in hospital   4. CAD  - No anginal complaints  - Continue statin    5. Chronic diastolic CHF  - Appears compensated  - EF was normal in 2016  - She received a liter of NS in ED  - Hold Lasix, continue ARB, follow I/O's    6. Hypothyroidism  - Continue Synthroid     PPE: CAPR, gown, gloves  DVT prophylaxis: Lovenox  Code Status: Full  Family Communication: Discussed with patient  Consults called: None  Admission status: Inpatient     Vianne Bulls, MD [facility] Pager [phone]  If 7PM-7AM, please contact night-coverage [email or web address] Password [email or web address]  05/17/2019, 12:07 AM

## 2019-05-17 NOTE — Progress Notes (Signed)
   As the day has progressed the patient has developed the need for supplemental oxygen with saturations dipping into the upper 80s.  She already has apparent infiltrates on her chest x-ray.  As a result I will now initiate both Remdesivir and Decadron therapy.  Teresa Altes, MD [facility] Office  [phone]  05/17/2019, 7:30 PM

## 2019-05-17 NOTE — Progress Notes (Signed)
Center TEAM 1 - Stepdown/ICU TEAM  Teresa [name]  [id number] DOB: 1946/07/26 DOA: 05/16/2019 PCP: [name], MD    Brief Narrative:  73 year old with a history of HTN, DM, diastolic CHF, and CAD who presented to the ED with nausea, vomiting, diarrhea, fever, cough, shortness of breath, and general malaise.  She was diagnosed with COVID-19 10/5, after having approximately 2 days of preceding symptoms.  Since that time her symptoms have only worsened to the point that she has not been able to take her medications or consume much.  She presented to the [facility] ED where she was found to have an oxygen saturation in the low 90s on room air.  Chest x-ray noted patchy bilateral airspace disease.  While in the ED she experienced unrelenting nausea with vomiting in the complete inability to tolerate oral intake.  Significant Events: 10/3 onset of sx 10/5 COVID test positive  COVID-19 specific Treatment: None thus far  Subjective: Patient is seen for follow-up visit.  Assessment & Plan:  COVID pneumonia Infiltrate apparent on chest x-ray -discontinue steroids given that patient is not hypoxic for the Recovery trial - hold Remdesivir for now as patient is entirely asymptomatic from a respiratory standpoint  Recent Labs  Lab 05/16/19 1633 05/17/19 0305  DDIMER  --  2.42*  ALT 24 22    COVID gastroenteritis - intractable nausea and vomiting  Hypokalemia  Severe hypomagnesemia  DM 2 A1c 7.10 January 2019  HTN  CAD  Chronic diastolic CHF  Hypothyroidism  DVT prophylaxis: Lovenox Code Status: FULL CODE Family Communication:  Disposition Plan:   Consultants:  none  Antimicrobials:  None  Objective: Blood pressure 132/76, pulse 62, temperature 98.5 F (36.9 C), temperature source Oral, resp. rate (!) 25, height 5\' 2"  (1.575 m), weight 86.2 kg, SpO2 92 %. No intake or output data in the 24 hours ending 05/17/19 0913 Filed Weights   05/17/19 0053  Weight:  86.2 kg    Examination: Patient seen for follow-up exam  CBC: Recent Labs  Lab 05/16/19 1633 05/17/19 0305  WBC 7.3 8.7  NEUTROABS 5.3 7.5  HGB 14.0 12.9  HCT 41.0 38.8  MCV 87.4 90.2  PLT 211 [id number]   Basic Metabolic Panel: Recent Labs  Lab 05/16/19 1633 05/17/19 0305  NA 137 138  K 3.6 3.0*  CL 99 103  CO2 21* 21*  GLUCOSE 201* 244*  BUN 10 9  CREATININE 0.57 0.61  CALCIUM 8.4* 8.0*  MG  --  1.0*  PHOS  --  1.5*   GFR: Estimated Creatinine Clearance: 63.8 mL/min (by C-G formula based on SCr of 0.61 mg/dL).  Liver Function Tests: Recent Labs  Lab 05/16/19 1633 05/17/19 0305  AST 36 26  ALT 24 22  ALKPHOS 52 45  BILITOT 1.2 0.6  PROT 7.1 6.8  ALBUMIN 3.4* 3.5    HbA1C: Hemoglobin A1C  Date/Time Value Ref Range Status  01/14/2019 08:48 AM 7.5 (A) 4.0 - 5.6 % Final  09/12/2018 11:32 AM 7.8 (A) 4.0 - 5.6 % Final  04/04/2013 10:47 AM 7.6 (H) 4.2 - 6.3 % Final    Comment:    The American Diabetes Association recommends that a primary goal of therapy should be <7% and that physicians should reevaluate the treatment regimen in patients with HbA1c values consistently >8%.    Hgb A1c MFr Bld  Date/Time Value Ref Range Status  05/21/2018 11:45 AM 7.6 (H) 4.8 - 5.6 % Final    Comment:  Prediabetes: 5.7 - 6.4          Diabetes: >6.4          Glycemic control for adults with diabetes: <7.0   07/26/2015 09:05 AM 9.6 (H) 4.8 - 5.6 % Final    Comment:             Pre-diabetes: 5.7 - 6.4          Diabetes: >6.4          Glycemic control for adults with diabetes: <7.0     CBG: Recent Labs  Lab 05/17/19 0020 05/17/19 0740  GLUCAP 207* 287*    Recent Results (from the past 240 hour(s))  Novel Coronavirus, NAA ([facility])     Status: Abnormal   Collection Time: 05/12/19  9:47 AM   Specimen: Nasopharyngeal(NP) swabs in vial transport medium   NASOPHARYNGE  TESTING  Result Value Ref Range Status   SARS-CoV-2, NAA Detected (A) Not Detected  Final    Comment: This nucleic acid amplification test was developed and its performance characteristics determined by [employer]. Nucleic acid amplification tests include PCR and TMA. This test has not been FDA cleared or approved. This test has been authorized by FDA under an Emergency Use Authorization (EUA). This test is only authorized for the duration of time the declaration that circumstances exist justifying the authorization of the emergency use of in vitro diagnostic tests for detection of SARS-CoV-2 virus and/or diagnosis of COVID-19 infection under section 564(b)(1) of the Act, 21 U.S.C. [id number]) (1), unless the authorization is terminated or revoked sooner. When diagnostic testing is negative, the possibility of a false negative result should be considered in the context of a patient's recent exposures and the presence of clinical signs and symptoms consistent with COVID-19. An individual without symptoms of COVID-19 and who is not shedding SARS-CoV-2 virus would  expect to have a negative (not detected) result in this assay.      Scheduled Meds: . allopurinol  100 mg Oral Daily  . atorvastatin  10 mg Oral q1800  . dexamethasone (DECADRON) injection  6 mg Intravenous QHS  . enoxaparin (LOVENOX) injection  40 mg Subcutaneous QHS  . gabapentin  400 mg Oral TID  . insulin aspart  0-15 Units Subcutaneous TID WC  . insulin aspart  0-5 Units Subcutaneous QHS  . levothyroxine  88 mcg Oral QAC breakfast  . losartan  25 mg Oral Daily  . sodium chloride flush  3 mL Intravenous Q12H  . sodium chloride flush  3 mL Intravenous Q12H   Continuous Infusions: . sodium chloride    . famotidine (PEPCID) IV 20 mg (05/17/19 0759)     LOS: 1 day   Cherene Altes, MD [facility] Office  [phone] Pager - Text Page per Amion  If 7PM-7AM, please contact night-coverage per Amion 05/17/2019, 9:13 AM

## 2019-05-18 ENCOUNTER — Inpatient Hospital Stay (HOSPITAL_COMMUNITY): Payer: PPO

## 2019-05-18 DIAGNOSIS — R112 Nausea with vomiting, unspecified: Secondary | ICD-10-CM

## 2019-05-18 LAB — GLUCOSE, CAPILLARY
Glucose-Capillary: 276 mg/dL — ABNORMAL HIGH (ref 70–99)
Glucose-Capillary: 409 mg/dL — ABNORMAL HIGH (ref 70–99)
Glucose-Capillary: 496 mg/dL — ABNORMAL HIGH (ref 70–99)
Glucose-Capillary: 364 mg/dL — ABNORMAL HIGH (ref 70–99)

## 2019-05-18 LAB — CBC WITH DIFFERENTIAL/PLATELET
Abs Immature Granulocytes: 0.08 10*3/uL — ABNORMAL HIGH (ref 0.00–0.07)
Basophils Absolute: 0 10*3/uL (ref 0.0–0.1)
Basophils Relative: 0 %
Eosinophils Absolute: 0 10*3/uL (ref 0.0–0.5)
Eosinophils Relative: 0 %
HCT: 40.7 % (ref 36.0–46.0)
Hemoglobin: 13.1 g/dL (ref 12.0–15.0)
Immature Granulocytes: 1 %
Lymphocytes Relative: 10 %
Lymphs Abs: 1.3 10*3/uL (ref 0.7–4.0)
MCH: 30.2 pg (ref 26.0–34.0)
MCHC: 32.2 g/dL (ref 30.0–36.0)
MCV: 93.8 fL (ref 80.0–100.0)
Monocytes Absolute: 0.3 10*3/uL (ref 0.1–1.0)
Monocytes Relative: 2 %
Neutro Abs: 11.2 10*3/uL — ABNORMAL HIGH (ref 1.7–7.7)
Neutrophils Relative %: 87 %
Platelets: 144 10*3/uL — ABNORMAL LOW (ref 150–400)
RBC: 4.34 MIL/uL (ref 3.87–5.11)
RDW: 14.6 % (ref 11.5–15.5)
WBC: 12.9 10*3/uL — ABNORMAL HIGH (ref 4.0–10.5)
nRBC: 0 % (ref 0.0–0.2)

## 2019-05-18 LAB — COMPREHENSIVE METABOLIC PANEL
ALT: 23 U/L (ref 0–44)
AST: 24 U/L (ref 15–41)
Albumin: 3.1 g/dL — ABNORMAL LOW (ref 3.5–5.0)
Alkaline Phosphatase: 62 U/L (ref 38–126)
Anion gap: 13 (ref 5–15)
BUN: 15 mg/dL (ref 8–23)
CO2: 19 mmol/L — ABNORMAL LOW (ref 22–32)
Calcium: 8.7 mg/dL — ABNORMAL LOW (ref 8.9–10.3)
Chloride: 107 mmol/L (ref 98–111)
Creatinine, Ser: 0.66 mg/dL (ref 0.44–1.00)
GFR calc Af Amer: >60 mL/min (ref >60)
GFR calc non Af Amer: >60 mL/min (ref >60)
Glucose, Bld: 219 mg/dL — ABNORMAL HIGH (ref 70–99)
Potassium: 3.7 mmol/L (ref 3.5–5.1)
Sodium: 139 mmol/L (ref 135–145)
Total Bilirubin: 0.6 mg/dL (ref 0.3–1.2)
Total Protein: 6.6 g/dL (ref 6.5–8.1)

## 2019-05-18 LAB — D-DIMER, QUANTITATIVE: D-Dimer, Quant: 2.32 ug/mL-FEU — ABNORMAL HIGH (ref 0.00–0.50)

## 2019-05-18 LAB — FERRITIN: Ferritin: 228 ng/mL (ref 11–307)

## 2019-05-18 LAB — C-REACTIVE PROTEIN: CRP: 6 mg/dL — ABNORMAL HIGH (ref <1.0)

## 2019-05-18 LAB — PHOSPHORUS: Phosphorus: 2.5 mg/dL (ref 2.5–4.6)

## 2019-05-18 LAB — MAGNESIUM: Magnesium: 1.4 mg/dL — ABNORMAL LOW (ref 1.7–2.4)

## 2019-05-18 MED ORDER — VITAMIN C 500 MG/5ML PO SYRP
500.0000 mg | ORAL_SOLUTION | Freq: Every day | ORAL | Status: DC
  Filled 2019-05-18 (×2): qty 5

## 2019-05-18 MED ORDER — MAGNESIUM SULFATE 2 GM/50ML IV SOLN: 2.0000 g | Freq: Once | INTRAVENOUS | Status: DC

## 2019-05-18 MED ORDER — INSULIN GLARGINE 100 UNIT/ML ~~LOC~~ SOLN
14.0000 [IU] | Freq: Two times a day (BID) | SUBCUTANEOUS | Status: DC
  Administered 2019-05-18 – 2019-05-19 (×3): 14 [IU] via SUBCUTANEOUS
  Filled 2019-05-18 (×4): qty 0.14

## 2019-05-18 MED ORDER — MAGNESIUM SULFATE 2 GM/50ML IV SOLN
2.0000 g | Freq: Once | INTRAVENOUS | Status: DC
  Filled 2019-05-18: qty 50

## 2019-05-18 MED ORDER — INSULIN ASPART 100 UNIT/ML ~~LOC~~ SOLN
28.0000 [IU] | Freq: Once | SUBCUTANEOUS | Status: AC
  Administered 2019-05-18: 17:00:00 28 [IU] via SUBCUTANEOUS

## 2019-05-18 MED ORDER — VITAMIN C 500 MG PO TABS
500.0000 mg | ORAL_TABLET | Freq: Every day | ORAL | Status: DC
  Administered 2019-05-18 – 2019-05-27 (×10): 500 mg via ORAL
  Filled 2019-05-18 (×10): qty 1

## 2019-05-18 MED ORDER — MAGNESIUM SULFATE 2 GM/50ML IV SOLN
2.0000 g | Freq: Once | INTRAVENOUS | Status: AC
  Administered 2019-05-18: 2 g via INTRAVENOUS
  Filled 2019-05-18: qty 50

## 2019-05-18 MED ORDER — ZINC SULFATE 220 (50 ZN) MG PO CAPS
220.0000 mg | ORAL_CAPSULE | Freq: Every day | ORAL | Status: DC
  Administered 2019-05-18 – 2019-05-27 (×10): 220 mg via ORAL
  Filled 2019-05-18 (×10): qty 1

## 2019-05-18 MED ORDER — FUROSEMIDE 10 MG/ML IJ SOLN
40.0000 mg | Freq: Every day | INTRAMUSCULAR | Status: DC
  Administered 2019-05-18: 13:00:00 40 mg via INTRAVENOUS
  Filled 2019-05-18 (×2): qty 4

## 2019-05-18 MED ORDER — VITAMIN B-1 100 MG PO TABS
100.0000 mg | ORAL_TABLET | Freq: Every day | ORAL | Status: DC
  Administered 2019-05-18 – 2019-05-27 (×10): 100 mg via ORAL
  Filled 2019-05-18 (×9): qty 1

## 2019-05-18 NOTE — Progress Notes (Signed)
[place] TEAM 1 - Stepdown/ICU TEAM  STEPHIE FLINK  [id number] DOB: 1945-12-24 DOA: 05/16/2019 PCP: [name], MD    Brief Narrative:  73 year old with a history of HTN, DM, diastolic CHF, and CAD who presented to the ED with nausea, vomiting, diarrhea, fever, cough, shortness of breath, and general malaise.  She was diagnosed with COVID-19 10/5, after having approximately 2 days of preceding symptoms.  Since that time her symptoms have only worsened to the point that she has not been able to take her medications or consume sustenance.  She presented to the [facility] ED where she was found to have an oxygen saturation in the low 90s on room air.  Chest x-ray noted patchy bilateral airspace disease.  While in the ED she experienced unrelenting nausea with vomiting in the complete inability to tolerate oral intake.  Significant Events: 10/3 onset of sx 10/5 COVID test positive 10/10 admit to [place] via [facility] ED  COVID-19 specific Treatment: Decadron 10/10 > Remdesivir 10/10 >  Subjective: Oxygen saturations decreased yesterday, requiring the initiation of low level nasal cannula support.  I have reviewed her follow-up chest x-ray from today which notes no significant change in diffuse infiltrates.  She feels somewhat more short of breath today at the time of my exam.  She states her diarrhea appears to have ceased.  She denies fevers chills or vomiting but does report ongoing nausea.  Assessment & Plan:  COVID pneumonia Infiltrate apparent on chest x-ray -with patient developing progressive hypoxia and with an apparent infiltrate on CXR - she was started on Remdesivir and Decadron 10/10 afternoon -monitor for need to escalate to other therapies  Recent Labs  Lab 05/16/19 1633 05/17/19 0305 05/18/19 0430  DDIMER  --  2.42* 2.32*  FERRITIN  --  259 228  CRP  --  7.1* 6.0*  ALT 24 22 23   PROCALCITON  --  <0.10  --     COVID gastroenteritis - intractable nausea and vomiting  Clinically improved today   Hypokalemia Supplemented with IV potassium 10/10 -recheck in a.m.  Severe hypomagnesemia Supplemented with high-dose IV magnesium 10/10 -supplement again today as patient has trended back down  DM 2 A1c 7.10 January 2019 -CBG severely uncontrolled with use of Decadron -adjust insulin therapy and follow closely  HTN Blood pressure trending upward -adjust treatment plan and follow -add diuretic and follow  CAD Asymptomatic presently  Chronic diastolic CHF follow daily weights and I's and O's -appears slightly overloaded on exam today -is on chronic Lasix at home -resume diuretic today and follow  Filed Weights   05/17/19 0053  Weight: 86.2 kg    Hypothyroidism Continue home levothyroxine treatment  DVT prophylaxis: Lovenox Code Status: FULL CODE Family Communication:  Disposition Plan: Telemetry bed  Consultants:  none  Antimicrobials:  None  Objective: Blood pressure 139/69, pulse 81, temperature 97.7 F (36.5 C), temperature source Oral, resp. rate (!) 22, height 5\' 2"  (1.575 m), weight 86.2 kg, SpO2 94 %.  Intake/Output Summary (Last 24 hours) at 05/18/2019 1612 Last data filed at 05/17/2019 1826 Gross per 24 hour  Intake 840 ml  Output -  Net 840 ml   Filed Weights   05/17/19 0053  Weight: 86.2 kg    Examination: General: Mild shortness of breath at rest Lungs: Fine bilateral basilar crackles with no wheezing Cardiovascular: RRR without murmur or rub Abdomen: NT/ND, soft, BS positive, no rebound Extremities: No C/C/E bilateral lower extremities   CBC: Recent Labs  Lab 05/16/19 1633 05/17/19 0305 05/18/19 0430  WBC 7.3 8.7 12.9*  NEUTROABS 5.3 7.5 11.2*  HGB 14.0 12.9 13.1  HCT 41.0 38.8 40.7  MCV 87.4 90.2 93.8  PLT 211 207 123456*   Basic Metabolic Panel: Recent Labs  Lab 05/16/19 1633 05/17/19 0305 05/17/19 1640 05/18/19 0430  NA 137 138  --  139  K 3.6 3.0*  --  3.7  CL 99 103  --  107  CO2 21* 21*  --   19*  GLUCOSE 201* 244*  --  219*  BUN 10 9  --  15  CREATININE 0.57 0.61  --  0.66  CALCIUM 8.4* 8.0*  --  8.7*  MG  --  1.0* 2.3 1.4*  PHOS  --  1.5*  --  2.5   GFR: Estimated Creatinine Clearance: 63.8 mL/min (by C-G formula based on SCr of 0.66 mg/dL).  Liver Function Tests: Recent Labs  Lab 05/16/19 1633 05/17/19 0305 05/18/19 0430  AST 36 26 24  ALT 24 22 23   ALKPHOS 52 45 62  BILITOT 1.2 0.6 0.6  PROT 7.1 6.8 6.6  ALBUMIN 3.4* 3.5 3.1*    HbA1C: Hemoglobin A1C  Date/Time Value Ref Range Status  01/14/2019 08:48 AM 7.5 (A) 4.0 - 5.6 % Final  09/12/2018 11:32 AM 7.8 (A) 4.0 - 5.6 % Final  04/04/2013 10:47 AM 7.6 (H) 4.2 - 6.3 % Final    Comment:    The American Diabetes Association recommends that a primary goal of therapy should be <7% and that physicians should reevaluate the treatment regimen in patients with HbA1c values consistently >8%.    Hgb A1c MFr Bld  Date/Time Value Ref Range Status  05/17/2019 03:05 AM 8.4 (H) 4.8 - 5.6 % Final    Comment:    (NOTE) Pre diabetes:          5.7%-6.4% Diabetes:              >6.4% Glycemic control for   <7.0% adults with diabetes   05/21/2018 11:45 AM 7.6 (H) 4.8 - 5.6 % Final    Comment:             Prediabetes: 5.7 - 6.4          Diabetes: >6.4          Glycemic control for adults with diabetes: <7.0     CBG: Recent Labs  Lab 05/17/19 1253 05/17/19 1720 05/17/19 2139 05/18/19 0809 05/18/19 1135  GLUCAP 353* 345* 288* 276* 409*    Recent Results (from the past 240 hour(s))  Novel Coronavirus, NAA ([facility])     Status: Abnormal   Collection Time: 05/12/19  9:47 AM   Specimen: Nasopharyngeal(NP) swabs in vial transport medium   NASOPHARYNGE  TESTING  Result Value Ref Range Status   SARS-CoV-2, NAA Detected (A) Not Detected Final    Comment: This nucleic acid amplification test was developed and its performance characteristics determined by [employer]. Nucleic acid amplification tests  include PCR and TMA. This test has not been FDA cleared or approved. This test has been authorized by FDA under an Emergency Use Authorization (EUA). This test is only authorized for the duration of time the declaration that circumstances exist justifying the authorization of the emergency use of in vitro diagnostic tests for detection of SARS-CoV-2 virus and/or diagnosis of COVID-19 infection under section 564(b)(1) of the Act, 21 U.S.C. [id number]) (1), unless the authorization is terminated or revoked sooner. When diagnostic testing is negative,  the possibility of a false negative result should be considered in the context of a patient's recent exposures and the presence of clinical signs and symptoms consistent with COVID-19. An individual without symptoms of COVID-19 and who is not shedding SARS-CoV-2 virus would  expect to have a negative (not detected) result in this assay.      Scheduled Meds: . allopurinol  100 mg Oral Daily  . atorvastatin  10 mg Oral q1800  . benzonatate  100 mg Oral TID  . dexamethasone (DECADRON) injection  6 mg Intravenous Q24H  . enoxaparin (LOVENOX) injection  40 mg Subcutaneous QHS  . famotidine  20 mg Oral BID  . furosemide  40 mg Intravenous Daily  . gabapentin  400 mg Oral TID  . insulin aspart  0-20 Units Subcutaneous TID WC  . insulin aspart  0-5 Units Subcutaneous QHS  . insulin glargine  14 Units Subcutaneous BID  . levothyroxine  88 mcg Oral QAC breakfast  . linagliptin  5 mg Oral Daily  . losartan  25 mg Oral Daily  . sodium chloride flush  3 mL Intravenous Q12H  . thiamine  100 mg Oral Daily  . vitamin C  500 mg Oral Daily  . zinc sulfate  220 mg Oral Daily   Continuous Infusions: . magnesium sulfate bolus IVPB    . remdesivir 100 mg in NS 250 mL       LOS: 2 days   Cherene Altes, MD [facility] Office  [phone] Pager - Text Page per Amion  If 7PM-7AM, please contact night-coverage per Amion 05/18/2019,  4:12 PM

## 2019-05-18 NOTE — TOC Initial Note (Signed)
Transition of Care [facility]) - Initial/Assessment Note    Patient Details  Name: Teresa [name] MRN: [id number] Date of Birth: [birth year]  Transition of Care [facility]) CM/SW Contact:    Ninfa Meeker, RN Phone Number: 05/18/2019, 12:36 PM  Clinical Narrative:   Patient is a 73 y.o. female with medical history significant for hypertension, diabetes, CHF and coronary artery disease,  department for evaluation of nausea, vomiting, diarrhea, general malaise, fevers, cough, and shortness of breath. She had been diagnosed with COVID-19 on 05/12/2019. Patient is from home. Currently on oxygen at 2L [place], Remdesivir. Case manager will continue to monitor for needs as patient medically improves. May she be blessed to do so.                       Patient Goals and CMS Choice        Expected Discharge Plan and Services                                                Prior Living Arrangements/Services                       Activities of Daily Living Home Assistive Devices/Equipment: None ADL Screening (condition at time of admission) Patient's cognitive ability adequate to safely complete daily activities?: Yes Is the patient deaf or have difficulty hearing?: No Does the patient have difficulty seeing, even when wearing glasses/contacts?: No Does the patient have difficulty concentrating, remembering, or making decisions?: No Patient able to express need for assistance with ADLs?: Yes Does the patient have difficulty dressing or bathing?: No Independently performs ADLs?: Yes (appropriate for developmental age) Does the patient have difficulty walking or climbing stairs?: No Weakness of Legs: Both Weakness of Arms/Hands: Both  Permission Sought/Granted                  Emotional Assessment              Admission diagnosis:  COVID-19 [U07.1] Patient Active Problem List   Diagnosis Date Noted  . Pneumonia due to COVID-19 virus 05/16/2019  . Intractable  nausea and vomiting 05/16/2019  . Chronic diastolic CHF (congestive heart failure) ([place]) 05/16/2019  . Encounter for screening colonoscopy   . Benign neoplasm of cecum   . Diverticulosis of large intestine without diverticulitis   . Tortuous colon   . Polyp of sigmoid colon   . Intestinal lump   . Polyneuropathy associated with underlying disease ([place]) 12/11/2016  . Microcalcifications of the breast 12/15/2015  . Back pain, chronic 06/28/2015  . Chronic airway obstruction ([place]) 06/28/2015  . 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 Essential hypertension 06/21/2009  . CAD (coronary artery disease) 06/21/2009  . FEVER UNSPECIFIED 06/21/2009  . NAUSEA 06/21/2009  . DYSPHAGIA 06/21/2009  . RUQ PAIN 06/21/2009  . HIATAL HERNIA 01/07/2008  . Hypothyroidism 11/22/2007  . ANXIETY 11/22/2007  . DEPRESSION 11/22/2007  . Hypertensive heart disease without heart failure 11/22/2007  . 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 RENAL CALCULUS 11/22/2007  . Osteoarthritis 11/22/2007  . SLEEP APNEA 11/22/2007  . HEADACHE, CHRONIC 11/22/2007  . GASTRITIS 10/23/2007  . DIVERTICULOSIS, COLON 10/23/2007  . Diabetes mellitus type II, non insulin dependent ([place]) 07/16/2007  . HYPERCHOLESTEROLEMIA 07/16/2007  . OBESITY, MODERATE 07/16/2007  . ISCHEMIC HEART DISEASE 07/16/2007  . ESOPHAGEAL REFLUX  07/16/2007  . SCLERODERMA 07/16/2007  . DYSPNEA 07/16/2007  . COUGH, CHRONIC 07/16/2007   PCP:  [name], MD Pharmacy:   [id number] - [place], [address] [place] [address] Phone: [phone] Fax: [phone]     Social Determinants of Health (SDOH) Interventions    Readmission Risk Interventions No flowsheet data found.

## 2019-05-18 NOTE — Progress Notes (Signed)
1910 Report received and care assumed from [place]. Pt resting in bed with call bell in reach. NADN No c/o needs met Will continue to monitor

## 2019-05-18 NOTE — Plan of Care (Signed)

## 2019-05-19 ENCOUNTER — Inpatient Hospital Stay: Payer: Self-pay

## 2019-05-19 LAB — CBC WITH DIFFERENTIAL/PLATELET
Abs Immature Granulocytes: 0.08 10*3/uL — ABNORMAL HIGH (ref 0.00–0.07)
Basophils Absolute: 0 10*3/uL (ref 0.0–0.1)
Basophils Relative: 0 %
Eosinophils Absolute: 0 10*3/uL (ref 0.0–0.5)
Eosinophils Relative: 0 %
HCT: 39.2 % (ref 36.0–46.0)
Hemoglobin: 12.8 g/dL (ref 12.0–15.0)
Immature Granulocytes: 1 %
Lymphocytes Relative: 10 %
Lymphs Abs: 1.1 10*3/uL (ref 0.7–4.0)
MCH: 29.6 pg (ref 26.0–34.0)
MCHC: 32.7 g/dL (ref 30.0–36.0)
MCV: 90.7 fL (ref 80.0–100.0)
Monocytes Absolute: 0.3 10*3/uL (ref 0.1–1.0)
Monocytes Relative: 3 %
Neutro Abs: 9.2 10*3/uL — ABNORMAL HIGH (ref 1.7–7.7)
Neutrophils Relative %: 86 %
Platelets: 250 10*3/uL (ref 150–400)
RBC: 4.32 MIL/uL (ref 3.87–5.11)
RDW: 14.5 % (ref 11.5–15.5)
WBC: 10.7 10*3/uL — ABNORMAL HIGH (ref 4.0–10.5)
nRBC: 0 % (ref 0.0–0.2)

## 2019-05-19 LAB — COMPREHENSIVE METABOLIC PANEL
ALT: 21 U/L (ref 0–44)
AST: 18 U/L (ref 15–41)
Albumin: 3 g/dL — ABNORMAL LOW (ref 3.5–5.0)
Alkaline Phosphatase: 74 U/L (ref 38–126)
Anion gap: 12 (ref 5–15)
BUN: 20 mg/dL (ref 8–23)
CO2: 25 mmol/L (ref 22–32)
Calcium: 8.7 mg/dL — ABNORMAL LOW (ref 8.9–10.3)
Chloride: 103 mmol/L (ref 98–111)
Creatinine, Ser: 0.55 mg/dL (ref 0.44–1.00)
GFR calc Af Amer: >60 mL/min (ref >60)
GFR calc non Af Amer: >60 mL/min (ref >60)
Glucose, Bld: 261 mg/dL — ABNORMAL HIGH (ref 70–99)
Potassium: 3.5 mmol/L (ref 3.5–5.1)
Sodium: 140 mmol/L (ref 135–145)
Total Bilirubin: 0.2 mg/dL — ABNORMAL LOW (ref 0.3–1.2)
Total Protein: 6.8 g/dL (ref 6.5–8.1)

## 2019-05-19 LAB — GLUCOSE, CAPILLARY
Glucose-Capillary: 282 mg/dL — ABNORMAL HIGH (ref 70–99)
Glucose-Capillary: 272 mg/dL — ABNORMAL HIGH (ref 70–99)
Glucose-Capillary: 520 mg/dL (ref 70–99)
Glucose-Capillary: 337 mg/dL — ABNORMAL HIGH (ref 70–99)
Glucose-Capillary: 342 mg/dL — ABNORMAL HIGH (ref 70–99)

## 2019-05-19 LAB — C-REACTIVE PROTEIN: CRP: 7.3 mg/dL — ABNORMAL HIGH (ref <1.0)

## 2019-05-19 LAB — FERRITIN: Ferritin: 232 ng/mL (ref 11–307)

## 2019-05-19 LAB — MAGNESIUM: Magnesium: 1.7 mg/dL (ref 1.7–2.4)

## 2019-05-19 LAB — D-DIMER, QUANTITATIVE: D-Dimer, Quant: 2.14 ug/mL-FEU — ABNORMAL HIGH (ref 0.00–0.50)

## 2019-05-19 MED ORDER — INSULIN ASPART 100 UNIT/ML ~~LOC~~ SOLN
10.0000 [IU] | Freq: Three times a day (TID) | SUBCUTANEOUS | Status: DC
  Administered 2019-05-19 – 2019-05-20 (×4): 10 [IU] via SUBCUTANEOUS

## 2019-05-19 MED ORDER — SALINE SPRAY 0.65 % NA SOLN
1.0000 | NASAL | Status: DC | PRN
  Administered 2019-05-19 – 2019-05-26 (×2): 1 via NASAL
  Filled 2019-05-19: qty 44

## 2019-05-19 MED ORDER — POTASSIUM CHLORIDE 10 MEQ/100ML IV SOLN
10.0000 meq | INTRAVENOUS | Status: AC
  Administered 2019-05-19 (×4): 10 meq via INTRAVENOUS
  Filled 2019-05-19 (×3): qty 100

## 2019-05-19 MED ORDER — AMLODIPINE BESYLATE 10 MG PO TABS
10.0000 mg | ORAL_TABLET | Freq: Every day | ORAL | Status: DC
  Administered 2019-05-19 – 2019-05-27 (×9): 10 mg via ORAL
  Filled 2019-05-19 (×2): qty 1
  Filled 2019-05-19 (×2): qty 2
  Filled 2019-05-19 (×5): qty 1

## 2019-05-19 MED ORDER — MAGNESIUM SULFATE 2 GM/50ML IV SOLN
2.0000 g | Freq: Once | INTRAVENOUS | Status: AC
  Administered 2019-05-19: 10:00:00 2 g via INTRAVENOUS
  Filled 2019-05-19: qty 50

## 2019-05-19 MED ORDER — FUROSEMIDE 10 MG/ML IJ SOLN
60.0000 mg | Freq: Two times a day (BID) | INTRAMUSCULAR | Status: DC
  Administered 2019-05-19 – 2019-05-20 (×3): 60 mg via INTRAVENOUS
  Filled 2019-05-19 (×2): qty 6

## 2019-05-19 MED ORDER — PHENOL 1.4 % MT LIQD
1.0000 | OROMUCOSAL | Status: DC | PRN
  Administered 2019-05-19: 1 via OROMUCOSAL
  Filled 2019-05-19: qty 177

## 2019-05-19 MED ORDER — INSULIN GLARGINE 100 UNIT/ML ~~LOC~~ SOLN
18.0000 [IU] | Freq: Two times a day (BID) | SUBCUTANEOUS | Status: DC
  Administered 2019-05-19 – 2019-05-20 (×2): 18 [IU] via SUBCUTANEOUS
  Filled 2019-05-19 (×3): qty 0.18

## 2019-05-19 MED ORDER — LIP MEDEX EX OINT
TOPICAL_OINTMENT | CUTANEOUS | Status: DC | PRN
  Filled 2019-05-19: qty 7

## 2019-05-19 MED ORDER — INSULIN ASPART 100 UNIT/ML ~~LOC~~ SOLN: 4.0000 [IU] | Freq: Three times a day (TID) | SUBCUTANEOUS | Status: DC

## 2019-05-19 NOTE — Progress Notes (Signed)
Called patients son Ron; however, he did not answer. Left message on voicemail and provided return phone number. Will update in the event of return call. Warrick Parisian, RN

## 2019-05-19 NOTE — Progress Notes (Addendum)
[place] TEAM 1 - Stepdown/ICU TEAM  Teresa [name]  [id number] DOB: 1946/03/28 DOA: 05/16/2019 PCP: [name], MD    Brief Narrative:  73 year old with a history of HTN, DM, diastolic CHF, and CAD who presented to the ED with nausea, vomiting, diarrhea, fever, cough, shortness of breath, and general malaise.  She was diagnosed with COVID-19 10/5, after having approximately 2 days of preceding symptoms.  Since that time her symptoms have only worsened to the point that she has not been able to take her medications or consume sustenance.  She presented to the [facility] ED where she was found to have an oxygen saturation in the low 90s on room air.  Chest x-ray noted patchy bilateral airspace disease.  While in the ED she experienced unrelenting nausea with vomiting in the complete inability to tolerate oral intake.  Significant Events: 10/3 onset of sx 10/5 COVID test positive 10/10 admit to [place] via [facility] ED  COVID-19 specific Treatment: Decadron 10/10 > Remdesivir 10/10 >  Subjective: Continues to require 3 L conventional nasal cannula support.  CBG improving but not yet at goal.  Tells me she still feels very poorly with generalized lethargy and significant shortness of breath.  No more diarrhea.  Some mild nausea but not severe.  Very poorly controlled CBG continues to be a problem  Assessment & Plan:  COVID pneumonia Infiltrate apparent on chest x-ray -with patient developing progressive hypoxia since admit - she was started on Remdesivir and Decadron 10/10 afternoon -monitor for need to escalate to other therapies - counseled her on potential need for actemra or convalescent plasma - she is a Sales promotion account executive Witness and reports that she would therefore not be interested in convalescent plasma -with some apparent volume overload on exam we will attempt to diurese at this time  Recent Labs  Lab 05/16/19 1633 05/17/19 0305 05/18/19 0430 05/19/19 0135  DDIMER  --  2.42* 2.32*  2.14*  FERRITIN  --  259 228 232  CRP  --  7.1* 6.0* 7.3*  ALT 24 22 23 21   PROCALCITON  --  <0.10  --   --     COVID gastroenteritis - intractable nausea and vomiting Clinically improved -continue supportive care  Hypokalemia Proving refractory -continue to supplement after magnesium replaced  Severe hypomagnesemia Proving refractory -felt to be due to poor intake and significant GI losses -continue to supplement and follow  DM 2 A1c 7.10 January 2019 -CBG severely uncontrolled with use of Decadron -significantly increased treatment again today and follow trend  HTN Blood pressure remains poorly controlled -adjust treatment again today and follow  CAD Asymptomatic presently  Chronic diastolic CHF follow daily weights and I's and O's -appears slightly overloaded on exam again today -is on chronic Lasix at home - cont lasix at increased dose   Filed Weights   05/17/19 0053  Weight: 86.2 kg    Hypothyroidism Continue home levothyroxine treatment  DVT prophylaxis: Lovenox Code Status: FULL CODE Family Communication: spoke w/ son via telephone  Disposition Plan: Telemetry bed  Consultants:  none  Antimicrobials:  None  Objective: Blood pressure (!) 160/78, pulse 60, temperature 99.8 F (37.7 C), temperature source Oral, resp. rate 16, height 5\' 2"  (1.575 m), weight 86.2 kg, SpO2 96 %.  Intake/Output Summary (Last 24 hours) at 05/19/2019 0839 Last data filed at 05/19/2019 0400 Gross per 24 hour  Intake 1483 ml  Output 4000 ml  Net -2517 ml   Filed Weights   05/17/19 0053  Weight: 86.2 kg    Examination: General: Mild shortness of breath at rest evident during conversation Lungs: Fine bilateral basilar crackles with no change Cardiovascular: RRR Abdomen: NT/ND, soft, BS positive, no rebound Extremities: 1+ bilateral lower extremity edema   CBC: Recent Labs  Lab 05/17/19 0305 05/18/19 0430 05/19/19 0135  WBC 8.7 12.9* 10.7*  NEUTROABS 7.5 11.2* 9.2*   HGB 12.9 13.1 12.8  HCT 38.8 40.7 39.2  MCV 90.2 93.8 90.7  PLT 207 144* [id number]   Basic Metabolic Panel: Recent Labs  Lab 05/17/19 0305 05/17/19 1640 05/18/19 0430 05/19/19 0135  NA 138  --  139 140  K 3.0*  --  3.7 3.5  CL 103  --  107 103  CO2 21*  --  19* 25  GLUCOSE 244*  --  219* 261*  BUN 9  --  15 20  CREATININE 0.61  --  0.66 0.55  CALCIUM 8.0*  --  8.7* 8.7*  MG 1.0* 2.3 1.4* 1.7  PHOS 1.5*  --  2.5  --    GFR: Estimated Creatinine Clearance: 63.8 mL/min (by C-G formula based on SCr of 0.55 mg/dL).  Liver Function Tests: Recent Labs  Lab 05/16/19 1633 05/17/19 0305 05/18/19 0430 05/19/19 0135  AST 36 26 24 18   ALT 24 22 23 21   ALKPHOS 52 45 62 74  BILITOT 1.2 0.6 0.6 0.2*  PROT 7.1 6.8 6.6 6.8  ALBUMIN 3.4* 3.5 3.1* 3.0*    HbA1C: Hemoglobin A1C  Date/Time Value Ref Range Status  01/14/2019 08:48 AM 7.5 (A) 4.0 - 5.6 % Final  09/12/2018 11:32 AM 7.8 (A) 4.0 - 5.6 % Final  04/04/2013 10:47 AM 7.6 (H) 4.2 - 6.3 % Final    Comment:    The American Diabetes Association recommends that a primary goal of therapy should be <7% and that physicians should reevaluate the treatment regimen in patients with HbA1c values consistently >8%.    Hgb A1c MFr Bld  Date/Time Value Ref Range Status  05/17/2019 03:05 AM 8.4 (H) 4.8 - 5.6 % Final    Comment:    (NOTE) Pre diabetes:          5.7%-6.4% Diabetes:              >6.4% Glycemic control for   <7.0% adults with diabetes   05/21/2018 11:45 AM 7.6 (H) 4.8 - 5.6 % Final    Comment:             Prediabetes: 5.7 - 6.4          Diabetes: >6.4          Glycemic control for adults with diabetes: <7.0     CBG: Recent Labs  Lab 05/18/19 1135 05/18/19 1642 05/18/19 2119 05/19/19 0706 05/19/19 0808  GLUCAP 409* 496* 364* 282* 272*    Recent Results (from the past 240 hour(s))  Novel Coronavirus, NAA ([facility])     Status: Abnormal   Collection Time: 05/12/19  9:47 AM   Specimen: Nasopharyngeal(NP) swabs  in vial transport medium   NASOPHARYNGE  TESTING  Result Value Ref Range Status   SARS-CoV-2, NAA Detected (A) Not Detected Final    Comment: This nucleic acid amplification test was developed and its performance characteristics determined by [employer]. Nucleic acid amplification tests include PCR and TMA. This test has not been FDA cleared or approved. This test has been authorized by FDA under an Emergency Use Authorization (EUA). This test is only authorized for the duration  of time the declaration that circumstances exist justifying the authorization of the emergency use of in vitro diagnostic tests for detection of SARS-CoV-2 virus and/or diagnosis of COVID-19 infection under section 564(b)(1) of the Act, 21 U.S.C. [id number]) (1), unless the authorization is terminated or revoked sooner. When diagnostic testing is negative, the possibility of a false negative result should be considered in the context of a patient's recent exposures and the presence of clinical signs and symptoms consistent with COVID-19. An individual without symptoms of COVID-19 and who is not shedding SARS-CoV-2 virus would  expect to have a negative (not detected) result in this assay.      Scheduled Meds: . allopurinol  100 mg Oral Daily  . atorvastatin  10 mg Oral q1800  . benzonatate  100 mg Oral TID  . dexamethasone (DECADRON) injection  6 mg Intravenous Q24H  . enoxaparin (LOVENOX) injection  40 mg Subcutaneous QHS  . famotidine  20 mg Oral BID  . furosemide  40 mg Intravenous Daily  . gabapentin  400 mg Oral TID  . insulin aspart  0-20 Units Subcutaneous TID WC  . insulin aspart  0-5 Units Subcutaneous QHS  . insulin glargine  14 Units Subcutaneous BID  . levothyroxine  88 mcg Oral QAC breakfast  . linagliptin  5 mg Oral Daily  . losartan  25 mg Oral Daily  . sodium chloride flush  3 mL Intravenous Q12H  . thiamine  100 mg Oral Daily  . vitamin C  500 mg Oral Daily  . zinc sulfate   220 mg Oral Daily   Continuous Infusions: . remdesivir 100 mg in NS 250 mL 100 mg (05/18/19 1714)     LOS: 3 days   Cherene Altes, MD [facility] Office  [phone] Pager - Text Page per Shea Evans  If 7PM-7AM, please contact night-coverage per Amion 05/19/2019, 8:39 AM

## 2019-05-19 NOTE — Progress Notes (Signed)
Inpatient Diabetes Program Recommendations  AACE/ADA: New Consensus Statement on Inpatient Glycemic Control (2015)  Target Ranges:  Prepandial:   less than 140 mg/dL      Peak postprandial:   less than 180 mg/dL (1-2 hours)      Critically ill patients:  140 - 180 mg/dL   Lab Results  Component Value Date   [id number] (HH) 05/19/2019   HGBA1C 8.4 (H) 05/17/2019    Review of Glycemic Control Results for NILEY, ASSENMACHER (MRN [id number]) as of 05/19/2019 12:12  Ref. Range 05/18/2019 16:42 05/18/2019 21:19 05/19/2019 07:06 05/19/2019 08:08 05/19/2019 11:29  Glucose-Capillary Latest Ref Range: 70 - 99 mg/dL 496 (H) Novolog 28 units 364 (H) Novolog 5 units 282 (H) 272 (H) Novolog 11 units 520 (HH)   Diabetes history: DM2 Outpatient Diabetes medications: Amaryl 4 mg bid + Metoformin 1 gm qd + Jardiance 25 mg qd Current orders for Inpatient glycemic control: Lantus 14 units bid + Novolog 4 units tid meal coverage + Novolog resistant tid + hs 0-5 units + Tradjenta 5 mg  Inpatient Diabetes Program Recommendations:   Noted Novolog meal coverage just started. Please consider increase Novolog to 8 units tid if eats 50%  Thank you, Nani Gasser. Novah Goza, RN, MSN, CDE  Diabetes Coordinator Inpatient Glycemic Control Team Team Pager [phone] (8am-5pm) 05/19/2019 12:16 PM

## 2019-05-19 NOTE — Progress Notes (Signed)
Pt moved from bed to chair and back multiple times today; used IS independently; c/o greater weakness and SHOB today from yesterday.  4 runs of K were ordered but had to be run at 29mL/hr d/t burning; per pt is very hard stick; pt called me on my phone stating her IV was leaking.  The PIV with Remdesivir had infiltrated despite being run at 87mL/hr instead of 200 mL/hr which is standard on the pump.  Pt stated had pushed the call light and told the sec.  This had happened multiple times today whereby I didn't receive the communication.  I told the charge RN about the infiltration to see if care above the standard was needed.  After d/cing the PIV, I applied a warm compress and directed the pt to continue to elevate it.  I sent a communication to the MD suggesting a PICC may be needed d/t her limited access.  The MD placed an order for the PICC.

## 2019-05-19 NOTE — Progress Notes (Signed)
Spoke with Jonathon Resides, RN concerning PICC order. PICC will not be placed today. Patient has two PIVs documented; however, according to [place], RN one has been loss. She was instructed to attempt/involve other staff to attempt. If unsuccessful, to place a IV consult. PICC placement to be re-evaluated in the morning.

## 2019-05-20 ENCOUNTER — Ambulatory Visit: Payer: Self-pay | Admitting: Family Medicine

## 2019-05-20 LAB — CBC WITH DIFFERENTIAL/PLATELET
Abs Immature Granulocytes: 0.15 10*3/uL — ABNORMAL HIGH (ref 0.00–0.07)
Basophils Absolute: 0.1 10*3/uL (ref 0.0–0.1)
Basophils Relative: 0 %
Eosinophils Absolute: 0 10*3/uL (ref 0.0–0.5)
Eosinophils Relative: 0 %
HCT: 39.2 % (ref 36.0–46.0)
Hemoglobin: 13 g/dL (ref 12.0–15.0)
Immature Granulocytes: 1 %
Lymphocytes Relative: 12 %
Lymphs Abs: 1.3 10*3/uL (ref 0.7–4.0)
MCH: 30 pg (ref 26.0–34.0)
MCHC: 33.2 g/dL (ref 30.0–36.0)
MCV: 90.3 fL (ref 80.0–100.0)
Monocytes Absolute: 0.5 10*3/uL (ref 0.1–1.0)
Monocytes Relative: 4 %
Neutro Abs: 9.1 10*3/uL — ABNORMAL HIGH (ref 1.7–7.7)
Neutrophils Relative %: 83 %
Platelets: 283 10*3/uL (ref 150–400)
RBC: 4.34 MIL/uL (ref 3.87–5.11)
RDW: 14.3 % (ref 11.5–15.5)
WBC: 11.1 10*3/uL — ABNORMAL HIGH (ref 4.0–10.5)
nRBC: 0 % (ref 0.0–0.2)

## 2019-05-20 LAB — COMPREHENSIVE METABOLIC PANEL
ALT: 18 U/L (ref 0–44)
AST: 16 U/L (ref 15–41)
Albumin: 3 g/dL — ABNORMAL LOW (ref 3.5–5.0)
Alkaline Phosphatase: 89 U/L (ref 38–126)
Anion gap: 14 (ref 5–15)
BUN: 23 mg/dL (ref 8–23)
CO2: 23 mmol/L (ref 22–32)
Calcium: 8.4 mg/dL — ABNORMAL LOW (ref 8.9–10.3)
Chloride: 100 mmol/L (ref 98–111)
Creatinine, Ser: 0.56 mg/dL (ref 0.44–1.00)
GFR calc Af Amer: >60 mL/min (ref >60)
GFR calc non Af Amer: >60 mL/min (ref >60)
Glucose, Bld: 375 mg/dL — ABNORMAL HIGH (ref 70–99)
Potassium: 4 mmol/L (ref 3.5–5.1)
Sodium: 137 mmol/L (ref 135–145)
Total Bilirubin: 0.5 mg/dL (ref 0.3–1.2)
Total Protein: 6.5 g/dL (ref 6.5–8.1)

## 2019-05-20 LAB — GLUCOSE, CAPILLARY
Glucose-Capillary: 308 mg/dL — ABNORMAL HIGH (ref 70–99)
Glucose-Capillary: 382 mg/dL — ABNORMAL HIGH (ref 70–99)
Glucose-Capillary: 346 mg/dL — ABNORMAL HIGH (ref 70–99)
Glucose-Capillary: 362 mg/dL — ABNORMAL HIGH (ref 70–99)

## 2019-05-20 LAB — FERRITIN: Ferritin: 263 ng/mL (ref 11–307)

## 2019-05-20 LAB — C-REACTIVE PROTEIN: CRP: 4 mg/dL — ABNORMAL HIGH (ref <1.0)

## 2019-05-20 LAB — MAGNESIUM: Magnesium: 1.3 mg/dL — ABNORMAL LOW (ref 1.7–2.4)

## 2019-05-20 LAB — D-DIMER, QUANTITATIVE: D-Dimer, Quant: 1.7 ug/mL-FEU — ABNORMAL HIGH (ref 0.00–0.50)

## 2019-05-20 MED ORDER — BISACODYL 10 MG RE SUPP: 10.0000 mg | Freq: Every day | RECTAL | Status: DC | PRN

## 2019-05-20 MED ORDER — INSULIN GLARGINE 100 UNIT/ML ~~LOC~~ SOLN
22.0000 [IU] | Freq: Two times a day (BID) | SUBCUTANEOUS | Status: DC
  Administered 2019-05-20 – 2019-05-21 (×2): 22 [IU] via SUBCUTANEOUS
  Filled 2019-05-20 (×3): qty 0.22

## 2019-05-20 MED ORDER — INFLUENZA VAC A&B SA ADJ QUAD 0.5 ML IM PRSY
0.5000 mL | PREFILLED_SYRINGE | INTRAMUSCULAR | Status: AC | PRN
  Administered 2019-05-27: 0.5 mL via INTRAMUSCULAR
  Filled 2019-05-20: qty 0.5

## 2019-05-20 MED ORDER — SENNOSIDES-DOCUSATE SODIUM 8.6-50 MG PO TABS
1.0000 | ORAL_TABLET | Freq: Two times a day (BID) | ORAL | Status: DC | PRN
  Administered 2019-05-20: 1 via ORAL
  Filled 2019-05-20: qty 1

## 2019-05-20 MED ORDER — PNEUMOCOCCAL VAC POLYVALENT 25 MCG/0.5ML IJ INJ
0.5000 mL | INJECTION | INTRAMUSCULAR | Status: AC
  Administered 2019-05-27: 0.5 mL via INTRAMUSCULAR
  Filled 2019-05-20 (×2): qty 0.5

## 2019-05-20 MED ORDER — SODIUM CHLORIDE 0.9% FLUSH: 10.0000 mL | INTRAVENOUS | Status: DC | PRN

## 2019-05-20 MED ORDER — INSULIN ASPART 100 UNIT/ML ~~LOC~~ SOLN
15.0000 [IU] | Freq: Three times a day (TID) | SUBCUTANEOUS | Status: DC
  Administered 2019-05-20 – 2019-05-21 (×4): 15 [IU] via SUBCUTANEOUS

## 2019-05-20 MED ORDER — CHLORHEXIDINE GLUCONATE CLOTH 2 % EX PADS
6.0000 | MEDICATED_PAD | Freq: Every day | CUTANEOUS | Status: DC
  Administered 2019-05-20 – 2019-05-27 (×8): 6 via TOPICAL

## 2019-05-20 MED ORDER — INFLUENZA VAC A&B SA ADJ QUAD 0.5 ML IM PRSY
0.5000 mL | PREFILLED_SYRINGE | INTRAMUSCULAR | Status: DC
  Filled 2019-05-20: qty 0.5

## 2019-05-20 MED ORDER — MAGNESIUM SULFATE 4 GM/100ML IV SOLN
4.0000 g | Freq: Once | INTRAVENOUS | Status: AC
  Administered 2019-05-20: 4 g via INTRAVENOUS
  Filled 2019-05-20: qty 100

## 2019-05-20 MED ORDER — FUROSEMIDE 10 MG/ML IJ SOLN
80.0000 mg | Freq: Two times a day (BID) | INTRAMUSCULAR | Status: DC
  Administered 2019-05-20 – 2019-05-27 (×14): 80 mg via INTRAVENOUS
  Filled 2019-05-20 (×14): qty 8

## 2019-05-20 NOTE — Progress Notes (Signed)
[place] TEAM 1 - Stepdown/ICU TEAM  Teresa [name]  [id number] DOB: 1946/01/14 DOA: 05/16/2019 PCP: [name], MD    Brief Narrative:  73 year old with a history of HTN, DM, diastolic CHF, and CAD who presented to the ED with nausea, vomiting, diarrhea, fever, cough, shortness of breath, and general malaise.  She was diagnosed with COVID-19 10/5, after having approximately 2 days of preceding symptoms.  Since that time her symptoms have only worsened to the point that she has not been able to take her medications or consume sustenance.  She presented to the [facility] ED where she was found to have an oxygen saturation in the low 90s on room air.  Chest x-ray noted patchy bilateral airspace disease.  While in the ED she experienced unrelenting nausea with vomiting in the complete inability to tolerate oral intake.  Significant Events: 10/3 onset of sx 10/5 COVID test positive 10/10 admit to [place] via [facility] ED  COVID-19 specific Treatment: Decadron 10/10 > Remdesivir 10/10 >  Subjective: Saturations are holding steady on 3 L conventional nasal cannula.  The patient states she feels no better or no worse.  Overall she still feels very fatigued.  She has poor appetite.  She has not had any further diarrhea.  Her nausea is abating.  Assessment & Plan:  COVID pneumonia Infiltrate apparent on chest x-ray - patient developed progressive hypoxia since admit - she was started on Remdesivir and Decadron 10/10 afternoon -monitor for need to escalate to other therapies - counseled her on potential need for actemra or convalescent plasma - she is a Sales promotion account executive Witness and reports that she would therefore not be interested in convalescent plasma -continue with diuresis as patient actually remains in a positive fluid balance for her admission of approximately 800 cc  Recent Labs  Lab 05/16/19 1633 05/17/19 0305 05/18/19 0430 05/19/19 0135  DDIMER  --  2.42* 2.32* 2.14*  FERRITIN  --  259 228  232  CRP  --  7.1* 6.0* 7.3*  ALT 24 22 23 21   PROCALCITON  --  <0.10  --   --     COVID gastroenteritis - intractable nausea and vomiting Clinically improved -continue supportive care  Hypokalemia Has now corrected with multiple supplemental doses -recheck in a.m. with ongoing diuretic use  Severe hypomagnesemia Proving refractory -felt to be due to poor intake and significant GI losses -supplement again today and follow  DM 2 A1c 7.10 January 2019 -CBG severely uncontrolled with use of Decadron -adjust treatment again today and follow response closely  HTN Blood pressure somewhat variable at this time -continue diuresis but make no other adjustments in treatment at this time  CAD Asymptomatic presently  Chronic diastolic CHF follow daily weights and I's and O's -still appears slightly overloaded on exam -is on chronic Lasix at home - cont lasix at increased dose   Filed Weights   05/17/19 0053  Weight: 86.2 kg    Hypothyroidism Continue home levothyroxine treatment  DVT prophylaxis: Lovenox Code Status: FULL CODE Family Communication: spoke w/ son via telephone  Disposition Plan: Telemetry bed  Consultants:  none  Antimicrobials:  None  Objective: Blood pressure 123/60, pulse 75, temperature 97.7 F (36.5 C), temperature source Oral, resp. rate (!) 25, height 5\' 2"  (1.575 m), weight 86.2 kg, SpO2 93 %.  Intake/Output Summary (Last 24 hours) at 05/20/2019 0728 Last data filed at 05/20/2019 0028 Gross per 24 hour  Intake 960 ml  Output -  Net 960 ml  Filed Weights   05/17/19 0053  Weight: 86.2 kg    Examination: General: No acute extremitas but some mild dyspnea with prolonged conversation Lungs: Fine bilateral basilar crackles which are stable -no wheezing Cardiovascular: RRR without murmur Abdomen: NT/ND, soft, BS positive, no rebound Extremities: 1+ B LE edema w/o change   CBC: Recent Labs  Lab 05/17/19 0305 05/18/19 0430 05/19/19 0135  WBC  8.7 12.9* 10.7*  NEUTROABS 7.5 11.2* 9.2*  HGB 12.9 13.1 12.8  HCT 38.8 40.7 39.2  MCV 90.2 93.8 90.7  PLT 207 144* [id number]   Basic Metabolic Panel: Recent Labs  Lab 05/17/19 0305 05/17/19 1640 05/18/19 0430 05/19/19 0135  NA 138  --  139 140  K 3.0*  --  3.7 3.5  CL 103  --  107 103  CO2 21*  --  19* 25  GLUCOSE 244*  --  219* 261*  BUN 9  --  15 20  CREATININE 0.61  --  0.66 0.55  CALCIUM 8.0*  --  8.7* 8.7*  MG 1.0* 2.3 1.4* 1.7  PHOS 1.5*  --  2.5  --    GFR: Estimated Creatinine Clearance: 63.8 mL/min (by C-G formula based on SCr of 0.55 mg/dL).  Liver Function Tests: Recent Labs  Lab 05/16/19 1633 05/17/19 0305 05/18/19 0430 05/19/19 0135  AST 36 26 24 18   ALT 24 22 23 21   ALKPHOS 52 45 62 74  BILITOT 1.2 0.6 0.6 0.2*  PROT 7.1 6.8 6.6 6.8  ALBUMIN 3.4* 3.5 3.1* 3.0*    HbA1C: Hemoglobin A1C  Date/Time Value Ref Range Status  01/14/2019 08:48 AM 7.5 (A) 4.0 - 5.6 % Final  09/12/2018 11:32 AM 7.8 (A) 4.0 - 5.6 % Final  04/04/2013 10:47 AM 7.6 (H) 4.2 - 6.3 % Final    Comment:    The American Diabetes Association recommends that a primary goal of therapy should be <7% and that physicians should reevaluate the treatment regimen in patients with HbA1c values consistently >8%.    Hgb A1c MFr Bld  Date/Time Value Ref Range Status  05/17/2019 03:05 AM 8.4 (H) 4.8 - 5.6 % Final    Comment:    (NOTE) Pre diabetes:          5.7%-6.4% Diabetes:              >6.4% Glycemic control for   <7.0% adults with diabetes   05/21/2018 11:45 AM 7.6 (H) 4.8 - 5.6 % Final    Comment:             Prediabetes: 5.7 - 6.4          Diabetes: >6.4          Glycemic control for adults with diabetes: <7.0     CBG: Recent Labs  Lab 05/19/19 0706 05/19/19 0808 05/19/19 1129 05/19/19 1657 05/19/19 2145  GLUCAP 282* 272* 520* 337* 342*    Recent Results (from the past 240 hour(s))  Novel Coronavirus, NAA ([facility])     Status: Abnormal   Collection Time: 05/12/19   9:47 AM   Specimen: Nasopharyngeal(NP) swabs in vial transport medium   NASOPHARYNGE  TESTING  Result Value Ref Range Status   SARS-CoV-2, NAA Detected (A) Not Detected Final    Comment: This nucleic acid amplification test was developed and its performance characteristics determined by [employer]. Nucleic acid amplification tests include PCR and TMA. This test has not been FDA cleared or approved. This test has been authorized by FDA under  an Emergency Use Authorization (EUA). This test is only authorized for the duration of time the declaration that circumstances exist justifying the authorization of the emergency use of in vitro diagnostic tests for detection of SARS-CoV-2 virus and/or diagnosis of COVID-19 infection under section 564(b)(1) of the Act, 21 U.S.C. [id number]) (1), unless the authorization is terminated or revoked sooner. When diagnostic testing is negative, the possibility of a false negative result should be considered in the context of a patient's recent exposures and the presence of clinical signs and symptoms consistent with COVID-19. An individual without symptoms of COVID-19 and who is not shedding SARS-CoV-2 virus would  expect to have a negative (not detected) result in this assay.      Scheduled Meds: . allopurinol  100 mg Oral Daily  . amLODipine  10 mg Oral Daily  . atorvastatin  10 mg Oral q1800  . benzonatate  100 mg Oral TID  . dexamethasone (DECADRON) injection  6 mg Intravenous Q24H  . enoxaparin (LOVENOX) injection  40 mg Subcutaneous QHS  . famotidine  20 mg Oral BID  . furosemide  60 mg Intravenous Q12H  . gabapentin  400 mg Oral TID  . insulin aspart  0-20 Units Subcutaneous TID WC  . insulin aspart  0-5 Units Subcutaneous QHS  . insulin aspart  10 Units Subcutaneous TID WC  . insulin glargine  18 Units Subcutaneous BID  . levothyroxine  88 mcg Oral QAC breakfast  . linagliptin  5 mg Oral Daily  . losartan  25 mg Oral Daily  .  sodium chloride flush  3 mL Intravenous Q12H  . thiamine  100 mg Oral Daily  . vitamin C  500 mg Oral Daily  . zinc sulfate  220 mg Oral Daily   Continuous Infusions: . remdesivir 100 mg in NS 250 mL 100 mg (05/19/19 1700)     LOS: 4 days   Cherene Altes, MD [facility] Office  [phone] Pager - Text Page per Shea Evans  If 7PM-7AM, please contact night-coverage per Amion 05/20/2019, 7:28 AM

## 2019-05-20 NOTE — Progress Notes (Signed)
Inpatient Diabetes Program Recommendations  AACE/ADA: New Consensus Statement on Inpatient Glycemic Control (2015)  Target Ranges:  Prepandial:   less than 140 mg/dL      Peak postprandial:   less than 180 mg/dL (1-2 hours)      Critically ill patients:  140 - 180 mg/dL   Lab Results  Component Value Date   GLUCAP 382 (H) 05/20/2019   HGBA1C 8.4 (H) 05/17/2019    Review of Glycemic Control Results for Teresa [name], Teresa [name] (MRN [id number]) as of 05/20/2019 12:42  Ref. Range 05/19/2019 08:08 05/19/2019 11:29 05/19/2019 16:57 05/19/2019 21:45 05/20/2019 07:59 05/20/2019 11:36  Glucose-Capillary Latest Ref Range: 70 - 99 mg/dL 272 (H) Novolog 11 units 520 (HH) Novolog 30 units 337 (H) Novolog 25 units 342 (H) Novolog 2 units 308 (H) Novolog 25 units 382 (H) Novolog 30 units   Diabetes history: DM2 Outpatient Diabetes medications: Amaryl 4 mg bid + Metoformin 1 gm qd + Jardiance 25 mg qd Current orders for Inpatient glycemic control: Lantus 18 units bid + Novolog 10 units tid meal coverage + Novolog resistant tid + hs 0-5 units + Tradjenta 5 mg  Inpatient Diabetes Program Recommendations:   Noted patient has hyperglycemia with increases made in basal and meal coverage insulin. While on steroids: -Increase Lantus to 20 units bid -Increase Novolog meal coverage to 15 units tid if eats 50%  Thank you, Bethena Roys E. Lacrisha Bielicki, RN, MSN, CDE  Diabetes Coordinator Inpatient Glycemic Control Team Team Pager [phone] (8am-5pm) 05/20/2019 12:46 PM

## 2019-05-20 NOTE — Progress Notes (Signed)
Peripherally Inserted Central Catheter/Midline Placement  The IV Nurse has discussed with the patient and/or persons authorized to consent for the patient, the purpose of this procedure and the potential benefits and risks involved with this procedure.  The benefits include less needle sticks, lab draws from the catheter, and the patient may be discharged home with the catheter. Risks include, but not limited to, infection, bleeding, blood clot (thrombus formation), and puncture of an artery; nerve damage and irregular heartbeat and possibility to perform a PICC exchange if needed/ordered by physician.  Alternatives to this procedure were also discussed.  Bard Power PICC patient education guide, fact sheet on infection prevention and patient information card has been provided to patient /or left at bedside.    PICC/Midline Placement Documentation  PICC Double Lumen [id number] PICC Right Basilic 37 cm 0 cm (Active)  Indication for Insertion or Continuance of Line Prolonged intravenous therapies 05/20/19 1725  Exposed Catheter (cm) 0 cm 05/20/19 1725  Site Assessment Clean;Dry;Intact 05/20/19 1725  Lumen #1 Status Flushed;Saline locked;Blood return noted 05/20/19 1725  Lumen #2 Status Flushed;Saline locked;Blood return noted 05/20/19 1725  Dressing Type Transparent;Securing device 05/20/19 1725  Dressing Status Clean;Dry;Intact;Antimicrobial disc in place 05/20/19 1725  Dressing Intervention New dressing 05/20/19 1725  Dressing Change Due 05/27/19 05/20/19 [place], Eston Heslin 05/20/2019, 5:28 PM

## 2019-05-20 NOTE — Progress Notes (Signed)
Spoke with patients son, Chriss Czar, gave him updates on mothers progress and plan of care. All questions answered at this time. Warrick Parisian, RN

## 2019-05-21 DIAGNOSIS — I10 Essential (primary) hypertension: Secondary | ICD-10-CM

## 2019-05-21 LAB — FERRITIN: Ferritin: 274 ng/mL (ref 11–307)

## 2019-05-21 LAB — BASIC METABOLIC PANEL
Anion gap: 11 (ref 5–15)
BUN: 23 mg/dL (ref 8–23)
CO2: 29 mmol/L (ref 22–32)
Calcium: 8 mg/dL — ABNORMAL LOW (ref 8.9–10.3)
Chloride: 96 mmol/L — ABNORMAL LOW (ref 98–111)
Creatinine, Ser: 0.52 mg/dL (ref 0.44–1.00)
GFR calc Af Amer: >60 mL/min (ref >60)
GFR calc non Af Amer: >60 mL/min (ref >60)
Glucose, Bld: 301 mg/dL — ABNORMAL HIGH (ref 70–99)
Potassium: 3.5 mmol/L (ref 3.5–5.1)
Sodium: 136 mmol/L (ref 135–145)

## 2019-05-21 LAB — GLUCOSE, CAPILLARY
Glucose-Capillary: 230 mg/dL — ABNORMAL HIGH (ref 70–99)
Glucose-Capillary: 403 mg/dL — ABNORMAL HIGH (ref 70–99)
Glucose-Capillary: 404 mg/dL — ABNORMAL HIGH (ref 70–99)
Glucose-Capillary: 412 mg/dL — ABNORMAL HIGH (ref 70–99)

## 2019-05-21 LAB — C-REACTIVE PROTEIN: CRP: 1.7 mg/dL — ABNORMAL HIGH (ref <1.0)

## 2019-05-21 LAB — D-DIMER, QUANTITATIVE: D-Dimer, Quant: 1.4 ug/mL-FEU — ABNORMAL HIGH (ref 0.00–0.50)

## 2019-05-21 LAB — MAGNESIUM: Magnesium: 1.6 mg/dL — ABNORMAL LOW (ref 1.7–2.4)

## 2019-05-21 MED ORDER — INSULIN GLARGINE 100 UNIT/ML ~~LOC~~ SOLN
30.0000 [IU] | Freq: Two times a day (BID) | SUBCUTANEOUS | Status: DC
  Administered 2019-05-21: 30 [IU] via SUBCUTANEOUS
  Filled 2019-05-21 (×2): qty 0.3

## 2019-05-21 MED ORDER — INSULIN ASPART 100 UNIT/ML ~~LOC~~ SOLN
18.0000 [IU] | Freq: Three times a day (TID) | SUBCUTANEOUS | Status: DC
  Administered 2019-05-22 (×2): 18 [IU] via SUBCUTANEOUS

## 2019-05-21 MED ORDER — OXYMETAZOLINE HCL 0.05 % NA SOLN
1.0000 | Freq: Two times a day (BID) | NASAL | Status: DC
  Administered 2019-05-21 – 2019-05-27 (×12): 1 via NASAL
  Filled 2019-05-21 (×2): qty 15

## 2019-05-21 MED ORDER — INSULIN ASPART 100 UNIT/ML ~~LOC~~ SOLN
5.0000 [IU] | Freq: Once | SUBCUTANEOUS | Status: AC
  Administered 2019-05-21: 5 [IU] via SUBCUTANEOUS

## 2019-05-21 MED ORDER — MAGNESIUM SULFATE 4 GM/100ML IV SOLN
4.0000 g | Freq: Once | INTRAVENOUS | Status: AC
  Administered 2019-05-21: 4 g via INTRAVENOUS
  Filled 2019-05-21: qty 100

## 2019-05-21 MED ORDER — POTASSIUM CHLORIDE CRYS ER 20 MEQ PO TBCR
40.0000 meq | EXTENDED_RELEASE_TABLET | Freq: Once | ORAL | Status: AC
  Administered 2019-05-21: 16:00:00 40 meq via ORAL
  Filled 2019-05-21: qty 2

## 2019-05-21 NOTE — Evaluation (Signed)
Occupational Therapy Evaluation Patient Details Name: Teresa [name] MRN: [id number] DOB: April 04, 1946 Today's Date: 05/21/2019    History of Present Illness 73 y.o. female with medical history significant for HTN, DM, CHF with preserved EF in 2016, and CAD, DOE, edema, heart murmur, orthopnea,sleep apnea, vertigo , presented to the ED w/ nausea, vomiting, diarrhea, general malaise, fevers, cough, and shortness of breath.  She had been diagnosed with COVID-19 on 05/12/2019. She has since gone on to develop cough, shortness of breath, generalized abdominal discomfort, nausea, and nonbloody vomiting.   Clinical Impression   This 73 y/o female presents with the above. 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 Pt requiring minguard assist overall for functional transfers this session, with x1 instance of LOB requiring minA to correct. She currently requires min-modA for LB ADL, setup assist for seated UB ADL and minguard assist for toileting. Pt initially on 2L O2 during session with lowest SpO2 noted 84%, increased to 3L as pt requiring increased time to recover sats with SpO2 returning to 88-90% seated EOB end of session. She will benefit from continued acute OT services and recommend post acute therapy services. Pt lives alone so will need to monitor for pt progress to determine best discharge venue pending availability of family support. Will follow.     Follow Up Recommendations  Home health OT;Supervision/Assistance - 24 hour(pending progress as pt lives alone)    Equipment Recommendations  3 in 1 bedside commode            Precautions / Restrictions Precautions Precautions: Fall Restrictions Weight Bearing Restrictions: No      Mobility Bed Mobility Overal bed mobility: Needs Assistance Bed Mobility: Supine to Sit     Supine to sit: Supervision     General bed mobility comments: supervision for  lines/safety; pt seated EOB end of session  Transfers Overall transfer level: Needs assistance Equipment used: None Transfers: Sit to/from [employer] Transfers Sit to Stand: Supervision Stand pivot transfers: Supervision;Min assist       General transfer comment: minA post toileting as pt with LOB requiring assist to steady    Balance Overall balance assessment: Needs assistance Sitting-balance support: Feet supported Sitting balance-Leahy Scale: Good     Standing balance support: Bilateral upper extremity supported Standing balance-Leahy Scale: Fair                             ADL either performed or assessed with clinical judgement   ADL Overall ADL's : Needs assistance/impaired Eating/Feeding: Modified independent;Sitting   Grooming: Wash/dry hands;Min guard;Standing   Upper Body Bathing: Set up;Supervision/ safety;Sitting   Lower Body Bathing: Minimal assistance;Sit to/from stand   Upper Body Dressing : Set up;Min guard;Sitting   Lower Body Dressing: Minimal assistance;Sit to/from stand   Toilet Transfer: Stand-pivot;BSC;Minimal assistance;Supervision/safety Toilet Transfer Details (indicate cue type and reason): supervision initially but pt with LOB after pericare requiring minA to recover Toileting- Water quality scientist and Hygiene: Min guard;Sit to/from stand;Sitting/lateral lean Toileting - Clothing Manipulation Details (indicate cue type and reason): pt performing pericare in sitting and standing     Functional mobility during ADLs: Min guard;Minimal assistance General ADL Comments: pt with decreased activity tolerance, weakness     Vision         Perception     Praxis      Pertinent Vitals/Pain Pain Assessment: Faces Faces Pain Scale: Hurts  little more Pain Location: generalized/chest with coughing Pain Descriptors / Indicators: Discomfort Pain Intervention(s): Limited activity within patient's tolerance;Monitored during  session     Hand Dominance     Extremity/Trunk Assessment Upper Extremity Assessment Upper Extremity Assessment: Generalized weakness   Lower Extremity Assessment Lower Extremity Assessment: Defer to PT evaluation       Communication Communication Communication: No difficulties   Cognition Arousal/Alertness: Awake/alert Behavior During Therapy: WFL for tasks assessed/performed Overall Cognitive Status: Within Functional Limits for tasks assessed                                     General Comments       Exercises     Shoulder Instructions      Home Living Family/patient expects to be discharged to:: Private residence Living Arrangements: Alone Available Help at Discharge: Family;Available PRN/intermittently Type of Home: Apartment       Home Layout: One level     Bathroom Shower/Tub: Teacher, early years/pre: Handicapped height     Home Equipment: Grab bars - tub/shower          Prior Functioning/Environment Level of Independence: Independent        Comments: driving, 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, sports at a nursing facility         OT Problem List: Decreased strength;Decreased activity tolerance;Impaired balance (sitting and/or standing);Obesity;Cardiopulmonary status limiting activity      OT Treatment/Interventions: Self-care/ADL training;Therapeutic exercise;Energy conservation;DME and/or AE instruction;Therapeutic activities;Patient/family education;Balance training    OT Goals(Current goals can be found in the care plan section) Acute Rehab OT Goals Patient Stated Goal: get better  OT Goal Formulation: With patient Time For Goal Achievement: 06/04/19 Potential to Achieve Goals: Good  OT Frequency: Min 3X/week   Barriers to D/C:            Co-evaluation              AM-PAC OT "6 Clicks" Daily Activity     Outcome Measure Help from another person eating meals?: None Help from another person taking care of  personal grooming?: A Little Help from another person toileting, which includes using toliet, bedpan, or urinal?: A Little Help from another person bathing (including washing, rinsing, drying)?: A Little Help from another person to put on and taking off regular upper body clothing?: None Help from another person to put on and taking off regular lower body clothing?: A Little 6 Click Score: 20   End of Session Equipment Utilized During Treatment: Oxygen Nurse Communication: Mobility status  Activity Tolerance: Patient tolerated treatment well;Patient limited by fatigue Patient left: with call bell/phone within reach(seated EOB)  OT Visit Diagnosis: Muscle weakness (generalized) (M62.81);Unsteadiness on feet (R26.81)(decreased activity tolerance)                Time: 1539-1606 OT Time Calculation (min): 27 min Charges:  OT General Charges $OT Visit: 1 Visit OT Evaluation $OT Eval Moderate Complexity: 1 Mod OT Treatments $Self Care/Home Management : 8-22 mins  Lou Cal, OT Supplemental Rehabilitation Services Pager [phone] Office [place] 05/21/2019, 5:10 PM

## 2019-05-21 NOTE — Evaluation (Signed)
Physical Therapy Evaluation Patient Details Name: Teresa [name] MRN: [id number] DOB: 07-20-1946 Today's Date: 05/21/2019   History of Present Illness  73 y.o. female with medical history significant for HTN, DM, CHF with preserved EF in 2016, and CAD, DOE, edema, heart murmur, orthopnea,sleep apnea, vertigo , presented to the ED w/ nausea, vomiting, diarrhea, general malaise, fevers, cough, and shortness of breath.  She had been diagnosed with COVID-19 on 05/12/2019. She has since gone on to develop cough, shortness of breath, generalized abdominal discomfort, nausea, and nonbloody vomiting.  Clinical Impression   Pt admitted with above diagnosis. PTA states was I, but since has been having pain in chest w/ deep breathing and limitations in functional mob sec to strength, activity tolerance. Pt currently with functional limitations due to the deficits listed below (see PT Problem List). Pt remained on 3L/min via [place] and measured on pedi finger probe, during ambulation desat to min 85% but w/ seated rest was able to recover within a minute to low 90s. Pt will benefit from skilled PT to increase her overall strength, activity tolerance, independence and safety with mobility to allow discharge to the venue listed below.       Follow Up Recommendations Home health PT    Equipment Recommendations       Recommendations for Other Services       Precautions / Restrictions Precautions Precautions: Fall Restrictions Weight Bearing Restrictions: No      Mobility  Bed Mobility Overal bed mobility: Needs Assistance Bed Mobility: Supine to Sit;Sit to Supine     Supine to sit: Supervision Sit to supine: Supervision      Transfers Overall transfer level: Needs assistance Equipment used: Rolling walker (2 wheeled) Transfers: Sit to/from [facility] Sit to Stand: Supervision Stand pivot transfers: Supervision          Ambulation/Gait Ambulation/Gait assistance: Min  guard Gait Distance (Feet): 60 Feet Assistive device: Rolling walker (2 wheeled) Gait Pattern/deviations: Antalgic;Staggering right        Stairs            Wheelchair Mobility    Modified Rankin (Stroke Patients Only)       Balance Overall balance assessment: Needs assistance Sitting-balance support: Feet supported Sitting balance-Leahy Scale: Good     Standing balance support: Bilateral upper extremity supported Standing balance-Leahy Scale: Fair                               Pertinent Vitals/Pain Pain Assessment: 0-10 Pain Score: 9  Pain Location: in chest with taking deep breath Pain Descriptors / Indicators: Burning Pain Intervention(s): Limited activity within patient's tolerance;Monitored during session;Other (comment)(notified nurse)    Home Living Family/patient expects to be discharged to:: Private residence Living Arrangements: Alone                    Prior Function                 Hand Dominance        Extremity/Trunk Assessment   Upper Extremity Assessment Upper Extremity Assessment: Overall WFL for tasks assessed    Lower Extremity Assessment Lower Extremity Assessment: Generalized weakness       Communication   Communication: No difficulties  Cognition Arousal/Alertness: Awake/alert Behavior During Therapy: WFL for tasks assessed/performed Overall Cognitive Status: Within Functional Limits for tasks assessed  General Comments General comments (skin integrity, edema, etc.): attempted to see pt this am and pt c/o coughing up bloody speutum, nurse was notified and pt requested therapist return at a later time. Arrived at this time to find pt sitting EOB, states she is not feeling much better but is willing to attempt some mobility. Pt is at SBA level with bed mob and transfers and around room but with ambulation into hall needs CGA and RW.  Pt remained on  3L/min via [place] and sats ranged from high 90s to 85%. noted while ambulating pt was breathing quite hard and states she is unable to complete pursed lip breathing as her nose is stuffy. Pt decided on distance she could tolerate and returned to room, once seated and at rest sats increased to low 90s again, measured on pedi finger probe.    Exercises     Assessment/Plan    PT Assessment Patient needs continued PT services(while in hospital)  PT Problem List Decreased strength;Decreased activity tolerance;Decreased balance;Decreased mobility;Decreased coordination;Decreased safety awareness       PT Treatment Interventions Gait training;Functional mobility training;Therapeutic activities;Therapeutic exercise;Neuromuscular re-education;Patient/family education    PT Goals (Current goals can be found in the Care Plan section)  Acute Rehab PT Goals Patient Stated Goal: get better  PT Goal Formulation: With patient Time For Goal Achievement: 06/04/19 Potential to Achieve Goals: Good    Frequency Min 3X/week   Barriers to discharge   availability of caregivers    Co-evaluation               AM-PAC PT "6 Clicks" Mobility  Outcome Measure Help needed turning from your back to your side while in a flat bed without using bedrails?: A Little Help needed moving from lying on your back to sitting on the side of a flat bed without using bedrails?: A Little Help needed moving to and from a bed to a chair (including a wheelchair)?: A Little Help needed standing up from a chair using your arms (e.g., wheelchair or bedside chair)?: A Little Help needed to walk in hospital room?: A Little Help needed climbing 3-5 steps with a railing? : A Lot 6 Click Score: 17    End of Session Equipment Utilized During Treatment: Oxygen Activity Tolerance: Patient limited by pain;Treatment limited secondary to medical complications (Comment) Patient left: in chair;with call bell/phone within reach;with  nursing/sitter in room Nurse Communication: Mobility status;Other (comment)(c/o pain in chest) PT Visit Diagnosis: Other abnormalities of gait and mobility (R26.89);Muscle weakness (generalized) (M62.81);Pain Pain - part of body: (chest w/ deep breathing)    Time: [id number] PT Time Calculation (min) (ACUTE ONLY): 18 min   Charges:   PT Evaluation $PT Eval Moderate Complexity: [place], PT    Delford Field 05/21/2019, 12:54 PM

## 2019-05-21 NOTE — Plan of Care (Addendum)
Dr. Ree Kida about [facility]. For lunch. Ordered to give max-sliding and base (35U total).  Will continue to monitor.  Dr. Ree Kida of [facility] for dinner.  Increased base insulin to 18U and max sliding 20U - Ordered to give 38 ttl units.

## 2019-05-21 NOTE — Progress Notes (Signed)
PROGRESS NOTE  Teresa [name]  [id number] DOB: 1946-03-20 DOA: 05/16/2019 PCP: [name], MD   Brief Narrative: Teresa [name] is a 73 y.o. female with a history of chronic HFpEF, CAD, T2DM, and HTN who presented to the ED 10/10 with multiple complaints including GI (N/V/D), pulmonary (shortness of breath, cough), and constitutional (malaise, fatigue, generalized weakness) that began 10/3 found to have covid-19 on 10/5. She required supplemental oxygen at 3LPM and had bilateral CXR infiltrates, so she was admitted, started on steroids and remdesivir.  Assessment & Plan: Principal Problem:   Intractable nausea and vomiting Active Problems:   Hypothyroidism   Diabetes mellitus type II, non insulin dependent (HCC)   Essential hypertension   CAD (coronary artery disease)   Pneumonia due to COVID-19 virus   Chronic diastolic CHF (congestive heart failure) (HCC)  Acute hypoxic respiratory failure due to covid-19 pneumonia:  - Wean oxygen as able. Remains on 3L O2. Will need to be at/below 2L O2 with exertion prior to discharge.  - Continue airborne, contact precautions. PPE including surgical gown, gloves, cap, shoe covers, and CAPR used during this encounter in a negative pressure room.  - Check daily labs: CBC w/diff, CMP, d-dimer, ferritin, CRP  - Maintain euvolemia/net negative.  - Avoid NSAIDs - Recommend proning and aggressive use of incentive spirometry. - Pt is Jehovah's Witness and declines CCP.  - Due to receive 5th of 5 doses of remdesivir this evening. CRP declining as expected.  - Continue steroids  Covid-19 gastroenteritis: Improving clinically.  - Symptomatic management  Nasal congestion:  - Add short course of afrin to nasal saline.  Chronic HFpEF, HTN:  - Daily weights, strict I/O. Incomplete output documented.  - Continue diuresis with lasix 80mg  IV BID - Continue anti-hypertensives  T2DM uncontrolled with hyperglycemia. On 3 oral medications as  outpatient, HbA1c 7.5%. Severe steroid-induced hyperglycemia while admitted.  - Augment insulin to 15u TIDWC with resistant SSI and HS coverage. Continue lantus 22u BID.   Hypokalemia: Mild, improved. - Monitor, supplement with ongoing loop diuretic use.   Hypomagnesemia:  - Supplement and recheck  Hypothyroidism:  - Continue synthroid  Obesity: BMI 35.  - Weight loss recommended over long term.   DVT prophylaxis: Lovenox Code Status: Full Family Communication: Will speak with son by phone later today. Disposition Plan: Anticipate DC home, pending PT/OT evaluations. Remains on 3L O2. Will need to be at/below 2L O2 with exertion prior to discharge.   Consultants:   None  Procedures:   RIC RUE placed 10/13  Antimicrobials:  Remdesivir 10/10 - 10/14   Subjective: Having a worse morning thus far with global headache, pain all over. Continue to have poor appetite but does not report vomiting, though has had dry heaving previously. Does not report further diarrhea. Shortness of breath is moderate at rest and associated with a cough. Nasal congestion is worsening with dried blood expressed intermittently.   Objective: Vitals:   05/20/19 2010 05/21/19 0006 05/21/19 0500 05/21/19 0757  BP:    (!) 150/76  Pulse:    63  Resp:    17  Temp: 97.7 F (36.5 C) 98.1 F (36.7 C)  97.7 F (36.5 C)  TempSrc: Oral Oral  Oral  SpO2:    92%  Weight:   87.8 kg   Height:        Intake/Output Summary (Last 24 hours) at 05/21/2019 1052 Last data filed at 05/21/2019 0533 Gross per 24 hour  Intake 2027.44 ml  Output 1500 ml  Net 527.44 ml   Filed Weights   05/17/19 0053 05/21/19 0500  Weight: 86.2 kg 87.8 kg    Gen: 73 y.o. female in no distress Pulm: Non-labored breathing 3L O2, tachypneic. Crackles bilaterally. Wheeze just prior to cough but not otherwise. CV: Regular rate and rhythm. No murmur, rub, or gallop. No JVD, 1+ pitting LE edema GI: Abdomen soft, non-tender,  non-distended, with normoactive bowel sounds. No organomegaly or masses felt. Ext: Warm, no deformities Skin: No rashes, lesions or ulcers Neuro: Alert and oriented. No focal neurological deficits. Psych: Judgement and insight appear normal. Mood & affect appropriate.   Data Reviewed: I have personally reviewed following labs and imaging studies  CBC: Recent Labs  Lab 05/16/19 1633 05/17/19 0305 05/18/19 0430 05/19/19 0135 05/20/19 0418  WBC 7.3 8.7 12.9* 10.7* 11.1*  NEUTROABS 5.3 7.5 11.2* 9.2* 9.1*  HGB 14.0 12.9 13.1 12.8 13.0  HCT 41.0 38.8 40.7 39.2 39.2  MCV 87.4 90.2 93.8 90.7 90.3  PLT 211 207 144* 250 [id number]   Basic Metabolic Panel: Recent Labs  Lab 05/17/19 0305 05/17/19 1640 05/18/19 0430 05/19/19 0135 05/20/19 0418 05/21/19 0310  NA 138  --  139 140 137 136  K 3.0*  --  3.7 3.5 4.0 3.5  CL 103  --  107 103 100 96*  CO2 21*  --  19* 25 23 29   GLUCOSE 244*  --  219* 261* 375* 301*  BUN 9  --  15 20 23 23   CREATININE 0.61  --  0.66 0.55 0.56 0.52  CALCIUM 8.0*  --  8.7* 8.7* 8.4* 8.0*  MG 1.0* 2.3 1.4* 1.7 1.3* 1.6*  PHOS 1.5*  --  2.5  --   --   --    GFR: Estimated Creatinine Clearance: 64.5 mL/min (by C-G formula based on SCr of 0.52 mg/dL). Liver Function Tests: Recent Labs  Lab 05/16/19 1633 05/17/19 0305 05/18/19 0430 05/19/19 0135 05/20/19 0418  AST 36 26 24 18 16   ALT 24 22 23 21 18   ALKPHOS 52 45 62 74 89  BILITOT 1.2 0.6 0.6 0.2* 0.5  PROT 7.1 6.8 6.6 6.8 6.5  ALBUMIN 3.4* 3.5 3.1* 3.0* 3.0*   No results for input(s): LIPASE, AMYLASE in the last 168 hours. No results for input(s): AMMONIA in the last 168 hours. Coagulation Profile: No results for input(s): INR, PROTIME in the last 168 hours. Cardiac Enzymes: No results for input(s): CKTOTAL, CKMB, CKMBINDEX, TROPONINI in the last 168 hours. BNP (last 3 results) No results for input(s): PROBNP in the last 8760 hours. HbA1C: No results for input(s): HGBA1C in the last 72  hours. CBG: Recent Labs  Lab 05/20/19 0759 05/20/19 1136 05/20/19 1751 05/20/19 2045 05/21/19 0757  GLUCAP 308* 382* 346* 362* 230*   Lipid Profile: No results for input(s): CHOL, HDL, LDLCALC, TRIG, CHOLHDL, LDLDIRECT in the last 72 hours. Thyroid Function Tests: No results for input(s): TSH, T4TOTAL, FREET4, T3FREE, THYROIDAB in the last 72 hours. Anemia Panel: Recent Labs    05/20/19 0418 05/21/19 0310  FERRITIN 263 274   Urine analysis:    Component Value Date/Time   COLORURINE Yellow 05/14/2014 0956   APPEARANCEUR Hazy 05/14/2014 0956   LABSPEC 1.028 05/14/2014 0956   PHURINE 5.0 05/14/2014 0956   GLUCOSEU Negative 05/14/2014 0956   HGBUR Negative 05/14/2014 0956   BILIRUBINUR Negative 12/08/2015 0959   BILIRUBINUR Negative 05/14/2014 0956   KETONESUR Trace 05/14/2014 0956   PROTEINUR Negative 12/08/2015 [id number]  PROTEINUR Negative 05/14/2014 0956   UROBILINOGEN 0.2 12/08/2015 0959   NITRITE Negative 12/08/2015 0959   NITRITE Negative 05/14/2014 0956   LEUKOCYTESUR Negative 12/08/2015 0959   LEUKOCYTESUR 3+ 05/14/2014 0956   Recent Results (from the past 240 hour(s))  Novel Coronavirus, NAA ([facility])     Status: Abnormal   Collection Time: 05/12/19  9:47 AM   Specimen: Nasopharyngeal(NP) swabs in vial transport medium   NASOPHARYNGE  TESTING  Result Value Ref Range Status   SARS-CoV-2, NAA Detected (A) Not Detected Final    Comment: This nucleic acid amplification test was developed and its performance characteristics determined by [employer]. Nucleic acid amplification tests include PCR and TMA. This test has not been FDA cleared or approved. This test has been authorized by FDA under an Emergency Use Authorization (EUA). This test is only authorized for the duration of time the declaration that circumstances exist justifying the authorization of the emergency use of in vitro diagnostic tests for detection of SARS-CoV-2 virus and/or diagnosis of  COVID-19 infection under section 564(b)(1) of the Act, 21 U.S.C. [id number]) (1), unless the authorization is terminated or revoked sooner. When diagnostic testing is negative, the possibility of a false negative result should be considered in the context of a patient's recent exposures and the presence of clinical signs and symptoms consistent with COVID-19. An individual without symptoms of COVID-19 and who is not shedding SARS-CoV-2 virus would  expect to have a negative (not detected) result in this assay.       Radiology Studies: [place] Ekg Site Rite  Result Date: 05/19/2019 If [facility] image not attached, placement could not be confirmed due to current cardiac rhythm.   Scheduled Meds:  allopurinol  100 mg Oral Daily   amLODipine  10 mg Oral Daily   atorvastatin  10 mg Oral q1800   benzonatate  100 mg Oral TID   Chlorhexidine Gluconate Cloth  6 each Topical Daily   dexamethasone (DECADRON) injection  6 mg Intravenous Q24H   enoxaparin (LOVENOX) injection  40 mg Subcutaneous QHS   famotidine  20 mg Oral BID   furosemide  80 mg Intravenous Q12H   gabapentin  400 mg Oral TID   insulin aspart  0-20 Units Subcutaneous TID WC   insulin aspart  0-5 Units Subcutaneous QHS   insulin aspart  15 Units Subcutaneous TID WC   insulin glargine  22 Units Subcutaneous BID   levothyroxine  88 mcg Oral QAC breakfast   linagliptin  5 mg Oral Daily   losartan  25 mg Oral Daily   pneumococcal 23 valent vaccine  0.5 mL Intramuscular Tomorrow-1000   sodium chloride flush  3 mL Intravenous Q12H   thiamine  100 mg Oral Daily   vitamin C  500 mg Oral Daily   zinc sulfate  220 mg Oral Daily   Continuous Infusions:  remdesivir 100 mg in NS 250 mL 100 mg (05/20/19 1747)     LOS: 5 days   Time spent: 35 minutes.  Patrecia Pour, MD [facility] [email or web address] Password [email or web address] 05/21/2019, 10:52 AM

## 2019-05-22 LAB — COMPREHENSIVE METABOLIC PANEL
ALT: 23 U/L (ref 0–44)
AST: 25 U/L (ref 15–41)
Albumin: 2.9 g/dL — ABNORMAL LOW (ref 3.5–5.0)
Alkaline Phosphatase: 77 U/L (ref 38–126)
Anion gap: 12 (ref 5–15)
BUN: 27 mg/dL — ABNORMAL HIGH (ref 8–23)
CO2: 28 mmol/L (ref 22–32)
Calcium: 8.4 mg/dL — ABNORMAL LOW (ref 8.9–10.3)
Chloride: 97 mmol/L — ABNORMAL LOW (ref 98–111)
Creatinine, Ser: 0.54 mg/dL (ref 0.44–1.00)
GFR calc Af Amer: >60 mL/min (ref >60)
GFR calc non Af Amer: >60 mL/min (ref >60)
Glucose, Bld: 191 mg/dL — ABNORMAL HIGH (ref 70–99)
Potassium: 3.7 mmol/L (ref 3.5–5.1)
Sodium: 137 mmol/L (ref 135–145)
Total Bilirubin: 0.3 mg/dL (ref 0.3–1.2)
Total Protein: 6.3 g/dL — ABNORMAL LOW (ref 6.5–8.1)

## 2019-05-22 LAB — GLUCOSE, CAPILLARY
Glucose-Capillary: 205 mg/dL — ABNORMAL HIGH (ref 70–99)
Glucose-Capillary: 500 mg/dL — ABNORMAL HIGH (ref 70–99)
Glucose-Capillary: 348 mg/dL — ABNORMAL HIGH (ref 70–99)

## 2019-05-22 LAB — MAGNESIUM: Magnesium: 1.7 mg/dL (ref 1.7–2.4)

## 2019-05-22 LAB — C-REACTIVE PROTEIN: CRP: 1.3 mg/dL — ABNORMAL HIGH (ref <1.0)

## 2019-05-22 MED ORDER — INSULIN GLARGINE 100 UNIT/ML ~~LOC~~ SOLN
35.0000 [IU] | Freq: Two times a day (BID) | SUBCUTANEOUS | Status: DC
  Administered 2019-05-22 (×2): 35 [IU] via SUBCUTANEOUS
  Filled 2019-05-22 (×3): qty 0.35

## 2019-05-22 MED ORDER — MAGNESIUM SULFATE 4 GM/100ML IV SOLN
4.0000 g | Freq: Once | INTRAVENOUS | Status: AC
  Administered 2019-05-22: 4 g via INTRAVENOUS
  Filled 2019-05-22: qty 100

## 2019-05-22 MED ORDER — INSULIN ASPART 100 UNIT/ML ~~LOC~~ SOLN
40.0000 [IU] | Freq: Once | SUBCUTANEOUS | Status: AC
  Administered 2019-05-22: 13:00:00 40 [IU] via SUBCUTANEOUS

## 2019-05-22 NOTE — Progress Notes (Signed)
Son updated about O2 needs. No questions at this time. No concerns at this moment. Will continue to update as needed.

## 2019-05-22 NOTE — Progress Notes (Signed)
PROGRESS NOTE  ELLIEMAE [name]  [id number] DOB: 1946-05-16 DOA: 05/16/2019 PCP: [name], MD   Brief Narrative: Teresa [name] is a 73 y.o. female with a history of chronic HFpEF, CAD, T2DM, and HTN who presented to the ED 10/10 with multiple complaints including GI (N/V/D), pulmonary (shortness of breath, cough), and constitutional (malaise, fatigue, generalized weakness) that began 10/3 found to have covid-19 on 10/5. She required supplemental oxygen at 3LPM and had bilateral CXR infiltrates, so she was admitted, started on steroids and remdesivir.  Assessment & Plan: Principal Problem:   Intractable nausea and vomiting Active Problems:   Hypothyroidism   Diabetes mellitus type II, non insulin dependent (HCC)   Essential hypertension   CAD (coronary artery disease)   Pneumonia due to COVID-19 virus   Chronic diastolic CHF (congestive heart failure) (HCC)  Acute hypoxic respiratory failure due to covid-19 pneumonia:  - Wean oxygen as able. Remains on 3L O2. Will need to be at/below 2L O2 with exertion prior to discharge.  - Continue airborne, contact precautions. PPE including surgical gown, gloves, cap, shoe covers, and CAPR used during this encounter in a negative pressure room.  - Check CMP, CBC, inflammatory markers. - Maintain euvolemia/net negative as below. - Avoid NSAIDs - Recommend proning and aggressive use of incentive spirometry. - Pt is Jehovah's Witness and declines CCP.  - Completed remdesivir 10/10 - 10/14 - Continue steroids - Check CBC in AM w/hemoptysis.   Covid-19 gastroenteritis: Improving clinically.  - Symptomatic management  Nasal congestion: Improving. - Added short course of afrin to nasal saline.  Chronic HFpEF, HTN:  - Daily weights, strict I/O. Incomplete output documented though overall volume status improving. Will continue IV lasix BID again today and monitor edema, hypoxia with consideration of returning to home dose in next  24-48 hrs.  - Continue anti-hypertensives - BNP in AM.  T2DM uncontrolled with hyperglycemia. On 3 oral medications as outpatient, HbA1c 7.5%. Severe steroid-induced hyperglycemia while admitted.  - Augmented insulin with only limited success. Will further increase basal insulin to 35u BID and mealtime insulin with resistant SSI and HS coverage.   Hypokalemia: Mild, improved. - Monitor, supplement again today with ongoing loop diuretic use.   Hypomagnesemia:  - Supplement again today, showing some evidence of improvement with constant repletion, and recheck  Hypothyroidism:  - Continue synthroid  Obesity: BMI 35.  - Weight loss recommended over long term.   DVT prophylaxis: Lovenox Code Status: Full Family Communication: Speaking with son by phone daily. Disposition Plan: Pt lives alone, remains very weak and was falling at home prior to contracting covid. With now suffering from prolonged recovery from covid and likely needing supplemental oxygen at discharge, I do not believe she would be safe to go home straight from hospitalization. CSW consulted to search for SNF placement. Pt and family agree.    Consultants:   None  Procedures:   RIC RUE placed 10/13  Antimicrobials:  Remdesivir 10/10 - 10/14   Subjective: Still severe constant cough productive of sputum and some blood. This is worsening global headache not improved with tylenol. No chest pain, still dyspneic with any exertion. Afrin seems to have helped congestion.  Objective: Vitals:   05/21/19 2132 05/22/19 0437 05/22/19 0500 05/22/19 0802  BP: 116/67 128/84  124/70  Pulse: (!) 54 61  (!) 57  Resp: 17 18  19   Temp: 98.1 F (36.7 C) 98.2 F (36.8 C)  (!) 97.5 F (36.4 C)  TempSrc:  Oral  SpO2: 95% 95%  95%  Weight:   87.1 kg   Height:        Intake/Output Summary (Last 24 hours) at 05/22/2019 1100 Last data filed at 05/22/2019 0400 Gross per 24 hour  Intake 1488.03 ml  Output 2 ml  Net 1486.03 ml    Filed Weights   05/17/19 0053 05/21/19 0500 05/22/19 0500  Weight: 86.2 kg 87.8 kg 87.1 kg   Gen: 73 y.o. female in no distress Pulm: Nonlabored, frequent coughing, tachypnea with 3-4LPM O2, crackles bilaterally without wheezes, improved aeration at bases. CV: Regular rate and rhythm. No murmur, rub, or gallop. No JVD, no dependent edema. GI: Abdomen soft, non-tender, non-distended, with normoactive bowel sounds.  Ext: Warm, no deformities, trace edema on legs.  Skin: No rashes, lesions or ulcers on visualized skin. Neuro: Alert and oriented. No focal neurological deficits. Psych: Judgement and insight appear fair. Mood euthymic & affect congruent. Behavior is appropriate.    Data Reviewed: I have personally reviewed following labs and imaging studies  CBC: Recent Labs  Lab 05/16/19 1633 05/17/19 0305 05/18/19 0430 05/19/19 0135 05/20/19 0418  WBC 7.3 8.7 12.9* 10.7* 11.1*  NEUTROABS 5.3 7.5 11.2* 9.2* 9.1*  HGB 14.0 12.9 13.1 12.8 13.0  HCT 41.0 38.8 40.7 39.2 39.2  MCV 87.4 90.2 93.8 90.7 90.3  PLT 211 207 144* 250 [id number]   Basic Metabolic Panel: Recent Labs  Lab 05/17/19 0305  05/18/19 0430 05/19/19 0135 05/20/19 0418 05/21/19 0310 05/22/19 0440  NA 138  --  139 140 137 136 137  K 3.0*  --  3.7 3.5 4.0 3.5 3.7  CL 103  --  107 103 100 96* 97*  CO2 21*  --  19* 25 23 29 28   GLUCOSE 244*  --  219* 261* 375* 301* 191*  BUN 9  --  15 20 23 23  27*  CREATININE 0.61  --  0.66 0.55 0.56 0.52 0.54  CALCIUM 8.0*  --  8.7* 8.7* 8.4* 8.0* 8.4*  MG 1.0*   < > 1.4* 1.7 1.3* 1.6* 1.7  PHOS 1.5*  --  2.5  --   --   --   --    < > = values in this interval not displayed.   GFR: Estimated Creatinine Clearance: 64.2 mL/min (by C-G formula based on SCr of 0.54 mg/dL). Liver Function Tests: Recent Labs  Lab 05/17/19 0305 05/18/19 0430 05/19/19 0135 05/20/19 0418 05/22/19 0440  AST 26 24 18 16 25   ALT 22 23 21 18 23   ALKPHOS 45 62 74 89 77  BILITOT 0.6 0.6 0.2* 0.5 0.3   PROT 6.8 6.6 6.8 6.5 6.3*  ALBUMIN 3.5 3.1* 3.0* 3.0* 2.9*   CBG: Recent Labs  Lab 05/21/19 0757 05/21/19 1209 05/21/19 1701 05/21/19 2019 05/22/19 0742  GLUCAP 230* 403* 404* 412* 205*   Lipid Profile: No results for input(s): CHOL, HDL, LDLCALC, TRIG, CHOLHDL, LDLDIRECT in the last 72 hours. Thyroid Function Tests: No results for input(s): TSH, T4TOTAL, FREET4, T3FREE, THYROIDAB in the last 72 hours. Anemia Panel: Recent Labs    05/20/19 0418 05/21/19 0310  FERRITIN 263 274   Urine analysis:    Component Value Date/Time   COLORURINE Yellow 05/14/2014 0956   APPEARANCEUR Hazy 05/14/2014 0956   LABSPEC 1.028 05/14/2014 0956   PHURINE 5.0 05/14/2014 0956   GLUCOSEU Negative 05/14/2014 0956   HGBUR Negative 05/14/2014 0956   BILIRUBINUR Negative 12/08/2015 0959   BILIRUBINUR Negative 05/14/2014 [id number]  KETONESUR Trace 05/14/2014 0956   PROTEINUR Negative 12/08/2015 0959   PROTEINUR Negative 05/14/2014 0956   UROBILINOGEN 0.2 12/08/2015 0959   NITRITE Negative 12/08/2015 0959   NITRITE Negative 05/14/2014 0956   LEUKOCYTESUR Negative 12/08/2015 0959   LEUKOCYTESUR 3+ 05/14/2014 0956   No results found for this or any previous visit (from the past 240 hour(s)).    Radiology Studies: No results found.  Scheduled Meds: . allopurinol  100 mg Oral Daily  . amLODipine  10 mg Oral Daily  . atorvastatin  10 mg Oral q1800  . benzonatate  100 mg Oral TID  . Chlorhexidine Gluconate Cloth  6 each Topical Daily  . dexamethasone (DECADRON) injection  6 mg Intravenous Q24H  . enoxaparin (LOVENOX) injection  40 mg Subcutaneous QHS  . famotidine  20 mg Oral BID  . furosemide  80 mg Intravenous Q12H  . gabapentin  400 mg Oral TID  . insulin aspart  0-20 Units Subcutaneous TID WC  . insulin aspart  0-5 Units Subcutaneous QHS  . insulin aspart  18 Units Subcutaneous TID WC  . insulin glargine  35 Units Subcutaneous BID  . levothyroxine  88 mcg Oral QAC breakfast  .  linagliptin  5 mg Oral Daily  . losartan  25 mg Oral Daily  . oxymetazoline  1 spray Each Nare BID  . pneumococcal 23 valent vaccine  0.5 mL Intramuscular Tomorrow-1000  . sodium chloride flush  3 mL Intravenous Q12H  . thiamine  100 mg Oral Daily  . vitamin C  500 mg Oral Daily  . zinc sulfate  220 mg Oral Daily   Continuous Infusions: . magnesium sulfate bolus IVPB       LOS: 6 days   Time spent: 35 minutes.  Patrecia Pour, MD [facility] [email or web address] Password [email or web address] 05/22/2019, 11:00 AM

## 2019-05-22 NOTE — TOC Progression Note (Signed)
Transition of Care [facility]) - Progression Note    Patient Details  Name: Teresa [name] MRN: [id number] Date of Birth: [birth year]  [facility] of [facility]) CM/SW Contact  Teresa [name] Beverely Pace, RN Phone Number: 05/22/2019, 1:52 PM  Clinical Narrative:   Patientis a 73 y.o.femalewith medical history significant forhypertension, diabetes, CHF and coronary artery disease,  department for evaluation of nausea, vomiting, diarrhea, general malaise, fevers, cough, and shortness of breath.She had been diagnosed with COVID-19 on 05/12/2019. Patient is from home alone. Currently on oxygen at 2L [place], Remdesivir. Case manager spoke with patient's son, Teresa [name] concerning discharge needs.Choice for [facility] agency was offered and Teresa [name] was selected. Teresa [name] then informed Case Manager that he and his family are not able to assist his mom at this time. He and his daughter are COVID positive and everyone in the home is quarantined. He states his mom will need to go to rehab for shorttime because she does not have support presently. Case manager updated MD. Will continue to monitor.       Expected Discharge Plan and Services                                                 Social Determinants of Health (SDOH) Interventions    Readmission Risk Interventions No flowsheet data found.

## 2019-05-23 LAB — GLUCOSE, CAPILLARY
Glucose-Capillary: 378 mg/dL — ABNORMAL HIGH (ref 70–99)
Glucose-Capillary: 248 mg/dL — ABNORMAL HIGH (ref 70–99)
Glucose-Capillary: 374 mg/dL — ABNORMAL HIGH (ref 70–99)
Glucose-Capillary: 436 mg/dL — ABNORMAL HIGH (ref 70–99)
Glucose-Capillary: 217 mg/dL — ABNORMAL HIGH (ref 70–99)

## 2019-05-23 LAB — C-REACTIVE PROTEIN: CRP: 0.9 mg/dL (ref <1.0)

## 2019-05-23 LAB — COMPREHENSIVE METABOLIC PANEL
ALT: 24 U/L (ref 0–44)
AST: 24 U/L (ref 15–41)
Albumin: 2.9 g/dL — ABNORMAL LOW (ref 3.5–5.0)
Alkaline Phosphatase: 80 U/L (ref 38–126)
Anion gap: 11 (ref 5–15)
BUN: 29 mg/dL — ABNORMAL HIGH (ref 8–23)
CO2: 30 mmol/L (ref 22–32)
Calcium: 8.6 mg/dL — ABNORMAL LOW (ref 8.9–10.3)
Chloride: 95 mmol/L — ABNORMAL LOW (ref 98–111)
Creatinine, Ser: 0.57 mg/dL (ref 0.44–1.00)
GFR calc Af Amer: >60 mL/min (ref >60)
GFR calc non Af Amer: >60 mL/min (ref >60)
Glucose, Bld: 218 mg/dL — ABNORMAL HIGH (ref 70–99)
Potassium: 3.8 mmol/L (ref 3.5–5.1)
Sodium: 136 mmol/L (ref 135–145)
Total Bilirubin: 0.4 mg/dL (ref 0.3–1.2)
Total Protein: 6.2 g/dL — ABNORMAL LOW (ref 6.5–8.1)

## 2019-05-23 LAB — BRAIN NATRIURETIC PEPTIDE: B Natriuretic Peptide: 92.9 pg/mL (ref 0.0–100.0)

## 2019-05-23 MED ORDER — INSULIN GLARGINE 100 UNIT/ML ~~LOC~~ SOLN
40.0000 [IU] | Freq: Two times a day (BID) | SUBCUTANEOUS | Status: DC
  Administered 2019-05-23 (×2): 40 [IU] via SUBCUTANEOUS
  Filled 2019-05-23 (×3): qty 0.4

## 2019-05-23 MED ORDER — INSULIN ASPART 100 UNIT/ML ~~LOC~~ SOLN
25.0000 [IU] | Freq: Three times a day (TID) | SUBCUTANEOUS | Status: DC
  Administered 2019-05-23 (×3): 25 [IU] via SUBCUTANEOUS

## 2019-05-23 MED ORDER — BENZONATATE 100 MG PO CAPS
200.0000 mg | ORAL_CAPSULE | Freq: Three times a day (TID) | ORAL | Status: DC
  Administered 2019-05-23 – 2019-05-27 (×13): 200 mg via ORAL
  Filled 2019-05-23 (×13): qty 2

## 2019-05-23 MED ORDER — WITCH HAZEL-GLYCERIN EX PADS
MEDICATED_PAD | CUTANEOUS | Status: DC | PRN
  Administered 2019-05-23: 16:00:00 via TOPICAL
  Filled 2019-05-23: qty 100

## 2019-05-23 MED ORDER — BUTALBITAL-APAP-CAFFEINE 50-325-40 MG PO TABS
1.0000 | ORAL_TABLET | ORAL | Status: DC | PRN
  Administered 2019-05-23 – 2019-05-25 (×2): 1 via ORAL
  Filled 2019-05-23 (×5): qty 1

## 2019-05-23 NOTE — Progress Notes (Signed)
PROGRESS NOTE  Teresa [name]  [id number] DOB: 09-15-1945 DOA: 05/16/2019 PCP: [name], MD   Brief Narrative: Teresa [name] is a 73 y.o. female with a history of chronic HFpEF, CAD, T2DM, and HTN who presented to the ED 10/10 with multiple complaints including GI (N/V/D), pulmonary (shortness of breath, cough), and constitutional (malaise, fatigue, generalized weakness) that began 10/3 found to have covid-19 on 10/5. She required supplemental oxygen at 3LPM and had bilateral CXR infiltrates, so she was admitted, started on steroids and remdesivir.  Assessment & Plan: Principal Problem:   Intractable nausea and vomiting Active Problems:   Hypothyroidism   Diabetes mellitus type II, non insulin dependent (HCC)   Essential hypertension   CAD (coronary artery disease)   Pneumonia due to COVID-19 virus   Chronic diastolic CHF (congestive heart failure) (HCC)  Acute hypoxic respiratory failure due to covid-19 pneumonia:  - Wean oxygen as able. Remains on 3-4L O2. Will need to be at/below 2L O2 with exertion prior to discharge.  - Continue airborne, contact precautions. PPE including surgical gown, gloves, cap, shoe covers, and CAPR used during this encounter in a negative pressure room.  - Check CMP, CBC. CRP has normalized. Hypoxia persists, will continue decadron. - Maintain euvolemia/net negative as below. - Avoid NSAIDs - Recommend proning and aggressive use of incentive spirometry. - Pt is Jehovah's Witness and declined CCP.  - Completed remdesivir 10/10 - 10/14 - Check CBC in AM w/hemoptysis which has improved.  Covid-19 gastroenteritis: Improving clinically.  - Symptomatic management  Nasal congestion: Improving. - Continue short course of afrin to nasal saline.  Chronic HFpEF, HTN:  - Daily weights, strict I/O. Will continue IV lasix BID again today and monitor edema, hypoxia. - Continue anti-hypertensives - BNP 92.9, consistent with exam showing euvolemia.  BUN:Cr very wide but CrCl stable.  T2DM uncontrolled with hyperglycemia. On 3 oral medications as outpatient, HbA1c 7.5%. Severe steroid-induced hyperglycemia while admitted.  - Augmented insulin with only limited success, CBG reached 500 yesterday. Will further augment today. Basal up to 40u BID, TIDWC novolog up to 25u with continued resistant SSI. Unable to wean steroid at this time.   Hypokalemia: Mild, improved. - Monitor with ongoing loop diuretic use.   Hypomagnesemia:  - Supplement again today, showing some evidence of improvement with constant repletion, and recheck  Hypothyroidism:  - Continue synthroid  Obesity: BMI 35.  - Weight loss recommended over long term.   Headache: Secondary to cough.  - Antitussives augmented, tessalon scheduled, prn robitussin.  - Fioricet prn. Note patient has many allergies to medications including analgesics.  DVT prophylaxis: Lovenox Code Status: Full Family Communication: Speaking with son by phone daily. Disposition Plan: Pt lives alone, remains very weak and was falling at home prior to contracting covid and now likely needing supplemental oxygen at discharge, I do not believe she would be safe to go home straight from hospitalization. PT/OT reconsulted. CSW consulted to search for SNF placement. Pt and family agree.    Consultants:   None  Procedures:   RIC RUE placed 10/13  Antimicrobials:  Remdesivir 10/10 - 10/14   Subjective: Global headache is stable, worse with cough which is persistent, severe, unchanged, with no mention of hemoptysis. No chest pain. Dyspnea is stable, any time O2 is weaned, SpO2 decreases into 70%'s.  Objective: Vitals:   05/22/19 1738 05/22/19 2035 05/23/19 0500 05/23/19 0508  BP: 125/66 133/67  105/64  Pulse: 69 68  61  Resp: (!) 25  20    Temp: 97.8 F (36.6 C) 97.9 F (36.6 C)  98.2 F (36.8 C)  TempSrc: Oral Oral    SpO2: 94% 94%  94%  Weight:   87.2 kg   Height:        Intake/Output  Summary (Last 24 hours) at 05/23/2019 0908 Last data filed at 05/23/2019 0500 Gross per 24 hour  Intake -  Output 350 ml  Net -350 ml   Filed Weights   05/21/19 0500 05/22/19 0500 05/23/19 0500  Weight: 87.8 kg 87.1 kg 87.2 kg   Gen: 73 y.o. female in no distress Pulm: Nonlabored breathing supplemental oxygen, cough with deep breaths, no wheezing. Crackles at bases improving. CV: Regular rate and rhythm. No murmur, rub, or gallop. No JVD, trace, minimal dependent edema. GI: Abdomen soft, non-tender, non-distended, with normoactive bowel sounds.  Ext: Warm, no deformities Skin: No new rashes, lesions or ulcers on visualized skin. Neuro: Alert and oriented. No focal neurological deficits. Psych: Judgement and insight appear fair. Mood euthymic & affect congruent. Behavior is appropriate.    Data Reviewed: I have personally reviewed following labs and imaging studies  CBC: Recent Labs  Lab 05/16/19 1633 05/17/19 0305 05/18/19 0430 05/19/19 0135 05/20/19 0418  WBC 7.3 8.7 12.9* 10.7* 11.1*  NEUTROABS 5.3 7.5 11.2* 9.2* 9.1*  HGB 14.0 12.9 13.1 12.8 13.0  HCT 41.0 38.8 40.7 39.2 39.2  MCV 87.4 90.2 93.8 90.7 90.3  PLT 211 207 144* 250 Q000111Q   Basic Metabolic Panel: Recent Labs  Lab 05/17/19 0305  05/18/19 0430 05/19/19 0135 05/20/19 0418 05/21/19 0310 05/22/19 0440 05/23/19 0523  NA 138  --  139 140 137 136 137 136  K 3.0*  --  3.7 3.5 4.0 3.5 3.7 3.8  CL 103  --  107 103 100 96* 97* 95*  CO2 21*  --  19* 25 23 29 28 30   GLUCOSE 244*  --  219* 261* 375* 301* 191* 218*  BUN 9  --  15 20 23 23  27* 29*  CREATININE 0.61  --  0.66 0.55 0.56 0.52 0.54 0.57  CALCIUM 8.0*  --  8.7* 8.7* 8.4* 8.0* 8.4* 8.6*  MG 1.0*   < > 1.4* 1.7 1.3* 1.6* 1.7  --   PHOS 1.5*  --  2.5  --   --   --   --   --    < > = values in this interval not displayed.   GFR: Estimated Creatinine Clearance: 64.2 mL/min (by C-G formula based on SCr of 0.57 mg/dL). Liver Function Tests: Recent Labs   Lab 05/18/19 0430 05/19/19 0135 05/20/19 0418 05/22/19 0440 05/23/19 0523  AST 24 18 16 25 24   ALT 23 21 18 23 24   ALKPHOS 62 74 89 77 80  BILITOT 0.6 0.2* 0.5 0.3 0.4  PROT 6.6 6.8 6.5 6.3* 6.2*  ALBUMIN 3.1* 3.0* 3.0* 2.9* 2.9*   CBG: Recent Labs  Lab 05/21/19 2019 05/22/19 0742 05/22/19 1107 05/22/19 1647 05/23/19 0834  GLUCAP 412* 205* 500* 348* 248*   Lipid Profile: No results for input(s): CHOL, HDL, LDLCALC, TRIG, CHOLHDL, LDLDIRECT in the last 72 hours. Thyroid Function Tests: No results for input(s): TSH, T4TOTAL, FREET4, T3FREE, THYROIDAB in the last 72 hours. Anemia Panel: Recent Labs    05/21/19 0310  FERRITIN 274   Urine analysis:    Component Value Date/Time   COLORURINE Yellow 05/14/2014 0956   APPEARANCEUR Hazy 05/14/2014 0956   LABSPEC 1.028 05/14/2014 [id number]  PHURINE 5.0 05/14/2014 0956   GLUCOSEU Negative 05/14/2014 0956   HGBUR Negative 05/14/2014 0956   BILIRUBINUR Negative 12/08/2015 0959   BILIRUBINUR Negative 05/14/2014 0956   KETONESUR Trace 05/14/2014 0956   PROTEINUR Negative 12/08/2015 0959   PROTEINUR Negative 05/14/2014 0956   UROBILINOGEN 0.2 12/08/2015 0959   NITRITE Negative 12/08/2015 0959   NITRITE Negative 05/14/2014 0956   LEUKOCYTESUR Negative 12/08/2015 0959   LEUKOCYTESUR 3+ 05/14/2014 0956   No results found for this or any previous visit (from the past 240 hour(s)).    Radiology Studies: No results found.  Scheduled Meds: . allopurinol  100 mg Oral Daily  . amLODipine  10 mg Oral Daily  . atorvastatin  10 mg Oral q1800  . benzonatate  200 mg Oral TID  . Chlorhexidine Gluconate Cloth  6 each Topical Daily  . dexamethasone (DECADRON) injection  6 mg Intravenous Q24H  . enoxaparin (LOVENOX) injection  40 mg Subcutaneous QHS  . famotidine  20 mg Oral BID  . furosemide  80 mg Intravenous Q12H  . gabapentin  400 mg Oral TID  . insulin aspart  0-20 Units Subcutaneous TID WC  . insulin aspart  0-5 Units  Subcutaneous QHS  . insulin aspart  25 Units Subcutaneous TID WC  . insulin glargine  40 Units Subcutaneous BID  . levothyroxine  88 mcg Oral QAC breakfast  . linagliptin  5 mg Oral Daily  . losartan  25 mg Oral Daily  . oxymetazoline  1 spray Each Nare BID  . pneumococcal 23 valent vaccine  0.5 mL Intramuscular Tomorrow-1000  . sodium chloride flush  3 mL Intravenous Q12H  . thiamine  100 mg Oral Daily  . vitamin C  500 mg Oral Daily  . zinc sulfate  220 mg Oral Daily   Continuous Infusions:    LOS: 7 days   Time spent: 35 minutes.  Patrecia Pour, MD [facility] [email or web address] Password [email or web address] 05/23/2019, 9:08 AM

## 2019-05-23 NOTE — Progress Notes (Addendum)
Physical Therapy Treatment Patient Details Name: Teresa [name] MRN: [id number] DOB: [birth year] Today's Date: 05/23/2019    History of Present Illness 73 y.o. female with medical history significant for HTN, DM, CHF with preserved EF in 2016, and CAD, DOE, edema, heart murmur, orthopnea,sleep apnea, vertigo , presented to the ED w/ nausea, vomiting, diarrhea, general malaise, fevers, cough, and shortness of breath.  She had been diagnosed with COVID-19 on 05/12/2019. She has since gone on to develop cough, shortness of breath, generalized abdominal discomfort, nausea, and nonbloody vomiting.    PT Comments    Pt progressing with tx, she is able to tolerate more activity with slightly less assistance. Pt on #L/min via [place] and was able to ambuate approx 181ft with RW and min A, 1 LOB noted and was able to self correct. Desat to lower 80s with ambulation but able to quickly recover w/ seated rest break.     Follow Up Recommendations  SNF     Equipment Recommendations       Recommendations for Other Services       Precautions / Restrictions Precautions Precautions: Fall;Other (comment) Precaution Comments: desat with activity, RLE buckles Restrictions Weight Bearing Restrictions: No    Mobility  Bed Mobility               General bed mobility comments: Pt sitting in recliner at therapist arrival  Transfers Overall transfer level: Needs assistance Equipment used: Rolling walker (2 wheeled) Transfers: Sit to/from Stand Sit to Stand: Supervision            Ambulation/Gait Ambulation/Gait assistance: Min assist Gait Distance (Feet): 130 Feet Assistive device: Rolling walker (2 wheeled) Gait Pattern/deviations: Step-through pattern     General Gait Details: 1 lOB but was able to self correct states RLE was weak and trembling   Stairs             Wheelchair Mobility    Modified Rankin (Stroke Patients Only)       Balance Overall balance assessment:  Needs assistance Sitting-balance support: Feet supported Sitting balance-Leahy Scale: Good     Standing balance support: Bilateral upper extremity supported;During functional activity Standing balance-Leahy Scale: Fair Standing balance comment: 1 LOB iwth ambulation                            Cognition Arousal/Alertness: Awake/alert Behavior During Therapy: WFL for tasks assessed/performed Overall Cognitive Status: Within Functional Limits for tasks assessed                                        Exercises      General Comments General comments (skin integrity, edema, etc.): Pt states she feels better than previous, she also did much better with mobility and tolerated more than previous session. Pt on 3L/min via  noticed soem desat to low 80s with ambulation, was able to ambulate approx 122ft with RW and CGA-min A, 1 LOB states that RLE gave out was weak and trembling. Noted some coughing after ambulation but not productive at this time. with cues 02 sats increased to 90s within 1 min ute of sitting down and commencement of purse dlip breathing.      Pertinent Vitals/Pain Pain Assessment: 0-10 Pain Score: 4  Pain Location: w/ laboured breathing/coughing Pain Descriptors / Indicators: Discomfort Pain Intervention(s): Limited activity within patient's tolerance  Home Living                      Prior Function            PT Goals (current goals can now be found in the care plan section) Acute Rehab PT Goals Patient Stated Goal: get stronger PT Goal Formulation: With patient Time For Goal Achievement: 06/04/19 Potential to Achieve Goals: Good Progress towards PT goals: Progressing toward goals    Frequency    Min 3X/week      PT Plan Discharge plan needs to be updated    Co-evaluation              AM-PAC PT "6 Clicks" Mobility   Outcome Measure  Help needed turning from your back to your side while in a flat bed  without using bedrails?: A Little Help needed moving from lying on your back to sitting on the side of a flat bed without using bedrails?: A Little Help needed moving to and from a bed to a chair (including a wheelchair)?: A Little Help needed standing up from a chair using your arms (e.g., wheelchair or bedside chair)?: A Little Help needed to walk in hospital room?: A Little Help needed climbing 3-5 steps with a railing? : A Lot 6 Click Score: 17    End of Session Equipment Utilized During Treatment: Oxygen Activity Tolerance: Patient limited by lethargy;Treatment limited secondary to medical complications (Comment) Patient left: in chair;with call bell/phone within reach Nurse Communication: Mobility status PT Visit Diagnosis: Other abnormalities of gait and mobility (R26.89);Muscle weakness (generalized) (M62.81);Pain     Time: [id number] PT Time Calculation (min) (ACUTE ONLY): 23 min  Charges:  $Gait Training: 8-22 mins $Therapeutic Exercise: 8-22 mins                     Horald Chestnut, PT    Delford Field 05/23/2019, 2:08 PM

## 2019-05-24 LAB — CBC WITH DIFFERENTIAL/PLATELET
Abs Immature Granulocytes: 0.87 10*3/uL — ABNORMAL HIGH (ref 0.00–0.07)
Basophils Absolute: 0.1 10*3/uL (ref 0.0–0.1)
Basophils Relative: 1 %
Eosinophils Absolute: 0.1 10*3/uL (ref 0.0–0.5)
Eosinophils Relative: 0 %
HCT: 40 % (ref 36.0–46.0)
Hemoglobin: 13.2 g/dL (ref 12.0–15.0)
Immature Granulocytes: 6 %
Lymphocytes Relative: 16 %
Lymphs Abs: 2.3 10*3/uL (ref 0.7–4.0)
MCH: 29.3 pg (ref 26.0–34.0)
MCHC: 33 g/dL (ref 30.0–36.0)
MCV: 88.9 fL (ref 80.0–100.0)
Monocytes Absolute: 0.9 10*3/uL (ref 0.1–1.0)
Monocytes Relative: 6 %
Neutro Abs: 9.9 10*3/uL — ABNORMAL HIGH (ref 1.7–7.7)
Neutrophils Relative %: 71 %
Platelets: 308 10*3/uL (ref 150–400)
RBC: 4.5 MIL/uL (ref 3.87–5.11)
RDW: 14.1 % (ref 11.5–15.5)
WBC: 14.1 10*3/uL — ABNORMAL HIGH (ref 4.0–10.5)
nRBC: 0.1 % (ref 0.0–0.2)

## 2019-05-24 LAB — COMPREHENSIVE METABOLIC PANEL
ALT: 26 U/L (ref 0–44)
AST: 28 U/L (ref 15–41)
Albumin: 3 g/dL — ABNORMAL LOW (ref 3.5–5.0)
Alkaline Phosphatase: 67 U/L (ref 38–126)
Anion gap: 10 (ref 5–15)
BUN: 30 mg/dL — ABNORMAL HIGH (ref 8–23)
CO2: 33 mmol/L — ABNORMAL HIGH (ref 22–32)
Calcium: 8.7 mg/dL — ABNORMAL LOW (ref 8.9–10.3)
Chloride: 95 mmol/L — ABNORMAL LOW (ref 98–111)
Creatinine, Ser: 0.55 mg/dL (ref 0.44–1.00)
GFR calc Af Amer: >60 mL/min (ref >60)
GFR calc non Af Amer: >60 mL/min (ref >60)
Glucose, Bld: 113 mg/dL — ABNORMAL HIGH (ref 70–99)
Potassium: 3.8 mmol/L (ref 3.5–5.1)
Sodium: 138 mmol/L (ref 135–145)
Total Bilirubin: 0.6 mg/dL (ref 0.3–1.2)
Total Protein: 6.4 g/dL — ABNORMAL LOW (ref 6.5–8.1)

## 2019-05-24 LAB — GLUCOSE, CAPILLARY
Glucose-Capillary: 254 mg/dL — ABNORMAL HIGH (ref 70–99)
Glucose-Capillary: 417 mg/dL — ABNORMAL HIGH (ref 70–99)
Glucose-Capillary: 181 mg/dL — ABNORMAL HIGH (ref 70–99)
Glucose-Capillary: 190 mg/dL — ABNORMAL HIGH (ref 70–99)
Glucose-Capillary: 265 mg/dL — ABNORMAL HIGH (ref 70–99)

## 2019-05-24 MED ORDER — INSULIN GLARGINE 100 UNIT/ML ~~LOC~~ SOLN
45.0000 [IU] | Freq: Two times a day (BID) | SUBCUTANEOUS | Status: DC
  Administered 2019-05-24 – 2019-05-26 (×5): 45 [IU] via SUBCUTANEOUS
  Filled 2019-05-24 (×7): qty 0.45

## 2019-05-24 MED ORDER — INSULIN ASPART 100 UNIT/ML ~~LOC~~ SOLN: 30.0000 [IU] | Freq: Three times a day (TID) | SUBCUTANEOUS | Status: DC

## 2019-05-24 MED ORDER — MENTHOL 3 MG MT LOZG
1.0000 | LOZENGE | OROMUCOSAL | Status: DC | PRN
  Administered 2019-05-24: 3 mg via ORAL
  Filled 2019-05-24: qty 9

## 2019-05-24 MED ORDER — POTASSIUM CHLORIDE CRYS ER 20 MEQ PO TBCR
40.0000 meq | EXTENDED_RELEASE_TABLET | Freq: Once | ORAL | Status: AC
  Administered 2019-05-24: 09:00:00 40 meq via ORAL
  Filled 2019-05-24: qty 2

## 2019-05-24 MED ORDER — INSULIN ASPART 100 UNIT/ML ~~LOC~~ SOLN
35.0000 [IU] | Freq: Three times a day (TID) | SUBCUTANEOUS | Status: DC
  Administered 2019-05-24 (×3): 35 [IU] via SUBCUTANEOUS

## 2019-05-24 MED ORDER — FLUCONAZOLE 100 MG PO TABS
150.0000 mg | ORAL_TABLET | Freq: Once | ORAL | Status: AC
  Administered 2019-05-24: 12:00:00 150 mg via ORAL
  Filled 2019-05-24: qty 1.5

## 2019-05-24 MED ORDER — NYSTATIN 100000 UNIT/GM EX POWD
Freq: Three times a day (TID) | CUTANEOUS | Status: DC
  Administered 2019-05-24 – 2019-05-27 (×10): via TOPICAL
  Filled 2019-05-24: qty 15

## 2019-05-24 NOTE — Progress Notes (Signed)
Called to speak with pt's son, Ron. Updated on plan of care. Son States that he "is positive, but I haven't felt bad at all". Denies further questions.

## 2019-05-24 NOTE — Progress Notes (Addendum)
PROGRESS NOTE  Teresa [name]  [id number] DOB: 1945-12-28 DOA: 05/16/2019 PCP: [name], MD   Brief Narrative: Teresa [name] is a 73 y.o. female with a history of chronic HFpEF, CAD, T2DM, and HTN who presented to the ED 10/10 with multiple complaints including GI (N/V/D), pulmonary (shortness of breath, cough), and constitutional (malaise, fatigue, generalized weakness) that began 10/3 found to have covid-19 on 10/5. She required supplemental oxygen at 3LPM and had bilateral CXR infiltrates, so she was admitted, started on steroids and remdesivir.  Assessment & Plan: Principal Problem:   Intractable nausea and vomiting Active Problems:   Hypothyroidism   Diabetes mellitus type II, non insulin dependent (HCC)   Essential hypertension   CAD (coronary artery disease)   Pneumonia due to COVID-19 virus   Chronic diastolic CHF (congestive heart failure) (HCC)  Acute hypoxic respiratory failure due to covid-19 pneumonia:  - Wean oxygen as able. Remains on 3-4L O2. Will need to be at/below 2L O2 with exertion prior to discharge.  - Continue airborne, contact precautions. PPE including surgical gown, gloves, cap, shoe covers, and CAPR used during this encounter in a negative pressure room.  - Check CMP, CBC. CRP has normalized. Hypoxia persists, will continue decadron, plan taper after 10 days. - Maintain euvolemia/net negative as below. - Avoid NSAIDs - Recommend proning and aggressive use of incentive spirometry. - Pt is Jehovah's Witness and declined CCP.  - Completed remdesivir 10/10 - 10/14 - Regarding hemoptysis, this is stable, no anemia. Consider bronchoscopy if this persists after resolution of hypoxia.  Covid-19 gastroenteritis: Improving clinically.  - Symptomatic management  Nasal congestion: Improving. - Continue short course of afrin to nasal saline.  Chronic HFpEF, HTN:  - Daily weights, strict I/O. Will continue IV lasix BID again today given edema,  hypoxia. - Continue anti-hypertensives - BNP 92.9, consistent with exam showing euvolemia. BUN:Cr very wide and widening but CrCl stable.  T2DM uncontrolled with hyperglycemia. On 3 oral medications as outpatient, HbA1c 7.5%. Severe steroid-induced hyperglycemia while admitted.  - Increase basal and mealtime insulins again today.    Hypokalemia: Mild, resolved. - Monitor with ongoing loop diuretic use.   Hypomagnesemia:  - Monitoring.  Hypothyroidism:  - Continue synthroid  Obesity: BMI 35.  - Weight loss recommended over long term.   Headache: Secondary to cough.  - Antitussives augmented, tessalon scheduled, prn robitussin. Add lozenges.  - Fioricet prn. Note patient has many allergies to medications including analgesics and had reaction to fentanyl 10/16.  Vulvovaginal candidiasis: Due to hyperglycemia most likely.  - Diflucan po x1, topical nystatin  DVT prophylaxis: Lovenox Code Status: Full Family Communication: None at bedside. Disposition Plan: DC to SNF once hypoxia improved, possibly early next week.     Consultants:   None  Procedures:   RIC RUE placed 10/13  Antimicrobials:  Remdesivir 10/10 - 10/14   Subjective: Had adverse reaction to fentanyl given last night for headache. Bugs crawling on the walls, some nausea, no hives or shortness of breath/stridor. Headache continues to be severe, worse with cough. Cough associated with hemoptysis despite multiple antitussives. Hasn't tried lozenges yet. Has irritation in groin consistent with prior yeast infections. With trials of oxygen weaning, still desaturates into 70%'s and severely short of breath.  Objective: Vitals:   05/23/19 1925 05/24/19 0422 05/24/19 0500 05/24/19 0825  BP: 128/85 140/82  113/67  Pulse: 72 (!) 52    Resp: 19 (!) 24  14  Temp: 99.7 F (37.6 C) 97.9  F (36.6 C)  (!) 97.5 F (36.4 C)  TempSrc: Oral Oral  Oral  SpO2: 98% 92%  94%  Weight:   90.4 kg   Height:         Intake/Output Summary (Last 24 hours) at 05/24/2019 0850 Last data filed at 05/23/2019 1300 Gross per 24 hour  Intake 100 ml  Output -  Net 100 ml   Filed Weights   05/22/19 0500 05/23/19 0500 05/24/19 0500  Weight: 87.1 kg 87.2 kg 90.4 kg   Gen: 73 y.o. female in no distress Pulm: Nonlabored on supplemental oxygen, tachypneic with longer sentences. Crackles improved, some wheeze during cough, suspected transmitted upper airway sounds. Aeration improving. CV: Regular rate and rhythm. No murmur, rub, or gallop. No JVD, +pitting edema. GI: Abdomen soft, non-tender, non-distended, with normoactive bowel sounds.  Ext: Warm, no deformities Skin: No rashes, lesions or ulcers on visualized skin. Neuro: Alert and oriented. No focal neurological deficits. Psych: Judgement and insight appear fair. Mood euthymic & affect congruent. Behavior is appropriate.    Data Reviewed: I have personally reviewed following labs and imaging studies  CBC: Recent Labs  Lab 05/18/19 0430 05/19/19 0135 05/20/19 0418 05/24/19 0610  WBC 12.9* 10.7* 11.1* 14.1*  NEUTROABS 11.2* 9.2* 9.1* 9.9*  HGB 13.1 12.8 13.0 13.2  HCT 40.7 39.2 39.2 40.0  MCV 93.8 90.7 90.3 88.9  PLT 144* 250 283 [id number]   Basic Metabolic Panel: Recent Labs  Lab 05/18/19 0430 05/19/19 0135 05/20/19 0418 05/21/19 0310 05/22/19 0440 05/23/19 0523 05/24/19 0610  NA 139 140 137 136 137 136 138  K 3.7 3.5 4.0 3.5 3.7 3.8 3.8  CL 107 103 100 96* 97* 95* 95*  CO2 19* 25 23 29 28 30  33*  GLUCOSE 219* 261* 375* 301* 191* 218* 113*  BUN 15 20 23 23  27* 29* 30*  CREATININE 0.66 0.55 0.56 0.52 0.54 0.57 0.55  CALCIUM 8.7* 8.7* 8.4* 8.0* 8.4* 8.6* 8.7*  MG 1.4* 1.7 1.3* 1.6* 1.7  --   --   PHOS 2.5  --   --   --   --   --   --    GFR: Estimated Creatinine Clearance: 65.5 mL/min (by C-G formula based on SCr of 0.55 mg/dL). Liver Function Tests: Recent Labs  Lab 05/19/19 0135 05/20/19 0418 05/22/19 0440 05/23/19 0523 05/24/19  0610  AST 18 16 25 24 28   ALT 21 18 23 24 26   ALKPHOS 74 89 77 80 67  BILITOT 0.2* 0.5 0.3 0.4 0.6  PROT 6.8 6.5 6.3* 6.2* 6.4*  ALBUMIN 3.0* 3.0* 2.9* 2.9* 3.0*   CBG: Recent Labs  Lab 05/23/19 0834 05/23/19 1226 05/23/19 1601 05/23/19 1949 05/24/19 0819  GLUCAP 248* 374* 436* 217* 254*   Lipid Profile: No results for input(s): CHOL, HDL, LDLCALC, TRIG, CHOLHDL, LDLDIRECT in the last 72 hours. Thyroid Function Tests: No results for input(s): TSH, T4TOTAL, FREET4, T3FREE, THYROIDAB in the last 72 hours. Anemia Panel: No results for input(s): VITAMINB12, FOLATE, FERRITIN, TIBC, IRON, RETICCTPCT in the last 72 hours. Urine analysis:    Component Value Date/Time   COLORURINE Yellow 05/14/2014 0956   APPEARANCEUR Hazy 05/14/2014 0956   LABSPEC 1.028 05/14/2014 0956   PHURINE 5.0 05/14/2014 0956   GLUCOSEU Negative 05/14/2014 0956   HGBUR Negative 05/14/2014 0956   BILIRUBINUR Negative 12/08/2015 0959   BILIRUBINUR Negative 05/14/2014 0956   KETONESUR Trace 05/14/2014 0956   PROTEINUR Negative 12/08/2015 0959   PROTEINUR Negative 05/14/2014 [id number]  UROBILINOGEN 0.2 12/08/2015 0959   NITRITE Negative 12/08/2015 0959   NITRITE Negative 05/14/2014 0956   LEUKOCYTESUR Negative 12/08/2015 0959   LEUKOCYTESUR 3+ 05/14/2014 0956   No results found for this or any previous visit (from the past 240 hour(s)).    Radiology Studies: No results found.  Scheduled Meds: . allopurinol  100 mg Oral Daily  . amLODipine  10 mg Oral Daily  . atorvastatin  10 mg Oral q1800  . benzonatate  200 mg Oral TID  . Chlorhexidine Gluconate Cloth  6 each Topical Daily  . dexamethasone (DECADRON) injection  6 mg Intravenous Q24H  . enoxaparin (LOVENOX) injection  40 mg Subcutaneous QHS  . famotidine  20 mg Oral BID  . fluconazole  150 mg Oral Once  . furosemide  80 mg Intravenous Q12H  . gabapentin  400 mg Oral TID  . insulin aspart  0-20 Units Subcutaneous TID WC  . insulin aspart  0-5  Units Subcutaneous QHS  . insulin aspart  35 Units Subcutaneous TID WC  . insulin glargine  45 Units Subcutaneous BID  . levothyroxine  88 mcg Oral QAC breakfast  . linagliptin  5 mg Oral Daily  . losartan  25 mg Oral Daily  . nystatin   Topical TID  . oxymetazoline  1 spray Each Nare BID  . pneumococcal 23 valent vaccine  0.5 mL Intramuscular Tomorrow-1000  . sodium chloride flush  3 mL Intravenous Q12H  . thiamine  100 mg Oral Daily  . vitamin C  500 mg Oral Daily  . zinc sulfate  220 mg Oral Daily   Continuous Infusions:    LOS: 8 days   Time spent: 35 minutes.  Patrecia Pour, MD [facility] [email or web address] Password [email or web address] 05/24/2019, 8:50 AM

## 2019-05-25 ENCOUNTER — Other Ambulatory Visit: Payer: Self-pay

## 2019-05-25 ENCOUNTER — Inpatient Hospital Stay (HOSPITAL_COMMUNITY): Payer: PPO

## 2019-05-25 LAB — GLUCOSE, CAPILLARY
Glucose-Capillary: 443 mg/dL — ABNORMAL HIGH (ref 70–99)
Glucose-Capillary: 161 mg/dL — ABNORMAL HIGH (ref 70–99)
Glucose-Capillary: 257 mg/dL — ABNORMAL HIGH (ref 70–99)

## 2019-05-25 LAB — BASIC METABOLIC PANEL
Anion gap: 11 (ref 5–15)
BUN: 32 mg/dL — ABNORMAL HIGH (ref 8–23)
CO2: 30 mmol/L (ref 22–32)
Calcium: 9 mg/dL (ref 8.9–10.3)
Chloride: 93 mmol/L — ABNORMAL LOW (ref 98–111)
Creatinine, Ser: 0.55 mg/dL (ref 0.44–1.00)
GFR calc Af Amer: >60 mL/min (ref >60)
GFR calc non Af Amer: >60 mL/min (ref >60)
Glucose, Bld: 166 mg/dL — ABNORMAL HIGH (ref 70–99)
Potassium: 4.3 mmol/L (ref 3.5–5.1)
Sodium: 134 mmol/L — ABNORMAL LOW (ref 135–145)

## 2019-05-25 LAB — CBC
HCT: 39.1 % (ref 36.0–46.0)
Hemoglobin: 12.9 g/dL (ref 12.0–15.0)
MCH: 29.5 pg (ref 26.0–34.0)
MCHC: 33 g/dL (ref 30.0–36.0)
MCV: 89.5 fL (ref 80.0–100.0)
Platelets: 311 10*3/uL (ref 150–400)
RBC: 4.37 MIL/uL (ref 3.87–5.11)
RDW: 14.4 % (ref 11.5–15.5)
WBC: 16.7 10*3/uL — ABNORMAL HIGH (ref 4.0–10.5)
nRBC: 0 % (ref 0.0–0.2)

## 2019-05-25 LAB — D-DIMER, QUANTITATIVE: D-Dimer, Quant: 0.76 ug/mL-FEU — ABNORMAL HIGH (ref 0.00–0.50)

## 2019-05-25 LAB — TROPONIN I (HIGH SENSITIVITY): Troponin I (High Sensitivity): 6 ng/L (ref <18)

## 2019-05-25 MED ORDER — ASPIRIN 81 MG PO CHEW
324.0000 mg | CHEWABLE_TABLET | Freq: Once | ORAL | Status: AC
  Administered 2019-05-25: 05:00:00 324 mg via ORAL

## 2019-05-25 MED ORDER — INSULIN ASPART 100 UNIT/ML ~~LOC~~ SOLN
40.0000 [IU] | Freq: Three times a day (TID) | SUBCUTANEOUS | Status: DC
  Administered 2019-05-25 – 2019-05-26 (×5): 40 [IU] via SUBCUTANEOUS

## 2019-05-25 MED ORDER — MORPHINE SULFATE (PF) 4 MG/ML IV SOLN
4.0000 mg | Freq: Once | INTRAVENOUS | Status: DC
  Filled 2019-05-25: qty 1

## 2019-05-25 NOTE — Progress Notes (Signed)
PROGRESS NOTE  Teresa [name]  [id number] DOB: [birth year] DOA: 05/16/2019 PCP: [name], MD   Brief Narrative: Teresa [name] is a 73 y.o. female with a history of chronic HFpEF, CAD, T2DM, and HTN who presented to the ED 10/10 with multiple complaints including GI (N/V/D), pulmonary (shortness of breath, cough), and constitutional (malaise, fatigue, generalized weakness) that began 10/3 found to have covid-19 on 10/5. She required supplemental oxygen at 3LPM and had bilateral CXR infiltrates, so she was admitted, started on steroids and remdesivir.  Assessment & Plan: Principal Problem:   Intractable nausea and vomiting Active Problems:   Hypothyroidism   Diabetes mellitus type II, non insulin dependent (HCC)   Essential hypertension   CAD (coronary artery disease)   Pneumonia due to COVID-19 virus   Chronic diastolic CHF (congestive heart failure) (HCC)  Acute hypoxic respiratory failure due to covid-19 pneumonia:  - Wean oxygen as able. - Continue airborne, contact precautions. PPE including surgical gown, gloves, cap, shoe covers, and CAPR used during this encounter in a negative pressure room.  - Check CMP, CBC. CRP has normalized, steroid-induced hyperglycemia puts at risk of adverse outcome. Will stop steroids after today.  - Maintain euvolemia/net negative as below. - Avoid NSAIDs - Recommend proning and aggressive use of incentive spirometry. - Pt is Jehovah's Witness and declined CCP.  - Completed remdesivir 10/10 - 10/14  - Regarding hemoptysis, this is stable/improved, no anemia. Consider bronchoscopy if this persists after resolution of hypoxia.  Chest pain: ECG with stable T wave abnormalities, RBBB. HS-Troponin reassuring at 6. Not exertional, worse with cough. DDx broad including pleuritis, PE, ACS, dissection, PTX, GERD, MSK. - Recheck d-dimer. Has been getting standard prophylactic anticoagulation dosing. Pt has allergies to contrast media. In the  event of d-dimer trend upwards, would start therapeutic-dose lovenox empirically and order V/Q scan for confirmation of PE.  - Pt's allergies limit analgesic options. - Check CXR.  Covid-19 gastroenteritis: Improving clinically.  - Symptomatic management  Nasal congestion: Improving. - Continue short course of afrin to nasal saline.  Chronic HFpEF, HTN:  - Daily weights, strict I/O. Continue IV lasix BID again today given edema, hypoxia and stability of renal function. ?if steroids causing edema.  - Continue anti-hypertensives - BNP 92.9. BUN:Cr very wide and widening but CrCl stable.  T2DM uncontrolled with hyperglycemia. On 3 oral medications as outpatient, HbA1c 7.5%. Severe steroid-induced hyperglycemia while admitted. Improved but still hyperglycemic despite 230 units of insulin administered yesterday.  - Increase mealtime insulin again today. Will monitor for need to decrease dose as steroids stopped.  Hypokalemia: Mild, resolved. - Monitor with ongoing loop diuretic use.   Hypomagnesemia:  - Monitoring.  Hypothyroidism:  - Continue synthroid  Obesity: BMI 35.  - Weight loss recommended over long term.   Headache: Secondary to cough.  - Antitussives augmented, tessalon scheduled, prn robitussin. Add lozenges.  - Fioricet prn. Note patient has many allergies to medications including analgesics and had reaction to fentanyl 10/16.  Vulvovaginal candidiasis: Due to hyperglycemia most likely.  - Diflucan given po x1, topical nystatin  DVT prophylaxis: Lovenox Code Status: Full Family Communication: None at bedside. Spoke with Son again 10/17. Disposition Plan: DC to SNF once hypoxia improved, pending clinical improvement.  Consultants:   None  Procedures:   RIC RUE placed 10/13  Antimicrobials:  Remdesivir 10/10 - 10/14   Subjective: Woke up this morning with pain in the central chest radiating to left upper chest/shoulder, feels sharp, severe, constant,  worse  with deep inspiration, not changing with exertion. Never had this before. No significant increase in shortness of breath. Mild nausea.  Objective: Vitals:   05/25/19 0338 05/25/19 0446 05/25/19 0511 05/25/19 0725  BP: 136/80  131/77 (!) 144/76  Pulse: (!) 47  62 67  Resp: 14   18  Temp: 97.8 F (36.6 C)  98.1 F (36.7 C) (!) 97.5 F (36.4 C)  TempSrc: Oral  Oral Oral  SpO2: 94%  90% 95%  Weight:  86.3 kg    Height:       No intake or output data in the 24 hours ending 05/25/19 1140 Filed Weights   05/23/19 0500 05/24/19 0500 05/25/19 0446  Weight: 87.2 kg 90.4 kg 86.3 kg   Gen: 73 y.o. female in no distress Pulm: Nonlabored on supplemental oxygen, crackles diffusely, no wheezing, air movement evident bilaterally. CV: Regular rate and rhythm. No murmur, rub, or gallop. No JVD, 1+ pitting dependent edema. GI: Abdomen soft, non-tender, non-distended, with normoactive bowel sounds.  Ext: Warm, no deformities Skin: No new rashes, lesions or ulcers on visualized skin. Neuro: Alert and oriented. No focal neurological deficits. Psych: Judgement and insight appear fair. Mood euthymic & affect congruent. Behavior is appropriate.    Data Reviewed: I have personally reviewed following labs and imaging studies  CBC: Recent Labs  Lab 05/19/19 0135 05/20/19 0418 05/24/19 0610 05/25/19 0552  WBC 10.7* 11.1* 14.1* 16.7*  NEUTROABS 9.2* 9.1* 9.9*  --   HGB 12.8 13.0 13.2 12.9  HCT 39.2 39.2 40.0 39.1  MCV 90.7 90.3 88.9 89.5  PLT 250 283 308 [id number]   Basic Metabolic Panel: Recent Labs  Lab 05/19/19 0135 05/20/19 0418 05/21/19 0310 05/22/19 0440 05/23/19 0523 05/24/19 0610 05/25/19 0552  NA 140 137 136 137 136 138 134*  K 3.5 4.0 3.5 3.7 3.8 3.8 4.3  CL 103 100 96* 97* 95* 95* 93*  CO2 25 23 29 28 30  33* 30  GLUCOSE 261* 375* 301* 191* 218* 113* 166*  BUN 20 23 23  27* 29* 30* 32*  CREATININE 0.55 0.56 0.52 0.54 0.57 0.55 0.55  CALCIUM 8.7* 8.4* 8.0* 8.4* 8.6* 8.7* 9.0  MG  1.7 1.3* 1.6* 1.7  --   --   --    GFR: Estimated Creatinine Clearance: 63.9 mL/min (by C-G formula based on SCr of 0.55 mg/dL). Liver Function Tests: Recent Labs  Lab 05/19/19 0135 05/20/19 0418 05/22/19 0440 05/23/19 0523 05/24/19 0610  AST 18 16 25 24 28   ALT 21 18 23 24 26   ALKPHOS 74 89 77 80 67  BILITOT 0.2* 0.5 0.3 0.4 0.6  PROT 6.8 6.5 6.3* 6.2* 6.4*  ALBUMIN 3.0* 3.0* 2.9* 2.9* 3.0*   CBG: Recent Labs  Lab 05/24/19 0819 05/24/19 1226 05/24/19 1658 05/24/19 2018 05/24/19 2325  GLUCAP 254* 417* 181* 190* 265*   Lipid Profile: No results for input(s): CHOL, HDL, LDLCALC, TRIG, CHOLHDL, LDLDIRECT in the last 72 hours. Thyroid Function Tests: No results for input(s): TSH, T4TOTAL, FREET4, T3FREE, THYROIDAB in the last 72 hours. Anemia Panel: No results for input(s): VITAMINB12, FOLATE, FERRITIN, TIBC, IRON, RETICCTPCT in the last 72 hours. Urine analysis:    Component Value Date/Time   COLORURINE Yellow 05/14/2014 0956   APPEARANCEUR Hazy 05/14/2014 0956   LABSPEC 1.028 05/14/2014 0956   PHURINE 5.0 05/14/2014 0956   GLUCOSEU Negative 05/14/2014 0956   HGBUR Negative 05/14/2014 0956   BILIRUBINUR Negative 12/08/2015 0959   BILIRUBINUR Negative 05/14/2014 [id number]  KETONESUR Trace 05/14/2014 0956   PROTEINUR Negative 12/08/2015 0959   PROTEINUR Negative 05/14/2014 0956   UROBILINOGEN 0.2 12/08/2015 0959   NITRITE Negative 12/08/2015 0959   NITRITE Negative 05/14/2014 0956   LEUKOCYTESUR Negative 12/08/2015 0959   LEUKOCYTESUR 3+ 05/14/2014 0956   No results found for this or any previous visit (from the past 240 hour(s)).    Radiology Studies: No results found.  Scheduled Meds: . allopurinol  100 mg Oral Daily  . amLODipine  10 mg Oral Daily  . atorvastatin  10 mg Oral q1800  . benzonatate  200 mg Oral TID  . Chlorhexidine Gluconate Cloth  6 each Topical Daily  . enoxaparin (LOVENOX) injection  40 mg Subcutaneous QHS  . famotidine  20 mg Oral BID  .  furosemide  80 mg Intravenous Q12H  . gabapentin  400 mg Oral TID  . insulin aspart  0-20 Units Subcutaneous TID WC  . insulin aspart  0-5 Units Subcutaneous QHS  . insulin aspart  40 Units Subcutaneous TID WC  . insulin glargine  45 Units Subcutaneous BID  . levothyroxine  88 mcg Oral QAC breakfast  . linagliptin  5 mg Oral Daily  . losartan  25 mg Oral Daily  . nystatin   Topical TID  . oxymetazoline  1 spray Each Nare BID  . pneumococcal 23 valent vaccine  0.5 mL Intramuscular Tomorrow-1000  . sodium chloride flush  3 mL Intravenous Q12H  . thiamine  100 mg Oral Daily  . vitamin C  500 mg Oral Daily  . zinc sulfate  220 mg Oral Daily   Continuous Infusions:    LOS: 9 days   Time spent: 35 minutes.  Patrecia Pour, MD [facility] [email or web address] Password [email or web address] 05/25/2019, 11:40 AM

## 2019-05-25 NOTE — [place] FL2 (Signed)
[place] MEDICAID FL2 LEVEL OF CARE SCREENING TOOL     IDENTIFICATION  Patient Name: Teresa [name] Birthdate: 1946-06-18 Sex: female Admission Date (Current Location): 05/16/2019  [facility] and [place] Number:  Herbalist and Address:  [facility] [place], [place] [address] [place], [place] [address]      Provider Number: [id number]  Attending Physician Name and Address:  Patrecia Pour, MD  Relative Name and Phone Number:       Current Level of Care: Hospital Recommended Level of Care: [place] Prior Approval Number:    Date Approved/Denied:   PASRR Number:    Discharge Plan: SNF    Current Diagnoses: Patient Active Problem List   Diagnosis Date Noted  . Pneumonia due to COVID-19 virus 05/16/2019  . Intractable nausea and vomiting 05/16/2019  . Chronic diastolic CHF (congestive heart failure) ([place]) 05/16/2019  . Encounter for screening colonoscopy   . Benign neoplasm of cecum   . Diverticulosis of large intestine without diverticulitis   . Tortuous colon   . Polyp of sigmoid colon   . Intestinal lump   . Polyneuropathy associated with underlying disease ([place]) 12/11/2016  . Microcalcifications of the breast 12/15/2015  . Back pain, chronic 06/28/2015  . Chronic airway obstruction ([place]) 06/28/2015  . 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 Essential hypertension 06/21/2009  . CAD (coronary artery disease) 06/21/2009  . FEVER UNSPECIFIED 06/21/2009  . NAUSEA 06/21/2009  . DYSPHAGIA 06/21/2009  . RUQ PAIN 06/21/2009  . HIATAL HERNIA 01/07/2008  . Hypothyroidism 11/22/2007  . ANXIETY 11/22/2007  . DEPRESSION 11/22/2007  . Hypertensive heart disease without heart failure 11/22/2007  . 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 RENAL CALCULUS 11/22/2007  . Osteoarthritis 11/22/2007  . SLEEP APNEA 11/22/2007  . HEADACHE, CHRONIC 11/22/2007  . GASTRITIS 10/23/2007  . DIVERTICULOSIS, COLON 10/23/2007  . Diabetes mellitus type II, non  insulin dependent ([place]) 07/16/2007  . HYPERCHOLESTEROLEMIA 07/16/2007  . OBESITY, MODERATE 07/16/2007  . ISCHEMIC HEART DISEASE 07/16/2007  . ESOPHAGEAL REFLUX 07/16/2007  . SCLERODERMA 07/16/2007  . DYSPNEA 07/16/2007  . COUGH, CHRONIC 07/16/2007    Orientation RESPIRATION BLADDER Height & Weight     Self, Time, Situation, Place  Normal Continent Weight: 190 lb 4 oz (86.3 kg) Height:  5\' 2"  (157.5 cm)  BEHAVIORAL SYMPTOMS/MOOD NEUROLOGICAL BOWEL NUTRITION STATUS        Diet(See dc summary)  AMBULATORY STATUS COMMUNICATION OF NEEDS Skin   Limited Assist Verbally Normal                       Personal Care Assistance Level of Assistance  Bathing, Feeding, Dressing Bathing Assistance: Limited assistance Feeding assistance: Independent Dressing Assistance: Limited assistance     Functional Limitations Info             SPECIAL CARE FACTORS FREQUENCY  PT (By licensed PT), OT (By licensed OT)     PT Frequency: 5 OT Frequency: 5            Contractures      Additional Factors Info  Code Status, Allergies Code Status Info: Full code Allergies Info: acitracin-neomycin-polymyxin Clarithromycin Codeine Dilaudid  (Hydromorphone) Hcl  Iodine Iohexol Neomycin-bacitracin Zn-polymyx Tamiflu  (Oseltamivir Phosphate) Zolpidem Benzalkonium Chloride Lidocaine Hcl Morphine Tape Tapentadol           Current Medications (05/25/2019):  This is the current hospital active medication list Current Facility-Administered Medications  Medication Dose Route Frequency Provider Last Rate  Last Dose  . acetaminophen (TYLENOL) tablet 650 mg  650 mg Oral Q6H PRN Opyd, Ilene Qua, MD   650 mg at 05/25/19 0935  . allopurinol (ZYLOPRIM) tablet 100 mg  100 mg Oral Daily Opyd, Ilene Qua, MD   100 mg at 05/24/19 2141  . amLODipine (NORVASC) tablet 10 mg  10 mg Oral Daily Cherene Altes, MD   10 mg at 05/25/19 0935  . atorvastatin (LIPITOR) tablet 10 mg  10 mg Oral q1800 Opyd, Ilene Qua, MD    10 mg at 05/24/19 1713  . benzonatate (TESSALON) capsule 200 mg  200 mg Oral TID Patrecia Pour, MD   200 mg at 05/25/19 0936  . bisacodyl (DULCOLAX) suppository 10 mg  10 mg Rectal Daily PRN Cherene Altes, MD      . butalbital-acetaminophen-caffeine (FIORICET) [phone] MG per tablet 1 tablet  1 tablet Oral Q4H PRN Phillips Grout, MD   1 tablet at 05/23/19 0129  . Chlorhexidine Gluconate Cloth 2 % PADS 6 each  6 each Topical Daily Cherene Altes, MD   6 each at 05/25/19 [phone]  . enoxaparin (LOVENOX) injection 40 mg  40 mg Subcutaneous QHS Opyd, Ilene Qua, MD   40 mg at 05/24/19 2141  . famotidine (PEPCID) tablet 20 mg  20 mg Oral BID Cherene Altes, MD   20 mg at 05/25/19 0936  . fentaNYL (SUBLIMAZE) injection 25-50 mcg  25-50 mcg Intravenous Q2H PRN Opyd, Ilene Qua, MD   50 mcg at 05/24/19 0211  . furosemide (LASIX) injection 80 mg  80 mg Intravenous Q12H Cherene Altes, MD   80 mg at 05/25/19 0934  . gabapentin (NEURONTIN) capsule 400 mg  400 mg Oral TID Vianne Bulls, MD   400 mg at 05/25/19 0936  . guaiFENesin-dextromethorphan (ROBITUSSIN DM) 100-10 MG/5ML syrup 5 mL  5 mL Oral Q4H PRN Cherene Altes, MD   5 mL at 05/25/19 0935  . influenza vaccine adjuvanted (FLUAD) injection 0.5 mL  0.5 mL Intramuscular Prior to discharge Joette Catching T, MD      . insulin aspart (novoLOG) injection 0-20 Units  0-20 Units Subcutaneous TID WC Cherene Altes, MD   3 Units at 05/25/19 [phone]  . insulin aspart (novoLOG) injection 0-5 Units  0-5 Units Subcutaneous QHS Cherene Altes, MD   2 Units at 05/23/19 2220  . insulin aspart (novoLOG) injection 40 Units  40 Units Subcutaneous TID WC Patrecia Pour, MD   40 Units at 05/25/19 0934  . insulin glargine (LANTUS) injection 45 Units  45 Units Subcutaneous BID Patrecia Pour, MD   45 Units at 05/25/19 0934  . levothyroxine (SYNTHROID) tablet 88 mcg  88 mcg Oral QAC breakfast Vianne Bulls, MD   88 mcg at 05/25/19 0513  . linagliptin  (TRADJENTA) tablet 5 mg  5 mg Oral Daily Cherene Altes, MD   5 mg at 05/25/19 0935  . lip balm (CARMEX) ointment   Topical PRN Cherene Altes, MD      . losartan (COZAAR) tablet 25 mg  25 mg Oral Daily Opyd, Ilene Qua, MD   25 mg at 05/25/19 0936  . menthol-cetylpyridinium (CEPACOL) lozenge 3 mg  1 lozenge Oral PRN Patrecia Pour, MD   3 mg at 05/24/19 1139  . metoCLOPramide (REGLAN) injection 5 mg  5 mg Intravenous Q6H PRN Opyd, Ilene Qua, MD      . nystatin (MYCOSTATIN/NYSTOP) topical powder   Topical  TID Patrecia Pour, MD      . oxymetazoline (AFRIN) 0.05 % nasal spray 1 spray  1 spray Each Nare BID Patrecia Pour, MD   1 spray at 05/25/19 0936  . phenol (CHLORASEPTIC) mouth spray 1 spray  1 spray Mouth/Throat PRN Cherene Altes, MD   1 spray at 05/19/19 0845  . pneumococcal 23 valent vaccine (PNU-IMMUNE) injection 0.5 mL  0.5 mL Intramuscular Tomorrow-1000 Joette Catching T, MD      . promethazine (PHENERGAN) injection 12.5 mg  12.5 mg Intravenous Q6H PRN Opyd, Ilene Qua, MD   12.5 mg at 05/17/19 1421  . senna-docusate (Senokot-S) tablet 1 tablet  1 tablet Oral BID PRN Cherene Altes, MD   1 tablet at 05/20/19 1059  . sodium chloride (OCEAN) 0.65 % nasal spray 1 spray  1 spray Each Nare PRN Cherene Altes, MD   1 spray at 05/19/19 2112  . sodium chloride flush (NS) 0.9 % injection 10-40 mL  10-40 mL Intracatheter PRN Joette Catching T, MD      . sodium chloride flush (NS) 0.9 % injection 3 mL  3 mL Intravenous Q12H Opyd, Ilene Qua, MD   3 mL at 05/25/19 0937  . thiamine (VITAMIN B-1) tablet 100 mg  100 mg Oral Daily Cherene Altes, MD   100 mg at 05/25/19 0936  . vitamin C (ASCORBIC ACID) tablet 500 mg  500 mg Oral Daily Cherene Altes, MD   500 mg at 05/25/19 0935  . witch hazel-glycerin (TUCKS) pad   Topical PRN Patrecia Pour, MD      . zinc sulfate capsule 220 mg  220 mg Oral Daily Cherene Altes, MD   220 mg at 05/25/19 [id number]     Discharge  Medications: Please see discharge summary for a list of discharge medications.  Relevant Imaging Results:  Relevant Lab Results:   Additional [place], LCSW

## 2019-05-25 NOTE — Plan of Care (Signed)

## 2019-05-26 ENCOUNTER — Ambulatory Visit: Payer: PPO | Admitting: Podiatry

## 2019-05-26 LAB — CBC WITH DIFFERENTIAL/PLATELET
Abs Immature Granulocytes: 0.93 10*3/uL — ABNORMAL HIGH (ref 0.00–0.07)
Basophils Absolute: 0.1 10*3/uL (ref 0.0–0.1)
Basophils Relative: 1 %
Eosinophils Absolute: 0 10*3/uL (ref 0.0–0.5)
Eosinophils Relative: 0 %
HCT: 39.9 % (ref 36.0–46.0)
Hemoglobin: 13.3 g/dL (ref 12.0–15.0)
Immature Granulocytes: 5 %
Lymphocytes Relative: 7 %
Lymphs Abs: 1.3 10*3/uL (ref 0.7–4.0)
MCH: 29.8 pg (ref 26.0–34.0)
MCHC: 33.3 g/dL (ref 30.0–36.0)
MCV: 89.5 fL (ref 80.0–100.0)
Monocytes Absolute: 1.1 10*3/uL — ABNORMAL HIGH (ref 0.1–1.0)
Monocytes Relative: 6 %
Neutro Abs: 14.6 10*3/uL — ABNORMAL HIGH (ref 1.7–7.7)
Neutrophils Relative %: 81 %
Platelets: 315 10*3/uL (ref 150–400)
RBC: 4.46 MIL/uL (ref 3.87–5.11)
RDW: 14.4 % (ref 11.5–15.5)
WBC: 18 10*3/uL — ABNORMAL HIGH (ref 4.0–10.5)
nRBC: 0 % (ref 0.0–0.2)

## 2019-05-26 LAB — BASIC METABOLIC PANEL
Anion gap: 13 (ref 5–15)
BUN: 38 mg/dL — ABNORMAL HIGH (ref 8–23)
CO2: 30 mmol/L (ref 22–32)
Calcium: 8.8 mg/dL — ABNORMAL LOW (ref 8.9–10.3)
Chloride: 92 mmol/L — ABNORMAL LOW (ref 98–111)
Creatinine, Ser: 0.61 mg/dL (ref 0.44–1.00)
GFR calc Af Amer: >60 mL/min (ref >60)
GFR calc non Af Amer: >60 mL/min (ref >60)
Glucose, Bld: 155 mg/dL — ABNORMAL HIGH (ref 70–99)
Potassium: 4.4 mmol/L (ref 3.5–5.1)
Sodium: 135 mmol/L (ref 135–145)

## 2019-05-26 LAB — D-DIMER, QUANTITATIVE: D-Dimer, Quant: 0.73 ug/mL-FEU — ABNORMAL HIGH (ref 0.00–0.50)

## 2019-05-26 LAB — C-REACTIVE PROTEIN: CRP: 1.3 mg/dL — ABNORMAL HIGH (ref <1.0)

## 2019-05-26 LAB — GLUCOSE, CAPILLARY
Glucose-Capillary: 148 mg/dL — ABNORMAL HIGH (ref 70–99)
Glucose-Capillary: 120 mg/dL — ABNORMAL HIGH (ref 70–99)
Glucose-Capillary: 325 mg/dL — ABNORMAL HIGH (ref 70–99)
Glucose-Capillary: 168 mg/dL — ABNORMAL HIGH (ref 70–99)

## 2019-05-26 MED ORDER — INSULIN ASPART 100 UNIT/ML ~~LOC~~ SOLN
20.0000 [IU] | Freq: Three times a day (TID) | SUBCUTANEOUS | Status: DC
  Administered 2019-05-27: 12:00:00 20 [IU] via SUBCUTANEOUS

## 2019-05-26 MED ORDER — INSULIN ASPART 100 UNIT/ML ~~LOC~~ SOLN
20.0000 [IU] | Freq: Once | SUBCUTANEOUS | Status: AC
  Administered 2019-05-26: 18:00:00 20 [IU] via SUBCUTANEOUS

## 2019-05-26 MED ORDER — INSULIN GLARGINE 100 UNIT/ML ~~LOC~~ SOLN
30.0000 [IU] | Freq: Two times a day (BID) | SUBCUTANEOUS | Status: DC
  Administered 2019-05-26: 30 [IU] via SUBCUTANEOUS
  Filled 2019-05-26 (×2): qty 0.3

## 2019-05-26 NOTE — Progress Notes (Signed)
PROGRESS NOTE  Teresa [name]  [id number] DOB: 1946-05-28 DOA: 05/16/2019 PCP: [name], MD   Brief Narrative: Teresa [name] is a 73 y.o. female with a history of chronic HFpEF, CAD, T2DM, and HTN who presented to the ED 10/10 with multiple complaints including GI (N/V/D), pulmonary (shortness of breath, cough), and constitutional (malaise, fatigue, generalized weakness) that began 10/3 found to have covid-19 on 10/5. She required supplemental oxygen at 3LPM and had bilateral CXR infiltrates, so she was admitted, started on steroids and remdesivir.  Assessment & Plan: Principal Problem:   Intractable nausea and vomiting Active Problems:   Hypothyroidism   Diabetes mellitus type II, non insulin dependent (HCC)   Essential hypertension   CAD (coronary artery disease)   Pneumonia due to COVID-19 virus   Chronic diastolic CHF (congestive heart failure) (HCC)  Acute hypoxic respiratory failure due to covid-19 pneumonia:  - Wean oxygen as able. Currently still 3L O2.  - Continue airborne, contact precautions. PPE including surgical gown, gloves, cap, shoe covers, and CAPR used during this encounter in a negative pressure room.  - CRP has improved, steroid-induced hyperglycemia puts at risk of adverse outcome. Will stop steroids today. - Maintain euvolemia/net negative as below. - Avoid NSAIDs - Recommend proning and aggressive use of incentive spirometry. - Pt is Jehovah's Witness and declined CCP.  - Completed remdesivir 10/10 - 10/14  - Regarding hemoptysis, this is stable/improved, no anemia. Consider bronchoscopy if this persists after resolution of hypoxia.  Chest pain: Improved. No evidence of ACS w/unchanged ECG, negative troponin, d-dimer trending down, has been on anticoagulation for VTE ppx. CXR shows some improvement.  - Continue to treat supportively - Pt's allergies limit analgesic options.  Covid-19 gastroenteritis: Improving clinically.  - Symptomatic  management  Nasal congestion: Improving. - Continue short course of afrin to nasal saline.  Chronic HFpEF, HTN:  - Daily weights, strict I/O. Continue IV lasix BID again today given edema, hypoxia and stability of renal function. ?if steroids causing edema.  - Continue anti-hypertensives - BNP 92.9. BUN:Cr remains very wide and widening but CrCl stable.  T2DM uncontrolled with hyperglycemia. On 3 oral medications as outpatient, HbA1c 7.5%. Severe steroid-induced hyperglycemia while admitted. Improved but still hyperglycemic despite 230 units of insulin administered yesterday.  - Increased mealtime insulin again today. Typically spikes at lunch, ok to give full mealtime insulin despite CBG 120 this AM. Will monitor for need to decrease dose as steroids stopped.  Hypokalemia: Mild, resolved. - Monitor with ongoing loop diuretic use.   Hypomagnesemia:  - Monitoring.  Hypothyroidism:  - Continue synthroid  Obesity: BMI 35.  - Weight loss recommended over long term.   Headache: Secondary to cough.  - Antitussives augmented, tessalon scheduled, prn robitussin. Add lozenges.  - Fioricet prn. Note patient has many allergies to medications including analgesics and had reaction to fentanyl 10/16.  Vulvovaginal candidiasis: Due to hyperglycemia most likely.  - Diflucan given po x1, topical nystatin  DVT prophylaxis: Lovenox Code Status: Full Family Communication: None at bedside. Spoke with Son again 10/17. Disposition Plan: DC to SNF once hypoxia improved, pending clinical improvement. Anticipate possible 10/20-10/21.  Consultants:   None  Procedures:   RIC RUE placed 10/13  Antimicrobials:  Remdesivir 10/10 - 10/14   Subjective: Chest pain improved, dyspnea stable, able to work with therapy but very short of breath. About the same from yesterday. Agrees she'll need rehabilitation.   Objective: Vitals:   05/25/19 1539 05/25/19 2014 05/26/19 0337 05/26/19  0734  BP: 117/69  119/61 118/68 128/78  Pulse: 64 72 (!) 55 (!) 54  Resp: 17 18 13 17   Temp: (!) 97.4 F (36.3 C) 97.7 F (36.5 C) 97.9 F (36.6 C) (!) 96.4 F (35.8 C)  TempSrc: Oral Oral Oral Oral  SpO2: 95% 98% 100% 99%  Weight:      Height:       No intake or output data in the 24 hours ending 05/26/19 1537 Filed Weights   05/23/19 0500 05/24/19 0500 05/25/19 0446  Weight: 87.2 kg 90.4 kg 86.3 kg   Gen: Pleasant female in no distress Pulm: Nonlabored on 3L O2. Crackles improved today, no wheezes, improved aeration.. CV: Regular rate and rhythm. No murmur, rub, or gallop. No JVD, trace dependent edema. GI: Abdomen soft, non-tender, non-distended, with normoactive bowel sounds.  Ext: Warm, no deformities Skin: No new rashes, lesions or ulcers on visualized skin. Neuro: Alert and oriented. No focal neurological deficits. Psych: Judgement and insight appear fair. Mood euthymic & affect congruent. Behavior is appropriate.    Data Reviewed: I have personally reviewed following labs and imaging studies  CBC: Recent Labs  Lab 05/20/19 0418 05/24/19 0610 05/25/19 0552 05/26/19 0500  WBC 11.1* 14.1* 16.7* 18.0*  NEUTROABS 9.1* 9.9*  --  14.6*  HGB 13.0 13.2 12.9 13.3  HCT 39.2 40.0 39.1 39.9  MCV 90.3 88.9 89.5 89.5  PLT 283 308 311 [id number]   Basic Metabolic Panel: Recent Labs  Lab 05/20/19 0418 05/21/19 0310 05/22/19 0440 05/23/19 0523 05/24/19 0610 05/25/19 0552 05/26/19 0500  NA 137 136 137 136 138 134* 135  K 4.0 3.5 3.7 3.8 3.8 4.3 4.4  CL 100 96* 97* 95* 95* 93* 92*  CO2 23 29 28 30  33* 30 30  GLUCOSE 375* 301* 191* 218* 113* 166* 155*  BUN 23 23 27* 29* 30* 32* 38*  CREATININE 0.56 0.52 0.54 0.57 0.55 0.55 0.61  CALCIUM 8.4* 8.0* 8.4* 8.6* 8.7* 9.0 8.8*  MG 1.3* 1.6* 1.7  --   --   --   --    GFR: Estimated Creatinine Clearance: 63.9 mL/min (by C-G formula based on SCr of 0.61 mg/dL). Liver Function Tests: Recent Labs  Lab 05/20/19 0418 05/22/19 0440 05/23/19 0523  05/24/19 0610  AST 16 25 24 28   ALT 18 23 24 26   ALKPHOS 89 77 80 67  BILITOT 0.5 0.3 0.4 0.6  PROT 6.5 6.3* 6.2* 6.4*  ALBUMIN 3.0* 2.9* 2.9* 3.0*   CBG: Recent Labs  Lab 05/25/19 1125 05/25/19 1606 05/25/19 2059 05/26/19 0740 05/26/19 1156  GLUCAP 443* 161* 257* 120* 325*   Lipid Profile: No results for input(s): CHOL, HDL, LDLCALC, TRIG, CHOLHDL, LDLDIRECT in the last 72 hours. Thyroid Function Tests: No results for input(s): TSH, T4TOTAL, FREET4, T3FREE, THYROIDAB in the last 72 hours. Anemia Panel: No results for input(s): VITAMINB12, FOLATE, FERRITIN, TIBC, IRON, RETICCTPCT in the last 72 hours. Urine analysis:    Component Value Date/Time   COLORURINE Yellow 05/14/2014 0956   APPEARANCEUR Hazy 05/14/2014 0956   LABSPEC 1.028 05/14/2014 0956   PHURINE 5.0 05/14/2014 0956   GLUCOSEU Negative 05/14/2014 0956   HGBUR Negative 05/14/2014 0956   BILIRUBINUR Negative 12/08/2015 0959   BILIRUBINUR Negative 05/14/2014 0956   KETONESUR Trace 05/14/2014 0956   PROTEINUR Negative 12/08/2015 0959   PROTEINUR Negative 05/14/2014 0956   UROBILINOGEN 0.2 12/08/2015 0959   NITRITE Negative 12/08/2015 0959   NITRITE Negative 05/14/2014 0956   LEUKOCYTESUR Negative 12/08/2015  [place] 3+ 05/14/2014 0956   No results found for this or any previous visit (from the past 240 hour(s)).    Radiology Studies: Dg Chest Port 1 View  Result Date: 05/25/2019 CLINICAL DATA:  COVID-19 positive. EXAM: PORTABLE CHEST 1 VIEW COMPARISON:  May 18, 2019 FINDINGS: Pulmonary infiltrates, left greater than right, are stable on the left and mildly improved on the right in the interval. The right PICC line terminates in the central SVC. No pneumothorax. No other acute abnormalities or changes. IMPRESSION: 1. Persistent infiltrates, stable on the left and improved on the right. The new right PICC line is in good position. Electronically Signed   By: Dorise Bullion III M.D   On:  05/25/2019 15:01    Scheduled Meds: . allopurinol  100 mg Oral Daily  . amLODipine  10 mg Oral Daily  . atorvastatin  10 mg Oral q1800  . benzonatate  200 mg Oral TID  . Chlorhexidine Gluconate Cloth  6 each Topical Daily  . enoxaparin (LOVENOX) injection  40 mg Subcutaneous QHS  . famotidine  20 mg Oral BID  . furosemide  80 mg Intravenous Q12H  . gabapentin  400 mg Oral TID  . insulin aspart  0-20 Units Subcutaneous TID WC  . insulin aspart  0-5 Units Subcutaneous QHS  . insulin aspart  40 Units Subcutaneous TID WC  . insulin glargine  45 Units Subcutaneous BID  . levothyroxine  88 mcg Oral QAC breakfast  . linagliptin  5 mg Oral Daily  . losartan  25 mg Oral Daily  . nystatin   Topical TID  . oxymetazoline  1 spray Each Nare BID  . pneumococcal 23 valent vaccine  0.5 mL Intramuscular Tomorrow-1000  . sodium chloride flush  3 mL Intravenous Q12H  . thiamine  100 mg Oral Daily  . vitamin C  500 mg Oral Daily  . zinc sulfate  220 mg Oral Daily   Continuous Infusions:    LOS: 10 days   Time spent: 25 minutes.  Patrecia Pour, MD [facility] [email or web address] Password [facility] 05/26/2019, 3:37 PM

## 2019-05-26 NOTE — Progress Notes (Signed)
Occupational Therapy Treatment Patient Details Name: Teresa [name] MRN: [id number] DOB: [birth year] Today's Date: 05/26/2019    History of present illness 73 y.o. female with medical history significant for HTN, DM, CHF with preserved EF in 2016, and CAD, DOE, edema, heart murmur, orthopnea,sleep apnea, vertigo , presented to the ED w/ nausea, vomiting, diarrhea, general malaise, fevers, cough, and shortness of breath.  She had been diagnosed with COVID-19 on 05/12/2019. She has since gone on to develop cough, shortness of breath, generalized abdominal discomfort, nausea, and nonbloody vomiting.   OT comments  Pt continues to demonstrate increased fatigue with minimal activity and DOE, initially upon entry pt with 3/4 DOE with conversation while seated in recliner. Instructed pt in deep breathing techniques and improvement noted. Pt engaging in seated grooming ADL and instructed pt in seated UB HEP for continued strengthening/endurance. Pt requires frequent rest breaks with all activity. Pt on 3L O2 with SpO2 >90% during session. Have updated d/c recs; given pt slow progress and that she lives alone at baseline feel she will require follow up SNF level therapies prior to return home. Will continue to follow while acutely admitted.   Follow Up Recommendations  SNF;Supervision/Assistance - 24 hour    Equipment Recommendations  3 in 1 bedside commode          Precautions / Restrictions Precautions Precautions: Fall;Other (comment) Precaution Comments: desat with activity, RLE buckles Restrictions Weight Bearing Restrictions: No       Mobility Bed Mobility               General bed mobility comments: Pt sitting in recliner at therapist arrival  Transfers Overall transfer level: Needs assistance Equipment used: None Transfers: Sit to/from Stand Sit to Stand: Supervision         General transfer comment: close by due to weakness    Balance Overall balance assessment:  Needs assistance Sitting-balance support: Feet supported Sitting balance-Leahy Scale: Good     Standing balance support: Bilateral upper extremity supported;During functional activity Standing balance-Leahy Scale: Fair                             ADL either performed or assessed with clinical judgement   ADL Overall ADL's : Needs assistance/impaired     Grooming: Set up;Brushing hair;Sitting Grooming Details (indicate cue type and reason): combing hair after use of shampoo cap, fatigues easily                             Functional mobility during ADLs: Min guard;Minimal assistance General ADL Comments: pt reports having already performed morning ADL and ambulated with PT, fatigued but agreeable to grooming ADL and seated ther ex                       Cognition Arousal/Alertness: Awake/alert Behavior During Therapy: WFL for tasks assessed/performed;Anxious Overall Cognitive Status: Within Functional Limits for tasks assessed                                          Exercises Exercises: Other exercises Other Exercises Other Exercises: issued level 1 theraband and instructed Pt in HEP, pt with increased fatigue and reports chest pain with effort so instructed to perform exercise without use of tband at this time and just  utilize AROM, pt verbalizing understanding    Shoulder Instructions       General Comments      Pertinent Vitals/ Pain       Pain Assessment: Faces Faces Pain Scale: Hurts even more Pain Location: chest with deep breaths, certain movements Pain Descriptors / Indicators: Discomfort;Grimacing Pain Intervention(s): Repositioned;Limited activity within patient's tolerance;Monitored during session  Home Living                                          Prior Functioning/Environment              Frequency  Min 2X/week        Progress Toward Goals  OT Goals(current goals can now be  found in the care plan section)  Progress towards OT goals: OT to reassess next treatment  Acute Rehab OT Goals Patient Stated Goal: get stronger OT Goal Formulation: With patient Time For Goal Achievement: 06/04/19 Potential to Achieve Goals: Good ADL Goals Pt Will Perform Grooming: with modified independence;standing Pt Will Perform Lower Body Bathing: with modified independence;sit to/from stand Pt Will Perform Lower Body Dressing: with modified independence;sit to/from stand Pt Will Transfer to Toilet: with modified independence;ambulating Pt Will Perform Toileting - Clothing Manipulation and hygiene: with modified independence;sit to/from stand Additional ADL Goal #1: Pt will independently verbalize at least 3 energy conservation techniques during ADL task.  Plan Discharge plan needs to be updated;Frequency needs to be updated    Co-evaluation                 AM-PAC OT "6 Clicks" Daily Activity     Outcome Measure   Help from another person eating meals?: None Help from another person taking care of personal grooming?: A Little Help from another person toileting, which includes using toliet, bedpan, or urinal?: A Little Help from another person bathing (including washing, rinsing, drying)?: A Little Help from another person to put on and taking off regular upper body clothing?: None Help from another person to put on and taking off regular lower body clothing?: A Little 6 Click Score: 20    End of Session Equipment Utilized During Treatment: Oxygen  OT Visit Diagnosis: Muscle weakness (generalized) (M62.81);Unsteadiness on feet (R26.81)(decreased activity tolerance)   Activity Tolerance Patient tolerated treatment well;Patient limited by fatigue   Patient Left in chair;with call bell/phone within reach   Nurse Communication Mobility status        Time: [id number] OT Time Calculation (min): 26 min  Charges: OT General Charges $OT Visit: 1 Visit OT  Treatments $Self Care/Home Management : 8-22 mins $Therapeutic Activity: 8-22 mins  Lou Cal, OT Supplemental Rehabilitation Services Pager [phone] Office [phone]    Teresa [name] 05/26/2019, 2:28 PM

## 2019-05-26 NOTE — Progress Notes (Addendum)
Physical Therapy Treatment Patient Details Name: Teresa [name] MRN: [id number] DOB: 1946/04/28 Today's Date: 05/26/2019    History of Present Illness 73 y.o. female with medical history significant for HTN, DM, CHF with preserved EF in 2016, and CAD, DOE, edema, heart murmur, orthopnea,sleep apnea, vertigo , presented to the ED w/ nausea, vomiting, diarrhea, general malaise, fevers, cough, and shortness of breath.  She had been diagnosed with COVID-19 on 05/12/2019. She has since gone on to develop cough, shortness of breath, generalized abdominal discomfort, nausea, and nonbloody vomiting.    PT Comments    The patient reports chest discomfort, relates to breathing deep. Patient is limited in distance today, 89' and Dyspnea 4/4, several stop and rest breaks required, ambualted on 3 L [place]. Continue PT for mobility.  Follow Up Recommendations  SNF;Supervision/Assistance - 24 hour     Equipment Recommendations  None recommended by PT    Recommendations for Other Services       Precautions / Restrictions Precautions Precautions: Fall;Other (comment) Precaution Comments: desat with activity, RLE buckles    Mobility  Bed Mobility               General bed mobility comments: Pt sitting in recliner at therapist arrival  Transfers   Equipment used: Rolling walker (2 wheeled) Transfers: Sit to/from Stand Sit to Stand: Supervision         General transfer comment: close by due to weakness  Ambulation/Gait Ambulation/Gait assistance: Min assist Gait Distance (Feet): 88 Feet Assistive device: Rolling walker (2 wheeled) Gait Pattern/deviations: Step-through pattern;Decreased stride length Gait velocity: decr   General Gait Details: multiple stop and rest breaks for dyspnea 4/4, SPO2 99% on 3 L [place]    Stairs             Wheelchair Mobility    Modified Rankin (Stroke Patients Only)       Balance   Sitting-balance support: Feet supported Sitting  balance-Leahy Scale: Good     Standing balance support: Bilateral upper extremity supported;During functional activity Standing balance-Leahy Scale: Fair                              Cognition   Behavior During Therapy: WFL for tasks assessed/performed;Anxious                                   General Comments: expresses concern for going to SNF, does not want just any      Exercises      General Comments        Pertinent Vitals/Pain Faces Pain Scale: Hurts even more Pain Location: chest with deep breaths Pain Descriptors / Indicators: Constant;Discomfort;Grimacing Pain Intervention(s): Monitored during session    Home Living                      Prior Function            PT Goals (current goals can now be found in the care plan section)      Frequency    Min 2X/week      PT Plan Frequency needs to be updated    Co-evaluation              AM-PAC PT "6 Clicks" Mobility   Outcome Measure    Help needed moving from lying on your back to sitting on the side of  a flat bed without using bedrails?: A Little Help needed moving to and from a bed to a chair (including a wheelchair)?: A Little Help needed standing up from a chair using your arms (e.g., wheelchair or bedside chair)?: A Little Help needed to walk in hospital room?: A Little Help needed climbing 3-5 steps with a railing? : A Lot 6 Click Score: 14    End of Session Equipment Utilized During Treatment: Oxygen Activity Tolerance: Patient limited by fatigue;Treatment limited secondary to medical complications (Comment) Patient left: in chair;with call bell/phone within reach Nurse Communication: Mobility status PT Visit Diagnosis: Other abnormalities of gait and mobility (R26.89);Muscle weakness (generalized) (M62.81);Pain     Time: 0940-1008 PT Time Calculation (min) (ACUTE ONLY): 28 min  Charges:  $Gait Training: 23-37 mins                     [place]  Office [phone]    Claretha Cooper 05/26/2019, 10:16 AM

## 2019-05-27 DIAGNOSIS — E1159 Type 2 diabetes mellitus with other circulatory complications: Secondary | ICD-10-CM | POA: Diagnosis not present

## 2019-05-27 DIAGNOSIS — J1289 Other viral pneumonia: Secondary | ICD-10-CM | POA: Diagnosis not present

## 2019-05-27 DIAGNOSIS — R41841 Cognitive communication deficit: Secondary | ICD-10-CM | POA: Diagnosis not present

## 2019-05-27 DIAGNOSIS — M6281 Muscle weakness (generalized): Secondary | ICD-10-CM | POA: Diagnosis not present

## 2019-05-27 DIAGNOSIS — J9601 Acute respiratory failure with hypoxia: Secondary | ICD-10-CM | POA: Diagnosis not present

## 2019-05-27 DIAGNOSIS — U071 COVID-19: Secondary | ICD-10-CM | POA: Diagnosis not present

## 2019-05-27 DIAGNOSIS — I5189 Other ill-defined heart diseases: Secondary | ICD-10-CM | POA: Diagnosis not present

## 2019-05-27 DIAGNOSIS — R2681 Unsteadiness on feet: Secondary | ICD-10-CM | POA: Diagnosis not present

## 2019-05-27 DIAGNOSIS — Z7401 Bed confinement status: Secondary | ICD-10-CM | POA: Diagnosis not present

## 2019-05-27 DIAGNOSIS — R05 Cough: Secondary | ICD-10-CM | POA: Diagnosis not present

## 2019-05-27 DIAGNOSIS — R2689 Other abnormalities of gait and mobility: Secondary | ICD-10-CM | POA: Diagnosis not present

## 2019-05-27 DIAGNOSIS — M940 Chondrocostal junction syndrome [Tietze]: Secondary | ICD-10-CM | POA: Diagnosis not present

## 2019-05-27 DIAGNOSIS — R5381 Other malaise: Secondary | ICD-10-CM | POA: Diagnosis not present

## 2019-05-27 DIAGNOSIS — R278 Other lack of coordination: Secondary | ICD-10-CM | POA: Diagnosis not present

## 2019-05-27 DIAGNOSIS — Z23 Encounter for immunization: Secondary | ICD-10-CM | POA: Diagnosis not present

## 2019-05-27 DIAGNOSIS — E119 Type 2 diabetes mellitus without complications: Secondary | ICD-10-CM | POA: Diagnosis not present

## 2019-05-27 DIAGNOSIS — I10 Essential (primary) hypertension: Secondary | ICD-10-CM | POA: Diagnosis not present

## 2019-05-27 DIAGNOSIS — R008 Other abnormalities of heart beat: Secondary | ICD-10-CM | POA: Diagnosis not present

## 2019-05-27 DIAGNOSIS — M1049 Other secondary gout, multiple sites: Secondary | ICD-10-CM | POA: Diagnosis not present

## 2019-05-27 DIAGNOSIS — E1169 Type 2 diabetes mellitus with other specified complication: Secondary | ICD-10-CM | POA: Diagnosis not present

## 2019-05-27 DIAGNOSIS — R498 Other voice and resonance disorders: Secondary | ICD-10-CM | POA: Diagnosis not present

## 2019-05-27 DIAGNOSIS — E039 Hypothyroidism, unspecified: Secondary | ICD-10-CM | POA: Diagnosis not present

## 2019-05-27 DIAGNOSIS — R112 Nausea with vomiting, unspecified: Secondary | ICD-10-CM | POA: Diagnosis not present

## 2019-05-27 DIAGNOSIS — I5032 Chronic diastolic (congestive) heart failure: Secondary | ICD-10-CM | POA: Diagnosis not present

## 2019-05-27 DIAGNOSIS — G4733 Obstructive sleep apnea (adult) (pediatric): Secondary | ICD-10-CM | POA: Diagnosis not present

## 2019-05-27 DIAGNOSIS — M2550 Pain in unspecified joint: Secondary | ICD-10-CM | POA: Diagnosis not present

## 2019-05-27 DIAGNOSIS — I2510 Atherosclerotic heart disease of native coronary artery without angina pectoris: Secondary | ICD-10-CM | POA: Diagnosis not present

## 2019-05-27 DIAGNOSIS — E669 Obesity, unspecified: Secondary | ICD-10-CM | POA: Diagnosis not present

## 2019-05-27 DIAGNOSIS — E038 Other specified hypothyroidism: Secondary | ICD-10-CM | POA: Diagnosis not present

## 2019-05-27 LAB — BASIC METABOLIC PANEL
Anion gap: 9 (ref 5–15)
BUN: 35 mg/dL — ABNORMAL HIGH (ref 8–23)
CO2: 34 mmol/L — ABNORMAL HIGH (ref 22–32)
Calcium: 8.4 mg/dL — ABNORMAL LOW (ref 8.9–10.3)
Chloride: 92 mmol/L — ABNORMAL LOW (ref 98–111)
Creatinine, Ser: 0.56 mg/dL (ref 0.44–1.00)
GFR calc Af Amer: >60 mL/min (ref >60)
GFR calc non Af Amer: >60 mL/min (ref >60)
Glucose, Bld: 67 mg/dL — ABNORMAL LOW (ref 70–99)
Potassium: 4 mmol/L (ref 3.5–5.1)
Sodium: 135 mmol/L (ref 135–145)

## 2019-05-27 LAB — GLUCOSE, CAPILLARY
Glucose-Capillary: 53 mg/dL — ABNORMAL LOW (ref 70–99)
Glucose-Capillary: 78 mg/dL (ref 70–99)
Glucose-Capillary: 64 mg/dL — ABNORMAL LOW (ref 70–99)
Glucose-Capillary: 172 mg/dL — ABNORMAL HIGH (ref 70–99)

## 2019-05-27 LAB — D-DIMER, QUANTITATIVE: D-Dimer, Quant: 0.75 ug/mL-FEU — ABNORMAL HIGH (ref 0.00–0.50)

## 2019-05-27 MED ORDER — INSULIN GLARGINE 100 UNIT/ML ~~LOC~~ SOLN
15.0000 [IU] | Freq: Two times a day (BID) | SUBCUTANEOUS | Status: DC
  Administered 2019-05-27: 15 [IU] via SUBCUTANEOUS
  Filled 2019-05-27 (×2): qty 0.15

## 2019-05-27 MED ORDER — MENTHOL 3 MG MT LOZG: 1.0000 | LOZENGE | OROMUCOSAL | Status: DC | PRN

## 2019-05-27 MED ORDER — INFLUENZA VAC A&B SA ADJ QUAD 0.5 ML IM PRSY
0.5000 mL | PREFILLED_SYRINGE | INTRAMUSCULAR | Status: DC
  Filled 2019-05-27: qty 0.5

## 2019-05-27 MED ORDER — BENZONATATE 200 MG PO CAPS: 200.0000 mg | ORAL_CAPSULE | Freq: Three times a day (TID) | ORAL | 0 refills | Status: DC

## 2019-05-27 MED ORDER — GUAIFENESIN-DM 100-10 MG/5ML PO SYRP: 5.0000 mL | ORAL_SOLUTION | ORAL | 0 refills | Status: DC | PRN

## 2019-05-27 NOTE — Discharge Summary (Signed)
Physician Discharge Summary  Teresa [name] [id number] DOB: 03-Jan-1946 DOA: 05/16/2019  PCP: [name], MD  Admit date: 05/16/2019 Discharge date: 05/27/2019  Admitted From: Home Disposition: SNF   Recommendations for Outpatient Follow-up:  1. Follow up with PCP in 1-2 weeks 2. Please obtain CMP/CBC in one week 3. Continue monitoring blood sugars. Returned to oral antihyperglycemic medications at discharge after requiring significant insulin doses while on steroids.   Home Health: N/A Equipment/Devices: Per SNF; 2L O2 Discharge Condition: Stable CODE STATUS: Full Diet recommendation: Carb-modified, heart healthy  Brief/Interim Summary: Teresa [name] is a 73 y.o. female with a history of chronic HFpEF, CAD, T2DM, and HTN who presented to the ED 10/10 with multiple complaints including GI (N/V/D), pulmonary (shortness of breath, cough), and constitutional (malaise, fatigue, generalized weakness) that began 10/3 found to have covid-19 on 10/5. She required supplemental oxygen at 3LPM and had bilateral CXR infiltrates, so she was admitted, started on steroids and remdesivir. Despite completing remdesivir, hypoxia continued and pleuritic chest pain developed. D-dimer normalized and inflammatory markers continued to improve. Troponin remained normal and ECG unchanged. Due to steroid-induced hyperglycemia, significant titration of basal-bolus insulin was required. With improvement in hypoxia and respiratory effort, oxygen has been decreased to 2LPM and steroids have been tapered off. Due to deconditioning related to severe covid-19 pneumonia, PT and OT recommend short term rehabilitation to regain strength and return safely home. 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  Discharge Diagnoses:  Principal Problem:   Intractable nausea and vomiting Active Problems:   Hypothyroidism   Diabetes  mellitus type II, non insulin dependent (HCC)   Essential hypertension   CAD (coronary artery disease)   Pneumonia due to COVID-19 virus   Chronic diastolic CHF (congestive heart failure) (HCC)  Acute hypoxic respiratory failure due to covid-19 pneumonia:  - Wean oxygen as able, stable on 2L O2. - Continue precautions. - CRP has improved, steroid-induced hyperglycemia puts at risk of adverse outcome. So stopped steroids 10/19.  - Continue incentive spirometry, and OOB with PT/OT daily at SNF. - Pt is Jehovah's Witness and declined CCP.  - Completed remdesivir 10/10 - 10/14  - Regarding hemoptysis, this is stable/improved, no anemia. Consider bronchoscopy if this persists after resolution of hypoxia.  Chest pain: Improved. No evidence of ACS w/unchanged ECG, negative troponin, d-dimer trending down, has been on anticoagulation for VTE ppx. CXR shows some improvement.  - Continue to treat supportively - Pt's allergies limit analgesic options.  Covid-19 gastroenteritis: Improving clinically.  - Symptomatic management  Nasal congestion: Improving. - Continue supportive measures  Chronic HFpEF, HTN:  - Continue home lasix. Was given increased dose of lasix while admitted with evidence of contraction alkalosis, appearing euvolemic with BNP 92 (wnl).  - Continue anti-hypertensives  T2DM uncontrolled with hyperglycemia:. On 4 oral medications and weekly semaglutide as outpatient, HbA1c 7.5%. Severe steroid-induced hyperglycemia while admitted. At one point was requiring 230units of insulin over 24 hour period. Since stopping steroids, CBGs have precipitously declined, so insulin is decreased dramatically, planning to return to oral options only at discharge.   Hypokalemia: Mild, resolved. - Monitor in 1 week  Hypomagnesemia:  - Monitoring. Resolved.  Hypothyroidism:  - Continue synthroid  Obesity: BMI 35.  - Weight loss recommended over long term.   Headache: Secondary to  cough.  - Continue symptomatic management with antitussives. Analgesic options limited by allergies.  Vulvovaginal candidiasis: Due to hyperglycemia most likely.  - Diflucan given po x1, topical nystatin can be continued if needed.   Gout: Quiescent.  - Continue allopurinol. Colchicine continued as prn.  Discharge Instructions  Allergies as of 05/27/2019      Reactions   Bacitracin-neomycin-polymyxin    Clarithromycin Other (See Comments), Nausea Only, Nausea And Vomiting   Codeine    Other reaction(s): Hallucination   Dilaudid  [hydromorphone Hcl] Nausea And Vomiting   Iodine    Iohexol     Desc: PT HAS HIVES/ITCHING   Neomycin-bacitracin Zn-polymyx    Tamiflu  [oseltamivir Phosphate]    Other reaction(s): Abdominal Pain, Vomiting   Zolpidem Nausea And Vomiting, Other (See Comments)   Hallucinations   Benzalkonium Chloride Itching, Rash, Swelling   Lidocaine Hcl Itching, Rash, Swelling   Morphine Nausea Only, Rash, Nausea And Vomiting   Tape Itching, Rash   Tapentadol Rash      Medication List    TAKE these medications   acetaminophen 500 MG tablet Commonly known as: TYLENOL Take 500 mg by mouth every 6 (six) hours as needed for mild pain or headache.   allopurinol 100 MG tablet Commonly known as: ZYLOPRIM TAKE 1 TABLET BY MOUTH EVERY DAY   atorvastatin 10 MG tablet Commonly known as: LIPITOR TAKE 1 TABLET BY MOUTH EVERY DAY AT BEDTIME   benzonatate 200 MG capsule Commonly known as: TESSALON Take 1 capsule (200 mg total) by mouth 3 (three) times daily.   colchicine 0.6 MG tablet Take 0.6 mg by mouth daily as needed (for gout flare-ups).   FreeStyle Libre 14 Day Sensor Misc USE EVERY 14 DAYS AS DIRECTED   furosemide 40 MG tablet Commonly known as: LASIX TAKE 1 TABLET BY MOUTH EVERY DAY   gabapentin 400 MG capsule Commonly known as: NEURONTIN Take 1 capsule (400 mg total) by mouth 3 (three) times daily.   glimepiride 4 MG tablet Commonly known as:  AMARYL TAKE 1 TABLET (4 MG TOTAL) BY MOUTH 2 (TWO) TIMES DAILY.   glucose blood test strip Check blood sugar daily. Use as instructed   guaiFENesin-dextromethorphan 100-10 MG/5ML syrup Commonly known as: ROBITUSSIN DM Take 5 mLs by mouth every 4 (four) hours as needed for cough.   Jardiance 25 MG Tabs tablet Generic drug: empagliflozin TAKE 25 MG BY MOUTH DAILY.   levothyroxine 88 MCG tablet Commonly known as: SYNTHROID TAKE 1 TABLET BY MOUTH EVERY DAY What changed: when to take this   losartan 25 MG tablet Commonly known as: COZAAR Take 1 tablet (25 mg total) by mouth daily.   menthol-cetylpyridinium 3 MG lozenge Commonly known as: CEPACOL Take 1 lozenge (3 mg total) by mouth as needed for sore throat.   metFORMIN 1000 MG tablet Commonly known as: GLUCOPHAGE TAKE 1 TABLET BY MOUTH TWICE A DAY   nystatin cream Commonly known as: MYCOSTATIN Apply 1 application topically 2 (two) times daily. What changed:   when to take this  reasons to take this   ondansetron 4 MG tablet Commonly known as: ZOFRAN Take 1 tablet (4 mg total) by mouth every 8 (eight) hours as needed for nausea or vomiting.   ONE TOUCH LANCETS Misc 1 each by Does not apply route daily.   pioglitazone 30 MG tablet Commonly known as: ACTOS TAKE 1 TABLET BY MOUTH EVERY DAY   Semaglutide(0.25 or 0.5MG /DOS) 2 MG/1.5ML Sopn Commonly known as: Ozempic (0.25 or 0.5 MG/DOSE) Inject 0.5 Syringes into the skin once a week.  Follow-up Information    [name], MD. Schedule an appointment as soon as possible for a visit.   Specialty: Family Medicine Contact information: [address] [place] [address] [phone]          Allergies  Allergen Reactions  . Bacitracin-Neomycin-Polymyxin   . Clarithromycin Other (See Comments), Nausea Only and Nausea And Vomiting  . Codeine     Other reaction(s): Hallucination  . Dilaudid  [Hydromorphone Hcl] Nausea And Vomiting   . Iodine   . Iohexol      Desc: PT HAS HIVES/ITCHING   . Neomycin-Bacitracin Zn-Polymyx   . Tamiflu  [Oseltamivir Phosphate]     Other reaction(s): Abdominal Pain, Vomiting  . Zolpidem Nausea And Vomiting and Other (See Comments)    Hallucinations  . Benzalkonium Chloride Itching, Rash and Swelling  . Lidocaine Hcl Itching, Rash and Swelling  . Morphine Nausea Only, Rash and Nausea And Vomiting  . Tape Itching and Rash  . Tapentadol Rash    Consultations:  None  Procedures/Studies: Dg Chest Port 1 View  Result Date: 05/25/2019 CLINICAL DATA:  COVID-19 positive. EXAM: PORTABLE CHEST 1 VIEW COMPARISON:  May 18, 2019 FINDINGS: Pulmonary infiltrates, left greater than right, are stable on the left and mildly improved on the right in the interval. The right PICC line terminates in the central SVC. No pneumothorax. No other acute abnormalities or changes. IMPRESSION: 1. Persistent infiltrates, stable on the left and improved on the right. The new right PICC line is in good position. Electronically Signed   By: Dorise Bullion III M.D   On: 05/25/2019 15:01   Dg Chest Port 1 View  Result Date: 05/18/2019 CLINICAL DATA:  COVID+ AM Port @ [place]. HTN, DM, dyspnea, sleep apnea EXAM: PORTABLE CHEST 1 VIEW COMPARISON:  Radiograph 05/16/2019 FINDINGS: Mild enlarged cardiac silhouette. Decrease in lung volumes compared to prior. Mild peripheral airspace opacities again noted. No pneumothorax. IMPRESSION: 1. Decreased lung volumes. 2. Stable patchy peripheral airspace densities. Electronically Signed   By: Suzy Bouchard M.D.   On: 05/18/2019 06:38   Dg Chest Portable 1 View  Result Date: 05/16/2019 CLINICAL DATA:  Chest pain, shortness of breath, vomiting and diarrhea. COVID 19 positive. EXAM: PORTABLE CHEST 1 VIEW COMPARISON:  PA and lateral chest 08/22/2017. FINDINGS: Patchy bilateral airspace disease is worse on the left. No pneumothorax or pleural effusion. Heart size is normal. No acute  or focal bony abnormality. IMPRESSION: Patchy bilateral airspace disease consistent with pneumonia. Viral/atypical infection is within the differential. Electronically Signed   By: Inge Rise M.D.   On: 05/16/2019 17:21   [place] Ekg Site Rite  Result Date: 05/19/2019 If Site Rite image not attached, placement could not be confirmed due to current cardiac rhythm.  Subjective: Continues to feel short of breath with pleuritic chest discomfort and cough. No fevers, no change in symptoms over past 24 hours, though has been decreased to 2L O2  Discharge Exam: Vitals:   05/27/19 0455 05/27/19 0757  BP: 133/74 119/63  Pulse: 62 63  Resp: 16 18  Temp: 98.1 F (36.7 C) 97.8 F (36.6 C)  SpO2:  99%   General: Pleasant 73yo F in no distress sitting on bedside chair. Cardiovascular: RRR, S1/S2 +, no rubs, no gallops Respiratory: Normal rate and effort, crackles nearly gone bilaterally. No wheezes, aeration normal. 99-100% on 2L O2 during encounter. Abdominal: Soft, NT, ND, bowel sounds + Extremities: Trace edema, no cyanosis  Labs: BNP (last 3 results) Recent  Labs    05/23/19 0523  BNP 0000000   Basic Metabolic Panel: Recent Labs  Lab 05/21/19 0310 05/22/19 0440 05/23/19 0523 05/24/19 0610 05/25/19 0552 05/26/19 0500 05/27/19 0500  NA 136 137 136 138 134* 135 135  K 3.5 3.7 3.8 3.8 4.3 4.4 4.0  CL 96* 97* 95* 95* 93* 92* 92*  CO2 29 28 30  33* 30 30 34*  GLUCOSE 301* 191* 218* 113* 166* 155* 67*  BUN 23 27* 29* 30* 32* 38* 35*  CREATININE 0.52 0.54 0.57 0.55 0.55 0.61 0.56  CALCIUM 8.0* 8.4* 8.6* 8.7* 9.0 8.8* 8.4*  MG 1.6* 1.7  --   --   --   --   --    Liver Function Tests: Recent Labs  Lab 05/22/19 0440 05/23/19 0523 05/24/19 0610  AST 25 24 28   ALT 23 24 26   ALKPHOS 77 80 67  BILITOT 0.3 0.4 0.6  PROT 6.3* 6.2* 6.4*  ALBUMIN 2.9* 2.9* 3.0*   CBC: Recent Labs  Lab 05/24/19 0610 05/25/19 0552 05/26/19 0500  WBC 14.1* 16.7* 18.0*  NEUTROABS 9.9*  --  14.6*   HGB 13.2 12.9 13.3  HCT 40.0 39.1 39.9  MCV 88.9 89.5 89.5  PLT 308 311 315   CBG: Recent Labs  Lab 05/25/19 2059 05/26/19 0740 05/26/19 1156 05/26/19 2028 05/27/19 0756  GLUCAP 257* 120* 325* 168* 64*   D-Dimer Recent Labs    05/26/19 0500 05/27/19 0500  DDIMER 0.73* 0.75*   Time coordinating discharge: Approximately 40 minutes  Patrecia Pour, MD  [facility] 05/27/2019, 10:24 AM

## 2019-05-27 NOTE — Care Management Important Message (Signed)
Important Message  Patient Details  Name: Teresa [name] MRN: [id number] Date of Birth: 09/06/1945   Medicare Important Message Given:  Yes - Important Message mailed due to current National Emergency   Verbal consent obtained due to current National Emergency  Relationship to patient: Child Contact Name: Maddalena Horng Call Date: 05/27/19  Time: [place] Phone: [phone] Outcome: Spoke with contact Important Message mailed to: ([place] [id number])      Orbie Pyo 05/27/2019, 3:48 PM

## 2019-05-27 NOTE — Discharge Instructions (Signed)

## 2019-05-27 NOTE — TOC Transition Note (Signed)
Transition of Care [facility]) - CM/SW Discharge Note   Patient Details  Name: Teresa [name] MRN: [id number] Date of Birth: 12/24/1945  [facility] of [facility]) CM/SW Contact:  Weston Anna, LCSW Phone Number: 05/27/2019, 11:47 AM   Clinical Narrative:     Patient set to discharge to [facility] today- please call report to [phone]. Patient aware of discharge. PTAR scheduled for 1PM pick up.   Final next level of care: Skilled Nursing Facility Barriers to Discharge: No Barriers Identified   Patient Goals and CMS Choice        Discharge Placement              Patient chooses bed at: [facility] Patient to be transferred to facility by: [place] Name of family member notified: patient notified Patient and family notified of of transfer: 05/27/19  Discharge Plan and Services                                     Social Determinants of Health (SDOH) Interventions     Readmission Risk Interventions No flowsheet data found.

## 2019-05-27 NOTE — TOC Progression Note (Signed)
Transition of Care [facility]) - Progression Note    Patient Details  Name: Teresa [name] MRN: [id number] Date of Birth: [birth year]  [facility]) CM/SW Sharon, LCSW Phone Number: 05/27/2019, 9:34 AM  Clinical Narrative:     Patient received authorization to go to [facility] Josem Kaufmann number is [phone] good until 10/26). PASRR number is still pending at this time- all needed information has been submitted.   Expected Discharge Plan: Skilled Nursing Facility Barriers to Discharge: [facility])  Expected Discharge Plan and Services Expected Discharge Plan: [place]         Expected Discharge Date: 05/27/19                                     Social Determinants of Health (SDOH) Interventions    Readmission Risk Interventions No flowsheet data found.

## 2019-05-28 DIAGNOSIS — E038 Other specified hypothyroidism: Secondary | ICD-10-CM | POA: Diagnosis not present

## 2019-05-28 DIAGNOSIS — M1049 Other secondary gout, multiple sites: Secondary | ICD-10-CM | POA: Diagnosis not present

## 2019-05-28 DIAGNOSIS — R05 Cough: Secondary | ICD-10-CM | POA: Diagnosis not present

## 2019-05-28 DIAGNOSIS — J1289 Other viral pneumonia: Secondary | ICD-10-CM | POA: Diagnosis not present

## 2019-05-28 DIAGNOSIS — I5189 Other ill-defined heart diseases: Secondary | ICD-10-CM | POA: Diagnosis not present

## 2019-05-28 DIAGNOSIS — E1159 Type 2 diabetes mellitus with other circulatory complications: Secondary | ICD-10-CM | POA: Diagnosis not present

## 2019-05-28 DIAGNOSIS — M940 Chondrocostal junction syndrome [Tietze]: Secondary | ICD-10-CM | POA: Diagnosis not present

## 2019-05-28 DIAGNOSIS — U071 COVID-19: Secondary | ICD-10-CM | POA: Diagnosis not present

## 2019-05-28 DIAGNOSIS — I10 Essential (primary) hypertension: Secondary | ICD-10-CM | POA: Diagnosis not present

## 2019-05-28 DIAGNOSIS — I2510 Atherosclerotic heart disease of native coronary artery without angina pectoris: Secondary | ICD-10-CM | POA: Diagnosis not present

## 2019-05-29 DIAGNOSIS — E669 Obesity, unspecified: Secondary | ICD-10-CM | POA: Diagnosis not present

## 2019-05-29 DIAGNOSIS — I5032 Chronic diastolic (congestive) heart failure: Secondary | ICD-10-CM | POA: Diagnosis not present

## 2019-05-29 DIAGNOSIS — E1169 Type 2 diabetes mellitus with other specified complication: Secondary | ICD-10-CM | POA: Diagnosis not present

## 2019-05-29 DIAGNOSIS — G4733 Obstructive sleep apnea (adult) (pediatric): Secondary | ICD-10-CM | POA: Diagnosis not present

## 2019-05-29 DIAGNOSIS — J9601 Acute respiratory failure with hypoxia: Secondary | ICD-10-CM | POA: Diagnosis not present

## 2019-06-04 DIAGNOSIS — I5032 Chronic diastolic (congestive) heart failure: Secondary | ICD-10-CM | POA: Diagnosis not present

## 2019-06-04 DIAGNOSIS — I2510 Atherosclerotic heart disease of native coronary artery without angina pectoris: Secondary | ICD-10-CM | POA: Diagnosis not present

## 2019-06-04 DIAGNOSIS — U071 COVID-19: Secondary | ICD-10-CM | POA: Diagnosis not present

## 2019-06-04 DIAGNOSIS — E038 Other specified hypothyroidism: Secondary | ICD-10-CM | POA: Diagnosis not present

## 2019-06-04 DIAGNOSIS — R008 Other abnormalities of heart beat: Secondary | ICD-10-CM | POA: Diagnosis not present

## 2019-06-05 DIAGNOSIS — I10 Essential (primary) hypertension: Secondary | ICD-10-CM | POA: Diagnosis not present

## 2019-06-05 DIAGNOSIS — R008 Other abnormalities of heart beat: Secondary | ICD-10-CM | POA: Diagnosis not present

## 2019-06-05 DIAGNOSIS — J1289 Other viral pneumonia: Secondary | ICD-10-CM | POA: Diagnosis not present

## 2019-06-05 DIAGNOSIS — I2510 Atherosclerotic heart disease of native coronary artery without angina pectoris: Secondary | ICD-10-CM | POA: Diagnosis not present

## 2019-06-17 DIAGNOSIS — I10 Essential (primary) hypertension: Secondary | ICD-10-CM | POA: Diagnosis not present

## 2019-06-17 DIAGNOSIS — I2510 Atherosclerotic heart disease of native coronary artery without angina pectoris: Secondary | ICD-10-CM | POA: Diagnosis not present

## 2019-06-17 DIAGNOSIS — I5189 Other ill-defined heart diseases: Secondary | ICD-10-CM | POA: Diagnosis not present

## 2019-06-17 DIAGNOSIS — E038 Other specified hypothyroidism: Secondary | ICD-10-CM | POA: Diagnosis not present

## 2019-06-17 DIAGNOSIS — U071 COVID-19: Secondary | ICD-10-CM | POA: Diagnosis not present

## 2019-06-17 DIAGNOSIS — E1159 Type 2 diabetes mellitus with other circulatory complications: Secondary | ICD-10-CM | POA: Diagnosis not present

## 2019-06-17 DIAGNOSIS — R008 Other abnormalities of heart beat: Secondary | ICD-10-CM | POA: Diagnosis not present

## 2019-06-20 DIAGNOSIS — I2510 Atherosclerotic heart disease of native coronary artery without angina pectoris: Secondary | ICD-10-CM | POA: Diagnosis not present

## 2019-06-20 DIAGNOSIS — E1159 Type 2 diabetes mellitus with other circulatory complications: Secondary | ICD-10-CM | POA: Diagnosis not present

## 2019-06-20 DIAGNOSIS — I5032 Chronic diastolic (congestive) heart failure: Secondary | ICD-10-CM | POA: Diagnosis not present

## 2019-06-20 DIAGNOSIS — R008 Other abnormalities of heart beat: Secondary | ICD-10-CM | POA: Diagnosis not present

## 2019-06-20 DIAGNOSIS — U071 COVID-19: Secondary | ICD-10-CM | POA: Diagnosis not present

## 2019-06-20 DIAGNOSIS — J9601 Acute respiratory failure with hypoxia: Secondary | ICD-10-CM | POA: Diagnosis not present

## 2019-06-24 ENCOUNTER — Ambulatory Visit (INDEPENDENT_AMBULATORY_CARE_PROVIDER_SITE_OTHER): Payer: PPO | Admitting: Adult Health

## 2019-06-24 ENCOUNTER — Ambulatory Visit: Payer: Self-pay | Admitting: Adult Health

## 2019-06-24 ENCOUNTER — Ambulatory Visit
Admission: RE | Admit: 2019-06-24 | Discharge: 2019-06-24 | Disposition: A | Discharge status: Home / Self Care | Payer: PPO | Source: Ambulatory Visit | Attending: Adult Health | Admitting: Adult Health

## 2019-06-24 ENCOUNTER — Other Ambulatory Visit: Payer: Self-pay

## 2019-06-24 ENCOUNTER — Other Ambulatory Visit: Payer: Self-pay | Admitting: Adult Health

## 2019-06-24 ENCOUNTER — Encounter: Payer: Self-pay | Admitting: Adult Health

## 2019-06-24 VITALS — HR 86 | Temp 96.8°F | Resp 18 | Wt 194.2 lb

## 2019-06-24 DIAGNOSIS — J431 Panlobular emphysema: Secondary | ICD-10-CM

## 2019-06-24 DIAGNOSIS — Z09 Encounter for follow-up examination after completed treatment for conditions other than malignant neoplasm: Secondary | ICD-10-CM | POA: Insufficient documentation

## 2019-06-24 DIAGNOSIS — Z20828 Contact with and (suspected) exposure to other viral communicable diseases: Secondary | ICD-10-CM

## 2019-06-24 DIAGNOSIS — R05 Cough: Secondary | ICD-10-CM | POA: Insufficient documentation

## 2019-06-24 DIAGNOSIS — R059 Cough, unspecified: Secondary | ICD-10-CM

## 2019-06-24 DIAGNOSIS — R0602 Shortness of breath: Secondary | ICD-10-CM

## 2019-06-24 DIAGNOSIS — R0902 Hypoxemia: Secondary | ICD-10-CM | POA: Insufficient documentation

## 2019-06-24 DIAGNOSIS — Z8619 Personal history of other infectious and parasitic diseases: Secondary | ICD-10-CM

## 2019-06-24 DIAGNOSIS — Z8616 Personal history of COVID-19: Secondary | ICD-10-CM | POA: Insufficient documentation

## 2019-06-24 NOTE — Progress Notes (Addendum)
Patient: Teresa [name] Female    DOB: [birth year]   73 y.o.   MRN: [id number] Visit Date: 06/24/2019  Today's Provider: Marcille Buffy, FNP   Chief Complaint  Patient presents with  . Shortness of Breath   Subjective:     Shortness of Breath This is a recurrent problem. The current episode started more than 1 month ago. The problem occurs constantly. The problem has been unchanged. Pertinent negatives include no abdominal pain, chest pain, claudication, coryza, ear pain, fever, headaches, hemoptysis, leg pain, leg swelling, neck pain, orthopnea, PND, rash, rhinorrhea, sore throat, sputum production, swollen glands, syncope, vomiting or wheezing.  Patients O2 saturation at rest in office was 93% After patient began walking down hallway oxygen dropped to 87, patient was unable to successfully walk around hallway, patient stated she felt light headed and weak while ambulating the distance.  Emergency room Covid 19 positive on 05/16/2019 with shortness of breath, intractable vomiting, nausea, and fever was admitted at that time and discharged on 05/27/19.   Here today to be evaluated for home oxygen.   Vomiting has resolved. Fever has resolved.  Shortness of breath has improved but is still present especially with deep breaths or heavy exertion since admission to hospital.Denies any  leg pain. Denies any wheezing, stridor or any retractions. She still has intermittent nausea without any abdominal pain, she denies need for refill on Zofran as she has it at home, it relieves it at times.   She is continuing to monitor blood glucose was 203 this morning and she has restarted her diabetic medications.  She did rehab in [place] post hospital discharge  and was on 2L of oxygen at that time. She continues to need oxygen likely due to the deconditioning related to Covid 19.  She sees Dr. Clayborn Bigness cardiology on tomorrow  06/25/2019.Patient  denies any fever, body aches,chills, rash,  vomiting, or diarrhea.   Last chest x ray as below:  PORTABLE CHEST 1 VIEW  COMPARISON:  May 18, 2019  FINDINGS: Pulmonary infiltrates, left greater than right, are stable on the left and mildly improved on the right in the interval. The right PICC line terminates in the central SVC. No pneumothorax. No other acute abnormalities or changes.  IMPRESSION: 1. Persistent infiltrates, stable on the left and improved on the right.   Electronically Signed   By: Dorise Bullion III M.D   On: 05/25/2019 15:01    Allergies  Allergen Reactions  . Bacitracin-Neomycin-Polymyxin   . Clarithromycin Other (See Comments), Nausea Only and Nausea And Vomiting  . Codeine     Other reaction(s): Hallucination  . Dilaudid  [Hydromorphone Hcl] Nausea And Vomiting  . Iodine   . Iohexol      Desc: PT HAS HIVES/ITCHING   . Neomycin-Bacitracin Zn-Polymyx   . Tamiflu  [Oseltamivir Phosphate]     Other reaction(s): Abdominal Pain, Vomiting  . Zolpidem Nausea And Vomiting and Other (See Comments)    Hallucinations  . Benzalkonium Chloride Itching, Rash and Swelling  . Lidocaine Hcl Itching, Rash and Swelling  . Morphine Nausea Only, Rash and Nausea And Vomiting  . Tape Itching and Rash  . Tapentadol Rash     Current Outpatient Medications:  .  acetaminophen (TYLENOL) 500 MG tablet, Take 500 mg by mouth every 6 (six) hours as needed for mild pain or headache., Disp: , Rfl:  .  allopurinol (ZYLOPRIM) 100 MG tablet, TAKE 1 TABLET BY MOUTH EVERY DAY, Disp: 90  tablet, Rfl: 3 .  atorvastatin (LIPITOR) 10 MG tablet, TAKE 1 TABLET BY MOUTH EVERY DAY AT BEDTIME (Patient taking differently: Take 10 mg by mouth at bedtime. ), Disp: 90 tablet, Rfl: 3 .  colchicine 0.6 MG tablet, Take 0.6 mg by mouth daily as needed (for gout flare-ups). , Disp: , Rfl:  .  Continuous Blood Gluc Sensor (FREESTYLE LIBRE 14 DAY SENSOR) MISC, USE EVERY 14 DAYS AS DIRECTED, Disp: 2 each, Rfl: 12 .  furosemide (LASIX)  40 MG tablet, TAKE 1 TABLET BY MOUTH EVERY DAY, Disp: 90 tablet, Rfl: 3 .  gabapentin (NEURONTIN) 400 MG capsule, Take 1 capsule (400 mg total) by mouth 3 (three) times daily., Disp: 270 capsule, Rfl: 3 .  glimepiride (AMARYL) 4 MG tablet, TAKE 1 TABLET (4 MG TOTAL) BY MOUTH 2 (TWO) TIMES DAILY., Disp: 180 tablet, Rfl: 3 .  glucose blood test strip, Check blood sugar daily. Use as instructed, Disp: 100 each, Rfl: 12 .  guaiFENesin-dextromethorphan (ROBITUSSIN DM) 100-10 MG/5ML syrup, Take 5 mLs by mouth every 4 (four) hours as needed for cough., Disp: 118 mL, Rfl: 0 .  JARDIANCE 25 MG TABS tablet, TAKE 25 MG BY MOUTH DAILY., Disp: 90 tablet, Rfl: 3 .  levothyroxine (SYNTHROID, LEVOTHROID) 88 MCG tablet, TAKE 1 TABLET BY MOUTH EVERY DAY (Patient taking differently: Take 88 mcg by mouth daily before breakfast. ), Disp: 90 tablet, Rfl: 3 .  losartan (COZAAR) 25 MG tablet, Take 1 tablet (25 mg total) by mouth daily., Disp: 90 tablet, Rfl: 3 .  menthol-cetylpyridinium (CEPACOL) 3 MG lozenge, Take 1 lozenge (3 mg total) by mouth as needed for sore throat., Disp: , Rfl:  .  metFORMIN (GLUCOPHAGE) 1000 MG tablet, TAKE 1 TABLET BY MOUTH TWICE A DAY, Disp: 180 tablet, Rfl: 3 .  nystatin cream (MYCOSTATIN), Apply 1 application topically 2 (two) times daily. (Patient taking differently: Apply 1 application topically 2 (two) times daily as needed (for diabetic yeast reactions.). ), Disp: 30 g, Rfl: 0 .  ondansetron (ZOFRAN) 4 MG tablet, Take 1 tablet (4 mg total) by mouth every 8 (eight) hours as needed for nausea or vomiting., Disp: 45 tablet, Rfl: 0 .  ONE TOUCH LANCETS MISC, 1 each by Does not apply route daily., Disp: 30 each, Rfl: 12 .  pioglitazone (ACTOS) 30 MG tablet, TAKE 1 TABLET BY MOUTH EVERY DAY (Patient taking differently: Take 30 mg by mouth daily. ), Disp: 90 tablet, Rfl: 3 .  Semaglutide,0.25 or 0.5MG /DOS, (OZEMPIC, 0.25 OR 0.5 MG/DOSE,) 2 MG/1.5ML SOPN, Inject 0.5 Syringes into the skin once a  week., Disp: 1.5 mL, Rfl: 11 .  benzonatate (TESSALON) 200 MG capsule, Take 1 capsule (200 mg total) by mouth 3 (three) times daily. (Patient not taking: Reported on 06/24/2019), Disp: , Rfl: 0  Review of Systems  Constitutional: Positive for fatigue. Negative for activity change, appetite change, chills, diaphoresis, fever and unexpected weight change.  HENT: Negative.  Negative for ear pain, rhinorrhea and sore throat.   Respiratory: Positive for cough (occasional non productive ), chest tightness (at times with deep breathing since Covid diagnosis. ) and shortness of breath. Negative for apnea, hemoptysis, sputum production, wheezing and stridor.   Cardiovascular: Negative for chest pain, palpitations, orthopnea, claudication, leg swelling, syncope and PND.  Gastrointestinal: Positive for nausea. Negative for abdominal distention, abdominal pain, anal bleeding, blood in stool, constipation, diarrhea, rectal pain and vomiting.  Genitourinary: Negative.   Musculoskeletal: Negative.  Negative for neck pain.  Skin: Negative.  Negative  for rash.  Neurological: Positive for dizziness (with prolonged exertion oxygen desaturation ) and light-headedness (with prolonged exertion- oxygen desaturation ). Negative for tremors, seizures, syncope, facial asymmetry, speech difficulty, weakness, numbness and headaches.  Hematological: Negative for adenopathy. Does not bruise/bleed easily.  Psychiatric/Behavioral: Negative.     Social History   Tobacco Use  . Smoking status: Never Smoker  . Smokeless tobacco: Never Used  Substance Use Topics  . Alcohol use: No    Alcohol/week: 0.0 standard drinks      Objective:   Pulse 86   Temp (!) 96.8 F (36 C) (Oral)   Resp (!) 22   Wt 194 lb 3.2 oz (88.1 kg)   BMI 35.52 kg/m  Vitals:   06/24/19 1342  Pulse: 86  Resp: (!) 22  Temp: (!) 96.8 F (36 C)  TempSrc: Oral  Weight: 194 lb 3.2 oz (88.1 kg)  Body mass index is 35.52 kg/m.   Physical Exam  Vitals signs reviewed.  Constitutional:      General: She is not in acute distress.    Appearance: She is well-developed. She is not ill-appearing, toxic-appearing or diaphoretic.     Comments: Pale skin   HENT:     Head: Normocephalic and atraumatic.  Eyes:     Extraocular Movements: Extraocular movements intact.     Pupils: Pupils are equal, round, and reactive to light.  Neck:     Musculoskeletal: Normal range of motion and neck supple.     Vascular: No JVD.  Cardiovascular:     Rate and Rhythm: Normal rate and regular rhythm.     Pulses: No decreased pulses.     Heart sounds: No murmur. No friction rub. No gallop.   Pulmonary:     Effort: Pulmonary effort is normal. No accessory muscle usage.     Breath sounds: Normal breath sounds. No decreased breath sounds, wheezing, rhonchi or rales.     Comments: No retractions  Effort normal at rest.  Increased effort with mild dyspnea  with prolonged ambulation likely due to oxygen desaturations and deconditioning post covid.  Chest:     Chest wall: No mass or tenderness.  Abdominal:     Palpations: Abdomen is soft.     Tenderness: There is no abdominal tenderness.  Musculoskeletal: Normal range of motion.  Lymphadenopathy:     Cervical: No cervical adenopathy.  Skin:    General: Skin is warm and dry.     Capillary Refill: Capillary refill takes less than 2 seconds.     Findings: No erythema or rash.  Neurological:     General: No focal deficit present.     Mental Status: She is alert and oriented to person, place, and time.     Cranial Nerves: No cranial nerve deficit.     Motor: No weakness.  Psychiatric:        Mood and Affect: Mood normal.        Behavior: Behavior normal.      No results found for any visits on 06/24/19.     Assessment & Plan     1. Panlobular emphysema ([place]) Continue current treatment plan. Follow closely due to Covid 19 history.   2. Cough/ shortness of breath with Covid 19 exposure/ history.  Only occasional non productive, denies need for medication. Chest x ray follow up at [facility] ordered  Recheck CBC and CMP.   3. Oxygen desaturation Orders Placed This Encounter  Procedures  . For home use only DME oxygen  .  DG Chest 2 View  . CBC with Differential/Platelet [id number]  . CMP 322000    4. Encounter for examination following treatment at hospital 5. History of Covid 19 positive Post follow up review of systems and progress post discharge.   Return in about 2 weeks (around 07/08/2019), or 2 weeks with Dr. Rosanna Randy for follow up, for at any time for any worsening symptoms, Go to Emergency room/ urgent care if worse.  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 Advised patient call the office or your primary care doctor for an appointment if no improvement within 72 hours or if any symptoms change or worsen at any time  Advised ER or urgent Care if after hours or on weekend. Call 911 for emergency symptoms at any time.Patinet verbalized understanding of all instructions given/reviewed and treatment plan and has no further questions or concerns at this time.       The entirety of the information documented in the History of Present Illness, Review of Systems and Physical Exam were personally obtained by me. Portions of this information were initially documented by the  Certified Medical Assistant whose name is documented in [place] and reviewed by me for thoroughness and accuracy.  I have personally performed the exam and reviewed the chart and it is accurate to the best of my knowledge.  Haematologist has been used and any errors in dictation or transcription are unintentional.  Kelby Aline. [place], [facility]  Fritzi Mandes Rattan as a scribe for [employer] Justinn Welter, FNP.,have documented all relevant documentation on the behalf of Marcille Buffy, FNP,as directed by  Marcille Buffy, FNP while in the presence of Marcille Buffy, [place].

## 2019-06-24 NOTE — Patient Instructions (Addendum)
Return to office in 2 weeks with Dr. Rosanna Randy for recheck.  Return sooner if needed.   Advised patient call the office or your primary care doctor for an appointment if no improvement within 72 hours or if any symptoms change or worsen at any time  Advised ER or urgent Care if after hours or on weekend. Call 911 for emergency symptoms at any time.Patinet verbalized understanding of all instructions given/reviewed and treatment plan and has no further questions or concerns at this time.    Home Oxygen Use, Adult When a medical condition keeps you from getting enough oxygen, your health care provider may instruct you to take extra oxygen at home. Your health care provider will let you know:  When to take oxygen.  For how long to take oxygen.  How quickly oxygen should be delivered (flow rate), in liters per minute (LPM or L/M). Home oxygen can be given through:  A mask.  A nasal cannula. This is a device or tube that goes in the nostrils.  A transtracheal catheter. This is a small, flexible tube placed in the trachea.  A tracheostomy. This is a surgically made opening in the trachea. These devices are connected with tubing to an oxygen source, such as:  A tank. Tanks hold oxygen in gas form. They must be replaced when the oxygen is used up.  A liquid oxygen device. This holds oxygen in liquid form. It must be replaced when the oxygen is used up.  An oxygen concentrator machine. This filters oxygen in the room. It uses electricity, so you must have a backup cylinder of oxygen in case the power goes out. Supplies needed: To use oxygen, you will need:  A mask, nasal cannula, transtracheal catheter, or tracheostomy.  An oxygen tank, a liquid oxygen device, or an oxygen concentrator.  The tape that your health care provider recommends (optional). If you use a transtracheal catheter and your prescribed flow rate is 1 LPM or greater, you will also need a humidifier. Risks and  complications  Fire. This can happen if the oxygen is exposed to a heat source, flame, or spark.  Injury to skin. This can happen if liquid oxygen touches your skin.  Organ damage. This can happen if you get too little oxygen. How to use oxygen Your health care provider or a representative from your [place] will show you how to use your oxygen device. Follow her or his instructions. The instructions may look something like this: 1. Wash your hands. 2. If you use an oxygen concentrator, make sure it is plugged in. 3. Place one end of the tube into the port on the tank, device, or machine. 4. Place the mask over your nose and mouth. Or, place the nasal cannula and secure it with tape if instructed. If you use a tracheostomy or transtracheal catheter, connect it to the oxygen source as directed. 5. Make sure the liter-flow setting on the machine is at the level prescribed by your health care provider. 6. Turn on the machine or adjust the knob on the tank or device to the correct liter-flow setting. 7. When you are done, turn off and unplug the machine, or turn the knob to OFF. How to clean and care for the oxygen supplies Nasal cannula  Clean it with a warm, wet cloth daily or as needed.  Wash it with a liquid soap once a week.  Rinse it thoroughly once or twice a week.  Replace it every 2-4 weeks.  If you have an infection, such as a cold or pneumonia, change the cannula when you get better. Mask  Replace it every 2-4 weeks.  If you have an infection, such as a cold or pneumonia, change the mask when you get better. Humidifier bottle  Wash the bottle between each refill: ? Wash it with soap and warm water. ? Rinse it thoroughly. ? Disinfect it and its top. ? Air-dry it.  Make sure it is dry before you refill it. Oxygen concentrator  Clean the air filter at least twice a week according to directions from your home medical equipment and service company.  Wipe  down the cabinet every day. To do this: ? Unplug the unit. ? Wipe down the cabinet with a damp cloth. ? Dry the cabinet. Other equipment  Change any extra tubing every 1-3 months.  Follow instructions from your health care provider about taking care of any other equipment. Safety tips Fire safety tips   Keep your oxygen and oxygen supplies at least 5 ft away from sources of heat, flames, and sparks at all times.  Do not allow smoking near your oxygen. Put up "no smoking" signs in your home. Avoid smoking areas when in public.  Do not use materials that can burn (are flammable) while you use oxygen.  When you go to a restaurant with portable oxygen, ask to be seated in the nonsmoking section.  Keep a Data processing manager close by. Let your fire department know that you have oxygen in your home.  Test your home smoke detectors regularly. Traveling  Secure your oxygen tank in the vehicle so that it does not move around. Follow instructions from your medical device company about how to safely secure your tank.  Make sure you have enough oxygen for the amount of time you will be away from home.  If you are planning air travel, contact the airline to find out if they allow the use of an approved portable oxygen concentrator. You may also need documents from your health care provider and medical device company before you travel. General safety tips  If you use an oxygen cylinder, make sure it is in a stand or secured to an object that will not move (fixed object).  If you use liquid oxygen, make sure its container is kept upright.  If you use an oxygen concentrator: ? Dance movement psychotherapist company. Make sure you are given priority service in the event that your power goes out. ? Avoid using extension cords, if possible. Follow these instructions at home:  Use oxygen only as told by your health care provider.  Do not use alcohol or other drugs that make you relax (sedating drugs) unless  instructed. They can slow down your breathing rate and make it hard to get in enough oxygen.  Know how and when to order a refill of oxygen.  Always keep a spare tank of oxygen. Plan ahead for holidays when you may not be able to get a prescription filled.  Use water-based lubricants on your lips or nostrils. Do not use oil-based products like petroleum jelly.  To prevent skin irritation on your cheeks or behind your ears, tuck some gauze under the tubing. Contact a health care provider if:  You get headaches often.  You have shortness of breath.  You have a lasting cough.  You have anxiety.  You are sleepy all the time.  You develop an illness that affects your breathing.  You cannot exercise at your regular  level.  You are restless.  You have difficult or irregular breathing, and it is getting worse.  You have a fever.  You have persistent redness under your nose. Get help right away if:  You are confused.  You have blue lips or fingernails.  You are struggling to breathe. Summary  Your health care provider or a representative from your [place] will show you how to use your oxygen device. Follow her or his instructions.  If you use an oxygen concentrator, make sure it is plugged in.  Make sure the liter-flow setting on the machine is at the level prescribed by your health care provider.  Keep your oxygen and oxygen supplies at least 5 ft away from sources of heat, flames, and sparks at all times. This information is not intended to replace advice given to you by your health care provider. Make sure you discuss any questions you have with your health care provider. Document Released: 10/14/2003 Document Revised: 01/10/2018 Document Reviewed: 02/15/2016 Elsevier Patient Education  [place] of Breath, Adult Shortness of breath means you have trouble breathing. Shortness of breath could be a sign of a medical problem. Follow these  instructions at home:   Watch for any changes in your symptoms.  Do not use any products that contain nicotine or tobacco, such as cigarettes, e-cigarettes, and chewing tobacco.  Do not smoke. Smoking can cause shortness of breath. If you need help to quit smoking, ask your doctor.  Avoid things that can make it harder to breathe, such as: ? Mold. ? Dust. ? Air pollution. ? Chemical smells. ? Things that can cause allergy symptoms (allergens), if you have allergies.  Keep your living space clean. Use products that help remove mold and dust.  Rest as needed. Slowly return to your normal activities.  Take over-the-counter and prescription medicines only as told by your doctor. This includes oxygen therapy and inhaled medicines.  Keep all follow-up visits as told by your doctor. This is important. Contact a doctor if:  Your condition does not get better as soon as expected.  You have a hard time doing your normal activities, even after you rest.  You have new symptoms. Get help right away if:  Your shortness of breath gets worse.  You have trouble breathing when you are resting.  You feel light-headed or you pass out (faint).  You have a cough that is not helped by medicines.  You cough up blood.  You have pain with breathing.  You have pain in your chest, arms, shoulders, or belly (abdomen).  You have a fever.  You cannot walk up stairs.  You cannot exercise the way you normally do. These symptoms may represent a serious problem that is an emergency. Do not wait to see if the symptoms will go away. Get medical help right away. Call your local emergency services (911 in the [place] Do not drive yourself to the hospital. Summary  Shortness of breath is when you have trouble breathing enough air. It can be a sign of a medical problem.  Avoid things that make it hard for you to breathe, such as smoking, pollution, mold, and dust.  Watch for any changes in your  symptoms. Contact your doctor if you do not get better or you get worse. This information is not intended to replace advice given to you by your health care provider. Make sure you discuss any questions you have with your health care provider. Document Released: 01/10/2008  Document Revised: 12/24/2017 Document Reviewed: 12/24/2017 Elsevier Patient Education  [place].

## 2019-06-25 ENCOUNTER — Encounter: Payer: Self-pay | Admitting: Adult Health

## 2019-06-25 DIAGNOSIS — R600 Localized edema: Secondary | ICD-10-CM | POA: Diagnosis not present

## 2019-06-25 DIAGNOSIS — E782 Mixed hyperlipidemia: Secondary | ICD-10-CM | POA: Diagnosis not present

## 2019-06-25 DIAGNOSIS — I2510 Atherosclerotic heart disease of native coronary artery without angina pectoris: Secondary | ICD-10-CM | POA: Diagnosis not present

## 2019-06-25 DIAGNOSIS — E119 Type 2 diabetes mellitus without complications: Secondary | ICD-10-CM | POA: Diagnosis not present

## 2019-06-25 DIAGNOSIS — Z8619 Personal history of other infectious and parasitic diseases: Secondary | ICD-10-CM | POA: Diagnosis not present

## 2019-06-25 DIAGNOSIS — R001 Bradycardia, unspecified: Secondary | ICD-10-CM | POA: Diagnosis not present

## 2019-06-25 DIAGNOSIS — R0902 Hypoxemia: Secondary | ICD-10-CM | POA: Diagnosis not present

## 2019-06-25 DIAGNOSIS — R0602 Shortness of breath: Secondary | ICD-10-CM | POA: Diagnosis not present

## 2019-06-25 DIAGNOSIS — K219 Gastro-esophageal reflux disease without esophagitis: Secondary | ICD-10-CM | POA: Diagnosis not present

## 2019-06-25 DIAGNOSIS — I272 Pulmonary hypertension, unspecified: Secondary | ICD-10-CM | POA: Diagnosis not present

## 2019-06-25 DIAGNOSIS — E079 Disorder of thyroid, unspecified: Secondary | ICD-10-CM | POA: Diagnosis not present

## 2019-06-25 LAB — COMPREHENSIVE METABOLIC PANEL
ALT: 27 IU/L (ref 0–32)
AST: 30 IU/L (ref 0–40)
Albumin/Globulin Ratio: 1.4 (ref 1.2–2.2)
Albumin: 4.5 g/dL (ref 3.7–4.7)
Alkaline Phosphatase: 81 IU/L (ref 39–117)
BUN/Creatinine Ratio: 19 (ref 12–28)
BUN: 15 mg/dL (ref 8–27)
Bilirubin Total: 0.4 mg/dL (ref 0.0–1.2)
CO2: 18 mmol/L — ABNORMAL LOW (ref 20–29)
Calcium: 10 mg/dL (ref 8.7–10.3)
Chloride: 97 mmol/L (ref 96–106)
Creatinine, Ser: 0.78 mg/dL (ref 0.57–1.00)
GFR calc Af Amer: 87 mL/min/{1.73_m2} (ref >59)
GFR calc non Af Amer: 76 mL/min/{1.73_m2} (ref >59)
Globulin, Total: 3.3 g/dL (ref 1.5–4.5)
Glucose: 186 mg/dL — ABNORMAL HIGH (ref 65–99)
Potassium: 4.1 mmol/L (ref 3.5–5.2)
Sodium: 138 mmol/L (ref 134–144)
Total Protein: 7.8 g/dL (ref 6.0–8.5)

## 2019-06-25 LAB — CBC WITH DIFFERENTIAL/PLATELET
Basophils Absolute: 0.1 10*3/uL (ref 0.0–0.2)
Basos: 1 %
EOS (ABSOLUTE): 0.1 10*3/uL (ref 0.0–0.4)
Eos: 1 %
Hematocrit: 44.5 % (ref 34.0–46.6)
Hemoglobin: 14.4 g/dL (ref 11.1–15.9)
Immature Grans (Abs): 0 10*3/uL (ref 0.0–0.1)
Immature Granulocytes: 0 %
Lymphocytes Absolute: 3.4 10*3/uL — ABNORMAL HIGH (ref 0.7–3.1)
Lymphs: 36 %
MCH: 29.3 pg (ref 26.6–33.0)
MCHC: 32.4 g/dL (ref 31.5–35.7)
MCV: 91 fL (ref 79–97)
Monocytes Absolute: 0.8 10*3/uL (ref 0.1–0.9)
Monocytes: 8 %
Neutrophils Absolute: 4.9 10*3/uL (ref 1.4–7.0)
Neutrophils: 54 %
Platelets: 318 10*3/uL (ref 150–450)
RBC: 4.91 x10E6/uL (ref 3.77–5.28)
RDW: 14.5 % (ref 11.7–15.4)
WBC: 9.3 10*3/uL (ref 3.4–10.8)

## 2019-06-25 NOTE — Progress Notes (Signed)
Spoke with patient she states that when she checked her blood pressure this morning it was 122/78, she states that blood pressure has been within normal range since yesterdays visit. KW

## 2019-06-27 DIAGNOSIS — D120 Benign neoplasm of cecum: Secondary | ICD-10-CM | POA: Diagnosis not present

## 2019-06-27 DIAGNOSIS — R1319 Other dysphagia: Secondary | ICD-10-CM | POA: Diagnosis not present

## 2019-06-27 DIAGNOSIS — I110 Hypertensive heart disease with heart failure: Secondary | ICD-10-CM | POA: Diagnosis not present

## 2019-06-27 DIAGNOSIS — F411 Generalized anxiety disorder: Secondary | ICD-10-CM | POA: Diagnosis not present

## 2019-06-27 DIAGNOSIS — M109 Gout, unspecified: Secondary | ICD-10-CM | POA: Diagnosis not present

## 2019-06-27 DIAGNOSIS — I5032 Chronic diastolic (congestive) heart failure: Secondary | ICD-10-CM | POA: Diagnosis not present

## 2019-06-27 DIAGNOSIS — J1289 Other viral pneumonia: Secondary | ICD-10-CM | POA: Diagnosis not present

## 2019-06-27 DIAGNOSIS — U071 COVID-19: Secondary | ICD-10-CM | POA: Diagnosis not present

## 2019-06-27 DIAGNOSIS — G4730 Sleep apnea, unspecified: Secondary | ICD-10-CM | POA: Diagnosis not present

## 2019-06-27 DIAGNOSIS — I4510 Unspecified right bundle-branch block: Secondary | ICD-10-CM | POA: Diagnosis not present

## 2019-06-27 DIAGNOSIS — E669 Obesity, unspecified: Secondary | ICD-10-CM | POA: Diagnosis not present

## 2019-06-27 DIAGNOSIS — I2510 Atherosclerotic heart disease of native coronary artery without angina pectoris: Secondary | ICD-10-CM | POA: Diagnosis not present

## 2019-06-27 DIAGNOSIS — M349 Systemic sclerosis, unspecified: Secondary | ICD-10-CM | POA: Diagnosis not present

## 2019-06-27 DIAGNOSIS — K219 Gastro-esophageal reflux disease without esophagitis: Secondary | ICD-10-CM | POA: Diagnosis not present

## 2019-06-27 DIAGNOSIS — M1990 Unspecified osteoarthritis, unspecified site: Secondary | ICD-10-CM | POA: Diagnosis not present

## 2019-06-27 DIAGNOSIS — E1142 Type 2 diabetes mellitus with diabetic polyneuropathy: Secondary | ICD-10-CM | POA: Diagnosis not present

## 2019-06-27 DIAGNOSIS — G8929 Other chronic pain: Secondary | ICD-10-CM | POA: Diagnosis not present

## 2019-06-27 DIAGNOSIS — Q438 Other specified congenital malformations of intestine: Secondary | ICD-10-CM | POA: Diagnosis not present

## 2019-06-27 DIAGNOSIS — K5730 Diverticulosis of large intestine without perforation or abscess without bleeding: Secondary | ICD-10-CM | POA: Diagnosis not present

## 2019-06-27 DIAGNOSIS — J431 Panlobular emphysema: Secondary | ICD-10-CM | POA: Diagnosis not present

## 2019-06-27 DIAGNOSIS — K2970 Gastritis, unspecified, without bleeding: Secondary | ICD-10-CM | POA: Diagnosis not present

## 2019-06-27 DIAGNOSIS — K449 Diaphragmatic hernia without obstruction or gangrene: Secondary | ICD-10-CM | POA: Diagnosis not present

## 2019-06-27 DIAGNOSIS — E039 Hypothyroidism, unspecified: Secondary | ICD-10-CM | POA: Diagnosis not present

## 2019-06-27 DIAGNOSIS — F329 Major depressive disorder, single episode, unspecified: Secondary | ICD-10-CM | POA: Diagnosis not present

## 2019-06-27 DIAGNOSIS — N200 Calculus of kidney: Secondary | ICD-10-CM | POA: Diagnosis not present

## 2019-06-29 DIAGNOSIS — G4733 Obstructive sleep apnea (adult) (pediatric): Secondary | ICD-10-CM | POA: Diagnosis not present

## 2019-07-01 DIAGNOSIS — G4730 Sleep apnea, unspecified: Secondary | ICD-10-CM | POA: Diagnosis not present

## 2019-07-01 DIAGNOSIS — F411 Generalized anxiety disorder: Secondary | ICD-10-CM | POA: Diagnosis not present

## 2019-07-01 DIAGNOSIS — R1319 Other dysphagia: Secondary | ICD-10-CM | POA: Diagnosis not present

## 2019-07-01 DIAGNOSIS — I2510 Atherosclerotic heart disease of native coronary artery without angina pectoris: Secondary | ICD-10-CM | POA: Diagnosis not present

## 2019-07-01 DIAGNOSIS — M349 Systemic sclerosis, unspecified: Secondary | ICD-10-CM | POA: Diagnosis not present

## 2019-07-01 DIAGNOSIS — K219 Gastro-esophageal reflux disease without esophagitis: Secondary | ICD-10-CM | POA: Diagnosis not present

## 2019-07-01 DIAGNOSIS — G8929 Other chronic pain: Secondary | ICD-10-CM | POA: Diagnosis not present

## 2019-07-01 DIAGNOSIS — K5730 Diverticulosis of large intestine without perforation or abscess without bleeding: Secondary | ICD-10-CM | POA: Diagnosis not present

## 2019-07-01 DIAGNOSIS — K449 Diaphragmatic hernia without obstruction or gangrene: Secondary | ICD-10-CM | POA: Diagnosis not present

## 2019-07-01 DIAGNOSIS — U071 COVID-19: Secondary | ICD-10-CM | POA: Diagnosis not present

## 2019-07-01 DIAGNOSIS — N200 Calculus of kidney: Secondary | ICD-10-CM | POA: Diagnosis not present

## 2019-07-01 DIAGNOSIS — I110 Hypertensive heart disease with heart failure: Secondary | ICD-10-CM | POA: Diagnosis not present

## 2019-07-01 DIAGNOSIS — Q438 Other specified congenital malformations of intestine: Secondary | ICD-10-CM | POA: Diagnosis not present

## 2019-07-01 DIAGNOSIS — M1990 Unspecified osteoarthritis, unspecified site: Secondary | ICD-10-CM | POA: Diagnosis not present

## 2019-07-01 DIAGNOSIS — F329 Major depressive disorder, single episode, unspecified: Secondary | ICD-10-CM | POA: Diagnosis not present

## 2019-07-01 DIAGNOSIS — E1142 Type 2 diabetes mellitus with diabetic polyneuropathy: Secondary | ICD-10-CM | POA: Diagnosis not present

## 2019-07-01 DIAGNOSIS — I4510 Unspecified right bundle-branch block: Secondary | ICD-10-CM | POA: Diagnosis not present

## 2019-07-01 DIAGNOSIS — D120 Benign neoplasm of cecum: Secondary | ICD-10-CM | POA: Diagnosis not present

## 2019-07-01 DIAGNOSIS — K2970 Gastritis, unspecified, without bleeding: Secondary | ICD-10-CM | POA: Diagnosis not present

## 2019-07-01 DIAGNOSIS — E669 Obesity, unspecified: Secondary | ICD-10-CM | POA: Diagnosis not present

## 2019-07-01 DIAGNOSIS — J431 Panlobular emphysema: Secondary | ICD-10-CM | POA: Diagnosis not present

## 2019-07-01 DIAGNOSIS — M109 Gout, unspecified: Secondary | ICD-10-CM | POA: Diagnosis not present

## 2019-07-01 DIAGNOSIS — J1289 Other viral pneumonia: Secondary | ICD-10-CM | POA: Diagnosis not present

## 2019-07-01 DIAGNOSIS — E039 Hypothyroidism, unspecified: Secondary | ICD-10-CM | POA: Diagnosis not present

## 2019-07-01 DIAGNOSIS — I5032 Chronic diastolic (congestive) heart failure: Secondary | ICD-10-CM | POA: Diagnosis not present

## 2019-07-07 DIAGNOSIS — J431 Panlobular emphysema: Secondary | ICD-10-CM | POA: Diagnosis not present

## 2019-07-07 DIAGNOSIS — J1289 Other viral pneumonia: Secondary | ICD-10-CM | POA: Diagnosis not present

## 2019-07-07 DIAGNOSIS — E119 Type 2 diabetes mellitus without complications: Secondary | ICD-10-CM | POA: Diagnosis not present

## 2019-07-07 DIAGNOSIS — U071 COVID-19: Secondary | ICD-10-CM | POA: Diagnosis not present

## 2019-07-07 DIAGNOSIS — I5032 Chronic diastolic (congestive) heart failure: Secondary | ICD-10-CM | POA: Diagnosis not present

## 2019-07-07 DIAGNOSIS — E1142 Type 2 diabetes mellitus with diabetic polyneuropathy: Secondary | ICD-10-CM | POA: Diagnosis not present

## 2019-07-07 DIAGNOSIS — E039 Hypothyroidism, unspecified: Secondary | ICD-10-CM | POA: Diagnosis not present

## 2019-07-07 DIAGNOSIS — I110 Hypertensive heart disease with heart failure: Secondary | ICD-10-CM | POA: Diagnosis not present

## 2019-07-07 DIAGNOSIS — I4510 Unspecified right bundle-branch block: Secondary | ICD-10-CM | POA: Diagnosis not present

## 2019-07-07 LAB — HM DIABETES EYE EXAM

## 2019-07-09 ENCOUNTER — Telehealth: Payer: Self-pay

## 2019-07-09 DIAGNOSIS — F411 Generalized anxiety disorder: Secondary | ICD-10-CM | POA: Diagnosis not present

## 2019-07-09 DIAGNOSIS — I110 Hypertensive heart disease with heart failure: Secondary | ICD-10-CM | POA: Diagnosis not present

## 2019-07-09 DIAGNOSIS — G4730 Sleep apnea, unspecified: Secondary | ICD-10-CM | POA: Diagnosis not present

## 2019-07-09 DIAGNOSIS — K5730 Diverticulosis of large intestine without perforation or abscess without bleeding: Secondary | ICD-10-CM | POA: Diagnosis not present

## 2019-07-09 DIAGNOSIS — M349 Systemic sclerosis, unspecified: Secondary | ICD-10-CM | POA: Diagnosis not present

## 2019-07-09 DIAGNOSIS — E039 Hypothyroidism, unspecified: Secondary | ICD-10-CM | POA: Diagnosis not present

## 2019-07-09 DIAGNOSIS — K219 Gastro-esophageal reflux disease without esophagitis: Secondary | ICD-10-CM | POA: Diagnosis not present

## 2019-07-09 DIAGNOSIS — K449 Diaphragmatic hernia without obstruction or gangrene: Secondary | ICD-10-CM | POA: Diagnosis not present

## 2019-07-09 DIAGNOSIS — E669 Obesity, unspecified: Secondary | ICD-10-CM | POA: Diagnosis not present

## 2019-07-09 DIAGNOSIS — M1990 Unspecified osteoarthritis, unspecified site: Secondary | ICD-10-CM | POA: Diagnosis not present

## 2019-07-09 DIAGNOSIS — U071 COVID-19: Secondary | ICD-10-CM | POA: Diagnosis not present

## 2019-07-09 DIAGNOSIS — I5032 Chronic diastolic (congestive) heart failure: Secondary | ICD-10-CM | POA: Diagnosis not present

## 2019-07-09 DIAGNOSIS — I2510 Atherosclerotic heart disease of native coronary artery without angina pectoris: Secondary | ICD-10-CM | POA: Diagnosis not present

## 2019-07-09 DIAGNOSIS — N200 Calculus of kidney: Secondary | ICD-10-CM | POA: Diagnosis not present

## 2019-07-09 DIAGNOSIS — R1319 Other dysphagia: Secondary | ICD-10-CM | POA: Diagnosis not present

## 2019-07-09 DIAGNOSIS — M109 Gout, unspecified: Secondary | ICD-10-CM | POA: Diagnosis not present

## 2019-07-09 DIAGNOSIS — K2970 Gastritis, unspecified, without bleeding: Secondary | ICD-10-CM | POA: Diagnosis not present

## 2019-07-09 DIAGNOSIS — I4510 Unspecified right bundle-branch block: Secondary | ICD-10-CM | POA: Diagnosis not present

## 2019-07-09 DIAGNOSIS — G8929 Other chronic pain: Secondary | ICD-10-CM | POA: Diagnosis not present

## 2019-07-09 DIAGNOSIS — E1142 Type 2 diabetes mellitus with diabetic polyneuropathy: Secondary | ICD-10-CM | POA: Diagnosis not present

## 2019-07-09 DIAGNOSIS — F329 Major depressive disorder, single episode, unspecified: Secondary | ICD-10-CM | POA: Diagnosis not present

## 2019-07-09 DIAGNOSIS — J1289 Other viral pneumonia: Secondary | ICD-10-CM | POA: Diagnosis not present

## 2019-07-09 DIAGNOSIS — J431 Panlobular emphysema: Secondary | ICD-10-CM | POA: Diagnosis not present

## 2019-07-09 DIAGNOSIS — Q438 Other specified congenital malformations of intestine: Secondary | ICD-10-CM | POA: Diagnosis not present

## 2019-07-09 DIAGNOSIS — D120 Benign neoplasm of cecum: Secondary | ICD-10-CM | POA: Diagnosis not present

## 2019-07-09 NOTE — Telephone Encounter (Signed)
Copied from [place] [id number]. Topic: General - Other >> Jul 09, 2019  1:58 PM Greggory Keen D wrote: Reason for CRM: pt called saying [facility] home health faxed an order for a walker  with a seat about 2 weeks ago.  They are refaxing today.  Pt wants to know if we will be on the watch for that fax.

## 2019-07-10 NOTE — Telephone Encounter (Signed)
Received fax and gave it to Dr. Rosanna Randy.

## 2019-07-11 NOTE — Progress Notes (Signed)
Patient: Teresa [name] Female    DOB: November 17, 1945   73 y.o.   MRN: [id number] Visit Date: 07/15/2019  Today's Provider: Wilhemena Durie, MD   Chief Complaint  Patient presents with  . Follow-up  . Cough   Subjective:     HPI  Patient slowly recovering from Covid infection.  She is still short of breath with any exertion and has chronic fatigue.   Panlobular emphysema ([place]) From 08/23/2018-seen by Laverna Peace. Continue current treatment plan. Follow closely due to Covid 19 history.   Cough/ shortness of breath with Covid 19 exposure/ history. From 08/23/2018-seen by Laverna Peace. Chest x ray follow up at [facility] ordered  Recheck CBC and CMP. Advised to return in about 2 weeks for follow up. Advised any worsening symptoms, Go to Emergency room/ urgent care if worse.   Patient states cough is better. However she is still having shortness of breath and low HR.     Allergies  Allergen Reactions  . Bacitracin-Neomycin-Polymyxin   . Clarithromycin Other (See Comments), Nausea Only and Nausea And Vomiting  . Codeine     Other reaction(s): Hallucination  . Dilaudid  [Hydromorphone Hcl] Nausea And Vomiting  . Iodine   . Iohexol      Desc: PT HAS HIVES/ITCHING   . Neomycin-Bacitracin Zn-Polymyx   . Tamiflu  [Oseltamivir Phosphate]     Other reaction(s): Abdominal Pain, Vomiting  . Zolpidem Nausea And Vomiting and Other (See Comments)    Hallucinations  . Benzalkonium Chloride Itching, Rash and Swelling  . Lidocaine Hcl Itching, Rash and Swelling  . Morphine Nausea Only, Rash and Nausea And Vomiting  . Tape Itching and Rash  . Tapentadol Rash     Current Outpatient Medications:  .  acetaminophen (TYLENOL) 500 MG tablet, Take 500 mg by mouth every 6 (six) hours as needed for mild pain or headache., Disp: , Rfl:  .  allopurinol (ZYLOPRIM) 100 MG tablet, TAKE 1 TABLET BY MOUTH EVERY DAY, Disp: 90 tablet, Rfl: 3 .  atorvastatin (LIPITOR) 10 MG  tablet, TAKE 1 TABLET BY MOUTH EVERY DAY AT BEDTIME (Patient taking differently: Take 10 mg by mouth at bedtime. ), Disp: 90 tablet, Rfl: 3 .  colchicine 0.6 MG tablet, Take 0.6 mg by mouth daily as needed (for gout flare-ups). , Disp: , Rfl:  .  Continuous Blood Gluc Sensor (FREESTYLE LIBRE 14 DAY SENSOR) MISC, USE EVERY 14 DAYS AS DIRECTED, Disp: 2 each, Rfl: 12 .  furosemide (LASIX) 40 MG tablet, TAKE 1 TABLET BY MOUTH EVERY DAY, Disp: 90 tablet, Rfl: 3 .  gabapentin (NEURONTIN) 400 MG capsule, Take 1 capsule (400 mg total) by mouth 3 (three) times daily., Disp: 270 capsule, Rfl: 3 .  glimepiride (AMARYL) 4 MG tablet, TAKE 1 TABLET (4 MG TOTAL) BY MOUTH 2 (TWO) TIMES DAILY., Disp: 180 tablet, Rfl: 3 .  glucose blood test strip, Check blood sugar daily. Use as instructed, Disp: 100 each, Rfl: 12 .  guaiFENesin-dextromethorphan (ROBITUSSIN DM) 100-10 MG/5ML syrup, Take 5 mLs by mouth every 4 (four) hours as needed for cough., Disp: 118 mL, Rfl: 0 .  JARDIANCE 25 MG TABS tablet, TAKE 25 MG BY MOUTH DAILY., Disp: 90 tablet, Rfl: 3 .  levothyroxine (SYNTHROID, LEVOTHROID) 88 MCG tablet, TAKE 1 TABLET BY MOUTH EVERY DAY (Patient taking differently: Take 88 mcg by mouth daily before breakfast. ), Disp: 90 tablet, Rfl: 3 .  losartan (COZAAR) 25 MG tablet, Take 1 tablet (  25 mg total) by mouth daily., Disp: 90 tablet, Rfl: 3 .  menthol-cetylpyridinium (CEPACOL) 3 MG lozenge, Take 1 lozenge (3 mg total) by mouth as needed for sore throat., Disp: , Rfl:  .  metFORMIN (GLUCOPHAGE) 1000 MG tablet, TAKE 1 TABLET BY MOUTH TWICE A DAY, Disp: 180 tablet, Rfl: 3 .  nystatin cream (MYCOSTATIN), Apply 1 application topically 2 (two) times daily. (Patient taking differently: Apply 1 application topically 2 (two) times daily as needed (for diabetic yeast reactions.). ), Disp: 30 g, Rfl: 0 .  ondansetron (ZOFRAN) 4 MG tablet, Take 1 tablet (4 mg total) by mouth every 8 (eight) hours as needed for nausea or vomiting., Disp:  45 tablet, Rfl: 0 .  ONE TOUCH LANCETS MISC, 1 each by Does not apply route daily., Disp: 30 each, Rfl: 12 .  pioglitazone (ACTOS) 30 MG tablet, TAKE 1 TABLET BY MOUTH EVERY DAY (Patient taking differently: Take 30 mg by mouth daily. ), Disp: 90 tablet, Rfl: 3 .  Semaglutide,0.25 or 0.5MG /DOS, (OZEMPIC, 0.25 OR 0.5 MG/DOSE,) 2 MG/1.5ML SOPN, Inject 0.5 Syringes into the skin once a week., Disp: 1.5 mL, Rfl: 11 .  benzonatate (TESSALON) 200 MG capsule, Take 1 capsule (200 mg total) by mouth 3 (three) times daily. (Patient not taking: Reported on 06/24/2019), Disp: , Rfl: 0  Review of Systems  Constitutional: Positive for fatigue. Negative for appetite change, chills and fever.  HENT: Negative.   Eyes: Negative.   Respiratory: Positive for shortness of breath. Negative for chest tightness.   Cardiovascular: Negative.  Negative for chest pain and palpitations.  Gastrointestinal: Negative.  Negative for abdominal pain, nausea and vomiting.  Endocrine: Negative.   Allergic/Immunologic: Negative.   Neurological: Negative for dizziness and weakness.  Hematological: Negative.   Psychiatric/Behavioral: Negative.     Social History   Tobacco Use  . Smoking status: Never Smoker  . Smokeless tobacco: Never Used  Substance Use Topics  . Alcohol use: No    Alcohol/week: 0.0 standard drinks      Objective:   BP 104/64 (BP Location: Left Arm, Patient Position: Sitting, Cuff Size: Large)   Pulse 73   Temp (!) 97.3 F (36.3 C) (Other (Comment))   Resp 18   Ht 5\' 2"  (1.575 m)   Wt 194 lb (88 kg)   SpO2 94%   BMI 35.48 kg/m  Vitals:   07/15/19 1510  BP: 104/64  Pulse: 73  Resp: 18  Temp: (!) 97.3 F (36.3 C)  TempSrc: Other (Comment)  SpO2: 94%  Weight: 194 lb (88 kg)  Height: 5\' 2"  (1.575 m)  Body mass index is 35.48 kg/m.   Physical Exam Vitals reviewed.  Constitutional:      Appearance: Normal appearance.  HENT:     Head: Normocephalic and atraumatic.     Right Ear:  External ear normal.     Left Ear: External ear normal.  Eyes:     General: No scleral icterus. Cardiovascular:     Rate and Rhythm: Normal rate and regular rhythm.     Heart sounds: Normal heart sounds.  Pulmonary:     Breath sounds: Normal breath sounds.  Abdominal:     Palpations: Abdomen is soft.  Musculoskeletal:     Right lower leg: No edema.     Left lower leg: No edema.  Skin:    General: Skin is warm and dry.  Neurological:     General: No focal deficit present.     Mental Status: She is  alert and oriented to person, place, and time.  Psychiatric:        Mood and Affect: Mood normal.        Behavior: Behavior normal.        Thought Content: Thought content normal.        Judgment: Judgment normal.      No results found for any visits on 07/15/19.     Assessment & Plan    1. History of 2019 novel coronavirus disease (COVID-19) Patient is slowly recovering.  We will tentatively plan on her going back to work September 08, 2019 but I am not sure at 52 that she will be able to do this.  Return to clinic 1 month More than 50% 25 minute visit spent in counseling or coordination of care  2. Diabetes mellitus type II, non insulin dependent (HCC) A1c on next visit.  3. Coronary artery disease involving native heart, angina presence unspecified, unspecified vessel or lesion type All risk factors treated  4. Pneumonia due to COVID-19 virus Consider chest x-ray or CT of her chest or pulmonary referral on next visit.      Cranford Mon, MD  [facility]

## 2019-07-14 DIAGNOSIS — R1319 Other dysphagia: Secondary | ICD-10-CM | POA: Diagnosis not present

## 2019-07-14 DIAGNOSIS — M1990 Unspecified osteoarthritis, unspecified site: Secondary | ICD-10-CM | POA: Diagnosis not present

## 2019-07-14 DIAGNOSIS — N200 Calculus of kidney: Secondary | ICD-10-CM | POA: Diagnosis not present

## 2019-07-14 DIAGNOSIS — K5730 Diverticulosis of large intestine without perforation or abscess without bleeding: Secondary | ICD-10-CM | POA: Diagnosis not present

## 2019-07-14 DIAGNOSIS — I5032 Chronic diastolic (congestive) heart failure: Secondary | ICD-10-CM | POA: Diagnosis not present

## 2019-07-14 DIAGNOSIS — F411 Generalized anxiety disorder: Secondary | ICD-10-CM | POA: Diagnosis not present

## 2019-07-14 DIAGNOSIS — J1289 Other viral pneumonia: Secondary | ICD-10-CM | POA: Diagnosis not present

## 2019-07-14 DIAGNOSIS — F329 Major depressive disorder, single episode, unspecified: Secondary | ICD-10-CM | POA: Diagnosis not present

## 2019-07-14 DIAGNOSIS — D120 Benign neoplasm of cecum: Secondary | ICD-10-CM | POA: Diagnosis not present

## 2019-07-14 DIAGNOSIS — I4510 Unspecified right bundle-branch block: Secondary | ICD-10-CM | POA: Diagnosis not present

## 2019-07-14 DIAGNOSIS — E669 Obesity, unspecified: Secondary | ICD-10-CM | POA: Diagnosis not present

## 2019-07-14 DIAGNOSIS — U071 COVID-19: Secondary | ICD-10-CM | POA: Diagnosis not present

## 2019-07-14 DIAGNOSIS — J431 Panlobular emphysema: Secondary | ICD-10-CM | POA: Diagnosis not present

## 2019-07-14 DIAGNOSIS — I2510 Atherosclerotic heart disease of native coronary artery without angina pectoris: Secondary | ICD-10-CM | POA: Diagnosis not present

## 2019-07-14 DIAGNOSIS — M109 Gout, unspecified: Secondary | ICD-10-CM | POA: Diagnosis not present

## 2019-07-14 DIAGNOSIS — G8929 Other chronic pain: Secondary | ICD-10-CM | POA: Diagnosis not present

## 2019-07-14 DIAGNOSIS — M349 Systemic sclerosis, unspecified: Secondary | ICD-10-CM | POA: Diagnosis not present

## 2019-07-14 DIAGNOSIS — G4730 Sleep apnea, unspecified: Secondary | ICD-10-CM | POA: Diagnosis not present

## 2019-07-14 DIAGNOSIS — E1142 Type 2 diabetes mellitus with diabetic polyneuropathy: Secondary | ICD-10-CM | POA: Diagnosis not present

## 2019-07-14 DIAGNOSIS — Q438 Other specified congenital malformations of intestine: Secondary | ICD-10-CM | POA: Diagnosis not present

## 2019-07-14 DIAGNOSIS — K2970 Gastritis, unspecified, without bleeding: Secondary | ICD-10-CM | POA: Diagnosis not present

## 2019-07-14 DIAGNOSIS — K219 Gastro-esophageal reflux disease without esophagitis: Secondary | ICD-10-CM | POA: Diagnosis not present

## 2019-07-14 DIAGNOSIS — E039 Hypothyroidism, unspecified: Secondary | ICD-10-CM | POA: Diagnosis not present

## 2019-07-14 DIAGNOSIS — K449 Diaphragmatic hernia without obstruction or gangrene: Secondary | ICD-10-CM | POA: Diagnosis not present

## 2019-07-14 DIAGNOSIS — I110 Hypertensive heart disease with heart failure: Secondary | ICD-10-CM | POA: Diagnosis not present

## 2019-07-15 ENCOUNTER — Ambulatory Visit (INDEPENDENT_AMBULATORY_CARE_PROVIDER_SITE_OTHER): Payer: PPO | Admitting: Family Medicine

## 2019-07-15 ENCOUNTER — Other Ambulatory Visit: Payer: Self-pay

## 2019-07-15 ENCOUNTER — Encounter: Payer: Self-pay | Admitting: Family Medicine

## 2019-07-15 VITALS — BP 104/64 | HR 73 | Temp 97.3°F | Resp 18 | Ht 62.0 in | Wt 194.0 lb

## 2019-07-15 DIAGNOSIS — I2510 Atherosclerotic heart disease of native coronary artery without angina pectoris: Secondary | ICD-10-CM | POA: Diagnosis not present

## 2019-07-15 DIAGNOSIS — Z8619 Personal history of other infectious and parasitic diseases: Secondary | ICD-10-CM | POA: Diagnosis not present

## 2019-07-15 DIAGNOSIS — Z8616 Personal history of COVID-19: Secondary | ICD-10-CM

## 2019-07-15 DIAGNOSIS — J1289 Other viral pneumonia: Secondary | ICD-10-CM

## 2019-07-15 DIAGNOSIS — U071 COVID-19: Secondary | ICD-10-CM

## 2019-07-15 DIAGNOSIS — E119 Type 2 diabetes mellitus without complications: Secondary | ICD-10-CM

## 2019-07-15 DIAGNOSIS — J1282 Pneumonia due to coronavirus disease 2019: Secondary | ICD-10-CM

## 2019-07-16 DIAGNOSIS — U071 COVID-19: Secondary | ICD-10-CM | POA: Diagnosis not present

## 2019-07-16 DIAGNOSIS — I5032 Chronic diastolic (congestive) heart failure: Secondary | ICD-10-CM | POA: Diagnosis not present

## 2019-07-22 DIAGNOSIS — K5730 Diverticulosis of large intestine without perforation or abscess without bleeding: Secondary | ICD-10-CM | POA: Diagnosis not present

## 2019-07-22 DIAGNOSIS — K449 Diaphragmatic hernia without obstruction or gangrene: Secondary | ICD-10-CM | POA: Diagnosis not present

## 2019-07-22 DIAGNOSIS — G4730 Sleep apnea, unspecified: Secondary | ICD-10-CM | POA: Diagnosis not present

## 2019-07-22 DIAGNOSIS — I2510 Atherosclerotic heart disease of native coronary artery without angina pectoris: Secondary | ICD-10-CM | POA: Diagnosis not present

## 2019-07-22 DIAGNOSIS — I110 Hypertensive heart disease with heart failure: Secondary | ICD-10-CM | POA: Diagnosis not present

## 2019-07-22 DIAGNOSIS — M109 Gout, unspecified: Secondary | ICD-10-CM | POA: Diagnosis not present

## 2019-07-22 DIAGNOSIS — E669 Obesity, unspecified: Secondary | ICD-10-CM | POA: Diagnosis not present

## 2019-07-22 DIAGNOSIS — Q438 Other specified congenital malformations of intestine: Secondary | ICD-10-CM | POA: Diagnosis not present

## 2019-07-22 DIAGNOSIS — R1319 Other dysphagia: Secondary | ICD-10-CM | POA: Diagnosis not present

## 2019-07-22 DIAGNOSIS — J1289 Other viral pneumonia: Secondary | ICD-10-CM | POA: Diagnosis not present

## 2019-07-22 DIAGNOSIS — E039 Hypothyroidism, unspecified: Secondary | ICD-10-CM | POA: Diagnosis not present

## 2019-07-22 DIAGNOSIS — K219 Gastro-esophageal reflux disease without esophagitis: Secondary | ICD-10-CM | POA: Diagnosis not present

## 2019-07-22 DIAGNOSIS — F329 Major depressive disorder, single episode, unspecified: Secondary | ICD-10-CM | POA: Diagnosis not present

## 2019-07-22 DIAGNOSIS — J431 Panlobular emphysema: Secondary | ICD-10-CM | POA: Diagnosis not present

## 2019-07-22 DIAGNOSIS — D120 Benign neoplasm of cecum: Secondary | ICD-10-CM | POA: Diagnosis not present

## 2019-07-22 DIAGNOSIS — N200 Calculus of kidney: Secondary | ICD-10-CM | POA: Diagnosis not present

## 2019-07-22 DIAGNOSIS — I4510 Unspecified right bundle-branch block: Secondary | ICD-10-CM | POA: Diagnosis not present

## 2019-07-22 DIAGNOSIS — F411 Generalized anxiety disorder: Secondary | ICD-10-CM | POA: Diagnosis not present

## 2019-07-22 DIAGNOSIS — G8929 Other chronic pain: Secondary | ICD-10-CM | POA: Diagnosis not present

## 2019-07-22 DIAGNOSIS — E1142 Type 2 diabetes mellitus with diabetic polyneuropathy: Secondary | ICD-10-CM | POA: Diagnosis not present

## 2019-07-22 DIAGNOSIS — M1990 Unspecified osteoarthritis, unspecified site: Secondary | ICD-10-CM | POA: Diagnosis not present

## 2019-07-22 DIAGNOSIS — I5032 Chronic diastolic (congestive) heart failure: Secondary | ICD-10-CM | POA: Diagnosis not present

## 2019-07-22 DIAGNOSIS — M349 Systemic sclerosis, unspecified: Secondary | ICD-10-CM | POA: Diagnosis not present

## 2019-07-22 DIAGNOSIS — K2970 Gastritis, unspecified, without bleeding: Secondary | ICD-10-CM | POA: Diagnosis not present

## 2019-07-22 DIAGNOSIS — U071 COVID-19: Secondary | ICD-10-CM | POA: Diagnosis not present

## 2019-07-24 ENCOUNTER — Telehealth: Payer: Self-pay

## 2019-07-24 DIAGNOSIS — K2970 Gastritis, unspecified, without bleeding: Secondary | ICD-10-CM | POA: Diagnosis not present

## 2019-07-24 DIAGNOSIS — G8929 Other chronic pain: Secondary | ICD-10-CM | POA: Diagnosis not present

## 2019-07-24 DIAGNOSIS — J1289 Other viral pneumonia: Secondary | ICD-10-CM | POA: Diagnosis not present

## 2019-07-24 DIAGNOSIS — I5032 Chronic diastolic (congestive) heart failure: Secondary | ICD-10-CM | POA: Diagnosis not present

## 2019-07-24 DIAGNOSIS — K449 Diaphragmatic hernia without obstruction or gangrene: Secondary | ICD-10-CM | POA: Diagnosis not present

## 2019-07-24 DIAGNOSIS — D120 Benign neoplasm of cecum: Secondary | ICD-10-CM | POA: Diagnosis not present

## 2019-07-24 DIAGNOSIS — K219 Gastro-esophageal reflux disease without esophagitis: Secondary | ICD-10-CM | POA: Diagnosis not present

## 2019-07-24 DIAGNOSIS — I110 Hypertensive heart disease with heart failure: Secondary | ICD-10-CM | POA: Diagnosis not present

## 2019-07-24 DIAGNOSIS — F411 Generalized anxiety disorder: Secondary | ICD-10-CM | POA: Diagnosis not present

## 2019-07-24 DIAGNOSIS — I4510 Unspecified right bundle-branch block: Secondary | ICD-10-CM | POA: Diagnosis not present

## 2019-07-24 DIAGNOSIS — M1990 Unspecified osteoarthritis, unspecified site: Secondary | ICD-10-CM | POA: Diagnosis not present

## 2019-07-24 DIAGNOSIS — E669 Obesity, unspecified: Secondary | ICD-10-CM | POA: Diagnosis not present

## 2019-07-24 DIAGNOSIS — E1142 Type 2 diabetes mellitus with diabetic polyneuropathy: Secondary | ICD-10-CM | POA: Diagnosis not present

## 2019-07-24 DIAGNOSIS — K5730 Diverticulosis of large intestine without perforation or abscess without bleeding: Secondary | ICD-10-CM | POA: Diagnosis not present

## 2019-07-24 DIAGNOSIS — I2510 Atherosclerotic heart disease of native coronary artery without angina pectoris: Secondary | ICD-10-CM | POA: Diagnosis not present

## 2019-07-24 DIAGNOSIS — M349 Systemic sclerosis, unspecified: Secondary | ICD-10-CM | POA: Diagnosis not present

## 2019-07-24 DIAGNOSIS — F329 Major depressive disorder, single episode, unspecified: Secondary | ICD-10-CM | POA: Diagnosis not present

## 2019-07-24 DIAGNOSIS — G4730 Sleep apnea, unspecified: Secondary | ICD-10-CM | POA: Diagnosis not present

## 2019-07-24 DIAGNOSIS — R1319 Other dysphagia: Secondary | ICD-10-CM | POA: Diagnosis not present

## 2019-07-24 DIAGNOSIS — N200 Calculus of kidney: Secondary | ICD-10-CM | POA: Diagnosis not present

## 2019-07-24 DIAGNOSIS — Q438 Other specified congenital malformations of intestine: Secondary | ICD-10-CM | POA: Diagnosis not present

## 2019-07-24 DIAGNOSIS — M109 Gout, unspecified: Secondary | ICD-10-CM | POA: Diagnosis not present

## 2019-07-24 DIAGNOSIS — U071 COVID-19: Secondary | ICD-10-CM | POA: Diagnosis not present

## 2019-07-24 DIAGNOSIS — J431 Panlobular emphysema: Secondary | ICD-10-CM | POA: Diagnosis not present

## 2019-07-24 DIAGNOSIS — E039 Hypothyroidism, unspecified: Secondary | ICD-10-CM | POA: Diagnosis not present

## 2019-07-24 NOTE — Telephone Encounter (Signed)
Will be done by Monday.

## 2019-07-24 NOTE — Telephone Encounter (Signed)
Please advise FMLA paperwork?

## 2019-07-24 NOTE — Telephone Encounter (Signed)
Copied from [place] [id number]. Topic: General - Other >> Jul 24, 2019 11:14 AM Teresa [name] wrote: Patient calling to check status of FMLA paperwork. She states the paperwork needed to be sent to [facility]  Fax# [phone]

## 2019-07-29 DIAGNOSIS — G4733 Obstructive sleep apnea (adult) (pediatric): Secondary | ICD-10-CM | POA: Diagnosis not present

## 2019-07-29 NOTE — Telephone Encounter (Signed)
FMLA forms were faxed as pt requested. Pt was advised and pt will pick up copy of forms that were placed up front at her appt on 08/18/2019. Thanks TNP

## 2019-07-29 NOTE — Telephone Encounter (Signed)
Done

## 2019-08-04 DIAGNOSIS — R0902 Hypoxemia: Secondary | ICD-10-CM | POA: Diagnosis not present

## 2019-08-04 DIAGNOSIS — E119 Type 2 diabetes mellitus without complications: Secondary | ICD-10-CM | POA: Diagnosis not present

## 2019-08-04 DIAGNOSIS — R600 Localized edema: Secondary | ICD-10-CM | POA: Diagnosis not present

## 2019-08-04 DIAGNOSIS — K219 Gastro-esophageal reflux disease without esophagitis: Secondary | ICD-10-CM | POA: Diagnosis not present

## 2019-08-04 DIAGNOSIS — J431 Panlobular emphysema: Secondary | ICD-10-CM | POA: Diagnosis not present

## 2019-08-04 DIAGNOSIS — I272 Pulmonary hypertension, unspecified: Secondary | ICD-10-CM | POA: Diagnosis not present

## 2019-08-04 DIAGNOSIS — E782 Mixed hyperlipidemia: Secondary | ICD-10-CM | POA: Diagnosis not present

## 2019-08-04 DIAGNOSIS — R0602 Shortness of breath: Secondary | ICD-10-CM | POA: Diagnosis not present

## 2019-08-04 DIAGNOSIS — R001 Bradycardia, unspecified: Secondary | ICD-10-CM | POA: Diagnosis not present

## 2019-08-04 DIAGNOSIS — I2510 Atherosclerotic heart disease of native coronary artery without angina pectoris: Secondary | ICD-10-CM | POA: Diagnosis not present

## 2019-08-04 DIAGNOSIS — Z8619 Personal history of other infectious and parasitic diseases: Secondary | ICD-10-CM | POA: Diagnosis not present

## 2019-08-08 DIAGNOSIS — I639 Cerebral infarction, unspecified: Secondary | ICD-10-CM

## 2019-08-08 DIAGNOSIS — G4733 Obstructive sleep apnea (adult) (pediatric): Secondary | ICD-10-CM | POA: Diagnosis not present

## 2019-08-08 HISTORY — DX: Cerebral infarction, unspecified: I63.9

## 2019-08-15 NOTE — Progress Notes (Deleted)
Patient: Teresa [name] Female    DOB: May 22, 1946   74 y.o.   MRN: [id number] Visit Date: 08/15/2019  Today's Provider: Wilhemena Durie, MD   No chief complaint on file.  Subjective:     HPI   History of 2019 novel coronavirus disease (COVID-19) From 07/15/2019-Patient is slowly recovering. We will tentatively plan on her going back to work September 08, 2019 but I am not sure at 28 that she will be able to do this.   Diabetes mellitus type II, non insulin dependent ([place]) From 07/15/2019-A1c on next visit.  Pneumonia due to COVID-19 virus From 07/15/2019-Consider chest x-ray or CT of her chest or pulmonary referral on next visit.   Allergies  Allergen Reactions  . Bacitracin-Neomycin-Polymyxin   . Clarithromycin Other (See Comments), Nausea Only and Nausea And Vomiting  . Codeine     Other reaction(s): Hallucination  . Dilaudid  [Hydromorphone Hcl] Nausea And Vomiting  . Iodine   . Iohexol      Desc: PT HAS HIVES/ITCHING   . Neomycin-Bacitracin Zn-Polymyx   . Tamiflu  [Oseltamivir Phosphate]     Other reaction(s): Abdominal Pain, Vomiting  . Zolpidem Nausea And Vomiting and Other (See Comments)    Hallucinations  . Benzalkonium Chloride Itching, Rash and Swelling  . Lidocaine Hcl Itching, Rash and Swelling  . Morphine Nausea Only, Rash and Nausea And Vomiting  . Tape Itching and Rash  . Tapentadol Rash     Current Outpatient Medications:  .  acetaminophen (TYLENOL) 500 MG tablet, Take 500 mg by mouth every 6 (six) hours as needed for mild pain or headache., Disp: , Rfl:  .  allopurinol (ZYLOPRIM) 100 MG tablet, TAKE 1 TABLET BY MOUTH EVERY DAY, Disp: 90 tablet, Rfl: 3 .  atorvastatin (LIPITOR) 10 MG tablet, TAKE 1 TABLET BY MOUTH EVERY DAY AT BEDTIME (Patient taking differently: Take 10 mg by mouth at bedtime. ), Disp: 90 tablet, Rfl: 3 .  benzonatate (TESSALON) 200 MG capsule, Take 1 capsule (200 mg total) by mouth 3 (three) times daily. (Patient not  taking: Reported on 06/24/2019), Disp: , Rfl: 0 .  colchicine 0.6 MG tablet, Take 0.6 mg by mouth daily as needed (for gout flare-ups). , Disp: , Rfl:  .  Continuous Blood Gluc Sensor (FREESTYLE LIBRE 14 DAY SENSOR) MISC, USE EVERY 14 DAYS AS DIRECTED, Disp: 2 each, Rfl: 12 .  furosemide (LASIX) 40 MG tablet, TAKE 1 TABLET BY MOUTH EVERY DAY, Disp: 90 tablet, Rfl: 3 .  gabapentin (NEURONTIN) 400 MG capsule, Take 1 capsule (400 mg total) by mouth 3 (three) times daily., Disp: 270 capsule, Rfl: 3 .  glimepiride (AMARYL) 4 MG tablet, TAKE 1 TABLET (4 MG TOTAL) BY MOUTH 2 (TWO) TIMES DAILY., Disp: 180 tablet, Rfl: 3 .  glucose blood test strip, Check blood sugar daily. Use as instructed, Disp: 100 each, Rfl: 12 .  guaiFENesin-dextromethorphan (ROBITUSSIN DM) 100-10 MG/5ML syrup, Take 5 mLs by mouth every 4 (four) hours as needed for cough., Disp: 118 mL, Rfl: 0 .  JARDIANCE 25 MG TABS tablet, TAKE 25 MG BY MOUTH DAILY., Disp: 90 tablet, Rfl: 3 .  levothyroxine (SYNTHROID, LEVOTHROID) 88 MCG tablet, TAKE 1 TABLET BY MOUTH EVERY DAY (Patient taking differently: Take 88 mcg by mouth daily before breakfast. ), Disp: 90 tablet, Rfl: 3 .  losartan (COZAAR) 25 MG tablet, Take 1 tablet (25 mg total) by mouth daily., Disp: 90 tablet, Rfl: 3 .  menthol-cetylpyridinium (CEPACOL)  3 MG lozenge, Take 1 lozenge (3 mg total) by mouth as needed for sore throat., Disp: , Rfl:  .  metFORMIN (GLUCOPHAGE) 1000 MG tablet, TAKE 1 TABLET BY MOUTH TWICE A DAY, Disp: 180 tablet, Rfl: 3 .  nystatin cream (MYCOSTATIN), Apply 1 application topically 2 (two) times daily. (Patient taking differently: Apply 1 application topically 2 (two) times daily as needed (for diabetic yeast reactions.). ), Disp: 30 g, Rfl: 0 .  ondansetron (ZOFRAN) 4 MG tablet, Take 1 tablet (4 mg total) by mouth every 8 (eight) hours as needed for nausea or vomiting., Disp: 45 tablet, Rfl: 0 .  ONE TOUCH LANCETS MISC, 1 each by Does not apply route daily., Disp:  30 each, Rfl: 12 .  pioglitazone (ACTOS) 30 MG tablet, TAKE 1 TABLET BY MOUTH EVERY DAY (Patient taking differently: Take 30 mg by mouth daily. ), Disp: 90 tablet, Rfl: 3 .  Semaglutide,0.25 or 0.5MG /DOS, (OZEMPIC, 0.25 OR 0.5 MG/DOSE,) 2 MG/1.5ML SOPN, Inject 0.5 Syringes into the skin once a week., Disp: 1.5 mL, Rfl: 11  Review of Systems  Constitutional: Negative for appetite change, chills, fatigue and fever.  Respiratory: Negative for chest tightness and shortness of breath.   Cardiovascular: Negative for chest pain and palpitations.  Gastrointestinal: Negative for abdominal pain, nausea and vomiting.  Neurological: Negative for dizziness and weakness.    Social History   Tobacco Use  . Smoking status: Never Smoker  . Smokeless tobacco: Never Used  Substance Use Topics  . Alcohol use: No    Alcohol/week: 0.0 standard drinks      Objective:   There were no vitals taken for this visit. There were no vitals filed for this visit.There is no height or weight on file to calculate BMI.   Physical Exam   No results found for any visits on 08/18/19.     Assessment & Plan        Wilhemena Durie, MD  [facility]

## 2019-08-18 ENCOUNTER — Ambulatory Visit: Payer: Self-pay | Admitting: Family Medicine

## 2019-08-19 ENCOUNTER — Telehealth: Payer: Self-pay

## 2019-08-19 NOTE — Telephone Encounter (Signed)
Called and spoke with the patient. According to the patient Dr. Dellis Filbert works for [facility] and wants to confer with you about her treatment.

## 2019-08-19 NOTE — Telephone Encounter (Signed)
Please advise 

## 2019-08-19 NOTE — Telephone Encounter (Signed)
Who is Dr Dellis Filbert?

## 2019-08-19 NOTE — Telephone Encounter (Signed)
Copied from [place] [id number]. Topic: General - Other >> Aug 19, 2019 12:29 PM Leward Quan A wrote: Reason for CRM: Dr Dellis Filbert called to speak to Dr Rosanna Randy on behalf of their mutual patient. Say that it was ok to call back when ever Dr Rosanna Randy have a moment. Can be reached at Ph# [phone]

## 2019-08-21 ENCOUNTER — Other Ambulatory Visit: Payer: Self-pay | Admitting: Family Medicine

## 2019-08-21 DIAGNOSIS — R110 Nausea: Secondary | ICD-10-CM

## 2019-08-26 ENCOUNTER — Ambulatory Visit: Payer: Self-pay | Admitting: Family Medicine

## 2019-08-29 DIAGNOSIS — G4733 Obstructive sleep apnea (adult) (pediatric): Secondary | ICD-10-CM | POA: Diagnosis not present

## 2019-09-01 ENCOUNTER — Other Ambulatory Visit: Payer: Self-pay | Admitting: Family Medicine

## 2019-09-01 DIAGNOSIS — I10 Essential (primary) hypertension: Secondary | ICD-10-CM

## 2019-09-01 MED ORDER — LOSARTAN POTASSIUM 25 MG PO TABS: 25.0000 mg | ORAL_TABLET | Freq: Every day | ORAL | 3 refills | Status: DC

## 2019-09-01 NOTE — Telephone Encounter (Signed)
Patient advised.

## 2019-09-01 NOTE — Telephone Encounter (Signed)
Patient would like to know why this medication was denied. Patient is out of medication.

## 2019-09-01 NOTE — Telephone Encounter (Signed)
When Teresa [name] was in the hospital she was on to 25 mg of Losartan.  Her d/c paperwork said 25 mg.  She said she has been taking 50 mg but the bottle she has at home is an old one.  It appears she may have had some refills left on the 50 mg bottle and got that filled instead of the 25mg .  I asked her if she had her BP checked recently and she said it was normal but did not give an actual reading.  When asked if she felt ok, if she had any dizziness or feeling like it dropped when she would get up she said no.   She is out of med and needs something to be sent in.  Do you want her to just continue the 50 or go on the 25 as was ordered from the hospital admission?  Thanks

## 2019-09-01 NOTE — Addendum Note (Signed)
Addended by: Gerald Stabs on: 09/01/2019 11:32 AM   Modules accepted: Orders

## 2019-09-01 NOTE — Telephone Encounter (Signed)
Start with 25 mg--thx

## 2019-09-02 ENCOUNTER — Telehealth: Payer: Self-pay

## 2019-09-02 DIAGNOSIS — K219 Gastro-esophageal reflux disease without esophagitis: Secondary | ICD-10-CM | POA: Diagnosis not present

## 2019-09-02 DIAGNOSIS — R600 Localized edema: Secondary | ICD-10-CM | POA: Diagnosis not present

## 2019-09-02 DIAGNOSIS — R0902 Hypoxemia: Secondary | ICD-10-CM | POA: Diagnosis not present

## 2019-09-02 DIAGNOSIS — I272 Pulmonary hypertension, unspecified: Secondary | ICD-10-CM | POA: Diagnosis not present

## 2019-09-02 DIAGNOSIS — Z8616 Personal history of COVID-19: Secondary | ICD-10-CM | POA: Diagnosis not present

## 2019-09-02 DIAGNOSIS — I2510 Atherosclerotic heart disease of native coronary artery without angina pectoris: Secondary | ICD-10-CM | POA: Diagnosis not present

## 2019-09-02 DIAGNOSIS — E119 Type 2 diabetes mellitus without complications: Secondary | ICD-10-CM | POA: Diagnosis not present

## 2019-09-02 DIAGNOSIS — E782 Mixed hyperlipidemia: Secondary | ICD-10-CM | POA: Diagnosis not present

## 2019-09-02 DIAGNOSIS — R001 Bradycardia, unspecified: Secondary | ICD-10-CM | POA: Diagnosis not present

## 2019-09-02 DIAGNOSIS — J431 Panlobular emphysema: Secondary | ICD-10-CM | POA: Diagnosis not present

## 2019-09-02 DIAGNOSIS — R0602 Shortness of breath: Secondary | ICD-10-CM | POA: Diagnosis not present

## 2019-09-02 NOTE — Telephone Encounter (Signed)
Copied from [place] [id number]. Topic: General - Inquiry >> Sep 02, 2019 11:19 AM Alease Frame wrote: Reason for CRM: patient called requesting an extension for her FMLA paperwork and to have it faxed over to job. Patient states she will provide fax number when she gets a call back from someone in office . Please advise .

## 2019-09-03 NOTE — Telephone Encounter (Signed)
This encounter was created in error - please disregard.

## 2019-09-03 NOTE — Telephone Encounter (Signed)
LMOVM for pt to cb. Dr. Rosanna Randy is agreeable to doing the extension on FMLA form. Per Dr. Rosanna Randy, we just need another form from the patient.

## 2019-09-09 NOTE — Progress Notes (Signed)
Subjective:   Teresa [name] is a 74 y.o. female who presents for Medicare Annual (Subsequent) preventive examination.    This visit is being conducted through telemedicine due to the COVID-19 pandemic. This patient has given me verbal consent via doximity to conduct this visit, patient states they are participating from their home address. Some vital signs may be absent or patient reported.    Patient identification: identified by name, DOB, and current address  Review of Systems:  N/A  Cardiac Risk Factors include: advanced age (>65men, >49 women);diabetes mellitus;dyslipidemia;hypertension     Objective:     Vitals: There were no vitals taken for this visit.  There is no height or weight on file to calculate BMI. Unable to obtain vitals due to visit being conducted via telephonically.   Advanced Directives 09/10/2019 05/17/2019 05/16/2019 09/27/2018 01/19/2017 08/28/2016 12/08/2015  Does Patient Have a Medical Advance Directive? Yes Yes Yes Yes Yes Yes Yes  Type of Paramedic of [place];Living will [place];Living will [place];Living will - Living will;Healthcare Power of Attorney Living will [place];Living will  Does patient want to make changes to medical advance directive? - No - Patient declined - - - - -  Copy of [place] in Chart? Yes - validated most recent copy scanned in chart (See row information) No - copy requested No - copy requested - No - copy requested - -    Tobacco Social History   Tobacco Use  Smoking Status Never Smoker  Smokeless Tobacco Never Used     Counseling given: Not Answered   Clinical Intake:  Pre-visit preparation completed: Yes  Pain : 0-10 Pain Score: 7  Pain Type: Chronic pain(Due to exertion. Cardiologist referred to a pulmonologist for issue.) Pain Location: Chest Pain Orientation: Left Pain Descriptors / Indicators: Aching Pain  Frequency: Intermittent     Nutritional Risks: None Diabetes: Yes  How often do you need to have someone help you when you read instructions, pamphlets, or other written materials from your doctor or pharmacy?: 1 - Never   Diabetes:  Is the patient diabetic?  Yes  If diabetic, was a CBG obtained today?  No  Did the patient bring in their glucometer from home?  No  How often do you monitor your CBG's? Twice a day.   Financial Strains and Diabetes Management:  Are you having any financial strains with the device, your supplies or your medication? No .  Does the patient want to be seen by Chronic Care Management for management of their diabetes?  No  Would the patient like to be referred to a Nutritionist or for Diabetic Management?  No   Diabetic Exams:  Diabetic Eye Exam: Completed 07/07/19. Repeat yearly.  Diabetic Foot Exam: Completed 05/20/18. Pt has been advised about the importance in completing this exam. Note made to follow up on this at next in office apt.     Interpreter Needed?: No  Information entered by :: [facility], LPN  Past Medical History:  Diagnosis Date  . Complication of anesthesia   . Depression   . Diabetes mellitus without complication ([place])   . Dyspnea    DOE  . Edema    FEET/LEGS  . GERD (gastroesophageal reflux disease)   . Gout   . Headache   . Heart murmur   . History of hiatal hernia   . History of orthopnea   . Hypertension   . Hypothyroidism   .  PONV (postoperative nausea and vomiting)   . 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 Sleep apnea    NO CPAP  . Thyroid disease   . Vertigo    Past Surgical History:  Procedure Laterality Date  . ABDOMINAL HYSTERECTOMY     ovaries intact  . BLADDER SURGERY     bladder tuck  . BREAST BIOPSY Right 03/20/2016   path pending x 2 area  . CARDIAC CATHETERIZATION    . CARPAL TUNNEL RELEASE    . CATARACT EXTRACTION W/PHACO Left 09/17/2018   Procedure: CATARACT EXTRACTION PHACO AND INTRAOCULAR LENS  PLACEMENT (IOC) LEFT, DIABETIC;  Surgeon: Birder Robson, MD;  Location: [facility] ORS;  Service: Ophthalmology;  Laterality: Left;  [place] 00:34 CDE 4.85 Fluid pack lot # [id number]  . CATARACT EXTRACTION W/PHACO Right 10/15/2018   Procedure: CATARACT EXTRACTION PHACO AND INTRAOCULAR LENS PLACEMENT (IOC)-RIGHT;  Surgeon: Birder Robson, MD;  Location: [facility] ORS;  Service: Ophthalmology;  Laterality: Right;  [place] 00:27.6 CDE 3.43 Fluid Pack Lot # [id number]  . COLONOSCOPY WITH PROPOFOL N/A 09/27/2018   Procedure: COLONOSCOPY WITH PROPOFOL;  Surgeon: Virgel Manifold, MD;  Location: [facility] ENDOSCOPY;  Service: Endoscopy;  Laterality: N/A;  . DILATION AND CURETTAGE OF UTERUS    . EYE SURGERY     eyelid  . FRACTURE SURGERY     left ankle-plate and screws palced  . LITHOTRIPSY    . NISSEN FUNDOPLICATION    . SHOULDER SURGERY Right   . TONSILLECTOMY     Family History  Problem Relation Age of Onset  . Stroke Mother   . Hypertension Mother   . Heart disease Mother   . Arthritis Mother   . Heart disease Father   . Hypertension Father   . Diabetes Sister   . Hypertension Sister   . Asthma Sister   . Hypertension Sister   . Diverticulitis Sister   . Colon cancer Maternal Grandmother   . Breast cancer Maternal Grandmother   . Breast cancer Maternal Aunt    Social History   Socioeconomic History  . Marital status: Divorced    Spouse name: Not on file  . Number of children: 2  . Years of education: Not on file  . Highest education level: Associate degree: occupational, Hotel manager, or vocational program  Occupational History  . Not on file  Tobacco Use  . Smoking status: Never Smoker  . Smokeless tobacco: Never Used  Substance and Sexual Activity  . Alcohol use: No    Alcohol/week: 0.0 standard drinks  . Drug use: Never  . Sexual activity: Not on file  Other Topics Concern  . Not on file  Social History Narrative  . Not on file   Social Determinants of Health   Financial Resource  Strain: Low Risk   . Difficulty of Paying Living Expenses: Not hard at all  Food Insecurity: No Food Insecurity  . Worried About Charity fundraiser in the Last Year: Never true  . Ran Out of Food in the Last Year: Never true  Transportation Needs: No Transportation Needs  . Lack of Transportation (Medical): No  . Lack of Transportation (Non-Medical): No  Physical Activity: Insufficiently Active  . Days of Exercise per Week: 7 days  . Minutes of Exercise per Session: 20 min  Stress: Stress Concern Present  . Feeling of Stress : To some extent  Social Connections: Somewhat Isolated  . Frequency of Communication with Friends and Family: More than three times a week  .  Frequency of Social Gatherings with Friends and Family: Once a week  . Attends Religious Services: More than 4 times per year  . Active Member of Clubs or Organizations: No  . Attends Archivist Meetings: Never  . Marital Status: Divorced    Outpatient Encounter Medications as of 09/10/2019  Medication Sig  . acetaminophen (TYLENOL) 500 MG tablet Take 500 mg by mouth every 6 (six) hours as needed for mild pain or headache.  . allopurinol (ZYLOPRIM) 100 MG tablet TAKE 1 TABLET BY MOUTH EVERY DAY  . atorvastatin (LIPITOR) 10 MG tablet TAKE 1 TABLET BY MOUTH EVERY DAY AT BEDTIME (Patient taking differently: Take 10 mg by mouth at bedtime. )  . colchicine 0.6 MG tablet Take 0.6 mg by mouth daily as needed (for gout flare-ups).   . Continuous Blood Gluc Sensor (FREESTYLE LIBRE 14 DAY SENSOR) MISC USE EVERY 14 DAYS AS DIRECTED  . furosemide (LASIX) 40 MG tablet TAKE 1 TABLET BY MOUTH EVERY DAY  . gabapentin (NEURONTIN) 400 MG capsule Take 1 capsule (400 mg total) by mouth 3 (three) times daily.  Marland Kitchen glimepiride (AMARYL) 4 MG tablet TAKE 1 TABLET (4 MG TOTAL) BY MOUTH 2 (TWO) TIMES DAILY.  Marland Kitchen glucose blood test strip Check blood sugar daily. Use as instructed  . JARDIANCE 25 MG TABS tablet TAKE 25 MG BY MOUTH DAILY.  Marland Kitchen  levothyroxine (SYNTHROID, LEVOTHROID) 88 MCG tablet TAKE 1 TABLET BY MOUTH EVERY DAY (Patient taking differently: Take 88 mcg by mouth daily before breakfast. )  . losartan (COZAAR) 25 MG tablet Take 1 tablet (25 mg total) by mouth daily.  . metFORMIN (GLUCOPHAGE) 1000 MG tablet TAKE 1 TABLET BY MOUTH TWICE A DAY  . nystatin cream (MYCOSTATIN) Apply 1 application topically 2 (two) times daily. (Patient taking differently: Apply 1 application topically 2 (two) times daily as needed (for diabetic yeast reactions.). )  . ondansetron (ZOFRAN) 4 MG tablet TAKE 1 TABLET BY MOUTH EVERY 8 HOURS AS NEEDED FOR NAUSEA AND VOMITING  . ONE TOUCH LANCETS MISC 1 each by Does not apply route daily.  . pioglitazone (ACTOS) 30 MG tablet TAKE 1 TABLET BY MOUTH EVERY DAY (Patient taking differently: Take 30 mg by mouth daily. )  . Semaglutide,0.25 or 0.5MG /DOS, (OZEMPIC, 0.25 OR 0.5 MG/DOSE,) 2 MG/1.5ML SOPN Inject 0.5 Syringes into the skin once a week.  . benzonatate (TESSALON) 200 MG capsule Take 1 capsule (200 mg total) by mouth 3 (three) times daily. (Patient not taking: Reported on 06/24/2019)  . guaiFENesin-dextromethorphan (ROBITUSSIN DM) 100-10 MG/5ML syrup Take 5 mLs by mouth every 4 (four) hours as needed for cough. (Patient not taking: Reported on 09/10/2019)  . menthol-cetylpyridinium (CEPACOL) 3 MG lozenge Take 1 lozenge (3 mg total) by mouth as needed for sore throat. (Patient not taking: Reported on 09/10/2019)   No facility-administered encounter medications on file as of 09/10/2019.    Activities of Daily Living In your present state of health, do you have any difficulty performing the following activities: 09/10/2019 05/17/2019  Hearing? N N  Vision? N N  Comment - -  Difficulty concentrating or making decisions? N N  Walking or climbing stairs? Y N  Comment Due to SOB. -  Dressing or bathing? N N  Doing errands, shopping? Y N  Comment Currently not driving. -  Preparing Food and eating ? N -   Using the Toilet? N -  In the past six months, have you accidently leaked urine? N -  Do you  have problems with loss of bowel control? N -  Managing your Medications? N -  Managing your Finances? N -  Housekeeping or managing your Housekeeping? N -  Some recent data might be hidden    Patient Care Team: [name], MD as PCP - General (Family Medicine) Dingeldein, Remo Lipps, MD as Consulting Physician (Ophthalmology) Gardiner Barefoot, DPM as Consulting Physician (Podiatry) Yolonda Kida, MD as Consulting Physician (Cardiology)    Assessment:   This is a routine wellness examination for Teresa [name].  Exercise Activities and Dietary recommendations Current Exercise Habits: The patient does not participate in regular exercise at present, Exercise limited by: orthopedic condition(s)  Goals    . Weight (lb) < 140 lb (63.5 kg)     Recommend to continue to monitor food intake and keep walking daily to help aid with weight loss and control diabetes.        Fall Risk: Fall Risk  09/10/2019 09/12/2018 05/20/2018 01/19/2017 12/08/2015  Falls in the past year? 1 1 Yes Yes Yes  Number falls in past yr: 1 1 2  or more 2 or more 2 or more  Comment - - - - 6-10  Injury with Fall? 0 0 No No No  Risk Factor Category  - - - - -  Comment - - - - -  Risk for fall due to : Other (Comment) History of fall(s);Impaired balance/gait - - Impaired balance/gait  Risk for fall due to: Comment Due to low heart rate post covid-19. - - - -  Follow up Falls prevention discussed Falls evaluation completed;Education provided - (No Data) -  Comment - Patient has been being followed by neurology - currently being evaluated by Dr Melrose Nakayama -    FALL RISK PREVENTION PERTAINING TO THE HOME:  Any stairs in or around the home? No  If so, are there any without handrails? N/A  Home free of loose throw rugs in walkways, pet beds, electrical cords, etc? Yes  Adequate lighting in your home to reduce risk of falls? Yes    ASSISTIVE DEVICES UTILIZED TO PREVENT FALLS:  Life alert? No  Use of a cane, walker or w/c? Yes  Grab bars in the bathroom? Yes  Shower chair or bench in shower? No  Elevated toilet seat or a handicapped toilet? Yes   TIMED UP AND GO:  Was the test performed? No .    Depression Screen PHQ 2/9 Scores 09/10/2019 01/14/2019 09/12/2018 01/19/2017  PHQ - 2 Score 0 1 1 0  PHQ- 9 Score - - 5 4     Cognitive Function: Declined today.      6CIT Screen 01/19/2017  What Year? 0 points  What month? 0 points  What time? 0 points  Count back from 20 0 points  Months in reverse 0 points  Repeat phrase 0 points  Total Score 0    Immunization History  Administered Date(s) Administered  . Fluad Quad(high Dose 65+) 05/27/2019  . Influenza, High Dose Seasonal PF 05/29/2016, 06/06/2017, 05/20/2018  . Pneumococcal Conjugate-13 05/21/2014  . Pneumococcal Polysaccharide-23 11/29/2004, 07/11/2010, 12/08/2015, 05/27/2019  . Td 07/05/1994  . Tdap 05/11/2011  . Zoster 05/07/2008    Qualifies for Shingles Vaccine? Yes  Zostavax completed 05/07/08. Due for Shingrix. Pt has been advised to call insurance company to determine out of pocket expense. Advised may also receive vaccine at local pharmacy or Health Dept. Verbalized acceptance and understanding.  Tdap: Up to date  Flu Vaccine: Up to date  Pneumococcal Vaccine:  Completed series   Screening Tests Health Maintenance  Topic Date Due  . MAMMOGRAM  12/09/2017  . FOOT EXAM  05/21/2019  . HEMOGLOBIN A1C  11/15/2019  . OPHTHALMOLOGY EXAM  07/06/2020  . TETANUS/TDAP  05/10/2021  . COLONOSCOPY  09/28/2023  . INFLUENZA VACCINE  Completed  . DEXA SCAN  Completed  . Hepatitis C Screening  Completed  . PNA vac Low Risk Adult  Completed    Cancer Screenings:  Colorectal Screening: Completed 09/27/18. Repeat every 5 years.   Mammogram: Completed 12/10/15. Repeat every 1-2 years as advised. Ordered today. Pt provided with contact info and advised  to call to schedule appt. Pt aware the office will call re: appt.  Bone Density: Completed 02/24/08. Results reflect NORMAL. No repeat needed unless advised by a physician.  Lung Cancer Screening: (Low Dose CT Chest recommended if Age 74-80 years, 30 pack-year currently smoking OR have quit w/in 15years.) does not qualify.   Additional Screening:  Hepatitis C Screening: Up to date  Dental Screening: Recommended annual dental exams for proper oral hygiene   Community Resource Referral:  CRR required this visit?  No       Plan:  I have personally reviewed and addressed the Medicare Annual Wellness questionnaire and have noted the following in the patient's chart:  A. Medical and social history B. Use of alcohol, tobacco or illicit drugs  C. Current medications and supplements D. Functional ability and status E.  Nutritional status F.  Physical activity G. Advance directives H. List of other physicians I.  Hospitalizations, surgeries, and ER visits in previous 12 months J.  [place] such as hearing and vision if needed, cognitive and depression L. Referrals and appointments   In addition, I have reviewed and discussed with patient certain preventive protocols, quality metrics, and best practice recommendations. A written personalized care plan for preventive services as well as general preventive health recommendations were provided to patient.   [place]endora Score, [place]  [id number] Nurse Health Advisor   Nurse Notes: Pt needs a diabetic foot exam at next in office visit.

## 2019-09-10 ENCOUNTER — Ambulatory Visit (INDEPENDENT_AMBULATORY_CARE_PROVIDER_SITE_OTHER): Payer: PPO

## 2019-09-10 ENCOUNTER — Other Ambulatory Visit: Payer: Self-pay

## 2019-09-10 DIAGNOSIS — Z1231 Encounter for screening mammogram for malignant neoplasm of breast: Secondary | ICD-10-CM | POA: Diagnosis not present

## 2019-09-10 DIAGNOSIS — Z0000 Encounter for general adult medical examination without abnormal findings: Secondary | ICD-10-CM | POA: Diagnosis not present

## 2019-09-10 NOTE — Patient Instructions (Signed)
Teresa [name] , Thank you for taking time to come for your Medicare Wellness Visit. I appreciate your ongoing commitment to your health goals. Please review the following plan we discussed and let me know if I can assist you in the future.   Screening recommendations/referrals: Colonoscopy: Up to date, due 09/2023 Mammogram: Ordered today. Pt provided with contact info and advised to call to schedule appt.  Bone Density: Up to date. Previous DEXA scan was normal. No repeat needed unless advised by a physician. Recommended yearly ophthalmology/optometry visit for glaucoma screening and checkup Recommended yearly dental visit for hygiene and checkup  Vaccinations: Influenza vaccine: Up to date Pneumococcal vaccine: Completed series Tdap vaccine: Up to date, due 05/2021 Shingles vaccine: Pt declines today.     Advanced directives: Currently on file.  Conditions/risks identified: Recommend to continue to monitor food intake and keep walking daily to help aid with weight loss and control diabetes.   Next appointment: 09/18/19 @ 9:40 AM with Dr Rosanna Randy. Declined scheduling an AWV for 2022 at this time.    Preventive Care 22 Years and Older, Female Preventive care refers to lifestyle choices and visits with your health care provider that can promote health and wellness. What does preventive care include?  A yearly physical exam. This is also called an annual well check.  Dental exams once or twice a year.  Routine eye exams. Ask your health care provider how often you should have your eyes checked.  Personal lifestyle choices, including:  Daily care of your teeth and gums.  Regular physical activity.  Eating a healthy diet.  Avoiding tobacco and drug use.  Limiting alcohol use.  Practicing safe sex.  Taking low-dose aspirin every day.  Taking vitamin and mineral supplements as recommended by your health care provider. What happens during an annual well check? The services and  screenings done by your health care provider during your annual well check will depend on your age, overall health, lifestyle risk factors, and family history of disease. Counseling  Your health care provider may ask you questions about your:  Alcohol use.  Tobacco use.  Drug use.  Emotional well-being.  Home and relationship well-being.  Sexual activity.  Eating habits.  History of falls.  Memory and ability to understand (cognition).  Work and work Statistician.  Reproductive health. Screening  You may have the following tests or measurements:  Height, weight, and BMI.  Blood pressure.  Lipid and cholesterol levels. These may be checked every 5 years, or more frequently if you are over 42 years old.  Skin check.  Lung cancer screening. You may have this screening every year starting at age 76 if you have a 30-pack-year history of smoking and currently smoke or have quit within the past 15 years.  Fecal occult blood test (FOBT) of the stool. You may have this test every year starting at age 35.  Flexible sigmoidoscopy or colonoscopy. You may have a sigmoidoscopy every 5 years or a colonoscopy every 10 years starting at age 27.  Hepatitis C blood test.  Hepatitis B blood test.  Sexually transmitted disease (STD) testing.  Diabetes screening. This is done by checking your blood sugar (glucose) after you have not eaten for a while (fasting). You may have this done every 1-3 years.  Bone density scan. This is done to screen for osteoporosis. You may have this done starting at age 36.  Mammogram. This may be done every 1-2 years. Talk to your health care provider about how  often you should have regular mammograms. Talk with your health care provider about your test results, treatment options, and if necessary, the need for more tests. Vaccines  Your health care provider may recommend certain vaccines, such as:  Influenza vaccine. This is recommended every  year.  Tetanus, diphtheria, and acellular pertussis (Tdap, Td) vaccine. You may need a Td booster every 10 years.  Zoster vaccine. You may need this after age 3.  Pneumococcal 13-valent conjugate (PCV13) vaccine. One dose is recommended after age 64.  Pneumococcal polysaccharide (PPSV23) vaccine. One dose is recommended after age 89. Talk to your health care provider about which screenings and vaccines you need and how often you need them. This information is not intended to replace advice given to you by your health care provider. Make sure you discuss any questions you have with your health care provider. Document Released: 08/20/2015 Document Revised: 04/12/2016 Document Reviewed: 05/25/2015 Elsevier Interactive Patient Education  2017 [place] Prevention in the Home Falls can cause injuries. They can happen to people of all ages. There are many things you can do to make your home safe and to help prevent falls. What can I do on the outside of my home?  Regularly fix the edges of walkways and driveways and fix any cracks.  Remove anything that might make you trip as you walk through a door, such as a raised step or threshold.  Trim any bushes or trees on the path to your home.  Use bright outdoor lighting.  Clear any walking paths of anything that might make someone trip, such as rocks or tools.  Regularly check to see if handrails are loose or broken. Make sure that both sides of any steps have handrails.  Any raised decks and porches should have guardrails on the edges.  Have any leaves, snow, or ice cleared regularly.  Use sand or salt on walking paths during winter.  Clean up any spills in your garage right away. This includes oil or grease spills. What can I do in the bathroom?  Use night lights.  Install grab bars by the toilet and in the tub and shower. Do not use towel bars as grab bars.  Use non-skid mats or decals in the tub or shower.  If you  need to sit down in the shower, use a plastic, non-slip stool.  Keep the floor dry. Clean up any water that spills on the floor as soon as it happens.  Remove soap buildup in the tub or shower regularly.  Attach bath mats securely with double-sided non-slip rug tape.  Do not have throw rugs and other things on the floor that can make you trip. What can I do in the bedroom?  Use night lights.  Make sure that you have a light by your bed that is easy to reach.  Do not use any sheets or blankets that are too big for your bed. They should not hang down onto the floor.  Have a firm chair that has side arms. You can use this for support while you get dressed.  Do not have throw rugs and other things on the floor that can make you trip. What can I do in the kitchen?  Clean up any spills right away.  Avoid walking on wet floors.  Keep items that you use a lot in easy-to-reach places.  If you need to reach something above you, use a strong step stool that has a grab bar.  Keep electrical  cords out of the way.  Do not use floor polish or wax that makes floors slippery. If you must use wax, use non-skid floor wax.  Do not have throw rugs and other things on the floor that can make you trip. What can I do with my stairs?  Do not leave any items on the stairs.  Make sure that there are handrails on both sides of the stairs and use them. Fix handrails that are broken or loose. Make sure that handrails are as long as the stairways.  Check any carpeting to make sure that it is firmly attached to the stairs. Fix any carpet that is loose or worn.  Avoid having throw rugs at the top or bottom of the stairs. If you do have throw rugs, attach them to the floor with carpet tape.  Make sure that you have a light switch at the top of the stairs and the bottom of the stairs. If you do not have them, ask someone to add them for you. What else can I do to help prevent falls?  Wear shoes  that:  Do not have high heels.  Have rubber bottoms.  Are comfortable and fit you well.  Are closed at the toe. Do not wear sandals.  If you use a stepladder:  Make sure that it is fully opened. Do not climb a closed stepladder.  Make sure that both sides of the stepladder are locked into place.  Ask someone to hold it for you, if possible.  Clearly mark and make sure that you can see:  Any grab bars or handrails.  First and last steps.  Where the edge of each step is.  Use tools that help you move around (mobility aids) if they are needed. These include:  Canes.  Walkers.  Scooters.  Crutches.  Turn on the lights when you go into a dark area. Replace any light bulbs as soon as they burn out.  Set up your furniture so you have a clear path. Avoid moving your furniture around.  If any of your floors are uneven, fix them.  If there are any pets around you, be aware of where they are.  Review your medicines with your doctor. Some medicines can make you feel dizzy. This can increase your chance of falling. Ask your doctor what other things that you can do to help prevent falls. This information is not intended to replace advice given to you by your health care provider. Make sure you discuss any questions you have with your health care provider. Document Released: 05/20/2009 Document Revised: 12/30/2015 Document Reviewed: 08/28/2014 Elsevier Interactive Patient Education  2017 [employer].

## 2019-09-11 DIAGNOSIS — R0600 Dyspnea, unspecified: Secondary | ICD-10-CM | POA: Diagnosis not present

## 2019-09-12 ENCOUNTER — Other Ambulatory Visit: Payer: Self-pay | Admitting: Pulmonary Disease

## 2019-09-12 DIAGNOSIS — R0600 Dyspnea, unspecified: Secondary | ICD-10-CM

## 2019-09-15 NOTE — Telephone Encounter (Signed)
Patient has ov appt 09/18/2019.

## 2019-09-17 NOTE — Progress Notes (Signed)
Patient: Teresa [name], Female    DOB: 27-Jun-1946, 74 y.o.   MRN: [id number] Visit Date: 09/18/2019  Today's Provider: Wilhemena Durie, MD   Chief Complaint  Patient presents with  . Annual Exam   Subjective:     Patient had AWV with [facility] 09/10/2019.   Complete Physical Teresa [name] is a 74 y.o. female. She feels fairly well. She reports she is exercising. She reports she is sleeping fairly well. Patient still with significant fatigue from Covid.  She is slowly getting her strength back.  Presently when she walks with a walker her pulse ox drops to 88 and her heart rate is about 42.  Slowly improving. She is a Surveyor, minerals, she has 2 sons 6 grandchildren and 3 great grandchildren.  She worked at [employer] before her Covid infection.  We are still filling out disability work with the hope that she can go back to work.  She states that financially she needs to work. ----------------------------------------------------------- Colonoscopy: 09/27/2018 Mammogram: 12/10/2015  Review of Systems  Constitutional: Positive for fatigue.  HENT: Negative.   Eyes: Negative.   Respiratory: Positive for shortness of breath.   Cardiovascular: Positive for chest pain.       Unchanged chest symptoms.  Actually getting better.  Gastrointestinal: Negative.   Endocrine: Negative.   Genitourinary: Negative.   Musculoskeletal: Negative.   Skin: Negative.   Allergic/Immunologic: Positive for environmental allergies.  Neurological: Positive for numbness.  Hematological: Negative.   Psychiatric/Behavioral: Negative.     Social History   Socioeconomic History  . Marital status: Divorced    Spouse name: Not on file  . Number of children: 2  . Years of education: Not on file  . Highest education level: Associate degree: occupational, Hotel manager, or vocational program  Occupational History  . Not on file  Tobacco Use  . Smoking status: Never Smoker  . Smokeless tobacco: Never Used    Substance and Sexual Activity  . Alcohol use: No    Alcohol/week: 0.0 standard drinks  . Drug use: Never  . Sexual activity: Not on file  Other Topics Concern  . Not on file  Social History Narrative  . Not on file   Social Determinants of Health   Financial Resource Strain: Low Risk   . Difficulty of Paying Living Expenses: Not hard at all  Food Insecurity: No Food Insecurity  . Worried About Charity fundraiser in the Last Year: Never true  . Ran Out of Food in the Last Year: Never true  Transportation Needs: No Transportation Needs  . Lack of Transportation (Medical): No  . Lack of Transportation (Non-Medical): No  Physical Activity: Insufficiently Active  . Days of Exercise per Week: 7 days  . Minutes of Exercise per Session: 20 min  Stress: Stress Concern Present  . Feeling of Stress : To some extent  Social Connections: Somewhat Isolated  . Frequency of Communication with Friends and Family: More than three times a week  . Frequency of Social Gatherings with Friends and Family: Once a week  . Attends Religious Services: More than 4 times per year  . Active Member of Clubs or Organizations: No  . Attends Archivist Meetings: Never  . Marital Status: Divorced  Human resources officer Violence: Not At Risk  . Fear of Current or Ex-Partner: No  . Emotionally Abused: No  . Physically Abused: No  . Sexually Abused: No    Past Medical History:  Diagnosis Date  . Complication of anesthesia   . Depression   . Diabetes mellitus without complication ([place])   . Dyspnea    DOE  . Edema    FEET/LEGS  . GERD (gastroesophageal reflux disease)   . Gout   . Headache   . Heart murmur   . History of hiatal hernia   . History of orthopnea   . Hypertension   . Hypothyroidism   . PONV (postoperative nausea and vomiting)   . 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 Sleep apnea    NO CPAP  . Thyroid disease   . Vertigo      Patient Active Problem List   Diagnosis Date Noted   . Oxygen desaturation 06/24/2019  . Encounter for examination following treatment at hospital 06/24/2019  . History of 2019 novel coronavirus disease (COVID-19) 06/24/2019  . Pneumonia due to COVID-19 virus 05/16/2019  . Intractable nausea and vomiting 05/16/2019  . Chronic diastolic CHF (congestive heart failure) ([place]) 05/16/2019  . Encounter for screening colonoscopy   . Benign neoplasm of cecum   . Diverticulosis of large intestine without diverticulitis   . Tortuous colon   . Polyp of sigmoid colon   . Intestinal lump   . Polyneuropathy associated with underlying disease ([place]) 12/11/2016  . Microcalcifications of the breast 12/15/2015  . Back pain, chronic 06/28/2015  . Chronic airway obstruction ([place]) 06/28/2015  . Panlobular emphysema ([place]) 09/10/2014  . Essential hypertension 06/21/2009  . CAD (coronary artery disease) 06/21/2009  . FEVER UNSPECIFIED 06/21/2009  . NAUSEA 06/21/2009  . DYSPHAGIA 06/21/2009  . RUQ PAIN 06/21/2009  . HIATAL HERNIA 01/07/2008  . Hypothyroidism 11/22/2007  . ANXIETY 11/22/2007  . DEPRESSION 11/22/2007  . Hypertensive heart disease without heart failure 11/22/2007  . 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 RENAL CALCULUS 11/22/2007  . Osteoarthritis 11/22/2007  . SLEEP APNEA 11/22/2007  . HEADACHE, CHRONIC 11/22/2007  . GASTRITIS 10/23/2007  . DIVERTICULOSIS, COLON 10/23/2007  . Diabetes mellitus type II, non insulin dependent ([place]) 07/16/2007  . HYPERCHOLESTEROLEMIA 07/16/2007  . OBESITY, MODERATE 07/16/2007  . ISCHEMIC HEART DISEASE 07/16/2007  . ESOPHAGEAL REFLUX 07/16/2007  . SCLERODERMA 07/16/2007  . DYSPNEA 07/16/2007  . COUGH, CHRONIC 07/16/2007    Past Surgical History:  Procedure Laterality Date  . ABDOMINAL HYSTERECTOMY     ovaries intact  . BLADDER SURGERY     bladder tuck  . BREAST BIOPSY Right 03/20/2016   path pending x 2 area  . CARDIAC CATHETERIZATION    . CARPAL TUNNEL RELEASE    . CATARACT EXTRACTION W/PHACO Left  09/17/2018   Procedure: CATARACT EXTRACTION PHACO AND INTRAOCULAR LENS PLACEMENT (IOC) LEFT, DIABETIC;  Surgeon: Birder Robson, MD;  Location: [facility] ORS;  Service: Ophthalmology;  Laterality: Left;  [place] 00:34 CDE 4.85 Fluid pack lot # [id number]  . CATARACT EXTRACTION W/PHACO Right 10/15/2018   Procedure: CATARACT EXTRACTION PHACO AND INTRAOCULAR LENS PLACEMENT (IOC)-RIGHT;  Surgeon: Birder Robson, MD;  Location: [facility] ORS;  Service: Ophthalmology;  Laterality: Right;  [place] 00:27.6 CDE 3.43 Fluid Pack Lot # [id number]  . COLONOSCOPY WITH PROPOFOL N/A 09/27/2018   Procedure: COLONOSCOPY WITH PROPOFOL;  Surgeon: Virgel Manifold, MD;  Location: [facility] ENDOSCOPY;  Service: Endoscopy;  Laterality: N/A;  . DILATION AND CURETTAGE OF UTERUS    . EYE SURGERY     eyelid  . FRACTURE SURGERY     left ankle-plate and screws palced  . LITHOTRIPSY    . NISSEN FUNDOPLICATION    . SHOULDER  SURGERY Right   . TONSILLECTOMY      Her family history includes Arthritis in her mother; Asthma in her sister; Breast cancer in her maternal aunt and maternal grandmother; Colon cancer in her maternal grandmother; Diabetes in her sister; Diverticulitis in her sister; Heart disease in her father and mother; Hypertension in her father, mother, sister, and sister; Stroke in her mother.   Current Outpatient Medications:  .  acetaminophen (TYLENOL) 500 MG tablet, Take 500 mg by mouth every 6 (six) hours as needed for mild pain or headache., Disp: , Rfl:  .  allopurinol (ZYLOPRIM) 100 MG tablet, TAKE 1 TABLET BY MOUTH EVERY DAY, Disp: 90 tablet, Rfl: 3 .  atorvastatin (LIPITOR) 10 MG tablet, TAKE 1 TABLET BY MOUTH EVERY DAY AT BEDTIME (Patient taking differently: Take 10 mg by mouth at bedtime. ), Disp: 90 tablet, Rfl: 3 .  benzonatate (TESSALON) 200 MG capsule, Take 1 capsule (200 mg total) by mouth 3 (three) times daily. (Patient not taking: Reported on 06/24/2019), Disp: , Rfl: 0 .  colchicine 0.6 MG tablet, Take 0.6 mg  by mouth daily as needed (for gout flare-ups). , Disp: , Rfl:  .  Continuous Blood Gluc Sensor (FREESTYLE LIBRE 14 DAY SENSOR) MISC, USE EVERY 14 DAYS AS DIRECTED, Disp: 2 each, Rfl: 12 .  furosemide (LASIX) 40 MG tablet, TAKE 1 TABLET BY MOUTH EVERY DAY, Disp: 90 tablet, Rfl: 3 .  gabapentin (NEURONTIN) 400 MG capsule, Take 1 capsule (400 mg total) by mouth 3 (three) times daily., Disp: 270 capsule, Rfl: 3 .  glimepiride (AMARYL) 4 MG tablet, TAKE 1 TABLET (4 MG TOTAL) BY MOUTH 2 (TWO) TIMES DAILY., Disp: 180 tablet, Rfl: 3 .  glucose blood test strip, Check blood sugar daily. Use as instructed, Disp: 100 each, Rfl: 12 .  guaiFENesin-dextromethorphan (ROBITUSSIN DM) 100-10 MG/5ML syrup, Take 5 mLs by mouth every 4 (four) hours as needed for cough. (Patient not taking: Reported on 09/10/2019), Disp: 118 mL, Rfl: 0 .  JARDIANCE 25 MG TABS tablet, TAKE 25 MG BY MOUTH DAILY., Disp: 90 tablet, Rfl: 3 .  levothyroxine (SYNTHROID, LEVOTHROID) 88 MCG tablet, TAKE 1 TABLET BY MOUTH EVERY DAY (Patient taking differently: Take 88 mcg by mouth daily before breakfast. ), Disp: 90 tablet, Rfl: 3 .  losartan (COZAAR) 25 MG tablet, Take 1 tablet (25 mg total) by mouth daily., Disp: 90 tablet, Rfl: 3 .  menthol-cetylpyridinium (CEPACOL) 3 MG lozenge, Take 1 lozenge (3 mg total) by mouth as needed for sore throat. (Patient not taking: Reported on 09/10/2019), Disp: , Rfl:  .  metFORMIN (GLUCOPHAGE) 1000 MG tablet, TAKE 1 TABLET BY MOUTH TWICE A DAY, Disp: 180 tablet, Rfl: 3 .  nystatin cream (MYCOSTATIN), Apply 1 application topically 2 (two) times daily. (Patient taking differently: Apply 1 application topically 2 (two) times daily as needed (for diabetic yeast reactions.). ), Disp: 30 g, Rfl: 0 .  ondansetron (ZOFRAN) 4 MG tablet, TAKE 1 TABLET BY MOUTH EVERY 8 HOURS AS NEEDED FOR NAUSEA AND VOMITING, Disp: 45 tablet, Rfl: 0 .  ONE TOUCH LANCETS MISC, 1 each by Does not apply route daily., Disp: 30 each, Rfl: 12 .   pioglitazone (ACTOS) 30 MG tablet, TAKE 1 TABLET BY MOUTH EVERY DAY (Patient taking differently: Take 30 mg by mouth daily. ), Disp: 90 tablet, Rfl: 3 .  Semaglutide,0.25 or 0.5MG /DOS, (OZEMPIC, 0.25 OR 0.5 MG/DOSE,) 2 MG/1.5ML SOPN, Inject 0.5 Syringes into the skin once a week., Disp: 1.5 mL, Rfl: 11  Patient Care Team: [name], MD as PCP - General (Family Medicine) Dingeldein, Remo Lipps, MD as Consulting Physician (Ophthalmology) Gardiner Barefoot, DPM as Consulting Physician (Podiatry) Yolonda Kida, MD as Consulting Physician (Cardiology)     Objective:    Vitals: There were no vitals taken for this visit.  Physical Exam Vitals reviewed.  Constitutional:      Appearance: She is obese.  HENT:     Head: Normocephalic and atraumatic.     Right Ear: External ear normal.     Left Ear: External ear normal.  Eyes:     General: No scleral icterus.    Conjunctiva/sclera: Conjunctivae normal.  Cardiovascular:     Rate and Rhythm: Normal rate and regular rhythm.     Heart sounds: Normal heart sounds.  Pulmonary:     Breath sounds: Normal breath sounds.  Abdominal:     Palpations: Abdomen is soft.  Musculoskeletal:     Right lower leg: No edema.     Left lower leg: No edema.  Skin:    General: Skin is warm and dry.  Neurological:     General: No focal deficit present.     Mental Status: She is alert and oriented to person, place, and time.  Psychiatric:        Mood and Affect: Mood normal.        Behavior: Behavior normal.        Thought Content: Thought content normal.        Judgment: Judgment normal.     Activities of Daily Living In your present state of health, do you have any difficulty performing the following activities: 09/10/2019 05/17/2019  Hearing? N N  Vision? N N  Difficulty concentrating or making decisions? N N  Walking or climbing stairs? Y N  Comment Due to SOB. -  Dressing or bathing? N N  Doing errands, shopping? Y N  Comment Currently  not driving. -  Preparing Food and eating ? N -  Using the Toilet? N -  In the past six months, have you accidently leaked urine? N -  Do you have problems with loss of bowel control? N -  Managing your Medications? N -  Managing your Finances? N -  Housekeeping or managing your Housekeeping? N -  Some recent data might be hidden    Fall Risk Assessment Fall Risk  09/10/2019 09/12/2018 05/20/2018 01/19/2017 12/08/2015  Falls in the past year? 1 1 Yes Yes Yes  Number falls in past yr: 1 1 2  or more 2 or more 2 or more  Comment - - - - 6-10  Injury with Fall? 0 0 No No No  Risk Factor Category  - - - - -  Comment - - - - -  Risk for fall due to : Other (Comment) History of fall(s);Impaired balance/gait - - Impaired balance/gait  Risk for fall due to: Comment Due to low heart rate post covid-19. - - - -  Follow up Falls prevention discussed Falls evaluation completed;Education provided - (No Data) -  Comment - Patient has been being followed by neurology - currently being evaluated by Dr Melrose Nakayama -     Depression Screen [facility] 2/9 Scores 09/10/2019 01/14/2019 09/12/2018 01/19/2017  PHQ - 2 Score 0 1 1 0  PHQ- 9 Score - - 5 4    6CIT Screen 01/19/2017  What Year? 0 points  What month? 0 points  What time? 0 points  Count back from 20 0 points  Months in reverse 0 points  Repeat phrase 0 points  Total Score 0       Assessment & Plan:    Annual Physical Reviewed patient's Family Medical History Reviewed and updated list of patient's medical providers Assessment of cognitive impairment was done Assessed patient's functional ability Established a written schedule for health screening [place] Completed and Reviewed  Exercise Activities and Dietary recommendations Goals    . Weight (lb) < 140 lb (63.5 kg)     Recommend to continue to monitor food intake and keep walking daily to help aid with weight loss and control diabetes.        Immunization History    Administered Date(s) Administered  . Fluad Quad(high Dose 65+) 05/27/2019  . Influenza, High Dose Seasonal PF 05/29/2016, 06/06/2017, 05/20/2018  . Pneumococcal Conjugate-13 05/21/2014  . Pneumococcal Polysaccharide-23 11/29/2004, 07/11/2010, 12/08/2015, 05/27/2019  . Td 07/05/1994  . Tdap 05/11/2011  . Zoster 05/07/2008    Health Maintenance  Topic Date Due  . MAMMOGRAM  12/09/2017  . FOOT EXAM  05/21/2019  . HEMOGLOBIN A1C  11/15/2019  . OPHTHALMOLOGY EXAM  07/06/2020  . TETANUS/TDAP  05/10/2021  . COLONOSCOPY  09/28/2023  . INFLUENZA VACCINE  Completed  . DEXA SCAN  Completed  . Hepatitis C Screening  Completed  . PNA vac Low Risk Adult  Completed     Discussed health benefits of physical activity, and encouraged her to engage in regular exercise appropriate for her age and condition.   1. Annual physical exam   2. History of 2019 novel coronavirus disease (COVID-19) Still with significant shortness of breath/dyspnea.  She is not desaturating much at all now. - Hemoglobin A1c - CBC with Differential/Platelet - Comprehensive metabolic panel - TSH - Lipid panel - SAR CoV2 Serology (COVID 19)AB(IGG)IA  3. Essential hypertension Controlled. - Hemoglobin A1c - CBC with Differential/Platelet - Comprehensive metabolic panel - TSH - Lipid panel  4. Acquired hypothyroidism  - Hemoglobin A1c - CBC with Differential/Platelet - Comprehensive metabolic panel - TSH - Lipid panel  5. Diabetes mellitus type II, non insulin dependent ([place]) Consider stopping some of her medication for diabetes if improving.  Last A1c was 8.4. - Hemoglobin A1c - CBC with Differential/Platelet - Comprehensive metabolic panel - TSH - Lipid panel  6. Class 2 severe obesity due to excess calories with serious comorbidity and body mass index (BMI) of 36.0 to 36.9 in adult (HCC)  - Hemoglobin A1c - CBC with Differential/Platelet - Comprehensive metabolic panel - TSH - Lipid  panel  ------------------------------------------------------------------------------------------------------------    Wilhemena Durie, MD  [facility]

## 2019-09-18 ENCOUNTER — Ambulatory Visit (INDEPENDENT_AMBULATORY_CARE_PROVIDER_SITE_OTHER): Payer: PPO | Admitting: Family Medicine

## 2019-09-18 ENCOUNTER — Other Ambulatory Visit: Payer: Self-pay | Admitting: Family Medicine

## 2019-09-18 ENCOUNTER — Other Ambulatory Visit: Payer: Self-pay

## 2019-09-18 ENCOUNTER — Ambulatory Visit: Payer: Self-pay

## 2019-09-18 ENCOUNTER — Encounter: Payer: Self-pay | Admitting: Family Medicine

## 2019-09-18 VITALS — BP 116/71 | HR 59 | Temp 95.9°F | Wt 200.0 lb

## 2019-09-18 DIAGNOSIS — Z8616 Personal history of COVID-19: Secondary | ICD-10-CM

## 2019-09-18 DIAGNOSIS — I10 Essential (primary) hypertension: Secondary | ICD-10-CM

## 2019-09-18 DIAGNOSIS — E039 Hypothyroidism, unspecified: Secondary | ICD-10-CM

## 2019-09-18 DIAGNOSIS — Z6836 Body mass index (BMI) 36.0-36.9, adult: Secondary | ICD-10-CM

## 2019-09-18 DIAGNOSIS — E119 Type 2 diabetes mellitus without complications: Secondary | ICD-10-CM

## 2019-09-18 DIAGNOSIS — Z0000 Encounter for general adult medical examination without abnormal findings: Secondary | ICD-10-CM

## 2019-09-19 LAB — HEMOGLOBIN A1C
Est. average glucose Bld gHb Est-mCnc: 166 mg/dL
Hgb A1c MFr Bld: 7.4 % — ABNORMAL HIGH (ref 4.8–5.6)

## 2019-09-19 LAB — CBC WITH DIFFERENTIAL/PLATELET
Basophils Absolute: 0.1 10*3/uL (ref 0.0–0.2)
Basos: 1 %
EOS (ABSOLUTE): 0.3 10*3/uL (ref 0.0–0.4)
Eos: 4 %
Hematocrit: 45 % (ref 34.0–46.6)
Hemoglobin: 15.3 g/dL (ref 11.1–15.9)
Immature Grans (Abs): 0 10*3/uL (ref 0.0–0.1)
Immature Granulocytes: 0 %
Lymphocytes Absolute: 3.1 10*3/uL (ref 0.7–3.1)
Lymphs: 37 %
MCH: 30.1 pg (ref 26.6–33.0)
MCHC: 34 g/dL (ref 31.5–35.7)
MCV: 88 fL (ref 79–97)
Monocytes Absolute: 0.5 10*3/uL (ref 0.1–0.9)
Monocytes: 6 %
Neutrophils Absolute: 4.5 10*3/uL (ref 1.4–7.0)
Neutrophils: 52 %
Platelets: 239 10*3/uL (ref 150–450)
RBC: 5.09 x10E6/uL (ref 3.77–5.28)
RDW: 13.8 % (ref 11.7–15.4)
WBC: 8.5 10*3/uL (ref 3.4–10.8)

## 2019-09-19 LAB — COMPREHENSIVE METABOLIC PANEL
ALT: 24 IU/L (ref 0–32)
AST: 27 IU/L (ref 0–40)
Albumin/Globulin Ratio: 1.8 (ref 1.2–2.2)
Albumin: 4.6 g/dL (ref 3.7–4.7)
Alkaline Phosphatase: 79 IU/L (ref 39–117)
BUN/Creatinine Ratio: 19 (ref 12–28)
BUN: 13 mg/dL (ref 8–27)
Bilirubin Total: 0.4 mg/dL (ref 0.0–1.2)
CO2: 17 mmol/L — ABNORMAL LOW (ref 20–29)
Calcium: 9.3 mg/dL (ref 8.7–10.3)
Chloride: 106 mmol/L (ref 96–106)
Creatinine, Ser: 0.67 mg/dL (ref 0.57–1.00)
GFR calc Af Amer: 100 mL/min/{1.73_m2} (ref >59)
GFR calc non Af Amer: 87 mL/min/{1.73_m2} (ref >59)
Globulin, Total: 2.6 g/dL (ref 1.5–4.5)
Glucose: 126 mg/dL — ABNORMAL HIGH (ref 65–99)
Potassium: 3.7 mmol/L (ref 3.5–5.2)
Sodium: 144 mmol/L (ref 134–144)
Total Protein: 7.2 g/dL (ref 6.0–8.5)

## 2019-09-19 LAB — LIPID PANEL
Chol/HDL Ratio: 3.4 ratio (ref 0.0–4.4)
Cholesterol, Total: 162 mg/dL (ref 100–199)
HDL: 47 mg/dL (ref >39)
LDL Chol Calc (NIH): 80 mg/dL (ref 0–99)
Triglycerides: 207 mg/dL — ABNORMAL HIGH (ref 0–149)
VLDL Cholesterol Cal: 35 mg/dL (ref 5–40)

## 2019-09-19 LAB — TSH: TSH: 1.31 u[IU]/mL (ref 0.450–4.500)

## 2019-09-19 LAB — SAR COV2 SEROLOGY (COVID19)AB(IGG),IA: DiaSorin SARS-CoV-2 Ab, IgG: POSITIVE — AB

## 2019-09-22 ENCOUNTER — Ambulatory Visit
Admission: RE | Admit: 2019-09-22 | Discharge: 2019-09-22 | Disposition: A | Discharge status: Home / Self Care | Payer: PPO | Source: Ambulatory Visit | Attending: Pulmonary Disease | Admitting: Pulmonary Disease

## 2019-09-22 ENCOUNTER — Other Ambulatory Visit: Payer: Self-pay

## 2019-09-22 DIAGNOSIS — R0600 Dyspnea, unspecified: Secondary | ICD-10-CM

## 2019-09-22 DIAGNOSIS — R0602 Shortness of breath: Secondary | ICD-10-CM | POA: Diagnosis not present

## 2019-09-24 ENCOUNTER — Telehealth: Payer: Self-pay

## 2019-09-24 NOTE — Telephone Encounter (Signed)
Unable to reach pt. Left VM to call back for message from her pcp.

## 2019-09-24 NOTE — Telephone Encounter (Signed)
Left message for a return call back.

## 2019-09-24 NOTE — Telephone Encounter (Signed)
Attempted to call patient with PCP response- left message to call back on VM

## 2019-09-24 NOTE — Telephone Encounter (Signed)
[id number] 

## 2019-09-24 NOTE — Telephone Encounter (Signed)
FMLA done--I estimated April 1st .

## 2019-09-24 NOTE — Telephone Encounter (Signed)
Patient wants to know if Dr. Rosanna Randy faxed her FMLA paperwork yet. She states she brought these forms in the office during her last office visit on 09/18/2019.   She also wants to know how long Dr. Rosanna Randy put her out of work. Please advise.

## 2019-09-25 NOTE — Telephone Encounter (Signed)
Attempted to reach patient with PCP response- left message to call back on voice mail. 3rd attempt- call routed back to office

## 2019-09-25 NOTE — Telephone Encounter (Signed)
Patient called back, received message. Patient would like the paperwork to be faxed to her attorney, Gevena Mart at [phone] and her employer [employer] at [phone], attention [place]. Please advise.

## 2019-09-29 DIAGNOSIS — G4733 Obstructive sleep apnea (adult) (pediatric): Secondary | ICD-10-CM | POA: Diagnosis not present

## 2019-10-09 ENCOUNTER — Other Ambulatory Visit: Payer: Self-pay | Admitting: Family Medicine

## 2019-10-09 DIAGNOSIS — J189 Pneumonia, unspecified organism: Secondary | ICD-10-CM | POA: Diagnosis not present

## 2019-10-09 DIAGNOSIS — J431 Panlobular emphysema: Secondary | ICD-10-CM | POA: Diagnosis not present

## 2019-10-14 DIAGNOSIS — J431 Panlobular emphysema: Secondary | ICD-10-CM | POA: Diagnosis not present

## 2019-10-15 DIAGNOSIS — J431 Panlobular emphysema: Secondary | ICD-10-CM | POA: Diagnosis not present

## 2019-10-20 NOTE — Telephone Encounter (Signed)
Patient requesting this paperwork be faxed to attorney Gevena Mart at [phone].

## 2019-10-21 NOTE — Telephone Encounter (Signed)
Faxed

## 2019-10-23 ENCOUNTER — Other Ambulatory Visit: Payer: Self-pay | Admitting: Family Medicine

## 2019-10-23 MED ORDER — GABAPENTIN 400 MG PO CAPS: 400.0000 mg | ORAL_CAPSULE | Freq: Three times a day (TID) | ORAL | 3 refills | Status: DC

## 2019-10-23 NOTE — Telephone Encounter (Signed)
Noted pt's response to increased pain in hands and feet.  Will forward request to PCP.

## 2019-10-23 NOTE — Telephone Encounter (Signed)
Attempted to call pt. To inquire about reason form request for higher dose of Gabapentin.  Left vm to return call to office to report worsening symptoms.

## 2019-10-23 NOTE — Telephone Encounter (Signed)
Requested medication (s) are due for refill today - no  Requested medication (s) are on the active medication list -yes  Future visit scheduled -yes  Last refill: 04/12/16- old Rx  Notes to clinic: An attempt has been made to contact patient regarding request- message left. Rx sent for PCP review  Requested Prescriptions  Pending Prescriptions Disp Refills   gabapentin (NEURONTIN) 400 MG capsule 270 capsule 3    Sig: Take 1 capsule (400 mg total) by mouth 3 (three) times daily.      Neurology: Anticonvulsants - gabapentin Passed - 10/23/2019  4:04 PM      Passed - Valid encounter within last 12 months    Recent Outpatient Visits           1 month ago Annual physical exam   [facility] [name], MD   3 months ago History of 2019 novel coronavirus disease (COVID-19)   [facility] [name], MD   4 months ago Oxygen desaturation   [facility] Flinchum, Kelby Aline, FNP   9 months ago Type 2 diabetes mellitus without complication, unspecified whether long term insulin use [facility])   [facility] [name], MD   1 year ago Annual physical exam   [facility] [name], MD       Future Appointments             In 2 months [name], MD [facility], [facility]                Requested Prescriptions  Pending Prescriptions Disp Refills   gabapentin (NEURONTIN) 400 MG capsule 270 capsule 3    Sig: Take 1 capsule (400 mg total) by mouth 3 (three) times daily.      Neurology: Anticonvulsants - gabapentin Passed - 10/23/2019  4:04 PM      Passed - Valid encounter within last 12 months    Recent Outpatient Visits           1 month ago Annual physical exam   [facility] [name], MD   3 months ago History of 2019 novel coronavirus disease (COVID-19)   [facility] [name], MD    4 months ago Oxygen desaturation   [facility] Flinchum, Kelby Aline, FNP   9 months ago Type 2 diabetes mellitus without complication, unspecified whether long term insulin use [facility])   [facility] [name], MD   1 year ago Annual physical exam   [facility] [name], MD       Future Appointments             In 2 months [name], MD [facility], [place]

## 2019-10-23 NOTE — Telephone Encounter (Signed)
Pt says that her pain in her hands and feet is bad. Pt says that her pain has increased.

## 2019-10-23 NOTE — Telephone Encounter (Signed)
gabapentin (NEURONTIN) 400 MG capsule    Patient requesting refill. Patient inquired if she can have a higher dose as well.    Pharmacy:  [id number] - [place], [address] Phone:  [phone]  Fax:  [phone]

## 2019-10-27 DIAGNOSIS — G4733 Obstructive sleep apnea (adult) (pediatric): Secondary | ICD-10-CM | POA: Diagnosis not present

## 2019-10-30 ENCOUNTER — Other Ambulatory Visit: Payer: Self-pay

## 2019-10-30 ENCOUNTER — Encounter: Payer: Self-pay | Admitting: Podiatry

## 2019-10-30 ENCOUNTER — Ambulatory Visit: Payer: PPO | Admitting: Podiatry

## 2019-10-30 VITALS — Temp 97.7°F

## 2019-10-30 DIAGNOSIS — M79676 Pain in unspecified toe(s): Secondary | ICD-10-CM | POA: Diagnosis not present

## 2019-10-30 DIAGNOSIS — B351 Tinea unguium: Secondary | ICD-10-CM | POA: Diagnosis not present

## 2019-10-30 DIAGNOSIS — E1142 Type 2 diabetes mellitus with diabetic polyneuropathy: Secondary | ICD-10-CM

## 2019-10-30 NOTE — Progress Notes (Signed)
This patient returns to my office for at risk foot care.  This patient requires this care by a professional since this patient will be at risk due to having  Covid 19 and diabetes.   This patient is unable to cut nails herself since the patient cannot reach her nails.These nails are painful walking and wearing shoes.  This patient presents for at risk foot care today.  General Appearance  Alert, conversant and in no acute stress.  Vascular  Dorsalis pedis and posterior tibial  pulses are palpable  bilaterally.  Capillary return is within normal limits  bilaterally. Temperature is within normal limits  bilaterally.  Neurologic  Senn-Weinstein monofilament wire test within normal limits  bilaterally. Muscle power within normal limits bilaterally.  Nails Thick disfigured discolored nails with subungual debris  from hallux to fifth toes bilaterally. No evidence of bacterial infection or drainage bilaterally.  Orthopedic  No limitations of motion  feet .  No crepitus or effusions noted.  No bony pathology or digital deformities noted.Dorsal lipping first metatarsal  B/L. Hallux limitus 1st MPJ right foot.  Skin  normotropic skin with no porokeratosis noted bilaterally.  No signs of infections or ulcers noted.     Onychomycosis  Pain in right toes  Pain in left toes  Consent was obtained for treatment procedures.   Mechanical debridement of nails 1-5  bilaterally performed with a nail nipper.  Filed with dremel without incident.    Return office visit     3 months                Told patient to return for periodic foot care and evaluation due to potential at risk complications.   Gardiner Barefoot DPM

## 2019-11-03 ENCOUNTER — Telehealth: Payer: Self-pay

## 2019-11-03 NOTE — Telephone Encounter (Signed)
Please advise 

## 2019-11-03 NOTE — Telephone Encounter (Signed)
Copied from [place] [id number]. Topic: General - Inquiry >> Nov 03, 2019  8:50 AM Reyne Dumas L wrote: Reason for CRM:   Pt states that she would like for Dr. Rosanna Randy to extend her FMLA/out of work until June at next [place]. Pt can be reached at [phone]

## 2019-11-05 ENCOUNTER — Telehealth: Payer: Self-pay

## 2019-11-05 DIAGNOSIS — M67912 Unspecified disorder of synovium and tendon, left shoulder: Secondary | ICD-10-CM | POA: Diagnosis not present

## 2019-11-05 DIAGNOSIS — M19011 Primary osteoarthritis, right shoulder: Secondary | ICD-10-CM | POA: Diagnosis not present

## 2019-11-05 NOTE — Telephone Encounter (Signed)
Copied from [place] [id number]. Topic: General - Other >> Nov 05, 2019 11:42 AM Rainey Pines A wrote: Patient would like a callback with status update on on documentation from Dr. Rosanna Randy. Please advise

## 2019-11-06 ENCOUNTER — Telehealth: Payer: Self-pay

## 2019-11-06 NOTE — Telephone Encounter (Signed)
LMOVM for pt to return call 

## 2019-11-06 NOTE — Telephone Encounter (Signed)
Spoke with pt about updated FMLA forms. Pt stated that she would like the forms to be faxed to her attorney Gevena Mart FAX#[phone]. Form has been faed as pt requested. Thanks TNP

## 2019-11-06 NOTE — Telephone Encounter (Signed)
Copied from [place] [id number]. Topic: General - Other >> Nov 06, 2019 12:55 PM Alanda Slim E wrote: Reason for CRM: Pt needs a call about her out of work note that she needs asap / Pt states she needs out of work note until June / please advise

## 2019-11-06 NOTE — Telephone Encounter (Signed)
Patient was advised FMLA form is ready to fax.

## 2019-11-06 NOTE — Telephone Encounter (Signed)
Patient returned your call, I was trying to help look for letter but I dont see one typed in chart. Patient can be reached at [phone]. KW

## 2019-11-10 DIAGNOSIS — J431 Panlobular emphysema: Secondary | ICD-10-CM | POA: Diagnosis not present

## 2019-11-15 ENCOUNTER — Other Ambulatory Visit: Payer: Self-pay | Admitting: Family Medicine

## 2019-11-15 DIAGNOSIS — E119 Type 2 diabetes mellitus without complications: Secondary | ICD-10-CM

## 2019-11-15 DIAGNOSIS — J431 Panlobular emphysema: Secondary | ICD-10-CM | POA: Diagnosis not present

## 2019-11-15 NOTE — Telephone Encounter (Signed)
Requested Prescriptions  Pending Prescriptions Disp Refills  . atorvastatin (LIPITOR) 10 MG tablet [Pharmacy Med Name: ATORVASTATIN 10 MG TABLET] 90 tablet 3    Sig: TAKE 1 TABLET BY MOUTH EVERY DAY AT BEDTIME     Cardiovascular:  Antilipid - Statins Failed - 11/15/2019 11:03 AM      Failed - LDL in normal range and within 360 days    Ldl Cholesterol, Calc  Date Value Ref Range Status  04/04/2013 117 (H) 0 - 100 mg/dL Final   LDL Chol Calc (NIH)  Date Value Ref Range Status  09/18/2019 80 0 - 99 mg/dL Final         Failed - Triglycerides in normal range and within 360 days    Triglycerides  Date Value Ref Range Status  09/18/2019 207 (H) 0 - 149 mg/dL Final  04/04/2013 191 0 - 200 mg/dL Final         Passed - Total Cholesterol in normal range and within 360 days    Cholesterol, Total  Date Value Ref Range Status  09/18/2019 162 100 - 199 mg/dL Final   Cholesterol  Date Value Ref Range Status  04/04/2013 200 0 - 200 mg/dL Final         Passed - HDL in normal range and within 360 days    HDL Cholesterol  Date Value Ref Range Status  04/04/2013 45 40 - 60 mg/dL Final   HDL  Date Value Ref Range Status  09/18/2019 47 >39 mg/dL Final         Passed - Patient is not pregnant      Passed - Valid encounter within last 12 months    Recent Outpatient Visits          1 month ago Annual physical exam   [facility] [name], MD   4 months ago History of 2019 novel coronavirus disease (COVID-19)   [facility] Rosanna Randy, Retia Passe., MD   4 months ago Oxygen desaturation   [facility] Fl[facility], Kelby Aline, FNP   10 months ago Type 2 diabetes mellitus without complication, unspecified whether long term insulin use [facility])   [facility] [name], MD   1 year ago Annual physical exam   [facility] [name], MD      Future Appointments            In 2 months  [name], MD [facility] [place]           . JARDIANCE 25 MG TABS tablet [Pharmacy Med Name: JARDIANCE 25 MG TABLET] 90 tablet 3    Sig: TAKE 25 MG BY MOUTH DAILY.     Endocrinology:  Diabetes - SGLT2 Inhibitors Failed - 11/15/2019 11:03 AM      Failed - LDL in normal range and within 360 days    Ldl Cholesterol, Calc  Date Value Ref Range Status  04/04/2013 117 (H) 0 - 100 mg/dL Final   LDL Chol Calc (NIH)  Date Value Ref Range Status  09/18/2019 80 0 - 99 mg/dL Final         Passed - Cr in normal range and within 360 days    Creatinine  Date Value Ref Range Status  05/14/2014 0.73 0.60 - 1.30 mg/dL Final   Creatinine, Ser  Date Value Ref Range Status  09/18/2019 0.67 0.57 - 1.00 mg/dL Final  Passed - HBA1C is between 0 and 7.9 and within 180 days    Hemoglobin A1C  Date Value Ref Range Status  04/04/2013 7.6 (H) 4.2 - 6.3 % Final    Comment:    The American Diabetes Association recommends that a primary goal of therapy should be <7% and that physicians should reevaluate the treatment regimen in patients with HbA1c values consistently >8%.    Hgb A1c MFr Bld  Date Value Ref Range Status  09/18/2019 7.4 (H) 4.8 - 5.6 % Final    Comment:             Prediabetes: 5.7 - 6.4          Diabetes: >6.4          Glycemic control for adults with diabetes: <7.0          Passed - eGFR in normal range and within 360 days    EGFR (African American)  Date Value Ref Range Status  05/14/2014 >60 >50m/min Final  04/04/2013 >60  Final   GFR calc Af Amer  Date Value Ref Range Status  09/18/2019 100 >59 mL/min/1.73 Final   EGFR (Non-African Amer.)  Date Value Ref Range Status  05/14/2014 >60 >648mmin Final    Comment:    eGFR values <6042min/1.73 m2 may be an indication of chronic kidney disease (CKD). Calculated eGFR, using the MRDR Study equation, is useful in  patients with stable renal function. The eGFR calculation will not be  reliable in acutely ill patients when serum creatinine is changing rapidly. It is not useful in patients on dialysis. The eGFR calculation may not be applicable to patients at the low and high extremes of body sizes, pregnant women, and vegetarians.   04/04/2013 >60  Final    Comment:    eGFR values <61m73mn/1.73 m2 may be an indication of chronic kidney disease (CKD). Calculated eGFR is useful in patients with stable renal function. The eGFR calculation will not be reliable in acutely ill patients when serum creatinine is changing rapidly. It is not useful in  patients on dialysis. The eGFR calculation may not be applicable to patients at the low and high extremes of body sizes, pregnant women, and vegetarians.    GFR calc non Af Amer  Date Value Ref Range Status  09/18/2019 87 >59 mL/min/1.73 Final         Passed - Valid encounter within last 6 months    Recent Outpatient Visits          1 month ago [facility]   4 months ago History of 2019 novel coronavirus disease (COVID-19)   [facility]   4 months [facility] [place] [facility]   10 months ago Type 2 diabetes mellitus without complication, unspecified whether long term insulin use [facility]   1 year ago [facility] physical [facility]      Future Appointments            In 2 months [facility]

## 2019-11-18 ENCOUNTER — Telehealth: Payer: Self-pay

## 2019-11-18 NOTE — Telephone Encounter (Signed)
Copied from [place] [id number]. Topic: General - Other >> Nov 18, 2019 10:31 AM Yvette Rack wrote: Reason for CRM: Hassan Rowan with [employer] called for an update on the requests for medical records and billing. Hassan Rowan stated the request for medical records was faxed on 10/31/19 and the request for billing was faxed on 11/03/19. Hassan Rowan requests call back. Cb# [phone]

## 2019-11-18 NOTE — Telephone Encounter (Signed)
Please advise 

## 2019-11-27 DIAGNOSIS — G4733 Obstructive sleep apnea (adult) (pediatric): Secondary | ICD-10-CM | POA: Diagnosis not present

## 2019-11-27 NOTE — Telephone Encounter (Signed)
Received billing request on 11/25/2019 and forward request to billing office via e-mail on 11/25/2019. Thanks TNP

## 2019-11-28 ENCOUNTER — Telehealth: Payer: Self-pay | Admitting: Family Medicine

## 2019-11-28 NOTE — Chronic Care Management (AMB) (Signed)
  Chronic Care Management   Note  11/28/2019 Name: Teresa [name] MRN: [id number] DOB: 01/26/1946  Teresa [name] is a 74 y.o. year old female who is a primary care patient of [name], MD. I reached out to Loraine Maple by phone today in response to a referral sent by Teresa [name] health plan.     Teresa [name] was given information about Chronic Care Management services today including:  1. CCM service includes personalized support from designated clinical staff supervised by her physician, including individualized plan of care and coordination with other care providers 2. 24/7 contact phone numbers for assistance for urgent and routine care needs. 3. Service will only be billed when office clinical staff spend 20 minutes or more in a month to coordinate care. 4. Only one practitioner may furnish and bill the service in a calendar month. 5. The patient may stop CCM services at any time (effective at the end of the month) by phone call to the office staff. 6. The patient will be responsible for cost sharing (co-pay) of up to 20% of the service fee (after annual deductible is met).  Patient agreed to services and verbal consent obtained.   Follow up plan: Telephone appointment with care management team member scheduled for:12/05/2019  Noreene Larsson, [place], [place], [place] [address] Direct Dial: [phone] [email or web address] Website: [email or web address]

## 2019-12-04 ENCOUNTER — Other Ambulatory Visit: Payer: Self-pay

## 2019-12-04 DIAGNOSIS — E119 Type 2 diabetes mellitus without complications: Secondary | ICD-10-CM

## 2019-12-04 NOTE — Progress Notes (Signed)
Referral placed to CCM Pharmacist.

## 2019-12-05 ENCOUNTER — Ambulatory Visit: Payer: PPO | Admitting: Pharmacist

## 2019-12-05 ENCOUNTER — Telehealth: Payer: PPO

## 2019-12-05 ENCOUNTER — Other Ambulatory Visit: Payer: Self-pay

## 2019-12-05 DIAGNOSIS — I10 Essential (primary) hypertension: Secondary | ICD-10-CM

## 2019-12-05 DIAGNOSIS — E039 Hypothyroidism, unspecified: Secondary | ICD-10-CM

## 2019-12-05 NOTE — Chronic Care Management (AMB) (Deleted)
Chronic Care Management Pharmacy  Name: Teresa [name]  MRN: [id number] DOB: Apr 18, 1946  Chief Complaint/ HPI  Teresa [name],  74 y.o. , female presents for their Initial CCM visit with the clinical pharmacist via telephone due to COVID-19 Pandemic.  PCP : [name], MD  Their chronic conditions include: CAD, depression, DM, GERD, Gout, HTN, Pulm HTN, hypothyroid  Office Visits: 2/1 Annual, [place], BP 116/71 P 59  Consult Visit: 3/4 Aleskerov, Pneumonitis, post Covid-19, Trelegy starter  Medications: Outpatient Encounter Medications as of 12/05/2019  Medication Sig Note  . acetaminophen (TYLENOL) 500 MG tablet Take 500 mg by mouth every 6 (six) hours as needed for mild pain or headache.   . allopurinol (ZYLOPRIM) 100 MG tablet TAKE 1 TABLET BY MOUTH EVERY DAY 05/17/2019: Takes at bedtime  . atorvastatin (LIPITOR) 10 MG tablet TAKE 1 TABLET BY MOUTH EVERY DAY AT BEDTIME   . benzonatate (TESSALON) 200 MG capsule Take 1 capsule (200 mg total) by mouth 3 (three) times daily.   . colchicine 0.6 MG tablet Take 0.6 mg by mouth daily as needed (for gout flare-ups).    . Continuous Blood Gluc Sensor (FREESTYLE LIBRE 14 DAY SENSOR) MISC USE EVERY 14 DAYS AS DIRECTED   . Fluticasone-Umeclidin-Vilant (TRELEGY ELLIPTA) 100-62.5-25 MCG/INH AEPB Inhale into the lungs.   . furosemide (LASIX) 40 MG tablet TAKE 1 TABLET BY MOUTH EVERY DAY   . gabapentin (NEURONTIN) 400 MG capsule Take 1 capsule (400 mg total) by mouth 3 (three) times daily.   Marland Kitchen glimepiride (AMARYL) 4 MG tablet TAKE 1 TABLET (4 MG TOTAL) BY MOUTH 2 (TWO) TIMES DAILY.   Marland Kitchen glucose blood test strip Check blood sugar daily. Use as instructed   . guaiFENesin-dextromethorphan (ROBITUSSIN DM) 100-10 MG/5ML syrup Take 5 mLs by mouth every 4 (four) hours as needed for cough.   Marland Kitchen JARDIANCE 25 MG TABS tablet TAKE 25 MG BY MOUTH DAILY.   Marland Kitchen levothyroxine (SYNTHROID, LEVOTHROID) 88 MCG tablet TAKE 1 TABLET BY MOUTH EVERY DAY  (Patient taking differently: Take 88 mcg by mouth daily before breakfast. )   . losartan (COZAAR) 25 MG tablet Take 1 tablet (25 mg total) by mouth daily.   Marland Kitchen menthol-cetylpyridinium (CEPACOL) 3 MG lozenge Take 1 lozenge (3 mg total) by mouth as needed for sore throat.   . metFORMIN (GLUCOPHAGE) 1000 MG tablet TAKE 1 TABLET BY MOUTH TWICE A DAY   . nystatin cream (MYCOSTATIN) Apply 1 application topically 2 (two) times daily. (Patient taking differently: Apply 1 application topically 2 (two) times daily as needed (for diabetic yeast reactions.). )   . ondansetron (ZOFRAN) 4 MG tablet TAKE 1 TABLET BY MOUTH EVERY 8 HOURS AS NEEDED FOR NAUSEA AND VOMITING   . ONE TOUCH LANCETS MISC 1 each by Does not apply route daily.   Marland Kitchen OZEMPIC, 0.25 OR 0.5 MG/DOSE, 2 MG/1.5ML SOPN INJECT 0.5MG  INTO THE SKIN ONCE A WEEK.   . pioglitazone (ACTOS) 30 MG tablet TAKE 1 TABLET BY MOUTH EVERY DAY (Patient taking differently: Take 30 mg by mouth daily. )    No facility-administered encounter medications on file as of 12/05/2019.     Current Diagnosis/Assessment:  Goals Addressed   None    Hypothyroidism   TSH  Date Value Ref Range Status  09/18/2019 1.310 0.450 - 4.500 uIU/mL Final     Patient has failed these meds in past: *** Patient is currently {CHL Controlled/Uncontrolled:[phone]} on the following medications: ***  We discussed:  {CHL  HP Upstream Pharmacy discussion:[phone]}  Plan  Continue {CHL HP Upstream Pharmacy Plans:[phone]}   Diabetes Mellitus   Patient has failed these meds in past: *** Patient is currently {CHL Controlled/Uncontrolled:[phone]} on the following medications: ***  A1c 2/21 7.4% 11/20 8.4% 2/20 7.5%  We discussed:  ***  Plan  Continue  Hypertension   Patient has failed these meds in past: *** Patient is currently uncontrolled on the following medications: *** CMP: wnl  We discussed:  ***  Plan  Continue

## 2019-12-05 NOTE — Chronic Care Management (AMB) (Signed)
Chronic Care Management Pharmacy  Name: Teresa [name]  MRN: [id number] DOB: 1946-01-20  Chief Complaint/ HPI  Teresa [name],  74 y.o. , female presents for their Initial CCM visit with the clinical pharmacist via telephone due to COVID-19 Pandemic.  PCP : Teresa [name]., MD  Their chronic conditions include: CAD, depression, DM, GERD, Gout, HTN, Pulm HTN, hypothyroid  Office Visits: 2/1 Annual, [place], BP 116/71 P 59  Consult Visit: 3/4 Aleskerov, Pneumonitis, post Covid-19, Trelegy starter  Medications: Outpatient Encounter Medications as of 12/05/2019  Medication Sig Note  . acetaminophen (TYLENOL) 500 MG tablet Take 500 mg by mouth every 6 (six) hours as needed for mild pain or headache.   . allopurinol (ZYLOPRIM) 100 MG tablet TAKE 1 TABLET BY MOUTH EVERY DAY 05/17/2019: Takes at bedtime  . atorvastatin (LIPITOR) 10 MG tablet TAKE 1 TABLET BY MOUTH EVERY DAY AT BEDTIME   . benzonatate (TESSALON) 200 MG capsule Take 1 capsule (200 mg total) by mouth 3 (three) times daily.   . colchicine 0.6 MG tablet Take 0.6 mg by mouth daily as needed (for gout flare-ups).    . Continuous Blood Gluc Sensor (FREESTYLE LIBRE 14 DAY SENSOR) MISC USE EVERY 14 DAYS AS DIRECTED   . Fluticasone-Umeclidin-Vilant (TRELEGY ELLIPTA) 100-62.5-25 MCG/INH AEPB Inhale into the lungs.   . furosemide (LASIX) 40 MG tablet TAKE 1 TABLET BY MOUTH EVERY DAY   . gabapentin (NEURONTIN) 400 MG capsule Take 1 capsule (400 mg total) by mouth 3 (three) times daily.   Marland Kitchen glimepiride (AMARYL) 4 MG tablet TAKE 1 TABLET (4 MG TOTAL) BY MOUTH 2 (TWO) TIMES DAILY.   Marland Kitchen glucose blood test strip Check blood sugar daily. Use as instructed   . guaiFENesin-dextromethorphan (ROBITUSSIN DM) 100-10 MG/5ML syrup Take 5 mLs by mouth every 4 (four) hours as needed for cough.   Marland Kitchen JARDIANCE 25 MG TABS tablet TAKE 25 MG BY MOUTH DAILY.   Marland Kitchen levothyroxine (SYNTHROID, LEVOTHROID) 88 MCG tablet TAKE 1 TABLET BY MOUTH EVERY DAY  (Patient taking differently: Take 88 mcg by mouth daily before breakfast. )   . losartan (COZAAR) 25 MG tablet Take 1 tablet (25 mg total) by mouth daily.   Marland Kitchen menthol-cetylpyridinium (CEPACOL) 3 MG lozenge Take 1 lozenge (3 mg total) by mouth as needed for sore throat.   . metFORMIN (GLUCOPHAGE) 1000 MG tablet TAKE 1 TABLET BY MOUTH TWICE A DAY   . nystatin cream (MYCOSTATIN) Apply 1 application topically 2 (two) times daily. (Patient taking differently: Apply 1 application topically 2 (two) times daily as needed (for diabetic yeast reactions.). )   . ondansetron (ZOFRAN) 4 MG tablet TAKE 1 TABLET BY MOUTH EVERY 8 HOURS AS NEEDED FOR NAUSEA AND VOMITING   . ONE TOUCH LANCETS MISC 1 each by Does not apply route daily.   Marland Kitchen OZEMPIC, 0.25 OR 0.5 MG/DOSE, 2 MG/1.5ML SOPN INJECT 0.5MG  INTO THE SKIN ONCE A WEEK.   . pioglitazone (ACTOS) 30 MG tablet TAKE 1 TABLET BY MOUTH EVERY DAY (Patient taking differently: Take 30 mg by mouth daily. )    No facility-administered encounter medications on file as of 12/05/2019.     Financial Resource Strain: High Risk  . Difficulty of Paying Living Expenses: Very hard    Current Diagnosis/Assessment:  Goals Addressed   None    Hypothyroidism   TSH  Date Value Ref Range Status  09/18/2019 1.310 0.450 - 4.500 uIU/mL Final     Patient has failed these meds in  past: None Patient is currently controlled on the following medications: Synthroid  We discussed:  Role of thyroid function in weight loss and mood.  Plan  Continue current medications   Diabetes Mellitus   Patient has failed these meds in past: NA Patient is currently uncontrolled on the following medications: Ozempic, Jardiance, metformin  A1c 2/21 7.4% 11/20 8.4% 2/20 7.5%  We discussed:   Jardiance x 2 years. Not helping with weight loss.  Ozempic about 1 year Gabapentin for neuropathy, Neuropathic pain worse at night.,since Covid-19 worse Can predict lows. Every 2-3 weeks. Worse  with Covid-19. Hospitalized for 2 months. Pain: 7 now 5-7 typical 10+ at night  Plan Increase gabapentin 400mg  twice daily 800mg  at bedtime (PCP agreed)   Hypertension   Patient has failed these meds in past: NA Patient is currently controlled on the following medications: losartan, (Jardiance)  We discussed:  BP was low during Covid-19, At home 120/76.  Plan  Continue current regimen.  Gout   Patient has failed these meds in past: NA Patient is currently controlled on the following medications: allopurinol, colchicine prn  We discussed:  If she d/c allopurinol she has an attack  Plan  Continue current regimen  COPD   Patient has failed these meds in past: Trelegy (cost) Patient is currently controlled on the following medications: Pulmicort nebs  We discussed:  Budesonide nebs 2 times daily  Plan  Continue current medications  Milus Height, PharmD, BCGP, [place] [phone]

## 2019-12-07 NOTE — Patient Instructions (Addendum)
Visit Information  Goals Addressed            This Visit's Progress   . Diabetes Mellitus - goal A1c < 7% (pt-stated)       CARE PLAN ENTRY (see longitudinal plan of care for additional care plan information)  Current Barriers:  . Diabetes: Type 2; complicated by chronic medical conditions including HF, pulmonary HTN, hypothyroidism Lab Results  Component Value Date   HGBA1C 7.4 (H) 09/18/2019 .   Lab Results  Component Value Date   CREATININE 0.67 09/18/2019   CREATININE 0.78 06/24/2019   CREATININE 0.56 05/27/2019 .   Marland Kitchen No results found for: EGFR . Current antihyperglycemic regimen: Ozempic, Jardiance, metformin . Denies hypoglycemic symptoms . Denies hyperglycemic symptoms . Current exercise: unable to walk due to neuropathy . 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 Comprehensive medication review performed, medication list updated in electronic medical record . Inter-disciplinary care team collaboration (see longitudinal plan of care) . Increased gabapentin  Patient Self Care Activities:  . Patient will check blood glucose daily , document, and provide at future appointments . Patient will take medications as prescribed . Patient will contact provider with any episodes of hypoglycemia . Patient will increase exercise over next 90 days after neuropathic pain reduced  Initial goal documentation     . Hypertension - Goal BP < 140/90 (pt-stated)       CARE PLAN ENTRY (see longitudinal plan of care for additional care plan information)  Current Barriers:  . Uncontrolled hypertension, complicated by Diabetes, pain . Current antihypertensive regimen: losartan, (Jardiance) . Previous antihypertensives tried: NA . Last practice recorded BP readings:  BP Readings from Last 3 Encounters:  09/18/19 116/71  07/15/19  104/64  05/27/19 126/66 .   Marland Kitchen Current home BP readings: 120/76 . Most recent eGFR/CrCl: No results found for: EGFR  No components found for: CRCL  Pharmacist Clinical Goal(s):  Marland Kitchen Over the next 90 days, patient will work with PharmD and providers to continue antihypertensive regimen  Interventions: . Inter-disciplinary care team collaboration (see longitudinal plan of care) . Comprehensive medication review performed; medication list updated in the electronic medical record.  .  Patient Self Care Activities:  . Patient will continue to check BP daily , document, and provide at future appointments . Patient will focus on medication adherence by continuing current regimen  Initial goal documentation        Teresa [name] was given information about Chronic Care Management services today including:  1. CCM service includes personalized support from designated clinical staff supervised by her physician, including individualized plan of care and coordination with other care providers 2. 24/7 contact phone numbers for assistance for urgent and routine care needs. 3. Standard insurance, coinsurance, copays and deductibles apply for chronic care management only during months in which we provide at least 20 minutes of these services. Most insurances cover these services at 100%, however patients may be responsible for any copay, coinsurance and/or deductible if applicable. This service may help you avoid the need for more expensive face-to-face services. 4. Only one practitioner may furnish and bill the service in a calendar month. 5. The patient may stop CCM services at any time (effective at the end of the month) by phone call to the office staff.  Patient agreed to services and verbal consent obtained.   Print copy of  patient instructions provided.  Telephone follow up appointment with pharmacy team member scheduled for:  Milus Height, PharmD, BCGP, CTTS Clinical Pharmacist [facility] [phone]  Chronic Obstructive Pulmonary Disease Chronic obstructive pulmonary disease (COPD) is a long-term (chronic) lung problem. When you have COPD, it is hard for air to get in and out of your lungs. Usually the condition gets worse over time, and your lungs will never return to normal. There are things you can do to keep yourself as healthy as possible.  Your doctor may treat your condition with: ? Medicines. ? Oxygen. ? Lung surgery.  Your doctor may also recommend: ? Rehabilitation. This includes steps to make your body work better. It may involve a team of specialists. ? Quitting smoking, if you smoke. ? Exercise and changes to your diet. ? Comfort measures (palliative care). Follow these instructions at home: Medicines  Take over-the-counter and prescription medicines only as told by your doctor.  Talk to your doctor before taking any cough or allergy medicines. You may need to avoid medicines that cause your lungs to be dry. Lifestyle  If you smoke, stop. Smoking makes the problem worse. If you need help quitting, ask your doctor.  Avoid being around things that make your breathing worse. This may include smoke, chemicals, and fumes.  Stay active, but remember to rest as well.  Learn and use tips on how to relax.  Make sure you get enough sleep. Most adults need at least 7 hours of sleep every night.  Eat healthy foods. Eat smaller meals more often. Rest before meals. Controlled breathing Learn and use tips on how to control your breathing as told by your doctor. Try:  Breathing in (inhaling) through your nose for 1 second. Then, pucker your lips and breath out (exhale) through your lips for 2 seconds.  Putting one hand on your belly (abdomen). Breathe in slowly through your nose for 1 second. Your hand on your belly should move out. Pucker your lips and breathe out slowly through your lips. Your hand on your belly should move in as you breathe  out.  Controlled coughing Learn and use controlled coughing to clear mucus from your lungs. Follow these steps: 1. Lean your head a little forward. 2. Breathe in deeply. 3. Try to hold your breath for 3 seconds. 4. Keep your mouth slightly open while coughing 2 times. 5. Spit any mucus out into a tissue. 6. Rest and do the steps again 1 or 2 times as needed. General instructions  Make sure you get all the shots (vaccines) that your doctor recommends. Ask your doctor about a flu shot and a pneumonia shot.  Use oxygen therapy and pulmonary rehabilitation if told by your doctor. If you need home oxygen therapy, ask your doctor if you should buy a tool to measure your oxygen level (oximeter).  Make a COPD action plan with your doctor. This helps you to know what to do if you feel worse than usual.  Manage any other conditions you have as told by your doctor.  Avoid going outside when it is very hot, cold, or humid.  Avoid people who have a sickness you can catch (contagious).  Keep all follow-up visits as told by your doctor. This is important. Contact a doctor if:  You cough up more mucus than usual.  There is a change in the color or thickness of the mucus.  It is harder to breathe than usual.  Your breathing is faster than usual.  You have trouble sleeping.  You need to use your medicines more often than usual.  You have trouble doing your normal activities such as getting dressed or walking around the house. Get help right away if:  You have shortness of breath while resting.  You have shortness of breath that stops you from: ? Being able to talk. ? Doing normal activities.  Your chest hurts for longer than 5 minutes.  Your skin color is more blue than usual.  Your pulse oximeter shows that you have low oxygen for longer than 5 minutes.  You have a fever.  You feel too tired to breathe normally. Summary  Chronic obstructive pulmonary disease (COPD) is a  long-term lung problem.  The way your lungs work will never return to normal. Usually the condition gets worse over time. There are things you can do to keep yourself as healthy as possible.  Take over-the-counter and prescription medicines only as told by your doctor.  If you smoke, stop. Smoking makes the problem worse. This information is not intended to replace advice given to you by your health care provider. Make sure you discuss any questions you have with your health care provider. Document Revised: 07/06/2017 Document Reviewed: 08/28/2016 Elsevier Patient Education  2020 [employer].

## 2019-12-12 ENCOUNTER — Other Ambulatory Visit: Payer: Self-pay | Admitting: Family Medicine

## 2019-12-12 DIAGNOSIS — M25511 Pain in right shoulder: Secondary | ICD-10-CM | POA: Diagnosis not present

## 2019-12-12 DIAGNOSIS — M25512 Pain in left shoulder: Secondary | ICD-10-CM | POA: Diagnosis not present

## 2019-12-15 DIAGNOSIS — J431 Panlobular emphysema: Secondary | ICD-10-CM | POA: Diagnosis not present

## 2019-12-24 DIAGNOSIS — J431 Panlobular emphysema: Secondary | ICD-10-CM | POA: Diagnosis not present

## 2019-12-30 ENCOUNTER — Telehealth: Payer: Self-pay

## 2019-12-30 NOTE — Telephone Encounter (Signed)
Please advise? Is this okay to order?

## 2019-12-30 NOTE — Telephone Encounter (Signed)
Copied from [place] [id number]. Topic: General - Inquiry >> Dec 30, 2019  2:16 PM Scherrie Gerlach wrote: Reason for CRM: pt states [employer] just picked up her CPAP and she would like the dr to order one from [facility], with heated tubing and full face mask. She got fed up with [employer] Pt needs one asap, because she has nothing right now.

## 2019-12-31 NOTE — Telephone Encounter (Signed)
Yes

## 2020-01-01 ENCOUNTER — Other Ambulatory Visit: Payer: Self-pay

## 2020-01-01 DIAGNOSIS — J431 Panlobular emphysema: Secondary | ICD-10-CM | POA: Diagnosis not present

## 2020-01-01 DIAGNOSIS — G4733 Obstructive sleep apnea (adult) (pediatric): Secondary | ICD-10-CM

## 2020-01-01 NOTE — Telephone Encounter (Signed)
Order was started but epic is down.

## 2020-01-06 ENCOUNTER — Other Ambulatory Visit: Payer: Self-pay

## 2020-01-06 NOTE — Telephone Encounter (Signed)
Cpap order has been placed.

## 2020-01-15 DIAGNOSIS — J431 Panlobular emphysema: Secondary | ICD-10-CM | POA: Diagnosis not present

## 2020-01-15 NOTE — Progress Notes (Signed)
Established patient visit   Patient: Teresa [name]   DOB: 12-11-1945   74 y.o. Female  MRN: [id number] Visit Date: 01/19/2020  Today's healthcare provider: Wilhemena Durie, MD   Chief Complaint  Patient presents with  . Hypertension  . Diabetes Mellitus   Subjective    HPI  Patient is slowly recovering from Covid and has had to retire due to chronic fatigue and dyspnea from this.  She wanted to go back to work but is unable to do so. This past weekend she had vomiting and diarrhea which has resolved and she is almost back to normal.  She continues to have loose stools and some cramping.  She has had no fevers and no abdominal pain. Diabetes Mellitus Type II, follow-up  Lab Results  Component Value Date   HGBA1C 7.4 (H) 09/18/2019   HGBA1C 8.4 (H) 05/17/2019   HGBA1C 7.5 (A) 01/14/2019   Last seen for diabetes 4 months ago.  Management since then includes; Consider stopping some of her medication for diabetes if improving.  A1c was 7.4. She reports excellent compliance with treatment. She is not having side effects.   Home blood sugar records: fasting range: 96  Episodes of hypoglycemia? No    Current insulin regiment: Ozempic Most Recent Eye Exam: due in November  --------------------------------------------------------------------------------------------------- Hypertension, follow-up  BP Readings from Last 3 Encounters:  01/19/20 100/68  09/18/19 116/71  07/15/19 104/64   Wt Readings from Last 3 Encounters:  01/19/20 202 lb 12.8 oz (92 kg)  09/18/19 200 lb (90.7 kg)  07/15/19 194 lb (88 kg)     She was last seen for hypertension 4 months ago.  BP at that visit was 116/71. Management since that visit includes; controlled. She reports excellent compliance with treatment. She is not having side effects.  She is exercising. She is adherent to low salt diet.   Outside blood pressures are being checked twice a day.  She does not smoke.  Use of  agents associated with hypertension: NSAIDS.   ---------------------------------------------------------------------------------------------------  Social History   Tobacco Use  . Smoking status: Never Smoker  . Smokeless tobacco: Never Used  Vaping Use  . Vaping Use: Never used  Substance Use Topics  . Alcohol use: No    Alcohol/week: 0.0 standard drinks  . Drug use: Never       Medications: Outpatient Medications Prior to Visit  Medication Sig  . acetaminophen (TYLENOL) 500 MG tablet Take 500 mg by mouth every 6 (six) hours as needed for mild pain or headache.  . allopurinol (ZYLOPRIM) 100 MG tablet TAKE 1 TABLET BY MOUTH EVERY DAY  . atorvastatin (LIPITOR) 10 MG tablet TAKE 1 TABLET BY MOUTH EVERY DAY AT BEDTIME  . budesonide (PULMICORT) 0.5 MG/2ML nebulizer solution Inhale into the lungs.  . colchicine 0.6 MG tablet Take 0.6 mg by mouth daily as needed (for gout flare-ups).   . Continuous Blood Gluc Sensor (FREESTYLE LIBRE 14 DAY SENSOR) MISC USE EVERY 14 DAYS AS DIRECTED  . furosemide (LASIX) 40 MG tablet TAKE 1 TABLET BY MOUTH EVERY DAY  . gabapentin (NEURONTIN) 400 MG capsule Take 1 capsule (400 mg total) by mouth 3 (three) times daily.  Marland Kitchen glimepiride (AMARYL) 4 MG tablet TAKE 1 TABLET (4 MG TOTAL) BY MOUTH 2 (TWO) TIMES DAILY.  Marland Kitchen JARDIANCE 25 MG TABS tablet TAKE 25 MG BY MOUTH DAILY.  Marland Kitchen levothyroxine (SYNTHROID, LEVOTHROID) 88 MCG tablet TAKE 1 TABLET BY MOUTH EVERY DAY (Patient taking differently:  Take 88 mcg by mouth daily before breakfast. )  . losartan (COZAAR) 25 MG tablet Take 1 tablet (25 mg total) by mouth daily.  . metFORMIN (GLUCOPHAGE) 1000 MG tablet TAKE 1 TABLET BY MOUTH TWICE A DAY  . nystatin cream (MYCOSTATIN) Apply 1 application topically 2 (two) times daily. (Patient taking differently: Apply 1 application topically 2 (two) times daily as needed (for diabetic yeast reactions.). )  . ondansetron (ZOFRAN) 4 MG tablet TAKE 1 TABLET BY MOUTH EVERY 8 HOURS AS  NEEDED FOR NAUSEA AND VOMITING  . OZEMPIC, 0.25 OR 0.5 MG/DOSE, 2 MG/1.5ML SOPN INJECT 0.5MG  INTO THE SKIN ONCE A WEEK.  . pioglitazone (ACTOS) 30 MG tablet TAKE 1 TABLET BY MOUTH EVERY DAY (Patient taking differently: Take 30 mg by mouth daily. )  . benzonatate (TESSALON) 200 MG capsule Take 1 capsule (200 mg total) by mouth 3 (three) times daily. (Patient not taking: Reported on 12/05/2019)  . guaiFENesin-dextromethorphan (ROBITUSSIN DM) 100-10 MG/5ML syrup Take 5 mLs by mouth every 4 (four) hours as needed for cough. (Patient not taking: Reported on 12/05/2019)  . menthol-cetylpyridinium (CEPACOL) 3 MG lozenge Take 1 lozenge (3 mg total) by mouth as needed for sore throat. (Patient not taking: Reported on 12/05/2019)  . [DISCONTINUED] Fluticasone-Umeclidin-Vilant (TRELEGY ELLIPTA) 100-62.5-25 MCG/INH AEPB Inhale into the lungs. (Patient not taking: Reported on 01/19/2020)  . [DISCONTINUED] glucose blood test strip Check blood sugar daily. Use as instructed (Patient not taking: Reported on 01/19/2020)  . [DISCONTINUED] ONE TOUCH LANCETS MISC 1 each by Does not apply route daily.   No facility-administered medications prior to visit.    Review of Systems  Constitutional: Negative for appetite change, chills, fatigue and fever.  HENT: Negative.   Eyes: Negative.   Respiratory: Negative for chest tightness and shortness of breath.        DOE.  Cardiovascular: Negative for chest pain and palpitations.  Gastrointestinal: Positive for diarrhea. Negative for abdominal pain, nausea and vomiting.  Endocrine: Negative.   Genitourinary: Negative.   Musculoskeletal: Negative.   Allergic/Immunologic: Negative.   Neurological: Negative for dizziness and weakness.  Hematological: Negative.   Psychiatric/Behavioral: Negative.     Last hemoglobin A1c Lab Results  Component Value Date   HGBA1C 7.4 (A) 01/19/2020      Objective    BP 100/68 (BP Location: Right Arm, Patient Position: Sitting, Cuff  Size: Large)   Pulse 69   Temp (!) 97.1 F (36.2 C) (Temporal)   Ht 5\' 2"  (1.575 m)   Wt 202 lb 12.8 oz (92 kg)   SpO2 96%   BMI 37.09 kg/m  BP Readings from Last 3 Encounters:  01/19/20 100/68  09/18/19 116/71  07/15/19 104/64   Wt Readings from Last 3 Encounters:  01/19/20 202 lb 12.8 oz (92 kg)  09/18/19 200 lb (90.7 kg)  07/15/19 194 lb (88 kg)      Physical Exam Vitals reviewed.  Constitutional:      Appearance: She is obese.  HENT:     Head: Normocephalic and atraumatic.     Right Ear: External ear normal.     Left Ear: External ear normal.  Eyes:     General: No scleral icterus.    Conjunctiva/sclera: Conjunctivae normal.  Cardiovascular:     Rate and Rhythm: Normal rate and regular rhythm.     Heart sounds: Normal heart sounds.  Pulmonary:     Breath sounds: Normal breath sounds.  Abdominal:     General: Bowel sounds are normal. There  is no distension.     Palpations: Abdomen is soft.     Tenderness: There is no abdominal tenderness. There is no guarding.  Musculoskeletal:     Right lower leg: No edema.     Left lower leg: No edema.  Skin:    General: Skin is warm and dry.  Neurological:     General: No focal deficit present.     Mental Status: She is alert and oriented to person, place, and time.  Psychiatric:        Mood and Affect: Mood normal.        Behavior: Behavior normal.        Thought Content: Thought content normal.        Judgment: Judgment normal.       No results found for any visits on 01/19/20.  Assessment & Plan     1. Type 2 diabetes mellitus with diabetic neuropathy, without long-term current use of insulin (HCC) Fairly stable with a hemoglobin A1c of 7.4.  No changes in her care at this time.  She knows to work on her dietary changes. - POCT HgB A1C  2. Polyneuropathy associated with underlying disease ([place]) This is persistently bothering her at night.  Increase her gabapentin to 600 mg in the morning and 1200 at  bedtime. - gabapentin (NEURONTIN) 600 MG tablet; Take 1 tablet (600 mg total) by mouth 3 (three) times daily.  Dispense: 270 tablet; Refill: 3  3. Gastroenteritis and colitis, viral Clinically this is resolving.  Her abdomen is soft.  No further intervention.  4. Coronary artery disease involving native heart, angina presence unspecified, unspecified vessel or lesion type All risk factors treated.  5. Pneumonia due to COVID-19 virus Hopefully she will be slowly improving from Covid.  She also has follow-up with pulmonology   No follow-ups on file.      I, Wilhemena Durie, MD, have reviewed all documentation for this visit. The documentation on 01/21/20 for the exam, diagnosis, procedures, and orders are all accurate and complete.    Whitley Strycharz [place] Mon, MD  [facility] [phone] (phone) [phone] (fax)  [place]

## 2020-01-19 ENCOUNTER — Ambulatory Visit (INDEPENDENT_AMBULATORY_CARE_PROVIDER_SITE_OTHER): Payer: PPO | Admitting: Family Medicine

## 2020-01-19 ENCOUNTER — Other Ambulatory Visit: Payer: Self-pay

## 2020-01-19 ENCOUNTER — Encounter: Payer: Self-pay | Admitting: Family Medicine

## 2020-01-19 VITALS — BP 100/68 | HR 69 | Temp 97.1°F | Ht 62.0 in | Wt 202.8 lb

## 2020-01-19 DIAGNOSIS — E1140 Type 2 diabetes mellitus with diabetic neuropathy, unspecified: Secondary | ICD-10-CM | POA: Diagnosis not present

## 2020-01-19 DIAGNOSIS — G63 Polyneuropathy in diseases classified elsewhere: Secondary | ICD-10-CM

## 2020-01-19 DIAGNOSIS — U071 COVID-19: Secondary | ICD-10-CM

## 2020-01-19 DIAGNOSIS — A084 Viral intestinal infection, unspecified: Secondary | ICD-10-CM

## 2020-01-19 DIAGNOSIS — I2510 Atherosclerotic heart disease of native coronary artery without angina pectoris: Secondary | ICD-10-CM

## 2020-01-19 DIAGNOSIS — J1282 Pneumonia due to coronavirus disease 2019: Secondary | ICD-10-CM | POA: Diagnosis not present

## 2020-01-19 LAB — POCT GLYCOSYLATED HEMOGLOBIN (HGB A1C)
Estimated Average Glucose: 166
Hemoglobin A1C: 7.4 % — AB (ref 4.0–5.6)

## 2020-01-19 MED ORDER — GABAPENTIN 600 MG PO TABS: 600.0000 mg | ORAL_TABLET | Freq: Three times a day (TID) | ORAL | 3 refills | Status: DC

## 2020-01-19 NOTE — Patient Instructions (Signed)
CLEAR LIQUID DIET TODAY AND THEN ADVANCE!!!

## 2020-02-01 ENCOUNTER — Other Ambulatory Visit: Payer: Self-pay | Admitting: Family Medicine

## 2020-02-01 NOTE — Telephone Encounter (Signed)
Requested Prescriptions  Pending Prescriptions Disp Refills   levothyroxine (SYNTHROID) 88 MCG tablet [Pharmacy Med Name: LEVOTHYROXINE 88 MCG TABLET] 90 tablet 3    Sig: TAKE 1 TABLET BY MOUTH EVERY DAY     Endocrinology:  Hypothyroid Agents Failed - 02/01/2020 12:16 PM      Failed - TSH needs to be rechecked within 3 months after an abnormal result. Refill until TSH is due.      Passed - TSH in normal range and within 360 days    TSH  Date Value Ref Range Status  09/18/2019 1.310 0.450 - 4.500 uIU/mL Final         Passed - Valid encounter within last 12 months    Recent Outpatient Visits          1 week ago Type 2 diabetes mellitus with diabetic neuropathy, without long-term current use of insulin [facility])   [facility] [name], MD   4 months ago Annual physical exam   [facility] [name], MD   6 months ago History of 2019 novel coronavirus disease (COVID-19)   [facility] [name], MD   7 months ago Oxygen desaturation   [facility] Flinchum, Kelby Aline, FNP   1 year ago Type 2 diabetes mellitus without complication, unspecified whether long term insulin use [facility])   [facility] [name], MD      Future Appointments            In 2 months [name], MD [facility] [place]

## 2020-02-05 ENCOUNTER — Ambulatory Visit: Payer: PPO | Admitting: Podiatry

## 2020-02-12 DIAGNOSIS — Z20822 Contact with and (suspected) exposure to covid-19: Secondary | ICD-10-CM | POA: Diagnosis not present

## 2020-02-14 ENCOUNTER — Other Ambulatory Visit: Payer: Self-pay | Admitting: Family Medicine

## 2020-02-14 DIAGNOSIS — J431 Panlobular emphysema: Secondary | ICD-10-CM | POA: Diagnosis not present

## 2020-02-14 NOTE — Telephone Encounter (Signed)
Requested Prescriptions  Pending Prescriptions Disp Refills  . Continuous Blood Gluc Sensor (FREESTYLE LIBRE 14 DAY SENSOR) MISC [Pharmacy Med Name: FREESTYLE LIBRE 14 DAY SENSOR] 2 each 1    Sig: USE EVERY 41 DAYS AS DIRECTED     Endocrinology: Diabetes - Testing Supplies Passed - 02/14/2020  8:55 AM      Passed - Valid encounter within last 12 months    Recent Outpatient Visits          3 weeks ago Type 2 diabetes mellitus with diabetic neuropathy, without long-term current use of insulin [facility])   [facility] [name], MD   4 months ago Annual physical exam   [facility] [name], MD   7 months ago History of 2019 novel coronavirus disease (COVID-19)   [facility] [name], MD   7 months ago Oxygen desaturation   [facility] Flinchum, Kelby Aline, FNP   1 year ago Type 2 diabetes mellitus without complication, unspecified whether long term insulin use [facility])   [facility] [name], MD      Future Appointments            In 2 months [name], MD [facility], [place]

## 2020-02-21 DIAGNOSIS — J029 Acute pharyngitis, unspecified: Secondary | ICD-10-CM | POA: Diagnosis not present

## 2020-02-21 DIAGNOSIS — J0100 Acute maxillary sinusitis, unspecified: Secondary | ICD-10-CM | POA: Diagnosis not present

## 2020-02-25 ENCOUNTER — Other Ambulatory Visit: Payer: Self-pay | Admitting: *Deleted

## 2020-02-25 DIAGNOSIS — E1140 Type 2 diabetes mellitus with diabetic neuropathy, unspecified: Secondary | ICD-10-CM

## 2020-02-25 MED ORDER — FREESTYLE LIBRE 2 READER DEVI: 1.0000 | 0 refills | Status: DC

## 2020-02-27 ENCOUNTER — Telehealth: Payer: Self-pay | Admitting: Family Medicine

## 2020-02-27 NOTE — Telephone Encounter (Signed)
Medication Refill - Medication: Libre reader 2  Has the patient contacted their pharmacy? Yes.   (Agent: If no, request that the patient contact the pharmacy for the refill.) (Agent: If yes, when and what did the pharmacy advise?)  Preferred Pharmacy (with phone number or street name):[id number] - [place], [place] - [address]   Agent: Please be advised that RX refills may take up to 3 business days. We ask that you follow-up with your pharmacy.

## 2020-02-29 ENCOUNTER — Other Ambulatory Visit: Payer: Self-pay | Admitting: Family Medicine

## 2020-02-29 NOTE — Telephone Encounter (Signed)
Requested Prescriptions  Pending Prescriptions Disp Refills   metFORMIN (GLUCOPHAGE) 1000 MG tablet [Pharmacy Med Name: METFORMIN HCL 1,000 MG TABLET] 180 tablet 0    Sig: TAKE 1 TABLET BY MOUTH TWICE A DAY     Endocrinology:  Diabetes - Biguanides Passed - 02/29/2020  4:59 PM      Passed - Cr in normal range and within 360 days    Creatinine  Date Value Ref Range Status  05/14/2014 0.73 0.60 - 1.30 mg/dL Final   Creatinine, Ser  Date Value Ref Range Status  09/18/2019 0.67 0.57 - 1.00 mg/dL Final         Passed - HBA1C is between 0 and 7.9 and within 180 days    Hemoglobin A1C  Date Value Ref Range Status  01/19/2020 7.4 (A) 4.0 - 5.6 % Final  04/04/2013 7.6 (H) 4.2 - 6.3 % Final    Comment:    The American Diabetes Association recommends that a primary goal of therapy should be <7% and that physicians should reevaluate the treatment regimen in patients with HbA1c values consistently >8%.    Hgb A1c MFr Bld  Date Value Ref Range Status  09/18/2019 7.4 (H) 4.8 - 5.6 % Final    Comment:             Prediabetes: 5.7 - 6.4          Diabetes: >6.4          Glycemic control for adults with diabetes: <7.0          Passed - eGFR in normal range and within 360 days    EGFR (African American)  Date Value Ref Range Status  05/14/2014 >60 >72m/min Final  04/04/2013 >60  Final   GFR calc Af Amer  Date Value Ref Range Status  09/18/2019 100 >59 mL/min/1.73 Final   EGFR (Non-African Amer.)  Date Value Ref Range Status  05/14/2014 >60 >679mmin Final    Comment:    eGFR values <6065min/1.73 m2 may be an indication of chronic kidney disease (CKD). Calculated eGFR, using the MRDR Study equation, is useful in  patients with stable renal function. The eGFR calculation will not be reliable in acutely ill patients when serum creatinine is changing rapidly. It is not useful in patients on dialysis. The eGFR calculation may not be applicable to patients at the low and high  extremes of body sizes, pregnant women, and vegetarians.   04/04/2013 >60  Final    Comment:    eGFR values <56m38mn/1.73 m2 may be an indication of chronic kidney disease (CKD). Calculated eGFR is useful in patients with stable renal function. The eGFR calculation will not be reliable in acutely ill patients when serum creatinine is changing rapidly. It is not useful in  patients on dialysis. The eGFR calculation may not be applicable to patients at the low and high extremes of body sizes, pregnant women, and vegetarians.    GFR calc non Af Amer  Date Value Ref Range Status  09/18/2019 87 >59 mL/min/1.73 Final         Passed - Valid encounter within last 6 months    Recent Outpatient Visits          1 month ago Type 2 diabetes mellitus with diabetic neuropathy, without long-term current use of insulin [facility]   5 months ago [facility] physical [facility]   7 months  ago History of 2019 novel coronavirus disease (COVID-19)   [facility] [name], MD   8 months ago Oxygen desaturation   [facility], Kelby Aline, FNP   1 year ago Type 2 diabetes mellitus without complication, unspecified whether long term insulin use [facility])   [facility] [name], MD      Future Appointments            In 1 month [name], MD [facility], [place]

## 2020-03-01 NOTE — Telephone Encounter (Signed)
This was sent on 02/14/2020. Will check with pharmacy.

## 2020-03-16 DIAGNOSIS — J431 Panlobular emphysema: Secondary | ICD-10-CM | POA: Diagnosis not present

## 2020-03-22 DIAGNOSIS — R600 Localized edema: Secondary | ICD-10-CM | POA: Diagnosis not present

## 2020-03-22 DIAGNOSIS — K219 Gastro-esophageal reflux disease without esophagitis: Secondary | ICD-10-CM | POA: Diagnosis not present

## 2020-03-22 DIAGNOSIS — E119 Type 2 diabetes mellitus without complications: Secondary | ICD-10-CM | POA: Diagnosis not present

## 2020-03-22 DIAGNOSIS — R0902 Hypoxemia: Secondary | ICD-10-CM | POA: Diagnosis not present

## 2020-03-22 DIAGNOSIS — E782 Mixed hyperlipidemia: Secondary | ICD-10-CM | POA: Diagnosis not present

## 2020-03-22 DIAGNOSIS — R002 Palpitations: Secondary | ICD-10-CM | POA: Diagnosis not present

## 2020-03-22 DIAGNOSIS — R0602 Shortness of breath: Secondary | ICD-10-CM | POA: Diagnosis not present

## 2020-03-22 DIAGNOSIS — Z8616 Personal history of COVID-19: Secondary | ICD-10-CM | POA: Diagnosis not present

## 2020-03-22 DIAGNOSIS — I2510 Atherosclerotic heart disease of native coronary artery without angina pectoris: Secondary | ICD-10-CM | POA: Diagnosis not present

## 2020-03-22 DIAGNOSIS — R001 Bradycardia, unspecified: Secondary | ICD-10-CM | POA: Diagnosis not present

## 2020-03-22 DIAGNOSIS — I272 Pulmonary hypertension, unspecified: Secondary | ICD-10-CM | POA: Diagnosis not present

## 2020-03-28 ENCOUNTER — Other Ambulatory Visit: Payer: Self-pay | Admitting: Family Medicine

## 2020-03-29 ENCOUNTER — Other Ambulatory Visit: Payer: Self-pay | Admitting: Family Medicine

## 2020-03-29 DIAGNOSIS — E118 Type 2 diabetes mellitus with unspecified complications: Secondary | ICD-10-CM

## 2020-04-01 ENCOUNTER — Other Ambulatory Visit: Payer: Self-pay | Admitting: Family Medicine

## 2020-04-13 ENCOUNTER — Other Ambulatory Visit: Payer: Self-pay | Admitting: Family Medicine

## 2020-04-16 DIAGNOSIS — J431 Panlobular emphysema: Secondary | ICD-10-CM | POA: Diagnosis not present

## 2020-04-19 NOTE — Progress Notes (Deleted)
Established patient visit   Patient: Teresa [name]   DOB: 1946/07/10   74 y.o. Female  MRN: [id number] Visit Date: 04/20/2020  Today's healthcare provider: Wilhemena Durie, MD   No chief complaint on file.  Subjective    HPI  Diabetes Mellitus Type II, follow-up  Lab Results  Component Value Date   HGBA1C 7.4 (A) 01/19/2020   HGBA1C 7.4 (H) 09/18/2019   HGBA1C 8.4 (H) 05/17/2019   Last seen for diabetes 3 months ago.  Management since then includes; Fairly stable with a hemoglobin A1c of 7.4.  No changes in her care at this time.  She knows to work on her dietary changes. She reports {excellent/good/fair/poor:19665} compliance with treatment. She {is/is not:21021397} having side effects. {document side effects if present:1}  Home blood sugar records: {diabetes glucometry results:16657}  Episodes of hypoglycemia? {Yes/No:20286} {enter details if yes:1}   Current insulin regiment: {***Type 'None' if not taking insulin                                                otherwise enter complete                                                 details of insulin regiment:1} Most Recent Eye Exam: ***  --------------------------------------------------------------------------------------------------- Hypertension, follow-up  BP Readings from Last 3 Encounters:  01/19/20 100/68  09/18/19 116/71  07/15/19 104/64   Wt Readings from Last 3 Encounters:  01/19/20 202 lb 12.8 oz (92 kg)  09/18/19 200 lb (90.7 kg)  07/15/19 194 lb (88 kg)     She was last seen for hypertension {NUMBERS 1-12:18279} {days/wks/mos/yrs:310907} ago.  BP at that visit was ***. Management since that visit includes ***. She reports {excellent/good/fair/poor:19665} compliance with treatment. She {is/is not:9024} having side effects. {document side effects if present:1} She {is/is not:9024} exercising. She {is/is not:9024} adherent to low salt diet.   Outside blood pressures are {enter patient  reported home BP, or 'not being checked':1}.  She {does/does not:200015} smoke.  Use of agents associated with hypertension: {bp agents assoc with hypertension:511::"none"}.   ---------------------------------------------------------------------------------------------------   Polyneuropathy associated with underlying disease ([place]) From 01/19/2020-This is persistently bothering her at night.  Increased her gabapentin to 600 mg in the morning and 1200 at bedtime.  Pneumonia due to COVID-19 virus From 01/19/2020-Hopefully she will be slowly improving from Covid.  She also has follow-up with pulmonology.    {Show patient history (optional):23778::" "}   Medications: Outpatient Medications Prior to Visit  Medication Sig   acetaminophen (TYLENOL) 500 MG tablet Take 500 mg by mouth every 6 (six) hours as needed for mild pain or headache.   allopurinol (ZYLOPRIM) 100 MG tablet TAKE 1 TABLET BY MOUTH EVERY DAY   atorvastatin (LIPITOR) 10 MG tablet TAKE 1 TABLET BY MOUTH EVERY DAY AT BEDTIME   benzonatate (TESSALON) 200 MG capsule Take 1 capsule (200 mg total) by mouth 3 (three) times daily. (Patient not taking: Reported on 12/05/2019)   budesonide (PULMICORT) 0.5 MG/2ML nebulizer solution Inhale into the lungs.   colchicine 0.6 MG tablet Take 0.6 mg by mouth daily as needed (for gout flare-ups).    Continuous Blood Gluc Receiver (FREESTYLE LIBRE 2 READER)  DEVI 1 each by Does not apply route continuous.   Continuous Blood Gluc Sensor (FREESTYLE LIBRE 2 SENSOR) MISC USE EVERY 14 DAYS AS DIRECTED   furosemide (LASIX) 40 MG tablet TAKE 1 TABLET BY MOUTH EVERY DAY   gabapentin (NEURONTIN) 600 MG tablet Take 1 tablet (600 mg total) by mouth 3 (three) times daily.   glimepiride (AMARYL) 4 MG tablet TAKE 1 TABLET (4 MG TOTAL) BY MOUTH 2 (TWO) TIMES DAILY.   guaiFENesin-dextromethorphan (ROBITUSSIN DM) 100-10 MG/5ML syrup Take 5 mLs by mouth every 4 (four) hours as needed for cough.  (Patient not taking: Reported on 12/05/2019)   JARDIANCE 25 MG TABS tablet TAKE 25 MG BY MOUTH DAILY.   levothyroxine (SYNTHROID) 88 MCG tablet TAKE 1 TABLET BY MOUTH EVERY DAY   losartan (COZAAR) 25 MG tablet Take 1 tablet (25 mg total) by mouth daily.   menthol-cetylpyridinium (CEPACOL) 3 MG lozenge Take 1 lozenge (3 mg total) by mouth as needed for sore throat. (Patient not taking: Reported on 12/05/2019)   metFORMIN (GLUCOPHAGE) 1000 MG tablet TAKE 1 TABLET BY MOUTH TWICE A DAY   nystatin cream (MYCOSTATIN) Apply 1 application topically 2 (two) times daily. (Patient taking differently: Apply 1 application topically 2 (two) times daily as needed (for diabetic yeast reactions.). )   ondansetron (ZOFRAN) 4 MG tablet TAKE 1 TABLET BY MOUTH EVERY 8 HOURS AS NEEDED FOR NAUSEA AND VOMITING   OZEMPIC, 0.25 OR 0.5 MG/DOSE, 2 MG/1.5ML SOPN INJECT 0.5MG  INTO THE SKIN ONCE A WEEK.   pioglitazone (ACTOS) 30 MG tablet TAKE 1 TABLET BY MOUTH EVERY DAY   No facility-administered medications prior to visit.    Review of Systems  Constitutional: Negative for appetite change, chills, fatigue and fever.  Respiratory: Negative for chest tightness and shortness of breath.   Cardiovascular: Negative for chest pain and palpitations.  Gastrointestinal: Negative for abdominal pain, nausea and vomiting.  Neurological: Negative for dizziness and weakness.    {Heme   Chem   Endocrine   Serology   Results Review (optional):23779::" "}  Objective    There were no vitals taken for this visit. {Show previous vital signs (optional):23777::" "}  Physical Exam  ***  No results found for any visits on 04/20/20.  Assessment & Plan     ***  No follow-ups on file.      {provider attestation***:1}   Wilhemena Durie, MD  [facility] [phone] (phone) [phone] (fax)  [employer] [facility] [name]

## 2020-04-20 ENCOUNTER — Ambulatory Visit: Payer: Self-pay | Admitting: Family Medicine

## 2020-04-30 ENCOUNTER — Ambulatory Visit: Payer: PPO | Admitting: Pharmacist

## 2020-04-30 ENCOUNTER — Other Ambulatory Visit: Payer: Self-pay

## 2020-04-30 DIAGNOSIS — J42 Unspecified chronic bronchitis: Secondary | ICD-10-CM

## 2020-04-30 DIAGNOSIS — U071 COVID-19: Secondary | ICD-10-CM

## 2020-04-30 NOTE — Chronic Care Management (AMB) (Signed)
Chronic Care Management Pharmacy  Name: MARIELLE MANTIONE  MRN: [id number] DOB: [birth year]  Chief Complaint/ HPI  Teresa [name],  74 y.o. , female presents for their Follow-Up CCM visit with the clinical pharmacist via telephone due to COVID-19 Pandemic.  PCP : [name], MD  Their chronic conditions include: COPD, HLD, DM  Office Visits: NA  Consult Visit: NA 8/16 palpitations, [place], BP 128/70 P 83  Medications: Outpatient Encounter Medications as of 04/30/2020  Medication Sig Note  . acetaminophen (TYLENOL) 500 MG tablet Take 500 mg by mouth every 6 (six) hours as needed for mild pain or headache.   . allopurinol (ZYLOPRIM) 100 MG tablet TAKE 1 TABLET BY MOUTH EVERY DAY 05/17/2019: Takes at bedtime  . atorvastatin (LIPITOR) 10 MG tablet TAKE 1 TABLET BY MOUTH EVERY DAY AT BEDTIME   . benzonatate (TESSALON) 200 MG capsule Take 1 capsule (200 mg total) by mouth 3 (three) times daily. (Patient not taking: Reported on 12/05/2019)   . budesonide (PULMICORT) 0.5 MG/2ML nebulizer solution Inhale into the lungs.   . colchicine 0.6 MG tablet Take 0.6 mg by mouth daily as needed (for gout flare-ups).    . Continuous Blood Gluc Receiver (FREESTYLE LIBRE 2 READER) DEVI 1 each by Does not apply route continuous.   . Continuous Blood Gluc Sensor (FREESTYLE LIBRE 2 SENSOR) MISC USE EVERY 14 DAYS AS DIRECTED   . furosemide (LASIX) 40 MG tablet TAKE 1 TABLET BY MOUTH EVERY DAY   . gabapentin (NEURONTIN) 600 MG tablet Take 1 tablet (600 mg total) by mouth 3 (three) times daily.   Marland Kitchen glimepiride (AMARYL) 4 MG tablet TAKE 1 TABLET (4 MG TOTAL) BY MOUTH 2 (TWO) TIMES DAILY.   Marland Kitchen guaiFENesin-dextromethorphan (ROBITUSSIN DM) 100-10 MG/5ML syrup Take 5 mLs by mouth every 4 (four) hours as needed for cough. (Patient not taking: Reported on 12/05/2019)   . JARDIANCE 25 MG TABS tablet TAKE 25 MG BY MOUTH DAILY.   Marland Kitchen levothyroxine (SYNTHROID) 88 MCG tablet TAKE 1 TABLET BY MOUTH EVERY DAY   .  losartan (COZAAR) 25 MG tablet Take 1 tablet (25 mg total) by mouth daily.   Marland Kitchen menthol-cetylpyridinium (CEPACOL) 3 MG lozenge Take 1 lozenge (3 mg total) by mouth as needed for sore throat. (Patient not taking: Reported on 12/05/2019)   . metFORMIN (GLUCOPHAGE) 1000 MG tablet TAKE 1 TABLET BY MOUTH TWICE A DAY   . nystatin cream (MYCOSTATIN) Apply 1 application topically 2 (two) times daily. (Patient taking differently: Apply 1 application topically 2 (two) times daily as needed (for diabetic yeast reactions.). )   . ondansetron (ZOFRAN) 4 MG tablet TAKE 1 TABLET BY MOUTH EVERY 8 HOURS AS NEEDED FOR NAUSEA AND VOMITING   . OZEMPIC, 0.25 OR 0.5 MG/DOSE, 2 MG/1.5ML SOPN INJECT 0.5MG INTO THE SKIN ONCE A WEEK.   . pioglitazone (ACTOS) 30 MG tablet TAKE 1 TABLET BY MOUTH EVERY DAY    No facility-administered encounter medications on file as of 04/30/2020.     Financial Resource Strain: High Risk  . Difficulty of Paying Living Expenses: Very hard   Current Diagnosis/Assessment:  Goals Addressed              This Visit's Progress   .  Diabetes Mellitus - goal A1c < 7% (pt-stated)        CARE PLAN ENTRY (see longitudinal plan of care for additional care plan information)  Current Barriers:  . Diabetes: Type 2; complicated by chronic medical conditions  including HF, pulmonary HTN, hypothyroidism Lab Results  Component Value Date   HGBA1C 7.4 (H) 09/18/2019 .   Lab Results  Component Value Date   CREATININE 0.67 09/18/2019   CREATININE 0.78 06/24/2019   CREATININE 0.56 05/27/2019 .   Marland Kitchen No results found for: EGFR . Current antihyperglycemic regimen: Ozempic, Jardiance, metformin . Denies hypoglycemic symptoms . Denies hyperglycemic symptoms . Current exercise: unable to walk due to neuropathy . Neuropathy worse after Covid-19  Pharmacist Clinical Goal(s):  Marland Kitchen Over the next 90 days, patient will work with PharmD and primary care provider to address diabetic  neuropathy  Interventions: . Comprehensive medication review performed, medication list updated in electronic medical record . Inter-disciplinary care team collaboration (see longitudinal plan of care) . Increased gabapentin  Patient Self Care Activities:  . Patient will check blood glucose daily , document, and provide at future appointments . Patient will take medications as prescribed . Patient will restart Jardiance and Ozempic which are $9 with LIS . Patient will contact provider with any episodes of hypoglycemia . Patient will increase exercise over next 90 days after neuropathic pain reduced  Initial goal documentation        Diabetes   Recent Relevant Labs: Lab Results  Component Value Date/Time   HGBA1C 7.4 (A) 01/19/2020 10:29 AM   HGBA1C 7.4 (H) 09/18/2019 11:21 AM   HGBA1C 8.4 (H) 05/17/2019 03:05 AM   HGBA1C 7.6 (H) 04/04/2013 10:47 AM   MICROALBUR 50 06/06/2017 11:26 AM   MICROALBUR 20 12/08/2015 10:39 AM     Checking BG: Rarely  Patient has failed these meds in past: NA Patient is currently uncontrolled on the following medications: metformin 1050m twice daily, Actos 343mdaily  Last diabetic Foot exam:  Lab Results  Component Value Date/Time   HMDIABEYEEXA No Retinopathy 07/07/2019 12:00 AM    Last diabetic Eye exam: No results found for: HMDIABFOOTEX   We discussed:  Take Jardiance $9 Take Ozempic $9 Gabapentin increased again? Yes, Working much better  PlAutomatic Dataedications as soon as possible  Long Term Covid   Eosinophil count:   Lab Results  Component Value Date/Time   EOSPCT 0 05/26/2019 05:00 AM   EOSPCT 4.1 04/04/2013 10:47 AM  %                               Eos (Absolute):  Lab Results  Component Value Date/Time   EOSABS 0.3 09/18/2019 11:21 AM   EOSABS 0.3 04/04/2013 10:47 AM    Tobacco Status:  Social History   Tobacco Use  Smoking Status Never Smoker  Smokeless Tobacco Never Used    Patient has failed these  meds in past: NA Patient is currently uncontrolled on the following medications: budesonide SVN twice daily Using maintenance inhaler regularly? No Frequency of rescue inhaler use:  never  We discussed: What breathing medication? Budesonide twice daily  Plan  Continue current medications  Medication Management   We discussed:  Patient losing home Needs referall to social worker Has LIS but still can't afford meds  Plan  Continue current medication management strategy  Follow up: 1 month phone visit  TeMilus HeightPharmD, BCCharlotte HallCTClio Medical [phone]

## 2020-05-02 NOTE — Patient Instructions (Addendum)
Visit Information  Goals Addressed              This Visit's Progress   .  Diabetes Mellitus - goal A1c < 7% (pt-stated)        CARE PLAN ENTRY (see longitudinal plan of care for additional care plan information)  Current Barriers:  . Diabetes: Type 2; complicated by chronic medical conditions including HF, pulmonary HTN, hypothyroidism Lab Results  Component Value Date   HGBA1C 7.4 (H) 09/18/2019 .   Lab Results  Component Value Date   CREATININE 0.67 09/18/2019   CREATININE 0.78 06/24/2019   CREATININE 0.56 05/27/2019 .   Marland Kitchen No results found for: EGFR . Current antihyperglycemic regimen: Ozempic, Jardiance, metformin . Denies hypoglycemic symptoms . Denies hyperglycemic symptoms . Current exercise: unable to walk due to neuropathy . 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 Comprehensive medication review performed, medication list updated in electronic medical record . Inter-disciplinary care team collaboration (see longitudinal plan of care) . Increased gabapentin  Patient Self Care Activities:  . Patient will check blood glucose daily , document, and provide at future appointments . Patient will take medications as prescribed . Patient will restart Jardiance and Ozempic which are $9 with LIS . Patient will contact provider with any episodes of hypoglycemia . Patient will increase exercise over next 90 days after neuropathic pain reduced  Initial goal documentation        Print copy of patient instructions provided.   Telephone follow up appointment with pharmacy team member scheduled for: 3 months  Milus Height, PharmD, [place], [place] [phone]  COVID-19 Frequently Asked Questions COVID-19 (coronavirus disease) is an infection that is caused by a large family  of viruses. Some viruses cause illness in people and others cause illness in animals like camels, cats, and bats. In some cases, the viruses that cause illness in animals can spread to humans. Where did the coronavirus come from? In December 2019, [place] told the [facility]) of several cases of lung disease (human respiratory illness). These cases were linked to an open seafood and livestock market in the city of [place]. The link to the seafood and livestock market suggests that the virus may have spread from animals to humans. However, since that first outbreak in December, the virus has also been shown to spread from person to person. What is the name of the disease and the virus? Disease name Early on, this disease was called novel coronavirus. This is because scientists determined that the disease was caused by a new (novel) respiratory virus. The World Health Organization [facility]) has now named the disease COVID-19, or coronavirus disease. Virus name The virus that causes the disease is called severe acute respiratory syndrome coronavirus 2 (SARS-CoV-2). More information on disease and virus naming World Health Organization [facility]): www.who.int/emergencies/diseases/novel-coronavirus-2019/technical-guidance/naming-the-coronavirus-disease-(covid-2019)-and-the-virus-that-causes-it Who is at risk for complications from coronavirus disease? Some people may be at higher risk for complications from coronavirus disease. This includes older adults and people who have chronic diseases, such as heart disease, diabetes, and lung disease. If you are at higher risk for complications, take these extra precautions:  Stay home as much as possible.  Avoid social gatherings and travel.  Avoid close contact with others. Stay at least 6 ft (2 m) away from others, if possible.  Wash your hands often with soap  and water for at least 20 seconds.  Avoid touching your face, mouth, nose, or  eyes.  Keep supplies on hand at home, such as food, medicine, and cleaning supplies.  If you must go out in public, wear a cloth face covering or face mask. Make sure your mask covers your nose and mouth. How does coronavirus disease spread? The virus that causes coronavirus disease spreads easily from person to person (is contagious). You may catch the virus by:  Breathing in droplets from an infected person. Droplets can be spread by a person breathing, speaking, singing, coughing, or sneezing.  Touching something, like a table or a doorknob, that was exposed to the virus (contaminated) and then touching your mouth, nose, or eyes. Can I get the virus from touching surfaces or objects? There is still a lot that we do not know about the virus that causes coronavirus disease. Scientists are basing a lot of information on what they know about similar viruses, such as:  Viruses cannot generally survive on surfaces for long. They need a human body (host) to survive.  It is more likely that the virus is spread by close contact with people who are sick (direct contact), such as through: ? Shaking hands or hugging. ? Breathing in respiratory droplets that travel through the air. Droplets can be spread by a person breathing, speaking, singing, coughing, or sneezing.  It is less likely that the virus is spread when a person touches a surface or object that has the virus on it (indirect contact). The virus may be able to enter the body if the person touches a surface or object and then touches his or her face, eyes, nose, or mouth. Can a person spread the virus without having symptoms of the disease? It may be possible for the virus to spread before a person has symptoms of the disease, but this is most likely not the main way the virus is spreading. It is more likely for the virus to spread by being in close contact with people who are sick and breathing in the respiratory droplets spread by a person  breathing, speaking, singing, coughing, or sneezing. What are the symptoms of coronavirus disease? Symptoms vary from person to person and can range from mild to severe. Symptoms may include:  Fever or chills.  Cough.  Difficulty breathing or feeling short of breath.  Headaches, body aches, or muscle aches.  Runny or stuffy (congested) nose.  Sore throat.  New loss of taste or smell.  Nausea, vomiting, or diarrhea. These symptoms can appear anywhere from 2 to 14 days after you have been exposed to the virus. Some people may not have any symptoms. If you develop symptoms, call your health care provider. People with severe symptoms may need hospital care. Should I be tested for this virus? Your health care provider will decide whether to test you based on your symptoms, history of exposure, and your risk factors. How does a health care provider test for this virus? Health care providers will collect samples to send for testing. Samples may include:  Taking a swab of fluid from the back of your nose and throat, your nose, or your throat.  Taking fluid from the lungs by having you cough up mucus (sputum) into a sterile cup.  Taking a blood sample. Is there a treatment or vaccine for this virus? Currently, there is no vaccine to prevent coronavirus disease. Also, there are no medicines like antibiotics or antivirals to treat the virus. A  person who becomes sick is given supportive care, which means rest and fluids. A person may also relieve his or her symptoms by using over-the-counter medicines that treat sneezing, coughing, and runny nose. These are the same medicines that a person takes for the common cold. If you develop symptoms, call your health care provider. People with severe symptoms may need hospital care. What can I do to protect myself and my family from this virus?     You can protect yourself and your family by taking the same actions that you would take to prevent the  spread of other viruses. Take the following actions:  Wash your hands often with soap and water for at least 20 seconds. If soap and water are not available, use alcohol-based hand sanitizer.  Avoid touching your face, mouth, nose, or eyes.  Cough or sneeze into a tissue, sleeve, or elbow. Do not cough or sneeze into your hand or the air. ? If you cough or sneeze into a tissue, throw it away immediately and wash your hands.  Disinfect objects and surfaces that you frequently touch every day.  Stay away from people who are sick.  Avoid going out in public, follow guidance from your state and local health authorities.  Avoid crowded indoor spaces. Stay at least 6 ft (2 m) away from others.  If you must go out in public, wear a cloth face covering or face mask. Make sure your mask covers your nose and mouth.  Stay home if you are sick, except to get medical care. Call your health care provider before you get medical care. Your health care provider will tell you how long to stay home.  Make sure your vaccines are up to date. Ask your health care provider what vaccines you need. What should I do if I need to travel? Follow travel recommendations from your local health authority, the CDC, and WHO. Travel information and advice  Centers for Disease Control and Prevention (CDC): BodyEditor.hu  World Health Organization [facility]): ThirdIncome.ca Know the risks and take action to protect your health  You are at higher risk of getting coronavirus disease if you are traveling to areas with an outbreak or if you are exposed to travelers from areas with an outbreak.  Wash your hands often and practice good hygiene to lower the risk of catching or spreading the virus. What should I do if I am sick? General instructions to stop the spread of infection  Wash your hands often with soap and water for at  least 20 seconds. If soap and water are not available, use alcohol-based hand sanitizer.  Cough or sneeze into a tissue, sleeve, or elbow. Do not cough or sneeze into your hand or the air.  If you cough or sneeze into a tissue, throw it away immediately and wash your hands.  Stay home unless you must get medical care. Call your health care provider or local health authority before you get medical care.  Avoid public areas. Do not take public transportation, if possible.  If you can, wear a mask if you must go out of the house or if you are in close contact with someone who is not sick. Make sure your mask covers your nose and mouth. Keep your home clean  Disinfect objects and surfaces that are frequently touched every day. This may include: ? Counters and tables. ? Doorknobs and light switches. ? Sinks and faucets. ? Electronics such as phones, remote controls, keyboards, computers, and tablets.  Wash dishes in hot, soapy water or use a dishwasher. Air-dry your dishes.  Wash laundry in hot water. Prevent infecting other household members  Let healthy household members care for children and pets, if possible. If you have to care for children or pets, wash your hands often and wear a mask.  Sleep in a different bedroom or bed, if possible.  Do not share personal items, such as razors, toothbrushes, deodorant, combs, brushes, towels, and washcloths. Where to find more information Centers for Disease Control and Prevention (CDC)  Information and news updates: https://www.butler-gonzalez.com/ World Health Organization [facility])  Information and news updates: MissExecutive.com.ee  Coronavirus health topic: https://www.castaneda.info/  Questions and answers on COVID-19: OpportunityDebt.at  Global tracker: who.sprinklr.com American Academy of Pediatrics (AAP)  Information for families:  www.healthychildren.org/English/health-issues/conditions/chest-lungs/Pages/2019-Novel-Coronavirus.aspx The coronavirus situation is changing rapidly. Check your local health authority website or the CDC and [facility] websites for updates and news. When should I contact a health care provider?  Contact your health care provider if you have symptoms of an infection, such as fever or cough, and you: ? Have been near anyone who is known to have coronavirus disease. ? Have come into contact with a person who is suspected to have coronavirus disease. ? Have traveled to an area where there is an outbreak of COVID-19. When should I get emergency medical care?  Get help right away by calling your local emergency services (911 in the [place] if you have: ? Trouble breathing. ? Pain or pressure in your chest. ? Confusion. ? Blue-tinged lips and fingernails. ? Difficulty waking from sleep. ? Symptoms that get worse. Let the emergency medical personnel know if you think you have coronavirus disease. Summary  A new respiratory virus is spreading from person to person and causing COVID-19 (coronavirus disease).  The virus that causes COVID-19 appears to spread easily. It spreads from one person to another through droplets from breathing, speaking, singing, coughing, or sneezing.  Older adults and those with chronic diseases are at higher risk of disease. If you are at higher risk for complications, take extra precautions.  There is currently no vaccine to prevent coronavirus disease. There are no medicines, such as antibiotics or antivirals, to treat the virus.  You can protect yourself and your family by washing your hands often, avoiding touching your face, and covering your coughs and sneezes. This information is not intended to replace advice given to you by your health care provider. Make sure you discuss any questions you have with your health care provider. Document Revised: 05/23/2019 Document  Reviewed: 11/19/2018 Elsevier Patient Education  [place].

## 2020-05-03 ENCOUNTER — Ambulatory Visit: Payer: Self-pay

## 2020-05-03 DIAGNOSIS — E1140 Type 2 diabetes mellitus with diabetic neuropathy, unspecified: Secondary | ICD-10-CM

## 2020-05-03 DIAGNOSIS — I10 Essential (primary) hypertension: Secondary | ICD-10-CM

## 2020-05-03 NOTE — Patient Instructions (Addendum)
Thank you allowing the Chronic Care Management Team to be a part of your care! It was a pleasure speaking with you today!  1. Please follow up with [facility] for Housing options [phone]  CCM (Chronic Care Management) Team   Neldon Labella  RN, BSN Nurse Care Coordinator  [phone]  [place], LCSW Clinical Social Worker [phone]  Goals Addressed              This Visit's Progress   .  "I need to move out of my home by Thursday" (pt-stated)        CARE PLAN ENTRY (see longitudinal plan of care for additional care plan information)  Current Barriers:  . Housing barriers  Clinical Social Work Clinical Goal(s):  Marland Kitchen Over the next 90 days, patient will work with [employer] to address needs related to Housing  Interventions: . Inter-disciplinary care team collaboration (see longitudinal plan of care) . Patient interviewed and appropriate assessments performed . Patient states that they have gone up on her rent and due to this has to be move by Thursday . Patient will be moving in with her son and his wife until she can find a place of her own . Provided patient with information about [place] for possible housing resources-which was contacted-however they were not able to help because she is not "actively" homeless or being evicted . Patient confirmed that she has made efforts to contact the [employer] and [employer] . Provided patient with contact information for [facility] [phone] and advised patient to contact them for follow up . Discussed plans with patient for ongoing care management follow up and provided patient with direct contact information for care management team   Patient Self Care Activities:  . Performs ADL's independently . Calls provider office for new concerns or questions . Knowledge deficit related to Housing Needs  Initial goal documentation         The patient verbalized understanding of  instructions provided today and declined a print copy of patient instruction materials.   Telephone follow up appointment with care management team member scheduled for: 05/11/20

## 2020-05-03 NOTE — Chronic Care Management (AMB) (Signed)
Clinical Care Management    Clinical Social Work Follow Up Note  05/03/2020 Name: Teresa [name] MRN: [id number] DOB: Jun 10, 1946  Teresa [name] is a 74 y.o. year old female who is a primary care patient of [name], MD. The CCM team was consulted for assistance with [employer] .   Review of patient status, including review of consultants reports, other relevant assessments, and collaboration with appropriate care team members and the patient's provider was performed as part of comprehensive patient evaluation and provision of chronic care management services.    SDOH (Social Determinants of Health) assessments performed: No    Outpatient Encounter Medications as of 05/03/2020  Medication Sig Note  . acetaminophen (TYLENOL) 500 MG tablet Take 500 mg by mouth every 6 (six) hours as needed for mild pain or headache.   . allopurinol (ZYLOPRIM) 100 MG tablet TAKE 1 TABLET BY MOUTH EVERY DAY 05/17/2019: Takes at bedtime  . atorvastatin (LIPITOR) 10 MG tablet TAKE 1 TABLET BY MOUTH EVERY DAY AT BEDTIME   . benzonatate (TESSALON) 200 MG capsule Take 1 capsule (200 mg total) by mouth 3 (three) times daily. (Patient not taking: Reported on 12/05/2019)   . budesonide (PULMICORT) 0.5 MG/2ML nebulizer solution Inhale into the lungs.   . colchicine 0.6 MG tablet Take 0.6 mg by mouth daily as needed (for gout flare-ups).    . Continuous Blood Gluc Receiver (FREESTYLE LIBRE 2 READER) DEVI 1 each by Does not apply route continuous.   . Continuous Blood Gluc Sensor (FREESTYLE LIBRE 2 SENSOR) MISC USE EVERY 14 DAYS AS DIRECTED   . furosemide (LASIX) 40 MG tablet TAKE 1 TABLET BY MOUTH EVERY DAY   . gabapentin (NEURONTIN) 600 MG tablet Take 1 tablet (600 mg total) by mouth 3 (three) times daily.   Marland Kitchen glimepiride (AMARYL) 4 MG tablet TAKE 1 TABLET (4 MG TOTAL) BY MOUTH 2 (TWO) TIMES DAILY.   Marland Kitchen guaiFENesin-dextromethorphan (ROBITUSSIN DM) 100-10 MG/5ML syrup Take 5 mLs by mouth every 4  (four) hours as needed for cough. (Patient not taking: Reported on 12/05/2019)   . JARDIANCE 25 MG TABS tablet TAKE 25 MG BY MOUTH DAILY.   Marland Kitchen levothyroxine (SYNTHROID) 88 MCG tablet TAKE 1 TABLET BY MOUTH EVERY DAY   . losartan (COZAAR) 25 MG tablet Take 1 tablet (25 mg total) by mouth daily.   Marland Kitchen menthol-cetylpyridinium (CEPACOL) 3 MG lozenge Take 1 lozenge (3 mg total) by mouth as needed for sore throat. (Patient not taking: Reported on 12/05/2019)   . metFORMIN (GLUCOPHAGE) 1000 MG tablet TAKE 1 TABLET BY MOUTH TWICE A DAY   . nystatin cream (MYCOSTATIN) Apply 1 application topically 2 (two) times daily. (Patient taking differently: Apply 1 application topically 2 (two) times daily as needed (for diabetic yeast reactions.). )   . ondansetron (ZOFRAN) 4 MG tablet TAKE 1 TABLET BY MOUTH EVERY 8 HOURS AS NEEDED FOR NAUSEA AND VOMITING   . OZEMPIC, 0.25 OR 0.5 MG/DOSE, 2 MG/1.5ML SOPN INJECT 0.5MG  INTO THE SKIN ONCE A WEEK.   . pioglitazone (ACTOS) 30 MG tablet TAKE 1 TABLET BY MOUTH EVERY DAY    No facility-administered encounter medications on file as of 05/03/2020.     Goals Addressed              This Visit's Progress   .  "I need to move out of my home by Thursday" (pt-stated)        CARE PLAN ENTRY (see longitudinal plan of care  for additional care plan information)  Current Barriers:  . Housing barriers  Clinical Social Work Clinical Goal(s):  Marland Kitchen Over the next 90 days, patient will work with [employer] to address needs related to Housing  Interventions: . Inter-disciplinary care team collaboration (see longitudinal plan of care) . Patient interviewed and appropriate assessments performed . Patient states that they have gone up on her rent and due to this has to be move by Thursday . Patient will be moving in with her son and his wife until she can find a place of her own . Provided patient with information about [place] for possible housing resources-which was  contacted-however they were not able to help because she is not "actively" homeless or being evicted . Patient confirmed that she has made efforts to contact the [employer] and [employer] . Provided patient with contact information for [facility] [phone] and advised patient to contact them for follow up . Discussed plans with patient for ongoing care management follow up and provided patient with direct contact information for care management team   Patient Self Care Activities:  . Performs ADL's independently . Calls provider office for new concerns or questions . Knowledge deficit related to Housing Needs  Initial goal documentation         Follow Up Plan: SW will follow up with patient by phone over the next 7-14 business days    [place], [place] Worker  [place] Management [phone]

## 2020-05-07 ENCOUNTER — Telehealth: Payer: Self-pay

## 2020-05-07 ENCOUNTER — Ambulatory Visit: Payer: Self-pay | Admitting: *Deleted

## 2020-05-07 ENCOUNTER — Telehealth: Payer: Self-pay | Admitting: Family Medicine

## 2020-05-07 DIAGNOSIS — I10 Essential (primary) hypertension: Secondary | ICD-10-CM

## 2020-05-07 DIAGNOSIS — E1140 Type 2 diabetes mellitus with diabetic neuropathy, unspecified: Secondary | ICD-10-CM

## 2020-05-07 NOTE — Telephone Encounter (Signed)
Patient is calling to speak someone in Social Work - Patient was talking to someone in Social Work regarding some that could help because she had Assistance with [place]. Patient spoke with Social Worker yesterday- Does not remember her name would like a call back was not able to write down the information yesterday. Please advise CB- [phone]

## 2020-05-07 NOTE — Chronic Care Management (AMB) (Addendum)
Chronic Care Management   Social Work Note  05/11/2020 Name: Teresa [name] MRN: [id number] DOB: [birth year]  Teresa [name] is a 74 y.o. year old female who sees [name], MD for primary care. The CCM team was consulted for assistance with [employer] .   Message received from [facility] to contact patient. Per patient, she was not needing to speak to this social but the pharmacist assistant-Holly. Patient did report that she has now moved in with her son and daughter in law, and has contacted [employer] for additional housing resources. Per patient, she contacted [facility], they have no vacancies at this time. SDOH (Social Determinants of Health) assessments performed: No     Outpatient Encounter Medications as of 05/07/2020  Medication Sig Note  . acetaminophen (TYLENOL) 500 MG tablet Take 500 mg by mouth every 6 (six) hours as needed for mild pain or headache.   . allopurinol (ZYLOPRIM) 100 MG tablet TAKE 1 TABLET BY MOUTH EVERY DAY 05/17/2019: Takes at bedtime  . atorvastatin (LIPITOR) 10 MG tablet TAKE 1 TABLET BY MOUTH EVERY DAY AT BEDTIME   . benzonatate (TESSALON) 200 MG capsule Take 1 capsule (200 mg total) by mouth 3 (three) times daily. (Patient not taking: Reported on 12/05/2019)   . budesonide (PULMICORT) 0.5 MG/2ML nebulizer solution Inhale into the lungs.   . colchicine 0.6 MG tablet Take 0.6 mg by mouth daily as needed (for gout flare-ups).    . Continuous Blood Gluc Receiver (FREESTYLE LIBRE 2 READER) DEVI 1 each by Does not apply route continuous.   . Continuous Blood Gluc Sensor (FREESTYLE LIBRE 2 SENSOR) MISC USE EVERY 14 DAYS AS DIRECTED   . furosemide (LASIX) 40 MG tablet TAKE 1 TABLET BY MOUTH EVERY DAY   . gabapentin (NEURONTIN) 600 MG tablet Take 1 tablet (600 mg total) by mouth 3 (three) times daily.   Marland Kitchen glimepiride (AMARYL) 4 MG tablet TAKE 1 TABLET (4 MG TOTAL) BY MOUTH 2 (TWO) TIMES DAILY.   Marland Kitchen  guaiFENesin-dextromethorphan (ROBITUSSIN DM) 100-10 MG/5ML syrup Take 5 mLs by mouth every 4 (four) hours as needed for cough. (Patient not taking: Reported on 12/05/2019)   . JARDIANCE 25 MG TABS tablet TAKE 25 MG BY MOUTH DAILY.   Marland Kitchen levothyroxine (SYNTHROID) 88 MCG tablet TAKE 1 TABLET BY MOUTH EVERY DAY   . losartan (COZAAR) 25 MG tablet Take 1 tablet (25 mg total) by mouth daily.   Marland Kitchen menthol-cetylpyridinium (CEPACOL) 3 MG lozenge Take 1 lozenge (3 mg total) by mouth as needed for sore throat. (Patient not taking: Reported on 12/05/2019)   . metFORMIN (GLUCOPHAGE) 1000 MG tablet TAKE 1 TABLET BY MOUTH TWICE A DAY   . nystatin cream (MYCOSTATIN) Apply 1 application topically 2 (two) times daily. (Patient taking differently: Apply 1 application topically 2 (two) times daily as needed (for diabetic yeast reactions.). )   . ondansetron (ZOFRAN) 4 MG tablet TAKE 1 TABLET BY MOUTH EVERY 8 HOURS AS NEEDED FOR NAUSEA AND VOMITING   . OZEMPIC, 0.25 OR 0.5 MG/DOSE, 2 MG/1.5ML SOPN INJECT 0.5MG  INTO THE SKIN ONCE A WEEK.   . pioglitazone (ACTOS) 30 MG tablet TAKE 1 TABLET BY MOUTH EVERY DAY    No facility-administered encounter medications on file as of 05/07/2020.    Goals Addressed              This Visit's Progress   .  "I need to move out of my home by Thursday" (pt-stated)  CARE PLAN ENTRY (see longitudinal plan of care for additional care plan information)  Current Barriers:  . Housing barriers  Clinical Social Work Clinical Goal(s):  Marland Kitchen Over the next 90 days, patient will work with [employer] to address needs related to Housing  Interventions: . Inter-disciplinary care team collaboration (see longitudinal plan of care) . Patient interviewed and appropriate assessments performed . Patient states that she has now moved in with her son and daughter in law . Patient has contacted [facility] for possible housing resources-which was contacted-however they were not able to help  because she is not "actively" homeless or being evicted . Patient confirmed that she has  contacted  [facility] [phone] however they have no vacancies at this time . Patient to continue to reside with her son and daughter in law until permanent housing is identified . This social worker to continue to follow up with patient to assist with housing needs . Discussed plans with patient for ongoing care management follow up and provided patient with direct contact information for care management team   Patient Self Care Activities:  . Performs ADL's independently . Calls provider office for new concerns or questions . Knowledge deficit related to Housing Needs  Please see past updates related to this goal by clicking on the "Past Updates" button in the selected goal         Follow Up Plan: Appointment scheduled for SW follow up with client by phone on: 05/11/20    Elliot Gurney, [place] Worker  [facility] Care Management [phone]

## 2020-05-07 NOTE — Progress Notes (Signed)
  Patient recently discussed long term Coronavirus issues with PharmD making issues regarding living very difficult. Pt had expressed that affordability and adherence of medication was not a top priority as she faced eviction. HOPE program discussed with patient with instructions given on how to apply at [email or web address]   Her son has ensured she has her medications for this month as she figures out what is going on. She is temporarily staying with her son and his family. She has not been able to regularly check her BP or BG with moving.   Recent Office Vitals: BP Readings from Last 3 Encounters:  01/19/20 100/68  09/18/19 116/71  07/15/19 104/64   Pulse Readings from Last 3 Encounters:  01/19/20 69  09/18/19 (!) 59  07/15/19 73    Wt Readings from Last 3 Encounters:  01/19/20 202 lb 12.8 oz (92 kg)  09/18/19 200 lb (90.7 kg)  07/15/19 194 lb (88 kg)     Kidney Function Lab Results  Component Value Date/Time   CREATININE 0.67 09/18/2019 11:21 AM   CREATININE 0.78 06/24/2019 02:30 PM   CREATININE 0.73 05/14/2014 09:56 AM   CREATININE 0.68 04/04/2013 10:47 AM   GFRNONAA 87 09/18/2019 11:21 AM   GFRNONAA >60 05/14/2014 09:56 AM   GFRNONAA >60 04/04/2013 10:47 AM   GFRAA 100 09/18/2019 11:21 AM   GFRAA >60 05/14/2014 09:56 AM   GFRAA >60 04/04/2013 10:47 AM    BMP Latest Ref Rng & Units 09/18/2019 06/24/2019 05/27/2019  Glucose 65 - 99 mg/dL 126(H) 186(H) 67(L)  BUN 8 - 27 mg/dL 13 15 35(H)  Creatinine 0.57 - 1.00 mg/dL 0.67 0.78 0.56  BUN/Creat Ratio 12 - 28 19 19  -  Sodium 134 - 144 mmol/L 144 138 135  Potassium 3.5 - 5.2 mmol/L 3.7 4.1 4.0  Chloride 96 - 106 mmol/L 106 97 92(L)  CO2 20 - 29 mmol/L 17(L) 18(L) 34(H)  Calcium 8.7 - 10.3 mg/dL 9.3 10.0 8.4(L)   .Marland Kitchen Recent Relevant Labs: Lab Results  Component Value Date/Time   HGBA1C 7.4 (A) 01/19/2020 10:29 AM   HGBA1C 7.4 (H) 09/18/2019 11:21 AM   HGBA1C 8.4 (H) 05/17/2019 03:05 AM   HGBA1C 7.6 (H) 04/04/2013 10:47 AM    MICROALBUR 50 06/06/2017 11:26 AM   MICROALBUR 20 12/08/2015 10:39 AM    Kidney Function Lab Results  Component Value Date/Time   CREATININE 0.67 09/18/2019 11:21 AM   CREATININE 0.78 06/24/2019 02:30 PM   CREATININE 0.73 05/14/2014 09:56 AM   CREATININE 0.68 04/04/2013 10:47 AM   GFRNONAA 87 09/18/2019 11:21 AM   GFRNONAA >60 05/14/2014 09:56 AM   GFRNONAA >60 04/04/2013 10:47 AM   GFRAA 100 09/18/2019 11:21 AM   GFRAA >60 05/14/2014 09:56 AM   GFRAA >60 04/04/2013 10:47 AM

## 2020-05-11 ENCOUNTER — Ambulatory Visit: Payer: Self-pay | Admitting: *Deleted

## 2020-05-11 DIAGNOSIS — I10 Essential (primary) hypertension: Secondary | ICD-10-CM

## 2020-05-11 DIAGNOSIS — E1140 Type 2 diabetes mellitus with diabetic neuropathy, unspecified: Secondary | ICD-10-CM

## 2020-05-11 NOTE — Patient Instructions (Signed)
Thank you allowing the Chronic Care Management Team to be a part of your care! It was a pleasure speaking with you today!  1. Please expect a list of Housing Resources for your review-list to be mailed to [address] [place] [address]  CCM (Chronic Care Management) Team   Babs Sciara RN, BSN Nurse Care Coordinator  [phone]  [place], LCSW Clinical Social Worker [phone]  Goals Addressed              This Visit's Progress     "I need to move out of my home by Thursday" (pt-stated)        [place] (see longitudinal plan of care for additional care plan information)  Current Barriers:   Housing barriers  Clinical Social Work Clinical Goal(s):   Over the next 90 days, patient will work with [employer] to address needs related to Housing  Interventions:  Inter-disciplinary care team collaboration (see longitudinal plan of care)  Patient interviewed and appropriate assessments performed  Patient continues to confirm that he has now moved in with her son and daughter in law and will remain there until permanent housing is identified  Patient now number 1 on the waiting list for [place]  This social worker to send patient a list of resources for [place]  This social worker to continue to follow up with patient to assist with housing needs  Discussed plans with patient for ongoing care management follow up and provided patient with direct contact information for care management team   Patient Self Care Activities:   Performs ADL's independently  Calls provider office for new concerns or questions  Knowledge deficit related to Housing Needs  Please see past updates related to this goal by clicking on the "Past Updates" button in the selected goal          The patient verbalized understanding of instructions provided today and declined a print copy of patient instruction materials.   Telephone  follow up appointment with care management team member scheduled for: 05/25/20

## 2020-05-11 NOTE — Patient Instructions (Signed)
Thank you allowing the Chronic Care Management Team to be a part of your care! It was a pleasure speaking with you today!  1. Please call this social worker with any additional housing needs  CCM (Chronic Care Management) Team   Neldon Labella RN, BSN Nurse Care Coordinator  [phone]  [place], LCSW Clinical Social Worker [phone]  Goals Addressed              This Visit's Progress   .  "I need to move out of my home by Thursday" (pt-stated)        CARE PLAN ENTRY (see longitudinal plan of care for additional care plan information)  Current Barriers:  . Housing barriers  Clinical Social Work Clinical Goal(s):  Marland Kitchen Over the next 90 days, patient will work with [employer] to address needs related to Housing  Interventions: . Inter-disciplinary care team collaboration (see longitudinal plan of care) . Patient interviewed and appropriate assessments performed . Patient states that she has now moved in with her son and daughter in law . Patient has contacted [place] for possible housing resources-which was contacted-however they were not able to help because she is not "actively" homeless or being evicted . Patient confirmed that she has  contacted  [facility] [phone] however they have no vacancies at this time . Patient to continue to reside with her son and daughter in law until permanent housing is identified . This social worker to continue to follow up with patient to assist with housing needs . Discussed plans with patient for ongoing care management follow up and provided patient with direct contact information for care management team   Patient Self Care Activities:  . Performs ADL's independently . Calls provider office for new concerns or questions . Knowledge deficit related to Housing Needs  Please see past updates related to this goal by clicking on the "Past Updates" button in the selected goal          The patient  verbalized understanding of instructions provided today and declined a print copy of patient instruction materials.   Telephone follow up appointment with care management team member scheduled for:05/11/20

## 2020-05-11 NOTE — Chronic Care Management (AMB) (Signed)
Chronic Care Management    Clinical Social Work Follow Up Note  05/11/2020 Name: Teresa [name] MRN: [id number] DOB: 1945-11-13  Teresa [name] is a 74 y.o. year old female who is a primary care patient of [name], MD. The CCM team was consulted for assistance with [employer] .   Review of patient status, including review of consultants reports, other relevant assessments, and collaboration with appropriate care team members and the patient's provider was performed as part of comprehensive patient evaluation and provision of chronic care management services.    SDOH (Social Determinants of Health) assessments performed: No    Outpatient Encounter Medications as of 05/11/2020  Medication Sig Note  . acetaminophen (TYLENOL) 500 MG tablet Take 500 mg by mouth every 6 (six) hours as needed for mild pain or headache.   . allopurinol (ZYLOPRIM) 100 MG tablet TAKE 1 TABLET BY MOUTH EVERY DAY 05/17/2019: Takes at bedtime  . atorvastatin (LIPITOR) 10 MG tablet TAKE 1 TABLET BY MOUTH EVERY DAY AT BEDTIME   . benzonatate (TESSALON) 200 MG capsule Take 1 capsule (200 mg total) by mouth 3 (three) times daily. (Patient not taking: Reported on 12/05/2019)   . budesonide (PULMICORT) 0.5 MG/2ML nebulizer solution Inhale into the lungs.   . colchicine 0.6 MG tablet Take 0.6 mg by mouth daily as needed (for gout flare-ups).    . Continuous Blood Gluc Receiver (FREESTYLE LIBRE 2 READER) DEVI 1 each by Does not apply route continuous.   . Continuous Blood Gluc Sensor (FREESTYLE LIBRE 2 SENSOR) MISC USE EVERY 14 DAYS AS DIRECTED   . furosemide (LASIX) 40 MG tablet TAKE 1 TABLET BY MOUTH EVERY DAY   . gabapentin (NEURONTIN) 600 MG tablet Take 1 tablet (600 mg total) by mouth 3 (three) times daily.   Marland Kitchen glimepiride (AMARYL) 4 MG tablet TAKE 1 TABLET (4 MG TOTAL) BY MOUTH 2 (TWO) TIMES DAILY.   Marland Kitchen guaiFENesin-dextromethorphan (ROBITUSSIN DM) 100-10 MG/5ML syrup Take 5 mLs by mouth every 4 (four)  hours as needed for cough. (Patient not taking: Reported on 12/05/2019)   . JARDIANCE 25 MG TABS tablet TAKE 25 MG BY MOUTH DAILY.   Marland Kitchen levothyroxine (SYNTHROID) 88 MCG tablet TAKE 1 TABLET BY MOUTH EVERY DAY   . losartan (COZAAR) 25 MG tablet Take 1 tablet (25 mg total) by mouth daily.   Marland Kitchen menthol-cetylpyridinium (CEPACOL) 3 MG lozenge Take 1 lozenge (3 mg total) by mouth as needed for sore throat. (Patient not taking: Reported on 12/05/2019)   . metFORMIN (GLUCOPHAGE) 1000 MG tablet TAKE 1 TABLET BY MOUTH TWICE A DAY   . nystatin cream (MYCOSTATIN) Apply 1 application topically 2 (two) times daily. (Patient taking differently: Apply 1 application topically 2 (two) times daily as needed (for diabetic yeast reactions.). )   . ondansetron (ZOFRAN) 4 MG tablet TAKE 1 TABLET BY MOUTH EVERY 8 HOURS AS NEEDED FOR NAUSEA AND VOMITING   . OZEMPIC, 0.25 OR 0.5 MG/DOSE, 2 MG/1.5ML SOPN INJECT 0.5MG  INTO THE SKIN ONCE A WEEK.   . pioglitazone (ACTOS) 30 MG tablet TAKE 1 TABLET BY MOUTH EVERY DAY    No facility-administered encounter medications on file as of 05/11/2020.     Goals Addressed              This Visit's Progress   .  "I need to move out of my home by Thursday" (pt-stated)        CARE PLAN ENTRY (see longitudinal plan of care for  additional care plan information)  Current Barriers:  . Housing barriers  Clinical Social Work Clinical Goal(s):  Marland Kitchen Over the next 90 days, patient will work with [employer] to address needs related to Housing  Interventions: . Inter-disciplinary care team collaboration (see longitudinal plan of care) . Patient interviewed and appropriate assessments performed . Patient continues to confirm that he has now moved in with her son and daughter in law and will remain there until permanent housing is identified . Patient now number 1 on the waiting list for [facility] . This Education officer, museum to send patient a list of resources for  [employer] . This social worker to continue to follow up with patient to assist with housing needs . Discussed plans with patient for ongoing care management follow up and provided patient with direct contact information for care management team   Patient Self Care Activities:  . Performs ADL's independently . Calls provider office for new concerns or questions . Knowledge deficit related to Housing Needs  Please see past updates related to this goal by clicking on the "Past Updates" button in the selected goal          Follow Up Plan: SW will follow up with patient by phone over the next 7-14 business days    [place], [place] Worker  [facility] Care Management [phone]

## 2020-05-16 DIAGNOSIS — J431 Panlobular emphysema: Secondary | ICD-10-CM | POA: Diagnosis not present

## 2020-05-19 ENCOUNTER — Ambulatory Visit: Payer: Self-pay | Admitting: *Deleted

## 2020-05-19 DIAGNOSIS — I10 Essential (primary) hypertension: Secondary | ICD-10-CM

## 2020-05-19 DIAGNOSIS — E1140 Type 2 diabetes mellitus with diabetic neuropathy, unspecified: Secondary | ICD-10-CM

## 2020-05-19 NOTE — Chronic Care Management (AMB) (Signed)
  Chronic Care Management   Social Work Note  05/19/2020 Name: ROCHELL [name] MRN: [id number] DOB: 1946-02-14  Teresa [name] is a 74 y.o. year old female who sees [name], MD for primary care. The CCM team was consulted for assistance with [employer] .   List of housing resources(rentalcompanies, independent living, and low income apartments mailed to patient's home for review and use when needed.  SDOH (Social Determinants of Health) assessments performed: No     Outpatient Encounter Medications as of 05/19/2020  Medication Sig Note  . acetaminophen (TYLENOL) 500 MG tablet Take 500 mg by mouth every 6 (six) hours as needed for mild pain or headache.   . allopurinol (ZYLOPRIM) 100 MG tablet TAKE 1 TABLET BY MOUTH EVERY DAY 05/17/2019: Takes at bedtime  . atorvastatin (LIPITOR) 10 MG tablet TAKE 1 TABLET BY MOUTH EVERY DAY AT BEDTIME   . benzonatate (TESSALON) 200 MG capsule Take 1 capsule (200 mg total) by mouth 3 (three) times daily. (Patient not taking: Reported on 12/05/2019)   . budesonide (PULMICORT) 0.5 MG/2ML nebulizer solution Inhale into the lungs.   . colchicine 0.6 MG tablet Take 0.6 mg by mouth daily as needed (for gout flare-ups).    . Continuous Blood Gluc Receiver (FREESTYLE LIBRE 2 READER) DEVI 1 each by Does not apply route continuous.   . Continuous Blood Gluc Sensor (FREESTYLE LIBRE 2 SENSOR) MISC USE EVERY 14 DAYS AS DIRECTED   . furosemide (LASIX) 40 MG tablet TAKE 1 TABLET BY MOUTH EVERY DAY   . gabapentin (NEURONTIN) 600 MG tablet Take 1 tablet (600 mg total) by mouth 3 (three) times daily.   Marland Kitchen glimepiride (AMARYL) 4 MG tablet TAKE 1 TABLET (4 MG TOTAL) BY MOUTH 2 (TWO) TIMES DAILY.   Marland Kitchen guaiFENesin-dextromethorphan (ROBITUSSIN DM) 100-10 MG/5ML syrup Take 5 mLs by mouth every 4 (four) hours as needed for cough. (Patient not taking: Reported on 12/05/2019)   . JARDIANCE 25 MG TABS tablet TAKE 25 MG BY MOUTH DAILY.   Marland Kitchen levothyroxine (SYNTHROID)  88 MCG tablet TAKE 1 TABLET BY MOUTH EVERY DAY   . losartan (COZAAR) 25 MG tablet Take 1 tablet (25 mg total) by mouth daily.   Marland Kitchen menthol-cetylpyridinium (CEPACOL) 3 MG lozenge Take 1 lozenge (3 mg total) by mouth as needed for sore throat. (Patient not taking: Reported on 12/05/2019)   . metFORMIN (GLUCOPHAGE) 1000 MG tablet TAKE 1 TABLET BY MOUTH TWICE A DAY   . nystatin cream (MYCOSTATIN) Apply 1 application topically 2 (two) times daily. (Patient taking differently: Apply 1 application topically 2 (two) times daily as needed (for diabetic yeast reactions.). )   . ondansetron (ZOFRAN) 4 MG tablet TAKE 1 TABLET BY MOUTH EVERY 8 HOURS AS NEEDED FOR NAUSEA AND VOMITING   . OZEMPIC, 0.25 OR 0.5 MG/DOSE, 2 MG/1.5ML SOPN INJECT 0.5MG  INTO THE SKIN ONCE A WEEK.   . pioglitazone (ACTOS) 30 MG tablet TAKE 1 TABLET BY MOUTH EVERY DAY    No facility-administered encounter medications on file as of 05/19/2020.    Goals Addressed   None     Follow Up Plan: SW will follow up with patient by phone over the next 7-10 business days   [place], [place] Worker  [facility] Care Management [phone]

## 2020-05-25 ENCOUNTER — Ambulatory Visit (INDEPENDENT_AMBULATORY_CARE_PROVIDER_SITE_OTHER): Payer: PPO | Admitting: *Deleted

## 2020-05-25 ENCOUNTER — Ambulatory Visit: Payer: PPO | Admitting: Family Medicine

## 2020-05-25 DIAGNOSIS — I10 Essential (primary) hypertension: Secondary | ICD-10-CM | POA: Diagnosis not present

## 2020-05-25 DIAGNOSIS — E1140 Type 2 diabetes mellitus with diabetic neuropathy, unspecified: Secondary | ICD-10-CM

## 2020-05-25 NOTE — Patient Instructions (Signed)
Thank you allowing the Chronic Care Management Team to be a part of your care! It was a pleasure speaking with you today!  1. Please call this social worker with any questions or concerns regarding your community resource needs.  CCM (Chronic Care Management) Team   Neldon Labella RN, BSN Nurse Care Coordinator  [phone]  [place], LCSW Clinical Social Worker [phone]  Goals Addressed              This Visit's Progress   .  "I need to move out of my home by Thursday" completed (pt-stated)        CARE PLAN ENTRY (see longitudinal plan of care for additional care plan information)  Current Barriers:  . Housing barriers  Clinical Social Work Clinical Goal(s):  Marland Kitchen Over the next 90 days, patient will work with [employer] to address needs related to Housing  Interventions: . Inter-disciplinary care team collaboration (see longitudinal plan of care) . Patient interviewed and appropriate assessments performed . Patient continues to confirm that he has now moved in with her son and daughter in law and will remain there until permanent housing is identified . Patient now number 1 on the waiting list for [facility] . This Education officer, museum confirmed that additional housing resources was mailed to patient last week . Patient verbalized appreciation for referrals mailed and stated that she will remain with her son and daughter in law until permanent housing is found. . Discussed plans with patient for ongoing care management follow up and provided patient with direct contact information for care management team if needed in the future.   Patient Self Care Activities:  . Performs ADL's independently . Calls provider office for new concerns or questions . Knowledge deficit related to Housing Needs  Please see past updates related to this goal by clicking on the "Past Updates" button in the selected goal          The patient verbalized  understanding of instructions provided today and declined a print copy of patient instruction materials.   No further follow up required: client to call this social worker with any additional community resources

## 2020-05-25 NOTE — Chronic Care Management (AMB) (Signed)
Chronic Care Management    Clinical Social Work Follow Up Note  05/25/2020 Name: WHITTLEY CARANDANG MRN: [id number] DOB: 04/28/1946  RAFEEF LAU is a 74 y.o. year old female who is a primary care patient of [name], MD. The CCM team was consulted for assistance with [employer] .   Review of patient status, including review of consultants reports, other relevant assessments, and collaboration with appropriate care team members and the patient's provider was performed as part of comprehensive patient evaluation and provision of chronic care management services.    SDOH (Social Determinants of Health) assessments performed: No    Outpatient Encounter Medications as of 05/25/2020  Medication Sig Note  . acetaminophen (TYLENOL) 500 MG tablet Take 500 mg by mouth every 6 (six) hours as needed for mild pain or headache.   . allopurinol (ZYLOPRIM) 100 MG tablet TAKE 1 TABLET BY MOUTH EVERY DAY 05/17/2019: Takes at bedtime  . atorvastatin (LIPITOR) 10 MG tablet TAKE 1 TABLET BY MOUTH EVERY DAY AT BEDTIME   . benzonatate (TESSALON) 200 MG capsule Take 1 capsule (200 mg total) by mouth 3 (three) times daily. (Patient not taking: Reported on 12/05/2019)   . budesonide (PULMICORT) 0.5 MG/2ML nebulizer solution Inhale into the lungs.   . colchicine 0.6 MG tablet Take 0.6 mg by mouth daily as needed (for gout flare-ups).    . Continuous Blood Gluc Receiver (FREESTYLE LIBRE 2 READER) DEVI 1 each by Does not apply route continuous.   . Continuous Blood Gluc Sensor (FREESTYLE LIBRE 2 SENSOR) MISC USE EVERY 14 DAYS AS DIRECTED   . furosemide (LASIX) 40 MG tablet TAKE 1 TABLET BY MOUTH EVERY DAY   . gabapentin (NEURONTIN) 600 MG tablet Take 1 tablet (600 mg total) by mouth 3 (three) times daily.   Marland Kitchen glimepiride (AMARYL) 4 MG tablet TAKE 1 TABLET (4 MG TOTAL) BY MOUTH 2 (TWO) TIMES DAILY.   Marland Kitchen guaiFENesin-dextromethorphan (ROBITUSSIN DM) 100-10 MG/5ML syrup Take 5 mLs by mouth every 4  (four) hours as needed for cough. (Patient not taking: Reported on 12/05/2019)   . JARDIANCE 25 MG TABS tablet TAKE 25 MG BY MOUTH DAILY.   Marland Kitchen levothyroxine (SYNTHROID) 88 MCG tablet TAKE 1 TABLET BY MOUTH EVERY DAY   . losartan (COZAAR) 25 MG tablet Take 1 tablet (25 mg total) by mouth daily.   Marland Kitchen menthol-cetylpyridinium (CEPACOL) 3 MG lozenge Take 1 lozenge (3 mg total) by mouth as needed for sore throat. (Patient not taking: Reported on 12/05/2019)   . metFORMIN (GLUCOPHAGE) 1000 MG tablet TAKE 1 TABLET BY MOUTH TWICE A DAY   . nystatin cream (MYCOSTATIN) Apply 1 application topically 2 (two) times daily. (Patient taking differently: Apply 1 application topically 2 (two) times daily as needed (for diabetic yeast reactions.). )   . ondansetron (ZOFRAN) 4 MG tablet TAKE 1 TABLET BY MOUTH EVERY 8 HOURS AS NEEDED FOR NAUSEA AND VOMITING   . OZEMPIC, 0.25 OR 0.5 MG/DOSE, 2 MG/1.5ML SOPN INJECT 0.5MG  INTO THE SKIN ONCE A WEEK.   . pioglitazone (ACTOS) 30 MG tablet TAKE 1 TABLET BY MOUTH EVERY DAY    No facility-administered encounter medications on file as of 05/25/2020.     Goals Addressed              This Visit's Progress   .  "I need to move out of my home by Thursday" completed (pt-stated)        CARE PLAN ENTRY (see longitudinal plan of care  for additional care plan information)  Current Barriers:  . Housing barriers  Clinical Social Work Clinical Goal(s):  Marland Kitchen Over the next 90 days, patient will work with [employer] to address needs related to Housing  Interventions: . Inter-disciplinary care team collaboration (see longitudinal plan of care) . Patient interviewed and appropriate assessments performed . Patient continues to confirm that he has now moved in with her son and daughter in law and will remain there until permanent housing is identified . Patient now number 1 on the waiting list for [facility] . This Education officer, museum confirmed that additional  housing resources was mailed to patient last week . Patient verbalized appreciation for referrals mailed and stated that she will remain with her son and daughter in law until permanent housing is found. . Discussed plans with patient for ongoing care management follow up and provided patient with direct contact information for care management team if needed in the future.   Patient Self Care Activities:  . Performs ADL's independently . Calls provider office for new concerns or questions . Knowledge deficit related to Housing Needs  Please see past updates related to this goal by clicking on the "Past Updates" button in the selected goal          Follow Up Plan: Client will call this social worker with any additonal community resource needs    [place], [place] Worker  [facility] Care Management [phone]

## 2020-05-28 ENCOUNTER — Ambulatory Visit: Payer: Self-pay | Admitting: Pharmacist

## 2020-05-28 ENCOUNTER — Other Ambulatory Visit: Payer: Self-pay

## 2020-06-01 ENCOUNTER — Other Ambulatory Visit: Payer: Self-pay

## 2020-06-01 ENCOUNTER — Encounter: Payer: Self-pay | Admitting: Family Medicine

## 2020-06-01 ENCOUNTER — Ambulatory Visit (INDEPENDENT_AMBULATORY_CARE_PROVIDER_SITE_OTHER): Payer: PPO | Admitting: Family Medicine

## 2020-06-01 VITALS — BP 97/68 | HR 67 | Temp 98.1°F | Resp 18 | Ht 62.0 in | Wt 197.0 lb

## 2020-06-01 DIAGNOSIS — E039 Hypothyroidism, unspecified: Secondary | ICD-10-CM | POA: Diagnosis not present

## 2020-06-01 DIAGNOSIS — N200 Calculus of kidney: Secondary | ICD-10-CM | POA: Diagnosis not present

## 2020-06-01 DIAGNOSIS — Z8616 Personal history of COVID-19: Secondary | ICD-10-CM | POA: Diagnosis not present

## 2020-06-01 DIAGNOSIS — E1140 Type 2 diabetes mellitus with diabetic neuropathy, unspecified: Secondary | ICD-10-CM | POA: Diagnosis not present

## 2020-06-01 DIAGNOSIS — Z23 Encounter for immunization: Secondary | ICD-10-CM | POA: Diagnosis not present

## 2020-06-01 LAB — POCT GLYCOSYLATED HEMOGLOBIN (HGB A1C)
Est. average glucose Bld gHb Est-mCnc: 151
Hemoglobin A1C: 6.9 % — AB (ref 4.0–5.6)

## 2020-06-01 NOTE — Patient Instructions (Signed)
Stop Losartan   

## 2020-06-01 NOTE — Progress Notes (Signed)
I,Mitsuru Dault,acting as a scribe for Wilhemena Durie, MD.,have documented all relevant documentation on the behalf of Wilhemena Durie, MD,as directed by  Wilhemena Durie, MD while in the presence of Wilhemena Durie, MD.   Established patient visit   Patient: Teresa [name]   DOB: 1945/10/04   74 y.o. Female  MRN: [id number] Visit Date: 06/01/2020  Today's healthcare provider: Wilhemena Durie, MD   Chief Complaint  Patient presents with   Follow-up   Hypertension   Subjective    HPI  Patient still very weak from her Covid infection a year ago. Diabetes Mellitus Type II, follow-up  Lab Results  Component Value Date   HGBA1C 7.4 (A) 01/19/2020   HGBA1C 7.4 (H) 09/18/2019   HGBA1C 8.4 (H) 05/17/2019   Last seen for diabetes 4 months ago.  Management since then includes; No changes in her care at this time.  She knows to work on her dietary changes. She reports good compliance with treatment. She is not having side effects. none  Home blood sugar records: fasting range: 105  Episodes of hypoglycemia? No none   Current insulin regiment: n/a Most Recent Eye Exam: 07/07/2019  --------------------------------------------------------------------  Hypertension, follow-up  BP Readings from Last 3 Encounters:  06/01/20 97/68  01/19/20 100/68  09/18/19 116/71   Wt Readings from Last 3 Encounters:  06/01/20 197 lb (89.4 kg)  01/19/20 202 lb 12.8 oz (92 kg)  09/18/19 200 lb (90.7 kg)     She was last seen for hypertension 4 months ago.  BP at that visit was 100/68. Management since that visit includes; on losartan. She reports good compliance with treatment. She is not having side effects. none She is not exercising. She is adherent to low salt diet.   Outside blood pressures are not checking.  She does not smoke.  Use of agents associated with hypertension: none.    --------------------------------------------------------------------   Polyneuropathy associated with underlying disease ([place]) From 01/19/2020-This is persistently bothering her at night.  Increase her gabapentin to 600 mg in the morning and 1200 at bedtime.  Gastroenteritis and colitis, viral From 01/19/2020-Clinically this is resolving.  Her abdomen is soft.  No further intervention.  Pneumonia due to COVID-19 virus From 01/19/2020-Hopefully she will be slowly improving from Covid.  She also has follow-up with pulmonology.  Past Medical History:  Diagnosis Date   Complication of anesthesia    Depression    Diabetes mellitus without complication (HCC)    Dyspnea    DOE   Edema    FEET/LEGS   GERD (gastroesophageal reflux disease)    Gout    Headache    Heart murmur    History of hiatal hernia    History of orthopnea    Hypertension    Hypothyroidism    PONV (postoperative nausea and vomiting)    Scleroderma (HCC)    hands   Sleep apnea    NO CPAP   Thyroid disease    Vertigo        Medications: Outpatient Medications Prior to Visit  Medication Sig   acetaminophen (TYLENOL) 500 MG tablet Take 500 mg by mouth every 6 (six) hours as needed for mild pain or headache.   allopurinol (ZYLOPRIM) 100 MG tablet TAKE 1 TABLET BY MOUTH EVERY DAY   atorvastatin (LIPITOR) 10 MG tablet TAKE 1 TABLET BY MOUTH EVERY DAY AT BEDTIME   budesonide (PULMICORT) 0.5 MG/2ML nebulizer solution Inhale into the lungs.   colchicine 0.6  MG tablet Take 0.6 mg by mouth daily as needed (for gout flare-ups).    Continuous Blood Gluc Receiver (FREESTYLE LIBRE 2 READER) DEVI 1 each by Does not apply route continuous.   Continuous Blood Gluc Sensor (FREESTYLE LIBRE 2 SENSOR) MISC USE EVERY 14 DAYS AS DIRECTED   furosemide (LASIX) 40 MG tablet TAKE 1 TABLET BY MOUTH EVERY DAY   gabapentin (NEURONTIN) 600 MG tablet Take 1 tablet (600 mg total) by mouth 3 (three) times daily.    glimepiride (AMARYL) 4 MG tablet TAKE 1 TABLET (4 MG TOTAL) BY MOUTH 2 (TWO) TIMES DAILY.   JARDIANCE 25 MG TABS tablet TAKE 25 MG BY MOUTH DAILY.   levothyroxine (SYNTHROID) 88 MCG tablet TAKE 1 TABLET BY MOUTH EVERY DAY   losartan (COZAAR) 25 MG tablet Take 1 tablet (25 mg total) by mouth daily.   metFORMIN (GLUCOPHAGE) 1000 MG tablet TAKE 1 TABLET BY MOUTH TWICE A DAY   nystatin cream (MYCOSTATIN) Apply 1 application topically 2 (two) times daily. (Patient taking differently: Apply 1 application topically 2 (two) times daily as needed (for diabetic yeast reactions.). )   ondansetron (ZOFRAN) 4 MG tablet TAKE 1 TABLET BY MOUTH EVERY 8 HOURS AS NEEDED FOR NAUSEA AND VOMITING   OZEMPIC, 0.25 OR 0.5 MG/DOSE, 2 MG/1.5ML SOPN INJECT 0.5MG  INTO THE SKIN ONCE A WEEK.   pioglitazone (ACTOS) 30 MG tablet TAKE 1 TABLET BY MOUTH EVERY DAY   benzonatate (TESSALON) 200 MG capsule Take 1 capsule (200 mg total) by mouth 3 (three) times daily. (Patient not taking: Reported on 12/05/2019)   guaiFENesin-dextromethorphan (ROBITUSSIN DM) 100-10 MG/5ML syrup Take 5 mLs by mouth every 4 (four) hours as needed for cough. (Patient not taking: Reported on 12/05/2019)   menthol-cetylpyridinium (CEPACOL) 3 MG lozenge Take 1 lozenge (3 mg total) by mouth as needed for sore throat. (Patient not taking: Reported on 12/05/2019)   No facility-administered medications prior to visit.    Review of Systems  Constitutional: Negative for appetite change, chills, fatigue and fever.  Respiratory: Negative for chest tightness and shortness of breath.   Cardiovascular: Negative for chest pain and palpitations.  Gastrointestinal: Negative for abdominal pain, nausea and vomiting.  Neurological: Negative for dizziness and weakness.    Last hemoglobin A1c Lab Results  Component Value Date   HGBA1C 7.4 (A) 01/19/2020      Objective    BP 97/68 (BP Location: Right Arm, Patient Position: Sitting, Cuff Size: Large)     Pulse 67    Temp 98.1 F (36.7 C) (Oral)    Resp 18    Ht 5\' 2"  (1.575 m)    Wt 197 lb (89.4 kg)    SpO2 98%    BMI 36.03 kg/m  BP Readings from Last 3 Encounters:  06/01/20 97/68  01/19/20 100/68  09/18/19 116/71   Wt Readings from Last 3 Encounters:  06/01/20 197 lb (89.4 kg)  01/19/20 202 lb 12.8 oz (92 kg)  09/18/19 200 lb (90.7 kg)      Physical Exam Vitals reviewed.  Constitutional:      Appearance: She is obese.  HENT:     Head: Normocephalic and atraumatic.     Right Ear: External ear normal.     Left Ear: External ear normal.  Eyes:     General: No scleral icterus.    Conjunctiva/sclera: Conjunctivae normal.  Cardiovascular:     Rate and Rhythm: Normal rate and regular rhythm.     Heart sounds: Normal heart sounds.  Pulmonary:     Breath sounds: Normal breath sounds.  Abdominal:     General: Bowel sounds are normal. There is no distension.     Palpations: Abdomen is soft.     Tenderness: There is no abdominal tenderness. There is no guarding.  Musculoskeletal:     Right lower leg: No edema.     Left lower leg: No edema.  Skin:    General: Skin is warm and dry.  Neurological:     General: No focal deficit present.     Mental Status: She is alert and oriented to person, place, and time.  Psychiatric:        Mood and Affect: Mood normal.        Behavior: Behavior normal.        Thought Content: Thought content normal.        Judgment: Judgment normal.       No results found for any visits on 06/01/20.  Assessment & Plan     1. Need for influenza vaccination  - Flu Vaccine QUAD High Dose(Fluad) - SAR CoV2 Serology (COVID 19)AB(IGG)IA - TSH - Renal function panel  2. History of 2019 novel coronavirus disease (COVID-19) Still having some chronic fatigue from this. - SAR CoV2 Serology (COVID 19)AB(IGG)IA - TSH - Renal function panel  3. RENAL CALCULUS  - SAR CoV2 Serology (COVID 19)AB(IGG)IA - TSH - Renal function panel  4. Acquired  hypothyroidism  - SAR CoV2 Serology (COVID 19)AB(IGG)IA - TSH - Renal function panel  5. Type 2 diabetes mellitus with diabetic neuropathy, without long-term current use of insulin (HCC) With decreasing A1c will stop glimepiride to lower risk of ongoing hypoglycemia. - POCT glycosylated hemoglobin (Hb A1C) 6.9, down from 7.4.  Today. 6.  Hypertension Patient hypotensive today, will stop losartan.  Follow-up early 2022  No follow-ups on file.         Richard Cranford Mon, MD  [facility] [phone] (phone) [phone] (fax)  [place]

## 2020-06-01 NOTE — Chronic Care Management (AMB) (Signed)
Chronic Care Management Pharmacy  Name: Teresa [name]  MRN: [id number] DOB: Dec 25, 1945  Chief Complaint/ HPI  Teresa [name],  74 y.o. , female presents for their Follow-Up CCM visit with the clinical pharmacist via telephone due to COVID-19 Pandemic.  PCP : [name], MD  Their chronic conditions include: HTN, HLD, DM, chronic pain  Office Visits:NA  Consult Visit:NA  Medications: Outpatient Encounter Medications as of 05/28/2020  Medication Sig Note  . acetaminophen (TYLENOL) 500 MG tablet Take 500 mg by mouth every 6 (six) hours as needed for mild pain or headache.   . allopurinol (ZYLOPRIM) 100 MG tablet TAKE 1 TABLET BY MOUTH EVERY DAY 05/17/2019: Takes at bedtime  . atorvastatin (LIPITOR) 10 MG tablet TAKE 1 TABLET BY MOUTH EVERY DAY AT BEDTIME   . benzonatate (TESSALON) 200 MG capsule Take 1 capsule (200 mg total) by mouth 3 (three) times daily. (Patient not taking: Reported on 12/05/2019)   . budesonide (PULMICORT) 0.5 MG/2ML nebulizer solution Inhale into the lungs.   . colchicine 0.6 MG tablet Take 0.6 mg by mouth daily as needed (for gout flare-ups).    . Continuous Blood Gluc Receiver (FREESTYLE LIBRE 2 READER) DEVI 1 each by Does not apply route continuous.   . Continuous Blood Gluc Sensor (FREESTYLE LIBRE 2 SENSOR) MISC USE EVERY 14 DAYS AS DIRECTED   . furosemide (LASIX) 40 MG tablet TAKE 1 TABLET BY MOUTH EVERY DAY   . gabapentin (NEURONTIN) 600 MG tablet Take 1 tablet (600 mg total) by mouth 3 (three) times daily.   Marland Kitchen glimepiride (AMARYL) 4 MG tablet TAKE 1 TABLET (4 MG TOTAL) BY MOUTH 2 (TWO) TIMES DAILY.   Marland Kitchen guaiFENesin-dextromethorphan (ROBITUSSIN DM) 100-10 MG/5ML syrup Take 5 mLs by mouth every 4 (four) hours as needed for cough. (Patient not taking: Reported on 12/05/2019)   . JARDIANCE 25 MG TABS tablet TAKE 25 MG BY MOUTH DAILY.   Marland Kitchen levothyroxine (SYNTHROID) 88 MCG tablet TAKE 1 TABLET BY MOUTH EVERY DAY   . losartan (COZAAR) 25 MG tablet Take  1 tablet (25 mg total) by mouth daily.   Marland Kitchen menthol-cetylpyridinium (CEPACOL) 3 MG lozenge Take 1 lozenge (3 mg total) by mouth as needed for sore throat. (Patient not taking: Reported on 12/05/2019)   . metFORMIN (GLUCOPHAGE) 1000 MG tablet TAKE 1 TABLET BY MOUTH TWICE A DAY   . nystatin cream (MYCOSTATIN) Apply 1 application topically 2 (two) times daily. (Patient taking differently: Apply 1 application topically 2 (two) times daily as needed (for diabetic yeast reactions.). )   . ondansetron (ZOFRAN) 4 MG tablet TAKE 1 TABLET BY MOUTH EVERY 8 HOURS AS NEEDED FOR NAUSEA AND VOMITING   . OZEMPIC, 0.25 OR 0.5 MG/DOSE, 2 MG/1.5ML SOPN INJECT 0.5MG INTO THE SKIN ONCE A WEEK.   . pioglitazone (ACTOS) 30 MG tablet TAKE 1 TABLET BY MOUTH EVERY DAY    No facility-administered encounter medications on file as of 05/28/2020.     Financial Resource Strain: High Risk  . Difficulty of Paying Living Expenses: Very hard    Current Diagnosis/Assessment:  Goals Addressed              This Visit's Progress   .  Diabetes Mellitus - goal A1c < 7% (pt-stated)        CARE PLAN ENTRY (see longitudinal plan of care for additional care plan information)  Current Barriers:  . Diabetes: Type 2; complicated by chronic medical conditions including HF, pulmonary HTN, hypothyroidism Lab  Results  Component Value Date   HGBA1C 7.4 (H) 09/18/2019 .   Lab Results  Component Value Date   CREATININE 0.67 09/18/2019   CREATININE 0.78 06/24/2019   CREATININE 0.56 05/27/2019 .   Marland Kitchen No results found for: EGFR . Current antihyperglycemic regimen: Ozempic, Jardiance, metformin . Denies hyperglycemic symptoms . Current exercise: unable to walk due to neuropathy . 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 Comprehensive medication review performed, medication list  updated in electronic medical record . Inter-disciplinary care team collaboration (see longitudinal plan of care) . Increased gabapentin 464m additional when needed  Patient Self Care Activities:  . Patient will check blood glucose daily , document, and provide at future appointments . Patient will take medications as prescribed . Patient will contact provider with any episodes of hypoglycemia . Patient will increase exercise over next 90 days after neuropathic pain reduced  Initial goal documentation       Hypertension   BP goal is:  <130/80  Office blood pressures are  BP Readings from Last 3 Encounters:  06/01/20 97/68  01/19/20 100/68  09/18/19 116/71   Patient checks BP at home infrequently Patient home BP readings are ranging: NA  Patient has failed these meds in the past: NA Patient is currently controlled on the following medications:  . Losartan 233mdaily  We discussed: At goal Denies hypotension  Plan  Continue current medications   Diabetes   Recent Relevant Labs: Lab Results  Component Value Date/Time   HGBA1C 6.9 (A) 06/01/2020 12:15 PM   HGBA1C 7.4 (A) 01/19/2020 10:29 AM   HGBA1C 7.4 (H) 09/18/2019 11:21 AM   HGBA1C 8.4 (H) 05/17/2019 03:05 AM   HGBA1C 7.6 (H) 04/04/2013 10:47 AM   MICROALBUR 50 06/06/2017 11:26 AM   MICROALBUR 20 12/08/2015 10:39 AM     Checking BG: Rarely  Patient has failed these meds in past: NA Patient is currently controlled on the following medications: glimepiride 27m28mid, Jardiance 227m30mily, metformin 1000mg38m, Actos 30mg 62my, Ozzempic 0.27mg we81my  Last diabetic Foot exam:  Lab Results  Component Value Date/Time   HMDIABEYEEXA No Retinopathy 07/07/2019 12:00 AM    Last diabetic Eye exam: No results found for: HMDIABFOOTEX   We discussed: Finally at goal! Frequent hypolgycemia  Plan  Recommended d/c glimepiride  Medication Management   We discussed:  Hasn't tried 400mg ex19mgabapentin Living  with son, but DIL is "evil" Son paying for medications  Plan  Continue current medication management strategy  Follow up: 3 month phone visit   [place]anc[place]us [place], BCGP, [phone]

## 2020-06-02 LAB — RENAL FUNCTION PANEL
Albumin: 4.5 g/dL (ref 3.7–4.7)
BUN/Creatinine Ratio: 24 (ref 12–28)
BUN: 20 mg/dL (ref 8–27)
CO2: 24 mmol/L (ref 20–29)
Calcium: 9.2 mg/dL (ref 8.7–10.3)
Chloride: 98 mmol/L (ref 96–106)
Creatinine, Ser: 0.82 mg/dL (ref 0.57–1.00)
GFR calc Af Amer: 82 mL/min/{1.73_m2} (ref >59)
GFR calc non Af Amer: 71 mL/min/{1.73_m2} (ref >59)
Glucose: 124 mg/dL — ABNORMAL HIGH (ref 65–99)
Phosphorus: 3.4 mg/dL (ref 3.0–4.3)
Potassium: 4.1 mmol/L (ref 3.5–5.2)
Sodium: 141 mmol/L (ref 134–144)

## 2020-06-02 LAB — TSH: TSH: 1.46 u[IU]/mL (ref 0.450–4.500)

## 2020-06-02 LAB — SAR COV2 SEROLOGY (COVID19)AB(IGG),IA: DiaSorin SARS-CoV-2 Ab, IgG: POSITIVE

## 2020-06-08 DIAGNOSIS — K219 Gastro-esophageal reflux disease without esophagitis: Secondary | ICD-10-CM | POA: Diagnosis not present

## 2020-06-08 DIAGNOSIS — I272 Pulmonary hypertension, unspecified: Secondary | ICD-10-CM | POA: Diagnosis not present

## 2020-06-08 DIAGNOSIS — R0902 Hypoxemia: Secondary | ICD-10-CM | POA: Diagnosis not present

## 2020-06-08 DIAGNOSIS — R002 Palpitations: Secondary | ICD-10-CM | POA: Diagnosis not present

## 2020-06-08 DIAGNOSIS — R0609 Other forms of dyspnea: Secondary | ICD-10-CM | POA: Diagnosis not present

## 2020-06-08 DIAGNOSIS — E119 Type 2 diabetes mellitus without complications: Secondary | ICD-10-CM | POA: Diagnosis not present

## 2020-06-08 DIAGNOSIS — Z8616 Personal history of COVID-19: Secondary | ICD-10-CM | POA: Diagnosis not present

## 2020-06-08 DIAGNOSIS — I2510 Atherosclerotic heart disease of native coronary artery without angina pectoris: Secondary | ICD-10-CM | POA: Diagnosis not present

## 2020-06-08 DIAGNOSIS — R0602 Shortness of breath: Secondary | ICD-10-CM | POA: Diagnosis not present

## 2020-06-08 DIAGNOSIS — R001 Bradycardia, unspecified: Secondary | ICD-10-CM | POA: Diagnosis not present

## 2020-06-08 DIAGNOSIS — E782 Mixed hyperlipidemia: Secondary | ICD-10-CM | POA: Diagnosis not present

## 2020-06-08 DIAGNOSIS — R600 Localized edema: Secondary | ICD-10-CM | POA: Diagnosis not present

## 2020-06-09 ENCOUNTER — Other Ambulatory Visit: Payer: Self-pay | Admitting: Family Medicine

## 2020-06-10 ENCOUNTER — Other Ambulatory Visit: Payer: Self-pay | Admitting: Family Medicine

## 2020-06-14 ENCOUNTER — Telehealth: Payer: Self-pay | Admitting: *Deleted

## 2020-06-14 ENCOUNTER — Ambulatory Visit: Payer: Self-pay | Admitting: *Deleted

## 2020-06-14 DIAGNOSIS — E1140 Type 2 diabetes mellitus with diabetic neuropathy, unspecified: Secondary | ICD-10-CM

## 2020-06-14 DIAGNOSIS — I10 Essential (primary) hypertension: Secondary | ICD-10-CM

## 2020-06-14 NOTE — Patient Instructions (Addendum)
Thank you allowing the Chronic Care Management Team to be a part of your care! It was a pleasure speaking with you today!  1. Please contact [facility] Cong for possible housing options [phone]  CCM (Chronic Care Management) Team   Neldon Labella RN, BSN Nurse Care Coordinator  [phone]  [place], LCSW Clinical Social Worker [phone]  Goals Addressed              This Visit's Progress   .  "I need to move out of my home by Thursday" completed (pt-stated)        CARE PLAN ENTRY (see longitudinal plan of care for additional care plan information)  Current Barriers:  . Housing barriers  Clinical Social Work Clinical Goal(s):  Marland Kitchen Over the next 90 days, patient will work with [employer] to address needs related to Housing  Interventions: . Inter-disciplinary care team collaboration (see longitudinal plan of care) . Patient continues to confirm that he has now moved in with her son and daughter in law and will remain there until permanent housing is identified . Patient now number 1 on the waiting list for [facility] . This Education officer, museum confirmed an opening at [facility] and suggested that she call them to follow up . Patient verbalized plan to contact [facility] for possible openings . Discussed plans with patient for ongoing care management follow up and provided patient with direct contact information for care management team if needed in the future.   Patient Self Care Activities:  . Performs ADL's independently . Calls provider office for new concerns or questions . Knowledge deficit related to Housing Needs  Please see past updates related to this goal by clicking on the "Past Updates" button in the selected goal          The patient verbalized understanding of instructions provided today and declined a print copy of patient instruction materials.   Telephone follow up appointment with care management team member  scheduled for:06/29/20

## 2020-06-14 NOTE — Telephone Encounter (Signed)
   Chronic Care Management   Unsuccessful Call Note 06/14/2020 Name: Teresa [name] MRN: [id number] DOB: [birth year]  Patient is a 74  year old female who sees Miguel Aschoff, MD for primary care. Dr Rosanna Randy asked the CCM team to consult the patient for [facility].      This social worker was unable to reach patient via telephone today for follow up regarding her housing needs.. I have left HIPAA compliant voicemail asking patient to return my call. (unsuccessful outreach #1).   Plan: Will follow-up within 7 business days via telephone.      Elliot Gurney, [place] Worker  [facility] Care Management [phone]

## 2020-06-14 NOTE — Chronic Care Management (AMB) (Signed)
Chronic Care Management    Clinical Social Work Follow Up Note  06/14/2020 Name: Teresa [name] MRN: [id number] DOB: 03/05/1946  Teresa [name] is a 74 y.o. year old female who is a primary care patient of [name], MD. The CCM team was consulted for assistance with [employer] .   Review of patient status, including review of consultants reports, other relevant assessments, and collaboration with appropriate care team members and the patient's provider was performed as part of comprehensive patient evaluation and provision of chronic care management services.    SDOH (Social Determinants of Health) assessments performed: No    Outpatient Encounter Medications as of 06/14/2020  Medication Sig  . acetaminophen (TYLENOL) 500 MG tablet Take 500 mg by mouth every 6 (six) hours as needed for mild pain or headache.  . allopurinol (ZYLOPRIM) 100 MG tablet TAKE 1 TABLET BY MOUTH EVERY DAY  . atorvastatin (LIPITOR) 10 MG tablet TAKE 1 TABLET BY MOUTH EVERY DAY AT BEDTIME  . benzonatate (TESSALON) 200 MG capsule Take 1 capsule (200 mg total) by mouth 3 (three) times daily. (Patient not taking: Reported on 12/05/2019)  . budesonide (PULMICORT) 0.5 MG/2ML nebulizer solution Inhale into the lungs.  . colchicine 0.6 MG tablet Take 0.6 mg by mouth daily as needed (for gout flare-ups).   . Continuous Blood Gluc Receiver (FREESTYLE LIBRE 2 READER) DEVI 1 each by Does not apply route continuous.  . Continuous Blood Gluc Sensor (FREESTYLE LIBRE 2 SENSOR) MISC USE EVERY 14 DAYS AS DIRECTED  . furosemide (LASIX) 40 MG tablet TAKE 1 TABLET BY MOUTH EVERY DAY  . gabapentin (NEURONTIN) 600 MG tablet Take 1 tablet (600 mg total) by mouth 3 (three) times daily.  Marland Kitchen glimepiride (AMARYL) 4 MG tablet TAKE 1 TABLET (4 MG TOTAL) BY MOUTH 2 (TWO) TIMES DAILY.  Marland Kitchen guaiFENesin-dextromethorphan (ROBITUSSIN DM) 100-10 MG/5ML syrup Take 5 mLs by mouth every 4 (four) hours as needed for cough. (Patient not  taking: Reported on 12/05/2019)  . JARDIANCE 25 MG TABS tablet TAKE 25 MG BY MOUTH DAILY.  Marland Kitchen levothyroxine (SYNTHROID) 88 MCG tablet TAKE 1 TABLET BY MOUTH EVERY DAY  . losartan (COZAAR) 25 MG tablet Take 1 tablet (25 mg total) by mouth daily.  Marland Kitchen menthol-cetylpyridinium (CEPACOL) 3 MG lozenge Take 1 lozenge (3 mg total) by mouth as needed for sore throat. (Patient not taking: Reported on 12/05/2019)  . metFORMIN (GLUCOPHAGE) 1000 MG tablet TAKE 1 TABLET BY MOUTH TWICE A DAY  . nystatin cream (MYCOSTATIN) Apply 1 application topically 2 (two) times daily. (Patient taking differently: Apply 1 application topically 2 (two) times daily as needed (for diabetic yeast reactions.). )  . ondansetron (ZOFRAN) 4 MG tablet TAKE 1 TABLET BY MOUTH EVERY 8 HOURS AS NEEDED FOR NAUSEA AND VOMITING  . OZEMPIC, 0.25 OR 0.5 MG/DOSE, 2 MG/1.5ML SOPN INJECT 0.5MG  INTO THE SKIN ONCE A WEEK.  . pioglitazone (ACTOS) 30 MG tablet TAKE 1 TABLET BY MOUTH EVERY DAY   No facility-administered encounter medications on file as of 06/14/2020.     Goals Addressed              This Visit's Progress   .  "I need to move out of my home by Thursday" completed (pt-stated)        CARE PLAN ENTRY (see longitudinal plan of care for additional care plan information)  Current Barriers:  . Housing barriers  Clinical Social Work Clinical Goal(s):  Marland Kitchen Over the next 90 days,  patient will work with [employer] to address needs related to Housing  Interventions: . Inter-disciplinary care team collaboration (see longitudinal plan of care) . Patient continues to confirm that he has now moved in with her son and daughter in law and will remain there until permanent housing is identified . Patient now number 1 on the waiting list for [facility] . This Education officer, museum confirmed an opening at [facility] and suggested that she call them to follow up . Patient verbalized plan to contact [facility] for possible  openings . Discussed plans with patient for ongoing care management follow up and provided patient with direct contact information for care management team if needed in the future.   Patient Self Care Activities:  . Performs ADL's independently . Calls provider office for new concerns or questions . Knowledge deficit related to Housing Needs  Please see past updates related to this goal by clicking on the "Past Updates" button in the selected goal          Follow Up Plan: SW will follow up with patient by phone over the next 14 business days    [place], [place] Worker  [facility] [phone]

## 2020-06-15 ENCOUNTER — Telehealth: Payer: Self-pay

## 2020-06-16 DIAGNOSIS — Z8616 Personal history of COVID-19: Secondary | ICD-10-CM | POA: Diagnosis not present

## 2020-06-16 DIAGNOSIS — J431 Panlobular emphysema: Secondary | ICD-10-CM | POA: Diagnosis not present

## 2020-06-16 DIAGNOSIS — Z01818 Encounter for other preprocedural examination: Secondary | ICD-10-CM | POA: Diagnosis not present

## 2020-06-22 DIAGNOSIS — J9811 Atelectasis: Secondary | ICD-10-CM | POA: Diagnosis not present

## 2020-06-22 DIAGNOSIS — J8489 Other specified interstitial pulmonary diseases: Secondary | ICD-10-CM | POA: Diagnosis not present

## 2020-06-22 DIAGNOSIS — I517 Cardiomegaly: Secondary | ICD-10-CM | POA: Diagnosis not present

## 2020-06-24 DIAGNOSIS — J431 Panlobular emphysema: Secondary | ICD-10-CM | POA: Diagnosis not present

## 2020-06-29 ENCOUNTER — Ambulatory Visit: Payer: PPO | Admitting: *Deleted

## 2020-06-29 DIAGNOSIS — I10 Essential (primary) hypertension: Secondary | ICD-10-CM

## 2020-06-29 DIAGNOSIS — E1140 Type 2 diabetes mellitus with diabetic neuropathy, unspecified: Secondary | ICD-10-CM

## 2020-06-29 NOTE — Chronic Care Management (AMB) (Signed)
Chronic Care Management    Clinical Social Work Follow Up Note  06/29/2020 Name: Teresa [name] MRN: [id number] DOB: Dec 16, 1945  Teresa [name] is a 74 y.o. year old female who is a primary care patient of [name], MD. The CCM team was consulted for assistance with [employer] .   Review of patient status, including review of consultants reports, other relevant assessments, and collaboration with appropriate care team members and the patient's provider was performed as part of comprehensive patient evaluation and provision of chronic care management services.    SDOH (Social Determinants of Health) assessments performed: No    Outpatient Encounter Medications as of 06/29/2020  Medication Sig  . acetaminophen (TYLENOL) 500 MG tablet Take 500 mg by mouth every 6 (six) hours as needed for mild pain or headache.  . allopurinol (ZYLOPRIM) 100 MG tablet TAKE 1 TABLET BY MOUTH EVERY DAY  . atorvastatin (LIPITOR) 10 MG tablet TAKE 1 TABLET BY MOUTH EVERY DAY AT BEDTIME  . benzonatate (TESSALON) 200 MG capsule Take 1 capsule (200 mg total) by mouth 3 (three) times daily. (Patient not taking: Reported on 12/05/2019)  . budesonide (PULMICORT) 0.5 MG/2ML nebulizer solution Inhale into the lungs.  . colchicine 0.6 MG tablet Take 0.6 mg by mouth daily as needed (for gout flare-ups).   . Continuous Blood Gluc Receiver (FREESTYLE LIBRE 2 READER) DEVI 1 each by Does not apply route continuous.  . Continuous Blood Gluc Sensor (FREESTYLE LIBRE 2 SENSOR) MISC USE EVERY 14 DAYS AS DIRECTED  . furosemide (LASIX) 40 MG tablet TAKE 1 TABLET BY MOUTH EVERY DAY  . gabapentin (NEURONTIN) 600 MG tablet Take 1 tablet (600 mg total) by mouth 3 (three) times daily.  Marland Kitchen glimepiride (AMARYL) 4 MG tablet TAKE 1 TABLET (4 MG TOTAL) BY MOUTH 2 (TWO) TIMES DAILY.  Marland Kitchen guaiFENesin-dextromethorphan (ROBITUSSIN DM) 100-10 MG/5ML syrup Take 5 mLs by mouth every 4 (four) hours as needed for cough. (Patient not  taking: Reported on 12/05/2019)  . JARDIANCE 25 MG TABS tablet TAKE 25 MG BY MOUTH DAILY.  Marland Kitchen levothyroxine (SYNTHROID) 88 MCG tablet TAKE 1 TABLET BY MOUTH EVERY DAY  . losartan (COZAAR) 25 MG tablet Take 1 tablet (25 mg total) by mouth daily.  Marland Kitchen menthol-cetylpyridinium (CEPACOL) 3 MG lozenge Take 1 lozenge (3 mg total) by mouth as needed for sore throat. (Patient not taking: Reported on 12/05/2019)  . metFORMIN (GLUCOPHAGE) 1000 MG tablet TAKE 1 TABLET BY MOUTH TWICE A DAY  . nystatin cream (MYCOSTATIN) Apply 1 application topically 2 (two) times daily. (Patient taking differently: Apply 1 application topically 2 (two) times daily as needed (for diabetic yeast reactions.). )  . ondansetron (ZOFRAN) 4 MG tablet TAKE 1 TABLET BY MOUTH EVERY 8 HOURS AS NEEDED FOR NAUSEA AND VOMITING  . OZEMPIC, 0.25 OR 0.5 MG/DOSE, 2 MG/1.5ML SOPN INJECT 0.5MG  INTO THE SKIN ONCE A WEEK.  . pioglitazone (ACTOS) 30 MG tablet TAKE 1 TABLET BY MOUTH EVERY DAY   No facility-administered encounter medications on file as of 06/29/2020.     Goals Addressed              This Visit's Progress   .  "I need to move out of my home by Thursday" completed (pt-stated)        CARE PLAN ENTRY (see longitudinal plan of care for additional care plan information)  Current Barriers:  . Housing barriers  Clinical Social Work Clinical Goal(s):  Marland Kitchen Over the next 90 days,  patient will work with [employer] to address needs related to Housing  Interventions: . Inter-disciplinary care team collaboration (see longitudinal plan of care) . Patient continues to confirm that he has now moved in with her son and daughter in law and will remain there until permanent housing is identified . Patient now number 1 on the waiting list for [facility] . This Education officer, museum provided contact information for [employer] they have multiple openings . Patient states she will contact them to complete the  application process . Patient discussed feeling sick today-possible bronchitis-it was suggested that she contact her primary if symptoms persist . Discussed plans with patient for ongoing care management follow up and provided patient with direct contact information for care management team if needed in the future.   Patient Self Care Activities:  . Performs ADL's independently . Calls provider office for new concerns or questions . Knowledge deficit related to Housing Needs  Please see past updates related to this goal by clicking on the "Past Updates" button in the selected goal          Follow Up Plan: SW will follow up with patient by phone over the next 14 business days    [place], [place] Worker  [facility] [phone]

## 2020-06-29 NOTE — Patient Instructions (Signed)
Thank you allowing the Chronic Care Management Team to be a part of your care! It was a pleasure speaking with you today!  1. Please call [facility] for possible openings for housing  CCM (Chronic Care Management) Team   Neldon Labella RN, BSN Nurse Care Coordinator  [phone]  [place], LCSW Clinical Social Worker [phone]  Goals Addressed              This Visit's Progress   .  "I need to move out of my home by Thursday" completed (pt-stated)        CARE PLAN ENTRY (see longitudinal plan of care for additional care plan information)  Current Barriers:  . Housing barriers  Clinical Social Work Clinical Goal(s):  Marland Kitchen Over the next 90 days, patient will work with [employer] to address needs related to Housing  Interventions: . Inter-disciplinary care team collaboration (see longitudinal plan of care) . Patient continues to confirm that he has now moved in with her son and daughter in law and will remain there until permanent housing is identified . Patient now number 1 on the waiting list for [facility] . This Education officer, museum provided contact information for [employer] they have multiple openings . Patient states she will contact them to complete the application process . Discussed plans with patient for ongoing care management follow up and provided patient with direct contact information for care management team if needed in the future.   Patient Self Care Activities:  . Performs ADL's independently . Calls provider office for new concerns or questions . Knowledge deficit related to Housing Needs  Please see past updates related to this goal by clicking on the "Past Updates" button in the selected goal          The patient verbalized understanding of instructions, educational materials, and care plan provided today and declined offer to receive copy of patient instructions, educational materials, and care  plan.   Telephone follow up appointment with care management team member scheduled for: 07/15/20

## 2020-07-05 ENCOUNTER — Ambulatory Visit: Payer: PPO | Admitting: Podiatry

## 2020-07-07 DIAGNOSIS — E119 Type 2 diabetes mellitus without complications: Secondary | ICD-10-CM | POA: Diagnosis not present

## 2020-07-07 LAB — HM DIABETES EYE EXAM

## 2020-07-08 ENCOUNTER — Encounter: Payer: Self-pay | Admitting: Podiatry

## 2020-07-08 ENCOUNTER — Other Ambulatory Visit: Payer: Self-pay

## 2020-07-08 ENCOUNTER — Ambulatory Visit: Payer: PPO | Admitting: Podiatry

## 2020-07-08 DIAGNOSIS — G629 Polyneuropathy, unspecified: Secondary | ICD-10-CM | POA: Diagnosis not present

## 2020-07-08 DIAGNOSIS — B351 Tinea unguium: Secondary | ICD-10-CM | POA: Diagnosis not present

## 2020-07-08 DIAGNOSIS — E1142 Type 2 diabetes mellitus with diabetic polyneuropathy: Secondary | ICD-10-CM | POA: Diagnosis not present

## 2020-07-08 DIAGNOSIS — M79676 Pain in unspecified toe(s): Secondary | ICD-10-CM

## 2020-07-08 NOTE — Progress Notes (Signed)
This patient returns to my office for at risk foot care.  This patient requires this care by a professional since this patient will be at risk due to having  Covid 19 and diabetes.   This patient is unable to cut nails herself since the patient cannot reach her nails.These nails are painful walking and wearing shoes.  This patient presents for at risk foot care today.  General Appearance  Alert, conversant and in no acute stress.  Vascular  Dorsalis pedis and posterior tibial  pulses are palpable  bilaterally.  Capillary return is within normal limits  bilaterally. Temperature is within normal limits  bilaterally.  Neurologic  Senn-Weinstein monofilament wire test within normal limits  bilaterally. Muscle power within normal limits bilaterally.  Nails Thick disfigured discolored nails with subungual debris  from hallux to fifth toes bilaterally. No evidence of bacterial infection or drainage bilaterally.  Orthopedic  No limitations of motion  feet .  No crepitus or effusions noted.  No bony pathology or digital deformities noted.Dorsal lipping first metatarsal  B/L. Hallux limitus 1st MPJ right foot.  Skin  normotropic skin with no porokeratosis noted bilaterally.  No signs of infections or ulcers noted.     Onychomycosis  Pain in right toes  Pain in left toes  Consent was obtained for treatment procedures.   Mechanical debridement of nails 1-5  bilaterally performed with a nail nipper.  Filed with dremel without incident.    Return office visit     3 months                Told patient to return for periodic foot care and evaluation due to potential at risk complications.   Gardiner Barefoot DPM

## 2020-07-15 ENCOUNTER — Ambulatory Visit (INDEPENDENT_AMBULATORY_CARE_PROVIDER_SITE_OTHER): Payer: PPO | Admitting: *Deleted

## 2020-07-15 ENCOUNTER — Encounter: Payer: Self-pay | Admitting: *Deleted

## 2020-07-15 DIAGNOSIS — E1140 Type 2 diabetes mellitus with diabetic neuropathy, unspecified: Secondary | ICD-10-CM

## 2020-07-15 DIAGNOSIS — I10 Essential (primary) hypertension: Secondary | ICD-10-CM

## 2020-07-15 NOTE — Chronic Care Management (AMB) (Signed)
  Chronic Care Management    Clinical Social Work Follow Up Note  07/15/2020 Name: RENIYAH GOOTEE MRN: [id number] DOB: 1946/02/02  Teresa [name] is a 74 y.o. year old female who is a primary care patient of [name], MD. The CCM team was consulted for assistance with [employer] .   Review of patient status, including review of consultants reports, other relevant assessments, and collaboration with appropriate care team members and the patient's provider was performed as part of comprehensive patient evaluation and provision of chronic care management services.    SDOH (Social Determinants of Health) assessments performed: No    Outpatient Encounter Medications as of 07/15/2020  Medication Sig  . acetaminophen (TYLENOL) 500 MG tablet Take 500 mg by mouth every 6 (six) hours as needed for mild pain or headache.  . allopurinol (ZYLOPRIM) 100 MG tablet TAKE 1 TABLET BY MOUTH EVERY DAY  . atorvastatin (LIPITOR) 10 MG tablet TAKE 1 TABLET BY MOUTH EVERY DAY AT BEDTIME  . benzonatate (TESSALON) 200 MG capsule Take 1 capsule (200 mg total) by mouth 3 (three) times daily.  . budesonide (PULMICORT) 0.5 MG/2ML nebulizer solution Inhale into the lungs.  . colchicine 0.6 MG tablet Take 0.6 mg by mouth daily as needed (for gout flare-ups).   . Continuous Blood Gluc Receiver (FREESTYLE LIBRE 2 READER) DEVI 1 each by Does not apply route continuous.  . Continuous Blood Gluc Sensor (FREESTYLE LIBRE 2 SENSOR) MISC USE EVERY 14 DAYS AS DIRECTED  . furosemide (LASIX) 40 MG tablet TAKE 1 TABLET BY MOUTH EVERY DAY  . gabapentin (NEURONTIN) 600 MG tablet Take 1 tablet (600 mg total) by mouth 3 (three) times daily.  Marland Kitchen glimepiride (AMARYL) 4 MG tablet TAKE 1 TABLET (4 MG TOTAL) BY MOUTH 2 (TWO) TIMES DAILY.  Marland Kitchen guaiFENesin-dextromethorphan (ROBITUSSIN DM) 100-10 MG/5ML syrup Take 5 mLs by mouth every 4 (four) hours as needed for cough.  Marland Kitchen JARDIANCE 25 MG TABS tablet TAKE 25 MG BY MOUTH DAILY.   Marland Kitchen levothyroxine (SYNTHROID) 88 MCG tablet TAKE 1 TABLET BY MOUTH EVERY DAY  . losartan (COZAAR) 25 MG tablet Take 1 tablet (25 mg total) by mouth daily.  Marland Kitchen menthol-cetylpyridinium (CEPACOL) 3 MG lozenge Take 1 lozenge (3 mg total) by mouth as needed for sore throat.  . metFORMIN (GLUCOPHAGE) 1000 MG tablet TAKE 1 TABLET BY MOUTH TWICE A DAY  . nystatin cream (MYCOSTATIN) Apply 1 application topically 2 (two) times daily. (Patient taking differently: Apply 1 application topically 2 (two) times daily as needed (for diabetic yeast reactions.). )  . ondansetron (ZOFRAN) 4 MG tablet TAKE 1 TABLET BY MOUTH EVERY 8 HOURS AS NEEDED FOR NAUSEA AND VOMITING  . OZEMPIC, 0.25 OR 0.5 MG/DOSE, 2 MG/1.5ML SOPN INJECT 0.5MG  INTO THE SKIN ONCE A WEEK.  . pioglitazone (ACTOS) 30 MG tablet TAKE 1 TABLET BY MOUTH EVERY DAY   No facility-administered encounter medications on file as of 07/15/2020.     Goals Addressed   None      Follow Up Plan: SW will follow up with patient by phone over the next 14 buisness days   [place], [place] Worker  [facility] [phone]

## 2020-07-15 NOTE — Chronic Care Management (AMB) (Signed)
Chronic Care Management    Clinical Social Work Follow Up Note  07/15/2020 Name: Teresa [name] MRN: [id number] DOB: 1946-01-17  Teresa [name] is a 74 y.o. year old female who is a primary care patient of [name], MD. The CCM team was consulted for assistance with [employer] .   Review of patient status, including review of consultants reports, other relevant assessments, and collaboration with appropriate care team members and the patient's provider was performed as part of comprehensive patient evaluation and provision of chronic care management services.    SDOH (Social Determinants of Health) assessments performed: No    Outpatient Encounter Medications as of 07/15/2020  Medication Sig  . acetaminophen (TYLENOL) 500 MG tablet Take 500 mg by mouth every 6 (six) hours as needed for mild pain or headache.  . allopurinol (ZYLOPRIM) 100 MG tablet TAKE 1 TABLET BY MOUTH EVERY DAY  . atorvastatin (LIPITOR) 10 MG tablet TAKE 1 TABLET BY MOUTH EVERY DAY AT BEDTIME  . benzonatate (TESSALON) 200 MG capsule Take 1 capsule (200 mg total) by mouth 3 (three) times daily.  . budesonide (PULMICORT) 0.5 MG/2ML nebulizer solution Inhale into the lungs.  . colchicine 0.6 MG tablet Take 0.6 mg by mouth daily as needed (for gout flare-ups).   . Continuous Blood Gluc Receiver (FREESTYLE LIBRE 2 READER) DEVI 1 each by Does not apply route continuous.  . Continuous Blood Gluc Sensor (FREESTYLE LIBRE 2 SENSOR) MISC USE EVERY 14 DAYS AS DIRECTED  . furosemide (LASIX) 40 MG tablet TAKE 1 TABLET BY MOUTH EVERY DAY  . gabapentin (NEURONTIN) 600 MG tablet Take 1 tablet (600 mg total) by mouth 3 (three) times daily.  Marland Kitchen glimepiride (AMARYL) 4 MG tablet TAKE 1 TABLET (4 MG TOTAL) BY MOUTH 2 (TWO) TIMES DAILY.  Marland Kitchen guaiFENesin-dextromethorphan (ROBITUSSIN DM) 100-10 MG/5ML syrup Take 5 mLs by mouth every 4 (four) hours as needed for cough.  Marland Kitchen JARDIANCE 25 MG TABS tablet TAKE 25 MG BY MOUTH DAILY.   Marland Kitchen levothyroxine (SYNTHROID) 88 MCG tablet TAKE 1 TABLET BY MOUTH EVERY DAY  . losartan (COZAAR) 25 MG tablet Take 1 tablet (25 mg total) by mouth daily.  Marland Kitchen menthol-cetylpyridinium (CEPACOL) 3 MG lozenge Take 1 lozenge (3 mg total) by mouth as needed for sore throat.  . metFORMIN (GLUCOPHAGE) 1000 MG tablet TAKE 1 TABLET BY MOUTH TWICE A DAY  . nystatin cream (MYCOSTATIN) Apply 1 application topically 2 (two) times daily. (Patient taking differently: Apply 1 application topically 2 (two) times daily as needed (for diabetic yeast reactions.). )  . ondansetron (ZOFRAN) 4 MG tablet TAKE 1 TABLET BY MOUTH EVERY 8 HOURS AS NEEDED FOR NAUSEA AND VOMITING  . OZEMPIC, 0.25 OR 0.5 MG/DOSE, 2 MG/1.5ML SOPN INJECT 0.5MG  INTO THE SKIN ONCE A WEEK.  . pioglitazone (ACTOS) 30 MG tablet TAKE 1 TABLET BY MOUTH EVERY DAY   No facility-administered encounter medications on file as of 07/15/2020.     Goals Addressed              This Visit's Progress   .  "I need to move out of my home by Thursday" completed (pt-stated)        CARE PLAN ENTRY (see longitudinal plan of care for additional care plan information)  Current Barriers:  . Housing barriers  Clinical Social Work Clinical Goal(s):  Marland Kitchen Over the next 90 days, patient will work with [employer] to address needs related to Housing  Interventions: . Patient continues to  confirm that he has now moved in with her son and daughter in law and will remain there until permanent housing is identified . Patient now number 1 on the waiting list for [facility] . This Education officer, museum provided contacted [employer] and confirmed that they hhave 2 additional openings . Patient previously stated that there was a 6 month waiting list but may have called the incorrect housing resource . Phone call to patient to inform her of this, voicemail left requesting a return call . Discussed plans with patient for ongoing care management follow  up and provided patient with direct contact information for care management team if needed in the future.   Patient Self Care Activities:  . Performs ADL's independently . Calls provider office for new concerns or questions . Knowledge deficit related to Housing Needs  Please see past updates related to this goal by clicking on the "Past Updates" button in the selected goal          Follow Up Plan: SW will follow up with patient by phone over the next 14 business days    [place], [place] Worker  [facility] [phone]

## 2020-07-15 NOTE — Progress Notes (Signed)
This encounter was created in error - please disregard.

## 2020-07-16 ENCOUNTER — Other Ambulatory Visit: Payer: Self-pay | Admitting: *Deleted

## 2020-07-16 DIAGNOSIS — J431 Panlobular emphysema: Secondary | ICD-10-CM | POA: Diagnosis not present

## 2020-07-16 DIAGNOSIS — E1140 Type 2 diabetes mellitus with diabetic neuropathy, unspecified: Secondary | ICD-10-CM

## 2020-07-16 MED ORDER — FREESTYLE LIBRE 2 SENSOR MISC: 1 refills | Status: DC

## 2020-07-16 MED ORDER — FREESTYLE LIBRE 2 READER DEVI: 1.0000 | 0 refills | Status: DC

## 2020-07-16 NOTE — Progress Notes (Signed)
This encounter was created in error - please disregard.

## 2020-07-16 NOTE — Chronic Care Management (AMB) (Signed)
Chronic Care Management    Clinical Social Work Follow Up Note  07/16/2020 Name: Teresa [name] MRN: [id number] DOB: 12-14-1945  Teresa [name] is a 74 y.o. year old female who is a primary care patient of [name], MD. The CCM team was consulted for assistance with [employer] .   Review of patient status, including review of consultants reports, other relevant assessments, and collaboration with appropriate care team members and the patient's provider was performed as part of comprehensive patient evaluation and provision of chronic care management services.    SDOH (Social Determinants of Health) assessments performed: No    Outpatient Encounter Medications as of 07/15/2020  Medication Sig  . acetaminophen (TYLENOL) 500 MG tablet Take 500 mg by mouth every 6 (six) hours as needed for mild pain or headache.  . allopurinol (ZYLOPRIM) 100 MG tablet TAKE 1 TABLET BY MOUTH EVERY DAY  . atorvastatin (LIPITOR) 10 MG tablet TAKE 1 TABLET BY MOUTH EVERY DAY AT BEDTIME  . benzonatate (TESSALON) 200 MG capsule Take 1 capsule (200 mg total) by mouth 3 (three) times daily.  . budesonide (PULMICORT) 0.5 MG/2ML nebulizer solution Inhale into the lungs.  . colchicine 0.6 MG tablet Take 0.6 mg by mouth daily as needed (for gout flare-ups).   . furosemide (LASIX) 40 MG tablet TAKE 1 TABLET BY MOUTH EVERY DAY  . gabapentin (NEURONTIN) 600 MG tablet Take 1 tablet (600 mg total) by mouth 3 (three) times daily.  Marland Kitchen glimepiride (AMARYL) 4 MG tablet TAKE 1 TABLET (4 MG TOTAL) BY MOUTH 2 (TWO) TIMES DAILY.  Marland Kitchen guaiFENesin-dextromethorphan (ROBITUSSIN DM) 100-10 MG/5ML syrup Take 5 mLs by mouth every 4 (four) hours as needed for cough.  Marland Kitchen JARDIANCE 25 MG TABS tablet TAKE 25 MG BY MOUTH DAILY.  Marland Kitchen levothyroxine (SYNTHROID) 88 MCG tablet TAKE 1 TABLET BY MOUTH EVERY DAY  . losartan (COZAAR) 25 MG tablet Take 1 tablet (25 mg total) by mouth daily.  Marland Kitchen menthol-cetylpyridinium (CEPACOL) 3 MG  lozenge Take 1 lozenge (3 mg total) by mouth as needed for sore throat.  . metFORMIN (GLUCOPHAGE) 1000 MG tablet TAKE 1 TABLET BY MOUTH TWICE A DAY  . nystatin cream (MYCOSTATIN) Apply 1 application topically 2 (two) times daily. (Patient taking differently: Apply 1 application topically 2 (two) times daily as needed (for diabetic yeast reactions.). )  . ondansetron (ZOFRAN) 4 MG tablet TAKE 1 TABLET BY MOUTH EVERY 8 HOURS AS NEEDED FOR NAUSEA AND VOMITING  . OZEMPIC, 0.25 OR 0.5 MG/DOSE, 2 MG/1.5ML SOPN INJECT 0.5MG  INTO THE SKIN ONCE A WEEK.  . pioglitazone (ACTOS) 30 MG tablet TAKE 1 TABLET BY MOUTH EVERY DAY  . [DISCONTINUED] Continuous Blood Gluc Receiver (FREESTYLE LIBRE 2 READER) DEVI 1 each by Does not apply route continuous.  . [DISCONTINUED] Continuous Blood Gluc Sensor (FREESTYLE LIBRE 2 SENSOR) MISC USE EVERY 14 DAYS AS DIRECTED   No facility-administered encounter medications on file as of 07/15/2020.     Goals Addressed              This Visit's Progress   .  "I need to move out of my home by Thursday" completed (pt-stated)        CARE PLAN ENTRY (see longitudinal plan of care for additional care plan information)  Current Barriers:  . Housing barriers  Clinical Social Work Clinical Goal(s):  Marland Kitchen Over the next 90 days, patient will work with [employer] to address needs related to Housing  Interventions: . Patient  continues to confirm that he has now moved in with her son and daughter in law and will remain there until permanent housing is identified . Patient continues to stated that she is #1 on the waiting list for [place] . Phone call made to [facility] to confirm two openings . This Education officer, museum discussed openings for  [employer] and confirmed that they have 2 additional openings and encouraged patient to contact Latoya at [facility] to discuss application process-patient agreed to do sor . Discussed plans with patient for  ongoing care management follow up and provided patient with direct contact information for care management team if needed in the future.   Patient Self Care Activities:  . Performs ADL's independently . Calls provider office for new concerns or questions . Knowledge deficit related to Housing Needs  Please see past updates related to this goal by clicking on the "Past Updates" button in the selected goal          Follow Up Plan: SW will follow up with patient by phone over the next 30 days    [place], [place] Worker  [facility] [phone]

## 2020-07-16 NOTE — Patient Instructions (Signed)
Thank you allowing the Chronic Care Management Team to be a part of your care! It was a pleasure speaking with you today!  1. Please call [facility] for availabilities  CCM (Chronic Care Management) Team   Neldon Labella RN, BSN Nurse Care Coordinator  [phone]  [place], LCSW Clinical Social Worker [phone]  Goals Addressed              This Visit's Progress   .  "I need to move out of my home by Thursday" completed (pt-stated)        CARE PLAN ENTRY (see longitudinal plan of care for additional care plan information)  Current Barriers:  . Housing barriers  Clinical Social Work Clinical Goal(s):  Marland Kitchen Over the next 90 days, patient will work with [employer] to address needs related to Housing  Interventions: . Patient continues to confirm that he has now moved in with her son and daughter in law and will remain there until permanent housing is identified . Patient continues to stated that she is #1 on the waiting list for [facility] . Phone call made to [facility] to confirm two openings . This Education officer, museum discussed openings for  [employer] and confirmed that they have 2 additional openings and encouraged patient to contact Latoya at [facility] to discuss application process-patient agreed to do sor . Discussed plans with patient for ongoing care management follow up and provided patient with direct contact information for care management team if needed in the future.   Patient Self Care Activities:  . Performs ADL's independently . Calls provider office for new concerns or questions . Knowledge deficit related to Housing Needs  Please see past updates related to this goal by clicking on the "Past Updates" button in the selected goal          The patient verbalized understanding of instructions, educational materials, and care plan provided today and declined offer to receive copy of patient  instructions, educational materials, and care plan.   Telephone follow up appointment with care management team member scheduled for:08/03/20

## 2020-07-19 ENCOUNTER — Telehealth: Payer: Self-pay

## 2020-07-21 ENCOUNTER — Other Ambulatory Visit: Payer: Self-pay | Admitting: Family Medicine

## 2020-07-21 DIAGNOSIS — E1140 Type 2 diabetes mellitus with diabetic neuropathy, unspecified: Secondary | ICD-10-CM

## 2020-08-16 DIAGNOSIS — J431 Panlobular emphysema: Secondary | ICD-10-CM | POA: Diagnosis not present

## 2020-08-16 DIAGNOSIS — R002 Palpitations: Secondary | ICD-10-CM | POA: Diagnosis not present

## 2020-08-19 ENCOUNTER — Telehealth: Payer: Self-pay

## 2020-08-19 NOTE — Chronic Care Management (AMB) (Signed)
Chronic Care Management Pharmacy Assistant   Name: Teresa [name]  MRN: [id number] DOB: [birth year]  Reason for Encounter: Disease State - Diabetes, Hypertension and Lipid Adherence  Patient Questions:   1.  Have you seen any other providers since your last visit? Yes 06/01/2020- Richard L. Cranford Mon. MD  Influenza vaccination 06/08/2020- Dwayne d. Callwood, Cardiology - palpitations. 06/16/2020 - Frederica Kuster - Emergency Medicine -  06/16/2020 / 06/22/2020 Ottie Glazier - Pulmonary Disease 07/07/2020 Birder Robson - Ophthalmology 07/08/2020 Gardiner Barefoot , DPM Podiatry      2.  Any changes in your medicines or health? No   .  PCP : [name], MD  Allergies:   Allergies  Allergen Reactions   Bacitracin-Neomycin-Polymyxin    Clarithromycin Other (See Comments), Nausea Only and Nausea And Vomiting   Codeine     Other reaction(s): Hallucination   Dilaudid  [Hydromorphone Hcl] Nausea And Vomiting   Iodine    Iohexol      Desc: PT HAS HIVES/ITCHING    Neomycin-Bacitracin Zn-Polymyx    Tamiflu  [Oseltamivir Phosphate]     Other reaction(s): Abdominal Pain, Vomiting   Zolpidem Nausea And Vomiting and Other (See Comments)    Hallucinations   Benzalkonium Chloride Itching, Rash and Swelling   Lidocaine Hcl Itching, Rash and Swelling   Morphine Nausea Only, Rash and Nausea And Vomiting   Tape Itching and Rash   Tapentadol Rash    Medications: Outpatient Encounter Medications as of 08/19/2020  Medication Sig   acetaminophen (TYLENOL) 500 MG tablet Take 500 mg by mouth every 6 (six) hours as needed for mild pain or headache.   allopurinol (ZYLOPRIM) 100 MG tablet TAKE 1 TABLET BY MOUTH EVERY DAY   atorvastatin (LIPITOR) 10 MG tablet TAKE 1 TABLET BY MOUTH EVERY DAY AT BEDTIME   benzonatate (TESSALON) 200 MG capsule Take 1 capsule (200 mg total) by mouth 3 (three) times daily.   budesonide (PULMICORT) 0.5 MG/2ML nebulizer solution  Inhale into the lungs.   colchicine 0.6 MG tablet Take 0.6 mg by mouth daily as needed (for gout flare-ups).    Continuous Blood Gluc Receiver (FREESTYLE LIBRE 2 READER) DEVI 1 each by Does not apply route continuous.   Continuous Blood Gluc Sensor (FREESTYLE LIBRE 2 SENSOR) MISC Continuous monitoring   furosemide (LASIX) 40 MG tablet TAKE 1 TABLET BY MOUTH EVERY DAY   gabapentin (NEURONTIN) 600 MG tablet Take 1 tablet (600 mg total) by mouth 3 (three) times daily.   glimepiride (AMARYL) 4 MG tablet TAKE 1 TABLET (4 MG TOTAL) BY MOUTH 2 (TWO) TIMES DAILY.   guaiFENesin-dextromethorphan (ROBITUSSIN DM) 100-10 MG/5ML syrup Take 5 mLs by mouth every 4 (four) hours as needed for cough.   JARDIANCE 25 MG TABS tablet TAKE 25 MG BY MOUTH DAILY.   levothyroxine (SYNTHROID) 88 MCG tablet TAKE 1 TABLET BY MOUTH EVERY DAY   losartan (COZAAR) 25 MG tablet Take 1 tablet (25 mg total) by mouth daily.   menthol-cetylpyridinium (CEPACOL) 3 MG lozenge Take 1 lozenge (3 mg total) by mouth as needed for sore throat.   metFORMIN (GLUCOPHAGE) 1000 MG tablet TAKE 1 TABLET BY MOUTH TWICE A DAY   nystatin cream (MYCOSTATIN) Apply 1 application topically 2 (two) times daily. (Patient taking differently: Apply 1 application topically 2 (two) times daily as needed (for diabetic yeast reactions.). )   ondansetron (ZOFRAN) 4 MG tablet TAKE 1 TABLET BY MOUTH EVERY 8 HOURS AS NEEDED FOR NAUSEA AND VOMITING  OZEMPIC, 0.25 OR 0.5 MG/DOSE, 2 MG/1.5ML SOPN INJECT 0.5MG  INTO THE SKIN ONCE A WEEK.   pioglitazone (ACTOS) 30 MG tablet TAKE 1 TABLET BY MOUTH EVERY DAY   No facility-administered encounter medications on file as of 08/19/2020.    Current Diagnosis: Patient Active Problem List   Diagnosis Date Noted   Oxygen desaturation 06/24/2019   Encounter for examination following treatment at hospital 06/24/2019   History of 2019 novel coronavirus disease (COVID-19) 06/24/2019   Pneumonia due to COVID-19  virus 05/16/2019   Intractable nausea and vomiting 05/16/2019   Chronic diastolic CHF (congestive heart failure) ([place]) 05/16/2019   Encounter for screening colonoscopy    Benign neoplasm of cecum    Diverticulosis of large intestine without diverticulitis    Tortuous colon    Polyp of sigmoid colon    Intestinal lump    Polyneuropathy associated with underlying disease ([place]) 12/11/2016   Microcalcifications of the breast 12/15/2015   Back pain, chronic 06/28/2015   Chronic airway obstruction ([place]) 06/28/2015   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   Essential hypertension 06/21/2009   CAD (coronary artery disease) 06/21/2009   FEVER UNSPECIFIED 06/21/2009   NAUSEA 06/21/2009   DYSPHAGIA 06/21/2009   RUQ PAIN 06/21/2009   HIATAL HERNIA 01/07/2008   Hypothyroidism 11/22/2007   ANXIETY 11/22/2007   DEPRESSION 11/22/2007   Hypertensive heart disease without heart failure 11/22/2007   ANGINA PECTORIS 11/22/2007   RENAL CALCULUS 11/22/2007   Osteoarthritis 11/22/2007   SLEEP APNEA 11/22/2007   HEADACHE, CHRONIC 11/22/2007   GASTRITIS 10/23/2007   DIVERTICULOSIS, COLON 10/23/2007   Diabetes mellitus type II, non insulin dependent ([place]) 07/16/2007   HYPERCHOLESTEROLEMIA 07/16/2007   OBESITY, MODERATE 07/16/2007   ISCHEMIC HEART DISEASE 07/16/2007   ESOPHAGEAL REFLUX 07/16/2007   SCLERODERMA 07/16/2007   DYSPNEA 07/16/2007   COUGH, CHRONIC 07/16/2007   Recent Relevant Labs: Lab Results  Component Value Date/Time   HGBA1C 6.9 (A) 06/01/2020 12:15 PM   HGBA1C 7.4 (A) 01/19/2020 10:29 AM   HGBA1C 7.4 (H) 09/18/2019 11:21 AM   HGBA1C 8.4 (H) 05/17/2019 03:05 AM   HGBA1C 7.6 (H) 04/04/2013 10:47 AM   MICROALBUR 50 06/06/2017 11:26 AM   MICROALBUR 20 12/08/2015 10:39 AM    Kidney Function Lab Results  Component Value Date/Time   CREATININE 0.82 06/01/2020 03:14 PM   CREATININE 0.67 09/18/2019 11:21 AM   CREATININE 0.73 05/14/2014 09:56  AM   CREATININE 0.68 04/04/2013 10:47 AM   GFRNONAA 71 06/01/2020 03:14 PM   GFRNONAA >60 05/14/2014 09:56 AM   GFRNONAA >60 04/04/2013 10:47 AM   GFRAA 82 06/01/2020 03:14 PM   GFRAA >60 05/14/2014 09:56 AM   GFRAA >60 04/04/2013 10:47 AM    Current antihyperglycemic regimen:  o Glimepiride 4 mg one tablet a day o Jardiance 25 mg one tablet a day o Metformin 1000 mg one tablet twice a day o Ozempic 0.5 mg once a week o Actos 30 mg - one tablet a day   What recent interventions/DTPs have been made to improve glycemic control:  o Patient states she has been taking her medication as directed.    Have there been any recent hospitalizations or ED visits since last visit with CPP?No    Patient reports hypoglycemic symptoms, including Pale, Sweaty, Shaky, Hungry, Nervous/irritable and Vision changes Patient states she does have some hypoglycemic symptoms once in a while.  Patient reports hyperglycemic symptoms, including blurry vision, excessive thirst, fatigue, polyuria and weakness. Patient states she does have some hyperglycemic symptoms  once in a while.   How often are you checking your blood sugar? She has a freestyle monitor    What are your blood sugars ranging? None o Fasting: None o Before meals: None  o After meals: None o Bedtime: None  During the week, how often does your blood glucose drop below 70? Yes. Some times    Are you checking your feet daily/regularly? Yes, No open sores, or pain ,states she does have some tingling  Adherence Review: Is the patient currently on a STATIN medication? Yes  Is the patient currently on ACE/ARB medication? Yes Does the patient have >5 day gap between last estimated fill dates? No  Reviewed chart prior to disease state call. Spoke with patient regarding BP  Recent Office Vitals: BP Readings from Last 3 Encounters:  06/01/20 97/68  01/19/20 100/68  09/18/19 116/71   Pulse Readings from Last 3 Encounters:  06/01/20 67   01/19/20 69  09/18/19 (!) 59    Wt Readings from Last 3 Encounters:  06/01/20 197 lb (89.4 kg)  01/19/20 202 lb 12.8 oz (92 kg)  09/18/19 200 lb (90.7 kg)     Kidney Function Lab Results  Component Value Date/Time   CREATININE 0.82 06/01/2020 03:14 PM   CREATININE 0.67 09/18/2019 11:21 AM   CREATININE 0.73 05/14/2014 09:56 AM   CREATININE 0.68 04/04/2013 10:47 AM   GFRNONAA 71 06/01/2020 03:14 PM   GFRNONAA >60 05/14/2014 09:56 AM   GFRNONAA >60 04/04/2013 10:47 AM   GFRAA 82 06/01/2020 03:14 PM   GFRAA >60 05/14/2014 09:56 AM   GFRAA >60 04/04/2013 10:47 AM    BMP Latest Ref Rng & Units 06/01/2020 09/18/2019 06/24/2019  Glucose 65 - 99 mg/dL 124(H) 126(H) 186(H)  BUN 8 - 27 mg/dL 20 13 15   Creatinine 0.57 - 1.00 mg/dL 0.82 0.67 0.78  BUN/Creat Ratio 12 - 28 24 19 19   Sodium 134 - 144 mmol/L 141 144 138  Potassium 3.5 - 5.2 mmol/L 4.1 3.7 4.1  Chloride 96 - 106 mmol/L 98 106 97  CO2 20 - 29 mmol/L 24 17(L) 18(L)  Calcium 8.7 - 10.3 mg/dL 9.2 9.3 10.0    Current antihypertensive regimen:  o Furosemide 40 mg one tablet a day o Losartan 25 mg one tablet a day   How often are you checking your Blood Pressure? Once a day   Current home BP readings: 120/44   What recent interventions/DTPs have been made by any provider to improve Blood Pressure control since last CPP Visit: Patient states she has been taking her medication as directed.   Any recent hospitalizations or ED visits since last visit with CPP? No   What diet changes have been made to improve Blood Pressure Control?  o Patient states she does watch her salt intake.   What exercise is being done to improve your Blood Pressure Control?  o Patient states she is active.   Adherence Review: Is the patient currently on ACE/ARB medication? Yes  Does the patient have >5 day gap between last estimated fill dates? No   Comprehensive medication review performed; Spoke to patient regarding  cholesterol  Lipid Panel    Component Value Date/Time   CHOL 162 09/18/2019 1121   CHOL 200 04/04/2013 1047   TRIG 207 (H) 09/18/2019 1121   TRIG 191 04/04/2013 1047   HDL 47 09/18/2019 1121   HDL 45 04/04/2013 1047   LDLCALC 80 09/18/2019 1121   LDLCALC 117 (H) 04/04/2013 1047  10-year ASCVD risk score: The 10-year ASCVD risk score Mikey Bussing DC Jr., et al., 2013) is: 23.9%   Values used to calculate the score:     Age: 60 years     Sex: Female     Is Non-Hispanic African American: No     Diabetic: Yes     Tobacco smoker: No     Systolic Blood Pressure: 347 mmHg     Is BP treated: Yes     HDL Cholesterol: 47 mg/dL     Total Cholesterol: 162 mg/dL   Current antihyperlipidemic regimen:  o Atorvastatin 10 mg one tablet a day   Previous antihyperlipidemic medications tried: None   ASCVD risk enhancing conditions: age >67, DM and HTN    What recent interventions/DTPs have been made by any provider to improve Cholesterol control since last CPP Visit: Patient is aware to have all Medications and supplements available at time of appointment   Any recent hospitalizations or ED visits since last visit with CPP?No   What diet changes have been made to improve Cholesterol?  o Patient states she eats healthy / watches her fats/ sugars. o   What exercise is being done to improve Cholesterol?  o Patient states she is active.   Adherence Review: Does the patient have >5 day gap between last estimated fill dates? No    Follow-Up:  Pharmacist Review - Patient would like to have Dr. Alben Spittle office call her to discuss Covid -40 Vaccines . States she has not had any Covid  Vaccines.   Daron Offer CPP Notified  Judithann Sheen, [facility] Clinical Pharmacist Assistant [phone]

## 2020-08-25 ENCOUNTER — Other Ambulatory Visit: Payer: Self-pay | Admitting: Family Medicine

## 2020-08-25 DIAGNOSIS — G63 Polyneuropathy in diseases classified elsewhere: Secondary | ICD-10-CM

## 2020-08-25 DIAGNOSIS — E119 Type 2 diabetes mellitus without complications: Secondary | ICD-10-CM

## 2020-09-01 ENCOUNTER — Other Ambulatory Visit: Payer: Self-pay

## 2020-09-01 ENCOUNTER — Ambulatory Visit
Admission: RE | Admit: 2020-09-01 | Discharge: 2020-09-01 | Disposition: A | Discharge status: Home / Self Care | Payer: PPO | Source: Ambulatory Visit | Attending: Family Medicine | Admitting: Family Medicine

## 2020-09-01 DIAGNOSIS — Z1231 Encounter for screening mammogram for malignant neoplasm of breast: Secondary | ICD-10-CM | POA: Diagnosis not present

## 2020-09-08 ENCOUNTER — Ambulatory Visit: Payer: PPO

## 2020-09-09 NOTE — Progress Notes (Signed)
Subjective:   Teresa [name] is a 75 y.o. female who presents for Medicare Annual (Subsequent) preventive examination.  I connected with Teresa [name] today by telephone and verified that I am speaking with the correct person using two identifiers. Location patient: home Location provider: work Persons participating in the virtual visit: patient, provider.   I discussed the limitations, risks, security and privacy concerns of performing an evaluation and management service by telephone and the availability of in person appointments. I also discussed with the patient that there may be a patient responsible charge related to this service. The patient expressed understanding and verbally consented to this telephonic visit.    Interactive audio and video telecommunications were attempted between this provider and patient, however failed, due to patient having technical difficulties OR patient did not have access to video capability.  We continued and completed visit with audio only.   Review of Systems    N/A  Cardiac Risk Factors include: advanced age (>30mn, >>67women);diabetes mellitus;dyslipidemia;hypertension;obesity (BMI >30kg/m2)     Objective:    Today's Vitals   09/13/20 1004  PainSc: 7    There is no height or weight on file to calculate BMI.  Advanced Directives 09/13/2020 09/10/2019 05/17/2019 05/16/2019 09/27/2018 01/19/2017 08/28/2016  Does Patient Have a Medical Advance Directive? Yes Yes Yes Yes Yes Yes Yes  Type of [place]amed[place]of [place] will - Living will;Healthcare Power of Attorney Living will  Does patient want to make changes to medical advance directive? - - No - Patient declined - - - -  Copy of HK[place]sin Chart? Yes - validated most recent copy scanned in chart (See row information) Yes -  validated most recent copy scanned in chart (See row information) No - copy requested No - copy requested - No - copy requested -    Current Medications (verified) Outpatient Encounter Medications as of 09/13/2020  Medication Sig  . acetaminophen (TYLENOL) 500 MG tablet Take 500 mg by mouth every 6 (six) hours as needed for mild pain or headache.  . allopurinol (ZYLOPRIM) 100 MG tablet TAKE 1 TABLET BY MOUTH EVERY DAY  . atorvastatin (LIPITOR) 10 MG tablet TAKE 1 TABLET BY MOUTH EVERY DAY AT BEDTIME  . budesonide (PULMICORT) 0.5 MG/2ML nebulizer solution Take 0.5 mg by nebulization as needed.  . colchicine 0.6 MG tablet Take 0.6 mg by mouth daily as needed (for gout flare-ups).   . Continuous Blood Gluc Receiver (FREESTYLE LIBRE 2 READER) DEVI 1 each by Does not apply route continuous.  . Continuous Blood Gluc Sensor (FREESTYLE LIBRE 2 SENSOR) MISC Continuous monitoring  . furosemide (LASIX) 40 MG tablet TAKE 1 TABLET BY MOUTH EVERY DAY  . gabapentin (NEURONTIN) 600 MG tablet TAKE 1 TABLET BY MOUTH THREE TIMES A DAY  . glimepiride (AMARYL) 4 MG tablet TAKE 1 TABLET (4 MG TOTAL) BY MOUTH 2 (TWO) TIMES DAILY.  .Marland KitchenguaiFENesin-dextromethorphan (ROBITUSSIN DM) 100-10 MG/5ML syrup Take 5 mLs by mouth every 4 (four) hours as needed for cough.  .Marland KitchenJARDIANCE 25 MG TABS tablet TAKE 25 MG BY MOUTH DAILY.  .Marland Kitchenlevothyroxine (SYNTHROID) 88 MCG tablet TAKE 1 TABLET BY MOUTH EVERY DAY  . menthol-cetylpyridinium (CEPACOL) 3 MG lozenge Take 1 lozenge (3 mg total) by mouth as needed for sore throat.  . metFORMIN (GLUCOPHAGE) 1000 MG tablet TAKE 1 TABLET BY MOUTH TWICE A DAY  .  nystatin cream (MYCOSTATIN) Apply 1 application topically 2 (two) times daily. (Patient taking differently: Apply 1 application topically 2 (two) times daily as needed (for diabetic yeast reactions.).)  . ondansetron (ZOFRAN) 4 MG tablet TAKE 1 TABLET BY MOUTH EVERY 8 HOURS AS NEEDED FOR NAUSEA AND VOMITING  . OZEMPIC, 0.25 OR 0.5 MG/DOSE, 2  MG/1.5ML SOPN INJECT 0.5MG INTO THE SKIN ONCE A WEEK.  . pioglitazone (ACTOS) 30 MG tablet TAKE 1 TABLET BY MOUTH EVERY DAY  . benzonatate (TESSALON) 200 MG capsule Take 1 capsule (200 mg total) by mouth 3 (three) times daily. (Patient not taking: Reported on 09/13/2020)  . losartan (COZAAR) 25 MG tablet Take 1 tablet (25 mg total) by mouth daily. (Patient not taking: Reported on 09/13/2020)   No facility-administered encounter medications on file as of 09/13/2020.    Allergies (verified) Bacitracin-neomycin-polymyxin, Clarithromycin, Codeine, Dilaudid  [hydromorphone hcl], Iodine, Iohexol, Neomycin-bacitracin zn-polymyx, Tamiflu  [oseltamivir phosphate], Zolpidem, Benzalkonium chloride, Lidocaine hcl, Morphine, Tape, and Tapentadol   History: Past Medical History:  Diagnosis Date  . Complication of anesthesia   . Depression   . Diabetes mellitus without complication ([place])   . Dyspnea    DOE  . Edema    FEET/LEGS  . GERD (gastroesophageal reflux disease)   . Gout   . Headache   . Heart murmur   . History of hiatal hernia   . History of orthopnea   . Hypertension   . Hypothyroidism   . PONV (postoperative nausea and vomiting)   . 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 Sleep apnea    NO CPAP  . Thyroid disease   . Vertigo    Past Surgical History:  Procedure Laterality Date  . ABDOMINAL HYSTERECTOMY     ovaries intact  . BLADDER SURGERY     bladder tuck  . BREAST BIOPSY Right 03/20/2016   neg x 2 area  . CARDIAC CATHETERIZATION    . CARPAL TUNNEL RELEASE    . CATARACT EXTRACTION W/PHACO Left 09/17/2018   Procedure: CATARACT EXTRACTION PHACO AND INTRAOCULAR LENS PLACEMENT (IOC) LEFT, DIABETIC;  Surgeon: Birder Robson, MD;  Location: [facility] ORS;  Service: Ophthalmology;  Laterality: Left;  [place] 00:34 CDE 4.85 Fluid pack lot # [id number]  . CATARACT EXTRACTION W/PHACO Right 10/15/2018   Procedure: CATARACT EXTRACTION PHACO AND INTRAOCULAR LENS PLACEMENT (IOC)-RIGHT;  Surgeon: Birder Robson, MD;  Location: [facility] ORS;  Service: Ophthalmology;  Laterality: Right;  [place] 00:27.6 CDE 3.43 Fluid Pack Lot # [id number]  . COLONOSCOPY WITH PROPOFOL N/A 09/27/2018   Procedure: COLONOSCOPY WITH PROPOFOL;  Surgeon: Virgel Manifold, MD;  Location: [facility] ENDOSCOPY;  Service: Endoscopy;  Laterality: N/A;  . DILATION AND CURETTAGE OF UTERUS    . EYE SURGERY     eyelid  . FRACTURE SURGERY     left ankle-plate and screws palced  . LITHOTRIPSY    . NISSEN FUNDOPLICATION    . SHOULDER SURGERY Right   . TONSILLECTOMY     Family History  Problem Relation Age of Onset  . Stroke Mother   . Hypertension Mother   . Heart disease Mother   . Arthritis Mother   . Heart disease Father   . Hypertension Father   . Diabetes Sister   . Hypertension Sister   . Asthma Sister   . Hypertension Sister   . Diverticulitis Sister   . Colon cancer Maternal Grandmother   . Breast cancer Maternal Grandmother   . Breast cancer Maternal Aunt  Social History   Socioeconomic History  . Marital status: Divorced    Spouse name: Not on file  . Number of children: 2  . Years of education: Not on file  . Highest education level: Associate degree: occupational, Hotel manager, or vocational program  Occupational History  . Occupation: retired  Tobacco Use  . Smoking status: Never Smoker  . Smokeless tobacco: Never Used  Vaping Use  . Vaping Use: Never used  Substance and Sexual Activity  . Alcohol use: No    Alcohol/week: 0.0 standard drinks  . Drug use: Never  . Sexual activity: Not on file  Other Topics Concern  . Not on file  Social History Narrative  . Not on file   Social Determinants of Health   Financial Resource Strain: Medium Risk  . Difficulty of Paying Living Expenses: Somewhat hard  Food Insecurity: No Food Insecurity  . Worried About Charity fundraiser in the Last Year: Never true  . Ran Out of Food in the Last Year: Never true  Transportation Needs: No Transportation Needs   . Lack of Transportation (Medical): No  . Lack of Transportation (Non-Medical): No  Physical Activity: Sufficiently Active  . Days of Exercise per Week: 7 days  . Minutes of Exercise per Session: 30 min  Stress: Stress Concern Present  . Feeling of Stress : Very much  Social Connections: Moderately Integrated  . Frequency of Communication with Friends and Family: More than three times a week  . Frequency of Social Gatherings with Friends and Family: More than three times a week  . Attends Religious Services: More than 4 times per year  . Active Member of Clubs or Organizations: Yes  . Attends Archivist Meetings: More than 4 times per year  . Marital Status: Divorced    Tobacco Counseling Counseling given: Not Answered   Clinical Intake:  Pre-visit preparation completed: Yes  Pain : 0-10 Pain Score: 7  Pain Type: Chronic pain Pain Location: Chest (and lower back) Pain Descriptors / Indicators: Aching Pain Frequency: Constant Pain Relieving Factors: Takes Tylenol as needed for pain.  Pain Relieving Factors: Takes Tylenol as needed for pain.  Nutritional Risks: None Diabetes: Yes  How often do you need to have someone help you when you read instructions, pamphlets, or other written materials from your doctor or pharmacy?: 1 - Never  Diabetic? Yes  Nutrition Risk Assessment:  Has the patient had any N/V/D within the last 2 months?  No  Does the patient have any non-healing wounds?  No  Has the patient had any unintentional weight loss or weight gain?  No   Diabetes:  Is the patient diabetic?  Yes  If diabetic, was a CBG obtained today?  No  Did the patient bring in their glucometer from home?  No  How often do you monitor your CBG's? Twice a day.   Financial Strains and Diabetes  Management:  Are you having any financial strains with the device, your supplies or your medication? No .  Does the patient want to be seen by Chronic Care Management for  management of their diabetes?  No  Would the patient like to be referred to a Nutritionist or for Diabetic Management?  No   Diabetic Exams:  Diabetic Eye Exam: Completed 07/07/20 Diabetic Foot Exam: Completed 07/08/20   Interpreter Needed?: No  Information entered by :: Mmarkoski, LPN   Activities of Daily Living In your present state of health, do you have any difficulty performing  the following activities: 09/13/2020  Hearing? N  Vision? N  Difficulty concentrating or making decisions? N  Walking or climbing stairs? Y  Comment Due to SOB.  Dressing or bathing? N  Doing errands, shopping? N  Preparing Food and eating ? N  Using the Toilet? N  In the past six months, have you accidently leaked urine? N  Do you have problems with loss of bowel control? N  Managing your Medications? N  Managing your Finances? N  Housekeeping or managing your Housekeeping? N  Some recent data might be hidden    Patient Care Team: [name], MD as PCP - General (Family Medicine) Gardiner Barefoot, DPM as Consulting Physician (Podiatry) Yolonda Kida, MD as Consulting Physician (Cardiology) Vern Claude,  as Social Worker Birder Robson, MD as Referring Physician (Ophthalmology) Ottie Glazier, MD as Consulting Physician (Pulmonary Disease) Germaine Pomfret, [facility] (Pharmacist) Melrose Nakayama, MD as Consulting Physician (Orthopedic Surgery)  Indicate any recent Medical Services you may have received from other than [facility] providers in the past year (date may be approximate).     Assessment:   This is a routine wellness examination for Teresa [name].  Hearing/Vision screen No exam data present  Dietary issues and exercise activities discussed: Current Exercise Habits: The patient does not participate in regular exercise at present;Home exercise routine, Type of exercise: walking, Time (Minutes): 30, Frequency (Times/Week): 7, Weekly Exercise (Minutes/Week): 210, Intensity:  Mild, Exercise limited by: None identified  Goals      Patient Stated   .  "I need to move out of my home by Thursday" completed (pt-stated)      CARE PLAN ENTRY (see longitudinal plan of care for additional care plan information)  Current Barriers:  . Housing barriers  Clinical Social Work Clinical Goal(s):  Marland Kitchen Over the next 90 days, patient will work with [employer] to address needs related to Housing  Interventions: . Patient continues to confirm that he has now moved in with her son and daughter in law and will remain there until permanent housing is identified . Patient continues to stated that she is #1 on the waiting list for [place] . Phone call made to [facility] to confirm two openings . This Education officer, museum discussed openings for  [employer] and confirmed that they have 2 additional openings and encouraged patient to contact Teresa [name] at [facility] to discuss application process-patient agreed to do sor . Discussed plans with patient for ongoing care management follow up and provided patient with direct contact information for care management team if needed in the future.   Patient Self Care Activities:  . Performs ADL's independently . Calls provider office for new concerns or questions . Knowledge deficit related to Housing Needs  Please see past updates related to this goal by clicking on the "Past Updates" button in the selected goal      .  Diabetes Mellitus - goal A1c < 7% (pt-stated)      CARE PLAN ENTRY (see longitudinal plan of care for additional care plan information)  Current Barriers:  . Diabetes: Type 2; complicated by chronic medical conditions including HF, pulmonary HTN, hypothyroidism Lab Results  Component Value Date   HGBA1C 7.4 (H) 09/18/2019 .   Lab Results  Component Value Date   CREATININE 0.67 09/18/2019   CREATININE 0.78 06/24/2019   CREATININE 0.56 05/27/2019 .   Marland Kitchen No results found for:  EGFR . Current antihyperglycemic regimen: Ozempic, Jardiance, metformin . Denies  hyperglycemic symptoms . Current exercise: unable to walk due to neuropathy . 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 Comprehensive medication review performed, medication list updated in electronic medical record . Inter-disciplinary care team collaboration (see longitudinal plan of care) . Increased gabapentin 411m additional when needed  Patient Self Care Activities:  . Patient will check blood glucose daily , document, and provide at future appointments . Patient will take medications as prescribed . Patient will contact provider with any episodes of hypoglycemia . Patient will increase exercise over next 90 days after neuropathic pain reduced  Initial goal documentation     .  Hypertension - Goal BP < 140/90 (pt-stated)      CARE PLAN ENTRY (see longitudinal plan of care for additional care plan information)  Current Barriers:  . Uncontrolled hypertension, complicated by Diabetes, pain . Current antihypertensive regimen: losartan, (Jardiance) . Previous antihypertensives tried: NA . Last practice recorded BP readings:  BP Readings from Last 3 Encounters:  09/18/19 116/71  07/15/19 104/64  05/27/19 126/66 .   .Marland KitchenCurrent home BP readings: 120/76 . Most recent eGFR/CrCl: No results found for: EGFR  No components found for: CRCL  Pharmacist Clinical Goal(s):  .Marland KitchenOver the next 90 days, patient will work with PharmD and providers to continue antihypertensive regimen  Interventions: . Inter-disciplinary care team collaboration (see longitudinal plan of care) . Comprehensive medication review performed; medication list updated in the electronic medical record.  .  Patient Self Care Activities:  . Patient will continue to check BP daily ,  document, and provide at future appointments . Patient will focus on medication adherence by continuing current regimen  Initial goal documentation       Other   .  Prevent falls      Recommend to remove any items from the home that may cause slips or trips.    .  Weight (lb) < 140 lb (63.5 kg)      Recommend to continue to monitor food intake and keep walking daily to help aid with weight loss and control diabetes.       Depression Screen PHQ 2/9 Scores 09/13/2020 09/10/2019 01/14/2019 09/12/2018 01/19/2017 01/19/2017 12/08/2015  PHQ - 2 Score 6 0 1 1 0 0 2  PHQ- 9 Score 15 - - '5 4 4 5    ' Fall Risk Fall Risk  09/13/2020 09/10/2019 09/12/2018 05/20/2018 01/19/2017  Falls in the past year? '1 1 1 ' Yes Yes  Number falls in past yr: '1 1 1 2 ' or more 2 or more  Comment - - - - -  Injury with Fall? 0 0 0 No No  Risk Factor Category  - - - - -  Comment - - - - -  Risk for fall due to : Other (Comment) Other (Comment) History of fall(s);Impaired balance/gait - -  Risk for fall due to: Comment Due to HR dropping post Covid. Due to low heart rate post covid-19. - - -  Follow up Falls prevention discussed Falls prevention discussed Falls evaluation completed;Education provided - (No Data)  Comment - - Patient has been being followed by neurology - currently being evaluated by Dr PMelrose Nakayama   FALL RISK PREVENTION PERTAINING TO THE HOME:  Any stairs in or around the home? No  If so, are there any without handrails? No  Home free of loose throw  rugs in walkways, pet beds, electrical cords, etc? Yes  Adequate lighting in your home to reduce risk of falls? Yes   ASSISTIVE DEVICES UTILIZED TO PREVENT FALLS:  Life alert? No  Use of a cane, walker or w/c? Yes  Grab bars in the bathroom? No  Shower chair or bench in shower? No  Elevated toilet seat or a handicapped toilet? No    Cognitive Function: Normal cognitive status assessed by observation by this Nurse Health Advisor. No abnormalities found.        6CIT Screen 01/19/2017  What Year? 0 points  What month? 0 points  What time? 0 points  Count back from 20 0 points  Months in reverse 0 points  Repeat phrase 0 points  Total Score 0    Immunizations Immunization History  Administered Date(s) Administered  . Fluad Quad(high Dose 65+) 05/27/2019, 06/01/2020  . Influenza, High Dose Seasonal PF 05/29/2016, 06/06/2017, 05/20/2018  . Pneumococcal Conjugate-13 05/21/2014  . Pneumococcal Polysaccharide-23 11/29/2004, 07/11/2010, 12/08/2015, 05/27/2019  . Td 07/05/1994  . Tdap 05/11/2011  . Zoster 05/07/2008    TDAP status: Up to date  Flu Vaccine status: Up to date  Pneumococcal vaccine status: Up to date  Covid-19 vaccine status: Information provided on how to obtain vaccines.   Qualifies for Shingles Vaccine? Yes   Zostavax completed Yes   Shingrix Completed?: No.    Education has been provided regarding the importance of this vaccine. Patient has been advised to call insurance company to determine out of pocket expense if they have not yet received this vaccine. Advised may also receive vaccine at local pharmacy or Health Dept. Verbalized acceptance and understanding.  Screening Tests Health Maintenance  Topic Date Due  . COVID-19 Vaccine (1) Never done  . HEMOGLOBIN A1C  11/30/2020  . TETANUS/TDAP  05/10/2021  . OPHTHALMOLOGY EXAM  07/07/2021  . FOOT EXAM  07/08/2021  . COLONOSCOPY (Pts 45-58yr Insurance coverage will need to be confirmed)  09/28/2023  . INFLUENZA VACCINE  Completed  . DEXA SCAN  Completed  . Hepatitis C Screening  Completed  . PNA vac Low Risk Adult  Completed    Health Maintenance  Health Maintenance Due  Topic Date Due  . COVID-19 Vaccine (1) Never done    Colorectal cancer screening: Type of screening: Colonoscopy. Completed 09/27/18. Repeat every 5 years  Mammogram status: Completed 09/01/20. Repeat every year  Bone Density status: Completed 02/24/08. Results reflect: Previous DEXA scan  was normal. No repeat needed unless advised by a physician.  Lung Cancer Screening: (Low Dose CT Chest recommended if Age 75-80years, 30 pack-year currently smoking OR have quit w/in 15years.) does not qualify.   Additional Screening:  Hepatitis C Screening: Up to date  Vision Screening: Recommended annual ophthalmology exams for early detection of glaucoma and other disorders of the eye. Is the patient up to date with their annual eye exam?  Yes  Who is the provider or what is the name of the office in which the patient attends annual eye exams? Dr PGeorge InaIf pt is not established with a provider, would they like to be referred to a provider to establish care? No .   Dental Screening: Recommended annual dental exams for proper oral hygiene  Community Resource Referral / Chronic Care Management: CRR required this visit?  No   CCM required this visit?  No      Plan:     I have personally reviewed and noted the following in the patient's chart:   .  Medical and social history . Use of alcohol, tobacco or illicit drugs  . Current medications and supplements . Functional ability and status . Nutritional status . Physical activity . Advanced directives . List of other physicians . Hospitalizations, surgeries, and ER visits in previous 12 months . Vitals . Screenings to include cognitive, depression, and falls . Referrals and appointments  In addition, I have reviewed and discussed with patient certain preventive protocols, quality metrics, and best practice recommendations. A written personalized care plan for preventive services as well as general preventive health recommendations were provided to patient.     Jalayla Chrismer [place], [place]   0/11/2471   Nurse Notes: Pt to discuss receiving a future Covid vaccine with PCP at [facility] in office apt.

## 2020-09-10 ENCOUNTER — Telehealth: Payer: Self-pay

## 2020-09-10 NOTE — Progress Notes (Signed)
Unable to leave voice message to confirmed patient telephone appointment on 09/13/2020 for CCM at 4:00 pm with [name] the Clinical pharmacist.   [place] Pharmacist Assistant [phone]

## 2020-09-12 ENCOUNTER — Telehealth: Payer: Self-pay | Admitting: Family Medicine

## 2020-09-13 ENCOUNTER — Other Ambulatory Visit: Payer: Self-pay

## 2020-09-13 ENCOUNTER — Ambulatory Visit (INDEPENDENT_AMBULATORY_CARE_PROVIDER_SITE_OTHER): Payer: PPO

## 2020-09-13 DIAGNOSIS — Z0000 Encounter for general adult medical examination without abnormal findings: Secondary | ICD-10-CM | POA: Diagnosis not present

## 2020-09-13 DIAGNOSIS — E1140 Type 2 diabetes mellitus with diabetic neuropathy, unspecified: Secondary | ICD-10-CM

## 2020-09-13 DIAGNOSIS — I10 Essential (primary) hypertension: Secondary | ICD-10-CM | POA: Diagnosis not present

## 2020-09-13 NOTE — Progress Notes (Signed)
Established patient visit   Patient: Teresa [name]   DOB: 05/18/1946   75 y.o. Female  MRN: [id number] Visit Date: 09/14/2020  Today's healthcare provider: Wilhemena Durie, MD   No chief complaint on file.  Subjective    HPI  Patient comes in today for follow-up.  He is feeling physically better but has had a tough time since having Covid in 2020.  He had a severe infection and is just now physically recovering.  She has been unable to work since then and even at her age plan to continue working.  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 Diabetes Mellitus Type II, follow-up  Lab Results  Component Value Date   HGBA1C 6.9 (A) 09/14/2020   HGBA1C 6.9 (A) 06/01/2020   HGBA1C 7.4 (A) 01/19/2020   Last seen for diabetes 4 months ago.  Management since then includes; With decreasing A1c will stop glimepiride to lower risk of ongoing hypoglycemia. She reports good compliance with treatment. She is not having side effects.   Home blood sugar records: fasting range: 150s  Episodes of hypoglycemia? No    Current insulin regiment: none Most Recent Eye Exam:   Hypertension, follow-up  BP Readings from Last 3 Encounters:  09/14/20 106/62  06/01/20 97/68  01/19/20 100/68   Wt Readings from Last 3 Encounters:  09/14/20 192 lb (87.1 kg)  06/01/20 197 lb (89.4 kg)  01/19/20 202 lb 12.8 oz (92 kg)     She was last seen for hypertension 4 months ago.  BP at that visit was 97/67. Management since that visit includes; Patient hypotensive today, will stop losartan.  Follow-up early 2022. She reports good compliance with treatment. She is not having side effects.  She is not exercising. She is adherent to low salt diet.   Outside blood pressures are checked occasionally.  She does not smoke.  Use of agents associated with  hypertension: none.    Lipid/Cholesterol, follow-up  Last Lipid Panel: Lab Results  Component Value Date   CHOL 162 09/18/2019   LDLCALC 80 09/18/2019   HDL 47 09/18/2019   TRIG 207 (H) 09/18/2019    She was last seen for this 1 years ago.  Management since that visit includes; on atorvastatin.  She reports good compliance with treatment. She is not having side effects.  She is following a Regular diet. Current exercise: no regular exercise  Last metabolic panel Lab Results  Component Value Date   GLUCOSE 124 (H) 06/01/2020   NA 141 06/01/2020   K 4.1 06/01/2020   BUN 20 06/01/2020   CREATININE 0.82 06/01/2020   GFRNONAA 71 06/01/2020   GFRAA 82 06/01/2020   CALCIUM 9.2 06/01/2020   AST 27 09/18/2019   ALT 24 09/18/2019   The 10-year ASCVD risk score Mikey Bussing DC Jr., et al., 2013) is: 26.3%   History of 2019 novel coronavirus disease (COVID-19) From 06/01/2020-Still having some chronic fatigue from this.       Medications: Outpatient Medications Prior to Visit  Medication Sig  . acetaminophen (TYLENOL) 500 MG tablet Take 500 mg by mouth every 6 (six) hours as needed for mild pain or headache.  . allopurinol (ZYLOPRIM) 100 MG tablet TAKE 1 TABLET BY MOUTH EVERY DAY  . atorvastatin (LIPITOR) 10 MG tablet TAKE 1 TABLET BY MOUTH EVERY DAY  AT BEDTIME  . budesonide (PULMICORT) 0.5 MG/2ML nebulizer solution Take 0.5 mg by nebulization as needed.  . colchicine 0.6 MG tablet Take 0.6 mg by mouth daily as needed (for gout flare-ups).   . Continuous Blood Gluc Receiver (FREESTYLE LIBRE 2 READER) DEVI 1 each by Does not apply route continuous.  . Continuous Blood Gluc Sensor (FREESTYLE LIBRE 2 SENSOR) MISC Continuous monitoring  . furosemide (LASIX) 40 MG tablet TAKE 1 TABLET BY MOUTH EVERY DAY  . gabapentin (NEURONTIN) 600 MG tablet TAKE 1 TABLET BY MOUTH THREE TIMES A DAY  . glimepiride (AMARYL) 4 MG tablet TAKE 1 TABLET (4 MG TOTAL) BY MOUTH 2 (TWO) TIMES DAILY.  Marland Kitchen  JARDIANCE 25 MG TABS tablet TAKE 25 MG BY MOUTH DAILY.  Marland Kitchen levothyroxine (SYNTHROID) 88 MCG tablet TAKE 1 TABLET BY MOUTH EVERY DAY  . menthol-cetylpyridinium (CEPACOL) 3 MG lozenge Take 1 lozenge (3 mg total) by mouth as needed for sore throat.  . metFORMIN (GLUCOPHAGE) 1000 MG tablet TAKE 1 TABLET BY MOUTH TWICE A DAY  . nystatin cream (MYCOSTATIN) Apply 1 application topically 2 (two) times daily. (Patient taking differently: Apply 1 application topically 2 (two) times daily as needed (for diabetic yeast reactions.).)  . ondansetron (ZOFRAN) 4 MG tablet TAKE 1 TABLET BY MOUTH EVERY 8 HOURS AS NEEDED FOR NAUSEA AND VOMITING  . OZEMPIC, 0.25 OR 0.5 MG/DOSE, 2 MG/1.5ML SOPN INJECT 0.5MG  INTO THE SKIN ONCE A WEEK.  . pioglitazone (ACTOS) 30 MG tablet TAKE 1 TABLET BY MOUTH EVERY DAY  . benzonatate (TESSALON) 200 MG capsule Take 1 capsule (200 mg total) by mouth 3 (three) times daily.  Marland Kitchen guaiFENesin-dextromethorphan (ROBITUSSIN DM) 100-10 MG/5ML syrup Take 5 mLs by mouth every 4 (four) hours as needed for cough. (Patient not taking: Reported on 09/14/2020)  . losartan (COZAAR) 25 MG tablet Take 1 tablet (25 mg total) by mouth daily. (Patient not taking: No sig reported)   No facility-administered medications prior to visit.    Review of Systems  Constitutional: Negative for appetite change, chills, fatigue and fever.  Respiratory: Negative for chest tightness and shortness of breath.   Cardiovascular: Negative for chest pain and palpitations.  Gastrointestinal: Negative for abdominal pain, nausea and vomiting.  Neurological: Negative for dizziness and weakness.    Last hemoglobin A1c Lab Results  Component Value Date   HGBA1C 6.9 (A) 09/14/2020       Objective    BP 106/62   Pulse 70   Temp 98.2 F (36.8 C)   Resp 16   Wt 192 lb (87.1 kg)   BMI 35.12 kg/m  BP Readings from Last 3 Encounters:  09/14/20 106/62  06/01/20 97/68  01/19/20 100/68   Wt Readings from Last 3 Encounters:   09/14/20 192 lb (87.1 kg)  06/01/20 197 lb (89.4 kg)  01/19/20 202 lb 12.8 oz (92 kg)       Physical Exam Vitals reviewed.  Constitutional:      Appearance: She is obese.  HENT:     Head: Normocephalic and atraumatic.     Right Ear: External ear normal.     Left Ear: External ear normal.  Eyes:     General: No scleral icterus.    Conjunctiva/sclera: Conjunctivae normal.  Cardiovascular:     Rate and Rhythm: Normal rate and regular rhythm.     Heart sounds: Normal heart sounds.  Pulmonary:     Breath sounds: Normal breath sounds.  Abdominal:     General: Bowel sounds are  normal. There is no distension.     Palpations: Abdomen is soft.     Tenderness: There is no abdominal tenderness. There is no guarding.  Musculoskeletal:     Right lower leg: No edema.     Left lower leg: No edema.  Skin:    General: Skin is warm and dry.  Neurological:     General: No focal deficit present.     Mental Status: She is alert and oriented to person, place, and time.  Psychiatric:        Mood and Affect: Mood normal.        Behavior: Behavior normal.        Thought Content: Thought content normal.        Judgment: Judgment normal.       Results for orders placed or performed in visit on 09/14/20  POCT glycosylated hemoglobin (Hb A1C)  Result Value Ref Range   Hemoglobin A1C 6.9 (A) 4.0 - 5.6 %   HbA1c POC (<> result, manual entry)     HbA1c, POC (prediabetic range)     HbA1c, POC (controlled diabetic range)      Assessment & Plan     1. Type 2 diabetes mellitus with diabetic neuropathy, without long-term current use of insulin (HCC) A1c under good control with 6.9 today.  No changes in medication.  May have to stop pioglitazone patient does not have a history of heart failure - POCT glycosylated hemoglobin (Hb A1C)  2. Essential hypertension Good control.  Watch for hypotension - Comprehensive metabolic panel - CBC with Differential/Platelet  3. Acquired  hypothyroidism  - TSH  4. Pure hypercholesterolemia  - Lipid panel  5. Need for COVID-19 vaccine  - [employer] Vaccine  6. Coronary artery disease involving native coronary artery of native heart without angina pectoris All risk factors treated  7. Hypertensive heart disease without heart failure   8. Pneumonia due to COVID-19 virus She had severe Covid pneumonia back in 2020   No follow-ups on file.      I, Wilhemena Durie, MD, have reviewed all documentation for this visit. The documentation on 09/19/20 for the exam, diagnosis, procedures, and orders are all accurate and complete.    Ysmael Hires Cranford Mon, MD  [facility] [phone] (phone) [phone] (fax)  [place]

## 2020-09-13 NOTE — Progress Notes (Signed)
Chronic Care Management Pharmacy Note  09/15/2020 Name:  Teresa [name] MRN:  [id number] DOB:  Jan 15, 1946  Subjective: DORATHEA [name] is an 75 y.o. year old female who is a primary patient of Teresa [name]., MD.  The CCM team was consulted for assistance with disease management and care coordination needs.    Engaged with patient by telephone for follow up visit in response to provider referral for pharmacy case management and/or care coordination services.   Consent to Services:  The patient was given the following information about Chronic Care Management services today, agreed to services, and gave verbal consent: 1. CCM service includes personalized support from designated clinical staff supervised by the primary care provider, including individualized plan of care and coordination with other care providers 2. 24/7 contact phone numbers for assistance for urgent and routine care needs. 3. Service will only be billed when office clinical staff spend 20 minutes or more in a month to coordinate care. 4. Only one practitioner may furnish and bill the service in a calendar month. 5.The patient may stop CCM services at any time (effective at the end of the month) by phone call to the office staff. 6. The patient will be responsible for cost sharing (co-pay) of up to 20% of the service fee (after annual deductible is met). Patient agreed to services and consent obtained.  Patient Care Team: Teresa [name]., MD as PCP - General (Family Medicine) Teresa [name], DPM as Consulting Physician (Podiatry) Teresa Kida, MD as Consulting Physician (Cardiology) Teresa [name], [place] as Social Worker Teresa Robson, MD as Referring Physician (Ophthalmology) Teresa Glazier, MD as Consulting Physician (Pulmonary Disease) Teresa [name], [facility] (Pharmacist) Melrose Nakayama, MD as Consulting Physician (Orthopedic Surgery)  Recent office visits: 09/13/20:  Patient presented to  [facility], LPN for AWV.   06/02/24: Patient presented to Dr. Rosanna Randy for follow-up. A1c improved to 6.9%. Losartan stopped due to hypotension in clinic.   Recent consult visits: None noted.   Objective:  Lab Results  Component Value Date   CREATININE 0.78 09/14/2020   BUN 19 09/14/2020   GFRNONAA 75 09/14/2020   GFRAA 86 09/14/2020   NA 146 (H) 09/14/2020   K 4.2 09/14/2020   CALCIUM 9.9 09/14/2020   CO2 22 09/14/2020    Lab Results  Component Value Date/Time   HGBA1C 6.9 (A) 09/14/2020 11:09 AM   HGBA1C 6.9 (A) 06/01/2020 12:15 PM   HGBA1C 7.4 (H) 09/18/2019 11:21 AM   HGBA1C 8.4 (H) 05/17/2019 03:05 AM   HGBA1C 7.6 (H) 04/04/2013 10:47 AM   MICROALBUR 50 06/06/2017 11:26 AM   MICROALBUR 20 12/08/2015 10:39 AM    Last diabetic Eye exam:  Lab Results  Component Value Date/Time   HMDIABEYEEXA No Retinopathy 07/07/2020 12:00 AM    Last diabetic Foot exam: No results found for: HMDIABFOOTEX   Lab Results  Component Value Date   CHOL 153 09/14/2020   HDL 53 09/14/2020   LDLCALC 73 09/14/2020   TRIG 160 (H) 09/14/2020   CHOLHDL 2.9 09/14/2020    Hepatic Function Latest Ref Rng & Units 09/14/2020 06/01/2020 09/18/2019  Total Protein 6.0 - 8.5 g/dL 7.7 - 7.2  Albumin 3.7 - 4.7 g/dL 4.7 4.5 4.6  AST 0 - 40 IU/L 24 - 27  ALT 0 - 32 IU/L 25 - 24  Alk Phosphatase 44 - 121 IU/L 87 - 79  Total Bilirubin 0.0 - 1.2 mg/dL 0.5 - 0.4  Bilirubin, Direct 0.0 -  0.2 mg/dL - - -    Lab Results  Component Value Date/Time   TSH 1.250 09/14/2020 01:03 PM   TSH 1.460 06/01/2020 03:14 PM    CBC Latest Ref Rng & Units 09/14/2020 09/18/2019 06/24/2019  WBC 3.4 - 10.8 x10E3/uL 10.1 8.5 9.3  Hemoglobin 11.1 - 15.9 g/dL 16.1(H) 15.3 14.4  Hematocrit 34.0 - 46.6 % 48.8(H) 45.0 44.5  Platelets 150 - 450 x10E3/uL 267 239 318    No results found for: VD25OH  Clinical ASCVD: Yes  The 10-year ASCVD risk score Teresa [name] DC Jr., et al., 2013) is: 26.3%   Values used to calculate the  score:     Age: 12 years     Sex: Female     Is Non-Hispanic African American: No     Diabetic: Yes     Tobacco smoker: No     Systolic Blood Pressure: 321 mmHg     Is BP treated: Yes     HDL Cholesterol: 53 mg/dL     Total Cholesterol: 153 mg/dL    Depression screen [facility] 2/9 09/13/2020 09/10/2019 01/14/2019  Decreased Interest 3 0 0  Down, Depressed, Hopeless 3 0 1  PHQ - 2 Score 6 0 1  Altered sleeping 3 - -  Tired, decreased energy 3 - -  Change in appetite 3 - -  Feeling bad or failure about yourself  0 - -  Trouble concentrating 0 - -  Moving slowly or fidgety/restless 0 - -  Suicidal thoughts 0 - -  PHQ-9 Score 15 - -  Difficult doing work/chores Not difficult at all - -  Some recent data might be hidden     Social History   Tobacco Use  Smoking Status Never Smoker  Smokeless Tobacco Never Used   BP Readings from Last 3 Encounters:  09/14/20 106/62  06/01/20 97/68  01/19/20 100/68   Pulse Readings from Last 3 Encounters:  09/14/20 70  06/01/20 67  01/19/20 69   Wt Readings from Last 3 Encounters:  09/14/20 192 lb (87.1 kg)  06/01/20 197 lb (89.4 kg)  01/19/20 202 lb 12.8 oz (92 kg)    Assessment/Interventions: Review of patient past medical history, allergies, medications, health status, including review of consultants reports, laboratory and other test data, was performed as part of comprehensive evaluation and provision of chronic care management services.   SDOH:  (Social Determinants of Health) assessments and interventions performed: SDOH Interventions   Flowsheet Row Most Recent Value  SDOH Interventions   Financial Strain Interventions Intervention Not Indicated  [Patient does not qualify for LIS]  Transportation Interventions Intervention Not Indicated      CCM Care Plan  Allergies  Allergen Reactions  . Bacitracin-Neomycin-Polymyxin   . Clarithromycin Other (See Comments), Nausea Only and Nausea And Vomiting  . Codeine     Other reaction(s):  Hallucination  . Dilaudid  [Hydromorphone Hcl] Nausea And Vomiting  . Iodine   . Iohexol      Desc: PT HAS HIVES/ITCHING   . Neomycin-Bacitracin Zn-Polymyx   . Tamiflu  [Oseltamivir Phosphate]     Other reaction(s): Abdominal Pain, Vomiting  . Zolpidem Nausea And Vomiting and Other (See Comments)    Hallucinations  . Benzalkonium Chloride Itching, Rash and Swelling  . Lidocaine Hcl Itching, Rash and Swelling  . Morphine Nausea Only, Rash and Nausea And Vomiting  . Tape Itching and Rash    Adhesive tape - silicone  . Tapentadol Rash    Medications Reviewed Today  Reviewed by Wilder Glade, CMA (Certified Medical Assistant) on 09/14/20 at 102  Med List Status: <None>  Medication Order Taking? Sig Documenting Provider Last Dose Status Informant  acetaminophen (TYLENOL) 500 MG tablet [id number] Yes Take 500 mg by mouth every 6 (six) hours as needed for mild pain or headache. [provider] Taking Active   allopurinol (ZYLOPRIM) 100 MG tablet [id number] Yes TAKE 1 TABLET BY MOUTH EVERY DAY Teresa [name]., MD Taking Active   atorvastatin (LIPITOR) 10 MG tablet [id number] Yes TAKE 1 TABLET BY MOUTH EVERY DAY AT BEDTIME Teresa [name]., MD Taking Active   benzonatate (TESSALON) 200 MG capsule [id number]  Take 1 capsule (200 mg total) by mouth 3 (three) times daily. Patrecia Pour, MD  Consider Medication Status and Discontinue (Completed Course)   budesonide (PULMICORT) 0.5 MG/2ML nebulizer solution [id number] Yes Take 0.5 mg by nebulization as needed. [provider] Taking Active   colchicine 0.6 MG tablet [id number] Yes Take 0.6 mg by mouth daily as needed (for gout flare-ups).  [provider] Taking Active Self  Continuous Blood Gluc Receiver (FREESTYLE LIBRE 2 READER) DEVI [id number] Yes 1 each by Does not apply route continuous. Teresa [name]., MD Taking Active   Continuous Blood Gluc Sensor ([address] [place] 2 SENSOR) [place] [id number] Yes  Continuous monitoring Teresa [name]., MD Taking Active   furosemide (LASIX) 40 MG tablet [id number] Yes TAKE 1 TABLET BY MOUTH EVERY DAY Teresa [name]., MD Taking Active   gabapentin (NEURONTIN) 600 MG tablet [id number] Yes TAKE 1 TABLET BY MOUTH THREE TIMES A DAY Teresa [name]., MD Taking Active   glimepiride (AMARYL) 4 MG tablet [id number] Yes TAKE 1 TABLET (4 MG TOTAL) BY MOUTH 2 (TWO) TIMES DAILY. Teresa [name]., MD Taking Active   guaiFENesin-dextromethorphan [facility] DM) 100-10 MG/5ML syrup [id number] No Take 5 mLs by mouth every 4 (four) hours as needed for cough.  Patient not taking: Reported on 09/14/2020   Patrecia Pour, MD Not Taking Active   JARDIANCE 25 MG TABS tablet [id number] Yes TAKE 25 MG BY MOUTH DAILY. Teresa [name]., MD Taking Active   levothyroxine (SYNTHROID) 88 MCG tablet [id number] Yes TAKE 1 TABLET BY MOUTH EVERY DAY Teresa [name]., MD Taking Active   losartan (COZAAR) 25 MG tablet [id number] No Take 1 tablet (25 mg total) by mouth daily.  Patient not taking: No sig reported   Teresa [name]., MD Not Taking Active   menthol-cetylpyridinium (CEPACOL) 3 MG lozenge [id number] Yes Take 1 lozenge (3 mg total) by mouth as needed for sore throat. Patrecia Pour, MD Taking Active   metFORMIN (GLUCOPHAGE) 1000 MG tablet [id number] Yes TAKE 1 TABLET BY MOUTH TWICE A DAY Teresa [name]., MD Taking Active   nystatin cream (MYCOSTATIN) [id number] Yes Apply 1 application topically 2 (two) times daily.  Patient taking differently: Apply 1 application topically 2 (two) times daily as needed (for diabetic yeast reactions.).   Teresa [name]., MD Taking Active   ondansetron [facility]) 4 MG tablet [id number] Yes TAKE 1 TABLET BY MOUTH EVERY 8 HOURS AS NEEDED FOR NAUSEA AND VOMITING Teresa [name]., MD Taking Active   OZEMPIC, 0.25 OR 0.5 MG/DOSE, 2 MG/1.5ML SOPN [id number] Yes INJECT 0.5MG INTO THE SKIN ONCE A WEEK. Teresa [name]., MD Taking Active   pioglitazone (ACTOS) 30 MG tablet [id number] Yes TAKE 1 TABLET BY MOUTH EVERY DAY  Teresa [name]., MD Taking Active           Patient Active Problem List   Diagnosis Date Noted  . Oxygen desaturation 06/24/2019  . Encounter for examination following treatment at hospital 06/24/2019  . History of 2019 novel coronavirus disease (COVID-19) 06/24/2019  . Pneumonia due to COVID-19 virus 05/16/2019  . Intractable nausea and vomiting 05/16/2019  . Chronic diastolic CHF (congestive heart failure) ([place]) 05/16/2019  . Encounter for screening colonoscopy   . Benign neoplasm of cecum   . Diverticulosis of large intestine without diverticulitis   . Tortuous colon   . Polyp of sigmoid colon   . Intestinal lump   . Polyneuropathy associated with underlying disease ([place]) 12/11/2016  . Microcalcifications of the breast 12/15/2015  . Back pain, chronic 06/28/2015  . Chronic airway obstruction ([place]) 06/28/2015  . Panlobular emphysema ([place]) 09/10/2014  . Essential hypertension 06/21/2009  . CAD (coronary artery disease) 06/21/2009  . FEVER UNSPECIFIED 06/21/2009  . NAUSEA 06/21/2009  . DYSPHAGIA 06/21/2009  . RUQ PAIN 06/21/2009  . HIATAL HERNIA 01/07/2008  . Hypothyroidism 11/22/2007  . ANXIETY 11/22/2007  . DEPRESSION 11/22/2007  . Hypertensive heart disease without heart failure 11/22/2007  . 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 RENAL CALCULUS 11/22/2007  . Osteoarthritis 11/22/2007  . SLEEP APNEA 11/22/2007  . HEADACHE, CHRONIC 11/22/2007  . GASTRITIS 10/23/2007  . DIVERTICULOSIS, COLON 10/23/2007  . Diabetes mellitus type II, non insulin dependent ([place]) 07/16/2007  . HYPERCHOLESTEROLEMIA 07/16/2007  . OBESITY, MODERATE 07/16/2007  . ISCHEMIC HEART DISEASE 07/16/2007  . ESOPHAGEAL REFLUX 07/16/2007  . SCLERODERMA 07/16/2007  . DYSPNEA 07/16/2007  . COUGH, CHRONIC 07/16/2007    Immunization History  Administered Date(s) Administered  . Fluad  Quad(high Dose 65+) 05/27/2019, 06/01/2020  . Influenza, High Dose Seasonal PF 05/29/2016, 06/06/2017, 05/20/2018  . PFIZER(Purple Top)SARS-COV-2 Vaccination 09/14/2020  . Pneumococcal Conjugate-13 05/21/2014  . Pneumococcal Polysaccharide-23 11/29/2004, 07/11/2010, 12/08/2015, 05/27/2019  . Td 07/05/1994  . Tdap 05/11/2011  . Zoster 05/07/2008    Conditions to be addressed/monitored:  CAD, HTN, DMII, Anxiety, Depression and Hypothyoridism, and Chronic Pain  Care Plan : General Pharmacy (Adult)  Updates made by Teresa [name], RPH since 09/15/2020 12:00 AM    Problem: CAD, HTN, DMII, Anxiety, Depression and Hypothyoridism, and Chronic Pain   Priority: High    Long-Range Goal: Patient-Specific Goal   Start Date: 09/15/2020  Expected End Date: 03/15/2021  This Visit's Progress: On track  Priority: High  Note:   Current Barriers:  . No barriers noted   Pharmacist Clinical Goal(s):  Marland Kitchen Over the next 90 days, patient will maintain control of Diabetes as evidenced by A1c less than 8%  through collaboration with PharmD and provider.   Interventions: . 1:1 collaboration with Teresa [name]., MD regarding development and update of comprehensive plan of care as evidenced by provider attestation and co-signature . Inter-disciplinary care team collaboration (see longitudinal plan of care) . Comprehensive medication review performed; medication list updated in electronic medical record  Hypertension (BP goal <140/90) -controlled -Current treatment: . Furosemide 40 mg daily  -Medications previously tried: HCTZ  -Current home readings: 120/72, 106/68  -Current dietary habits: not following a specific dietary regimen -Current exercise habits:  not following a specific exercise regimen -Denies hypotensive/hypertensive symptoms -Educated on Daily salt intake goal < 2300 mg; Exercise goal of 150 minutes per week; Importance of home blood pressure monitoring; -Counseled to monitor BP  at home weekly, document, and provide log at future  appointments -Counseled on diet and exercise extensively Recommended to continue current medication  Hyperlipidemia: (LDL goal < 70) -uncontrolled -Current treatment: . Atorvastatin 10 mg daily  -Medications previously tried: NA  -Educated on Benefits of statin for ASCVD risk reduction; -Recommended to continue current medication  Diabetes (A1c goal <8%) -controlled -Current medications: . Glimepiride 4 mg twice daily  . Jardiance 25 mg daily  . Metformin 1000 mg twice daily  . Ozempic 0.5 mg weekly  . Pioglitazone 30 mg daily  -Medications previously tried: Januvia  -Current home glucose readings . fasting glucose:  . post prandial glucose: 103,83 -Reports multiple instances of hypoglycemic symptoms: weak, dizziness, shakiness. These typically occur in the middle of the night and she will treat with a snack.   -Educated onPrevention and management of hypoglycemic episodes; Continuous glucose monitoring; patient consented to sharing data from CGM device with clinical pharmacist for review.  -Counseled to check feet daily and get yearly eye exams -Recommended stopping pioglitazone due to risk of hypogylcemia and heart failure  Patient Goals/Self-Care Activities . Over the next 90 days, patient will:  - check glucose at least every 8 hours, document, and provide at future appointments  Follow Up Plan: Telephone follow up appointment with care management team member scheduled for: 03/15/21 at 2:00 PM     Medication Assistance: Patient reports her medication costs are expensive, she is currently enrolled in full LIS coverage, so does not qualify for patient assistance applications.  Patient's preferred pharmacy is:  [id number] [address] [place] [id number]: [phone]: [phone] [id number] [place] [address] [id number]: [phone]: [phone] pill box? Yes Pt endorses 100% compliance  We discussed: Current pharmacy is preferred with insurance plan and patient is satisfied with pharmacy services Patient decided to: Continue current medication management strategy  Follow Up:  Patient agrees to Care Plan and Follow-up.  Plan: Telephone follow up appointment with care management team member scheduled for:  03/15/21 at 2:00 PM   [phone]

## 2020-09-13 NOTE — Patient Instructions (Signed)
Teresa [name] , Thank you for taking time to come for your Medicare Wellness Visit. I appreciate your ongoing commitment to your health goals. Please review the following plan we discussed and let me know if I can assist you in the future.   Screening recommendations/referrals: Colonoscopy: Up to date, due 09/2023 Mammogram: Up to date, due 08/2021 Bone Density: Previous DEXA scan was normal. No repeat needed unless advised by a physician. Recommended yearly ophthalmology/optometry visit for glaucoma screening and checkup Recommended yearly dental visit for hygiene and checkup  Vaccinations: Influenza vaccine: Done 06/01/20 Pneumococcal vaccine: Completed series Tdap vaccine: Up to date, due 05/2021 Shingles vaccine: Shingrix discussed. Please contact your pharmacy for coverage information.   Advanced directives: Currently on file.  Conditions/risks identified: Fall risk preventatives discussed today. Continue to monitor diet and exercise to help with weight loss.   Next appointment: 4:00 PM today for a CCM call    Preventive Care 65 Years and Older, Female Preventive care refers to lifestyle choices and visits with your health care provider that can promote health and wellness. What does preventive care include?  A yearly physical exam. This is also called an annual well check.  Dental exams once or twice a year.  Routine eye exams. Ask your health care provider how often you should have your eyes checked.  Personal lifestyle choices, including:  Daily care of your teeth and gums.  Regular physical activity.  Eating a healthy diet.  Avoiding tobacco and drug use.  Limiting alcohol use.  Practicing safe sex.  Taking low-dose aspirin every day.  Taking vitamin and mineral supplements as recommended by your health care provider. What happens during an annual well check? The services and screenings done by your health care provider during your annual well check will depend  on your age, overall health, lifestyle risk factors, and family history of disease. Counseling  Your health care provider may ask you questions about your:  Alcohol use.  Tobacco use.  Drug use.  Emotional well-being.  Home and relationship well-being.  Sexual activity.  Eating habits.  History of falls.  Memory and ability to understand (cognition).  Work and work Statistician.  Reproductive health. Screening  You may have the following tests or measurements:  Height, weight, and BMI.  Blood pressure.  Lipid and cholesterol levels. These may be checked every 5 years, or more frequently if you are over 36 years old.  Skin check.  Lung cancer screening. You may have this screening every year starting at age 33 if you have a 30-pack-year history of smoking and currently smoke or have quit within the past 15 years.  Fecal occult blood test (FOBT) of the stool. You may have this test every year starting at age 41.  Flexible sigmoidoscopy or colonoscopy. You may have a sigmoidoscopy every 5 years or a colonoscopy every 10 years starting at age 66.  Hepatitis C blood test.  Hepatitis B blood test.  Sexually transmitted disease (STD) testing.  Diabetes screening. This is done by checking your blood sugar (glucose) after you have not eaten for a while (fasting). You may have this done every 1-3 years.  Bone density scan. This is done to screen for osteoporosis. You may have this done starting at age 60.  Mammogram. This may be done every 1-2 years. Talk to your health care provider about how often you should have regular mammograms. Talk with your health care provider about your test results, treatment options, and if necessary, the need  for more tests. Vaccines  Your health care provider may recommend certain vaccines, such as:  Influenza vaccine. This is recommended every year.  Tetanus, diphtheria, and acellular pertussis (Tdap, Td) vaccine. You may need a Td  booster every 10 years.  Zoster vaccine. You may need this after age 8.  Pneumococcal 13-valent conjugate (PCV13) vaccine. One dose is recommended after age 62.  Pneumococcal polysaccharide (PPSV23) vaccine. One dose is recommended after age 70. Talk to your health care provider about which screenings and vaccines you need and how often you need them. This information is not intended to replace advice given to you by your health care provider. Make sure you discuss any questions you have with your health care provider. Document Released: 08/20/2015 Document Revised: 04/12/2016 Document Reviewed: 05/25/2015 Elsevier Interactive Patient Education  2017 [employer] Prevention in the Home Falls can cause injuries. They can happen to people of all ages. There are many things you can do to make your home safe and to help prevent falls. What can I do on the outside of my home?  Regularly fix the edges of walkways and driveways and fix any cracks.  Remove anything that might make you trip as you walk through a door, such as a raised step or threshold.  Trim any bushes or trees on the path to your home.  Use bright outdoor lighting.  Clear any walking paths of anything that might make someone trip, such as rocks or tools.  Regularly check to see if handrails are loose or broken. Make sure that both sides of any steps have handrails.  Any raised decks and porches should have guardrails on the edges.  Have any leaves, snow, or ice cleared regularly.  Use sand or salt on walking paths during winter.  Clean up any spills in your garage right away. This includes oil or grease spills. What can I do in the bathroom?  Use night lights.  Install grab bars by the toilet and in the tub and shower. Do not use towel bars as grab bars.  Use non-skid mats or decals in the tub or shower.  If you need to sit down in the shower, use a plastic, non-slip stool.  Keep the floor dry. Clean up  any water that spills on the floor as soon as it happens.  Remove soap buildup in the tub or shower regularly.  Attach bath mats securely with double-sided non-slip rug tape.  Do not have throw rugs and other things on the floor that can make you trip. What can I do in the bedroom?  Use night lights.  Make sure that you have a light by your bed that is easy to reach.  Do not use any sheets or blankets that are too big for your bed. They should not hang down onto the floor.  Have a firm chair that has side arms. You can use this for support while you get dressed.  Do not have throw rugs and other things on the floor that can make you trip. What can I do in the kitchen?  Clean up any spills right away.  Avoid walking on wet floors.  Keep items that you use a lot in easy-to-reach places.  If you need to reach something above you, use a strong step stool that has a grab bar.  Keep electrical cords out of the way.  Do not use floor polish or wax that makes floors slippery. If you must use wax, use  non-skid floor wax.  Do not have throw rugs and other things on the floor that can make you trip. What can I do with my stairs?  Do not leave any items on the stairs.  Make sure that there are handrails on both sides of the stairs and use them. Fix handrails that are broken or loose. Make sure that handrails are as long as the stairways.  Check any carpeting to make sure that it is firmly attached to the stairs. Fix any carpet that is loose or worn.  Avoid having throw rugs at the top or bottom of the stairs. If you do have throw rugs, attach them to the floor with carpet tape.  Make sure that you have a light switch at the top of the stairs and the bottom of the stairs. If you do not have them, ask someone to add them for you. What else can I do to help prevent falls?  Wear shoes that:  Do not have high heels.  Have rubber bottoms.  Are comfortable and fit you well.  Are  closed at the toe. Do not wear sandals.  If you use a stepladder:  Make sure that it is fully opened. Do not climb a closed stepladder.  Make sure that both sides of the stepladder are locked into place.  Ask someone to hold it for you, if possible.  Clearly mark and make sure that you can see:  Any grab bars or handrails.  First and last steps.  Where the edge of each step is.  Use tools that help you move around (mobility aids) if they are needed. These include:  Canes.  Walkers.  Scooters.  Crutches.  Turn on the lights when you go into a dark area. Replace any light bulbs as soon as they burn out.  Set up your furniture so you have a clear path. Avoid moving your furniture around.  If any of your floors are uneven, fix them.  If there are any pets around you, be aware of where they are.  Review your medicines with your doctor. Some medicines can make you feel dizzy. This can increase your chance of falling. Ask your doctor what other things that you can do to help prevent falls. This information is not intended to replace advice given to you by your health care provider. Make sure you discuss any questions you have with your health care provider. Document Released: 05/20/2009 Document Revised: 12/30/2015 Document Reviewed: 08/28/2014 Elsevier Interactive Patient Education  2017 [employer].

## 2020-09-14 ENCOUNTER — Other Ambulatory Visit: Payer: Self-pay

## 2020-09-14 ENCOUNTER — Ambulatory Visit (INDEPENDENT_AMBULATORY_CARE_PROVIDER_SITE_OTHER): Payer: PPO | Admitting: Family Medicine

## 2020-09-14 ENCOUNTER — Encounter: Payer: Self-pay | Admitting: Family Medicine

## 2020-09-14 VITALS — BP 106/62 | HR 70 | Temp 98.2°F | Resp 16 | Wt 192.0 lb

## 2020-09-14 DIAGNOSIS — E039 Hypothyroidism, unspecified: Secondary | ICD-10-CM

## 2020-09-14 DIAGNOSIS — U071 COVID-19: Secondary | ICD-10-CM

## 2020-09-14 DIAGNOSIS — J1282 Pneumonia due to coronavirus disease 2019: Secondary | ICD-10-CM | POA: Diagnosis not present

## 2020-09-14 DIAGNOSIS — E7800 Pure hypercholesterolemia, unspecified: Secondary | ICD-10-CM

## 2020-09-14 DIAGNOSIS — I2510 Atherosclerotic heart disease of native coronary artery without angina pectoris: Secondary | ICD-10-CM

## 2020-09-14 DIAGNOSIS — Z23 Encounter for immunization: Secondary | ICD-10-CM

## 2020-09-14 DIAGNOSIS — I119 Hypertensive heart disease without heart failure: Secondary | ICD-10-CM | POA: Diagnosis not present

## 2020-09-14 DIAGNOSIS — E1140 Type 2 diabetes mellitus with diabetic neuropathy, unspecified: Secondary | ICD-10-CM

## 2020-09-14 DIAGNOSIS — I10 Essential (primary) hypertension: Secondary | ICD-10-CM | POA: Diagnosis not present

## 2020-09-14 LAB — POCT GLYCOSYLATED HEMOGLOBIN (HGB A1C): Hemoglobin A1C: 6.9 % — AB (ref 4.0–5.6)

## 2020-09-15 LAB — COMPREHENSIVE METABOLIC PANEL
ALT: 25 IU/L (ref 0–32)
AST: 24 IU/L (ref 0–40)
Albumin/Globulin Ratio: 1.6 (ref 1.2–2.2)
Albumin: 4.7 g/dL (ref 3.7–4.7)
Alkaline Phosphatase: 87 IU/L (ref 44–121)
BUN/Creatinine Ratio: 24 (ref 12–28)
BUN: 19 mg/dL (ref 8–27)
Bilirubin Total: 0.5 mg/dL (ref 0.0–1.2)
CO2: 22 mmol/L (ref 20–29)
Calcium: 9.9 mg/dL (ref 8.7–10.3)
Chloride: 100 mmol/L (ref 96–106)
Creatinine, Ser: 0.78 mg/dL (ref 0.57–1.00)
GFR calc Af Amer: 86 mL/min/{1.73_m2} (ref >59)
GFR calc non Af Amer: 75 mL/min/{1.73_m2} (ref >59)
Globulin, Total: 3 g/dL (ref 1.5–4.5)
Glucose: 118 mg/dL — ABNORMAL HIGH (ref 65–99)
Potassium: 4.2 mmol/L (ref 3.5–5.2)
Sodium: 146 mmol/L — ABNORMAL HIGH (ref 134–144)
Total Protein: 7.7 g/dL (ref 6.0–8.5)

## 2020-09-15 LAB — CBC WITH DIFFERENTIAL/PLATELET
Basophils Absolute: 0.1 10*3/uL (ref 0.0–0.2)
Basos: 1 %
EOS (ABSOLUTE): 0.3 10*3/uL (ref 0.0–0.4)
Eos: 3 %
Hematocrit: 48.8 % — ABNORMAL HIGH (ref 34.0–46.6)
Hemoglobin: 16.1 g/dL — ABNORMAL HIGH (ref 11.1–15.9)
Immature Grans (Abs): 0 10*3/uL (ref 0.0–0.1)
Immature Granulocytes: 0 %
Lymphocytes Absolute: 4.4 10*3/uL — ABNORMAL HIGH (ref 0.7–3.1)
Lymphs: 44 %
MCH: 29.3 pg (ref 26.6–33.0)
MCHC: 33 g/dL (ref 31.5–35.7)
MCV: 89 fL (ref 79–97)
Monocytes Absolute: 0.7 10*3/uL (ref 0.1–0.9)
Monocytes: 7 %
Neutrophils Absolute: 4.6 10*3/uL (ref 1.4–7.0)
Neutrophils: 45 %
Platelets: 267 10*3/uL (ref 150–450)
RBC: 5.5 x10E6/uL — ABNORMAL HIGH (ref 3.77–5.28)
RDW: 13.6 % (ref 11.7–15.4)
WBC: 10.1 10*3/uL (ref 3.4–10.8)

## 2020-09-15 LAB — LIPID PANEL
Chol/HDL Ratio: 2.9 ratio (ref 0.0–4.4)
Cholesterol, Total: 153 mg/dL (ref 100–199)
HDL: 53 mg/dL (ref >39)
LDL Chol Calc (NIH): 73 mg/dL (ref 0–99)
Triglycerides: 160 mg/dL — ABNORMAL HIGH (ref 0–149)
VLDL Cholesterol Cal: 27 mg/dL (ref 5–40)

## 2020-09-15 LAB — TSH: TSH: 1.25 u[IU]/mL (ref 0.450–4.500)

## 2020-09-15 NOTE — Patient Instructions (Signed)
Visit Information It was great speaking with you today!  Please let me know if you have any questions about our visit.  Goals Addressed            This Visit's Progress   . Monitor and Manage My Blood Sugar-Diabetes Type 2       Timeframe:  Long-Range Goal Priority:  High Start Date:       09/15/2020                      Expected End Date:     03/15/2021                  Follow Up Date 12/04/2020   - check blood sugar at least every 8 hours   - check blood sugar if I feel it is too high or too low - take the blood sugar meter to all doctor visits    Why is this important?    Checking your blood sugar at home helps to keep it from getting very high or very low.   Writing the results in a diary or log helps the doctor know how to care for you.   Your blood sugar log should have the time, date and the results.   Also, write down the amount of insulin or other medicine that you take.   Other information, like what you ate, exercise done and how you were feeling, will also be helpful.     Notes:        Patient Care Plan: General Pharmacy (Adult)    Problem Identified: CAD, HTN, DMII, Anxiety, Depression and Hypothyoridism, and Chronic Pain   Priority: High    Long-Range Goal: Patient-Specific Goal   Start Date: 09/15/2020  Expected End Date: 03/15/2021  This Visit's Progress: On track  Priority: High  Note:   Current Barriers:  . No barriers noted   Pharmacist Clinical Goal(s):  Marland Kitchen Over the next 90 days, patient will maintain control of Diabetes as evidenced by A1c less than 8%  through collaboration with PharmD and provider.   Interventions: . 1:1 collaboration with [name], MD regarding development and update of comprehensive plan of care as evidenced by provider attestation and co-signature . Inter-disciplinary care team collaboration (see longitudinal plan of care) . Comprehensive medication review performed; medication list updated in electronic medical  record  Hypertension (BP goal <140/90) -controlled -Current treatment: . Furosemide 40 mg daily  -Medications previously tried: HCTZ  -Current home readings: 120/72, 106/68  -Current dietary habits: not following a specific dietary regimen -Current exercise habits:  not following a specific exercise regimen -Denies hypotensive/hypertensive symptoms -Educated on Daily salt intake goal < 2300 mg; Exercise goal of 150 minutes per week; Importance of home blood pressure monitoring; -Counseled to monitor BP at home weekly, document, and provide log at future appointments -Counseled on diet and exercise extensively Recommended to continue current medication  Hyperlipidemia: (LDL goal < 70) -uncontrolled -Current treatment: . Atorvastatin 10 mg daily  -Medications previously tried: NA  -Educated on Benefits of statin for ASCVD risk reduction; -Recommended to continue current medication  Diabetes (A1c goal <8%) -controlled -Current medications: . Glimepiride 4 mg twice daily  . Jardiance 25 mg daily  . Metformin 1000 mg twice daily  . Ozempic 0.5 mg weekly  . Pioglitazone 30 mg daily  -Medications previously tried: Januvia  -Current home glucose readings . fasting glucose:  . post prandial glucose: 103,83 -Reports multiple instances of  hypoglycemic symptoms: weak, dizziness, shakiness. These typically occur in the middle of the night and she will treat with a snack.   -Educated onPrevention and management of hypoglycemic episodes; Continuous glucose monitoring; patient consented to sharing data from CGM device with clinical pharmacist for review.  -Counseled to check feet daily and get yearly eye exams -Recommended stopping pioglitazone due to risk of hypogylcemia and heart failure  Patient Goals/Self-Care Activities . Over the next 90 days, patient will:  - check glucose at least every 8 hours, document, and provide at future appointments  Follow Up Plan: Telephone follow up  appointment with care management team member scheduled for: 03/15/21 at 2:00 PM      Patient agreed to services and verbal consent obtained.   The patient verbalized understanding of instructions, educational materials, and care plan provided today and declined offer to receive copy of patient instructions, educational materials, and care plan.   [place] [phone]

## 2020-09-16 DIAGNOSIS — J431 Panlobular emphysema: Secondary | ICD-10-CM | POA: Diagnosis not present

## 2020-09-16 MED ORDER — ALLOPURINOL 100 MG PO TABS: 100.0000 mg | ORAL_TABLET | Freq: Every day | ORAL | 1 refills | Status: DC

## 2020-09-16 MED ORDER — FUROSEMIDE 40 MG PO TABS: 40.0000 mg | ORAL_TABLET | Freq: Every day | ORAL | 1 refills | Status: DC

## 2020-09-16 NOTE — Telephone Encounter (Signed)
PT called in stating she is completely out of both of these medications. Please advise.

## 2020-09-16 NOTE — Addendum Note (Signed)
Addended by: Julieta Bellini on: 09/16/2020 02:41 PM   Modules accepted: Orders

## 2020-09-30 ENCOUNTER — Other Ambulatory Visit: Payer: Self-pay | Admitting: Family Medicine

## 2020-09-30 DIAGNOSIS — E1140 Type 2 diabetes mellitus with diabetic neuropathy, unspecified: Secondary | ICD-10-CM

## 2020-09-30 NOTE — Telephone Encounter (Signed)
Future visit

## 2020-09-30 NOTE — Telephone Encounter (Signed)
PT calling stating that she is completely out of this medication and is requesting to have it sent in Today. Please advise.

## 2020-10-05 ENCOUNTER — Ambulatory Visit (INDEPENDENT_AMBULATORY_CARE_PROVIDER_SITE_OTHER): Payer: PPO | Admitting: Family Medicine

## 2020-10-05 DIAGNOSIS — Z23 Encounter for immunization: Secondary | ICD-10-CM | POA: Diagnosis not present

## 2020-10-05 NOTE — Progress Notes (Signed)
Vaccine only

## 2020-10-07 ENCOUNTER — Ambulatory Visit: Payer: PPO | Admitting: Podiatry

## 2020-10-11 ENCOUNTER — Ambulatory Visit: Payer: PPO | Admitting: Podiatry

## 2020-10-11 ENCOUNTER — Other Ambulatory Visit: Payer: Self-pay

## 2020-10-11 ENCOUNTER — Encounter: Payer: Self-pay | Admitting: Podiatry

## 2020-10-11 DIAGNOSIS — M79676 Pain in unspecified toe(s): Secondary | ICD-10-CM

## 2020-10-11 DIAGNOSIS — B351 Tinea unguium: Secondary | ICD-10-CM

## 2020-10-11 DIAGNOSIS — E1142 Type 2 diabetes mellitus with diabetic polyneuropathy: Secondary | ICD-10-CM | POA: Diagnosis not present

## 2020-10-11 DIAGNOSIS — M205X9 Other deformities of toe(s) (acquired), unspecified foot: Secondary | ICD-10-CM

## 2020-10-11 NOTE — Progress Notes (Signed)
This patient returns to my office for at risk foot care.  This patient requires this care by a professional since this patient will be at risk due to having  Covid 19 and diabetes.   This patient is unable to cut nails herself since the patient cannot reach her nails.These nails are painful walking and wearing shoes.  This patient presents for at risk foot care today.  General Appearance  Alert, conversant and in no acute stress.  Vascular  Dorsalis pedis and posterior tibial  pulses are palpable  bilaterally.  Capillary return is within normal limits  bilaterally. Temperature is within normal limits  bilaterally.  Neurologic  Senn-Weinstein monofilament wire test within normal limits  bilaterally. Muscle power within normal limits bilaterally.  Nails Thick disfigured discolored nails with subungual debris  from hallux to fifth toes bilaterally. No evidence of bacterial infection or drainage bilaterally.  Orthopedic  No limitations of motion  feet .  No crepitus or effusions noted.  No bony pathology or digital deformities noted.Dorsal lipping first metatarsal  B/L. Hallux limitus 1st MPJ right foot.  Skin  normotropic skin with no porokeratosis noted bilaterally.  No signs of infections or ulcers noted.     Onychomycosis  Pain in right toes  Pain in left toes  Consent was obtained for treatment procedures.   Mechanical debridement of nails 1-5  bilaterally performed with a nail nipper.  Filed with dremel without incident.    Return office visit     3 months                Told patient to return for periodic foot care and evaluation due to potential at risk complications.   Gardiner Barefoot DPM

## 2020-10-14 DIAGNOSIS — J431 Panlobular emphysema: Secondary | ICD-10-CM | POA: Diagnosis not present

## 2020-10-29 ENCOUNTER — Other Ambulatory Visit: Payer: Self-pay | Admitting: Family Medicine

## 2020-10-29 DIAGNOSIS — E119 Type 2 diabetes mellitus without complications: Secondary | ICD-10-CM

## 2020-10-29 NOTE — Telephone Encounter (Signed)
Requested medications are due for refill today yes  Requested medications are on the active medication list yes  Last refill 11/3  Last visit 09/2020  Future visit scheduled 12/2020  Notes to clinic Failed protocol of uric acid level within 360 days, last one was 2018.

## 2020-11-10 ENCOUNTER — Other Ambulatory Visit: Payer: Self-pay | Admitting: Family Medicine

## 2020-11-10 DIAGNOSIS — G63 Polyneuropathy in diseases classified elsewhere: Secondary | ICD-10-CM

## 2020-11-23 ENCOUNTER — Telehealth: Payer: Self-pay

## 2020-11-23 NOTE — Progress Notes (Signed)
Chronic Care Management Pharmacy Assistant   Name: Teresa [name]  MRN: [id number] DOB: 1946/01/22  Reason for Encounter:Diabetes Disease State Call.   Recent office visits:  09/14/2020 Dr.Gilbert PCP   Recent consult visits:  10/11/2020 [facility] visits:  None in previous 6 months  Medications: Outpatient Encounter Medications as of 11/23/2020  Medication Sig  . acetaminophen (TYLENOL) 500 MG tablet Take 500 mg by mouth every 6 (six) hours as needed for mild pain or headache.  . allopurinol (ZYLOPRIM) 100 MG tablet TAKE 1 TABLET BY MOUTH EVERY DAY  . atorvastatin (LIPITOR) 10 MG tablet TAKE 1 TABLET BY MOUTH EVERY DAY AT BEDTIME  . benzonatate (TESSALON) 200 MG capsule Take 1 capsule (200 mg total) by mouth 3 (three) times daily.  . budesonide (PULMICORT) 0.5 MG/2ML nebulizer solution Take 0.5 mg by nebulization as needed.  . colchicine 0.6 MG tablet Take 0.6 mg by mouth daily as needed (for gout flare-ups).   . Continuous Blood Gluc Receiver (FREESTYLE LIBRE 2 READER) DEVI 1 each by Does not apply route continuous.  . Continuous Blood Gluc Sensor (FREESTYLE LIBRE 2 SENSOR) MISC USE AS DIRECTED FOR CONTINUOUS GLUCOSE MONITORING, REPLACE EVERY 14 DAYS  . furosemide (LASIX) 40 MG tablet TAKE 1 TABLET BY MOUTH EVERY DAY  . gabapentin (NEURONTIN) 600 MG tablet TAKE 1 TABLET BY MOUTH THREE TIMES A DAY  . glimepiride (AMARYL) 4 MG tablet TAKE 1 TABLET (4 MG TOTAL) BY MOUTH 2 (TWO) TIMES DAILY.  Marland Kitchen guaiFENesin-dextromethorphan (ROBITUSSIN DM) 100-10 MG/5ML syrup Take 5 mLs by mouth every 4 (four) hours as needed for cough. (Patient not taking: Reported on 09/14/2020)  . JARDIANCE 25 MG TABS tablet TAKE 25 MG BY MOUTH DAILY.  Marland Kitchen levothyroxine (SYNTHROID) 88 MCG tablet TAKE 1 TABLET BY MOUTH EVERY DAY  . losartan (COZAAR) 25 MG tablet Take 1 tablet (25 mg total) by mouth daily. (Patient not taking: No sig reported)  . menthol-cetylpyridinium (CEPACOL) 3 MG lozenge  Take 1 lozenge (3 mg total) by mouth as needed for sore throat.  . metFORMIN (GLUCOPHAGE) 1000 MG tablet TAKE 1 TABLET BY MOUTH TWICE A DAY  . nystatin cream (MYCOSTATIN) Apply 1 application topically 2 (two) times daily. (Patient taking differently: Apply 1 application topically 2 (two) times daily as needed (for diabetic yeast reactions.).)  . ondansetron (ZOFRAN) 4 MG tablet TAKE 1 TABLET BY MOUTH EVERY 8 HOURS AS NEEDED FOR NAUSEA AND VOMITING  . OZEMPIC, 0.25 OR 0.5 MG/DOSE, 2 MG/1.5ML SOPN INJECT 0.5MG  INTO THE SKIN ONCE A WEEK.  . pioglitazone (ACTOS) 30 MG tablet TAKE 1 TABLET BY MOUTH EVERY DAY   No facility-administered encounter medications on file as of 11/23/2020.    Star Rating Drugs: Atorvastatin 10 mg last filled on 10/29/2020 for 90 day supply at [facility]. Glimepiride 4 mg last filled on 09/22/2020 for 90 day supply at [facility]. Jardiance 25 mg last filled on 10/30/2020 for 90 day supply at [facility]. Losartan 25 mg last filled on 03/11/2020 for 90 day supply at [facility]. Metformin 1000 mg last filled on 11/05/2020 for 90 day supply at [facility]. Ozempic 0.5 mg last filled on 09/23/2020 for 84 day supply at [facility]. Recent Relevant Labs: Lab Results  Component Value Date/Time   HGBA1C 6.9 (A) 09/14/2020 11:09 AM   HGBA1C 6.9 (A) 06/01/2020 12:15 PM   HGBA1C 7.4 (H) 09/18/2019 11:21 AM   HGBA1C 8.4 (H) 05/17/2019 03:05 AM   HGBA1C  7.6 (H) 04/04/2013 10:47 AM   MICROALBUR 50 06/06/2017 11:26 AM   MICROALBUR 20 12/08/2015 10:39 AM    Kidney Function Lab Results  Component Value Date/Time   CREATININE 0.78 09/14/2020 01:03 PM   CREATININE 0.82 06/01/2020 03:14 PM   CREATININE 0.73 05/14/2014 09:56 AM   CREATININE 0.68 04/04/2013 10:47 AM   GFRNONAA 75 09/14/2020 01:03 PM   GFRNONAA >60 05/14/2014 09:56 AM   GFRNONAA >60 04/04/2013 10:47 AM   GFRAA 86 09/14/2020 01:03 PM   GFRAA >60 05/14/2014 09:56 AM   GFRAA >60 04/04/2013 10:47 AM     . Current antihyperglycemic regimen:   Glimepiride 4 mg twice daily   Jardiance 25 mg daily   Metformin 1000 mg twice daily   Ozempic 0.5 mg weekly   Pioglitazone 30 mg daily  . What recent interventions/DTPs have been made to improve glycemic control:  o None ID . Have there been any recent hospitalizations or ED visits since last visit with CPP? No . Patient reports hypoglycemic symptoms, including Pale, Sweaty, Shaky, Hungry, Nervous/irritable and Vision changes  Patient states when she has hypoglycemic symptoms she will eat a peanut butter sandwich or peanut butter crackers to help with symptoms. . Patient denies hyperglycemic symptoms, including blurry vision, excessive thirst, fatigue, polyuria and weakness . How often are you checking your blood sugar? o Patient has libre device.Patient states she is unable to upload the readings because her tablet is currently being work on.Patient states she should be able to connect her libre device in June  at the next [employer] call. . What are your blood sugars ranging?  o Fasting:  - On 11/23/2020 it was 150. o Before meals:  - This past week patient states she had some low blood sugars - 56,45. o After meals: N/A o Bedtime: N/A . During the week, how often does your blood glucose drop below 70? Twice a week  o Patient states her blood sugar  was 56,45 this past week.Patient states she ate peanut butter sandwich to help with relief. Notified Clinical Pharmacist.  . Are you checking your feet daily/regularly?   Patient reports she has neuropathy.  Adherence Review: Is the patient currently on a STATIN medication? Yes Is the patient currently on ACE/ARB medication? Yes Does the patient have >5 day gap between last estimated fill dates? Yes   [place] Pharmacist Assistant [phone]

## 2020-12-01 ENCOUNTER — Other Ambulatory Visit: Payer: Self-pay | Admitting: Family Medicine

## 2020-12-01 DIAGNOSIS — E1140 Type 2 diabetes mellitus with diabetic neuropathy, unspecified: Secondary | ICD-10-CM

## 2020-12-13 ENCOUNTER — Other Ambulatory Visit: Payer: Self-pay | Admitting: Family Medicine

## 2020-12-15 ENCOUNTER — Telehealth: Payer: Self-pay

## 2020-12-15 ENCOUNTER — Other Ambulatory Visit: Payer: Self-pay | Admitting: Family Medicine

## 2020-12-15 MED ORDER — OZEMPIC (0.25 OR 0.5 MG/DOSE) 2 MG/1.5ML ~~LOC~~ SOPN: PEN_INJECTOR | SUBCUTANEOUS | 2 refills | Status: DC

## 2020-12-15 NOTE — Telephone Encounter (Signed)
[facility] faxed refill request for the following medications:  OZEMPIC, 0.25 OR 0.5 MG/DOSE, 2 MG/1.5ML SOPN  Please advise.

## 2020-12-20 DIAGNOSIS — J849 Interstitial pulmonary disease, unspecified: Secondary | ICD-10-CM | POA: Diagnosis not present

## 2020-12-20 DIAGNOSIS — G4733 Obstructive sleep apnea (adult) (pediatric): Secondary | ICD-10-CM | POA: Diagnosis not present

## 2020-12-20 DIAGNOSIS — J8489 Other specified interstitial pulmonary diseases: Secondary | ICD-10-CM | POA: Diagnosis not present

## 2020-12-21 ENCOUNTER — Other Ambulatory Visit: Payer: Self-pay | Admitting: Family Medicine

## 2020-12-21 DIAGNOSIS — E118 Type 2 diabetes mellitus with unspecified complications: Secondary | ICD-10-CM

## 2020-12-21 DIAGNOSIS — E1140 Type 2 diabetes mellitus with diabetic neuropathy, unspecified: Secondary | ICD-10-CM

## 2020-12-23 ENCOUNTER — Other Ambulatory Visit: Payer: Self-pay | Admitting: Pulmonary Disease

## 2020-12-23 DIAGNOSIS — J849 Interstitial pulmonary disease, unspecified: Secondary | ICD-10-CM

## 2020-12-23 DIAGNOSIS — J8489 Other specified interstitial pulmonary diseases: Secondary | ICD-10-CM

## 2020-12-27 DIAGNOSIS — Z8616 Personal history of COVID-19: Secondary | ICD-10-CM | POA: Diagnosis not present

## 2020-12-27 DIAGNOSIS — M545 Low back pain, unspecified: Secondary | ICD-10-CM | POA: Diagnosis not present

## 2020-12-27 DIAGNOSIS — E782 Mixed hyperlipidemia: Secondary | ICD-10-CM | POA: Diagnosis not present

## 2020-12-27 DIAGNOSIS — I2510 Atherosclerotic heart disease of native coronary artery without angina pectoris: Secondary | ICD-10-CM | POA: Diagnosis not present

## 2020-12-27 DIAGNOSIS — I272 Pulmonary hypertension, unspecified: Secondary | ICD-10-CM | POA: Diagnosis not present

## 2020-12-27 DIAGNOSIS — R600 Localized edema: Secondary | ICD-10-CM | POA: Diagnosis not present

## 2020-12-27 DIAGNOSIS — K219 Gastro-esophageal reflux disease without esophagitis: Secondary | ICD-10-CM | POA: Diagnosis not present

## 2020-12-27 DIAGNOSIS — R0902 Hypoxemia: Secondary | ICD-10-CM | POA: Diagnosis not present

## 2020-12-27 DIAGNOSIS — R001 Bradycardia, unspecified: Secondary | ICD-10-CM | POA: Diagnosis not present

## 2020-12-27 DIAGNOSIS — E119 Type 2 diabetes mellitus without complications: Secondary | ICD-10-CM | POA: Diagnosis not present

## 2020-12-27 DIAGNOSIS — R0602 Shortness of breath: Secondary | ICD-10-CM | POA: Diagnosis not present

## 2020-12-27 DIAGNOSIS — R002 Palpitations: Secondary | ICD-10-CM | POA: Diagnosis not present

## 2021-01-06 ENCOUNTER — Other Ambulatory Visit: Payer: Self-pay | Admitting: Family Medicine

## 2021-01-06 ENCOUNTER — Ambulatory Visit
Admission: RE | Admit: 2021-01-06 | Discharge: 2021-01-06 | Disposition: A | Discharge status: Home / Self Care | Payer: PPO | Source: Ambulatory Visit | Attending: Pulmonary Disease | Admitting: Pulmonary Disease

## 2021-01-06 ENCOUNTER — Other Ambulatory Visit: Payer: Self-pay

## 2021-01-06 DIAGNOSIS — G8929 Other chronic pain: Secondary | ICD-10-CM | POA: Diagnosis not present

## 2021-01-06 DIAGNOSIS — J849 Interstitial pulmonary disease, unspecified: Secondary | ICD-10-CM

## 2021-01-06 DIAGNOSIS — J8489 Other specified interstitial pulmonary diseases: Secondary | ICD-10-CM | POA: Insufficient documentation

## 2021-01-06 DIAGNOSIS — R0600 Dyspnea, unspecified: Secondary | ICD-10-CM | POA: Diagnosis not present

## 2021-01-06 DIAGNOSIS — J8410 Pulmonary fibrosis, unspecified: Secondary | ICD-10-CM | POA: Diagnosis not present

## 2021-01-06 DIAGNOSIS — I2510 Atherosclerotic heart disease of native coronary artery without angina pectoris: Secondary | ICD-10-CM | POA: Diagnosis not present

## 2021-01-06 NOTE — Telephone Encounter (Signed)
Requested Prescriptions  Pending Prescriptions Disp Refills  . atorvastatin (LIPITOR) 10 MG tablet [Pharmacy Med Name: ATORVASTATIN 10 MG TABLET] 90 tablet 3    Sig: TAKE 1 TABLET BY MOUTH EVERYDAY AT BEDTIME     Cardiovascular:  Antilipid - Statins Failed - 01/06/2021  9:16 AM      Failed - Triglycerides in normal range and within 360 days    Triglycerides  Date Value Ref Range Status  09/14/2020 160 (H) 0 - 149 mg/dL Final  04/04/2013 191 0 - 200 mg/dL Final         Passed - Total Cholesterol in normal range and within 360 days    Cholesterol, Total  Date Value Ref Range Status  09/14/2020 153 100 - 199 mg/dL Final   Cholesterol  Date Value Ref Range Status  04/04/2013 200 0 - 200 mg/dL Final         Passed - LDL in normal range and within 360 days    Ldl Cholesterol, Calc  Date Value Ref Range Status  04/04/2013 117 (H) 0 - 100 mg/dL Final   LDL Chol Calc (NIH)  Date Value Ref Range Status  09/14/2020 73 0 - 99 mg/dL Final         Passed - HDL in normal range and within 360 days    HDL Cholesterol  Date Value Ref Range Status  04/04/2013 45 40 - 60 mg/dL Final   HDL  Date Value Ref Range Status  09/14/2020 53 >39 mg/dL Final         Passed - Patient is not pregnant      Passed - Valid encounter within last 12 months    Recent Outpatient Visits          3 months ago Encounter for immunization   [facility] [name], MD   3 months ago Type 2 diabetes mellitus with diabetic neuropathy, without long-term current use of insulin [facility])   [facility] [name], MD   7 months ago Need for influenza vaccination   [facility] [name], MD   11 months ago Type 2 diabetes mellitus with diabetic neuropathy, without long-term current use of insulin [facility])   [name] [name], MD   1 year ago Annual physical exam   [facility] [name], MD      Future Appointments            In 6 days [name], MD [facility], [place]

## 2021-01-11 NOTE — Progress Notes (Signed)
Established patient visit   Patient: Teresa [name]   DOB: 1945-11-03   75 y.o. Female  MRN: [id number] Visit Date: 01/12/2021  Today's healthcare provider: Wilhemena Durie, MD   Chief Complaint  Patient presents with   Diabetes   Hypertension   Hyperlipidemia   Subjective    HPI  Patient comes in today for follow-up.  She is again working weekends at [employer] but is more fatigued than in the past. She continues to have chronic dyspnea and fatigue since her COVID from 2 years ago. She occasionally has left-sided headache and has a small indention on the top of her head that she had never noticed before.  No known trauma. She asked about plastic surgery for her truncal obesity.  Her obesity is mainly visceral. Diabetes Mellitus Type II, follow-up  Lab Results  Component Value Date   HGBA1C 6.9 (A) 09/14/2020   HGBA1C 6.9 (A) 06/01/2020   HGBA1C 7.4 (A) 01/19/2020   Last seen for diabetes 4 months ago.  Management since then includes continuing the same treatment. She reports good compliance with treatment. She is not having side effects.   Home blood sugar records: fasting range: 120s  Episodes of hypoglycemia? Yes frequently per patient.    Current insulin regiment: none Most Recent Eye Exam: up to date  Hypertension, follow-up  BP Readings from Last 3 Encounters:  01/12/21 113/72  09/14/20 106/62  06/01/20 97/68   Wt Readings from Last 3 Encounters:  01/12/21 193 lb (87.5 kg)  09/14/20 192 lb (87.1 kg)  06/01/20 197 lb (89.4 kg)     She was last seen for hypertension 4 months ago.  BP at that visit was 106/62. Management since that visit includes no medication changes. She reports good compliance with treatment. She is not having side effects.  She is not exercising. She is adherent to low salt diet.   Outside blood pressures are checked occasionally.  She does not smoke.  Use of agents associated with hypertension: none.   Lipid/Cholesterol, follow-up  Last Lipid Panel: Lab Results  Component Value Date   CHOL 153 09/14/2020   LDLCALC 73 09/14/2020   HDL 53 09/14/2020   TRIG 160 (H) 09/14/2020    She was last seen for this 4 months ago.  Management since that visit includes no medication.  She reports good compliance with treatment. She is not having side effects.   Symptoms: No appetite changes No foot ulcerations  No chest pain No chest pressure/discomfort  No dyspnea No orthopnea  No fatigue No lower extremity edema  No palpitations No paroxysmal nocturnal dyspnea  No nausea No numbness or tingling of extremity  No polydipsia No polyuria  No speech difficulty No syncope   She is following a Regular diet. Current exercise: no regular exercise  Last metabolic panel Lab Results  Component Value Date   GLUCOSE 118 (H) 09/14/2020   NA 146 (H) 09/14/2020   K 4.2 09/14/2020   BUN 19 09/14/2020   CREATININE 0.78 09/14/2020   GFRNONAA 75 09/14/2020   GFRAA 86 09/14/2020   CALCIUM 9.9 09/14/2020   AST 24 09/14/2020   ALT 25 09/14/2020   The 10-year ASCVD risk score Mikey Bussing DC Jr., et al., 2013) is: 29.3%      Medications: Outpatient Medications Prior to Visit  Medication Sig   acetaminophen (TYLENOL) 500 MG tablet Take 500 mg by mouth every 6 (six) hours as needed for mild pain or headache.  allopurinol (ZYLOPRIM) 100 MG tablet TAKE 1 TABLET BY MOUTH EVERY DAY   atorvastatin (LIPITOR) 10 MG tablet TAKE 1 TABLET BY MOUTH EVERYDAY AT BEDTIME   benzonatate (TESSALON) 200 MG capsule Take 1 capsule (200 mg total) by mouth 3 (three) times daily.   colchicine 0.6 MG tablet Take 0.6 mg by mouth daily as needed (for gout flare-ups).    Continuous Blood Gluc Receiver (FREESTYLE LIBRE 2 READER) DEVI 1 each by Does not apply route continuous.   Continuous Blood Gluc Sensor (FREESTYLE LIBRE 2 SENSOR) MISC USE AS DIRECTED FOR CONTINUOUS GLUCOSE MONITORING, REPLACE EVERY 14 DAYS   furosemide  (LASIX) 40 MG tablet TAKE 1 TABLET BY MOUTH EVERY DAY   gabapentin (NEURONTIN) 600 MG tablet TAKE 1 TABLET BY MOUTH THREE TIMES A DAY   glimepiride (AMARYL) 4 MG tablet TAKE 1 TABLET (4 MG TOTAL) BY MOUTH 2 (TWO) TIMES DAILY.   JARDIANCE 25 MG TABS tablet TAKE 25 MG BY MOUTH DAILY.   levothyroxine (SYNTHROID) 88 MCG tablet TAKE 1 TABLET BY MOUTH EVERY DAY   menthol-cetylpyridinium (CEPACOL) 3 MG lozenge Take 1 lozenge (3 mg total) by mouth as needed for sore throat.   metFORMIN (GLUCOPHAGE) 1000 MG tablet TAKE 1 TABLET BY MOUTH TWICE A DAY   nystatin cream (MYCOSTATIN) Apply 1 application topically 2 (two) times daily. (Patient taking differently: Apply 1 application topically 2 (two) times daily as needed (for diabetic yeast reactions.).)   ondansetron (ZOFRAN) 4 MG tablet TAKE 1 TABLET BY MOUTH EVERY 8 HOURS AS NEEDED FOR NAUSEA AND VOMITING   pioglitazone (ACTOS) 30 MG tablet TAKE 1 TABLET BY MOUTH EVERY DAY   Semaglutide,0.25 or 0.5MG /DOS, (OZEMPIC, 0.25 OR 0.5 MG/DOSE,) 2 MG/1.5ML SOPN INJECT 0.5MG  INTO THE SKIN ONCE A WEEK.   budesonide (PULMICORT) 0.5 MG/2ML nebulizer solution Take 0.5 mg by nebulization as needed.   guaiFENesin-dextromethorphan (ROBITUSSIN DM) 100-10 MG/5ML syrup Take 5 mLs by mouth every 4 (four) hours as needed for cough. (Patient not taking: Reported on 09/14/2020)   losartan (COZAAR) 25 MG tablet Take 1 tablet (25 mg total) by mouth daily. (Patient not taking: No sig reported)   No facility-administered medications prior to visit.    Review of Systems  Last hemoglobin A1c Lab Results  Component Value Date   HGBA1C 6.9 (A) 09/14/2020       Objective    BP 113/72   Pulse 60   Temp 98.5 F (36.9 C)   Resp 16   Wt 193 lb (87.5 kg)   BMI 35.30 kg/m  BP Readings from Last 3 Encounters:  01/12/21 113/72  09/14/20 106/62  06/01/20 97/68   Wt Readings from Last 3 Encounters:  01/12/21 193 lb (87.5 kg)  09/14/20 192 lb (87.1 kg)  06/01/20 197 lb (89.4 kg)        Physical Exam Vitals reviewed.  Constitutional:      Appearance: She is obese.  HENT:     Head: Normocephalic and atraumatic.     Right Ear: External ear normal.     Left Ear: External ear normal.  Eyes:     General: No scleral icterus.    Conjunctiva/sclera: Conjunctivae normal.  Cardiovascular:     Rate and Rhythm: Normal rate and regular rhythm.     Heart sounds: Normal heart sounds.  Pulmonary:     Breath sounds: Normal breath sounds.  Abdominal:     General: Bowel sounds are normal. There is no distension.     Palpations: Abdomen is  soft.     Tenderness: There is no abdominal tenderness. There is no guarding.  Musculoskeletal:     Right lower leg: No edema.     Left lower leg: No edema.  Skin:    General: Skin is warm and dry.  Neurological:     General: No focal deficit present.     Mental Status: She is alert and oriented to person, place, and time.  Psychiatric:        Mood and Affect: Mood normal.        Behavior: Behavior normal.        Thought Content: Thought content normal.        Judgment: Judgment normal.      No results found for any visits on 01/12/21.  Assessment & Plan     1. Type 2 diabetes mellitus with diabetic neuropathy, without long-term current use of insulin (HCC) Good control with A1c of 7.3.  On Ozempic 0.5 weekly metformin 1000 mg twice a day Jardiance 25 and glimepiride.  Pioglitazone.  Will consider stopping glimepiride in the future in this 75 year old - POCT glycosylated hemoglobin (Hb A1C)  2. Need for COVID-19 vaccine Vaccine given for second booster - PFIZER Comirnaty(GRAY TOP)COVID-19 Vaccine  3. COVID-19 long hauler manifesting chronic fatigue Chronic fatigue and chronic lung damage followed by pulmonary  4. Hypertensive heart disease without heart failure Clinically stable on losartan 25  5. Chronic diastolic CHF (congestive heart failure) (HCC) On losartan  6. Coronary artery disease involving native coronary  artery of native heart without angina pectoris All risk factors treated  7. Chronic bronchitis, unspecified chronic bronchitis type ([place]) Followed by pulmonary  8. Class 2 severe obesity due to excess calories with serious comorbidity and body mass index (BMI) of 36.0 to 36.9 in adult [facility]) Patient has visceral obesity.  Nothing that could be offered by plastic surgery will be appropriate.  9. History of 2019 novel coronavirus disease (COVID-19)   10. HYPERCHOLESTEROLEMIA On Lipitor 10.  Goal LDL less than 70   No follow-ups on file.      I, Wilhemena Durie, MD, have reviewed all documentation for this visit. The documentation on 01/16/21 for the exam, diagnosis, procedures, and orders are all accurate and complete.    Bob Eastwood Cranford Mon, MD  [facility] [phone] (phone) [phone] (fax)  [place]

## 2021-01-12 ENCOUNTER — Other Ambulatory Visit: Payer: Self-pay

## 2021-01-12 ENCOUNTER — Encounter: Payer: Self-pay | Admitting: Family Medicine

## 2021-01-12 ENCOUNTER — Ambulatory Visit (INDEPENDENT_AMBULATORY_CARE_PROVIDER_SITE_OTHER): Payer: PPO | Admitting: Family Medicine

## 2021-01-12 VITALS — BP 113/72 | HR 60 | Temp 98.5°F | Resp 16 | Wt 193.0 lb

## 2021-01-12 DIAGNOSIS — Z6836 Body mass index (BMI) 36.0-36.9, adult: Secondary | ICD-10-CM | POA: Diagnosis not present

## 2021-01-12 DIAGNOSIS — J42 Unspecified chronic bronchitis: Secondary | ICD-10-CM

## 2021-01-12 DIAGNOSIS — U099 Post covid-19 condition, unspecified: Secondary | ICD-10-CM | POA: Diagnosis not present

## 2021-01-12 DIAGNOSIS — I5032 Chronic diastolic (congestive) heart failure: Secondary | ICD-10-CM

## 2021-01-12 DIAGNOSIS — R5382 Chronic fatigue, unspecified: Secondary | ICD-10-CM | POA: Diagnosis not present

## 2021-01-12 DIAGNOSIS — E1140 Type 2 diabetes mellitus with diabetic neuropathy, unspecified: Secondary | ICD-10-CM | POA: Diagnosis not present

## 2021-01-12 DIAGNOSIS — I2510 Atherosclerotic heart disease of native coronary artery without angina pectoris: Secondary | ICD-10-CM

## 2021-01-12 DIAGNOSIS — E7800 Pure hypercholesterolemia, unspecified: Secondary | ICD-10-CM | POA: Diagnosis not present

## 2021-01-12 DIAGNOSIS — Z8616 Personal history of COVID-19: Secondary | ICD-10-CM | POA: Diagnosis not present

## 2021-01-12 DIAGNOSIS — Z23 Encounter for immunization: Secondary | ICD-10-CM | POA: Diagnosis not present

## 2021-01-12 DIAGNOSIS — I119 Hypertensive heart disease without heart failure: Secondary | ICD-10-CM

## 2021-01-13 ENCOUNTER — Ambulatory Visit (INDEPENDENT_AMBULATORY_CARE_PROVIDER_SITE_OTHER): Payer: PPO | Admitting: Podiatry

## 2021-01-13 ENCOUNTER — Other Ambulatory Visit: Payer: Self-pay

## 2021-01-13 ENCOUNTER — Encounter: Payer: Self-pay | Admitting: Podiatry

## 2021-01-13 DIAGNOSIS — M79676 Pain in unspecified toe(s): Secondary | ICD-10-CM

## 2021-01-13 DIAGNOSIS — E1142 Type 2 diabetes mellitus with diabetic polyneuropathy: Secondary | ICD-10-CM | POA: Diagnosis not present

## 2021-01-13 DIAGNOSIS — B351 Tinea unguium: Secondary | ICD-10-CM

## 2021-01-13 DIAGNOSIS — M205X9 Other deformities of toe(s) (acquired), unspecified foot: Secondary | ICD-10-CM | POA: Diagnosis not present

## 2021-01-13 NOTE — Progress Notes (Signed)
This patient returns to my office for at risk foot care.  This patient requires this care by a professional since this patient will be at risk due to having  Covid 19 and diabetes.   This patient is unable to cut nails herself since the patient cannot reach her nails.These nails are painful walking and wearing shoes.  This patient presents for at risk foot care today.  General Appearance  Alert, conversant and in no acute stress.  Vascular  Dorsalis pedis and posterior tibial  pulses are palpable  bilaterally.  Capillary return is within normal limits  bilaterally. Temperature is within normal limits  bilaterally.  Neurologic  Senn-Weinstein monofilament wire test within normal limits  bilaterally. Muscle power within normal limits bilaterally.  Nails Thick disfigured discolored nails with subungual debris  from hallux to fifth toes bilaterally. No evidence of bacterial infection or drainage bilaterally.  Orthopedic  No limitations of motion  feet .  No crepitus or effusions noted.  No bony pathology or digital deformities noted.Dorsal lipping first metatarsal  B/L. Hallux limitus 1st MPJ right foot.  Skin  normotropic skin with no porokeratosis noted bilaterally.  No signs of infections or ulcers noted.     Onychomycosis  Pain in right toes  Pain in left toes  Consent was obtained for treatment procedures.   Mechanical debridement of nails 1-5  bilaterally performed with a nail nipper.  Filed with dremel without incident.    Return office visit     3 months                Told patient to return for periodic foot care and evaluation due to potential at risk complications.   Gardiner Barefoot DPM

## 2021-01-17 LAB — POCT GLYCOSYLATED HEMOGLOBIN (HGB A1C): Hemoglobin A1C: 7.3 % — AB (ref 4.0–5.6)

## 2021-01-20 ENCOUNTER — Telehealth: Payer: Self-pay

## 2021-01-20 NOTE — Progress Notes (Signed)
Chronic Care Management Pharmacy Assistant   Name: Teresa [name]  MRN: [id number] DOB: 10-30-1945  Reason for Encounter:Diabetes Disease State Call.   Recent office visits:  01/12/2021 Dr. Rosanna Randy MD (PCP)   Recent consult visits:  01/13/2021 Gardiner Barefoot DPM (Podiatry)   12/27/2020 Dr. Clayborn Bigness MD (Cardiology)  12/20/2020 Dr.Aleskerov MD (Pulmonology) trelegy trial - stopped due to resolution of symtpoms and cost prohibitive pricing  Hospital visits:  None in previous 6 months  Medications: Outpatient Encounter Medications as of 01/20/2021  Medication Sig   acetaminophen (TYLENOL) 500 MG tablet Take 500 mg by mouth every 6 (six) hours as needed for mild pain or headache.   allopurinol (ZYLOPRIM) 100 MG tablet TAKE 1 TABLET BY MOUTH EVERY DAY   atorvastatin (LIPITOR) 10 MG tablet TAKE 1 TABLET BY MOUTH EVERYDAY AT BEDTIME   benzonatate (TESSALON) 200 MG capsule Take 1 capsule (200 mg total) by mouth 3 (three) times daily.   budesonide (PULMICORT) 0.5 MG/2ML nebulizer solution Take 0.5 mg by nebulization as needed.   colchicine 0.6 MG tablet Take 0.6 mg by mouth daily as needed (for gout flare-ups).    Continuous Blood Gluc Receiver (FREESTYLE LIBRE 2 READER) DEVI 1 each by Does not apply route continuous.   Continuous Blood Gluc Sensor (FREESTYLE LIBRE 2 SENSOR) MISC USE AS DIRECTED FOR CONTINUOUS GLUCOSE MONITORING, REPLACE EVERY 14 DAYS   furosemide (LASIX) 40 MG tablet TAKE 1 TABLET BY MOUTH EVERY DAY   gabapentin (NEURONTIN) 600 MG tablet TAKE 1 TABLET BY MOUTH THREE TIMES A DAY   glimepiride (AMARYL) 4 MG tablet TAKE 1 TABLET (4 MG TOTAL) BY MOUTH 2 (TWO) TIMES DAILY.   guaiFENesin-dextromethorphan (ROBITUSSIN DM) 100-10 MG/5ML syrup Take 5 mLs by mouth every 4 (four) hours as needed for cough. (Patient not taking: Reported on 09/14/2020)   JARDIANCE 25 MG TABS tablet TAKE 25 MG BY MOUTH DAILY.   levothyroxine (SYNTHROID) 88 MCG tablet TAKE 1 TABLET BY MOUTH EVERY DAY    losartan (COZAAR) 25 MG tablet Take 1 tablet (25 mg total) by mouth daily. (Patient not taking: No sig reported)   menthol-cetylpyridinium (CEPACOL) 3 MG lozenge Take 1 lozenge (3 mg total) by mouth as needed for sore throat.   metFORMIN (GLUCOPHAGE) 1000 MG tablet TAKE 1 TABLET BY MOUTH TWICE A DAY   nystatin cream (MYCOSTATIN) Apply 1 application topically 2 (two) times daily. (Patient taking differently: Apply 1 application topically 2 (two) times daily as needed (for diabetic yeast reactions.).)   ondansetron (ZOFRAN) 4 MG tablet TAKE 1 TABLET BY MOUTH EVERY 8 HOURS AS NEEDED FOR NAUSEA AND VOMITING   pioglitazone (ACTOS) 30 MG tablet TAKE 1 TABLET BY MOUTH EVERY DAY   Semaglutide,0.25 or 0.5MG /DOS, (OZEMPIC, 0.25 OR 0.5 MG/DOSE,) 2 MG/1.5ML SOPN INJECT 0.5MG  INTO THE SKIN ONCE A WEEK.   No facility-administered encounter medications on file as of 01/20/2021.    Star Rating Drugs: Atorvastatin 10 mg last filled on 01/05/2021 for [facility] day [facility]  Glimepiride 4 mg last filled on 12/18/2020 for [facility] day [facility] at [facility]. Pioglitazone 30 mg last filled on 12/17/2020 for [facility] day [facility] at [facility] Ozempic 0.5 mg last filled on 12/16/2020 for [facility] day [facility] at [facility]. Metformin 1000 mg last filled on 01/12/2021 for [facility] day [facility] at [facility]. Jardiance 25 mg last filled on 10/30/2020 for [facility] day [facility]. Losartan 25 mg last filled on 03/11/2020 for [facility] day [facility].  Recent Relevant Labs: Lab Results  Component Value Date/Time   HGBA1C 7.3 (A) 01/17/2021 05:11 PM   HGBA1C 6.9 (A) 09/14/2020 11:09 AM   HGBA1C 7.4 (H) 09/18/2019 11:21 AM   HGBA1C 8.4 (H) 05/17/2019 03:05 AM   HGBA1C 7.6 (H) 04/04/2013 10:47 AM   MICROALBUR 50 06/06/2017 11:26 AM   MICROALBUR 20 12/08/2015 10:39 AM    Kidney Function Lab Results  Component Value Date/Time   CREATININE 0.78 09/14/2020 01:03 PM   CREATININE 0.82  06/01/2020 03:14 PM   CREATININE 0.73 05/14/2014 09:56 AM   CREATININE 0.68 04/04/2013 10:47 AM   GFRNONAA 75 09/14/2020 01:03 PM   GFRNONAA >60 05/14/2014 09:56 AM   GFRNONAA >60 04/04/2013 10:47 AM   GFRAA 86 09/14/2020 01:03 PM   GFRAA >60 05/14/2014 09:56 AM   GFRAA >60 04/04/2013 10:47 AM    Current antihyperglycemic regimen:  Glimepiride 4 mg twice daily Jardiance 25 mg daily Metformin 1000 mg twice daily Ozempic 0.5 mg weekly Pioglitazone 30 mg daily What recent interventions/DTPs have been made to improve glycemic control:  None ID  Have there been any recent hospitalizations or ED visits since last visit with CPP? No  I have attempted without success to contact this patient by phone three times to do her Diabetes Disease State call. I left a Voice message for patient to return my call.  LVM 06/20,06/21,06/23   Adherence Review: Is the patient currently on a STATIN medication? Yes Is the patient currently on ACE/ARB medication? No Does the patient have >5 day gap between last estimated fill dates? No  Patient is schedule for a telephone follow up with clinical pharmacist on 03/15/2021 at 2:00 pm.  [place] Pharmacist Assistant [phone]

## 2021-02-02 ENCOUNTER — Other Ambulatory Visit: Payer: Self-pay | Admitting: Family Medicine

## 2021-02-05 ENCOUNTER — Other Ambulatory Visit: Payer: Self-pay | Admitting: Family Medicine

## 2021-02-05 DIAGNOSIS — E119 Type 2 diabetes mellitus without complications: Secondary | ICD-10-CM

## 2021-02-05 NOTE — Telephone Encounter (Signed)
Jardiance: 10/29/20 #90 1 RF Allopurinol: last RF 5/9//22 #90 2 RF

## 2021-02-10 ENCOUNTER — Other Ambulatory Visit: Payer: Self-pay | Admitting: Family Medicine

## 2021-02-10 DIAGNOSIS — E1140 Type 2 diabetes mellitus with diabetic neuropathy, unspecified: Secondary | ICD-10-CM

## 2021-02-10 DIAGNOSIS — E119 Type 2 diabetes mellitus without complications: Secondary | ICD-10-CM

## 2021-03-09 ENCOUNTER — Other Ambulatory Visit: Payer: Self-pay | Admitting: Family Medicine

## 2021-03-14 ENCOUNTER — Telehealth: Payer: Self-pay

## 2021-03-14 NOTE — Progress Notes (Signed)
    Chronic Care Management Pharmacy Assistant   Name: Teresa [name]  MRN: [id number] DOB: Dec 30, 1945   Patient called to reminded of er appointment with [name], CPP on 03/15/2021 @ 1400 via telephone. Patient stated that she has to work tomorrow and this time doesn't work for her. I was able to get her rescheduled with [name] on 04/22/2021 @ 0915  I did ask patient how she was doing and she reports for the most part she is doing well. She stated her main issue is her breathing. Since she has had COVID the pulmonologist informed her the bottom part of her lungs are just not working properly so she stays out of breath most of the day, and by the afternoon she is exhausted. Patient stated that she does have a pulse ox and she is able to check her O2 stats regularly. She stated that her Pulmonary Provider is considering putting her on oxygen. 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 She is taking her medications daily as needed and denies needing any refills at this time.  Star Rating Drug: Ozempic 0.5 last filled on 12/16/2020 for a 84-day supply with [facility] Jardiance 25 mg last filled on 01/31/2021 for a 90-Day supply with [facility] Metformin 1000 mg last filled on 01/12/2021 for a 90-Day supply with [facility] Glimepiride 4 mg last filled on 02/24/2021 for a 90-Day supply with [facility] Atorvastatin 4 mg last filled on 03/14/2021 for a 90-Day supply with [facility] Pioglitazone 30 mg last filled on 02/23/2021 for a 90-Day supply with [facility] Losartan 25 mg Last filled on 03/11/2020 for a 90-Day supply with [facility]  Any gaps in medications fill history? Yes  Care Gaps: Zoster Vaccines- Shingrix INFLUENZA VACCINE   Lynann Bologna, CPA/CMA Clinical Pharmacist Assistant Phone: [phone]

## 2021-03-15 ENCOUNTER — Telehealth: Payer: PPO

## 2021-03-17 ENCOUNTER — Other Ambulatory Visit: Payer: Self-pay | Admitting: Family Medicine

## 2021-03-17 DIAGNOSIS — E1140 Type 2 diabetes mellitus with diabetic neuropathy, unspecified: Secondary | ICD-10-CM

## 2021-03-22 ENCOUNTER — Ambulatory Visit: Payer: PPO | Admitting: Dermatology

## 2021-03-22 ENCOUNTER — Other Ambulatory Visit: Payer: Self-pay

## 2021-03-22 DIAGNOSIS — Z20822 Contact with and (suspected) exposure to covid-19: Secondary | ICD-10-CM | POA: Diagnosis not present

## 2021-03-22 DIAGNOSIS — D1739 Benign lipomatous neoplasm of skin and subcutaneous tissue of other sites: Secondary | ICD-10-CM | POA: Diagnosis not present

## 2021-03-22 DIAGNOSIS — Z01818 Encounter for other preprocedural examination: Secondary | ICD-10-CM | POA: Diagnosis not present

## 2021-03-22 DIAGNOSIS — M898X8 Other specified disorders of bone, other site: Secondary | ICD-10-CM | POA: Diagnosis not present

## 2021-03-22 DIAGNOSIS — M898X9 Other specified disorders of bone, unspecified site: Secondary | ICD-10-CM

## 2021-03-22 DIAGNOSIS — Z03818 Encounter for observation for suspected exposure to other biological agents ruled out: Secondary | ICD-10-CM | POA: Diagnosis not present

## 2021-03-22 DIAGNOSIS — J849 Interstitial pulmonary disease, unspecified: Secondary | ICD-10-CM | POA: Diagnosis not present

## 2021-03-22 DIAGNOSIS — D1720 Benign lipomatous neoplasm of skin and subcutaneous tissue of unspecified limb: Secondary | ICD-10-CM

## 2021-03-22 NOTE — Patient Instructions (Signed)

## 2021-03-22 NOTE — Progress Notes (Signed)
   Follow-Up Visit   Subjective  Teresa [name] is a 75 y.o. female who presents for the following: Growths (Right lower leg x 2, one present for years and one new came up this week. Older growth burns at times, has grown, and turns purple. ). No h/o trauma in area.   The following portions of the chart were reviewed this encounter and updated as appropriate:       Review of Systems:  No other skin or systemic complaints except as noted in HPI or Assessment and Plan.  Objective  Well appearing patient in no apparent distress; mood and affect are within normal limits.  A focused examination was performed including right lower leg. Relevant physical exam findings are noted in the Assessment and Plan.  Right medial upper ankle Rubbery nodule ~1.5 cm, nontender  Right lower pretibia Adherent, firm SQ nodule ~1.0 cm, tender to touch   Assessment & Plan  Lipoma of lower extremity, unspecified laterality Right medial upper ankle  Benign-appearing, observe.    Bony prominence Right lower pretibia  vs other.  Will refer to Dr Bary Castilla for possible biopsy since symptomatic and h/o change.   Varicose Veins/Spider Veins - Dilated blue, purple or red veins at the lower extremities - Reassured - Smaller vessels can be treated by sclerotherapy (a procedure to inject a medicine into the veins to make them disappear) if desired, but the treatment is not covered by insurance. Larger vessels may be covered if symptomatic and we would refer to vascular surgeon if treatment desired.   Return if symptoms worsen or fail to improve.  IJamesetta Orleans, CMA, am acting as scribe for Brendolyn Patty, MD .  Documentation: I have reviewed the above documentation for accuracy and completeness, and I agree with the above.  Brendolyn Patty MD

## 2021-03-30 ENCOUNTER — Other Ambulatory Visit: Payer: Self-pay | Admitting: Family Medicine

## 2021-04-08 ENCOUNTER — Other Ambulatory Visit: Payer: Self-pay | Admitting: Family Medicine

## 2021-04-08 DIAGNOSIS — E1140 Type 2 diabetes mellitus with diabetic neuropathy, unspecified: Secondary | ICD-10-CM

## 2021-04-08 NOTE — Telephone Encounter (Signed)
   Requested medications are on the active medication list yes  Last refill 8/24  Last visit 01/2021  Future visit scheduled 05/2021  Notes to clinic Just filled 03/30/21, please assess.

## 2021-04-12 DIAGNOSIS — D1723 Benign lipomatous neoplasm of skin and subcutaneous tissue of right leg: Secondary | ICD-10-CM | POA: Diagnosis not present

## 2021-04-18 ENCOUNTER — Ambulatory Visit: Payer: PPO | Admitting: Podiatry

## 2021-04-18 ENCOUNTER — Other Ambulatory Visit: Payer: Self-pay

## 2021-04-18 ENCOUNTER — Encounter: Payer: Self-pay | Admitting: Podiatry

## 2021-04-18 DIAGNOSIS — M79676 Pain in unspecified toe(s): Secondary | ICD-10-CM | POA: Diagnosis not present

## 2021-04-18 DIAGNOSIS — E1142 Type 2 diabetes mellitus with diabetic polyneuropathy: Secondary | ICD-10-CM | POA: Diagnosis not present

## 2021-04-18 DIAGNOSIS — M205X9 Other deformities of toe(s) (acquired), unspecified foot: Secondary | ICD-10-CM

## 2021-04-18 DIAGNOSIS — B351 Tinea unguium: Secondary | ICD-10-CM

## 2021-04-18 NOTE — Progress Notes (Signed)
This patient returns to my office for at risk foot care.  This patient requires this care by a professional since this patient will be at risk due to having  Covid 19 and diabetes.   This patient is unable to cut nails herself since the patient cannot reach her nails.These nails are painful walking and wearing shoes.  This patient presents for at risk foot care today.  General Appearance  Alert, conversant and in no acute stress.  Vascular  Dorsalis pedis and posterior tibial  pulses are palpable  bilaterally.  Capillary return is within normal limits  bilaterally. Temperature is within normal limits  bilaterally.  Neurologic  Senn-Weinstein monofilament wire test within normal limits  bilaterally. Muscle power within normal limits bilaterally.  Nails Thick disfigured discolored nails with subungual debris  from hallux to fifth toes bilaterally. No evidence of bacterial infection or drainage bilaterally.  Orthopedic  No limitations of motion  feet .  No crepitus or effusions noted.  No bony pathology or digital deformities noted.Dorsal lipping first metatarsal  B/L. Hallux limitus 1st MPJ right foot.  Skin  normotropic skin with no porokeratosis noted bilaterally.  No signs of infections or ulcers noted.     Onychomycosis  Pain in right toes  Pain in left toes  Consent was obtained for treatment procedures.   Mechanical debridement of nails 1-5  bilaterally performed with a nail nipper.  Filed with dremel without incident.    Return office visit     3 months                Told patient to return for periodic foot care and evaluation due to potential at risk complications.   Gardiner Barefoot DPM

## 2021-04-21 ENCOUNTER — Telehealth: Payer: Self-pay

## 2021-04-21 NOTE — Progress Notes (Signed)
APPOINTMENT REMINDER   Called KARTINA SWEETMAN, No answer, left message of appointment on 04/22/2021 at 9:15 am  via telephone visit with [name], Pharm D. Notified to have all medications, supplements, blood pressure and/or blood sugar logs available during appointment and to return call if need to reschedule.  Star Rating Drug: Ozempic 0.5 last filled on 008/10/2020 for a 84-day supply with [facility] Jardiance 25 mg last filled on 04/09/2021 for a 90-Day supply with [facility] Metformin 1000 mg last filled on 03/21/2021 for a 90-Day supply with [facility] Glimepiride 4 mg last filled on 02/24/2021 for a 90-Day supply with [facility] Atorvastatin 4 mg last filled on 03/14/2021 for a 90-Day supply with [facility] Pioglitazone 30 mg last filled on 02/23/2021 for a 90-Day supply with [facility] Losartan 25 mg Last filled on 03/11/2020 for a 90-Day supply with [place] Pharmacist Assistant [phone]

## 2021-04-22 ENCOUNTER — Ambulatory Visit (INDEPENDENT_AMBULATORY_CARE_PROVIDER_SITE_OTHER): Payer: PPO

## 2021-04-22 DIAGNOSIS — E1140 Type 2 diabetes mellitus with diabetic neuropathy, unspecified: Secondary | ICD-10-CM

## 2021-04-22 DIAGNOSIS — E1159 Type 2 diabetes mellitus with other circulatory complications: Secondary | ICD-10-CM

## 2021-04-22 NOTE — Progress Notes (Signed)
Chronic Care Management Pharmacy Note  04/27/2021 Name:  Teresa [name] MRN:  [id number] DOB:  [birth year]  Summary: Patient presents for CCM follow-up. She was at church so our visit was abbreviated. She continues to have intermittent episodes of hypoglycemia, but unable to download CGM results at this time. She does not want to make any changes during this visit.  Recommendations/Changes made from today's visit: Recommend stopping Pioglitazone due to risk of worsening heart failure.   Plan: CPP follow-up 3 months  Subjective: Teresa [name] is an 75 y.o. year old female who is a primary patient of [name], MD.  The CCM team was consulted for assistance with disease management and care coordination needs.    Engaged with patient by telephone for follow up visit in response to provider referral for pharmacy case management and/or care coordination services.   Consent to Services:  The patient was given information about Chronic Care Management services, agreed to services, and gave verbal consent prior to initiation of services.  Please see initial visit note for detailed documentation.   Patient Care Team: [name], MD as PCP - General (Family Medicine) Gardiner Barefoot, DPM as Consulting Physician (Podiatry) Yolonda Kida, MD as Consulting Physician (Cardiology) Vern Claude, [place] as Social Worker Birder Robson, MD as Referring Physician (Ophthalmology) Ottie Glazier, MD as Consulting Physician (Pulmonary Disease) Germaine Pomfret, [facility] as Pharmacist (Pharmacist) Melrose Nakayama, MD as Consulting Physician (Orthopedic Surgery)  Recent office visits: 01/12/21: Patient presented to Dr. Rosanna Randy for follow-up. A1c improved to 7.3%.  09/13/20:  Patient presented to [facility], LPN for AWV.    Recent consult visits: None noted.   Objective:  Lab Results  Component Value Date   CREATININE 0.78 09/14/2020   BUN 19 09/14/2020    GFRNONAA 75 09/14/2020   GFRAA 86 09/14/2020   NA 146 (H) 09/14/2020   K 4.2 09/14/2020   CALCIUM 9.9 09/14/2020   CO2 22 09/14/2020    Lab Results  Component Value Date/Time   HGBA1C 7.3 (A) 01/17/2021 05:11 PM   HGBA1C 6.9 (A) 09/14/2020 11:09 AM   HGBA1C 7.4 (H) 09/18/2019 11:21 AM   HGBA1C 8.4 (H) 05/17/2019 03:05 AM   HGBA1C 7.6 (H) 04/04/2013 10:47 AM   MICROALBUR 50 06/06/2017 11:26 AM   MICROALBUR 20 12/08/2015 10:39 AM    Last diabetic Eye exam:  Lab Results  Component Value Date/Time   HMDIABEYEEXA No Retinopathy 07/07/2020 12:00 AM    Last diabetic Foot exam: No results found for: HMDIABFOOTEX   Lab Results  Component Value Date   CHOL 153 09/14/2020   HDL 53 09/14/2020   LDLCALC 73 09/14/2020   TRIG 160 (H) 09/14/2020   CHOLHDL 2.9 09/14/2020    Hepatic Function Latest Ref Rng & Units 09/14/2020 06/01/2020 09/18/2019  Total Protein 6.0 - 8.5 g/dL 7.7 - 7.2  Albumin 3.7 - 4.7 g/dL 4.7 4.5 4.6  AST 0 - 40 IU/L 24 - 27  ALT 0 - 32 IU/L 25 - 24  Alk Phosphatase 44 - 121 IU/L 87 - 79  Total Bilirubin 0.0 - 1.2 mg/dL 0.5 - 0.4  Bilirubin, Direct 0.0 - 0.2 mg/dL - - -    Lab Results  Component Value Date/Time   TSH 1.250 09/14/2020 01:03 PM   TSH 1.460 06/01/2020 03:14 PM    CBC Latest Ref Rng & Units 09/14/2020 09/18/2019 06/24/2019  WBC 3.4 - 10.8 x10E3/uL 10.1 8.5 9.3  Hemoglobin 11.1 -  15.9 g/dL 16.1(H) 15.3 14.4  Hematocrit 34.0 - 46.6 % 48.8(H) 45.0 44.5  Platelets 150 - 450 x10E3/uL 267 239 318    No results found for: VD25OH  Clinical ASCVD: Yes  The 10-year ASCVD risk score (Arnett DK, et al., 2019) is: 38.2%   Values used to calculate the score:     Age: 75 years     Sex: Female     Is Non-Hispanic African American: No     Diabetic: Yes     Tobacco smoker: No     Systolic Blood Pressure: 326 mmHg     Is BP treated: Yes     HDL Cholesterol: 53 mg/dL     Total Cholesterol: 153 mg/dL    Depression screen [facility] 2/9 09/13/2020 09/10/2019 01/14/2019   Decreased Interest 3 0 0  Down, Depressed, Hopeless 3 0 1  PHQ - 2 Score 6 0 1  Altered sleeping 3 - -  Tired, decreased energy 3 - -  Change in appetite 3 - -  Feeling bad or failure about yourself  0 - -  Trouble concentrating 0 - -  Moving slowly or fidgety/restless 0 - -  Suicidal thoughts 0 - -  PHQ-9 Score 15 - -  Difficult doing work/chores Not difficult at all - -  Some recent data might be hidden     Social History   Tobacco Use  Smoking Status Never  Smokeless Tobacco Never   BP Readings from Last 3 Encounters:  01/12/21 113/72  09/14/20 106/62  06/01/20 97/68   Pulse Readings from Last 3 Encounters:  01/12/21 60  09/14/20 70  06/01/20 67   Wt Readings from Last 3 Encounters:  01/12/21 193 lb (87.5 kg)  09/14/20 192 lb (87.1 kg)  06/01/20 197 lb (89.4 kg)    Assessment/Interventions: Review of patient past medical history, allergies, medications, health status, including review of consultants reports, laboratory and other test data, was performed as part of comprehensive evaluation and provision of chronic care management services.   SDOH:  (Social Determinants of Health) assessments and interventions performed: SDOH Interventions    Flowsheet Row Most Recent Value  SDOH Interventions   Financial Strain Interventions Intervention Not Indicated        CCM Care Plan  Allergies  Allergen Reactions   Bacitracin-Neomycin-Polymyxin    Clarithromycin Other (See Comments), Nausea Only and Nausea And Vomiting   Codeine     Other reaction(s): Hallucination   Dilaudid  [Hydromorphone Hcl] Nausea And Vomiting   Iodine    Iohexol      Desc: PT HAS HIVES/ITCHING    Neomycin-Bacitracin Zn-Polymyx    Tamiflu  [Oseltamivir Phosphate]     Other reaction(s): Abdominal Pain, Vomiting   Zolpidem Nausea And Vomiting and Other (See Comments)    Hallucinations   Benzalkonium Chloride Itching, Rash and Swelling   Lidocaine Hcl Itching, Rash and Swelling    Morphine Nausea Only, Rash and Nausea And Vomiting   Tape Itching and Rash    Adhesive tape - silicone   Tapentadol Rash    Medications Reviewed Today     Reviewed by Gardiner Barefoot, DPM (Physician) on 04/18/21 at [phone]  Med List Status: <None>   Medication Order Taking? Sig Documenting Provider Last Dose Status Informant  acetaminophen (TYLENOL) 500 MG tablet [id number]  Take 500 mg by mouth every 6 (six) hours as needed for mild pain or headache. [provider]  Active   albuterol (VENTOLIN HFA) 108 (90 Base) MCG/ACT inhaler  [id number]  SMARTSIG:2 Inhalation Via Inhaler Every 6 Hours PRN [provider]  Active   allopurinol (ZYLOPRIM) 100 MG tablet [id number]  TAKE 1 TABLET BY MOUTH EVERY DAY [name], MD  Active   atorvastatin (LIPITOR) 10 MG tablet [id number]  TAKE 1 TABLET BY MOUTH EVERYDAY AT BEDTIME [name], MD  Active   benzonatate (TESSALON) 200 MG capsule [id number]  Take 1 capsule (200 mg total) by mouth 3 (three) times daily. Patrecia Pour, MD  Active   budesonide (PULMICORT) 0.5 MG/2ML nebulizer solution [id number]  Take 0.5 mg by nebulization as needed. [provider]  Expired 10/08/20 2359   colchicine 0.6 MG tablet [id number]  Take 0.6 mg by mouth daily as needed (for gout flare-ups).  [provider]  Active Self  Continuous Blood Gluc Receiver (FREESTYLE LIBRE 2 READER) [place] [id number]  1 each by Does not apply route continuous. [name], MD  Active   Continuous Blood Gluc Sensor (FREESTYLE LIBRE 2 SENSOR) [place] [id number]  USE AS DIRECTED FOR CONTINUOUS GLUCOSE MONITORING, REPLACE EVERY 14 DAYS [name], MD  Active   fluconazole (DIFLUCAN) 100 MG tablet [id number]  Take 100 mg by mouth daily. [provider]  Active   furosemide (LASIX) 40 MG tablet [id number]  TAKE 1 TABLET BY MOUTH EVERY DAY [name], MD  Active   gabapentin (NEURONTIN) 600 MG tablet [id number]  TAKE 1  TABLET BY MOUTH THREE TIMES A DAY [name], MD  Active   glimepiride (AMARYL) 4 MG tablet [id number]  TAKE 1 TABLET (4 MG TOTAL) BY MOUTH 2 (TWO) TIMES DAILY. [name], MD  Active   guaiFENesin-dextromethorphan [facility] DM) 100-10 MG/5ML syrup [id number]  Take 5 mLs by mouth every 4 (four) hours as needed for cough.  Patient not taking: Reported on 09/14/2020   Patrecia Pour, MD  Active   JARDIANCE 25 MG TABS tablet [id number]  TAKE 25 MG BY MOUTH DAILY. [name], MD  Active   levothyroxine (SYNTHROID) 88 MCG tablet [id number]  TAKE 1 TABLET BY MOUTH EVERY DAY [name], MD  Active   losartan-hydrochlorothiazide [facility]) 50-12.5 MG tablet [id number]  Take 1 tablet by mouth daily. [provider]  Active   metFORMIN (GLUCOPHAGE) 1000 MG tablet [id number]  TAKE 1 TABLET BY MOUTH TWICE A DAY [name], MD  Active   mycophenolate (CELLCEPT) 250 MG capsule [id number]  Take 250 mg by mouth every 12 (twelve) hours. [provider]  Active   nystatin cream (MYCOSTATIN) [id number]  Apply 1 application topically 2 (two) times daily.  Patient taking differently: Apply 1 application topically 2 (two) times daily as needed (for diabetic yeast reactions.).   [name], MD  Active   ondansetron [facility]) 4 MG tablet [id number]  TAKE 1 TABLET BY MOUTH EVERY 8 HOURS AS NEEDED FOR NAUSEA AND VOMITING [name], MD  Active   OZEMPIC, 0.25 OR 0.5 MG/DOSE, 2 MG/1.5ML SOPN [id number]  INJECT 0.5MG INTO THE SKIN ONCE A WEEK. [name], MD  Active   pantoprazole (PROTONIX) 20 MG tablet [id number]  Take by mouth. [provider]  Active   pioglitazone (ACTOS) 30 MG tablet [id number]  TAKE 1 TABLET BY MOUTH EVERY DAY [name], MD  Active             Patient Active Problem List  Diagnosis Date Noted   Oxygen desaturation 06/24/2019   Encounter for examination following treatment at  hospital 06/24/2019   History of 2019 novel coronavirus disease (COVID-19) 06/24/2019   Pneumonia due to COVID-19 virus 05/16/2019   Intractable nausea and vomiting 05/16/2019   Chronic diastolic CHF (congestive heart failure) ([place]) 05/16/2019   Encounter for screening colonoscopy    Benign neoplasm of cecum    Diverticulosis of large intestine without diverticulitis    Tortuous colon    Polyp of sigmoid colon    Intestinal lump    Polyneuropathy associated with underlying disease ([place]) 12/11/2016   Microcalcifications of the breast 12/15/2015   Back pain, chronic 06/28/2015   Chronic airway obstruction ([place]) 06/28/2015   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   Essential hypertension 06/21/2009   CAD (coronary artery disease) 06/21/2009   FEVER UNSPECIFIED 06/21/2009   NAUSEA 06/21/2009   DYSPHAGIA 06/21/2009   RUQ PAIN 06/21/2009   HIATAL HERNIA 01/07/2008   Hypothyroidism 11/22/2007   ANXIETY 11/22/2007   DEPRESSION 11/22/2007   Hypertensive heart disease without heart failure 11/22/2007   ANGINA PECTORIS 11/22/2007   RENAL CALCULUS 11/22/2007   Osteoarthritis 11/22/2007   SLEEP APNEA 11/22/2007   HEADACHE, CHRONIC 11/22/2007   GASTRITIS 10/23/2007   DIVERTICULOSIS, COLON 10/23/2007   Diabetes mellitus type II, non insulin dependent ([place]) 07/16/2007   HYPERCHOLESTEROLEMIA 07/16/2007   OBESITY, MODERATE 07/16/2007   ISCHEMIC HEART DISEASE 07/16/2007   ESOPHAGEAL REFLUX 07/16/2007   SCLERODERMA 07/16/2007   DYSPNEA 07/16/2007   COUGH, CHRONIC 07/16/2007    Immunization History  Administered Date(s) Administered   Fluad Quad(high Dose 65+) 05/27/2019, 06/01/2020   Influenza, High Dose Seasonal PF 05/29/2016, 06/06/2017, 05/20/2018   PFIZER Comirnaty(Gray Top)Covid-19 Tri-Sucrose Vaccine 10/05/2020, 01/12/2021   PFIZER(Purple Top)SARS-COV-2 Vaccination 09/14/2020   Pneumococcal Conjugate-13 05/21/2014   Pneumococcal Polysaccharide-23 11/29/2004, 07/11/2010,  12/08/2015, 05/27/2019   Td 07/05/1994   Tdap 05/11/2011   Zoster, Live 05/07/2008    Conditions to be addressed/monitored:  CAD, HTN, DMII, Anxiety, Depression and Hypothyoridism, and Chronic Pain  Care Plan : General Pharmacy (Adult)  Updates made by Germaine Pomfret, RPH since 04/27/2021 12:00 AM     Problem: CAD, HTN, DMII, Anxiety, Depression and Hypothyoridism, and Chronic Pain   Priority: High     Long-Range Goal: Patient-Specific Goal   Start Date: 09/15/2020  Expected End Date: 04/27/2022  This Visit's Progress: On track  Recent Progress: On track  Priority: High  Note:   Current Barriers:  No barriers noted   Pharmacist Clinical Goal(s):  Over the next 90 days, patient will maintain control of Diabetes as evidenced by A1c less than 8%  through collaboration with PharmD and provider.   Interventions: 1:1 collaboration with [name], MD regarding development and update of comprehensive plan of care as evidenced by provider attestation and co-signature Inter-disciplinary care team collaboration (see longitudinal plan of care) Comprehensive medication review performed; medication list updated in electronic medical record  Hypertension (BP goal <140/90) -controlled -Current treatment: Furosemide 40 mg daily  -Medications previously tried: HCTZ  -Current home readings: 120/72, 106/68  -Denies hypotensive/hypertensive symptoms Recommended to continue current medication  Hyperlipidemia: (LDL goal < 70) -uncontrolled -Current treatment: Atorvastatin 10 mg daily  -Medications previously tried: NA  -Educated on Benefits of statin for ASCVD risk reduction; -Recommended to continue current medication  Diabetes (A1c goal <8%) -controlled -Current medications: Glimepiride 4 mg twice daily  Jardiance 25 mg daily  Metformin 1000 mg twice daily  Ozempic 0.5 mg  weekly (Thursday)  Pioglitazone 30 mg daily  -Medications previously tried: Januvia   -Current home glucose readings fasting glucose:  post prandial glucose: 110-120 -Reports multiple instances of hypoglycemic symptoms: weak, dizziness, shakiness. These typically occur in the middle of the night and she will treat with a snack.  -Educated on Prevention and management of hypoglycemic episodes; -Recommended stopping pioglitazone due to risk of hypogylcemia and heart failure; patient did not want to make any changes at this time.   Patient Goals/Self-Care Activities Over the next 90 days, patient will:  - check glucose at least every 8 hours, document, and provide at future appointments  Follow Up Plan: Face to Face appointment with care management team member scheduled for: 07/13/2021 at 3:00 PM      Medication Assistance:  Patient reports her medication costs are expensive, she is currently enrolled in full LIS coverage, so does not qualify for patient assistance applications.  Patient's preferred pharmacy is:  [id number] [id number]: [phone]: [phone] Uses pill box? Yes Pt endorses 100% compliance  We discussed: Current pharmacy is preferred with insurance plan and patient is satisfied with pharmacy services Patient decided to: Continue current medication management strategy  Follow Up:  Patient agrees to Care Plan and Follow-up.  Plan: Face to Face appointment with care management team member scheduled for: 07/13/2021 at 3:00 PM  [phone]

## 2021-04-25 ENCOUNTER — Other Ambulatory Visit: Payer: Self-pay | Admitting: Family Medicine

## 2021-04-25 NOTE — Telephone Encounter (Signed)
Requested medications are due for refill today requesting too soon, just filled 8/27 for 90 day supply  Requested medications are on the active medication list yes  Last refill 04/02/21  Last visit 01/2021, last valid lab, Uric Acid, from 2018  Future visit scheduled 05/2021  Notes to clinic requesting early as well as failed protocol due to lab greater than 360 days ago, it was from 2018.

## 2021-04-27 NOTE — Patient Instructions (Signed)
Visit Information It was great speaking with you today!  Please let me know if you have any questions about our visit.   Goals Addressed             This Visit's Progress    Monitor and Manage My Blood Sugar-Diabetes Type 2   On track    Timeframe:  Long-Range Goal Priority:  High Start Date:       09/15/2020                      Expected End Date:  03/15/2022                  Follow Up within 30 days  - check blood sugar 5-10 times daily  - check blood sugar if I feel it is too high or too low - take the blood sugar meter to all doctor visits    Why is this important?   Checking your blood sugar at home helps to keep it from getting very high or very low.  Writing the results in a diary or log helps the doctor know how to care for you.  Your blood sugar log should have the time, date and the results.  Also, write down the amount of insulin or other medicine that you take.  Other information, like what you ate, exercise done and how you were feeling, will also be helpful.     Notes:         Patient Care Plan: General Pharmacy (Adult)     Problem Identified: CAD, HTN, DMII, Anxiety, Depression and Hypothyoridism, and Chronic Pain   Priority: High     Long-Range Goal: Patient-Specific Goal   Start Date: 09/15/2020  Expected End Date: 04/27/2022  This Visit's Progress: On track  Recent Progress: On track  Priority: High  Note:   Current Barriers:  No barriers noted   Pharmacist Clinical Goal(s):  Over the next 90 days, patient will maintain control of Diabetes as evidenced by A1c less than 8%  through collaboration with PharmD and provider.   Interventions: 1:1 collaboration with [name], MD regarding development and update of comprehensive plan of care as evidenced by provider attestation and co-signature Inter-disciplinary care team collaboration (see longitudinal plan of care) Comprehensive medication review performed; medication list updated in  electronic medical record  Hypertension (BP goal <140/90) -controlled -Current treatment: Furosemide 40 mg daily  -Medications previously tried: HCTZ  -Current home readings: 120/72, 106/68  -Denies hypotensive/hypertensive symptoms Recommended to continue current medication  Hyperlipidemia: (LDL goal < 70) -uncontrolled -Current treatment: Atorvastatin 10 mg daily  -Medications previously tried: NA  -Educated on Benefits of statin for ASCVD risk reduction; -Recommended to continue current medication  Diabetes (A1c goal <8%) -controlled -Current medications: Glimepiride 4 mg twice daily  Jardiance 25 mg daily  Metformin 1000 mg twice daily  Ozempic 0.5 mg weekly (Thursday)  Pioglitazone 30 mg daily  -Medications previously tried: Januvia  -Current home glucose readings fasting glucose:  post prandial glucose: 110-120 -Reports multiple instances of hypoglycemic symptoms: weak, dizziness, shakiness. These typically occur in the middle of the night and she will treat with a snack.  -Educated on Prevention and management of hypoglycemic episodes; -Recommended stopping pioglitazone due to risk of hypogylcemia and heart failure; patient did not want to make any changes at this time.   Patient Goals/Self-Care Activities Over the next 90 days, patient will:  - check glucose at least every 8 hours, document,  and provide at future appointments  Follow Up Plan: Face to Face appointment with care management team member scheduled for: 07/13/2021 at 3:00 PM    Patient agreed to services and verbal consent obtained.   Patient verbalizes understanding of instructions provided today and agrees to view in [place].   [name], PharmD, [place], [place] [phone]

## 2021-05-06 DIAGNOSIS — I152 Hypertension secondary to endocrine disorders: Secondary | ICD-10-CM | POA: Diagnosis not present

## 2021-05-06 DIAGNOSIS — E1140 Type 2 diabetes mellitus with diabetic neuropathy, unspecified: Secondary | ICD-10-CM | POA: Diagnosis not present

## 2021-05-06 DIAGNOSIS — E1159 Type 2 diabetes mellitus with other circulatory complications: Secondary | ICD-10-CM | POA: Diagnosis not present

## 2021-05-13 ENCOUNTER — Other Ambulatory Visit: Payer: Self-pay | Admitting: Family Medicine

## 2021-05-13 DIAGNOSIS — E1140 Type 2 diabetes mellitus with diabetic neuropathy, unspecified: Secondary | ICD-10-CM

## 2021-05-16 ENCOUNTER — Encounter: Payer: Self-pay | Admitting: Family Medicine

## 2021-05-16 ENCOUNTER — Other Ambulatory Visit: Payer: Self-pay

## 2021-05-16 ENCOUNTER — Ambulatory Visit (INDEPENDENT_AMBULATORY_CARE_PROVIDER_SITE_OTHER): Payer: PPO | Admitting: Family Medicine

## 2021-05-16 VITALS — BP 106/64 | HR 72 | Temp 98.4°F | Resp 18 | Ht 62.0 in | Wt 195.0 lb

## 2021-05-16 DIAGNOSIS — Z1231 Encounter for screening mammogram for malignant neoplasm of breast: Secondary | ICD-10-CM | POA: Diagnosis not present

## 2021-05-16 DIAGNOSIS — E1140 Type 2 diabetes mellitus with diabetic neuropathy, unspecified: Secondary | ICD-10-CM

## 2021-05-16 DIAGNOSIS — G63 Polyneuropathy in diseases classified elsewhere: Secondary | ICD-10-CM | POA: Diagnosis not present

## 2021-05-16 DIAGNOSIS — I10 Essential (primary) hypertension: Secondary | ICD-10-CM | POA: Diagnosis not present

## 2021-05-16 DIAGNOSIS — E7800 Pure hypercholesterolemia, unspecified: Secondary | ICD-10-CM

## 2021-05-16 DIAGNOSIS — E039 Hypothyroidism, unspecified: Secondary | ICD-10-CM

## 2021-05-16 DIAGNOSIS — Z23 Encounter for immunization: Secondary | ICD-10-CM | POA: Diagnosis not present

## 2021-05-16 MED ORDER — FREESTYLE LIBRE 2 SENSOR MISC: 12 refills | Status: DC

## 2021-05-16 MED ORDER — GABAPENTIN 800 MG PO TABS: 800.0000 mg | ORAL_TABLET | Freq: Three times a day (TID) | ORAL | 3 refills | Status: DC

## 2021-05-16 NOTE — Progress Notes (Signed)
I,[name],acting as a scribe for Wilhemena Durie, MD.,have documented all relevant documentation on the behalf of Wilhemena Durie, MD,as directed by  Wilhemena Durie, MD while in the presence of Wilhemena Durie, MD.   Established patient visit   Patient: Teresa [name]   DOB: 04/11/1946   75 y.o. Female  MRN: [id number] Visit Date: 05/16/2021  Today's healthcare provider: Wilhemena Durie, MD   Chief Complaint  Patient presents with   Follow-up   Diabetes   Hypertension   Hyperlipidemia   Subjective    HPI  Patient has chronic complaints and is doing well with those.  She has a chronic headache on top of her head and feels like there is a growth there.  She has pins-and-needles in her hands and feet and has been on gabapentin 600 mg 3 times daily for this.  Her freestyle [name] is helping her but she is only been able to get up for like 2 weeks at a time.  We need to redo the order and correct this. Diabetes Mellitus Type II, follow-up  Lab Results  Component Value Date   HGBA1C 7.3 (A) 01/17/2021   HGBA1C 6.9 (A) 09/14/2020   HGBA1C 6.9 (A) 06/01/2020   Last seen for diabetes 4 months ago.  Management since then includes; Good control with A1c of 7.3.  On Ozempic 0.5 weekly metformin 1000 mg twice a day Jardiance 25 and glimepiride.  Pioglitazone.  Will consider stopping glimepiride in the future in this 75 year old She reports good compliance with treatment. She is not having side effects. none  Home blood sugar records: fasting range: 106  Episodes of hypoglycemia? No none   Current insulin regiment: Ozempic 0.5 Most Recent Eye Exam: 07/07/2020  --------------------------------------------------------------------------------------------------- Hypertension, follow-up  BP Readings from Last 3 Encounters:  05/16/21 106/64  01/12/21 113/72  09/14/20 106/62   Wt Readings from Last 3 Encounters:  05/16/21 195 lb (88.5 kg)  01/12/21 193 lb (87.5  kg)  09/14/20 192 lb (87.1 kg)     She was last seen for hypertension 4 months ago.  BP at that visit was 113/72. Management since that visit includes; controlled. She reports good compliance with treatment. She is not having side effects. none She is not exercising. She is adherent to low salt diet.   Outside blood pressures are normal.  She does not smoke.  Use of agents associated with hypertension: none.   --------------------------------------------------------------------------------------------------- Lipid/Cholesterol, follow-up  Last Lipid Panel: Lab Results  Component Value Date   CHOL 153 09/14/2020   LDLCALC 73 09/14/2020   HDL 53 09/14/2020   TRIG 160 (H) 09/14/2020    She was last seen for this 4 months ago.  Management since that visit includes; on atorvastatin.  She reports good compliance with treatment. She is not having side effects. none  She is following a Regular diet. Current exercise: none  Last metabolic panel Lab Results  Component Value Date   GLUCOSE 118 (H) 09/14/2020   NA 146 (H) 09/14/2020   K 4.2 09/14/2020   BUN 19 09/14/2020   CREATININE 0.78 09/14/2020   GFRNONAA 75 09/14/2020   CALCIUM 9.9 09/14/2020   AST 24 09/14/2020   ALT 25 09/14/2020   The 10-year ASCVD risk score (Arnett DK, et al., 2019) is: 26.3%  ---------------------------------------------------------------------------------------------------     Medications: Outpatient Medications Prior to Visit  Medication Sig   acetaminophen (TYLENOL) 500 MG tablet Take 500 mg by mouth every 6 (  six) hours as needed for mild pain or headache.   albuterol (VENTOLIN HFA) 108 (90 Base) MCG/ACT inhaler SMARTSIG:2 Inhalation Via Inhaler Every 6 Hours PRN   allopurinol (ZYLOPRIM) 100 MG tablet TAKE 1 TABLET BY MOUTH EVERY DAY   atorvastatin (LIPITOR) 10 MG tablet TAKE 1 TABLET BY MOUTH EVERYDAY AT BEDTIME   benzonatate (TESSALON) 200 MG capsule Take 1 capsule (200 mg total)  by mouth 3 (three) times daily.   colchicine 0.6 MG tablet Take 0.6 mg by mouth daily as needed (for gout flare-ups).    Continuous Blood Gluc Receiver (FREESTYLE LIBRE 2 READER) DEVI 1 each by Does not apply route continuous.   fluconazole (DIFLUCAN) 100 MG tablet Take 100 mg by mouth daily.   furosemide (LASIX) 40 MG tablet TAKE 1 TABLET BY MOUTH EVERY DAY   glimepiride (AMARYL) 4 MG tablet TAKE 1 TABLET (4 MG TOTAL) BY MOUTH 2 (TWO) TIMES DAILY.   JARDIANCE 25 MG TABS tablet TAKE 25 MG BY MOUTH DAILY.   levothyroxine (SYNTHROID) 88 MCG tablet TAKE 1 TABLET BY MOUTH EVERY DAY   metFORMIN (GLUCOPHAGE) 1000 MG tablet TAKE 1 TABLET BY MOUTH TWICE A DAY   mycophenolate (CELLCEPT) 250 MG capsule Take 250 mg by mouth every 12 (twelve) hours.   nystatin cream (MYCOSTATIN) Apply 1 application topically 2 (two) times daily. (Patient taking differently: Apply 1 application topically 2 (two) times daily as needed (for diabetic yeast reactions.).)   ondansetron (ZOFRAN) 4 MG tablet TAKE 1 TABLET BY MOUTH EVERY 8 HOURS AS NEEDED FOR NAUSEA AND VOMITING   OZEMPIC, 0.25 OR 0.5 MG/DOSE, 2 MG/1.5ML SOPN INJECT 0.5MG  INTO THE SKIN ONCE A WEEK.   pantoprazole (PROTONIX) 20 MG tablet Take by mouth.   pioglitazone (ACTOS) 30 MG tablet TAKE 1 TABLET BY MOUTH EVERY DAY   sulfamethoxazole-trimethoprim (BACTRIM) 400-80 MG tablet Take 1 tablet by mouth 3 (three) times a week.   [DISCONTINUED] Continuous Blood Gluc Sensor (FREESTYLE LIBRE 2 SENSOR) MISC USE AS DIRECTED FOR CONTINUOUS GLUCOSE MONITORING, REPLACE EVERY 14 DAYS   [DISCONTINUED] gabapentin (NEURONTIN) 600 MG tablet TAKE 1 TABLET BY MOUTH THREE TIMES A DAY   budesonide (PULMICORT) 0.5 MG/2ML nebulizer solution Take 0.5 mg by nebulization as needed.   [DISCONTINUED] guaiFENesin-dextromethorphan (ROBITUSSIN DM) 100-10 MG/5ML syrup Take 5 mLs by mouth every 4 (four) hours as needed for cough. (Patient not taking: No sig reported)   [DISCONTINUED]  losartan-hydrochlorothiazide (HYZAAR) 50-12.5 MG tablet Take 1 tablet by mouth daily.   No facility-administered medications prior to visit.    Review of Systems  Constitutional:  Negative for appetite change, chills, fatigue and fever.  Respiratory:  Negative for chest tightness and shortness of breath.   Cardiovascular:  Negative for chest pain and palpitations.  Gastrointestinal:  Negative for abdominal pain, nausea and vomiting.  Neurological:  Negative for dizziness and weakness.       Objective    BP 106/64 (BP Location: Right Arm, Patient Position: Sitting, Cuff Size: Normal)   Pulse 72   Temp 98.4 F (36.9 C) (Oral)   Resp 18   Ht 5\' 2"  (1.575 m)   Wt 195 lb (88.5 kg)   SpO2 99%   BMI 35.67 kg/m  BP Readings from Last 3 Encounters:  05/16/21 106/64  01/12/21 113/72  09/14/20 106/62   Wt Readings from Last 3 Encounters:  05/16/21 195 lb (88.5 kg)  01/12/21 193 lb (87.5 kg)  09/14/20 192 lb (87.1 kg)      Physical Exam Vitals  reviewed.  Constitutional:      Appearance: She is obese.  HENT:     Head: Normocephalic and atraumatic.     Right Ear: External ear normal.     Left Ear: External ear normal.  Eyes:     General: No scleral icterus.    Conjunctiva/sclera: Conjunctivae normal.  Cardiovascular:     Rate and Rhythm: Normal rate and regular rhythm.     Heart sounds: Normal heart sounds.  Pulmonary:     Breath sounds: Normal breath sounds.  Abdominal:     General: Bowel sounds are normal. There is no distension.     Palpations: Abdomen is soft.     Tenderness: There is no abdominal tenderness. There is no guarding.  Musculoskeletal:     Right lower leg: No edema.     Left lower leg: No edema.  Skin:    General: Skin is warm and dry.  Neurological:     General: No focal deficit present.     Mental Status: She is alert and oriented to person, place, and time.  Psychiatric:        Mood and Affect: Mood normal.        Behavior: Behavior normal.         Thought Content: Thought content normal.        Judgment: Judgment normal.      No results found for any visits on 05/16/21.  Assessment & Plan     1. Type 2 diabetes mellitus with diabetic neuropathy, without long-term current use of insulin (HCC) She has been doing better since she has had the continuous glucose monitor.  Continue glimepiride but if her blood sugar gets below 7.5 days once he may discontinue this.  Continue Jardiance and metformin and Ozempic and Actos - Lipid panel - Hemoglobin A1c - Comprehensive Metabolic Panel (CMET) - CBC w/Diff/Platelet - TSH - Continuous Blood Gluc Sensor (FREESTYLE LIBRE 2 SENSOR) MISC; USE AS DIRECTED  Dispense: 4 each; Refill: 12  2. Pure hypercholesterolemia On atorvastatin.  Consider increasing dose - Lipid panel - Hemoglobin A1c - Comprehensive Metabolic Panel (CMET) - CBC w/Diff/Platelet - TSH  3. Acquired hypothyroidism  - Lipid panel - Hemoglobin A1c - Comprehensive Metabolic Panel (CMET) - CBC w/Diff/Platelet - TSH  4. Essential hypertension Good control on losartan HCT - Lipid panel - Hemoglobin A1c - Comprehensive Metabolic Panel (CMET) - CBC w/Diff/Platelet - TSH  5. Need for influenza vaccination  - Flu Vaccine QUAD High Dose(Fluad)  6. Polyneuropathy associated with underlying disease ([place]) Increase gabapentin from 600 to 800 mg 3 times daily. - gabapentin (NEURONTIN) 800 MG tablet; Take 1 tablet (800 mg total) by mouth 3 (three) times daily.  Dispense: 270 tablet; Refill: 3   Return in about 4 months (around 09/16/2021).      I, Wilhemena Durie, MD, have reviewed all documentation for this visit. The documentation on 05/23/21 for the exam, diagnosis, procedures, and orders are all accurate and complete.    Stanislaw Acton Cranford Mon, MD  [facility] [phone] (phone) [phone] (fax)  [place]

## 2021-05-17 LAB — COMPREHENSIVE METABOLIC PANEL
ALT: 20 IU/L (ref 0–32)
AST: 24 IU/L (ref 0–40)
Albumin/Globulin Ratio: 2 (ref 1.2–2.2)
Albumin: 4.8 g/dL — ABNORMAL HIGH (ref 3.7–4.7)
Alkaline Phosphatase: 115 IU/L (ref 44–121)
BUN/Creatinine Ratio: 19 (ref 12–28)
BUN: 14 mg/dL (ref 8–27)
Bilirubin Total: 0.4 mg/dL (ref 0.0–1.2)
CO2: 21 mmol/L (ref 20–29)
Calcium: 9.8 mg/dL (ref 8.7–10.3)
Chloride: 100 mmol/L (ref 96–106)
Creatinine, Ser: 0.73 mg/dL (ref 0.57–1.00)
Globulin, Total: 2.4 g/dL (ref 1.5–4.5)
Glucose: 148 mg/dL — ABNORMAL HIGH (ref 70–99)
Potassium: 4.1 mmol/L (ref 3.5–5.2)
Sodium: 141 mmol/L (ref 134–144)
Total Protein: 7.2 g/dL (ref 6.0–8.5)
eGFR: 86 mL/min/{1.73_m2} (ref >59)

## 2021-05-17 LAB — CBC WITH DIFFERENTIAL/PLATELET
Basophils Absolute: 0.1 10*3/uL (ref 0.0–0.2)
Basos: 1 %
EOS (ABSOLUTE): 0.2 10*3/uL (ref 0.0–0.4)
Eos: 3 %
Hematocrit: 47.1 % — ABNORMAL HIGH (ref 34.0–46.6)
Hemoglobin: 15.9 g/dL (ref 11.1–15.9)
Immature Grans (Abs): 0 10*3/uL (ref 0.0–0.1)
Immature Granulocytes: 0 %
Lymphocytes Absolute: 2.9 10*3/uL (ref 0.7–3.1)
Lymphs: 36 %
MCH: 29.9 pg (ref 26.6–33.0)
MCHC: 33.8 g/dL (ref 31.5–35.7)
MCV: 89 fL (ref 79–97)
Monocytes Absolute: 0.5 10*3/uL (ref 0.1–0.9)
Monocytes: 6 %
Neutrophils Absolute: 4.5 10*3/uL (ref 1.4–7.0)
Neutrophils: 54 %
Platelets: 236 10*3/uL (ref 150–450)
RBC: 5.32 x10E6/uL — ABNORMAL HIGH (ref 3.77–5.28)
RDW: 13.2 % (ref 11.7–15.4)
WBC: 8.2 10*3/uL (ref 3.4–10.8)

## 2021-05-17 LAB — LIPID PANEL
Chol/HDL Ratio: 3.7 ratio (ref 0.0–4.4)
Cholesterol, Total: 182 mg/dL (ref 100–199)
HDL: 49 mg/dL (ref >39)
LDL Chol Calc (NIH): 95 mg/dL (ref 0–99)
Triglycerides: 225 mg/dL — ABNORMAL HIGH (ref 0–149)
VLDL Cholesterol Cal: 38 mg/dL (ref 5–40)

## 2021-05-17 LAB — TSH: TSH: 1.23 u[IU]/mL (ref 0.450–4.500)

## 2021-05-17 LAB — HEMOGLOBIN A1C
Est. average glucose Bld gHb Est-mCnc: 189 mg/dL
Hgb A1c MFr Bld: 8.2 % — ABNORMAL HIGH (ref 4.8–5.6)

## 2021-05-25 ENCOUNTER — Telehealth: Payer: Self-pay

## 2021-05-25 NOTE — Progress Notes (Signed)
Chronic Care Management Pharmacy Assistant   Name: Teresa [name]  MRN: [id number] DOB: 06-Jun-1946  Reason for Encounter:Hypertension Disease State Call.   Recent office visits:  05/16/2021 Dr.Gilbert MD (PCP)  Continue glimepiride but if her blood sugar gets below 7.5 days once he may discontinue this,Increase gabapentin from 600 to 800 mg 3 times daily,Return in about 4 months   Recent consult visits:  No recent [facility] visits:  None in previous 6 months  Medications: Outpatient Encounter Medications as of 05/25/2021  Medication Sig   acetaminophen (TYLENOL) 500 MG tablet Take 500 mg by mouth every 6 (six) hours as needed for mild pain or headache.   albuterol (VENTOLIN HFA) 108 (90 Base) MCG/ACT inhaler SMARTSIG:2 Inhalation Via Inhaler Every 6 Hours PRN   allopurinol (ZYLOPRIM) 100 MG tablet TAKE 1 TABLET BY MOUTH EVERY DAY   atorvastatin (LIPITOR) 10 MG tablet TAKE 1 TABLET BY MOUTH EVERYDAY AT BEDTIME   benzonatate (TESSALON) 200 MG capsule Take 1 capsule (200 mg total) by mouth 3 (three) times daily.   budesonide (PULMICORT) 0.5 MG/2ML nebulizer solution Take 0.5 mg by nebulization as needed.   colchicine 0.6 MG tablet Take 0.6 mg by mouth daily as needed (for gout flare-ups).    Continuous Blood Gluc Receiver (FREESTYLE LIBRE 2 READER) DEVI 1 each by Does not apply route continuous.   Continuous Blood Gluc Sensor (FREESTYLE LIBRE 2 SENSOR) MISC USE AS DIRECTED   fluconazole (DIFLUCAN) 100 MG tablet Take 100 mg by mouth daily.   furosemide (LASIX) 40 MG tablet TAKE 1 TABLET BY MOUTH EVERY DAY   gabapentin (NEURONTIN) 800 MG tablet Take 1 tablet (800 mg total) by mouth 3 (three) times daily.   glimepiride (AMARYL) 4 MG tablet TAKE 1 TABLET (4 MG TOTAL) BY MOUTH 2 (TWO) TIMES DAILY.   JARDIANCE 25 MG TABS tablet TAKE 25 MG BY MOUTH DAILY.   levothyroxine (SYNTHROID) 88 MCG tablet TAKE 1 TABLET BY MOUTH EVERY DAY   metFORMIN (GLUCOPHAGE) 1000 MG tablet TAKE 1  TABLET BY MOUTH TWICE A DAY   mycophenolate (CELLCEPT) 250 MG capsule Take 250 mg by mouth every 12 (twelve) hours.   nystatin cream (MYCOSTATIN) Apply 1 application topically 2 (two) times daily. (Patient taking differently: Apply 1 application topically 2 (two) times daily as needed (for diabetic yeast reactions.).)   ondansetron (ZOFRAN) 4 MG tablet TAKE 1 TABLET BY MOUTH EVERY 8 HOURS AS NEEDED FOR NAUSEA AND VOMITING   OZEMPIC, 0.25 OR 0.5 MG/DOSE, 2 MG/1.5ML SOPN INJECT 0.5MG  INTO THE SKIN ONCE A WEEK.   pantoprazole (PROTONIX) 20 MG tablet Take by mouth.   pioglitazone (ACTOS) 30 MG tablet TAKE 1 TABLET BY MOUTH EVERY DAY   sulfamethoxazole-trimethoprim (BACTRIM) 400-80 MG tablet Take 1 tablet by mouth 3 (three) times a week.   No facility-administered encounter medications on file as of 05/25/2021.    Care Gaps: Shingrix Vaccine Urine Microalbumin (Last Completed 06/06/2017) Tetanus Vaccine (last Completed 05/11/2011) COVID-19 (4- Booster for [employer] series) Star Rating Drug: Ozempic 0.5 last filled on 008/10/2020 for a 84-day supply with [facility] Jardiance 25 mg last filled on 04/09/2021 for a 90-Day supply with [facility] Metformin 1000 mg last filled on 03/21/2021 for a 90-Day supply with [facility] Glimepiride 4 mg last filled on 02/24/2021 for a 90-Day supply with [facility] Atorvastatin 4 mg last filled on 03/14/2021 for a 90-Day supply with [facility] Pioglitazone 30 mg last filled on 02/23/2021 for a 90-Day  supply with [facility] Medication Fill Gaps: Furosemide (LASIX) 40 MG last filled 01/22/2021 90 day supply  Reviewed chart prior to disease state call. Spoke with patient regarding BP  Recent Office Vitals: BP Readings from Last 3 Encounters:  05/16/21 106/64  01/12/21 113/72  09/14/20 106/62   Pulse Readings from Last 3 Encounters:  05/16/21 72  01/12/21 60  09/14/20 70    Wt Readings from Last 3 Encounters:  05/16/21 195 lb (88.5 kg)   01/12/21 193 lb (87.5 kg)  09/14/20 192 lb (87.1 kg)     Kidney Function Lab Results  Component Value Date/Time   CREATININE 0.73 05/16/2021 09:40 AM   CREATININE 0.78 09/14/2020 01:03 PM   CREATININE 0.73 05/14/2014 09:56 AM   CREATININE 0.68 04/04/2013 10:47 AM   GFRNONAA 75 09/14/2020 01:03 PM   GFRNONAA >60 05/14/2014 09:56 AM   GFRNONAA >60 04/04/2013 10:47 AM   GFRAA 86 09/14/2020 01:03 PM   GFRAA >60 05/14/2014 09:56 AM   GFRAA >60 04/04/2013 10:47 AM    BMP Latest Ref Rng & Units 05/16/2021 09/14/2020 06/01/2020  Glucose 70 - 99 mg/dL 148(H) 118(H) 124(H)  BUN 8 - 27 mg/dL 14 19 20   Creatinine 0.57 - 1.00 mg/dL 0.73 0.78 0.82  BUN/Creat Ratio 12 - 28 19 24 24   Sodium 134 - 144 mmol/L 141 146(H) 141  Potassium 3.5 - 5.2 mmol/L 4.1 4.2 4.1  Chloride 96 - 106 mmol/L 100 100 98  CO2 20 - 29 mmol/L 21 22 24   Calcium 8.7 - 10.3 mg/dL 9.8 9.9 9.2    Current antihypertensive regimen:  Furosemide 40 mg daily   What recent interventions/DTPs have been made by any provider to improve Blood Pressure control since last CPP Visit: none Any recent hospitalizations or ED visits since last visit with CPP? No  I have attempted without success to contact this patient by phone three times to do her hypertension. Disease State call. I left a Voice message for patient to return my call. LVM 10/19,10/24,10/25.  Adherence Review: Is the patient currently on ACE/ARB medication? No Does the patient have >5 day gap between last estimated fill dates? No  Face to Face appointment with care management team member scheduled for: 08/02/2021 at 3:00 PM  [place] Pharmacist Assistant [phone]

## 2021-06-10 ENCOUNTER — Telehealth: Payer: Self-pay

## 2021-06-10 NOTE — Progress Notes (Signed)
Chronic Care Management Pharmacy Assistant   Name: Teresa [name]  MRN: [id number] DOB: 1946-01-18  Reason for Encounter:Diabetes Disease State Call.   Recent office visits:  No recent office visit  Recent consult visits:  No recent consult visit  Hospital visits:  None in previous 6 months  Medications: Outpatient Encounter Medications as of 06/10/2021  Medication Sig   acetaminophen (TYLENOL) 500 MG tablet Take 500 mg by mouth every 6 (six) hours as needed for mild pain or headache.   albuterol (VENTOLIN HFA) 108 (90 Base) MCG/ACT inhaler SMARTSIG:2 Inhalation Via Inhaler Every 6 Hours PRN   allopurinol (ZYLOPRIM) 100 MG tablet TAKE 1 TABLET BY MOUTH EVERY DAY   atorvastatin (LIPITOR) 10 MG tablet TAKE 1 TABLET BY MOUTH EVERYDAY AT BEDTIME   benzonatate (TESSALON) 200 MG capsule Take 1 capsule (200 mg total) by mouth 3 (three) times daily.   budesonide (PULMICORT) 0.5 MG/2ML nebulizer solution Take 0.5 mg by nebulization as needed.   colchicine 0.6 MG tablet Take 0.6 mg by mouth daily as needed (for gout flare-ups).    Continuous Blood Gluc Receiver (FREESTYLE LIBRE 2 READER) DEVI 1 each by Does not apply route continuous.   Continuous Blood Gluc Sensor (FREESTYLE LIBRE 2 SENSOR) MISC USE AS DIRECTED   fluconazole (DIFLUCAN) 100 MG tablet Take 100 mg by mouth daily.   furosemide (LASIX) 40 MG tablet TAKE 1 TABLET BY MOUTH EVERY DAY   gabapentin (NEURONTIN) 800 MG tablet Take 1 tablet (800 mg total) by mouth 3 (three) times daily.   glimepiride (AMARYL) 4 MG tablet TAKE 1 TABLET (4 MG TOTAL) BY MOUTH 2 (TWO) TIMES DAILY.   JARDIANCE 25 MG TABS tablet TAKE 25 MG BY MOUTH DAILY.   levothyroxine (SYNTHROID) 88 MCG tablet TAKE 1 TABLET BY MOUTH EVERY DAY   metFORMIN (GLUCOPHAGE) 1000 MG tablet TAKE 1 TABLET BY MOUTH TWICE A DAY   mycophenolate (CELLCEPT) 250 MG capsule Take 250 mg by mouth every 12 (twelve) hours.   nystatin cream (MYCOSTATIN) Apply 1 application topically 2 (two)  times daily. (Patient taking differently: Apply 1 application topically 2 (two) times daily as needed (for diabetic yeast reactions.).)   ondansetron (ZOFRAN) 4 MG tablet TAKE 1 TABLET BY MOUTH EVERY 8 HOURS AS NEEDED FOR NAUSEA AND VOMITING   OZEMPIC, 0.25 OR 0.5 MG/DOSE, 2 MG/1.5ML SOPN INJECT 0.5MG  INTO THE SKIN ONCE A WEEK.   pantoprazole (PROTONIX) 20 MG tablet Take by mouth.   pioglitazone (ACTOS) 30 MG tablet TAKE 1 TABLET BY MOUTH EVERY DAY   sulfamethoxazole-trimethoprim (BACTRIM) 400-80 MG tablet Take 1 tablet by mouth 3 (three) times a week.   No facility-administered encounter medications on file as of 06/10/2021.    Care Gaps: Shingrix Vaccine Urine Microalbumin (Last Completed 06/06/2017) Tetanus Vaccine (last Completed 05/11/2011) COVID-19 (4- Booster for [employer] series) Star Rating Drug: Ozempic 0.5 last filled on 06/07/2021 for a 84-day supply with [facility] Jardiance 25 mg last filled on 04/09/2021 for a 90-Day supply with [facility] Metformin 1000 mg last filled on 05/28/2021 for a 90-Day supply with [facility] Glimepiride 4 mg last filled on 05/26/2021 for a 90-Day supply with [facility] Atorvastatin 4 mg last filled on 03/14/2021 for a 90-Day supply with [facility] Pioglitazone 30 mg last filled on 05/26/2021 for a 90-Day supply with [facility] Medication Fill Gaps: Furosemide (LASIX) 40 MG last filled 01/22/2021 90 day supply  Recent Relevant Labs: Lab Results  Component Value Date/Time   HGBA1C 8.2 (  H) 05/16/2021 09:40 AM   HGBA1C 7.3 (A) 01/17/2021 05:11 PM   HGBA1C 6.9 (A) 09/14/2020 11:09 AM   HGBA1C 7.4 (H) 09/18/2019 11:21 AM   HGBA1C 7.6 (H) 04/04/2013 10:47 AM   MICROALBUR 50 06/06/2017 11:26 AM   MICROALBUR 20 12/08/2015 10:39 AM    Kidney Function Lab Results  Component Value Date/Time   CREATININE 0.73 05/16/2021 09:40 AM   CREATININE 0.78 09/14/2020 01:03 PM   CREATININE 0.73 05/14/2014 09:56 AM   CREATININE 0.68 04/04/2013 10:47  AM   GFRNONAA 75 09/14/2020 01:03 PM   GFRNONAA >60 05/14/2014 09:56 AM   GFRNONAA >60 04/04/2013 10:47 AM   GFRAA 86 09/14/2020 01:03 PM   GFRAA >60 05/14/2014 09:56 AM   GFRAA >60 04/04/2013 10:47 AM    Current antihyperglycemic regimen:  Glimepiride 4 mg twice daily  Jardiance 25 mg daily  Metformin 1000 mg twice daily  Ozempic 0.5 mg weekly (Thursday)  Pioglitazone 30 mg daily   I have attempted without success to contact this patient by phone three times to do her Diabetes Disease State call. I left a Voice message for patient to return my call.   Adherence Review: Is the patient currently on a STATIN medication? Yes Is the patient currently on ACE/ARB medication? No Does the patient have >5 day gap between last estimated fill dates? No  Face to Face appointment with care management team member scheduled for: 07/13/2021 at 3:00 PM  [place] Pharmacist Assistant [phone]

## 2021-06-13 ENCOUNTER — Observation Stay: Payer: PPO

## 2021-06-13 ENCOUNTER — Emergency Department: Payer: PPO

## 2021-06-13 ENCOUNTER — Ambulatory Visit: Payer: Self-pay

## 2021-06-13 ENCOUNTER — Encounter: Payer: Self-pay | Admitting: Internal Medicine

## 2021-06-13 ENCOUNTER — Other Ambulatory Visit: Payer: Self-pay

## 2021-06-13 ENCOUNTER — Inpatient Hospital Stay
Admission: EM | Admit: 2021-06-13 | Discharge: 2021-06-20 | DRG: 312 | Disposition: A | Discharge status: Skilled Nursing Facility | Payer: PPO | Attending: Internal Medicine | Admitting: Internal Medicine

## 2021-06-13 DIAGNOSIS — G63 Polyneuropathy in diseases classified elsewhere: Secondary | ICD-10-CM

## 2021-06-13 DIAGNOSIS — E1165 Type 2 diabetes mellitus with hyperglycemia: Secondary | ICD-10-CM | POA: Diagnosis present

## 2021-06-13 DIAGNOSIS — Z7984 Long term (current) use of oral hypoglycemic drugs: Secondary | ICD-10-CM

## 2021-06-13 DIAGNOSIS — I951 Orthostatic hypotension: Secondary | ICD-10-CM | POA: Diagnosis present

## 2021-06-13 DIAGNOSIS — I440 Atrioventricular block, first degree: Secondary | ICD-10-CM | POA: Diagnosis present

## 2021-06-13 DIAGNOSIS — G43909 Migraine, unspecified, not intractable, without status migrainosus: Secondary | ICD-10-CM | POA: Diagnosis present

## 2021-06-13 DIAGNOSIS — E559 Vitamin D deficiency, unspecified: Secondary | ICD-10-CM | POA: Diagnosis present

## 2021-06-13 DIAGNOSIS — I110 Hypertensive heart disease with heart failure: Secondary | ICD-10-CM | POA: Diagnosis present

## 2021-06-13 DIAGNOSIS — F411 Generalized anxiety disorder: Secondary | ICD-10-CM | POA: Diagnosis present

## 2021-06-13 DIAGNOSIS — Z6837 Body mass index (BMI) 37.0-37.9, adult: Secondary | ICD-10-CM

## 2021-06-13 DIAGNOSIS — E876 Hypokalemia: Secondary | ICD-10-CM | POA: Diagnosis present

## 2021-06-13 DIAGNOSIS — G4733 Obstructive sleep apnea (adult) (pediatric): Secondary | ICD-10-CM | POA: Diagnosis present

## 2021-06-13 DIAGNOSIS — M349 Systemic sclerosis, unspecified: Secondary | ICD-10-CM | POA: Diagnosis present

## 2021-06-13 DIAGNOSIS — R002 Palpitations: Secondary | ICD-10-CM | POA: Diagnosis not present

## 2021-06-13 DIAGNOSIS — F419 Anxiety disorder, unspecified: Secondary | ICD-10-CM | POA: Diagnosis present

## 2021-06-13 DIAGNOSIS — Z7401 Bed confinement status: Secondary | ICD-10-CM | POA: Diagnosis not present

## 2021-06-13 DIAGNOSIS — E1140 Type 2 diabetes mellitus with diabetic neuropathy, unspecified: Secondary | ICD-10-CM

## 2021-06-13 DIAGNOSIS — Z79899 Other long term (current) drug therapy: Secondary | ICD-10-CM

## 2021-06-13 DIAGNOSIS — E785 Hyperlipidemia, unspecified: Secondary | ICD-10-CM | POA: Diagnosis present

## 2021-06-13 DIAGNOSIS — F32A Depression, unspecified: Secondary | ICD-10-CM | POA: Diagnosis present

## 2021-06-13 DIAGNOSIS — I4510 Unspecified right bundle-branch block: Secondary | ICD-10-CM | POA: Diagnosis present

## 2021-06-13 DIAGNOSIS — Z833 Family history of diabetes mellitus: Secondary | ICD-10-CM

## 2021-06-13 DIAGNOSIS — J849 Interstitial pulmonary disease, unspecified: Secondary | ICD-10-CM | POA: Diagnosis present

## 2021-06-13 DIAGNOSIS — Z91048 Other nonmedicinal substance allergy status: Secondary | ICD-10-CM

## 2021-06-13 DIAGNOSIS — Z20822 Contact with and (suspected) exposure to covid-19: Secondary | ICD-10-CM | POA: Diagnosis present

## 2021-06-13 DIAGNOSIS — M109 Gout, unspecified: Secondary | ICD-10-CM | POA: Diagnosis present

## 2021-06-13 DIAGNOSIS — I2510 Atherosclerotic heart disease of native coronary artery without angina pectoris: Secondary | ICD-10-CM | POA: Diagnosis present

## 2021-06-13 DIAGNOSIS — I10 Essential (primary) hypertension: Secondary | ICD-10-CM | POA: Diagnosis not present

## 2021-06-13 DIAGNOSIS — Z794 Long term (current) use of insulin: Secondary | ICD-10-CM

## 2021-06-13 DIAGNOSIS — R2689 Other abnormalities of gait and mobility: Secondary | ICD-10-CM | POA: Diagnosis not present

## 2021-06-13 DIAGNOSIS — E538 Deficiency of other specified B group vitamins: Secondary | ICD-10-CM | POA: Diagnosis present

## 2021-06-13 DIAGNOSIS — R42 Dizziness and giddiness: Secondary | ICD-10-CM | POA: Diagnosis not present

## 2021-06-13 DIAGNOSIS — Z888 Allergy status to other drugs, medicaments and biological substances status: Secondary | ICD-10-CM

## 2021-06-13 DIAGNOSIS — I6529 Occlusion and stenosis of unspecified carotid artery: Secondary | ICD-10-CM | POA: Diagnosis not present

## 2021-06-13 DIAGNOSIS — E669 Obesity, unspecified: Secondary | ICD-10-CM | POA: Diagnosis present

## 2021-06-13 DIAGNOSIS — R41841 Cognitive communication deficit: Secondary | ICD-10-CM | POA: Diagnosis not present

## 2021-06-13 DIAGNOSIS — G4730 Sleep apnea, unspecified: Secondary | ICD-10-CM | POA: Diagnosis present

## 2021-06-13 DIAGNOSIS — I5032 Chronic diastolic (congestive) heart failure: Secondary | ICD-10-CM | POA: Diagnosis present

## 2021-06-13 DIAGNOSIS — E039 Hypothyroidism, unspecified: Secondary | ICD-10-CM | POA: Diagnosis present

## 2021-06-13 DIAGNOSIS — R569 Unspecified convulsions: Secondary | ICD-10-CM | POA: Diagnosis not present

## 2021-06-13 DIAGNOSIS — Z885 Allergy status to narcotic agent status: Secondary | ICD-10-CM

## 2021-06-13 DIAGNOSIS — M6281 Muscle weakness (generalized): Secondary | ICD-10-CM | POA: Diagnosis not present

## 2021-06-13 DIAGNOSIS — G901 Familial dysautonomia [Riley-Day]: Secondary | ICD-10-CM

## 2021-06-13 DIAGNOSIS — K219 Gastro-esophageal reflux disease without esophagitis: Secondary | ICD-10-CM | POA: Diagnosis present

## 2021-06-13 DIAGNOSIS — Z79890 Hormone replacement therapy (postmenopausal): Secondary | ICD-10-CM

## 2021-06-13 DIAGNOSIS — U099 Post covid-19 condition, unspecified: Secondary | ICD-10-CM | POA: Diagnosis present

## 2021-06-13 DIAGNOSIS — G319 Degenerative disease of nervous system, unspecified: Secondary | ICD-10-CM | POA: Diagnosis not present

## 2021-06-13 DIAGNOSIS — E119 Type 2 diabetes mellitus without complications: Secondary | ICD-10-CM | POA: Diagnosis not present

## 2021-06-13 DIAGNOSIS — R55 Syncope and collapse: Secondary | ICD-10-CM | POA: Diagnosis present

## 2021-06-13 DIAGNOSIS — G4700 Insomnia, unspecified: Secondary | ICD-10-CM | POA: Diagnosis present

## 2021-06-13 DIAGNOSIS — I5020 Unspecified systolic (congestive) heart failure: Secondary | ICD-10-CM | POA: Diagnosis not present

## 2021-06-13 DIAGNOSIS — Z7951 Long term (current) use of inhaled steroids: Secondary | ICD-10-CM

## 2021-06-13 DIAGNOSIS — W19XXXA Unspecified fall, initial encounter: Secondary | ICD-10-CM | POA: Diagnosis not present

## 2021-06-13 DIAGNOSIS — Z8249 Family history of ischemic heart disease and other diseases of the circulatory system: Secondary | ICD-10-CM

## 2021-06-13 LAB — CBC
HCT: 47.6 % — ABNORMAL HIGH (ref 36.0–46.0)
HCT: 44.1 % (ref 36.0–46.0)
Hemoglobin: 15.6 g/dL — ABNORMAL HIGH (ref 12.0–15.0)
Hemoglobin: 14.3 g/dL (ref 12.0–15.0)
MCH: 30.1 pg (ref 26.0–34.0)
MCH: 29.9 pg (ref 26.0–34.0)
MCHC: 32.8 g/dL (ref 30.0–36.0)
MCHC: 32.4 g/dL (ref 30.0–36.0)
MCV: 91.9 fL (ref 80.0–100.0)
MCV: 92.1 fL (ref 80.0–100.0)
Platelets: 238 10*3/uL (ref 150–400)
Platelets: 207 10*3/uL (ref 150–400)
RBC: 5.18 MIL/uL — ABNORMAL HIGH (ref 3.87–5.11)
RBC: 4.79 MIL/uL (ref 3.87–5.11)
RDW: 14.3 % (ref 11.5–15.5)
RDW: 14.2 % (ref 11.5–15.5)
WBC: 9.7 10*3/uL (ref 4.0–10.5)
WBC: 7.8 10*3/uL (ref 4.0–10.5)
nRBC: 0 % (ref 0.0–0.2)
nRBC: 0 % (ref 0.0–0.2)

## 2021-06-13 LAB — URINALYSIS, COMPLETE (UACMP) WITH MICROSCOPIC
Bilirubin Urine: NEGATIVE
Glucose, UA: >=500 mg/dL — AB
Hgb urine dipstick: NEGATIVE
Ketones, ur: 5 mg/dL — AB
Nitrite: NEGATIVE
Protein, ur: NEGATIVE mg/dL
Specific Gravity, Urine: 1.026 (ref 1.005–1.030)
pH: 5 (ref 5.0–8.0)

## 2021-06-13 LAB — BASIC METABOLIC PANEL
Anion gap: 11 (ref 5–15)
BUN: 19 mg/dL (ref 8–23)
CO2: 28 mmol/L (ref 22–32)
Calcium: 9.2 mg/dL (ref 8.9–10.3)
Chloride: 100 mmol/L (ref 98–111)
Creatinine, Ser: 0.91 mg/dL (ref 0.44–1.00)
GFR, Estimated: >60 mL/min (ref >60)
Glucose, Bld: 146 mg/dL — ABNORMAL HIGH (ref 70–99)
Potassium: 3.4 mmol/L — ABNORMAL LOW (ref 3.5–5.1)
Sodium: 139 mmol/L (ref 135–145)

## 2021-06-13 LAB — TROPONIN I (HIGH SENSITIVITY)
Troponin I (High Sensitivity): 7 ng/L (ref <18)
Troponin I (High Sensitivity): 5 ng/L (ref <18)

## 2021-06-13 LAB — RESP PANEL BY RT-PCR (FLU A&B, COVID) ARPGX2
Influenza A by PCR: NEGATIVE
Influenza B by PCR: NEGATIVE
SARS Coronavirus 2 by RT PCR: NEGATIVE

## 2021-06-13 LAB — BRAIN NATRIURETIC PEPTIDE: B Natriuretic Peptide: 18.5 pg/mL (ref 0.0–100.0)

## 2021-06-13 LAB — CREATININE, SERUM
Creatinine, Ser: 0.6 mg/dL (ref 0.44–1.00)
GFR, Estimated: >60 mL/min (ref >60)

## 2021-06-13 LAB — CBG MONITORING, ED: Glucose-Capillary: 91 mg/dL (ref 70–99)

## 2021-06-13 MED ORDER — INSULIN ASPART 100 UNIT/ML IJ SOLN
0.0000 [IU] | Freq: Every day | INTRAMUSCULAR | Status: DC
  Administered 2021-06-15: 22:00:00 4 [IU] via SUBCUTANEOUS
  Administered 2021-06-17: 22:00:00 2 [IU] via SUBCUTANEOUS
  Administered 2021-06-18: 22:00:00 4 [IU] via SUBCUTANEOUS
  Filled 2021-06-13 (×3): qty 1

## 2021-06-13 MED ORDER — INSULIN ASPART 100 UNIT/ML IJ SOLN
0.0000 [IU] | Freq: Three times a day (TID) | INTRAMUSCULAR | Status: DC
  Administered 2021-06-14: 12:00:00 3 [IU] via SUBCUTANEOUS
  Administered 2021-06-14 (×2): 2 [IU] via SUBCUTANEOUS
  Administered 2021-06-15 (×3): 3 [IU] via SUBCUTANEOUS
  Administered 2021-06-16: 13:00:00 5 [IU] via SUBCUTANEOUS
  Administered 2021-06-16 (×2): 3 [IU] via SUBCUTANEOUS
  Administered 2021-06-17: 15 [IU] via SUBCUTANEOUS
  Administered 2021-06-17 – 2021-06-18 (×2): 5 [IU] via SUBCUTANEOUS
  Administered 2021-06-18 (×2): 3 [IU] via SUBCUTANEOUS
  Administered 2021-06-19: 5 [IU] via SUBCUTANEOUS
  Administered 2021-06-19 (×2): 2 [IU] via SUBCUTANEOUS
  Administered 2021-06-20: 3 [IU] via SUBCUTANEOUS
  Administered 2021-06-20: 09:00:00 2 [IU] via SUBCUTANEOUS
  Filled 2021-06-13 (×19): qty 1

## 2021-06-13 MED ORDER — ONDANSETRON HCL 4 MG PO TABS: 4.0000 mg | ORAL_TABLET | Freq: Four times a day (QID) | ORAL | Status: DC | PRN

## 2021-06-13 MED ORDER — ACETAMINOPHEN 650 MG RE SUPP: 650.0000 mg | Freq: Four times a day (QID) | RECTAL | Status: DC | PRN

## 2021-06-13 MED ORDER — MECLIZINE HCL 25 MG PO TABS
12.5000 mg | ORAL_TABLET | Freq: Once | ORAL | Status: AC
  Administered 2021-06-13: 12.5 mg via ORAL
  Filled 2021-06-13: qty 1

## 2021-06-13 MED ORDER — SODIUM CHLORIDE 0.9 % IV BOLUS
500.0000 mL | Freq: Once | INTRAVENOUS | Status: AC
  Administered 2021-06-13: 500 mL via INTRAVENOUS

## 2021-06-13 MED ORDER — SODIUM CHLORIDE 0.9% FLUSH
3.0000 mL | Freq: Two times a day (BID) | INTRAVENOUS | Status: DC
  Administered 2021-06-13 – 2021-06-20 (×12): 3 mL via INTRAVENOUS

## 2021-06-13 MED ORDER — ACETAMINOPHEN 325 MG PO TABS
650.0000 mg | ORAL_TABLET | Freq: Four times a day (QID) | ORAL | Status: DC | PRN
  Administered 2021-06-13 – 2021-06-20 (×13): 650 mg via ORAL
  Filled 2021-06-13 (×13): qty 2

## 2021-06-13 MED ORDER — ENOXAPARIN SODIUM 60 MG/0.6ML IJ SOSY
0.5000 mg/kg | PREFILLED_SYRINGE | INTRAMUSCULAR | Status: DC
  Administered 2021-06-13 – 2021-06-17 (×5): 45 mg via SUBCUTANEOUS
  Filled 2021-06-13 (×5): qty 0.6

## 2021-06-13 MED ORDER — ONDANSETRON HCL 4 MG/2ML IJ SOLN: 4.0000 mg | Freq: Four times a day (QID) | INTRAMUSCULAR | Status: DC | PRN

## 2021-06-13 NOTE — ED Provider Notes (Signed)
Emergency Medicine Provider Triage Evaluation Note  Teresa [name] , a 75 y.o. female  was evaluated in triage.  Pt complains of complaints of multiple syncopal episodes.  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  Does not have a pacemaker..  Review of Systems  Positive: Syncope Negative: No chest pain, shortness of breath, head injury  Physical Exam  BP 124/66 (BP Location: Left Arm)   Pulse 73   Temp 98.2 F (36.8 C) (Oral)   Resp 17   SpO2 95%  Gen:   Awake, no distress   Resp:  Normal effort  MSK:   Moves extremities without difficulty  Other:    Medical Decision Making  Medically screening exam initiated at 2:42 PM.  Appropriate orders placed.  Teresa [name] was informed that the remainder of the evaluation will be completed by another provider, this initial triage assessment does not replace that evaluation, and the importance of remaining in the ED until their evaluation is complete.  Patient is stable at this time   Weldon Inches 06/13/21 1443    Blake Divine, MD 06/13/21 1535

## 2021-06-13 NOTE — ED Notes (Signed)
This RN verified that lab could run Trop and BNP on blood already in lab at this time.

## 2021-06-13 NOTE — Telephone Encounter (Signed)
Pt c/o fainting everyday for the last 2 weeks. She stated she feels shaky and weak and starts breathing fast. Pt stated she has a h/o fainting.  During call pt was sitting because she was dizzy. Pt stated she is dizzy al the time. Pt stated that she has moderate headache everyday. Care advice given and pt verbalized understanding. Pt will be driven to the ED.   Reason for Disposition  Patient sounds very sick or weak to the triager  Answer Assessment - Initial Assessment Questions 1. ONSET: "How long were you unconscious?" (minutes) "When did it happen?"     unknown 2. CONTENT: "What happened during period of unconsciousness?" (e.g., seizure activity)      no 3. MENTAL STATUS: "Alert and oriented now?" (oriented x 3 = name, month, location)      yes 4. TRIGGER: "What do you think caused the fainting?" "What were you doing just before you fainted?"  (e.g., exercise, sudden standing up, prolonged standing)     No-feels dizzyall the time past week 5. RECURRENT SYMPTOM: "Have you ever passed out before?" If Yes, ask: "When was the last time?" and "What happened that time?"      Yes-dizzy and sat down  6. INJURY: "Did you sustain any injury during the fall?"      no 7. CARDIAC SYMPTOMS: "Have you had any of the following symptoms: chest pain, difficulty breathing, palpitations?"     SOB secondary form Covid 8. NEUROLOGIC SYMPTOMS: "Have you had any of the following symptoms: headache, numbness, vertigo, weakness?"     Dizziness, headache every day  (moderate)OTC helps 9. GI SYMPTOMS: "Have you had any of the following symptoms: abdominal pain, vomiting, diarrhea, blood in stools?"     no 10. OTHER SYMPTOMS: "Do you have any other symptoms?"       no 11. PREGNANCY: "Is there any chance you are pregnant?" "When was your last menstrual period?"       N/a  Protocols used: Fainting-A-AH

## 2021-06-13 NOTE — ED Notes (Signed)
Assisted patient to the bathroom. Urine sample collected, labeled, and sent to lab

## 2021-06-13 NOTE — ED Provider Notes (Signed)
[facility] Emergency Department Provider Note    Event Date/Time   First MD Initiated Contact with Patient 06/13/21 1714     (approximate)  I have reviewed the triage vital signs and the nursing notes.   HISTORY  Chief Complaint Near Syncope    HPI Teresa [name] is a 75 y.o. female below listed past medical history presents to the ER for 2 weeks of frequent syncopal episodes.  Feels lightheaded and dizzy prior to the episode.  Uncertain as to how long she is out.  Will occasionally feel palpitations is having some chest pain and pressure and some shortness of breath also having dysuria.  Feels like the room is spinning.  No significant postural change.  No recent fevers no nausea or vomiting.  Denies any new numbness or tingling.  Past Medical History:  Diagnosis Date   Complication of anesthesia    Depression    Diabetes mellitus without complication (HCC)    Dyspnea    DOE   Edema    FEET/LEGS   GERD (gastroesophageal reflux disease)    Gout    Headache    Heart murmur    History of hiatal hernia    History of orthopnea    Hypertension    Hypothyroidism    PONV (postoperative nausea and vomiting)    Scleroderma (HCC)    hands   Sleep apnea    NO CPAP   Thyroid disease    Vertigo    Family History  Problem Relation Age of Onset   Stroke Mother    Hypertension Mother    Heart disease Mother    Arthritis Mother    Heart disease Father    Hypertension Father    Diabetes Sister    Hypertension Sister    Asthma Sister    Hypertension Sister    Diverticulitis Sister    Colon cancer Maternal Grandmother    Breast cancer Maternal Grandmother    Breast cancer Maternal Aunt    Past Surgical History:  Procedure Laterality Date   ABDOMINAL HYSTERECTOMY     ovaries intact   BLADDER SURGERY     bladder tuck   BREAST BIOPSY Right 03/20/2016   neg x 2 area   CARDIAC CATHETERIZATION     CARPAL TUNNEL RELEASE     CATARACT EXTRACTION  W/PHACO Left 09/17/2018   Procedure: CATARACT EXTRACTION PHACO AND INTRAOCULAR LENS PLACEMENT ([place]) LEFT, DIABETIC;  Surgeon: Birder Robson, MD;  Location: [facility] ORS;  Service: Ophthalmology;  Laterality: Left;  [place] 00:34 CDE 4.85 Fluid pack lot # [id number]   CATARACT EXTRACTION W/PHACO Right 10/15/2018   Procedure: CATARACT EXTRACTION PHACO AND INTRAOCULAR LENS PLACEMENT (I[facility])-RIGHT;  Surgeon: Birder Robson, MD;  Location: [facility] ORS;  Service: Ophthalmology;  Laterality: Right;  [place] 00:27.6 CDE 3.43 Fluid Pack Lot # [id number]   COLONOSCOPY WITH PROPOFOL N/A 09/27/2018   Procedure: COLONOSCOPY WITH PROPOFOL;  Surgeon: Virgel Manifold, MD;  Location: [facility] ENDOSCOPY;  Service: Endoscopy;  Laterality: N/A;   DILATION AND CURETTAGE OF UTERUS     EYE SURGERY     eyelid   FRACTURE SURGERY     left ankle-plate and screws palced   LITHOTRIPSY     NISSEN FUNDOPLICATION     SHOULDER SURGERY Right    TONSILLECTOMY     Patient Active Problem List   Diagnosis Date Noted   Oxygen desaturation 06/24/2019   Encounter for examination following treatment at hospital 06/24/2019   History of 2019  novel coronavirus disease (COVID-19) 06/24/2019   Pneumonia due to COVID-19 virus 05/16/2019   Intractable nausea and vomiting 05/16/2019   Chronic diastolic CHF (congestive heart failure) ([place]) 05/16/2019   Encounter for screening colonoscopy    Benign neoplasm of cecum    Diverticulosis of large intestine without diverticulitis    Tortuous colon    Polyp of sigmoid colon    Intestinal lump    Polyneuropathy associated with underlying disease ([place]) 12/11/2016   Microcalcifications of the breast 12/15/2015   Back pain, chronic 06/28/2015   Chronic airway obstruction ([place]) 06/28/2015   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   Essential hypertension 06/21/2009   CAD (coronary artery disease) 06/21/2009   FEVER UNSPECIFIED 06/21/2009   NAUSEA 06/21/2009   DYSPHAGIA 06/21/2009   RUQ PAIN 06/21/2009    HIATAL HERNIA 01/07/2008   Hypothyroidism 11/22/2007   ANXIETY 11/22/2007   DEPRESSION 11/22/2007   Hypertensive heart disease without heart failure 11/22/2007   ANGINA PECTORIS 11/22/2007   RENAL CALCULUS 11/22/2007   Osteoarthritis 11/22/2007   SLEEP APNEA 11/22/2007   HEADACHE, CHRONIC 11/22/2007   GASTRITIS 10/23/2007   DIVERTICULOSIS, COLON 10/23/2007   Diabetes mellitus type II, non insulin dependent ([place]) 07/16/2007   HYPERCHOLESTEROLEMIA 07/16/2007   OBESITY, MODERATE 07/16/2007   ISCHEMIC HEART DISEASE 07/16/2007   ESOPHAGEAL REFLUX 07/16/2007   SCLERODERMA 07/16/2007   DYSPNEA 07/16/2007   COUGH, CHRONIC 07/16/2007      Prior to Admission medications   Medication Sig Start Date End Date Taking? Authorizing Provider  acetaminophen (TYLENOL) 500 MG tablet Take 500 mg by mouth every 6 (six) hours as needed for mild pain or headache.    [provider]  albuterol (VENTOLIN HFA) 108 (90 Base) MCG/ACT inhaler SMARTSIG:2 Inhalation Via Inhaler Every 6 Hours PRN 03/26/21   [provider]  allopurinol (ZYLOPRIM) 100 MG tablet TAKE 1 TABLET BY MOUTH EVERY DAY 04/26/21   [name], MD  atorvastatin (LIPITOR) 10 MG tablet TAKE 1 TABLET BY MOUTH EVERYDAY AT BEDTIME 01/06/21   [name], MD  benzonatate (TESSALON) 200 MG capsule Take 1 capsule (200 mg total) by mouth 3 (three) times daily. 05/27/19   Patrecia Pour, MD  budesonide (PULMICORT) 0.5 MG/2ML nebulizer solution Take 0.5 mg by nebulization as needed. 10/09/19 10/08/20  [provider]  colchicine 0.6 MG tablet Take 0.6 mg by mouth daily as needed (for gout flare-ups).     [provider]  Continuous Blood Gluc Receiver (FREESTYLE LIBRE 2 READER) DEVI 1 each by Does not apply route continuous. 07/16/20   [name], MD  Continuous Blood Gluc Sensor (FREESTYLE LIBRE 2 SENSOR) MISC USE AS DIRECTED 05/16/21   [name], MD  fluconazole (DIFLUCAN) 100  MG tablet Take 100 mg by mouth daily. 03/22/21   [provider]  furosemide (LASIX) 40 MG tablet TAKE 1 TABLET BY MOUTH EVERY DAY 02/02/21   [name], MD  gabapentin (NEURONTIN) 800 MG tablet Take 1 tablet (800 mg total) by mouth 3 (three) times daily. 05/16/21   [name], MD  glimepiride (AMARYL) 4 MG tablet TAKE 1 TABLET (4 MG TOTAL) BY MOUTH 2 (TWO) TIMES DAILY. 12/21/20   [name], MD  JARDIANCE 25 MG TABS tablet TAKE 25 MG BY MOUTH DAILY. 02/10/21   [name], MD  levothyroxine (SYNTHROID) 88 MCG tablet TAKE 1 TABLET BY MOUTH EVERY DAY 10/29/20   [name],  MD  metFORMIN (GLUCOPHAGE) 1000 MG tablet TAKE 1 TABLET BY MOUTH TWICE A DAY 03/30/21   [name], MD  mycophenolate (CELLCEPT) 250 MG capsule Take 250 mg by mouth every 12 (twelve) hours. 03/22/21   [provider]  nystatin cream (MYCOSTATIN) Apply 1 application topically 2 (two) times daily. Patient taking differently: Apply 1 application topically 2 (two) times daily as needed (for diabetic yeast reactions.). 10/04/17   [name], MD  ondansetron (ZOFRAN) 4 MG tablet TAKE 1 TABLET BY MOUTH EVERY 8 HOURS AS NEEDED FOR NAUSEA AND VOMITING 08/21/19   [name], MD  OZEMPIC, 0.25 OR 0.5 MG/DOSE, 2 MG/1.5ML SOPN INJECT 0.5MG  INTO THE SKIN ONCE A WEEK. 03/09/21   [name], MD  pantoprazole (PROTONIX) 20 MG tablet Take by mouth.    [provider]  pioglitazone (ACTOS) 30 MG tablet TAKE 1 TABLET BY MOUTH EVERY DAY 12/21/20   [name], MD  sulfamethoxazole-trimethoprim (BACTRIM) 400-80 MG tablet Take 1 tablet by mouth 3 (three) times a week. 03/22/21   [provider]    Allergies Bacitracin-neomycin-polymyxin, Clarithromycin, Codeine, Dilaudid  [hydromorphone hcl], Iodine, Iohexol, Neomycin-bacitracin zn-polymyx, Tamiflu  [oseltamivir phosphate], Zolpidem, Benzalkonium chloride, Lidocaine  hcl, Morphine, Tape, and Tapentadol    Social History Social History   Tobacco Use   Smoking status: Never   Smokeless tobacco: Never  Vaping Use   Vaping Use: Never used  Substance Use Topics   Alcohol use: No    Alcohol/week: 0.0 standard drinks   Drug use: Never    Review of Systems Patient denies headaches, rhinorrhea, blurry vision, numbness, shortness of breath, chest pain, edema, cough, abdominal pain, nausea, vomiting, diarrhea, dysuria, fevers, rashes or hallucinations unless otherwise stated above in HPI. ____________________________________________   PHYSICAL EXAM:  VITAL SIGNS: Vitals:   06/13/21 1436  BP: 124/66  Pulse: 73  Resp: 17  Temp: 98.2 F (36.8 C)  SpO2: 95%    Constitutional: Alert and oriented.  Eyes: Conjunctivae are normal.  Head: Atraumatic. Nose: No congestion/rhinnorhea. Mouth/Throat: Mucous membranes are moist.   Neck: No stridor. Painless ROM.  Cardiovascular: Normal rate, regular rhythm. Grossly normal heart sounds.  Good peripheral circulation. Respiratory: Normal respiratory effort.  No retractions. Lungs CTAB. Gastrointestinal: Soft and nontender. No distention. No abdominal bruits. No CVA tenderness. Genitourinary:  Musculoskeletal: No lower extremity tenderness nor edema.  No joint effusions. Neurologic: CN- intact.  No facial droop, Normal FNF.  Normal heel to shin.  Sensation intact bilaterally. Normal speech and language. No gross focal neurologic deficits are appreciated. No gait instability. Skin:  Skin is warm, dry and intact. No rash noted. Psychiatric: Mood and affect are normal. Speech and behavior are normal.  ____________________________________________   LABS (all labs ordered are listed, but only abnormal results are displayed)  Results for orders placed or performed during the hospital encounter of 06/13/21 (from the past 24 hour(s))  Basic metabolic panel     Status: Abnormal   Collection Time: 06/13/21   2:40 PM  Result Value Ref Range   Sodium 139 135 - 145 mmol/L   Potassium 3.4 (L) 3.5 - 5.1 mmol/L   Chloride 100 98 - 111 mmol/L   CO2 28 22 - 32 mmol/L   Glucose, Bld 146 (H) 70 - 99 mg/dL   BUN 19 8 - 23 mg/dL   Creatinine, Ser 0.91 0.44 - 1.00 mg/dL   Calcium 9.2 8.9 - 10.3 mg/dL   GFR,  Estimated >60 >60 mL/min   Anion gap 11 5 - 15  CBC     Status: Abnormal   Collection Time: 06/13/21  2:40 PM  Result Value Ref Range   WBC 9.7 4.0 - 10.5 K/uL   RBC 5.18 (H) 3.87 - 5.11 MIL/uL   Hemoglobin 15.6 (H) 12.0 - 15.0 g/dL   HCT 47.6 (H) 36.0 - 46.0 %   MCV 91.9 80.0 - 100.0 fL   MCH 30.1 26.0 - 34.0 pg   MCHC 32.8 30.0 - 36.0 g/dL   RDW 14.3 11.5 - 15.5 %   Platelets 238 150 - 400 K/uL   nRBC 0.0 0.0 - 0.2 %   ____________________________________________  EKG My review and personal interpretation at Time: 14:48   Indication: syncope  Rate: 75  Rhythm: sinus Axis: normal Other: normal intervals, no stemi ____________________________________________  RADIOLOGY  I personally reviewed all radiographic images ordered to evaluate for the above acute complaints and reviewed radiology reports and findings.  These findings were personally discussed with the patient.  Please see medical record for radiology report.  ____________________________________________   PROCEDURES  Procedure(s) performed:  Procedures    Critical Care performed: no ____________________________________________   INITIAL IMPRESSION / ASSESSMENT AND PLAN / ED COURSE  Pertinent labs & imaging results that were available during my care of the patient were reviewed by me and considered in my medical decision making (see chart for details).   DDX: Dysrhythmia, CHF, ACS, CVA, TIA, seizure, electrolyte abnormality, anemia  Teresa [name] is a 75 y.o. who presents to the ED with presentation as described above with multiple syncopal or near syncopal events over the past 2 weeks.  No fall or hit her head.   No sign of subdural she is reporting persistent intermittent dizziness no focal neurodeficits on my exam we will order MRI.  EKG with some nonspecific changes she is not complaining of any shortness of breath no hypoxia no tachycardia.  Have a lower suspicion for PE.  Troponin is negative.  She is not anemic.  Is complaining of some dysuria with patient her presentation to improve admission for syncopal work-up as indicated.     The patient was evaluated in Emergency Department today for the symptoms described in the history of present illness. He/she was evaluated in the context of the global COVID-19 pandemic, which necessitated consideration that the patient might be at risk for infection with the SARS-CoV-2 virus that causes COVID-19. Institutional protocols and algorithms that pertain to the evaluation of patients at risk for COVID-19 are in a state of rapid change based on information released by regulatory bodies including the CDC and federal and state organizations. These policies and algorithms were followed during the patient's care in the ED.  As part of my medical decision making, I reviewed the following data within the [facility] notes reviewed and incorporated, Labs reviewed, notes from prior ED visits and [place] Controlled Substance Database   ____________________________________________   FINAL CLINICAL IMPRESSION(S) / ED DIAGNOSES  Final diagnoses:  Syncope and collapse  Dizziness      NEW MEDICATIONS STARTED DURING THIS VISIT:  New Prescriptions   No medications on file     Note:  This document was prepared using Dragon voice recognition software and may include unintentional dictation errors.    Merlyn Lot, MD 06/13/21 Pauline Aus

## 2021-06-13 NOTE — H&P (Signed)
History and Physical   Teresa [name]:[id number] DOB: 05/14/1946 DOA: 06/13/2021  Referring MD/NP/PA: Dr. Florentina Addison  PCP: [name], MD   Outpatient Specialists: Dr. Lanney Gins, pulmonary  Patient coming from: Home  Chief Complaint: Passing out  HPI: Teresa [name] is a 75 y.o. female with medical history significant of coronary artery disease, depression, GERD, nonspecific heart murmur, hypothyroidism, essential hypertension, scleroderma, obstructive sleep apnea, who has been having recurrent syncopal episodes she had 2 episodes on Saturday 2 episodes on Sunday and one episode today.  It usually happens suddenly with no dizziness.  No prior episodes.  No fever or chills.  She is a diabetic with known coronary artery disease.  No recent change in her medications.  Denied any prodrome.  Patient is being admitted for syncopal work-up..  ED Course: Temperature 98.6 blood pressure 140/118, pulse 92, respirate 20 and oxygen sat 95% room air.  Sodium 139 potassium 3.4 chloride 100 CO2 28 glucose 146.  BUN 19 creatinine 0.99 calcium 9.2.  CBC all within normal.  Urinalysis negative.  Influenza and COVID-19 negative.  Head CT without contrast is negative.  MRI of the brain ordered.  Patient being admitted for further evaluation and treatment.  Review of Systems: As per HPI otherwise 10 point review of systems negative.    Past Medical History:  Diagnosis Date   Complication of anesthesia    Depression    Diabetes mellitus without complication (HCC)    Dyspnea    DOE   Edema    FEET/LEGS   GERD (gastroesophageal reflux disease)    Gout    Headache    Heart murmur    History of hiatal hernia    History of orthopnea    Hypertension    Hypothyroidism    PONV (postoperative nausea and vomiting)    Scleroderma (HCC)    hands   Sleep apnea    NO CPAP   Thyroid disease    Vertigo     Past Surgical History:  Procedure Laterality Date   ABDOMINAL HYSTERECTOMY     ovaries  intact   BLADDER SURGERY     bladder tuck   BREAST BIOPSY Right 03/20/2016   neg x 2 area   CARDIAC CATHETERIZATION     CARPAL TUNNEL RELEASE     CATARACT EXTRACTION W/PHACO Left 09/17/2018   Procedure: CATARACT EXTRACTION PHACO AND INTRAOCULAR LENS PLACEMENT ([place]) LEFT, DIABETIC;  Surgeon: Birder Robson, MD;  Location: [facility];  Service: Ophthalmology;  Laterality: Left;  [place] 00:34 CDE 4.85 Fluid pack lot # [id number]   CATARACT EXTRACTION W/PHACO Right 10/15/2018   Procedure: CATARACT EXTRACTION PHACO AND INTRAOCULAR LENS PLACEMENT ([facility])-RIGHT;  Surgeon: Birder Robson, MD;  Location: [facility];  Service: Ophthalmology;  Laterality: Right;  [place] 00:27.6 CDE 3.43 Fluid Pack Lot # [id number]   COLONOSCOPY WITH PROPOFOL N/A 09/27/2018   Procedure: COLONOSCOPY WITH PROPOFOL;  Surgeon: Virgel Manifold, MD;  Location: [facility];  Service: Endoscopy;  Laterality: N/A;   DILATION AND CURETTAGE OF UTERUS     EYE SURGERY     eyelid   FRACTURE SURGERY     left ankle-plate and screws palced   LITHOTRIPSY     NISSEN FUNDOPLICATION     SHOULDER SURGERY Right    TONSILLECTOMY       reports that she has never smoked. She has never used smokeless tobacco. She reports that she does not drink alcohol and does not use drugs.  Allergies  Allergen Reactions  Bacitracin-Neomycin-Polymyxin    Clarithromycin Other (See Comments), Nausea Only and Nausea And Vomiting   Codeine     Other reaction(s): Hallucination   Dilaudid  [Hydromorphone Hcl] Nausea And Vomiting   Iodine    Iohexol      Desc: PT HAS HIVES/ITCHING    Neomycin-Bacitracin Zn-Polymyx    Tamiflu  [Oseltamivir Phosphate]     Other reaction(s): Abdominal Pain, Vomiting   Zolpidem Nausea And Vomiting and Other (See Comments)    Hallucinations   Benzalkonium Chloride Itching, Rash and Swelling   Lidocaine Hcl Itching, Rash and Swelling   Morphine Nausea Only, Rash and Nausea And Vomiting   Tape Itching and Rash    Adhesive  tape - silicone   Tapentadol Rash    Family History  Problem Relation Age of Onset   Stroke Mother    Hypertension Mother    Heart disease Mother    Arthritis Mother    Heart disease Father    Hypertension Father    Diabetes Sister    Hypertension Sister    Asthma Sister    Hypertension Sister    Diverticulitis Sister    Colon cancer Maternal Grandmother    Breast cancer Maternal Grandmother    Breast cancer Maternal Aunt      Prior to Admission medications   Medication Sig Start Date End Date Taking? Authorizing Provider  acetaminophen (TYLENOL) 500 MG tablet Take 500 mg by mouth every 6 (six) hours as needed for mild pain or headache.    [provider]  albuterol (VENTOLIN HFA) 108 (90 Base) MCG/ACT inhaler SMARTSIG:2 Inhalation Via Inhaler Every 6 Hours PRN 03/26/21   [provider]  allopurinol (ZYLOPRIM) 100 MG tablet TAKE 1 TABLET BY MOUTH EVERY DAY 04/26/21   [name], MD  atorvastatin (LIPITOR) 10 MG tablet TAKE 1 TABLET BY MOUTH EVERYDAY AT BEDTIME 01/06/21   [name], MD  benzonatate (TESSALON) 200 MG capsule Take 1 capsule (200 mg total) by mouth 3 (three) times daily. 05/27/19   Patrecia Pour, MD  budesonide (PULMICORT) 0.5 MG/2ML nebulizer solution Take 0.5 mg by nebulization as needed. 10/09/19 10/08/20  [provider]  colchicine 0.6 MG tablet Take 0.6 mg by mouth daily as needed (for gout flare-ups).     [provider]  Continuous Blood Gluc Receiver (FREESTYLE LIBRE 2 READER) DEVI 1 each by Does not apply route continuous. 07/16/20   [name], MD  Continuous Blood Gluc Sensor (FREESTYLE LIBRE 2 SENSOR) MISC USE AS DIRECTED 05/16/21   [name], MD  fluconazole (DIFLUCAN) 100 MG tablet Take 100 mg by mouth daily. 03/22/21   [provider]  furosemide (LASIX) 40 MG tablet TAKE 1 TABLET BY MOUTH EVERY DAY 02/02/21   [name], MD  gabapentin (NEURONTIN) 800 MG  tablet Take 1 tablet (800 mg total) by mouth 3 (three) times daily. 05/16/21   [name], MD  glimepiride (AMARYL) 4 MG tablet TAKE 1 TABLET (4 MG TOTAL) BY MOUTH 2 (TWO) TIMES DAILY. 12/21/20   [name], MD  JARDIANCE 25 MG TABS tablet TAKE 25 MG BY MOUTH DAILY. 02/10/21   [name], MD  levothyroxine (SYNTHROID) 88 MCG tablet TAKE 1 TABLET BY MOUTH EVERY DAY 10/29/20   [name], MD  metFORMIN (GLUCOPHAGE) 1000 MG tablet TAKE 1 TABLET BY MOUTH TWICE A DAY 03/30/21   [name], MD  mycophenolate (CELLCEPT) 250 MG capsule Take 250 mg by mouth every 12 (twelve) hours. 03/22/21   [provider]  nystatin cream (MYCOSTATIN) Apply 1 application topically 2 (two) times daily. Patient taking differently: Apply 1 application topically 2 (two) times daily as needed (for diabetic yeast reactions.). 10/04/17   [name], MD  ondansetron (ZOFRAN) 4 MG tablet TAKE 1 TABLET BY MOUTH EVERY 8 HOURS AS NEEDED FOR NAUSEA AND VOMITING 08/21/19   [name], MD  OZEMPIC, 0.25 OR 0.5 MG/DOSE, 2 MG/1.5ML SOPN INJECT 0.5MG  INTO THE SKIN ONCE A WEEK. 03/09/21   [name], MD  pantoprazole (PROTONIX) 20 MG tablet Take by mouth.    [provider]  pioglitazone (ACTOS) 30 MG tablet TAKE 1 TABLET BY MOUTH EVERY DAY 12/21/20   [name], MD  sulfamethoxazole-trimethoprim (BACTRIM) 400-80 MG tablet Take 1 tablet by mouth 3 (three) times a week. 03/22/21   [provider]    Physical Exam: Vitals:   06/13/21 1436 06/13/21 1443 06/13/21 1800 06/13/21 1830  BP: 124/66  140/78 (!) 140/118  Pulse: 73  73 67  Resp: 17  12 10   Temp: 98.2 F (36.8 C)     TempSrc: Oral     SpO2: 95%  97% 100%  Weight:  88.5 kg        Constitutional: Acutely ill looking no distress Vitals:   06/13/21 1436 06/13/21 1443 06/13/21 1800 06/13/21 1830  BP: 124/66  140/78 (!) 140/118  Pulse: 73  73 67  Resp: 17   12 10   Temp: 98.2 F (36.8 C)     TempSrc: Oral     SpO2: 95%  97% 100%  Weight:  88.5 kg     Eyes: PERRL, lids and conjunctivae normal ENMT: Mucous membranes are dry. Posterior pharynx clear of any exudate or lesions.Normal dentition.  Neck: normal, supple, no masses, no thyromegaly Respiratory: clear to auscultation bilaterally, no wheezing, no crackles. Normal respiratory effort. No accessory muscle use.  Cardiovascular: Regular rate and rhythm, no murmurs / rubs / gallops. No extremity edema. 2+ pedal pulses. No carotid bruits.  Abdomen: no tenderness, no masses palpated. No hepatosplenomegaly. Bowel sounds positive.  Musculoskeletal: no clubbing / cyanosis. No joint deformity upper and lower extremities. Good ROM, no contractures. Normal muscle tone.  Skin: no rashes, lesions, ulcers. No induration Neurologic: CN 2-12 grossly intact. Sensation intact, DTR normal. Strength 5/5 in all 4.  Psychiatric: Normal judgment and insight. Alert and oriented x 3. Normal mood.     Labs on Admission: I have personally reviewed following labs and imaging studies  CBC: Recent Labs  Lab 06/13/21 1440  WBC 9.7  HGB 15.6*  HCT 47.6*  MCV 91.9  PLT 301   Basic Metabolic Panel: Recent Labs  Lab 06/13/21 1440  NA 139  K 3.4*  CL 100  CO2 28  GLUCOSE 146*  BUN 19  CREATININE 0.91  CALCIUM 9.2   GFR: Estimated Creatinine Clearance: 55.2 mL/min (by C-G formula based on SCr of 0.91 mg/dL). Liver Function Tests: No results for input(s): AST, ALT, ALKPHOS, BILITOT, PROT, ALBUMIN in the last 168 hours. No results for input(s): LIPASE, AMYLASE in the last 168 hours. No results for input(s): AMMONIA in the last 168 hours. Coagulation Profile: No results for input(s): INR, PROTIME in the last 168 hours. Cardiac Enzymes: No results for input(s): CKTOTAL, CKMB, CKMBINDEX, TROPONINI in the last 168 hours. BNP (last 3 results) No results for  input(s): PROBNP in the last 8760  hours. HbA1C: No results for input(s): HGBA1C in the last 72 hours. CBG: No results for input(s): GLUCAP in the last 168 hours. Lipid Profile: No results for input(s): CHOL, HDL, LDLCALC, TRIG, CHOLHDL, LDLDIRECT in the last 72 hours. Thyroid Function Tests: No results for input(s): TSH, T4TOTAL, FREET4, T3FREE, THYROIDAB in the last 72 hours. Anemia Panel: No results for input(s): VITAMINB12, FOLATE, FERRITIN, TIBC, IRON, RETICCTPCT in the last 72 hours. Urine analysis:    Component Value Date/Time   COLORURINE Yellow 05/14/2014 0956   APPEARANCEUR Hazy 05/14/2014 0956   LABSPEC 1.028 05/14/2014 0956   PHURINE 5.0 05/14/2014 0956   GLUCOSEU Negative 05/14/2014 0956   HGBUR Negative 05/14/2014 0956   BILIRUBINUR Negative 12/08/2015 0959   BILIRUBINUR Negative 05/14/2014 0956   KETONESUR Trace 05/14/2014 0956   PROTEINUR Negative 12/08/2015 0959   PROTEINUR Negative 05/14/2014 0956   UROBILINOGEN 0.2 12/08/2015 0959   NITRITE Negative 12/08/2015 0959   NITRITE Negative 05/14/2014 0956   LEUKOCYTESUR Negative 12/08/2015 0959   LEUKOCYTESUR 3+ 05/14/2014 0956   Sepsis Labs: @LABRCNTIP (procalcitonin:4,lacticidven:4) )No results found for this or any previous visit (from the past 240 hour(s)).   Radiological Exams on Admission: CT HEAD WO CONTRAST  Result Date: 06/13/2021 CLINICAL DATA:  Dizziness.  Multiple syncopal episodes. EXAM: CT HEAD WITHOUT CONTRAST TECHNIQUE: Contiguous axial images were obtained from the base of the skull through the vertex without intravenous contrast. COMPARISON:  05/14/2014.  Brain MR dated 11/13/2016. FINDINGS: Brain: Mild to moderate patchy white matter low density in both cerebral hemispheres without significant change since 2018. Normal size and position of the ventricles. No intracranial hemorrhage, mass lesion or CT evidence of acute infarction. Vascular: No hyperdense vessel or unexpected calcification. Skull: Normal. Negative for fracture or  focal lesion. Sinuses/Orbits: Unremarkable. Other: None. IMPRESSION: 1. No acute abnormality. 2. Stable mild-to-moderate chronic small vessel white matter ischemic changes in both cerebral hemispheres. Electronically Signed   By: Claudie Revering M.D.   On: 06/13/2021 15:16    EKG: Independently reviewed.  Normal sinus rhythm with right bundle branch block and  Assessment/Plan Principal Problem:   Syncope and collapse Active Problems:   Hypothyroidism   Diabetes mellitus type II, non insulin dependent (HCC)   Anxiety state   Essential hypertension   CAD (coronary artery disease)   Sleep apnea   Chronic diastolic CHF (congestive heart failure) (HCC)   Hypokalemia     #1. Syncope and collapse: Probably cardiac versus neurologic.  With the recurrent nature patient will be admitted and work-up initiated.  Get echocardiogram.  Hydrate patient.  Orthostatics.  May require cardiac consult at some point.  Blood sugar seems stable throughout her episodes.  Check thyroid panel.  Check oxygen levels.  If all tests are negative she may require a EP consult for testing.  #2 hypokalemia: Replete potassium.  #3 chronic diastolic heart failure: Appears compensated.  Continue home regimen  #4 diabetes: Non-insulin-dependent.  Continue sliding scale insulin  #5 coronary artery disease: No significant decompensation.  Continue supportive care and monitoring.  #6 obstructive sleep apnea: CPAP at night.  #7 hypothyroidism: Check thyroid panel.  Continue levothyroxine.  #8 anxiety disorder: Stable.  Not on active treatment.  Continue monitoring.  #9 gout: Continue allopurinol and colchicine     DVT prophylaxis: Lovenox Code Status: Full code Family Communication: No family at bedside Disposition Plan: Home Consults called: None Admission status: Observation  Severity of Illness: The appropriate patient status for this  patient is OBSERVATION. Observation status is judged to be reasonable and  necessary in order to provide the required intensity of service to ensure the patient's safety. The patient's presenting symptoms, physical exam findings, and initial radiographic and laboratory data in the context of their medical condition is felt to place them at decreased risk for further clinical deterioration. Furthermore, it is anticipated that the patient will be medically stable for discharge from the hospital within 2 midnights of admission.    Barbette Merino MD [facility] Pager [phone]   If 7PM-7AM, please contact night-coverage [email or web address] Password [facility]  06/13/2021, 7:27 PM

## 2021-06-13 NOTE — ED Triage Notes (Signed)
Pt presents via POV with c/o syncopal episodes. Pt states she will get very dizzy before episodes and pass out. Pt denies Blood thinner use, but states she did have falls at home. Pt alert and oriented x4 at this time. Pt has hx of CHF

## 2021-06-13 NOTE — Progress Notes (Signed)
PHARMACIST - PHYSICIAN COMMUNICATION  CONCERNING:  Enoxaparin (Lovenox) for DVT Prophylaxis    RECOMMENDATION: Patient was prescribed enoxaprin 40mg  q24 hours for VTE prophylaxis.   Filed Weights   06/13/21 1443  Weight: 88.5 kg (195 lb 1.7 oz)    Body mass index is 35.69 kg/m.  Estimated Creatinine Clearance: 55.2 mL/min (by C-G formula based on SCr of 0.91 mg/dL).   Based on [place] patient is candidate for enoxaparin 0.5mg /kg TBW SQ every 24 hours based on BMI being >30.  DESCRIPTION: Pharmacy has adjusted enoxaparin dose per [facility] policy.  Patient is now receiving enoxaparin 45 mg every 24 hours    Lorna Dibble, PharmD Clinical Pharmacist  06/13/2021 9:31 PM

## 2021-06-14 ENCOUNTER — Observation Stay: Payer: PPO

## 2021-06-14 ENCOUNTER — Observation Stay
Admit: 2021-06-14 | Discharge: 2021-06-14 | Disposition: A | Discharge status: Home / Self Care | Payer: PPO | Attending: Internal Medicine | Admitting: Internal Medicine

## 2021-06-14 DIAGNOSIS — R55 Syncope and collapse: Secondary | ICD-10-CM | POA: Diagnosis not present

## 2021-06-14 DIAGNOSIS — I10 Essential (primary) hypertension: Secondary | ICD-10-CM | POA: Diagnosis not present

## 2021-06-14 DIAGNOSIS — E119 Type 2 diabetes mellitus without complications: Secondary | ICD-10-CM | POA: Diagnosis not present

## 2021-06-14 LAB — COMPREHENSIVE METABOLIC PANEL
ALT: 23 U/L (ref 0–44)
AST: 29 U/L (ref 15–41)
Albumin: 3.6 g/dL (ref 3.5–5.0)
Alkaline Phosphatase: 84 U/L (ref 38–126)
Anion gap: 10 (ref 5–15)
BUN: 17 mg/dL (ref 8–23)
CO2: 27 mmol/L (ref 22–32)
Calcium: 9 mg/dL (ref 8.9–10.3)
Chloride: 103 mmol/L (ref 98–111)
Creatinine, Ser: 0.71 mg/dL (ref 0.44–1.00)
GFR, Estimated: >60 mL/min (ref >60)
Glucose, Bld: 157 mg/dL — ABNORMAL HIGH (ref 70–99)
Potassium: 3.4 mmol/L — ABNORMAL LOW (ref 3.5–5.1)
Sodium: 140 mmol/L (ref 135–145)
Total Bilirubin: 0.8 mg/dL (ref 0.3–1.2)
Total Protein: 6.3 g/dL — ABNORMAL LOW (ref 6.5–8.1)

## 2021-06-14 LAB — GLUCOSE, CAPILLARY
Glucose-Capillary: 170 mg/dL — ABNORMAL HIGH (ref 70–99)
Glucose-Capillary: 139 mg/dL — ABNORMAL HIGH (ref 70–99)
Glucose-Capillary: 151 mg/dL — ABNORMAL HIGH (ref 70–99)
Glucose-Capillary: 133 mg/dL — ABNORMAL HIGH (ref 70–99)
Glucose-Capillary: 117 mg/dL — ABNORMAL HIGH (ref 70–99)

## 2021-06-14 LAB — ECHOCARDIOGRAM COMPLETE
AR max vel: 1.71 cm2
AV Area VTI: 1.84 cm2
AV Area mean vel: 1.79 cm2
AV Mean grad: 4 mmHg
AV Peak grad: 7.7 mmHg
Ao pk vel: 1.39 m/s
Area-P 1/2: 4.08 cm2
MV VTI: 2.54 cm2
S' Lateral: 2.8 cm
Weight: 3121.71 oz

## 2021-06-14 LAB — PHOSPHORUS: Phosphorus: 3.8 mg/dL (ref 2.5–4.6)

## 2021-06-14 LAB — CBC
HCT: 44.3 % (ref 36.0–46.0)
Hemoglobin: 14.7 g/dL (ref 12.0–15.0)
MCH: 30.7 pg (ref 26.0–34.0)
MCHC: 33.2 g/dL (ref 30.0–36.0)
MCV: 92.5 fL (ref 80.0–100.0)
Platelets: 205 10*3/uL (ref 150–400)
RBC: 4.79 MIL/uL (ref 3.87–5.11)
RDW: 14.1 % (ref 11.5–15.5)
WBC: 8.2 10*3/uL (ref 4.0–10.5)
nRBC: 0 % (ref 0.0–0.2)

## 2021-06-14 LAB — IRON AND TIBC
Iron: 70 ug/dL (ref 28–170)
Saturation Ratios: 17 % (ref 10.4–31.8)
TIBC: 410 ug/dL (ref 250–450)
UIBC: 340 ug/dL

## 2021-06-14 LAB — VITAMIN B12: Vitamin B-12: 233 pg/mL (ref 180–914)

## 2021-06-14 LAB — VITAMIN D 25 HYDROXY (VIT D DEFICIENCY, FRACTURES): Vit D, 25-Hydroxy: 10.16 ng/mL — ABNORMAL LOW (ref 30–100)

## 2021-06-14 LAB — FOLATE: Folate: 13.7 ng/mL (ref >5.9)

## 2021-06-14 LAB — TSH: TSH: 1.338 u[IU]/mL (ref 0.350–4.500)

## 2021-06-14 LAB — MAGNESIUM: Magnesium: 1.9 mg/dL (ref 1.7–2.4)

## 2021-06-14 MED ORDER — COLCHICINE 0.6 MG PO TABS
0.6000 mg | ORAL_TABLET | Freq: Every day | ORAL | Status: DC | PRN
  Filled 2021-06-14: qty 1

## 2021-06-14 MED ORDER — ATORVASTATIN CALCIUM 20 MG PO TABS
20.0000 mg | ORAL_TABLET | Freq: Every day | ORAL | Status: DC
  Administered 2021-06-14 – 2021-06-20 (×7): 20 mg via ORAL
  Filled 2021-06-14 (×7): qty 1

## 2021-06-14 MED ORDER — LEVOTHYROXINE SODIUM 88 MCG PO TABS
88.0000 ug | ORAL_TABLET | Freq: Every day | ORAL | Status: DC
  Administered 2021-06-14 – 2021-06-20 (×7): 88 ug via ORAL
  Filled 2021-06-14 (×8): qty 1

## 2021-06-14 MED ORDER — SULFAMETHOXAZOLE-TRIMETHOPRIM 400-80 MG PO TABS
1.0000 | ORAL_TABLET | ORAL | Status: DC
  Administered 2021-06-15 – 2021-06-20 (×3): 1 via ORAL
  Filled 2021-06-14 (×3): qty 1

## 2021-06-14 MED ORDER — POTASSIUM CHLORIDE CRYS ER 20 MEQ PO TBCR
40.0000 meq | EXTENDED_RELEASE_TABLET | Freq: Once | ORAL | Status: AC
  Administered 2021-06-14: 40 meq via ORAL
  Filled 2021-06-14: qty 2

## 2021-06-14 MED ORDER — ALLOPURINOL 100 MG PO TABS
100.0000 mg | ORAL_TABLET | Freq: Every day | ORAL | Status: DC
  Administered 2021-06-14 – 2021-06-20 (×7): 100 mg via ORAL
  Filled 2021-06-14 (×7): qty 1

## 2021-06-14 MED ORDER — GABAPENTIN 400 MG PO CAPS
800.0000 mg | ORAL_CAPSULE | Freq: Three times a day (TID) | ORAL | Status: DC
  Administered 2021-06-14 (×2): 800 mg via ORAL
  Filled 2021-06-14 (×2): qty 2
  Filled 2021-06-14: qty 8
  Filled 2021-06-14: qty 2
  Filled 2021-06-14: qty 8
  Filled 2021-06-14: qty 2

## 2021-06-14 MED ORDER — GLIMEPIRIDE 4 MG PO TABS
4.0000 mg | ORAL_TABLET | Freq: Two times a day (BID) | ORAL | Status: DC
  Administered 2021-06-14 – 2021-06-20 (×13): 4 mg via ORAL
  Filled 2021-06-14 (×15): qty 1

## 2021-06-14 MED ORDER — GABAPENTIN 300 MG PO CAPS
600.0000 mg | ORAL_CAPSULE | Freq: Three times a day (TID) | ORAL | Status: DC
  Administered 2021-06-14 – 2021-06-20 (×19): 600 mg via ORAL
  Filled 2021-06-14 (×19): qty 2

## 2021-06-14 MED ORDER — ALBUTEROL SULFATE (2.5 MG/3ML) 0.083% IN NEBU: 2.5000 mg | INHALATION_SOLUTION | RESPIRATORY_TRACT | Status: DC | PRN

## 2021-06-14 MED ORDER — ASPIRIN EC 81 MG PO TBEC
81.0000 mg | DELAYED_RELEASE_TABLET | Freq: Every day | ORAL | Status: DC
  Administered 2021-06-14 – 2021-06-20 (×7): 81 mg via ORAL
  Filled 2021-06-14 (×7): qty 1

## 2021-06-14 MED ORDER — MYCOPHENOLATE MOFETIL 250 MG PO CAPS
250.0000 mg | ORAL_CAPSULE | Freq: Two times a day (BID) | ORAL | Status: DC
  Administered 2021-06-14 – 2021-06-20 (×13): 250 mg via ORAL
  Filled 2021-06-14 (×14): qty 1

## 2021-06-14 MED ORDER — MECLIZINE HCL 25 MG PO TABS
25.0000 mg | ORAL_TABLET | Freq: Once | ORAL | Status: AC
  Administered 2021-06-14: 25 mg via ORAL
  Filled 2021-06-14: qty 1

## 2021-06-14 MED ORDER — VITAMIN D (ERGOCALCIFEROL) 1.25 MG (50000 UNIT) PO CAPS
50000.0000 [IU] | ORAL_CAPSULE | ORAL | Status: DC
  Administered 2021-06-14: 50000 [IU] via ORAL
  Filled 2021-06-14: qty 1

## 2021-06-14 NOTE — Consult Note (Signed)
CARDIOLOGY CONSULT NOTE               Patient ID: LEVIE OWENSBY MRN: [id number] DOB/AGE: Nov 25, 1945 75 y.o.  Admit date: 06/13/2021 Referring Physician Dwyane Dee Primary Physician Rosanna Randy Primary Cardiologist [facility] Reason for Consultation syncope  HPI: 75 year old female referred for evaluation of syncope. The patient has a history of type II diabetes, hyperlipidemia, hypertension, and post COVID interstitial lung disease. The patient reports a 2-week history of recurrent syncope, which she reports has been occurring nearly on a daily basis. The patient denies a positional component. She reports occasional chest pain that is pleuritic and musculoskeletal in nature. She states the stethoscope on her chest feels tender. Prior to the syncopal episodes, which occur with and without exertion without known triggers, she experiences preceding dizziness and lightheadedness. She complains of associated pain on the left side of her neck to the top of her head. Labs are notable for normal high sensitivity troponin x 2 (7 and 5), K 3.4, very low vitamin D (10.16), normal BNP, normal TSH. Head CT was negative for acute abnormality. ECG showed sinus rhythm at a rate of 74 bpm with known RBBB. Chest CT on 01/06/2021 showed three vessel coronary atherosclerosis.  Review of systems complete and found to be negative unless listed above     Past Medical History:  Diagnosis Date   Complication of anesthesia    Depression    Diabetes mellitus without complication (HCC)    Dyspnea    DOE   Edema    FEET/LEGS   GERD (gastroesophageal reflux disease)    Gout    Headache    Heart murmur    History of hiatal hernia    History of orthopnea    Hypertension    Hypothyroidism    PONV (postoperative nausea and vomiting)    Scleroderma (HCC)    hands   Sleep apnea    NO CPAP   Thyroid disease    Vertigo     Past Surgical History:  Procedure Laterality Date   ABDOMINAL HYSTERECTOMY     ovaries  intact   BLADDER SURGERY     bladder tuck   BREAST BIOPSY Right 03/20/2016   neg x 2 area   CARDIAC CATHETERIZATION     CARPAL TUNNEL RELEASE     CATARACT EXTRACTION W/PHACO Left 09/17/2018   Procedure: CATARACT EXTRACTION PHACO AND INTRAOCULAR LENS PLACEMENT ([place]) LEFT, DIABETIC;  Surgeon: Birder Robson, MD;  Location: [facility] ORS;  Service: Ophthalmology;  Laterality: Left;  [place] 00:34 CDE 4.85 Fluid pack lot # [id number]   CATARACT EXTRACTION W/PHACO Right 10/15/2018   Procedure: CATARACT EXTRACTION PHACO AND INTRAOCULAR LENS PLACEMENT (IOC)-RIGHT;  Surgeon: Birder Robson, MD;  Location: [facility] ORS;  Service: Ophthalmology;  Laterality: Right;  [place] 00:27.6 CDE 3.43 Fluid Pack Lot # [id number]   COLONOSCOPY WITH PROPOFOL N/A 09/27/2018   Procedure: COLONOSCOPY WITH PROPOFOL;  Surgeon: Virgel Manifold, MD;  Location: [facility] ENDOS[facility];  Service: Endoscopy;  Laterality: N/A;   DILATION AND CURETTAGE OF UTERUS     EYE SURGERY     eyelid   FRACTURE SURGERY     left ankle-plate and screws palced   LITHOTRIPSY     NISSEN FUNDOPLICATION     SHOULDER SURGERY Right    TONSILLECTOMY      Medications Prior to Admission  Medication Sig Dispense Refill Last Dose   allopurinol (ZYLOPRIM) 100 MG tablet TAKE 1 TABLET BY MOUTH EVERY DAY 90 tablet 0 06/12/2021 at 2100  atorvastatin (LIPITOR) 10 MG tablet TAKE 1 TABLET BY MOUTH EVERYDAY AT BEDTIME 90 tablet 3 06/12/2021 at 2100   furosemide (LASIX) 40 MG tablet TAKE 1 TABLET BY MOUTH EVERY DAY 90 tablet 1 06/13/2021 at 0900   gabapentin (NEURONTIN) 800 MG tablet Take 1 tablet (800 mg total) by mouth 3 (three) times daily. 270 tablet 3 06/13/2021 at 0900   glimepiride (AMARYL) 4 MG tablet TAKE 1 TABLET (4 MG TOTAL) BY MOUTH 2 (TWO) TIMES DAILY. 180 tablet 1 06/13/2021 at 0900   JARDIANCE 25 MG TABS tablet TAKE 25 MG BY MOUTH DAILY. 90 tablet 1 06/13/2021 at 0900   levothyroxine (SYNTHROID) 88 MCG tablet TAKE 1 TABLET BY MOUTH EVERY DAY 90 tablet 3 06/13/2021  at 0900   metFORMIN (GLUCOPHAGE) 1000 MG tablet TAKE 1 TABLET BY MOUTH TWICE A DAY 180 tablet 0 06/13/2021 at 0900   mycophenolate (CELLCEPT) 250 MG capsule Take 250 mg by mouth every 12 (twelve) hours.   06/13/2021 at 0900   pioglitazone (ACTOS) 30 MG tablet TAKE 1 TABLET BY MOUTH EVERY DAY 90 tablet 1 06/13/2021 at 0900   sulfamethoxazole-trimethoprim (BACTRIM) 400-80 MG tablet Take 1 tablet by mouth 3 (three) times a week.   06/13/2021 at 0900   acetaminophen (TYLENOL) 500 MG tablet Take 500 mg by mouth every 6 (six) hours as needed for mild pain or headache.   prn at unknown   albuterol (VENTOLIN HFA) 108 (90 Base) MCG/ACT inhaler SMARTSIG:2 Inhalation Via Inhaler Every 6 Hours PRN   prn at unknown   benzonatate (TESSALON) 200 MG capsule Take 1 capsule (200 mg total) by mouth 3 (three) times daily. (Patient not taking: No sig reported)  0 Not Taking   budesonide (PULMICORT) 0.5 MG/2ML nebulizer solution Take 0.5 mg by nebulization as needed.      colchicine 0.6 MG tablet Take 0.6 mg by mouth daily as needed (for gout flare-ups).    prn at unknown   Continuous Blood Gluc Receiver (FREESTYLE LIBRE 2 READER) DEVI 1 each by Does not apply route continuous. 1 each 0    Continuous Blood Gluc Sensor (FREESTYLE LIBRE 2 SENSOR) MISC USE AS DIRECTED 4 each 12    fluconazole (DIFLUCAN) 100 MG tablet Take 100 mg by mouth daily.   prn at unknown   nystatin cream (MYCOSTATIN) Apply 1 application topically 2 (two) times daily. (Patient taking differently: Apply 1 application topically 2 (two) times daily as needed (for diabetic yeast reactions.).) 30 g 0 prn at unknown   ondansetron (ZOFRAN) 4 MG tablet TAKE 1 TABLET BY MOUTH EVERY 8 HOURS AS NEEDED FOR NAUSEA AND VOMITING 45 tablet 0 prn at unknown   OZEMPIC, 0.25 OR 0.5 MG/DOSE, 2 MG/1.5ML SOPN INJECT 0.5MG  INTO THE SKIN ONCE A WEEK. 4.5 mL 2 06/09/2021   pantoprazole (PROTONIX) 20 MG tablet Take by mouth.      Social History   Socioeconomic History   Marital  status: Divorced    Spouse name: Not on file   Number of children: 2   Years of education: Not on file   Highest education level: Associate degree: occupational, Hotel manager, or vocational program  Occupational History   Occupation: retired  Tobacco Use   Smoking status: Never   Smokeless tobacco: Never  Vaping Use   Vaping Use: Never used  Substance and Sexual Activity   Alcohol use: No    Alcohol/week: 0.0 standard drinks   Drug use: Never   Sexual activity: Not on file  Other Topics  Concern   Not on file  Social History Narrative   Not on file   Social Determinants of Health   Financial Resource Strain: High Risk   Difficulty of Paying Living Expenses: Hard  Food Insecurity: No Food Insecurity   Worried About Running Out of Food in the Last Year: Never true   Ran Out of Food in the Last Year: Never true  Transportation Needs: No Transportation Needs   Lack of Transportation (Medical): No   Lack of Transportation (Non-Medical): No  Physical Activity: Sufficiently Active   Days of Exercise per Week: 7 days   Minutes of Exercise per Session: 30 min  Stress: Stress Concern Present   Feeling of Stress : Very much  Social Connections: Moderately Integrated   Frequency of Communication with Friends and Family: More than three times a week   Frequency of Social Gatherings with Friends and Family: More than three times a week   Attends Religious Services: More than 4 times per year   Active Member of [employer] or Organizations: Yes   Attends Music therapist: More than 4 times per year   Marital Status: Divorced  Human resources officer Violence: Not At Risk   Fear of Current or Ex-Partner: No   Emotionally Abused: No   Physically Abused: No   Sexually Abused: No    Family History  Problem Relation Age of Onset   Stroke Mother    Hypertension Mother    Heart disease Mother    Arthritis Mother    Heart disease Father    Hypertension Father    Diabetes Sister     Hypertension Sister    Asthma Sister    Hypertension Sister    Diverticulitis Sister    Colon cancer Maternal Grandmother    Breast cancer Maternal Grandmother    Breast cancer Maternal Aunt       Review of systems complete and found to be negative unless listed above      PHYSICAL EXAM  General: Well developed, well nourished, in no acute distress, sitting up in bed eating lunch HEENT:  Normocephalic and atramatic Neck:  No JVD.  Lungs: Clear bilaterally to auscultation, normal effort of breathing on RA Heart: HRRR . Normal S1 and S2 without gallops or murmurs.  Abdomen: nondistended. Extremities: No clubbing, cyanosis or edema.   Neuro: Alert and oriented X 3. Psych:  Good affect, responds appropriately  Labs:   Lab Results  Component Value Date   WBC 8.2 06/14/2021   HGB 14.7 06/14/2021   HCT 44.3 06/14/2021   MCV 92.5 06/14/2021   PLT 205 06/14/2021    Recent Labs  Lab 06/14/21 0538  NA 140  K 3.4*  CL 103  CO2 27  BUN 17  CREATININE 0.71  CALCIUM 9.0  PROT 6.3*  BILITOT 0.8  ALKPHOS 84  ALT 23  AST 29  GLUCOSE 157*   Lab Results  Component Value Date   CKTOTAL 169 07/29/2012   CKMB 0.6 07/29/2012   TROPONINI < 0.02 07/29/2012    Lab Results  Component Value Date   CHOL 182 05/16/2021   CHOL 153 09/14/2020   CHOL 162 09/18/2019   Lab Results  Component Value Date   HDL 49 05/16/2021   HDL 53 09/14/2020   HDL 47 09/18/2019   Lab Results  Component Value Date   LDLCALC 95 05/16/2021   LDLCALC 73 09/14/2020   LDLCALC 80 09/18/2019   Lab Results  Component Value Date  TRIG 225 (H) 05/16/2021   TRIG 160 (H) 09/14/2020   TRIG 207 (H) 09/18/2019   Lab Results  Component Value Date   CHOLHDL 3.7 05/16/2021   CHOLHDL 2.9 09/14/2020   CHOLHDL 3.4 09/18/2019   No results found for: LDLDIRECT    Radiology: CT HEAD WO CONTRAST  Result Date: 06/13/2021 CLINICAL DATA:  Dizziness.  Multiple syncopal episodes. EXAM: CT HEAD WITHOUT  CONTRAST TECHNIQUE: Contiguous axial images were obtained from the base of the skull through the vertex without intravenous contrast. COMPARISON:  05/14/2014.  Brain MR dated 11/13/2016. FINDINGS: Brain: Mild to moderate patchy white matter low density in both cerebral hemispheres without significant change since 2018. Normal size and position of the ventricles. No intracranial hemorrhage, mass lesion or CT evidence of acute infarction. Vascular: No hyperdense vessel or unexpected calcification. Skull: Normal. Negative for fracture or focal lesion. Sinuses/Orbits: Unremarkable. Other: None. IMPRESSION: 1. No acute abnormality. 2. Stable mild-to-moderate chronic small vessel white matter ischemic changes in both cerebral hemispheres. Electronically Signed   By: Claudie Revering M.D.   On: 06/13/2021 15:16   MR BRAIN WO CONTRAST  Result Date: 06/13/2021 CLINICAL DATA:  Initial evaluation for neuro deficit, stroke suspected. EXAM: MRI HEAD WITHOUT CONTRAST TECHNIQUE: Multiplanar, multiecho pulse sequences of the brain and surrounding structures were obtained without intravenous contrast. COMPARISON:  Prior head CT from earlier the same day as well as previous MRI from 11/13/2016. FINDINGS: Brain: Mild diffuse prominence of the CSF containing spaces compatible generalized age-related cerebral atrophy. Patchy and confluent T2/FLAIR hyperintensity within the periventricular and deep white matter both cerebral hemispheres most consistent with chronic small vessel ischemic disease, overall moderate in nature. No abnormal foci of restricted diffusion to suggest acute or subacute ischemia. Gray-white matter differentiation maintained. No encephalomalacia to suggest chronic cortical infarction. No foci of susceptibility artifact to suggest acute or chronic intracranial hemorrhage. No mass lesion, midline shift or mass effect. No hydrocephalus or extra-axial fluid collection. Pituitary gland suprasellar region normal. Midline  structures intact. Vascular: Major intracranial vascular flow voids are maintained. Skull and upper cervical spine: Craniocervical junction within normal limits. Bone marrow signal intensity normal. No scalp soft tissue abnormality. Sinuses/Orbits: Patient status post bilateral ocular lens replacement. Globes orbital soft tissues demonstrate no acute finding. Paranasal sinuses are largely clear. No significant mastoid effusion. Inner ear structures grossly normal. Other: None. IMPRESSION: 1. No acute intracranial abnormality. 2. Generalized age-related cerebral atrophy with moderate chronic microvascular ischemic disease. Electronically Signed   By: Jeannine Boga M.D.   On: 06/13/2021 20:48    EKG: sinus rhythm, 74 bpm, 1st degree AV block, Qtc 481 ms  ASSESSMENT AND PLAN:  Recurrent syncope, with preceding dizziness and lightheadedness with atypical, pleuritic chest pain with a musculoskeletal component with negative troponin x 2. Post Covid interstitial lung disease CAD per CT   Plan: Continue to monitor on telemetry; recommend [facility] placing a 2-week Holter monitor at discharge Review 2D echocardiogram Carotid ultrasound Supplement vitamin D Continue aspirin, statin 6.   Further recommendations pending patient's initial course  Signed: Sharolyn Douglas 06/14/2021, 12:17 PM

## 2021-06-14 NOTE — Progress Notes (Signed)
PT Cancellation Note  Patient Details Name: Teresa [name] MRN: [id number] DOB: Oct 17, 1945   Cancelled Treatment:    Reason Eval/Treat Not Completed: Patient at procedure or test/unavailable Pt not in room for testing. PT to reassess as able.    [employer], SPT

## 2021-06-14 NOTE — Progress Notes (Signed)
*  PRELIMINARY RESULTS* Echocardiogram 2D Echocardiogram has been performed.  Teresa [name] [name] 06/14/2021, 11:49 AM

## 2021-06-14 NOTE — Evaluation (Signed)
Occupational Therapy Evaluation Patient Details Name: Teresa [name] MRN: [id number] DOB: 1945-11-15 Today's Date: 06/14/2021   History of Present Illness 75 y.o. female with medical history significant of coronary artery disease, depression, GERD, nonspecific heart murmur, hypothyroidism, essential hypertension, scleroderma, obstructive sleep apnea, who has been having recurrent syncopal episodes she had 2 episodes on Saturday 2 episodes on Sunday and one episode today.  It usually happens suddenly with no dizziness.  No prior episodes.  No fever or chills.  She is a diabetic with known coronary artery disease   Clinical Impression   Patient presenting with decreased Ind in self care, balance, functional mobility/transfers, endurance, and safety awareness. Patient reports living at home alone and working full time as Therapist, sports in memory care unit. Pt endorses daily falls and needing more effort to care for herself at baseline. Pt having difficulty move B UEs against gravity past 90 degrees for shoulder elevation and reporting this as baseline.Patient currently functioning at mod- max A for bed mobility. She reports having headache and very dizzy once seated and declines OOB/standing tasks. Pt only has intermittent assistance at discharge from neighbor and therefore would need short term rehab to address functional deficits.  Patient will benefit from acute OT to increase overall independence in the areas of ADLs, functional mobility, and safety awareness in order to safely discharge to next venue of care.      Recommendations for follow up therapy are one component of a multi-disciplinary discharge planning process, led by the attending physician.  Recommendations may be updated based on patient status, additional functional criteria and insurance authorization.   Follow Up Recommendations  Skilled nursing-short term rehab (<3 hours/day)    Assistance Recommended at Discharge Intermittent  Supervision/Assistance  Functional Status Assessment  Patient has had a recent decline in their functional status and demonstrates the ability to make significant improvements in function in a reasonable and predictable amount of time.  Equipment Recommendations  Other (comment) (defer to next venue of care)       Precautions / Restrictions Precautions Precautions: Fall      Mobility Bed Mobility Overal bed mobility: Needs Assistance Bed Mobility: Supine to Sit;Sit to Supine     Supine to sit: Mod assist Sit to supine: Max assist   General bed mobility comments: assist for trunk and B LEs with cuing for hand placement and technique    Transfers                   General transfer comment: not attempted secondary to dizziness and pt feeling unwell      Balance Overall balance assessment: Needs assistance Sitting-balance support: Feet supported;Bilateral upper extremity supported Sitting balance-Leahy Scale: Fair                                     ADL either performed or assessed with clinical judgement   ADL Overall ADL's : Needs assistance/impaired Eating/Feeding: Set up   Grooming: Wash/dry hands;Wash/dry face;Set up;Bed level               Lower Body Dressing: Maximal assistance                       Vision Patient Visual Report: No change from baseline              Pertinent Vitals/Pain Pain Assessment: 0-10 Pain Score: 6  Pain Location: headache  Pain Descriptors / Indicators: Aching;Grimacing;Discomfort Pain Intervention(s): Limited activity within patient's tolerance;Monitored during session;Repositioned     Hand Dominance Right   Extremity/Trunk Assessment Upper Extremity Assessment Upper Extremity Assessment: Generalized weakness;RUE deficits/detail;LUE deficits/detail RUE Deficits / Details: Pt actively shakes and grimaces when trying to perform shoulder elevation above 90 degrees LUE Deficits / Details: Pt  actively shakes and grimaces when trying to perform shoulder elevation above 90 degrees   Lower Extremity Assessment Lower Extremity Assessment: Generalized weakness       Communication Communication Communication: No difficulties   Cognition Arousal/Alertness: Awake/alert Behavior During Therapy: WFL for tasks assessed/performed Overall Cognitive Status: Within Functional Limits for tasks assessed                                                  Home Living Family/patient expects to be discharged to:: Private residence Living Arrangements: Alone Available Help at Discharge: Neighbor;Available PRN/intermittently Type of Home: Other(Comment) (town home) Home Access: Stairs to enter Technical brewer of Steps: 1 Entrance Stairs-Rails: None Home Layout: Two level;Bed/bath upstairs Alternate Level Stairs-Number of Steps: 14   Bathroom Shower/Tub: Teacher, early years/pre: Handicapped height     Home Equipment: Conservation officer, nature (2 wheels)          Prior Functioning/Environment Prior Level of Function : Independent/Modified Independent             Mobility Comments: Pt reports ind with mobility but falling daily per her report. 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 Pt reports being independent in self care tasks but that things have been progressively getting more difficult.        OT Problem List: Decreased strength;Decreased activity tolerance;Decreased range of motion;Impaired balance (sitting and/or standing);Decreased safety awareness;Decreased knowledge of use of DME or AE;Impaired UE functional use      OT Treatment/Interventions: Self-care/ADL training;Therapeutic exercise;Therapeutic activities;Energy conservation;DME and/or AE instruction;Patient/family education;Manual therapy;Balance training    OT Goals(Current goals can be found in the care plan section) Acute Rehab OT  Goals Patient Stated Goal: to return home OT Goal Formulation: With patient Time For Goal Achievement: 06/28/21 Potential to Achieve Goals: Fair  OT Frequency: Min 2X/week   Barriers to D/C: Decreased caregiver support  Pt lives alone          AM-PAC OT "6 Clicks" Daily Activity     Outcome Measure Help from another person eating meals?: None Help from another person taking care of personal grooming?: A Little   Help from another person bathing (including washing, rinsing, drying)?: A Lot Help from another person to put on and taking off regular upper body clothing?: A Lot Help from another person to put on and taking off regular lower body clothing?: A Lot 6 Click Score: 13   End of Session Nurse Communication: Mobility status;Precautions  Activity Tolerance: Patient limited by pain;Treatment limited secondary to medical complications (Comment) Patient left: with call bell/phone within reach;in bed;with bed alarm set  OT Visit Diagnosis: Unsteadiness on feet (R26.81);Repeated falls (R29.6);Muscle weakness (generalized) (M62.81);History of falling (Z91.81)                Time: 0109-3235 OT Time Calculation (min): 30 min Charges:  OT General Charges $OT Visit: 1 Visit OT Evaluation $OT Eval Moderate Complexity: 1 Mod OT Treatments $Self Care/Home Management :  8-22 mins $Therapeutic Activity: 8-22 mins  Darleen Crocker, MS, OTR/L , CBIS ascom [phone]  06/14/21, 4:26 PM

## 2021-06-14 NOTE — Progress Notes (Signed)
[facility] Progress Note  Patient: Teresa [name]    SHF:[id number]  DOA: 06/13/2021     Date of Service: the patient was seen and examined on 06/14/2021  Chief Complaint  Patient presents with   Near Syncope   Brief hospital course: KAHLIE DEUTSCHER is a 75 y.o. female with medical history significant of coronary artery disease, depression, GERD, nonspecific heart murmur, hypothyroidism, essential hypertension, scleroderma, obstructive sleep apnea, who has been having recurrent syncopal episodes she had 2 episodes on Saturday 2 episodes on Sunday and one episode today.  It usually happens suddenly with no dizziness.  No prior episodes.  No fever or chills.  She is a diabetic with known coronary artery disease.  No recent change in her medications.  Denied any prodrome.  Patient is being admitted for syncopal work-up..   ED Course: Temperature 98.6 blood pressure 140/118, pulse 92, respirate 20 and oxygen sat 95% room air.  Sodium 139 potassium 3.4 chloride 100 CO2 28 glucose 146.  BUN 19 creatinine 0.99 calcium 9.2.  CBC all within normal.  Urinalysis negative.  Influenza and COVID-19 negative.  Head CT without contrast is negative.  MRI of the brain ordered.  Patient being admitted for further evaluation and treatment.      Assessment and Plan: Principal Problem:   Syncope and collapse Active Problems:   Hypothyroidism   Diabetes mellitus type II, non insulin dependent (HCC)   Anxiety state   Essential hypertension   CAD (coronary artery disease)   Sleep apnea   Chronic diastolic CHF (congestive heart failure) (HCC)   Hypokalemia       # Syncope and collapse: Probably cardiac versus neurologic.   Continue to monitor on telemetry Blood glucose seems within normal range, orthostatics were negative which will be repeated, RN was advised to repeat orthostatics every 4 hourly Troponin negative, BNP within normal range Potassium level 3.4, repleted, mag 1.9 CT head no acute  findings, MRI brain no acute findings 2D echocardiogram pending Follow thyroid panel Continue fall precaution Follow cardiology for further recommendation    # hypokalemia: Replete potassium.   # chronic diastolic heart failure: Appears compensated.  Continue home regimen   # diabetes: Non-insulin-dependent.  Continue sliding scale insulin   # coronary artery disease: No significant decompensation.  Continue supportive care and monitoring.   # obstructive sleep apnea: CPAP at night.   # hypothyroidism: Check thyroid panel.  Continue levothyroxine.   # anxiety disorder: Stable.  Not on active treatment.  Continue monitoring.   # gout: Continue allopurinol and colchicine  # Vitamin D deficiency, started vitamin D 53 units p.o. weekly, follow with PCP to repeat vitamin D level after 3 months.  Body mass index is 35.69 kg/m.  Interventions:       Diet: Heart healthy/carb modified DVT Prophylaxis: Subcutaneous Lovenox   Advance goals of care discussion: Full code  Family Communication: family was not present at bedside, at the time of interview.  The pt provided permission to discuss medical plan with the family. Opportunity was given to ask question and all questions were answered satisfactorily.   Disposition:  Pt is from Home, admitted with recurrent syncopal episodes, still has syncope, high risk of fall and readmission, pending 2D echocardiogram and cardiology consult, which precludes a safe discharge. Discharge to home, when stable and cleared by cardiology..  Subjective: No significant events overnight, patient was admitted due to syncopal episodes for the past 2 weeks.  Currently patient denies any complaints but  still work-up is pending, will need echocardiogram and cardiology consult, will need to monitor orthostatics and we need to ambulate patient in the hallway before planning for disposition. Patient remains at high risk for discharge at this time so will need  overnight stay, we will reassess tomorrow a.m.   Physical Exam: General:  alert oriented to time, place, and person.  Appear in no distress, affect appropriate Eyes: PERRLA ENT: Oral Mucosa Clear, moist  Neck:  no JVD,  Cardiovascular: S1 and S2 Present, no Murmur,  Respiratory: good respiratory effort, Bilateral Air entry equal and Decreased, no Crackles, no wheezes Abdomen: Bowel Sound present, Soft and no tenderness,  Skin: no rashes Extremities: no Pedal edema, no calf tenderness Neurologic: without any new focal findings Gait not checked due to patient safety concerns  Vitals:   06/13/21 2235 06/14/21 0128 06/14/21 0517 06/14/21 0752  BP: 131/66 113/63 116/65 111/63  Pulse: 92 75 69 (!) 59  Resp: 16  16 20   Temp: 98.6 F (37 C)  97.7 F (36.5 C) 97.7 F (36.5 C)  TempSrc:   Oral   SpO2: 99% 95% 95% 96%  Weight:        Intake/Output Summary (Last 24 hours) at 06/14/2021 1148 Last data filed at 06/14/2021 1007 Gross per 24 hour  Intake 980 ml  Output 700 ml  Net 280 ml   Filed Weights   06/13/21 1443  Weight: 88.5 kg    Data Reviewed: I have personally reviewed and interpreted daily labs, tele strips, imagings as discussed above. I reviewed all nursing notes, pharmacy notes, vitals, pertinent old records I have discussed plan of care as described above with RN and patient/family.  CBC: Recent Labs  Lab 06/13/21 1440 06/13/21 2135 06/14/21 0538  WBC 9.7 7.8 8.2  HGB 15.6* 14.3 14.7  HCT 47.6* 44.1 44.3  MCV 91.9 92.1 92.5  PLT 238 207 387   Basic Metabolic Panel: Recent Labs  Lab 06/13/21 1440 06/13/21 2135 06/14/21 0538  NA 139  --  140  K 3.4*  --  3.4*  CL 100  --  103  CO2 28  --  27  GLUCOSE 146*  --  157*  BUN 19  --  17  CREATININE 0.91 0.60 0.71  CALCIUM 9.2  --  9.0  MG  --   --  1.9  PHOS  --   --  3.8    Studies: CT HEAD WO CONTRAST  Result Date: 06/13/2021 CLINICAL DATA:  Dizziness.  Multiple syncopal episodes. EXAM: CT HEAD  WITHOUT CONTRAST TECHNIQUE: Contiguous axial images were obtained from the base of the skull through the vertex without intravenous contrast. COMPARISON:  05/14/2014.  Brain MR dated 11/13/2016. FINDINGS: Brain: Mild to moderate patchy white matter low density in both cerebral hemispheres without significant change since 2018. Normal size and position of the ventricles. No intracranial hemorrhage, mass lesion or CT evidence of acute infarction. Vascular: No hyperdense vessel or unexpected calcification. Skull: Normal. Negative for fracture or focal lesion. Sinuses/Orbits: Unremarkable. Other: None. IMPRESSION: 1. No acute abnormality. 2. Stable mild-to-moderate chronic small vessel white matter ischemic changes in both cerebral hemispheres. Electronically Signed   By: Claudie Revering M.D.   On: 06/13/2021 15:16   MR BRAIN WO CONTRAST  Result Date: 06/13/2021 CLINICAL DATA:  Initial evaluation for neuro deficit, stroke suspected. EXAM: MRI HEAD WITHOUT CONTRAST TECHNIQUE: Multiplanar, multiecho pulse sequences of the brain and surrounding structures were obtained without intravenous contrast. COMPARISON:  Prior head CT  from earlier the same day as well as previous MRI from 11/13/2016. FINDINGS: Brain: Mild diffuse prominence of the CSF containing spaces compatible generalized age-related cerebral atrophy. Patchy and confluent T2/FLAIR hyperintensity within the periventricular and deep white matter both cerebral hemispheres most consistent with chronic small vessel ischemic disease, overall moderate in nature. No abnormal foci of restricted diffusion to suggest acute or subacute ischemia. Gray-white matter differentiation maintained. No encephalomalacia to suggest chronic cortical infarction. No foci of susceptibility artifact to suggest acute or chronic intracranial hemorrhage. No mass lesion, midline shift or mass effect. No hydrocephalus or extra-axial fluid collection. Pituitary gland suprasellar region normal.  Midline structures intact. Vascular: Major intracranial vascular flow voids are maintained. Skull and upper cervical spine: Craniocervical junction within normal limits. Bone marrow signal intensity normal. No scalp soft tissue abnormality. Sinuses/Orbits: Patient status post bilateral ocular lens replacement. Globes orbital soft tissues demonstrate no acute finding. Paranasal sinuses are largely clear. No significant mastoid effusion. Inner ear structures grossly normal. Other: None. IMPRESSION: 1. No acute intracranial abnormality. 2. Generalized age-related cerebral atrophy with moderate chronic microvascular ischemic disease. Electronically Signed   By: Jeannine Boga M.D.   On: 06/13/2021 20:48    Scheduled Meds:  allopurinol  100 mg Oral Daily   atorvastatin  20 mg Oral Daily   enoxaparin (LOVENOX) injection  0.5 mg/kg Subcutaneous Q24H   gabapentin  800 mg Oral TID   glimepiride  4 mg Oral BID   insulin aspart  0-15 Units Subcutaneous TID WC   insulin aspart  0-5 Units Subcutaneous QHS   levothyroxine  88 mcg Oral Q0600   sodium chloride flush  3 mL Intravenous Q12H   Continuous Infusions: PRN Meds: acetaminophen **OR** acetaminophen, albuterol, colchicine, ondansetron **OR** ondansetron (ZOFRAN) IV  Time spent: 35 minutes  Author: Val Riles. MD [facility] 06/14/2021 11:48 AM  To reach On-call, see care teams to locate the attending and reach out to them via [email or web address]. If 7PM-7AM, please contact night-coverage If you still have difficulty reaching the attending provider, please page the [facility] (Director on Call) for [facility] on amion for assistance.

## 2021-06-14 NOTE — Progress Notes (Signed)
OT Cancellation Note  Patient Details Name: Teresa [name] MRN: [id number] DOB: [birth year]   Cancelled Treatment:    Reason Eval/Treat Not Completed: Patient at procedure or test/ unavailable. OT order received and chart reviewed. Pt currently off unit. OT will re-attempt when pt is next available for OT intervention.   Darleen Crocker, MS, OTR/L , CBIS ascom [phone]  06/14/21, 2:24 PM

## 2021-06-15 ENCOUNTER — Observation Stay: Payer: PPO

## 2021-06-15 DIAGNOSIS — E039 Hypothyroidism, unspecified: Secondary | ICD-10-CM | POA: Diagnosis present

## 2021-06-15 DIAGNOSIS — F419 Anxiety disorder, unspecified: Secondary | ICD-10-CM | POA: Diagnosis present

## 2021-06-15 DIAGNOSIS — F411 Generalized anxiety disorder: Secondary | ICD-10-CM | POA: Diagnosis not present

## 2021-06-15 DIAGNOSIS — F32A Depression, unspecified: Secondary | ICD-10-CM | POA: Diagnosis present

## 2021-06-15 DIAGNOSIS — I4510 Unspecified right bundle-branch block: Secondary | ICD-10-CM | POA: Diagnosis present

## 2021-06-15 DIAGNOSIS — I440 Atrioventricular block, first degree: Secondary | ICD-10-CM | POA: Diagnosis present

## 2021-06-15 DIAGNOSIS — I110 Hypertensive heart disease with heart failure: Secondary | ICD-10-CM | POA: Diagnosis present

## 2021-06-15 DIAGNOSIS — E1165 Type 2 diabetes mellitus with hyperglycemia: Secondary | ICD-10-CM | POA: Diagnosis present

## 2021-06-15 DIAGNOSIS — M349 Systemic sclerosis, unspecified: Secondary | ICD-10-CM | POA: Diagnosis present

## 2021-06-15 DIAGNOSIS — G43909 Migraine, unspecified, not intractable, without status migrainosus: Secondary | ICD-10-CM | POA: Diagnosis present

## 2021-06-15 DIAGNOSIS — G4733 Obstructive sleep apnea (adult) (pediatric): Secondary | ICD-10-CM | POA: Diagnosis present

## 2021-06-15 DIAGNOSIS — E119 Type 2 diabetes mellitus without complications: Secondary | ICD-10-CM | POA: Diagnosis not present

## 2021-06-15 DIAGNOSIS — K219 Gastro-esophageal reflux disease without esophagitis: Secondary | ICD-10-CM | POA: Diagnosis present

## 2021-06-15 DIAGNOSIS — E1140 Type 2 diabetes mellitus with diabetic neuropathy, unspecified: Secondary | ICD-10-CM | POA: Diagnosis present

## 2021-06-15 DIAGNOSIS — Z6837 Body mass index (BMI) 37.0-37.9, adult: Secondary | ICD-10-CM | POA: Diagnosis not present

## 2021-06-15 DIAGNOSIS — J849 Interstitial pulmonary disease, unspecified: Secondary | ICD-10-CM | POA: Diagnosis present

## 2021-06-15 DIAGNOSIS — E785 Hyperlipidemia, unspecified: Secondary | ICD-10-CM | POA: Diagnosis present

## 2021-06-15 DIAGNOSIS — E538 Deficiency of other specified B group vitamins: Secondary | ICD-10-CM | POA: Diagnosis present

## 2021-06-15 DIAGNOSIS — R55 Syncope and collapse: Secondary | ICD-10-CM | POA: Diagnosis present

## 2021-06-15 DIAGNOSIS — E876 Hypokalemia: Secondary | ICD-10-CM | POA: Diagnosis present

## 2021-06-15 DIAGNOSIS — I2510 Atherosclerotic heart disease of native coronary artery without angina pectoris: Secondary | ICD-10-CM | POA: Diagnosis present

## 2021-06-15 DIAGNOSIS — I951 Orthostatic hypotension: Secondary | ICD-10-CM | POA: Diagnosis present

## 2021-06-15 DIAGNOSIS — E669 Obesity, unspecified: Secondary | ICD-10-CM | POA: Diagnosis present

## 2021-06-15 DIAGNOSIS — Z20822 Contact with and (suspected) exposure to covid-19: Secondary | ICD-10-CM | POA: Diagnosis present

## 2021-06-15 DIAGNOSIS — I5032 Chronic diastolic (congestive) heart failure: Secondary | ICD-10-CM | POA: Diagnosis present

## 2021-06-15 DIAGNOSIS — E559 Vitamin D deficiency, unspecified: Secondary | ICD-10-CM | POA: Diagnosis present

## 2021-06-15 LAB — CBC
HCT: 43.8 % (ref 36.0–46.0)
Hemoglobin: 14.2 g/dL (ref 12.0–15.0)
MCH: 30.3 pg (ref 26.0–34.0)
MCHC: 32.4 g/dL (ref 30.0–36.0)
MCV: 93.6 fL (ref 80.0–100.0)
Platelets: 188 10*3/uL (ref 150–400)
RBC: 4.68 MIL/uL (ref 3.87–5.11)
RDW: 14.2 % (ref 11.5–15.5)
WBC: 6.2 10*3/uL (ref 4.0–10.5)
nRBC: 0 % (ref 0.0–0.2)

## 2021-06-15 LAB — BASIC METABOLIC PANEL
Anion gap: 8 (ref 5–15)
BUN: 19 mg/dL (ref 8–23)
CO2: 26 mmol/L (ref 22–32)
Calcium: 9 mg/dL (ref 8.9–10.3)
Chloride: 107 mmol/L (ref 98–111)
Creatinine, Ser: 0.56 mg/dL (ref 0.44–1.00)
GFR, Estimated: >60 mL/min (ref >60)
Glucose, Bld: 128 mg/dL — ABNORMAL HIGH (ref 70–99)
Potassium: 3.9 mmol/L (ref 3.5–5.1)
Sodium: 141 mmol/L (ref 135–145)

## 2021-06-15 LAB — THYROID PANEL WITH TSH
Free Thyroxine Index: 2.9 (ref 1.2–4.9)
T3 Uptake Ratio: 32 % (ref 24–39)
T4, Total: 9 ug/dL (ref 4.5–12.0)
TSH: 1.45 u[IU]/mL (ref 0.450–4.500)

## 2021-06-15 LAB — GLUCOSE, CAPILLARY
Glucose-Capillary: 197 mg/dL — ABNORMAL HIGH (ref 70–99)
Glucose-Capillary: 167 mg/dL — ABNORMAL HIGH (ref 70–99)
Glucose-Capillary: 189 mg/dL — ABNORMAL HIGH (ref 70–99)
Glucose-Capillary: 331 mg/dL — ABNORMAL HIGH (ref 70–99)

## 2021-06-15 LAB — PHOSPHORUS: Phosphorus: 3.4 mg/dL (ref 2.5–4.6)

## 2021-06-15 LAB — MAGNESIUM: Magnesium: 2.2 mg/dL (ref 1.7–2.4)

## 2021-06-15 MED ORDER — SODIUM CHLORIDE 0.9 % IV SOLN: INTRAVENOUS | Status: DC

## 2021-06-15 MED ORDER — DEXAMETHASONE SODIUM PHOSPHATE 10 MG/ML IJ SOLN
10.0000 mg | Freq: Once | INTRAMUSCULAR | Status: AC
  Administered 2021-06-15: 12:00:00 10 mg via INTRAVENOUS
  Filled 2021-06-15: qty 1

## 2021-06-15 MED ORDER — GADOBUTROL 1 MMOL/ML IV SOLN
9.0000 mL | Freq: Once | INTRAVENOUS | Status: AC | PRN
  Administered 2021-06-15: 14:00:00 9 mL via INTRAVENOUS

## 2021-06-15 MED ORDER — SODIUM CHLORIDE 0.9 % IV BOLUS
250.0000 mL | Freq: Once | INTRAVENOUS | Status: AC
  Administered 2021-06-15: 250 mL via INTRAVENOUS

## 2021-06-15 MED ORDER — MAGNESIUM SULFATE IN D5W 1-5 GM/100ML-% IV SOLN
1.0000 g | Freq: Once | INTRAVENOUS | Status: AC
  Administered 2021-06-15: 1 g via INTRAVENOUS
  Filled 2021-06-15: qty 100

## 2021-06-15 MED ORDER — BUTALBITAL-APAP-CAFFEINE 50-325-40 MG PO TABS
1.0000 | ORAL_TABLET | Freq: Four times a day (QID) | ORAL | Status: AC | PRN
  Administered 2021-06-15 – 2021-06-16 (×2): 1 via ORAL
  Filled 2021-06-15 (×2): qty 1

## 2021-06-15 NOTE — Progress Notes (Signed)
To whom it may concern:  The above named patient will require a short term nursing home stay -30 days or less for strengthening and rehabilitation.  Plan is for return home.

## 2021-06-15 NOTE — Progress Notes (Signed)
pt states gabapentin is supposed to be 800mg TID not 600mg . c/o pain 5/10 headache tylenol did not work. Notified MD Dwyane Dee

## 2021-06-15 NOTE — Progress Notes (Signed)
[facility]    SUBJECTIVE: The patient reports that she has not ambulated yet, but does have dizziness when lying in bed. She reports pleuritic chest pain, and pain to palpation of chest wall. She reports that her breathing has be fine.    Vitals:   06/15/21 0450 06/15/21 0458 06/15/21 0500 06/15/21 0503  BP:  138/67 130/67 127/64  Pulse:  (!) 55 (!) 53 (!) 59  Resp:  18    Temp:  97.8 F (36.6 C)    TempSrc:  Oral    SpO2:  95% 96% 99%  Weight: 87.2 kg        Intake/Output Summary (Last 24 hours) at 06/15/2021 0814 Last data filed at 06/15/2021 0010 Gross per 24 hour  Intake 720 ml  Output 1500 ml  Net -780 ml      PHYSICAL EXAM  General: Well developed, well nourished, in no acute distress, sitting up in bed eating breakfast, watching TV HEENT:  Normocephalic and atramatic Neck:  No JVD.  Lungs: Clear bilaterally to auscultation anteriorly  Heart: HRRR . Normal S1 and S2 without gallops or murmurs.  Msk:  no obvious deformities Extremities: No clubbing, cyanosis with minimal ankle edema.   Neuro: Alert and oriented X 3. Psych:  Good affect, responds appropriately   LABS: Basic Metabolic Panel: Recent Labs    06/14/21 0538 06/15/21 0536  NA 140 141  K 3.4* 3.9  CL 103 107  CO2 27 26  GLUCOSE 157* 128*  BUN 17 19  CREATININE 0.71 0.56  CALCIUM 9.0 9.0  MG 1.9 2.2  PHOS 3.8 3.4   Liver Function Tests: Recent Labs    06/14/21 0538  AST 29  ALT 23  ALKPHOS 84  BILITOT 0.8  PROT 6.3*  ALBUMIN 3.6   No results for input(s): LIPASE, AMYLASE in the last 72 hours. CBC: Recent Labs    06/14/21 0538 06/15/21 0536  WBC 8.2 6.2  HGB 14.7 14.2  HCT 44.3 43.8  MCV 92.5 93.6  PLT 205 188   Cardiac Enzymes: No results for input(s): CKTOTAL, CKMB, CKMBINDEX, TROPONINI in the last 72 hours. BNP: Invalid input(s): POCBNP D-Dimer: No results for input(s): DDIMER in the last 72 hours. Hemoglobin A1C: No results for input(s): HGBA1C in the last 72  hours. Fasting Lipid Panel: No results for input(s): CHOL, HDL, LDLCALC, TRIG, CHOLHDL, LDLDIRECT in the last 72 hours. Thyroid Function Tests: Recent Labs    06/14/21 1222  TSH 1.450  T4TOTAL 9.0   Anemia Panel: Recent Labs    06/14/21 0538 06/14/21 0850  VITAMINB12  --  233  FOLATE 13.7  --   TIBC 410  --   IRON 70  --     CT HEAD WO CONTRAST  Result Date: 06/13/2021 CLINICAL DATA:  Dizziness.  Multiple syncopal episodes. EXAM: CT HEAD WITHOUT CONTRAST TECHNIQUE: Contiguous axial images were obtained from the base of the skull through the vertex without intravenous contrast. COMPARISON:  05/14/2014.  Brain MR dated 11/13/2016. FINDINGS: Brain: Mild to moderate patchy white matter low density in both cerebral hemispheres without significant change since 2018. Normal size and position of the ventricles. No intracranial hemorrhage, mass lesion or CT evidence of acute infarction. Vascular: No hyperdense vessel or unexpected calcification. Skull: Normal. Negative for fracture or focal lesion. Sinuses/Orbits: Unremarkable. Other: None. IMPRESSION: 1. No acute abnormality. 2. Stable mild-to-moderate chronic small vessel white matter ischemic changes in both cerebral hemispheres. Electronically Signed   By: Percell Locus.D.  On: 06/13/2021 15:16   MR BRAIN WO CONTRAST  Result Date: 06/13/2021 CLINICAL DATA:  Initial evaluation for neuro deficit, stroke suspected. EXAM: MRI HEAD WITHOUT CONTRAST TECHNIQUE: Multiplanar, multiecho pulse sequences of the brain and surrounding structures were obtained without intravenous contrast. COMPARISON:  Prior head CT from earlier the same day as well as previous MRI from 11/13/2016. FINDINGS: Brain: Mild diffuse prominence of the CSF containing spaces compatible generalized age-related cerebral atrophy. Patchy and confluent T2/FLAIR hyperintensity within the periventricular and deep white matter both cerebral hemispheres most consistent with chronic small  vessel ischemic disease, overall moderate in nature. No abnormal foci of restricted diffusion to suggest acute or subacute ischemia. Gray-white matter differentiation maintained. No encephalomalacia to suggest chronic cortical infarction. No foci of susceptibility artifact to suggest acute or chronic intracranial hemorrhage. No mass lesion, midline shift or mass effect. No hydrocephalus or extra-axial fluid collection. Pituitary gland suprasellar region normal. Midline structures intact. Vascular: Major intracranial vascular flow voids are maintained. Skull and upper cervical spine: Craniocervical junction within normal limits. Bone marrow signal intensity normal. No scalp soft tissue abnormality. Sinuses/Orbits: Patient status post bilateral ocular lens replacement. Globes orbital soft tissues demonstrate no acute finding. Paranasal sinuses are largely clear. No significant mastoid effusion. Inner ear structures grossly normal. Other: None. IMPRESSION: 1. No acute intracranial abnormality. 2. Generalized age-related cerebral atrophy with moderate chronic microvascular ischemic disease. Electronically Signed   By: Jeannine Boga M.D.   On: 06/13/2021 20:48   US Carotid Bilateral  Result Date: 06/14/2021 CLINICAL DATA:  Syncope Diabetes Hypertension EXAM: BILATERAL CAROTID DUPLEX ULTRASOUND TECHNIQUE: Pearline Cables scale imaging, color Doppler and duplex ultrasound were performed of bilateral carotid and vertebral arteries in the neck. COMPARISON:  None. FINDINGS: Criteria: Quantification of carotid stenosis is based on velocity parameters that correlate the residual internal carotid diameter with NASCET-based stenosis levels, using the diameter of the distal internal carotid lumen as the denominator for stenosis measurement. The following velocity measurements were obtained: RIGHT ICA: 57/17 cm/sec CCA: 41/74 cm/sec SYSTOLIC ICA/CCA RATIO:  1.0 ECA: 62 cm/sec LEFT ICA: 54/19 cm/sec CCA: 08/14 cm/sec SYSTOLIC  ICA/CCA RATIO:  1.2 ECA: 79 cm/sec RIGHT CAROTID ARTERY: No significant atheromatous plaque of the carotid arteries. RIGHT VERTEBRAL ARTERY:  Antegrade flow. LEFT CAROTID ARTERY: No significant atheromatous plaque of the carotid arteries. LEFT VERTEBRAL ARTERY:  Antegrade flow. IMPRESSION: No significant stenosis of the internal carotid arteries. Electronically Signed   By: Miachel Roux M.D.   On: 06/14/2021 15:58   ECHOCARDIOGRAM COMPLETE  Result Date: 06/14/2021    ECHOCARDIOGRAM REPORT   Patient Name:   CACI ORREN Date of Exam: 06/14/2021 Medical Rec #:  [id number]      Height:       62.0 in Accession #:    [id number]     Weight:       195.1 lb Date of Birth:  1946-05-03      BSA:          1.892 m Patient Age:    75 years       BP:           111/63 mmHg Patient Gender: F              HR:           64 bpm. Exam Location:  [facility] Procedure: 2D Echo, Color Doppler and Cardiac Doppler Indications:     R55 Syncope  History:         Patient has no prior  history of Echocardiogram examinations.                  Risk Factors:Hypertension, Diabetes and Sleep Apnea.  Sonographer:     Charmayne Sheer Referring Phys:  [place] Diagnosing Phys: Isaias Cowman MD IMPRESSIONS  1. Left ventricular ejection fraction, by estimation, is 60 to 65%. The left ventricle has normal function. The left ventricle has no regional wall motion abnormalities. Left ventricular diastolic parameters were normal.  2. Right ventricular systolic function is normal. The right ventricular size is normal.  3. The mitral valve is normal in structure. Trivial mitral valve regurgitation. No evidence of mitral stenosis.  4. The aortic valve is normal in structure. Aortic valve regurgitation is not visualized. No aortic stenosis is present.  5. The inferior vena cava is normal in size with greater than 50% respiratory variability, suggesting right atrial pressure of 3 mmHg. FINDINGS  Left Ventricle: Left ventricular ejection fraction, by  estimation, is 60 to 65%. The left ventricle has normal function. The left ventricle has no regional wall motion abnormalities. The left ventricular internal cavity size was normal in size. There is  no left ventricular hypertrophy. Left ventricular diastolic parameters were normal. Right Ventricle: The right ventricular size is normal. No increase in right ventricular wall thickness. Right ventricular systolic function is normal. Left Atrium: Left atrial size was normal in size. Right Atrium: Right atrial size was normal in size. Pericardium: There is no evidence of pericardial effusion. Mitral Valve: The mitral valve is normal in structure. Trivial mitral valve regurgitation. No evidence of mitral valve stenosis. MV peak gradient, 2.6 mmHg. The mean mitral valve gradient is 1.0 mmHg. Tricuspid Valve: The tricuspid valve is normal in structure. Tricuspid valve regurgitation is trivial. No evidence of tricuspid stenosis. Aortic Valve: The aortic valve is normal in structure. Aortic valve regurgitation is not visualized. No aortic stenosis is present. Aortic valve mean gradient measures 4.0 mmHg. Aortic valve peak gradient measures 7.7 mmHg. Aortic valve area, by VTI measures 1.84 cm. Pulmonic Valve: The pulmonic valve was normal in structure. Pulmonic valve regurgitation is not visualized. No evidence of pulmonic stenosis. Aorta: The aortic root is normal in size and structure. Venous: The inferior vena cava is normal in size with greater than 50% respiratory variability, suggesting right atrial pressure of 3 mmHg. IAS/Shunts: No atrial level shunt detected by color flow Doppler.  LEFT VENTRICLE PLAX 2D LVIDd:         4.30 cm   Diastology LVIDs:         2.80 cm   LV e' medial:    8.81 cm/s LV PW:         0.90 cm   LV E/e' medial:  8.6 LV IVS:        0.80 cm   LV e' lateral:   11.70 cm/s LVOT diam:     1.70 cm   LV E/e' lateral: 6.4 LV SV:         56 LV SV Index:   29 LVOT Area:     2.27 cm  RIGHT VENTRICLE RV  Basal diam:  3.30 cm LEFT ATRIUM           Index        RIGHT ATRIUM           Index LA diam:      2.80 cm 1.48 cm/m   RA Area:     11.90 cm LA Vol (A4C): 48.2 ml 25.48 ml/m  RA Volume:   21.90 ml  11.58 ml/m  AORTIC VALVE                    PULMONIC VALVE AV Area (Vmax):    1.71 cm     PV Vmax:       1.00 m/s AV Area (Vmean):   1.79 cm     PV Vmean:      68.100 cm/s AV Area (VTI):     1.84 cm     PV VTI:        0.234 m AV Vmax:           139.00 cm/s  PV Peak grad:  4.0 mmHg AV Vmean:          94.700 cm/s  PV Mean grad:  2.0 mmHg AV VTI:            0.302 m AV Peak Grad:      7.7 mmHg AV Mean Grad:      4.0 mmHg LVOT Vmax:         105.00 cm/s LVOT Vmean:        74.600 cm/s LVOT VTI:          0.245 m LVOT/AV VTI ratio: 0.81  AORTA Ao Root diam: 2.30 cm MITRAL VALVE MV Area (PHT): 4.08 cm    SHUNTS MV Area VTI:   2.54 cm    Systemic VTI:  0.24 m MV Peak grad:  2.6 mmHg    Systemic Diam: 1.70 cm MV Mean grad:  1.0 mmHg MV Vmax:       0.80 m/s MV Vmean:      54.3 cm/s MV Decel Time: 186 msec MV E velocity: 75.40 cm/s MV A velocity: 69.00 cm/s MV E/A ratio:  1.09 Isaias Cowman MD Electronically signed by Isaias Cowman MD Signature Date/Time: 06/14/2021/3:00:54 PM    Final      Echo LVEF 60-65%, no RWMA, trivial valvular insufficiencies  TELEMETRY: sinus rhythm  ASSESSMENT AND PLAN:  Principal Problem:   Syncope and collapse Active Problems:   Hypothyroidism   Diabetes mellitus type II, non insulin dependent (HCC)   Anxiety state   Essential hypertension   CAD (coronary artery disease)   Sleep apnea   Chronic diastolic CHF (congestive heart failure) (HCC)   Hypokalemia    Recurrent syncope, with preceding dizziness and lightheadedness with pleuritic chest pain with a musculoskeletal component with negative troponin x 2. 2D echocardiogram reveals normal left ventricular function with no RWMA with no significant valvular abnormalities. Carotid ultrasound negative for significant  stenosis in bilateral ICA. No arrhythmias or pauses noted on telemetry. Sinus bradycardia noted during sleep. 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  Clabe Seal, PA-C 06/15/2021 8:14 AM

## 2021-06-15 NOTE — TOC Initial Note (Signed)
Transition of Care [facility]) - Initial/Assessment Note    Patient Details  Name: Teresa [name] MRN: [id number] Date of Birth: [birth year]  Transition of Care [facility]) CM/SW Contact:    Shelbie Hutching, RN Phone Number: 06/15/2021, 2:13 PM  Clinical Narrative:                 Patient now admitted to the hospital [place] syncope and collapse.  Patient has been very orthostatic, cardiology consulted.  PT and OT recommending SNF and patient agrees with short term rehab.  Patient would like to go to [facility] - her son works there.  RNCM met with patient at the bedside.  She is from home and independent in ADL's, she has walker.  Current with PCP and [facility] [place] will reach out to [facility] for bed request.    Expected Discharge Plan: [place] Barriers to Discharge: Continued Medical Work up   Patient Goals and CMS Choice Patient states their goals for this hospitalization and ongoing recovery are:: Patient wants to feel better and agrees with SNF CMS Medicare.gov Compare Post Acute Care list provided to:: Patient Choice offered to / list presented to : Patient  Expected Discharge Plan and Services Expected Discharge Plan: [place]   Discharge Planning Services: CM Consult Post Acute Care Choice: [place] Living arrangements for the past 2 months: Apartment                 DME Arranged: N/A DME Agency: NA       HH Arranged: NA HH Agency: NA        Prior Living Arrangements/Services Living arrangements for the past 2 months: Apartment Lives with:: Self Patient language and need for interpreter reviewed:: Yes Do you feel safe going back to the place where you live?: Yes      Need for Family Participation in Patient Care: Yes (Comment) Care giver support system in place?: Yes (comment) Current home services: DME (walker) Criminal Activity/Legal Involvement Pertinent to Current  Situation/Hospitalization: No - Comment as needed  Activities of Daily Living Home Assistive Devices/Equipment: CPAP ADL Screening (condition at time of admission) Patient's cognitive ability adequate to safely complete daily activities?: Yes Is the patient deaf or have difficulty hearing?: No Does the patient have difficulty seeing, even when wearing glasses/contacts?: No Does the patient have difficulty concentrating, remembering, or making decisions?: Yes Patient able to express need for assistance with ADLs?: Yes Does the patient have difficulty dressing or bathing?: No Independently performs ADLs?: Yes (appropriate for developmental age) Does the patient have difficulty walking or climbing stairs?: Yes Weakness of Legs: None Weakness of Arms/Hands: None  Permission Sought/Granted Permission sought to share information with : Case Manager, Family Supports, Customer service manager    Share Information with NAME: Babygirl Trager  Permission granted to share info w AGENCY: [place] granted to share info w Relationship: son  Permission granted to share info w Contact Information: [phone]  Emotional Assessment Appearance:: Appears stated age Attitude/Demeanor/Rapport: Engaged Affect (typically observed): Accepting Orientation: : Oriented to Self, Oriented to Place, Oriented to  Time, Oriented to Situation Alcohol / Substance Use: Not Applicable Psych Involvement: No (comment)  Admission diagnosis:  Syncope and collapse [R55] Dizziness [R42] Patient Active Problem List   Diagnosis Date Noted   Syncope and collapse 06/13/2021   Hypokalemia 06/13/2021   Oxygen desaturation 06/24/2019   Encounter for examination following treatment at  hospital 06/24/2019   History of 2019 novel coronavirus disease (COVID-19) 06/24/2019   Pneumonia due to COVID-19 virus 05/16/2019   Intractable nausea and vomiting 05/16/2019   Chronic diastolic CHF (congestive heart  failure) ([place]) 05/16/2019   Encounter for screening colonoscopy    Benign neoplasm of cecum    Diverticulosis of large intestine without diverticulitis    Tortuous colon    Polyp of sigmoid colon    Intestinal lump    Polyneuropathy associated with underlying disease ([place]) 12/11/2016   Microcalcifications of the breast 12/15/2015   Back pain, chronic 06/28/2015   Chronic airway obstruction ([place]) 06/28/2015   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   Essential hypertension 06/21/2009   CAD (coronary artery disease) 06/21/2009   FEVER UNSPECIFIED 06/21/2009   NAUSEA 06/21/2009   DYSPHAGIA 06/21/2009   RUQ PAIN 06/21/2009   HIATAL HERNIA 01/07/2008   Hypothyroidism 11/22/2007   Anxiety state 11/22/2007   DEPRESSION 11/22/2007   Hypertensive heart disease without heart failure 11/22/2007   ANGINA PECTORIS 11/22/2007   RENAL CALCULUS 11/22/2007   Osteoarthritis 11/22/2007   Sleep apnea 11/22/2007   HEADACHE, CHRONIC 11/22/2007   GASTRITIS 10/23/2007   DIVERTICULOSIS, COLON 10/23/2007   Diabetes mellitus type II, non insulin dependent ([place]) 07/16/2007   HYPERCHOLESTEROLEMIA 07/16/2007   OBESITY, MODERATE 07/16/2007   ISCHEMIC HEART DISEASE 07/16/2007   ESOPHAGEAL REFLUX 07/16/2007   SCLERODERMA 07/16/2007   DYSPNEA 07/16/2007   COUGH, CHRONIC 07/16/2007   PCP:  [name], MD Pharmacy:   [id number] [place], [place] - [id number]: [phone]: [phone]    Social Determinants of Health (SDOH) Interventions    Readmission Risk Interventions No flowsheet data found.

## 2021-06-15 NOTE — Care Management Obs Status (Signed)
[place] NOTIFICATION   Patient Details  Name: Teresa [name] MRN: [id number] Date of Birth: 03-09-1946   Medicare Observation Status Notification Given:  Yes    Shelbie Hutching, RN 06/15/2021, 12:02 PM

## 2021-06-15 NOTE — Progress Notes (Signed)
[facility] Progress Note  Patient: Teresa [name]    GLO:[id number]  DOA: 06/13/2021     Date of Service: the patient was seen and examined on 06/15/2021  Chief Complaint  Patient presents with   Near Syncope   Brief hospital course: Teresa [name] is a 75 y.o. female with medical history significant of coronary artery disease, depression, GERD, nonspecific heart murmur, hypothyroidism, essential hypertension, scleroderma, obstructive sleep apnea, who has been having recurrent syncopal episodes she had 2 episodes on Saturday 2 episodes on Sunday and one episode today.  It usually happens suddenly with no dizziness.  No prior episodes.  No fever or chills.  She is a diabetic with known coronary artery disease.  No recent change in her medications.  Denied any prodrome.  Patient is being admitted for syncopal work-up..   ED Course: Temperature 98.6 blood pressure 140/118, pulse 92, respirate 20 and oxygen sat 95% room air.  Sodium 139 potassium 3.4 chloride 100 CO2 28 glucose 146.  BUN 19 creatinine 0.99 calcium 9.2.  CBC all within normal.  Urinalysis negative.  Influenza and COVID-19 negative.  Head CT without contrast is negative.  MRI of the brain ordered.  Patient being admitted for further evaluation and treatment.      Assessment and Plan: Principal Problem:   Syncope and collapse Active Problems:   Hypothyroidism   Diabetes mellitus type II, non insulin dependent (HCC)   Anxiety state   Essential hypertension   CAD (coronary artery disease)   Sleep apnea   Chronic diastolic CHF (congestive heart failure) (HCC)   Hypokalemia       # Syncope and collapse: Probably cardiac versus neurologic.   Continue to monitor on telemetry Blood glucose seems within normal range, orthostatics were negative which will be repeated, RN was advised to repeat orthostatics every 4 hourly Troponin negative, BNP within normal range, negative orthostatic hypotension  Potassium level 3.4,  repleted, mag 1.9 CT head no acute findings, MRI brain no acute findings 2D echocardiogram LVEF 65%, no significant findings thyroid panel within normal range Continue fall precaution Cardiology consult appreciated, recommended musculoskeletal chest pain, needs Holter monitor for 2 weeks as an outpatient 11/9 patient was complaining of migraine headaches and was feeling headache during syncopal episodes, which might be contributing to syncope. Patient was given NS 250 bolus, Decadron 10 mg IV x1 dose and magnesium 1 g IV 1 dose Started Fioricet as needed for migraine headaches Neurology consulted, ordered MRA head and neck     # hypokalemia: Replete potassium.   # chronic diastolic heart failure: Appears compensated.  Continue home regimen   # diabetes: Non-insulin-dependent.  Continue sliding scale insulin   # coronary artery disease: No significant decompensation.  Continue supportive care and monitoring.   # obstructive sleep apnea: CPAP at night.   # hypothyroidism: Check thyroid panel.  Continue levothyroxine.   # anxiety disorder: Stable.  Not on active treatment.  Continue monitoring.   # gout: Continue allopurinol and colchicine  # Vitamin D deficiency, started vitamin D 53 units p.o. weekly, follow with PCP to repeat vitamin D level after 3 months.  Body mass index is 35.69 kg/m.  Interventions:       Diet: Heart healthy/carb modified DVT Prophylaxis: Subcutaneous Lovenox   Advance goals of care discussion: Full code  Family Communication: family was not present at bedside, at the time of interview.  The pt provided permission to discuss medical plan with the family. Opportunity was given to  ask question and all questions were answered satisfactorily.   Disposition:  Pt is from Home, admitted with recurrent syncopal episodes, still has syncope, high risk of fall and readmission, pending 2D echocardiogram and cardiology consult, which precludes a safe  discharge. Discharge to home, when stable and cleared by cardiology..  Subjective: No significant events overnight, patient was complaining of having migraine headaches.  Today patient mentioned that she has been having migraine headaches during syncopal episodes, which she did not mention before, we will try migraine cocktail today and consult neurology as discussed above. RN was advised to ambulate patient in the hallway when stable     Physical Exam: General:  alert oriented to time, place, and person.  Appear in no distress, affect appropriate Eyes: PERRLA ENT: Oral Mucosa Clear, moist  Neck:  no JVD,  Cardiovascular: S1 and S2 Present, no Murmur,  Respiratory: good respiratory effort, Bilateral Air entry equal and Decreased, no Crackles, no wheezes Abdomen: Bowel Sound present, Soft and no tenderness,  Skin: no rashes Extremities: no Pedal edema, no calf tenderness Neurologic: without any new focal findings Gait not checked due to patient safety concerns  Vitals:   06/15/21 0944 06/15/21 1142 06/15/21 1144 06/15/21 1146  BP:  98/86 115/75 135/74  Pulse:  63 (!) 53 62  Resp:  18    Temp: 98.7 F (37.1 C) 97.9 F (36.6 C)    TempSrc: Oral Oral    SpO2: 99% 96%    Weight:        Intake/Output Summary (Last 24 hours) at 06/15/2021 1450 Last data filed at 06/15/2021 1445 Gross per 24 hour  Intake 240 ml  Output 2500 ml  Net -2260 ml   Filed Weights   06/13/21 1443 06/15/21 0450  Weight: 88.5 kg 87.2 kg    Data Reviewed: I have personally reviewed and interpreted daily labs, tele strips, imagings as discussed above. I reviewed all nursing notes, pharmacy notes, vitals, pertinent old records I have discussed plan of care as described above with RN and patient/family.  CBC: Recent Labs  Lab 06/13/21 1440 06/13/21 2135 06/14/21 0538 06/15/21 0536  WBC 9.7 7.8 8.2 6.2  HGB 15.6* 14.3 14.7 14.2  HCT 47.6* 44.1 44.3 43.8  MCV 91.9 92.1 92.5 93.6  PLT 238 207 205  536   Basic Metabolic Panel: Recent Labs  Lab 06/13/21 1440 06/13/21 2135 06/14/21 0538 06/15/21 0536  NA 139  --  140 141  K 3.4*  --  3.4* 3.9  CL 100  --  103 107  CO2 28  --  27 26  GLUCOSE 146*  --  157* 128*  BUN 19  --  17 19  CREATININE 0.91 0.60 0.71 0.56  CALCIUM 9.2  --  9.0 9.0  MG  --   --  1.9 2.2  PHOS  --   --  3.8 3.4    Studies: MR ANGIO HEAD WO CONTRAST  Result Date: 06/15/2021 CLINICAL DATA:  Neuro deficit, acute, stroke suspected; Carotid artery stenosis EXAM: MRA NECK WITHOUT AND WITH CONTRAST MRA HEAD WITHOUT CONTRAST TECHNIQUE: Multiplanar and multiecho pulse sequences of the neck were obtained without and with intravenous contrast. Angiographic images of the neck were obtained using MRA technique without and with intravenous contrast; Angiographic images of the Circle of Willis were obtained using MRA technique without intravenous contrast. CONTRAST:  28mL GADAVIST GADOBUTROL 1 MMOL/ML IV SOLN COMPARISON:  None. FINDINGS: MRA NECK FINDINGS Common, internal, and external carotid arteries are patent. Codominant  vertebral arteries are patent. No hemodynamically significant stenosis or evidence of dissection. MRA HEAD FINDINGS Intracranial internal carotid arteries are patent. Anterior and middle cerebral arteries are patent. Intracranial vertebral arteries are patent. Basilar artery is patent. Posterior cerebral arteries are patent. Bilateral posterior communicating arteries are present. There is no significant stenosis or aneurysm. IMPRESSION: No large vessel occlusion.  No significant stenosis. Electronically Signed   By: Macy Mis M.D.   On: 06/15/2021 14:32   MR ANGIO NECK W WO CONTRAST  Result Date: 06/15/2021 CLINICAL DATA:  Neuro deficit, acute, stroke suspected; Carotid artery stenosis EXAM: MRA NECK WITHOUT AND WITH CONTRAST MRA HEAD WITHOUT CONTRAST TECHNIQUE: Multiplanar and multiecho pulse sequences of the neck were obtained without and with  intravenous contrast. Angiographic images of the neck were obtained using MRA technique without and with intravenous contrast; Angiographic images of the Circle of Willis were obtained using MRA technique without intravenous contrast. CONTRAST:  82mL GADAVIST GADOBUTROL 1 MMOL/ML IV SOLN COMPARISON:  None. FINDINGS: MRA NECK FINDINGS Common, internal, and external carotid arteries are patent. Codominant vertebral arteries are patent. No hemodynamically significant stenosis or evidence of dissection. MRA HEAD FINDINGS Intracranial internal carotid arteries are patent. Anterior and middle cerebral arteries are patent. Intracranial vertebral arteries are patent. Basilar artery is patent. Posterior cerebral arteries are patent. Bilateral posterior communicating arteries are present. There is no significant stenosis or aneurysm. IMPRESSION: No large vessel occlusion.  No significant stenosis. Electronically Signed   By: Macy Mis M.D.   On: 06/15/2021 14:32   US Carotid Bilateral  Result Date: 06/14/2021 CLINICAL DATA:  Syncope Diabetes Hypertension EXAM: BILATERAL CAROTID DUPLEX ULTRASOUND TECHNIQUE: Pearline Cables scale imaging, color Doppler and duplex ultrasound were performed of bilateral carotid and vertebral arteries in the neck. COMPARISON:  None. FINDINGS: Criteria: Quantification of carotid stenosis is based on velocity parameters that correlate the residual internal carotid diameter with NASCET-based stenosis levels, using the diameter of the distal internal carotid lumen as the denominator for stenosis measurement. The following velocity measurements were obtained: RIGHT ICA: 57/17 cm/sec CCA: 07/37 cm/sec SYSTOLIC ICA/CCA RATIO:  1.0 ECA: 62 cm/sec LEFT ICA: 54/19 cm/sec CCA: 10/62 cm/sec SYSTOLIC ICA/CCA RATIO:  1.2 ECA: 79 cm/sec RIGHT CAROTID ARTERY: No significant atheromatous plaque of the carotid arteries. RIGHT VERTEBRAL ARTERY:  Antegrade flow. LEFT CAROTID ARTERY: No significant atheromatous plaque of  the carotid arteries. LEFT VERTEBRAL ARTERY:  Antegrade flow. IMPRESSION: No significant stenosis of the internal carotid arteries. Electronically Signed   By: Miachel Roux M.D.   On: 06/14/2021 15:58    Scheduled Meds:  allopurinol  100 mg Oral Daily   aspirin EC  81 mg Oral Daily   atorvastatin  20 mg Oral Daily   enoxaparin (LOVENOX) injection  0.5 mg/kg Subcutaneous Q24H   gabapentin  600 mg Oral TID   glimepiride  4 mg Oral BID   insulin aspart  0-15 Units Subcutaneous TID WC   insulin aspart  0-5 Units Subcutaneous QHS   levothyroxine  88 mcg Oral Q0600   mycophenolate  250 mg Oral Q12H   sodium chloride flush  3 mL Intravenous Q12H   sulfamethoxazole-trimethoprim  1 tablet Oral Once per day on Mon Wed Fri   Vitamin D (Ergocalciferol)  50,000 Units Oral Q7 days   Continuous Infusions: PRN Meds: acetaminophen **OR** acetaminophen, albuterol, butalbital-acetaminophen-caffeine, colchicine, ondansetron **OR** ondansetron (ZOFRAN) IV  Time spent: 35 minutes  Author: Val Riles. MD [facility] 06/15/2021 2:50 PM  To reach On-call, see care teams  to locate the attending and reach out to them via [email or web address]. If 7PM-7AM, please contact night-coverage If you still have difficulty reaching the attending provider, please page the [facility] (Director on Call) for [facility] on amion for assistance.

## 2021-06-15 NOTE — Evaluation (Signed)
Physical Therapy Evaluation Patient Details Name: Teresa [name] MRN: [id number] DOB: 08-Apr-1946 Today's Date: 06/15/2021  History of Present Illness  75 y.o. female with medical history significant of coronary artery disease, depression, GERD, nonspecific heart murmur, hypothyroidism, essential hypertension, scleroderma, obstructive sleep apnea, who has been having recurrent syncopal episodes she had 2 episodes on Saturday 2 episodes on Sunday and one episode today.  It usually happens suddenly with no dizziness.  No prior episodes.  No fever or chills.  She is a diabetic with known coronary artery disease  Clinical Impression  Pt alert, oriented x4, pleasant and cooperative throughout. Prior to admission pt notes indep w/ functional mobility and currently works as Therapist, sports. Pt does not have support if returning home and indicates significant fall history over last 2-3 wks (daily falls). Pt's primary limitations are orthostatics (see below) with associated headache and lightheadedness. Pt denies changes in medication. Due to symptoms, she presents with instability, reoccurring falls, and change from PLOF to now requiring physical assist for basic mobility tasks. Currently pt is unsafe to d/c home and requires assistance for bed mobility, transfers, and gait making SNF primary discharge recommendation. Skilled PT intervention is indicated to address deficits in function, mobility, and to return to PLOF as able.    Orthostatics: (supine) 135/70,62, 96% (sit) 127/74, 59, 98% (standing) 113/58, 60,98% (stand + 37min) 98/66, 63, 98%     Recommendations for follow up therapy are one component of a multi-disciplinary discharge planning process, led by the attending physician.  Recommendations may be updated based on patient status, additional functional criteria and insurance authorization.  Follow Up Recommendations Skilled nursing-short term rehab (<3 hours/day)    Assistance Recommended at Discharge  Intermittent Supervision/Assistance  Functional Status Assessment Patient has had a recent decline in their functional status and demonstrates the ability to make significant improvements in function in a reasonable and predictable amount of time.  Equipment Recommendations  None recommended by PT    Recommendations for Other Services       Precautions / Restrictions Precautions Precautions: Fall Precaution Comments: High Restrictions Weight Bearing Restrictions: No      Mobility  Bed Mobility Overal bed mobility: Needs Assistance Bed Mobility: Supine to Sit     Supine to sit: Mod assist     General bed mobility comments: able to manage BLE w/ cues, able to get trunk 50% and then falls back to bed despite cues, physical assist for trunk needed    Transfers Overall transfer level: Needs assistance Equipment used: Rolling walker (2 wheels) Transfers: Sit to/from Stand;Bed to chair/wheelchair/BSC Sit to Stand: Min assist   Step pivot transfers: Min guard       General transfer comment: unsteady standing requiring light min-a 2/2 dizziness and physical assist for momentum    Ambulation/Gait               General Gait Details: Deferred secondary to orthostasis w/ symptoms  Stairs            Wheelchair Mobility    Modified Rankin (Stroke Patients Only)       Balance Overall balance assessment: Needs assistance Sitting-balance support: Feet supported;Bilateral upper extremity supported Sitting balance-Leahy Scale: Fair Sitting balance - Comments: prefers BUE support but able to raise without posterior lean   Standing balance support: Bilateral upper extremity supported Standing balance-Leahy Scale: Poor Standing balance comment: requires BUE support  Pertinent Vitals/Pain Pain Assessment: 0-10 Pain Score: 8  Pain Location: headache Pain Descriptors / Indicators:  Aching;Grimacing;Discomfort;Squeezing;Tightness Pain Intervention(s): Limited activity within patient's tolerance;Monitored during session;Repositioned    Home Living Family/patient expects to be discharged to:: Private residence Living Arrangements: Alone Available Help at Discharge: Neighbor;Available PRN/intermittently Type of Home: Other(Comment) (townhome) Home Access: Stairs to enter Entrance Stairs-Rails: None   Alternate Level Stairs-Number of Steps: 14 Home Layout: Two level;Bed/bath upstairs Home Equipment: Conservation officer, nature (2 wheels)      Prior Function               Mobility Comments: Pt reports daily falls 2/2 dizziness w/o head trauma; independent with mobility ADLs Comments: Pt indep w/ ADLs, currentlyl works as Therapist, 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     [employer]   Dominant Hand: Right    Extremity/Trunk Assessment   Upper Extremity Assessment Upper Extremity Assessment: Generalized weakness RUE Deficits / Details: tremors w/ upright sitting, limited AROM above ~ 70 degrees; decreased cervical rotation bilaterally    Lower Extremity Assessment Lower Extremity Assessment: Generalized weakness (4/5 MMT knee extension, flexion; SILT BLE)       Communication   Communication: No difficulties  Cognition Arousal/Alertness: Awake/alert Behavior During Therapy: WFL for tasks assessed/performed Overall Cognitive Status: Within Functional Limits for tasks assessed                                 General Comments: AOx4        General Comments General comments (skin integrity, edema, etc.): Bruising L forearm    Exercises Other Exercises Other Exercises: Orthostatics assessed: (supine) 135/70,62, 96% (sit) 127/74, 59, 98% (standing) 113/58, 60,98% (stand + 44min) 98/66, 63, 98%   Assessment/Plan    PT Assessment Patient needs continued PT services  PT Problem List Decreased strength;Decreased mobility;Decreased range of motion;Decreased  coordination;Decreased activity tolerance;Decreased balance       PT Treatment Interventions Therapeutic activities;Gait training;Therapeutic exercise;Stair training;Balance training;Functional mobility training    PT Goals (Current goals can be found in the Care Plan section)  Acute Rehab PT Goals Patient Stated Goal: To go home PT Goal Formulation: With patient Time For Goal Achievement: 06/29/21 Potential to Achieve Goals: Good    Frequency Min 2X/week   Barriers to discharge        Co-evaluation               AM-PAC PT "6 Clicks" Mobility  Outcome Measure Help needed turning from your back to your side while in a flat bed without using bedrails?: A Little Help needed moving from lying on your back to sitting on the side of a flat bed without using bedrails?: A Lot Help needed moving to and from a bed to a chair (including a wheelchair)?: A Little Help needed standing up from a chair using your arms (e.g., wheelchair or bedside chair)?: A Little Help needed to walk in hospital room?: A Little Help needed climbing 3-5 steps with a railing? : A Lot 6 Click Score: 16    End of Session Equipment Utilized During Treatment: Gait belt Activity Tolerance: Treatment limited secondary to medical complications (Comment) (orthostatics) Patient left: in chair;with call bell/phone within reach;with chair alarm set Nurse Communication: Mobility status PT Visit Diagnosis: Unsteadiness on feet (R26.81);Muscle weakness (generalized) (M62.81);Repeated falls (R29.6);History of falling (Z91.81)    Time: 8756-4332 PT Time Calculation (min) (ACUTE ONLY): 35 min   Charges:  [employer], SPT

## 2021-06-15 NOTE — NC FL2 (Signed)
[place] LEVEL OF CARE SCREENING TOOL     IDENTIFICATION  Patient Name: Teresa [name] Birthdate: [birth year] Sex: female Admission Date (Current Location): 06/13/2021  [place] and [place] Number:  Engineering geologist and Address:  [facility], [address], [place], [place] [address]      Provider Number: [id number]  Attending Physician Name and Address:  Val Riles, MD  Relative Name and Phone Number:  Loghan Subia (son) [phone]    Current Level of Care: Hospital Recommended Level of Care: [place] Prior Approval Number:    Date Approved/Denied:   PASRR Number: pending  Discharge Plan: SNF    Current Diagnoses: Patient Active Problem List   Diagnosis Date Noted   Syncope and collapse 06/13/2021   Hypokalemia 06/13/2021   Oxygen desaturation 06/24/2019   Encounter for examination following treatment at hospital 06/24/2019   History of 2019 novel coronavirus disease (COVID-19) 06/24/2019   Pneumonia due to COVID-19 virus 05/16/2019   Intractable nausea and vomiting 05/16/2019   Chronic diastolic CHF (congestive heart failure) ([place]) 05/16/2019   Encounter for screening colonoscopy    Benign neoplasm of cecum    Diverticulosis of large intestine without diverticulitis    Tortuous colon    Polyp of sigmoid colon    Intestinal lump    Polyneuropathy associated with underlying disease ([place]) 12/11/2016   Microcalcifications of the breast 12/15/2015   Back pain, chronic 06/28/2015   Chronic airway obstruction ([place]) 06/28/2015   Panlobular emphysema ([place]) 09/10/2014   Essential hypertension 06/21/2009   CAD (coronary artery disease) 06/21/2009   FEVER UNSPECIFIED 06/21/2009   NAUSEA 06/21/2009   DYSPHAGIA 06/21/2009   RUQ PAIN 06/21/2009   HIATAL HERNIA 01/07/2008   Hypothyroidism 11/22/2007   Anxiety state 11/22/2007   DEPRESSION 11/22/2007   Hypertensive heart disease without heart failure  11/22/2007   ANGINA PECTORIS 11/22/2007   RENAL CALCULUS 11/22/2007   Osteoarthritis 11/22/2007   Sleep apnea 11/22/2007   HEADACHE, CHRONIC 11/22/2007   GASTRITIS 10/23/2007   DIVERTICULOSIS, COLON 10/23/2007   Diabetes mellitus type II, non insulin dependent ([place]) 07/16/2007   HYPERCHOLESTEROLEMIA 07/16/2007   OBESITY, MODERATE 07/16/2007   ISCHEMIC HEART DISEASE 07/16/2007   ESOPHAGEAL REFLUX 07/16/2007   SCLERODERMA 07/16/2007   DYSPNEA 07/16/2007   COUGH, CHRONIC 07/16/2007    Orientation RESPIRATION BLADDER Height & Weight     Self, Time, Situation, Place  Normal Continent Weight: 87.2 kg Height:     BEHAVIORAL SYMPTOMS/MOOD NEUROLOGICAL BOWEL NUTRITION STATUS      Continent Diet (see discharge summary)  AMBULATORY STATUS COMMUNICATION OF NEEDS Skin   Extensive Assist Verbally Normal                       Personal Care Assistance Level of Assistance  Bathing, Feeding, Dressing Bathing Assistance: Limited assistance Feeding assistance: Independent Dressing Assistance: Limited assistance     Functional Limitations Info  Sight, Hearing, Speech Sight Info: Adequate Hearing Info: Adequate Speech Info: Adequate    SPECIAL CARE FACTORS FREQUENCY  PT (By licensed PT), OT (By licensed OT)     PT Frequency: 5 times per week OT Frequency: 5 times per week            Contractures Contractures Info: Not present    Additional Factors Info  Code Status, Allergies Code Status Info: Full Allergies Info: Bacitracin-neomycin-polymyxin Not Specified    Clarithromycin Not Specified Other (See Comments), Nausea Only, Nausea And Vomiting  Codeine Not Specified  Other reaction(s): Hallucination  Dilaudid  (hydromorphone Hcl) Not Specified Nausea And Vomiting   Iodine Not Specified    Iohexol Not Specified   Desc: PT HAS HIVES/ITCHING  Neomycin-bacitracin Zn-polymyx Not Specified    Tamiflu  (oseltamivir Phosphate) Not Specified  Other reaction(s): Abdominal Pain,  Vomiting  Zolpidem Not Specified Nausea And Vomiting, Other (See Comments) Hallucinations  Benzalkonium Chloride Low Itching, Rash, Swelling   Lidocaine Hcl Low Itching, Rash, Swelling   Morphine Low Nausea Only, Rash, Nausea And Vomiting   Tape Low Itching, Rash Adhesive tape - silicone  Tapentadol Low Rash           Current Medications (06/15/2021):  This is the current hospital active medication list Current Facility-Administered Medications  Medication Dose Route Frequency Provider Last Rate Last Admin   acetaminophen (TYLENOL) tablet 650 mg  650 mg Oral Q6H PRN Elwyn Reach, MD   650 mg at 06/15/21 0636   Or   acetaminophen (TYLENOL) suppository 650 mg  650 mg Rectal Q6H PRN Elwyn Reach, MD       albuterol (PROVENTIL) (2.5 MG/3ML) 0.083% nebulizer solution 2.5 mg  2.5 mg Inhalation Q4H PRN Elwyn Reach, MD       allopurinol (ZYLOPRIM) tablet 100 mg  100 mg Oral Daily Gala Romney L, MD   100 mg at 06/15/21 0934   aspirin EC tablet 81 mg  81 mg Oral Daily Clabe Seal, PA-C   81 mg at 06/15/21 0932   atorvastatin (LIPITOR) tablet 20 mg  20 mg Oral Daily Elwyn Reach, MD   20 mg at 06/15/21 0934   butalbital-acetaminophen-caffeine (FIORICET) 50-325-40 MG per tablet 1 tablet  1 tablet Oral Q6H PRN Val Riles, MD   1 tablet at 06/15/21 1436   colchicine tablet 0.6 mg  0.6 mg Oral Daily PRN Elwyn Reach, MD       enoxaparin (LOVENOX) injection 45 mg  0.5 mg/kg Subcutaneous Q24H Gala Romney L, MD   45 mg at 06/14/21 2041   gabapentin (NEURONTIN) capsule 600 mg  600 mg Oral TID Val Riles, MD   600 mg at 06/15/21 1436   glimepiride (AMARYL) tablet 4 mg  4 mg Oral BID Gala Romney L, MD   4 mg at 06/15/21 0939   insulin aspart (novoLOG) injection 0-15 Units  0-15 Units Subcutaneous TID WC Gala Romney L, MD   3 Units at 06/15/21 1214   insulin aspart (novoLOG) injection 0-5 Units  0-5 Units Subcutaneous QHS Gala Romney L, MD       levothyroxine  (SYNTHROID) tablet 88 mcg  88 mcg Oral Q0600 Elwyn Reach, MD   88 mcg at 06/15/21 8588   mycophenolate (CELLCEPT) capsule 250 mg  250 mg Oral Q12H Val Riles, MD   250 mg at 06/15/21 0933   ondansetron (ZOFRAN) tablet 4 mg  4 mg Oral Q6H PRN Elwyn Reach, MD       Or   ondansetron (ZOFRAN) injection 4 mg  4 mg Intravenous Q6H PRN Gala Romney L, MD       sodium chloride flush (NS) 0.9 % injection 3 mL  3 mL Intravenous Q12H Gala Romney L, MD   3 mL at 06/15/21 1206   sulfamethoxazole-trimethoprim (BACTRIM) 400-80 MG per tablet 1 tablet  1 tablet Oral Once per day on Mon Wed Fri Val Riles, MD   1 tablet at 06/15/21 5027   Vitamin D (Ergocalciferol) (DRISDOL) capsule 50,000 Units  50,000 Units Oral Q7 days Val Riles, MD   50,000 Units at 06/14/21 1232     Discharge Medications: Please see discharge summary for a list of discharge medications.  Relevant Imaging Results:  Relevant Lab Results:   Additional Information SS# [id number]  Shelbie Hutching, RN

## 2021-06-16 ENCOUNTER — Inpatient Hospital Stay: Payer: PPO

## 2021-06-16 LAB — BASIC METABOLIC PANEL
Anion gap: 7 (ref 5–15)
BUN: 16 mg/dL (ref 8–23)
CO2: 23 mmol/L (ref 22–32)
Calcium: 9.2 mg/dL (ref 8.9–10.3)
Chloride: 110 mmol/L (ref 98–111)
Creatinine, Ser: 0.55 mg/dL (ref 0.44–1.00)
GFR, Estimated: >60 mL/min (ref >60)
Glucose, Bld: 192 mg/dL — ABNORMAL HIGH (ref 70–99)
Potassium: 3.9 mmol/L (ref 3.5–5.1)
Sodium: 140 mmol/L (ref 135–145)

## 2021-06-16 LAB — GLUCOSE, CAPILLARY
Glucose-Capillary: 206 mg/dL — ABNORMAL HIGH (ref 70–99)
Glucose-Capillary: 159 mg/dL — ABNORMAL HIGH (ref 70–99)
Glucose-Capillary: 231 mg/dL — ABNORMAL HIGH (ref 70–99)
Glucose-Capillary: 185 mg/dL — ABNORMAL HIGH (ref 70–99)
Glucose-Capillary: 179 mg/dL — ABNORMAL HIGH (ref 70–99)

## 2021-06-16 LAB — MAGNESIUM: Magnesium: 1.9 mg/dL (ref 1.7–2.4)

## 2021-06-16 LAB — CBC
HCT: 42.5 % (ref 36.0–46.0)
Hemoglobin: 14.1 g/dL (ref 12.0–15.0)
MCH: 30.3 pg (ref 26.0–34.0)
MCHC: 33.2 g/dL (ref 30.0–36.0)
MCV: 91.4 fL (ref 80.0–100.0)
Platelets: 219 10*3/uL (ref 150–400)
RBC: 4.65 MIL/uL (ref 3.87–5.11)
RDW: 14 % (ref 11.5–15.5)
WBC: 13.7 10*3/uL — ABNORMAL HIGH (ref 4.0–10.5)
nRBC: 0 % (ref 0.0–0.2)

## 2021-06-16 LAB — PHOSPHORUS: Phosphorus: 2.7 mg/dL (ref 2.5–4.6)

## 2021-06-16 MED ORDER — CYANOCOBALAMIN 1000 MCG/ML IJ SOLN
1000.0000 ug | Freq: Every day | INTRAMUSCULAR | Status: AC
  Administered 2021-06-16 – 2021-06-20 (×5): 1000 ug via INTRAMUSCULAR
  Filled 2021-06-16 (×5): qty 1

## 2021-06-16 MED ORDER — BUTALBITAL-APAP-CAFFEINE 50-325-40 MG PO TABS
1.0000 | ORAL_TABLET | Freq: Four times a day (QID) | ORAL | Status: DC | PRN
  Administered 2021-06-16 – 2021-06-17 (×2): 1 via ORAL
  Filled 2021-06-16 (×2): qty 1

## 2021-06-16 MED ORDER — SODIUM CHLORIDE 0.9 % IV SOLN
INTRAVENOUS | Status: DC
Start: 2021-06-16 — End: 2021-06-18

## 2021-06-16 MED ORDER — CYANOCOBALAMIN 500 MCG PO TABS: 500.0000 ug | ORAL_TABLET | Freq: Every day | ORAL | Status: DC

## 2021-06-16 NOTE — Progress Notes (Signed)
OT Cancellation Note  Patient Details Name: Teresa [name] MRN: [id number] DOB: 04/30/1946   Cancelled Treatment:    Reason Eval/Treat Not Completed: Patient at procedure or test/ unavailable. Pt off the unit for TEE. OT will re-attempt when pt is next available.   Darleen Crocker, MS, OTR/L , CBIS ascom [phone]  06/16/21, 1:50 PM

## 2021-06-16 NOTE — Progress Notes (Signed)
Eeg done 

## 2021-06-16 NOTE — Progress Notes (Signed)
[facility] Progress Note  Patient: Teresa [name]    BSJ:[id number]  DOA: 06/13/2021     Date of Service: the patient was seen and examined on 06/16/2021  Chief Complaint  Patient presents with   Near Syncope   Brief hospital course: Teresa [name] is a 75 y.o. female with medical history significant of coronary artery disease, depression, GERD, nonspecific heart murmur, hypothyroidism, essential hypertension, scleroderma, obstructive sleep apnea, who has been having recurrent syncopal episodes she had 2 episodes on Saturday 2 episodes on Sunday and one episode today.  It usually happens suddenly with no dizziness.  No prior episodes.  No fever or chills.  She is a diabetic with known coronary artery disease.  No recent change in her medications.  Denied any prodrome.  Patient is being admitted for syncopal work-up..   ED Course: Temperature 98.6 blood pressure 140/118, pulse 92, respirate 20 and oxygen sat 95% room air.  Sodium 139 potassium 3.4 chloride 100 CO2 28 glucose 146.  BUN 19 creatinine 0.99 calcium 9.2.  CBC all within normal.  Urinalysis negative.  Influenza and COVID-19 negative.  Head CT without contrast is negative.  MRI of the brain ordered.  Patient being admitted for further evaluation and treatment.      Assessment and Plan: Principal Problem:   Syncope and collapse Active Problems:   Hypothyroidism   Diabetes mellitus type II, non insulin dependent (HCC)   Anxiety state   Essential hypertension   CAD (coronary artery disease)   Sleep apnea   Chronic diastolic CHF (congestive heart failure) (HCC)   Hypokalemia       # Syncope and collapse: Probably cardiac versus neurologic.  Could be orthostatic hypotension Continue to monitor on telemetry Blood glucose seems within normal range,  11/9 orthostatic hypotension, patient had fluctuating blood pressure while standing. roponin negative, BNP within normal range, negative orthostatic hypotension  Potassium  level 3.4, repleted, mag 1.9 CT head no acute findings, MRI brain no acute findings 2D echocardiogram LVEF 65%, no significant findings thyroid panel within normal range Continue fall precaution Cardiology consult appreciated, recommended musculoskeletal chest pain, needs Holter monitor for 2 weeks as an outpatient 11/9 patient was complaining of migraine headaches and was feeling headache during syncopal episodes, which might be contributing to syncope. 11/9 Patient was given NS 250 bolus, Decadron 10 mg IV x1 dose and magnesium 1 g IV 1 dose Started Fioricet as needed for migraine headaches Neurology consulted, MRA head and neck negative for any significant findings. 11/10 Neurology recommended MRI brain with contrast and EEG Today orthostatic hypotension resolved after IV fluid given, we will continue maintenance IV fluid for hydration NS 50 mill per hour, patient was advised to move slowly and hold onto something due to orthostatic hypotension. Patient was requesting Fioricet as she was still complaining of headache    # hypokalemia: Replete potassium.   # chronic diastolic heart failure: Appears compensated.  Continue home regimen   # diabetes: Non-insulin-dependent.  Continue sliding scale insulin   # coronary artery disease: No significant decompensation.  Continue supportive care and monitoring.   # obstructive sleep apnea: CPAP at night.   # hypothyroidism: Check thyroid panel.  Continue levothyroxine.   # anxiety disorder: Stable.  Not on active treatment.  Continue monitoring.   # gout: Continue allopurinol and colchicine  # Vitamin D deficiency, started vitamin D 53 units p.o. weekly, follow with PCP to repeat vitamin D level after 3 months.  Vitamin B12 level 233,  target >400, started vitamin B12 1000 mcg IM injection during hospital stay followed by oral supplement on discharge.  Repeat vitamin B12 level after 3 months  Body mass index is 35.69 kg/m.   Interventions:       Diet: Heart healthy/carb modified DVT Prophylaxis: Subcutaneous Lovenox   Advance goals of care discussion: Full code  Family Communication: family was not present at bedside, at the time of interview.  The pt provided permission to discuss medical plan with the family. Opportunity was given to ask question and all questions were answered satisfactorily.   Disposition:  Pt is from Home, admitted with recurrent syncopal episodes, still has syncope, high risk of fall and readmission, pending neuro work-up, MRI brain with contrast and EEG which precludes a safe discharge. Discharge to SNF as per PT and OT evaluation.  when bed is available, TOC is working on disposition plan.    Subjective: No significant events overnight, in the morning time patient was complaining of headache 8/10, starts from the front of the head and radiates to the mid in the back.  Patient was feeling lightheadedness while standing up yesterday but she feels little bit improvement after IV fluid given for hydration. Patient denies any palpitations, no chest pain, no shortness of breath.    Physical Exam: General:  alert oriented to time, place, and person.  Appear in no distress, affect appropriate Eyes: PERRLA ENT: Oral Mucosa Clear, moist  Neck:  no JVD,  Cardiovascular: S1 and S2 Present, no Murmur,  Respiratory: good respiratory effort, Bilateral Air entry equal and Decreased, no Crackles, no wheezes Abdomen: Bowel Sound present, Soft and no tenderness,  Skin: no rashes Extremities: no Pedal edema, no calf tenderness Neurologic: without any new focal findings Gait not checked due to patient safety concerns  Vitals:   06/16/21 0524 06/16/21 0538 06/16/21 0847 06/16/21 1149  BP: 134/74 (!) 129/97 119/61 112/65  Pulse: (!) 59 61 60 (!) 59  Resp:   19 18  Temp:   97.7 F (36.5 C) 98.1 F (36.7 C)  TempSrc:   Oral   SpO2: 96% 100% 99% 98%  Weight:        Intake/Output Summary  (Last 24 hours) at 06/16/2021 1252 Last data filed at 06/16/2021 0325 Gross per 24 hour  Intake 958.67 ml  Output 1300 ml  Net -341.33 ml   Filed Weights   06/13/21 1443 06/15/21 0450 06/16/21 0224  Weight: 88.5 kg 87.2 kg 89.2 kg    Data Reviewed: I have personally reviewed and interpreted daily labs, tele strips, imagings as discussed above. I reviewed all nursing notes, pharmacy notes, vitals, pertinent old records I have discussed plan of care as described above with RN and patient/family.  CBC: Recent Labs  Lab 06/13/21 1440 06/13/21 2135 06/14/21 0538 06/15/21 0536 06/16/21 0432  WBC 9.7 7.8 8.2 6.2 13.7*  HGB 15.6* 14.3 14.7 14.2 14.1  HCT 47.6* 44.1 44.3 43.8 42.5  MCV 91.9 92.1 92.5 93.6 91.4  PLT 238 207 205 188 357   Basic Metabolic Panel: Recent Labs  Lab 06/13/21 1440 06/13/21 2135 06/14/21 0538 06/15/21 0536 06/16/21 0432  NA 139  --  140 141 140  K 3.4*  --  3.4* 3.9 3.9  CL 100  --  103 107 110  CO2 28  --  27 26 23   GLUCOSE 146*  --  157* 128* 192*  BUN 19  --  17 19 16   CREATININE 0.91 0.60 0.71 0.56 0.55  CALCIUM  9.2  --  9.0 9.0 9.2  MG  --   --  1.9 2.2 1.9  PHOS  --   --  3.8 3.4 2.7    Studies: MR ANGIO HEAD WO CONTRAST  Result Date: 06/15/2021 CLINICAL DATA:  Neuro deficit, acute, stroke suspected; Carotid artery stenosis EXAM: MRA NECK WITHOUT AND WITH CONTRAST MRA HEAD WITHOUT CONTRAST TECHNIQUE: Multiplanar and multiecho pulse sequences of the neck were obtained without and with intravenous contrast. Angiographic images of the neck were obtained using MRA technique without and with intravenous contrast; Angiographic images of the Circle of Willis were obtained using MRA technique without intravenous contrast. CONTRAST:  61mL GADAVIST GADOBUTROL 1 MMOL/ML IV SOLN COMPARISON:  None. FINDINGS: MRA NECK FINDINGS Common, internal, and external carotid arteries are patent. Codominant vertebral arteries are patent. No hemodynamically  significant stenosis or evidence of dissection. MRA HEAD FINDINGS Intracranial internal carotid arteries are patent. Anterior and middle cerebral arteries are patent. Intracranial vertebral arteries are patent. Basilar artery is patent. Posterior cerebral arteries are patent. Bilateral posterior communicating arteries are present. There is no significant stenosis or aneurysm. IMPRESSION: No large vessel occlusion.  No significant stenosis. Electronically Signed   By: Macy Mis M.D.   On: 06/15/2021 14:32   MR ANGIO NECK W WO CONTRAST  Result Date: 06/15/2021 CLINICAL DATA:  Neuro deficit, acute, stroke suspected; Carotid artery stenosis EXAM: MRA NECK WITHOUT AND WITH CONTRAST MRA HEAD WITHOUT CONTRAST TECHNIQUE: Multiplanar and multiecho pulse sequences of the neck were obtained without and with intravenous contrast. Angiographic images of the neck were obtained using MRA technique without and with intravenous contrast; Angiographic images of the Circle of Willis were obtained using MRA technique without intravenous contrast. CONTRAST:  51mL GADAVIST GADOBUTROL 1 MMOL/ML IV SOLN COMPARISON:  None. FINDINGS: MRA NECK FINDINGS Common, internal, and external carotid arteries are patent. Codominant vertebral arteries are patent. No hemodynamically significant stenosis or evidence of dissection. MRA HEAD FINDINGS Intracranial internal carotid arteries are patent. Anterior and middle cerebral arteries are patent. Intracranial vertebral arteries are patent. Basilar artery is patent. Posterior cerebral arteries are patent. Bilateral posterior communicating arteries are present. There is no significant stenosis or aneurysm. IMPRESSION: No large vessel occlusion.  No significant stenosis. Electronically Signed   By: Macy Mis M.D.   On: 06/15/2021 14:32   MR BRAIN W WO CONTRAST  Result Date: 06/16/2021 CLINICAL DATA:  TIA.  Seizure with abnormal neuro exam. EXAM: MRI HEAD WITHOUT AND WITH CONTRAST  TECHNIQUE: Multiplanar, multiecho pulse sequences of the brain and surrounding structures were obtained without and with intravenous contrast. CONTRAST:  41mL GADAVIST GADOBUTROL 1 MMOL/ML IV SOLN COMPARISON:  06/13/2021 FINDINGS: Brain: No acute infarction, hemorrhage, hydrocephalus, extra-axial collection or mass lesion. No abnormal enhancement. Moderate chronic small vessel ischemic type change in the hemispheric white matter. No cortical finding to correlate with seizure history. Vascular: Diffusely patent Skull and upper cervical spine: Normal marrow signal Sinuses/Orbits: Negative IMPRESSION: 1. No acute finding or visible seizure focus. 2. Moderate chronic small vessel ischemia. Electronically Signed   By: Jorje Guild M.D.   On: 06/16/2021 08:23    Scheduled Meds:  allopurinol  100 mg Oral Daily   aspirin EC  81 mg Oral Daily   atorvastatin  20 mg Oral Daily   cyanocobalamin  1,000 mcg Intramuscular Daily   Followed by   Derrill Memo ON 06/21/2021] vitamin B-12  500 mcg Oral Daily   enoxaparin (LOVENOX) injection  0.5 mg/kg Subcutaneous Q24H   gabapentin  600 mg Oral TID   glimepiride  4 mg Oral BID   insulin aspart  0-15 Units Subcutaneous TID WC   insulin aspart  0-5 Units Subcutaneous QHS   levothyroxine  88 mcg Oral Q0600   mycophenolate  250 mg Oral Q12H   sodium chloride flush  3 mL Intravenous Q12H   sulfamethoxazole-trimethoprim  1 tablet Oral Once per day on Mon Wed Fri   Vitamin D (Ergocalciferol)  50,000 Units Oral Q7 days   Continuous Infusions:  sodium chloride 50 mL/hr at 06/16/21 0858   PRN Meds: acetaminophen **OR** acetaminophen, albuterol, butalbital-acetaminophen-caffeine, colchicine, ondansetron **OR** ondansetron (ZOFRAN) IV  Time spent: 35 minutes  Author: Val Riles. MD [facility] 06/16/2021 12:52 PM  To reach On-call, see care teams to locate the attending and reach out to them via [email or web address]. If 7PM-7AM, please contact night-coverage If you  still have difficulty reaching the attending provider, please page the [facility] (Director on Call) for [facility] on amion for assistance.

## 2021-06-16 NOTE — TOC Progression Note (Signed)
Transition of Care [facility]) - Progression Note    Patient Details  Name: Teresa [name] MRN: [id number] Date of Birth: June 26, 1946  [facility]) CM/SW Contact  Shelbie Hutching, RN Phone Number: 06/16/2021, 12:23 PM  Clinical Narrative:    [facility] bed and patient accepts bed offer.  RNCM has started insurance authorization in anticipation of discharge tomorrow.  Patient reports that her son should be able to pick her up and transport if [facility] does not authorize for EMS transport.     Expected Discharge Plan: [place] Barriers to Discharge: Continued Medical Work up  Expected Discharge Plan and Services Expected Discharge Plan: [place]   Discharge Planning Services: CM Consult Post Acute Care Choice: [place] Living arrangements for the past 2 months: Apartment                 DME Arranged: N/A DME Agency: NA       HH Arranged: NA HH Agency: NA         Social Determinants of Health (SDOH) Interventions    Readmission Risk Interventions No flowsheet data found.

## 2021-06-16 NOTE — Progress Notes (Signed)
PT Cancellation Note  Patient Details Name: Teresa [name] MRN: [id number] DOB: Jun 27, 1946   Cancelled Treatment:     Pt is off floor for TEE. Acute PT will continue to follow and progress per current POC.    Willette Pa 06/16/2021, 2:01 PM

## 2021-06-17 LAB — BASIC METABOLIC PANEL
Anion gap: 7 (ref 5–15)
BUN: 18 mg/dL (ref 8–23)
CO2: 21 mmol/L — ABNORMAL LOW (ref 22–32)
Calcium: 8.6 mg/dL — ABNORMAL LOW (ref 8.9–10.3)
Chloride: 114 mmol/L — ABNORMAL HIGH (ref 98–111)
Creatinine, Ser: 0.5 mg/dL (ref 0.44–1.00)
GFR, Estimated: >60 mL/min (ref >60)
Glucose, Bld: 140 mg/dL — ABNORMAL HIGH (ref 70–99)
Potassium: 3.9 mmol/L (ref 3.5–5.1)
Sodium: 142 mmol/L (ref 135–145)

## 2021-06-17 LAB — GLUCOSE, CAPILLARY
Glucose-Capillary: 117 mg/dL — ABNORMAL HIGH (ref 70–99)
Glucose-Capillary: 253 mg/dL — ABNORMAL HIGH (ref 70–99)
Glucose-Capillary: 357 mg/dL — ABNORMAL HIGH (ref 70–99)
Glucose-Capillary: 247 mg/dL — ABNORMAL HIGH (ref 70–99)

## 2021-06-17 LAB — MAGNESIUM: Magnesium: 1.8 mg/dL (ref 1.7–2.4)

## 2021-06-17 LAB — CBC
HCT: 40.2 % (ref 36.0–46.0)
Hemoglobin: 13.3 g/dL (ref 12.0–15.0)
MCH: 30.9 pg (ref 26.0–34.0)
MCHC: 33.1 g/dL (ref 30.0–36.0)
MCV: 93.5 fL (ref 80.0–100.0)
Platelets: 194 10*3/uL (ref 150–400)
RBC: 4.3 MIL/uL (ref 3.87–5.11)
RDW: 14.7 % (ref 11.5–15.5)
WBC: 9 10*3/uL (ref 4.0–10.5)
nRBC: 0 % (ref 0.0–0.2)

## 2021-06-17 LAB — PHOSPHORUS: Phosphorus: 3.1 mg/dL (ref 2.5–4.6)

## 2021-06-17 MED ORDER — DEXAMETHASONE SODIUM PHOSPHATE 10 MG/ML IJ SOLN
10.0000 mg | Freq: Once | INTRAMUSCULAR | Status: AC
  Administered 2021-06-17: 10 mg via INTRAVENOUS
  Filled 2021-06-17: qty 1

## 2021-06-17 MED ORDER — PROCHLORPERAZINE EDISYLATE 10 MG/2ML IJ SOLN
10.0000 mg | Freq: Once | INTRAMUSCULAR | Status: AC
  Administered 2021-06-17: 09:00:00 10 mg via INTRAVENOUS
  Filled 2021-06-17: qty 2

## 2021-06-17 MED ORDER — MAGNESIUM SULFATE 2 GM/50ML IV SOLN: 2.0000 g | Freq: Once | INTRAVENOUS | Status: DC

## 2021-06-17 MED ORDER — TRAZODONE HCL 50 MG PO TABS
25.0000 mg | ORAL_TABLET | Freq: Every day | ORAL | Status: AC
  Administered 2021-06-17: 25 mg via ORAL
  Filled 2021-06-17: qty 1

## 2021-06-17 MED ORDER — MELATONIN 5 MG PO TABS
5.0000 mg | ORAL_TABLET | Freq: Every day | ORAL | Status: DC
  Administered 2021-06-17 – 2021-06-19 (×3): 5 mg via ORAL
  Filled 2021-06-17 (×4): qty 1

## 2021-06-17 MED ORDER — MAGNESIUM SULFATE IN D5W 1-5 GM/100ML-% IV SOLN
1.0000 g | Freq: Once | INTRAVENOUS | Status: AC
  Administered 2021-06-17: 09:00:00 1 g via INTRAVENOUS
  Filled 2021-06-17: qty 100

## 2021-06-17 NOTE — Progress Notes (Signed)
Physical Therapy Treatment Patient Details Name: Teresa [name] MRN: [id number] DOB: 08/03/1946 Today's Date: 06/17/2021   History of Present Illness 75 y.o. female with medical history significant of coronary artery disease, depression, GERD, nonspecific heart murmur, hypothyroidism, essential hypertension, scleroderma, obstructive sleep apnea, who has been having recurrent syncopal episodes she had 2 episodes on Saturday 2 episodes on Sunday and one episode today.  It usually happens suddenly with no dizziness.  No prior episodes.  No fever or chills.  She is a diabetic with known coronary artery disease    PT Comments    Pt in bed, reports feeling slightly better since following session and dizziness has decreased at rest. Pt continues to have significant dizziness w/ upright mobility. Sitting 6/10 lightheadedness, increases to 9/10 standing and transfers. BP drop sit > stand but pt able to tolerate additional standing for continued vital assessment. Primary limitations continue to be dizziness with all mobility limiting pt's ability to participate during session. SNF remains primary discharge recommendation. Skilled PT intervention is indicated to address deficits in function, mobility, and to return to PLOF as able.    Recommendations for follow up therapy are one component of a multi-disciplinary discharge planning process, led by the attending physician.  Recommendations may be updated based on patient status, additional functional criteria and insurance authorization.  Follow Up Recommendations  Skilled nursing-short term rehab (<3 hours/day)     Assistance Recommended at Discharge Intermittent Supervision/Assistance  Equipment Recommendations  None recommended by PT    Recommendations for Other Services       Precautions / Restrictions Precautions Precautions: Fall Precaution Comments: High Restrictions Weight Bearing Restrictions: No     Mobility  Bed Mobility Overal bed  mobility: Needs Assistance Bed Mobility: Supine to Sit     Supine to sit: Min guard Sit to supine: Min guard        Transfers Overall transfer level: Needs assistance Equipment used: Rolling walker (2 wheels) Transfers: Sit to/from Stand;Bed to chair/wheelchair/BSC Sit to Stand: Min guard     Step pivot transfers: Min guard     General transfer comment: x 2 transfer  (chair, BSC) w/ decreased knee extension during stepping, close CGA; continual dizziness with transfer    Ambulation/Gait               General Gait Details: Deferred secondary to orthostasis w/ symptoms   Stairs             Wheelchair Mobility    Modified Rankin (Stroke Patients Only)       Balance Overall balance assessment: Needs assistance Sitting-balance support: Feet supported;Bilateral upper extremity supported Sitting balance-Leahy Scale: Fair Sitting balance - Comments: prefers BUE support but able to raise without posterior lean   Standing balance support: During functional activity;Bilateral upper extremity supported Standing balance-Leahy Scale: Fair Standing balance comment: Able to perform pericare in standing w/ bending over without LOB                            Cognition Arousal/Alertness: Awake/alert Behavior During Therapy: WFL for tasks assessed/performed Overall Cognitive Status: Within Functional Limits for tasks assessed                                 General Comments: AOx4        Exercises General Exercises - Lower Extremity Ankle Circles/Pumps: AROM;20 reps;Seated Long Arc Quad: AROM;20  reps;Seated Hip ABduction/ADduction: AROM;20 reps;Seated Other Exercises Other Exercises: Recliner <> BSC transfer w/ ability to step w/ RW, weakness noted w/ quad and slight instability but no physical assist necessary; able to perform pericare without holding onto RW in standing    General Comments General comments (skin integrity, edema,  etc.): VItals:(seated) 141/73, 65, 96%; (standing) 122/74,66,98% (standing + 2 min): 129/63, 61,100%      Pertinent Vitals/Pain Pain Assessment: Faces Faces Pain Scale: No hurt Pain Intervention(s): Monitored during session    Home Living                          Prior Function            PT Goals (current goals can now be found in the care plan section) Progress towards PT goals: Progressing toward goals    Frequency    Min 2X/week      PT Plan Current plan remains appropriate    Co-evaluation              AM-PAC PT "6 Clicks" Mobility   Outcome Measure  Help needed turning from your back to your side while in a flat bed without using bedrails?: None Help needed moving from lying on your back to sitting on the side of a flat bed without using bedrails?: A Little Help needed moving to and from a bed to a chair (including a wheelchair)?: A Little Help needed standing up from a chair using your arms (e.g., wheelchair or bedside chair)?: None Help needed to walk in hospital room?: A Little Help needed climbing 3-5 steps with a railing? : A Little 6 Click Score: 20    End of Session Equipment Utilized During Treatment: Gait belt Activity Tolerance: Treatment limited secondary to medical complications (Comment) (Dizziness) Patient left: in chair;with call bell/phone within reach;with chair alarm set   PT Visit Diagnosis: Unsteadiness on feet (R26.81);Muscle weakness (generalized) (M62.81);Repeated falls (R29.6);History of falling (Z91.81)     Time: 1275-1700 PT Time Calculation (min) (ACUTE ONLY): 34 min  Charges:                       [employer], SPT

## 2021-06-17 NOTE — Consult Note (Signed)
NEUROLOGY CONSULTATION NOTE   Date of service: June 17, 2021 Patient Name: Teresa [name] MRN:  [id number] DOB:  09-Aug-1945 Reason for consult: syncope Requesting physician: Dr. Val Riles _ _ _   _ __   _ __ _ _  __ __   _ __   __ _  History of Present Illness   This is a 75 yo woman with pmhx significant for CAD, HTN, OSA, scleroderma, depression admitted for syncope on whom neurology is consulted for same. She had 3 episodes within a few days and one since admission. She was found to have orthostatic vitals earlier today. She tells me that she has actually had similar events approximately once per week for the past 2 years since she was critically ill with covid. She states the episodes can occur in any position (even lying down), not precipitated by position change or any other identifiable trigger. She has not gotten any serious injuries over the past 2 years during these events but has had some bumps and bruises. She reports a prodrome of feeling lightheaded and heart racing. No convulsions after the events, which are brief and after which she has normal mental status when she comes to.  Workup this admission:  Telemetry unrevealing Orthostatic vitals (+) MRI brain wwo contrast: normal MRA H&N normal TTE no significant valvular abnl or etiology identified for syncope EEG pending   ROS   Per HPI: all other systems reviewed and are negative  Past History   I have reviewed the following:  Past Medical History:  Diagnosis Date   Complication of anesthesia    Depression    Diabetes mellitus without complication (HCC)    Dyspnea    DOE   Edema    FEET/LEGS   GERD (gastroesophageal reflux disease)    Gout    Headache    Heart murmur    History of hiatal hernia    History of orthopnea    Hypertension    Hypothyroidism    PONV (postoperative nausea and vomiting)    Scleroderma (HCC)    hands   Sleep apnea    NO CPAP   Thyroid disease    Vertigo    Past  Surgical History:  Procedure Laterality Date   ABDOMINAL HYSTERECTOMY     ovaries intact   BLADDER SURGERY     bladder tuck   BREAST BIOPSY Right 03/20/2016   neg x 2 area   CARDIAC CATHETERIZATION     CARPAL TUNNEL RELEASE     CATARACT EXTRACTION W/PHACO Left 09/17/2018   Procedure: CATARACT EXTRACTION PHACO AND INTRAOCULAR LENS PLACEMENT ([place]) LEFT, DIABETIC;  Surgeon: Birder Robson, MD;  Location: [facility] OR[facility];  Service: Ophthalmology;  Laterality: Left;  [place] 00:34 CDE 4.85 Fluid pack lot # [id number]   CATARACT EXTRACTION W/PHACO Right 10/15/2018   Procedure: CATARACT EXTRACTION PHACO AND INTRAOCULAR LENS PLACEMENT ([facility])-RIGHT;  Surgeon: Birder Robson, MD;  Location: [facility];  Service: Ophthalmology;  Laterality: Right;  [place] 00:27.6 CDE 3.43 Fluid Pack Lot # [id number]   COLONOSCOPY WITH PROPOFOL N/A 09/27/2018   Procedure: COLONOSCOPY WITH PROPOFOL;  Surgeon: Virgel Manifold, MD;  Location: [facility] END[facility];  Service: Endoscopy;  Laterality: N/A;   DILATION AND CURETTAGE OF UTERUS     EYE SURGERY     eyelid   FRACTURE SURGERY     left ankle-plate and screws palced   LITHOTRIPSY     NISSEN FUNDOPLICATION     SHOULDER SURGERY Right    TONSILLECTOMY  Family History  Problem Relation Age of Onset   Stroke Mother    Hypertension Mother    Heart disease Mother    Arthritis Mother    Heart disease Father    Hypertension Father    Diabetes Sister    Hypertension Sister    Asthma Sister    Hypertension Sister    Diverticulitis Sister    Colon cancer Maternal Grandmother    Breast cancer Maternal Grandmother    Breast cancer Maternal Aunt    Social History   Socioeconomic History   Marital status: Divorced    Spouse name: Not on file   Number of children: 2   Years of education: Not on file   Highest education level: Associate degree: occupational, Hotel manager, or vocational program  Occupational History   Occupation: retired  Tobacco Use   Smoking status: Never    Smokeless tobacco: Never  Scientific laboratory technician Use: Never used  Substance and Sexual Activity   Alcohol use: No    Alcohol/week: 0.0 standard drinks   Drug use: Never   Sexual activity: Not on file  Other Topics Concern   Not on file  Social History Narrative   Not on file   Social Determinants of Health   Financial Resource Strain: High Risk   Difficulty of Paying Living Expenses: Hard  Food Insecurity: No Food Insecurity   Worried About Running Out of Food in the Last Year: Never true   [place] in the Last Year: Never true  Transportation Needs: No Transportation Needs   Lack of Transportation (Medical): No   Lack of Transportation (Non-Medical): No  Physical Activity: Sufficiently Active   Days of Exercise per Week: 7 days   Minutes of Exercise per Session: 30 min  Stress: Stress Concern Present   Feeling of Stress : Very much  Social Connections: Moderately Integrated   Frequency of Communication with Friends and Family: More than three times a week   Frequency of Social Gatherings with Friends and Family: More than three times a week   Attends Religious Services: More than 4 times per year   Active Member of [employer] or Organizations: Yes   Attends Music therapist: More than 4 times per year   Marital Status: Divorced   Allergies  Allergen Reactions   Bacitracin-Neomycin-Polymyxin    Clarithromycin Other (See Comments), Nausea Only and Nausea And Vomiting   Codeine     Other reaction(s): Hallucination   Dilaudid  [Hydromorphone Hcl] Nausea And Vomiting   Iodine    Iohexol      Desc: PT HAS HIVES/ITCHING    Neomycin-Bacitracin Zn-Polymyx    Tamiflu  [Oseltamivir Phosphate]     Other reaction(s): Abdominal Pain, Vomiting   Zolpidem Nausea And Vomiting and Other (See Comments)    Hallucinations   Benzalkonium Chloride Itching, Rash and Swelling   Lidocaine Hcl Itching, Rash and Swelling   Morphine Nausea Only, Rash and Nausea And Vomiting    Tape Itching and Rash    Adhesive tape - silicone   Tapentadol Rash    Medications   Medications Prior to Admission  Medication Sig Dispense Refill Last Dose   allopurinol (ZYLOPRIM) 100 MG tablet TAKE 1 TABLET BY MOUTH EVERY DAY 90 tablet 0 06/12/2021 at 2100   atorvastatin (LIPITOR) 10 MG tablet TAKE 1 TABLET BY MOUTH EVERYDAY AT BEDTIME 90 tablet 3 06/12/2021 at 2100   furosemide (LASIX) 40 MG tablet TAKE 1 TABLET BY MOUTH EVERY  DAY 90 tablet 1 06/13/2021 at 0900   gabapentin (NEURONTIN) 800 MG tablet Take 1 tablet (800 mg total) by mouth 3 (three) times daily. 270 tablet 3 06/13/2021 at 0900   glimepiride (AMARYL) 4 MG tablet TAKE 1 TABLET (4 MG TOTAL) BY MOUTH 2 (TWO) TIMES DAILY. 180 tablet 1 06/13/2021 at 0900   JARDIANCE 25 MG TABS tablet TAKE 25 MG BY MOUTH DAILY. 90 tablet 1 06/13/2021 at 0900   levothyroxine (SYNTHROID) 88 MCG tablet TAKE 1 TABLET BY MOUTH EVERY DAY 90 tablet 3 06/13/2021 at 0900   metFORMIN (GLUCOPHAGE) 1000 MG tablet TAKE 1 TABLET BY MOUTH TWICE A DAY 180 tablet 0 06/13/2021 at 0900   mycophenolate (CELLCEPT) 250 MG capsule Take 250 mg by mouth every 12 (twelve) hours.   06/13/2021 at 0900   pioglitazone (ACTOS) 30 MG tablet TAKE 1 TABLET BY MOUTH EVERY DAY 90 tablet 1 06/13/2021 at 0900   sulfamethoxazole-trimethoprim (BACTRIM) 400-80 MG tablet Take 1 tablet by mouth 3 (three) times a week.   06/13/2021 at 0900   acetaminophen (TYLENOL) 500 MG tablet Take 500 mg by mouth every 6 (six) hours as needed for mild pain or headache.   prn at unknown   albuterol (VENTOLIN HFA) 108 (90 Base) MCG/ACT inhaler SMARTSIG:2 Inhalation Via Inhaler Every 6 Hours PRN   prn at unknown   benzonatate (TESSALON) 200 MG capsule Take 1 capsule (200 mg total) by mouth 3 (three) times daily. (Patient not taking: No sig reported)  0 Not Taking   budesonide (PULMICORT) 0.5 MG/2ML nebulizer solution Take 0.5 mg by nebulization as needed.      colchicine 0.6 MG tablet Take 0.6 mg by mouth daily as  needed (for gout flare-ups).    prn at unknown   Continuous Blood Gluc Receiver (FREESTYLE LIBRE 2 READER) DEVI 1 each by Does not apply route continuous. 1 each 0    Continuous Blood Gluc Sensor (FREESTYLE LIBRE 2 SENSOR) MISC USE AS DIRECTED 4 each 12    fluconazole (DIFLUCAN) 100 MG tablet Take 100 mg by mouth daily.   prn at unknown   nystatin cream (MYCOSTATIN) Apply 1 application topically 2 (two) times daily. (Patient taking differently: Apply 1 application topically 2 (two) times daily as needed (for diabetic yeast reactions.).) 30 g 0 prn at unknown   ondansetron (ZOFRAN) 4 MG tablet TAKE 1 TABLET BY MOUTH EVERY 8 HOURS AS NEEDED FOR NAUSEA AND VOMITING 45 tablet 0 prn at unknown   OZEMPIC, 0.25 OR 0.5 MG/DOSE, 2 MG/1.5ML SOPN INJECT 0.5MG  INTO THE SKIN ONCE A WEEK. 4.5 mL 2 06/09/2021   pantoprazole (PROTONIX) 20 MG tablet Take by mouth.         Current Facility-Administered Medications:    0.9 %  sodium chloride infusion, , Intravenous, Continuous, Val Riles, MD, Last Rate: 50 mL/hr at 06/17/21 0127, New Bag at 06/17/21 0127   acetaminophen (TYLENOL) tablet 650 mg, 650 mg, Oral, Q6H PRN, 650 mg at 06/17/21 0359 **OR** acetaminophen (TYLENOL) suppository 650 mg, 650 mg, Rectal, Q6H PRN, Garba, Mohammad L, MD   albuterol (PROVENTIL) (2.5 MG/3ML) 0.083% nebulizer solution 2.5 mg, 2.5 mg, Inhalation, Q4H PRN, Jonelle Sidle, Mohammad L, MD   allopurinol (ZYLOPRIM) tablet 100 mg, 100 mg, Oral, Daily, Garba, Mohammad L, MD, 100 mg at 06/16/21 1009   aspirin EC tablet 81 mg, 81 mg, Oral, Daily, Clabe Seal, PA-C, 81 mg at 06/16/21 1009   atorvastatin (LIPITOR) tablet 20 mg, 20 mg, Oral, Daily, Elwyn Reach, MD,  20 mg at 06/16/21 1009   butalbital-acetaminophen-caffeine (FIORICET) 50-325-40 MG per tablet 1 tablet, 1 tablet, Oral, Q6H PRN, Val Riles, MD, 1 tablet at 06/16/21 1109   colchicine tablet 0.6 mg, 0.6 mg, Oral, Daily PRN, Elwyn Reach, MD   cyanocobalamin ((VITAMIN B-12))  injection 1,000 mcg, 1,000 mcg, Intramuscular, Daily, 1,000 mcg at 06/16/21 1010 **FOLLOWED BY** [START ON 06/21/2021] vitamin B-12 (CYANOCOBALAMIN) tablet 500 mcg, 500 mcg, Oral, Daily, Val Riles, MD   enoxaparin (LOVENOX) injection 45 mg, 0.5 mg/kg, Subcutaneous, Q24H, Garba, Mohammad L, MD, 45 mg at 06/16/21 2130   gabapentin (NEURONTIN) capsule 600 mg, 600 mg, Oral, TID, Val Riles, MD, 600 mg at 06/16/21 2132   glimepiride (AMARYL) tablet 4 mg, 4 mg, Oral, BID, Jonelle Sidle, Mohammad L, MD, 4 mg at 06/16/21 2131   insulin aspart (novoLOG) injection 0-15 Units, 0-15 Units, Subcutaneous, TID WC, Gala Romney L, MD, 3 Units at 06/16/21 1751   insulin aspart (novoLOG) injection 0-5 Units, 0-5 Units, Subcutaneous, QHS, Gala Romney L, MD, 4 Units at 06/15/21 2144   levothyroxine (SYNTHROID) tablet 88 mcg, 88 mcg, Oral, Q0600, Elwyn Reach, MD, 88 mcg at 06/17/21 0545   mycophenolate (CELLCEPT) capsule 250 mg, 250 mg, Oral, Q12H, Val Riles, MD, 250 mg at 06/16/21 2131   ondansetron (ZOFRAN) tablet 4 mg, 4 mg, Oral, Q6H PRN **OR** ondansetron (ZOFRAN) injection 4 mg, 4 mg, Intravenous, Q6H PRN, Jonelle Sidle, Mohammad L, MD   sodium chloride flush (NS) 0.9 % injection 3 mL, 3 mL, Intravenous, Q12H, Garba, Mohammad L, MD, 3 mL at 06/16/21 2130   sulfamethoxazole-trimethoprim (BACTRIM) 400-80 MG per tablet 1 tablet, 1 tablet, Oral, Once per day on Mon Wed Fri, Kumar, Fabio Neighbors, MD, 1 tablet at 06/15/21 5427   Vitamin D (Ergocalciferol) (DRISDOL) capsule 50,000 Units, 50,000 Units, Oral, Q7 days, Val Riles, MD, 50,000 Units at 06/14/21 1232  Vitals   Vitals:   06/16/21 1629 06/16/21 2007 06/16/21 2326 06/17/21 0421  BP: (!) 124/57 (!) 110/59 (!) 105/54 110/61  Pulse: (!) 59 67 64 (!) 51  Resp: 18 16 18 16   Temp: 98.3 F (36.8 C) 97.8 F (36.6 C) 97.6 F (36.4 C) 97.8 F (36.6 C)  TempSrc:   Oral Oral  SpO2: 99% 97% 97% 95%  Weight:         Body mass index is 35.97 kg/m.  Physical  Exam   Physical Exam Gen: A&O x4, NAD HEENT: Atraumatic, normocephalic;mucous membranes moist; oropharynx clear, tongue without atrophy or fasciculations. Neck: Supple, trachea midline. Resp: CTAB, no w/r/r CV: RRR, no m/g/r; nml S1 and S2. 2+ symmetric peripheral pulses. Abd: soft/NT/ND; nabs x 4 quad Extrem: Nml bulk; no cyanosis, clubbing, or edema.  Neuro: *MS: A&O x4. Follows multi-step commands.  *Speech: fluid, nondysarthric, able to name and repeat *CN:    I: Deferred   II,III: PERRLA, VFF by confrontation, optic discs unable to be visualized 2/2 pupillary constriction   III,IV,VI: EOMI w/o nystagmus, no ptosis   V: Sensation intact from V1 to V3 to LT   VII: Eyelid closure was full.  Smile symmetric.   VIII: Hearing intact to voice   IX,X: Voice normal, palate elevates symmetrically    XI: SCM/trap 5/5 bilat   XII: Tongue protrudes midline, no atrophy or fasciculations   *Motor:   Normal bulk.  No tremor, rigidity or bradykinesia. No pronator drift.    Strength: Dlt Bic Tri WrE WrF FgS Gr HF KnF KnE PlF DoF    Left 5  5 5 5 5 5 5 5 5 5 5 5     Right 5 5 5 5 5 5 5 5 5 5 5 5     *Sensory: Intact to light touch, pinprick, temperature vibration throughout. Symmetric. Propioception intact bilat.  No double-simultaneous extinction.  *Coordination:  Finger-to-nose, heel-to-shin, rapid alternating motions were intact. *Reflexes:  2+ and symmetric throughout without clonus; toes down-going bilat *Gait: deferred   Labs   CBC:  Recent Labs  Lab 06/15/21 0536 06/16/21 0432  WBC 6.2 13.7*  HGB 14.2 14.1  HCT 43.8 42.5  MCV 93.6 91.4  PLT 188 428    Basic Metabolic Panel:  Lab Results  Component Value Date   NA 140 06/16/2021   K 3.9 06/16/2021   CO2 23 06/16/2021   GLUCOSE 192 (H) 06/16/2021   BUN 16 06/16/2021   CREATININE 0.55 06/16/2021   CALCIUM 9.2 06/16/2021   GFRNONAA >60 06/16/2021   GFRAA 86 09/14/2020   Lipid Panel:  Lab Results  Component Value  Date   [place] 95 05/16/2021   HgbA1c:  Lab Results  Component Value Date   HGBA1C 8.2 (H) 05/16/2021   Urine Drug Screen: No results found for: LABOPIA, COCAINSCRNUR, LABBENZ, AMPHETMU, THCU, LABBARB  Alcohol Level No results found for: ETH   Impression   This is a 75 yo woman with pmhx significant for CAD, HTN, OSA, scleroderma, depression admitted for syncope on whom neurology is consulted for same. She was found to be orthostatic by vitals today. Given that these episodes started after covid infection I suspect there is a component of dysautonomia/POTS. She would likely benefit from a tilt table test as an outpatient (unable to be performed as inpatient). Workup overall including MRI brain wwo, MRA H&N, TTE, tele has been unremarkable.  Recommendations   - F/u EEG - Recommend discharge with ambulatory cardiac monitor - Fluids for orthostatis - Consider tilt table test post discharge  Will continue to follow ______________________________________________________________________   Thank you for the opportunity to take part in the care of this patient. If you have any further questions, please contact the neurology consultation attending.  Signed,  Su Monks, MD [facility] [phone]  If 7pm- 7am, please page neurology on call as listed in [place].

## 2021-06-17 NOTE — Procedures (Signed)
Routine EEG Report  Teresa [name] is a 75 y.o. female with a history of syncope who is undergoing an EEG to evaluate for seizures.  Report: This EEG was acquired with electrodes placed according to the International 10-20 electrode system (including Fp1, Fp2, F3, F4, C3, C4, P3, P4, O1, O2, T3, T4, T5, T6, A1, A2, Fz, Cz, Pz). The following electrodes were missing or displaced: none.  The occipital dominant rhythm was 9 Hz. This activity is reactive to stimulation. Drowsiness was manifested by background fragmentation; deeper stages of sleep were identified by K complexes and sleep spindles. There was no focal slowing. There were no interictal epileptiform discharges. There were no electrographic seizures identified. There was no abnormal response to photic stimulation or hyperventilation.   Impression: This EEG was obtained while awake and asleep and is normal.    Clinical Correlation: Normal EEGs, however, do not rule out epilepsy.  Su Monks, MD [facility] [phone]  If 7pm- 7am, please page neurology on call as listed in [place].

## 2021-06-17 NOTE — Care Management Important Message (Signed)
Important Message  Patient Details  Name: Teresa [name] MRN: [id number] Date of Birth: [birth year]   Medicare Important Message Given:  Yes     Juliann Pulse A Ileane Sando 06/17/2021, 1:17 PM

## 2021-06-17 NOTE — Progress Notes (Signed)
[facility] Progress Note  Patient: Teresa [name]    GYF:[id number]  DOA: 06/13/2021     Date of Service: the patient was seen and examined on 06/17/2021  Chief Complaint  Patient presents with   Near Syncope   Brief hospital course: LANORA REVERON is a 75 y.o. female with medical history significant of coronary artery disease, depression, GERD, nonspecific heart murmur, hypothyroidism, essential hypertension, scleroderma, obstructive sleep apnea, who has been having recurrent syncopal episodes she had 2 episodes on Saturday 2 episodes on Sunday and one episode today.  It usually happens suddenly with no dizziness.  No prior episodes.  No fever or chills.  She is a diabetic with known coronary artery disease.  No recent change in her medications.  Denied any prodrome.  Patient is being admitted for syncopal work-up..   ED Course: Temperature 98.6 blood pressure 140/118, pulse 92, respirate 20 and oxygen sat 95% room air.  Sodium 139 potassium 3.4 chloride 100 CO2 28 glucose 146.  BUN 19 creatinine 0.99 calcium 9.2.  CBC all within normal.  Urinalysis negative.  Influenza and COVID-19 negative.  Head CT without contrast is negative.  MRI of the brain ordered.  Patient being admitted for further evaluation and treatment.      Assessment and Plan: Principal Problem:   Syncope and collapse Active Problems:   Hypothyroidism   Diabetes mellitus type II, non insulin dependent (HCC)   Anxiety state   Essential hypertension   CAD (coronary artery disease)   Sleep apnea   Chronic diastolic CHF (congestive heart failure) (HCC)   Hypokalemia       # Syncope and collapse: Probably cardiac versus neurologic.  Could be orthostatic hypotension and insomnia could be contributing to syncopal episodes?? Continue to monitor on telemetry Blood glucose seems within normal range,  11/9 orthostatic hypotension, patient had fluctuating blood pressure while standing. roponin negative, BNP within  normal range, negative orthostatic hypotension  Potassium level 3.4, repleted, mag 1.9 CT head no acute findings, MRI brain no acute findings 2D echocardiogram LVEF 65%, no significant findings thyroid panel within normal range Continue fall precaution Cardiology consult appreciated, recommended musculoskeletal chest pain, needs Holter monitor for 2 weeks as an outpatient 11/9 patient was complaining of migraine headaches and was feeling headache during syncopal episodes, which might be contributing to syncope. 11/9 Patient was given NS 250 bolus, Decadron 10 mg IV x1 dose and magnesium 1 g IV 1 dose Started Fioricet as needed for migraine headaches Neurology consulted, MRA head and neck negative for any significant findings. 11/10 Neurology recommended MRI brain with contrast and EEG, both negative, no significant findings. 11/10 orthostatic hypotension resolved after IV fluid given, we will continue maintenance IV fluid for hydration NS 50 mill per hour, patient was advised to move slowly and hold onto something due to orthostatic hypotension. Patient was requesting Fioricet as she was still complaining of headache Patient needs Holter monitor as per cardiology and tilt table test as per neurology as an outpatient  # Insomnia, patient c/o lack of sleep on 06/17/2021, that may be contributing to all the symptoms as patient is not sleeping properly.   Started melatonin 5 mg nightly and trazodone 25 mg nightly x1 dose Reassess tomorrow and then discharge planning.  # hypokalemia: Replete potassium.   # chronic diastolic heart failure: Appears compensated.  Continue home regimen   # diabetes: Non-insulin-dependent.  Continue sliding scale insulin   # coronary artery disease: No significant decompensation.  Continue supportive  care and monitoring.   # obstructive sleep apnea: CPAP at night.   # hypothyroidism: Check thyroid panel.  Continue levothyroxine.   # anxiety disorder: Stable.   Not on active treatment.  Continue monitoring.   # gout: Continue allopurinol and colchicine  # Vitamin D deficiency, started vitamin D 53 units p.o. weekly, follow with PCP to repeat vitamin D level after 3 months.  Vitamin B12 level 233, target >400, started vitamin B12 1000 mcg IM injection during hospital stay followed by oral supplement on discharge.  Repeat vitamin B12 level after 3 months  Body mass index is 35.69 kg/m.  Interventions:       Diet: Heart healthy/carb modified DVT Prophylaxis: Subcutaneous Lovenox   Advance goals of care discussion: Full code  Family Communication: family was not present at bedside, at the time of interview.  The pt provided permission to discuss medical plan with the family. Opportunity was given to ask question and all questions were answered satisfactorily.   Disposition:  Pt is from Home, admitted with recurrent syncopal episodes, migraine headache, still has headache 8/10 today morning, high risk of fall and readmission, which precludes a safe discharge. Discharge to SNF as per PT and OT evaluation, tomorrow a.m. if patient sleeps well tonight and headache resolves and no dizziness or syncopal episodes on ambulation. Patient can be discharged to SNF tomorrow a.m. if remains asymptomatic   Subjective: No significant events overnight, in the morning time patient was complaining of headache 8/10, which improved after Compazine, Decadron one-time dose.  After medication headache was 5/10, patient stated that she did not sleep well, patient is having problem with insomnia.  Still does not feel great and feels lightheaded. Today we will continue current treatment and plan for disposition tomorrow a.m.   Physical Exam: General:  alert oriented to time, place, and person.  Appear in no distress, affect appropriate Eyes: PERRLA ENT: Oral Mucosa Clear, moist  Neck:  no JVD,  Cardiovascular: S1 and S2 Present, no Murmur,  Respiratory: good  respiratory effort, Bilateral Air entry equal and Decreased, no Crackles, no wheezes Abdomen: Bowel Sound present, Soft and no tenderness,  Skin: no rashes Extremities: no Pedal edema, no calf tenderness Neurologic: without any new focal findings Gait not checked due to patient safety concerns  Vitals:   06/16/21 2326 06/17/21 0421 06/17/21 0752 06/17/21 1136  BP: (!) 105/54 110/61 126/64 (!) 147/64  Pulse: 64 (!) 51 (!) 44 (!) 59  Resp: 18 16 18 18   Temp: 97.6 F (36.4 C) 97.8 F (36.6 C) 98 F (36.7 C) 97.6 F (36.4 C)  TempSrc: Oral Oral    SpO2: 97% 95% 98% 95%  Weight:        Intake/Output Summary (Last 24 hours) at 06/17/2021 1237 Last data filed at 06/17/2021 1015 Gross per 24 hour  Intake 1161.95 ml  Output --  Net 1161.95 ml   Filed Weights   06/13/21 1443 06/15/21 0450 06/16/21 0224  Weight: 88.5 kg 87.2 kg 89.2 kg    Data Reviewed: I have personally reviewed and interpreted daily labs, tele strips, imagings as discussed above. I reviewed all nursing notes, pharmacy notes, vitals, pertinent old records I have discussed plan of care as described above with RN and patient/family.  CBC: Recent Labs  Lab 06/13/21 2135 06/14/21 0538 06/15/21 0536 06/16/21 0432 06/17/21 0656  WBC 7.8 8.2 6.2 13.7* 9.0  HGB 14.3 14.7 14.2 14.1 13.3  HCT 44.1 44.3 43.8 42.5 40.2  MCV 92.1 92.5  93.6 91.4 93.5  PLT 207 205 188 219 638   Basic Metabolic Panel: Recent Labs  Lab 06/13/21 1440 06/13/21 2135 06/14/21 0538 06/15/21 0536 06/16/21 0432 21-Jun-2021 0656  NA 139  --  140 141 140 142  K 3.4*  --  3.4* 3.9 3.9 3.9  CL 100  --  103 107 110 114*  CO2 28  --  27 26 23  21*  GLUCOSE 146*  --  157* 128* 192* 140*  BUN 19  --  17 19 16 18   CREATININE 0.91 0.60 0.71 0.56 0.55 0.50  CALCIUM 9.2  --  9.0 9.0 9.2 8.6*  MG  --   --  1.9 2.2 1.9 1.8  PHOS  --   --  3.8 3.4 2.7 3.1    Studies: EEG adult  Result Date: June 21, 2021 Derek Jack, MD     06/21/21  12:23 PM Routine EEG Report ANAKAREN CAMPION is a 75 y.o. female with a history of syncope who is undergoing an EEG to evaluate for seizures. Report: This EEG was acquired with electrodes placed according to the International 10-20 electrode system (including Fp1, Fp2, F3, F4, C3, C4, P3, P4, O1, O2, T3, T4, T5, T6, A1, A2, Fz, Cz, Pz). The following electrodes were missing or displaced: none. The occipital dominant rhythm was 9 Hz. This activity is reactive to stimulation. Drowsiness was manifested by background fragmentation; deeper stages of sleep were identified by K complexes and sleep spindles. There was no focal slowing. There were no interictal epileptiform discharges. There were no electrographic seizures identified. There was no abnormal response to photic stimulation or hyperventilation. Impression: This EEG was obtained while awake and asleep and is normal.   Clinical Correlation: Normal EEGs, however, do not rule out epilepsy. Su Monks, MD [facility] [phone] If 7pm- 7am, please page neurology on call as listed in [place].    Scheduled Meds:  allopurinol  100 mg Oral Daily   aspirin EC  81 mg Oral Daily   atorvastatin  20 mg Oral Daily   cyanocobalamin  1,000 mcg Intramuscular Daily   Followed by   Derrill Memo ON 06/21/2021] vitamin B-12  500 mcg Oral Daily   enoxaparin (LOVENOX) injection  0.5 mg/kg Subcutaneous Q24H   gabapentin  600 mg Oral TID   glimepiride  4 mg Oral BID   insulin aspart  0-15 Units Subcutaneous TID WC   insulin aspart  0-5 Units Subcutaneous QHS   levothyroxine  88 mcg Oral Q0600   melatonin  5 mg Oral QHS   mycophenolate  250 mg Oral Q12H   sodium chloride flush  3 mL Intravenous Q12H   sulfamethoxazole-trimethoprim  1 tablet Oral Once per day on Mon Wed Fri   traZODone  25 mg Oral QHS   Vitamin D (Ergocalciferol)  50,000 Units Oral Q7 days   Continuous Infusions:  sodium chloride 50 mL/hr at Jun 21, 2021 0127   PRN Meds: acetaminophen **OR**  acetaminophen, albuterol, butalbital-acetaminophen-caffeine, colchicine, ondansetron **OR** ondansetron (ZOFRAN) IV  Time spent: 35 minutes  Author: Val Riles. MD [facility] 06-21-21 12:37 PM  To reach On-call, see care teams to locate the attending and reach out to them via [email or web address]. If 7PM-7AM, please contact night-coverage If you still have difficulty reaching the attending provider, please page the [facility] (Director on Call) for [facility] on amion for assistance.

## 2021-06-17 NOTE — TOC Progression Note (Signed)
Transition of Care [facility]) - Progression Note    Patient Details  Name: Teresa [name] MRN: [id number] Date of Birth: [birth year]  [facility]) CM/SW Contact  Shelbie Hutching, RN Phone Number: 06/17/2021, 9:24 AM  Clinical Narrative:    Patient has been approved for SNF, auth # [id number], patient has also been approved for ambulance transport [employer] # [phone].     Expected Discharge Plan: [place] Barriers to Discharge: Continued Medical Work up  Expected Discharge Plan and Services Expected Discharge Plan: [place]   Discharge Planning Services: CM Consult Post Acute Care Choice: [place] Living arrangements for the past 2 months: Apartment                 DME Arranged: N/A DME Agency: NA       HH Arranged: NA HH Agency: NA         Social Determinants of Health (SDOH) Interventions    Readmission Risk Interventions No flowsheet data found.

## 2021-06-17 NOTE — Plan of Care (Signed)
Neurology plan of care  Please see consult note 06/16/21 for history and workup to date. rEEG WNL. No further inpatient neurologic workup indicated. I will refer her to outpatient neurology for further workup of dysautonomia to include tilt table test if she is still having spells after discharge.  Su Monks, MD [facility] [phone]  If 7pm- 7am, please page neurology on call as listed in [place].

## 2021-06-17 NOTE — Progress Notes (Signed)
Occupational Therapy Treatment Patient Details Name: Teresa [name] MRN: [id number] DOB: July 17, 1946 Today's Date: 06/17/2021   History of present illness 75 y.o. female with medical history significant of coronary artery disease, depression, GERD, nonspecific heart murmur, hypothyroidism, essential hypertension, scleroderma, obstructive sleep apnea, who has been having recurrent syncopal episodes she had 2 episodes on Saturday 2 episodes on Sunday and one episode today.  It usually happens suddenly with no dizziness.  No prior episodes.  No fever or chills.  She is a diabetic with known coronary artery disease   OT comments  Upon entering the room, pt supine in bed with no c/o pain and agreeable to OT intervention. Pt requesting to wash and change clothing. Supine >sit with min A to EOB. Pt sitting for UB self care with set up A to obtain all needed items. Pt standing with min A for balance while washing buttocks and peri area. Pt needs assistance to thread clothing onto B feet and pt stands with min A and needs assist to pull over B hips. Pt takes min A stand pivot transfer to [facility] for toileting needing with min A for clothing management. Pt returns back to bed and performs sit >supine with min guard. Pt does report dizziness with standing but that it resolves quickly. Pt continues to benefit from OT intervention.    Recommendations for follow up therapy are one component of a multi-disciplinary discharge planning process, led by the attending physician.  Recommendations may be updated based on patient status, additional functional criteria and insurance authorization.    Follow Up Recommendations  Skilled nursing-short term rehab (<3 hours/day)    Assistance Recommended at Discharge Intermittent Supervision/Assistance  Equipment Recommendations  Other (comment) (defer to next venue of care)       Precautions / Restrictions Precautions Precautions: Fall Precaution Comments:  High Restrictions Weight Bearing Restrictions: No       Mobility Bed Mobility Overal bed mobility: Needs Assistance Bed Mobility: Supine to Sit     Supine to sit: Min assist Sit to supine: Min guard        Transfers Overall transfer level: Needs assistance Equipment used: 1 person hand held assist Transfers: Sit to/from Stand;Bed to chair/wheelchair/BSC Sit to Stand: Min assist   Step pivot transfers: Min assist             Balance Overall balance assessment: Needs assistance Sitting-balance support: Feet supported;Bilateral upper extremity supported Sitting balance-Leahy Scale: Fair     Standing balance support: Single extremity supported;During functional activity Standing balance-Leahy Scale: Fair Standing balance comment: requires BUE support                           ADL either performed or assessed with clinical judgement   ADL Overall ADL's : Needs assistance/impaired     Grooming: Wash/dry hands;Wash/dry face;Sitting;Set up   Upper Body Bathing: Set up;Sitting   Lower Body Bathing: Minimal assistance;Sit to/from stand Lower Body Bathing Details (indicate cue type and reason): min A for standing balance Upper Body Dressing : Set up;Sitting   Lower Body Dressing: Moderate assistance;Sit to/from stand   Toilet Transfer: Minimal assistance;BSC/3in1;Ambulation   Toileting- Clothing Manipulation and Hygiene: Moderate assistance Toileting - Clothing Manipulation Details (indicate cue type and reason): Pt performs hygiene but needing assistance with clothing management and balance            Extremity/Trunk Assessment Upper Extremity Assessment Upper Extremity Assessment: Generalized weakness  Vision Patient Visual Report: No change from baseline            Cognition Arousal/Alertness: Awake/alert Behavior During Therapy: WFL for tasks assessed/performed Overall Cognitive Status: Within Functional Limits for tasks  assessed                                 General Comments: AOx4                     Pertinent Vitals/ Pain       Pain Assessment: No/denies pain         Frequency  Min 2X/week        Progress Toward Goals  OT Goals(current goals can now be found in the care plan section)  Progress towards OT goals: Progressing toward goals  Acute Rehab OT Goals Patient Stated Goal: to get better OT Goal Formulation: With patient Time For Goal Achievement: 06/28/21 Potential to Achieve Goals: [place] Discharge plan remains appropriate;Frequency remains appropriate       AM-PAC OT "6 Clicks" Daily Activity     Outcome Measure   Help from another person eating meals?: None Help from another person taking care of personal grooming?: A Little Help from another person toileting, which includes using toliet, bedpan, or urinal?: A Little Help from another person bathing (including washing, rinsing, drying)?: A Little Help from another person to put on and taking off regular upper body clothing?: None Help from another person to put on and taking off regular lower body clothing?: A Little 6 Click Score: 20    End of Session    OT Visit Diagnosis: Unsteadiness on feet (R26.81);Repeated falls (R29.6);Muscle weakness (generalized) (M62.81);History of falling (Z91.81)   Activity Tolerance Patient tolerated treatment well   Patient Left with call bell/phone within reach;in bed;with bed alarm set   Nurse Communication Mobility status        Time: 6433-2951 OT Time Calculation (min): 38 min  Charges: OT General Charges $OT Visit: 1 Visit OT Treatments $Self Care/Home Management : 38-52 mins  Darleen Crocker, MS, OTR/L , CBIS ascom [phone]  06/17/21, 12:47 PM

## 2021-06-17 NOTE — TOC Progression Note (Signed)
Transition of Care [facility]) - Progression Note    Patient Details  Name: Teresa [name] MRN: [id number] Date of Birth: 07/10/1946  [facility] CM/SW Contact  Shelbie Hutching, RN Phone Number: 06/17/2021, 2:39 PM  Clinical Narrative:    Patient not medically ready for DC today per MD.  [facility] cannot accept admissions over the weekend so patient can discharge on Monday.     Expected Discharge Plan: Major Barriers to Discharge: Continued Medical Work up  Expected Discharge Plan and Services Expected Discharge Plan: [place]   Discharge Planning Services: CM Consult Post Acute Care Choice: [place] Living arrangements for the past 2 months: Apartment                 DME Arranged: N/A DME Agency: NA       HH Arranged: NA HH Agency: NA         Social Determinants of Health (SDOH) Interventions    Readmission Risk Interventions No flowsheet data found.

## 2021-06-18 DIAGNOSIS — E119 Type 2 diabetes mellitus without complications: Secondary | ICD-10-CM

## 2021-06-18 DIAGNOSIS — F411 Generalized anxiety disorder: Secondary | ICD-10-CM

## 2021-06-18 DIAGNOSIS — I2510 Atherosclerotic heart disease of native coronary artery without angina pectoris: Secondary | ICD-10-CM

## 2021-06-18 DIAGNOSIS — E876 Hypokalemia: Secondary | ICD-10-CM

## 2021-06-18 DIAGNOSIS — I10 Essential (primary) hypertension: Secondary | ICD-10-CM

## 2021-06-18 LAB — CBC
HCT: 38.9 % (ref 36.0–46.0)
Hemoglobin: 12.7 g/dL (ref 12.0–15.0)
MCH: 30 pg (ref 26.0–34.0)
MCHC: 32.6 g/dL (ref 30.0–36.0)
MCV: 92 fL (ref 80.0–100.0)
Platelets: 189 10*3/uL (ref 150–400)
RBC: 4.23 MIL/uL (ref 3.87–5.11)
RDW: 14.6 % (ref 11.5–15.5)
WBC: 14.9 10*3/uL — ABNORMAL HIGH (ref 4.0–10.5)
nRBC: 0 % (ref 0.0–0.2)

## 2021-06-18 LAB — BASIC METABOLIC PANEL
Anion gap: 5 (ref 5–15)
BUN: 20 mg/dL (ref 8–23)
CO2: 23 mmol/L (ref 22–32)
Calcium: 8.9 mg/dL (ref 8.9–10.3)
Chloride: 111 mmol/L (ref 98–111)
Creatinine, Ser: 0.59 mg/dL (ref 0.44–1.00)
GFR, Estimated: >60 mL/min (ref >60)
Glucose, Bld: 245 mg/dL — ABNORMAL HIGH (ref 70–99)
Potassium: 3.9 mmol/L (ref 3.5–5.1)
Sodium: 139 mmol/L (ref 135–145)

## 2021-06-18 LAB — GLUCOSE, CAPILLARY
Glucose-Capillary: 244 mg/dL — ABNORMAL HIGH (ref 70–99)
Glucose-Capillary: 183 mg/dL — ABNORMAL HIGH (ref 70–99)
Glucose-Capillary: 202 mg/dL — ABNORMAL HIGH (ref 70–99)
Glucose-Capillary: 196 mg/dL — ABNORMAL HIGH (ref 70–99)
Glucose-Capillary: 336 mg/dL — ABNORMAL HIGH (ref 70–99)

## 2021-06-18 LAB — PHOSPHORUS: Phosphorus: 2.8 mg/dL (ref 2.5–4.6)

## 2021-06-18 LAB — MAGNESIUM: Magnesium: 1.6 mg/dL — ABNORMAL LOW (ref 1.7–2.4)

## 2021-06-18 MED ORDER — INSULIN GLARGINE-YFGN 100 UNIT/ML ~~LOC~~ SOLN
5.0000 [IU] | Freq: Every day | SUBCUTANEOUS | Status: DC
  Administered 2021-06-18 – 2021-06-20 (×3): 5 [IU] via SUBCUTANEOUS
  Filled 2021-06-18 (×4): qty 0.05

## 2021-06-18 NOTE — Progress Notes (Signed)
PROGRESS NOTE    Teresa [name]  ACZ:[id number] DOB: 1946-01-16 DOA: 06/13/2021 PCP: [name], MD    Brief Narrative:  Teresa [name] was admitted to the hospital with the working diagnosis of syncope.   75 yo female with the past medical history of coronary artery disease, depression, GERD, hypothyroidism, hypertension, scleroderma and sleep apnea who presented after recurrent syncope episodes.  She had 2 episodes a day before hospitalization and 1 on the day of hospitalization.  No prodromal symptoms.  On her initial physical examination her temperature was 98.6, blood pressure 140/118, heart rate 92, respiratory 20, oxygen saturation 95% on room air.  She had dry mucous membranes, her lungs were clear to auscultation bilaterally, heart S1-S2, present, rhythmic, soft abdomen, no lower extremity edema.  Sodium 139, potassium 3.4, chloride 100, bicarb 28, glucose 146, BUN 19, creatinine 0.91, white count 9.7, hemoglobin 15.6, hematocrit 47.6, platelets 238. SARS COVID-19 negative.  Urine analysis specific gravity 1.026, > 500 glucose  Head CT negative for acute changes.  EKG 74 bpm, normal axis, first-degree AV block, right bundle branch block, QTC 481, sinus rhythm, no significant ST segment or T wave changes.  Patient was admitted to the medical ward, She was found to have orthostatic hypotension. Further work-up with brain MRI no acute findings. EEG negative for seizures.  Echocardiography showed preserved LV systolic function.  Her discharge has been delayed due to persistent migraine headaches. Her orthostatic hypotension resolved after volume repletion.  Patient will follow-up as an outpatient for Holter monitor.  Syncope due to orthostatic hypotension. Patient continue to have dizziness with movement, but no further syncope episodes.  On 10/93 systolic blood pressure dropped from 116 lying to 104 standing with HR from 66 to 59.  Headache has improved   Plan to  continue close monitoring of blood pressure. For loop recorder as outpatient with cardiology and tilt table test per Neurology.  Pending transfer to SNF to continue reconditioning. Discontinue telemetry.  Continue with melatonin for insomnia   2. Chronic diastolic heart failure. No clinical signs of exacerbation.  Continue blood pressure monitoring  3. T2DM/ dyslipidemia  Patient tolerating po well, her glucose has been uncontrolled with hyperglycemia. Capillary glucose 357, 247, 244, and 183 Continue with insulin regimen and glimepiride.  Add basal insulin 5 units.   Continue with statin therapy.  Diabetic neuropathy, continue with gabapentin.   4. CAD,  No chest pain or angina  Continue with aspirin and atorvastatin   5. Hypothyroid continue with levothyroxine   6. Hypokalemia, hypomagnesemia stable renal function with serum cr at 0,59, K is 3,9 and serum bicarbonate at 23. Mag is 1,6.  Plan to add 2 g mag sulfate.  Discontinue IV fluids.   7. Obesity class 2, vitamin D and B12 deficiency Continue with nutritional supplements. Calculated BMI is 37.5   8. Anxiety disorder Patient with no confusion or agitation, plan to follow up as outpatient.   9. Gout. No acute flare, continue with allopurinol and cholchicine   10. Scleroderma. Continue with mycophenolate and prophylactic bactrim,   Assessment & Plan:   Principal Problem:   Syncope and collapse Active Problems:   Hypothyroidism   Diabetes mellitus type II, non insulin dependent (HCC)   Anxiety state   Essential hypertension   CAD (coronary artery disease)   Sleep apnea   Chronic diastolic CHF (congestive heart failure) (HCC)   Hypokalemia   Status is: Inpatient  Remains inpatient appropriate because: pending transfer to SNF  DVT prophylaxis: Enoxaparin   Code Status:    full  Family Communication:   No family at the bedside      Consultants:  Cardiology and Neurology    Subjective: Patient  feeling better, no nausea or vomiting and tolerating po well, headache has improved, continue to have dizziness with movement but no syncope or near syncope.   Objective: Vitals:   06/17/21 2018 06/18/21 0411 06/18/21 0500 06/18/21 0806  BP: (!) 106/57 (!) 116/57  125/65  Pulse: 63 (!) 48  (!) 50  Resp: 16 18  18   Temp: 98.2 F (36.8 C) 97.7 F (36.5 C)  97.6 F (36.4 C)  TempSrc:  Oral    SpO2: 95% 96%  97%  Weight:   93 kg     Intake/Output Summary (Last 24 hours) at 06/18/2021 0829 Last data filed at 06/18/2021 0158 Gross per 24 hour  Intake 2087.95 ml  Output 400 ml  Net 1687.95 ml   Filed Weights   06/15/21 0450 06/16/21 0224 06/18/21 0500  Weight: 87.2 kg 89.2 kg 93 kg    Examination:   General: Not in pain or dyspnea/  Neurology: Awake and alert, non focal  E ENT: no pallor, no icterus, oral mucosa moist Cardiovascular: No JVD. S1-S2 present, rhythmic, no gallops, rubs, or murmurs. No lower extremity edema. Pulmonary: positive breath sounds bilaterally, adequate air movement, no wheezing, rhonchi or rales. Gastrointestinal. Abdomen soft and non tender Skin. No rashes Musculoskeletal: no joint deformities     Data Reviewed: I have personally reviewed following labs and imaging studies  CBC: Recent Labs  Lab 06/14/21 0538 06/15/21 0536 06/16/21 0432 06/17/21 0656 06/18/21 0455  WBC 8.2 6.2 13.7* 9.0 14.9*  HGB 14.7 14.2 14.1 13.3 12.7  HCT 44.3 43.8 42.5 40.2 38.9  MCV 92.5 93.6 91.4 93.5 92.0  PLT 205 188 219 194 267   Basic Metabolic Panel: Recent Labs  Lab 06/14/21 0538 06/15/21 0536 06/16/21 0432 06/17/21 0656 06/18/21 0455  NA 140 141 140 142 139  K 3.4* 3.9 3.9 3.9 3.9  CL 103 107 110 114* 111  CO2 27 26 23  21* 23  GLUCOSE 157* 128* 192* 140* 245*  BUN 17 19 16 18 20   CREATININE 0.71 0.56 0.55 0.50 0.59  CALCIUM 9.0 9.0 9.2 8.6* 8.9  MG 1.9 2.2 1.9 1.8 1.6*  PHOS 3.8 3.4 2.7 3.1 2.8   GFR: Estimated Creatinine Clearance: 64.6  mL/min (by C-G formula based on SCr of 0.59 mg/dL). Liver Function Tests: Recent Labs  Lab 06/14/21 0538  AST 29  ALT 23  ALKPHOS 84  BILITOT 0.8  PROT 6.3*  ALBUMIN 3.6   No results for input(s): LIPASE, AMYLASE in the last 168 hours. No results for input(s): AMMONIA in the last 168 hours. Coagulation Profile: No results for input(s): INR, PROTIME in the last 168 hours. Cardiac Enzymes: No results for input(s): CKTOTAL, CKMB, CKMBINDEX, TROPONINI in the last 168 hours. BNP (last 3 results) No results for input(s): PROBNP in the last 8760 hours. HbA1C: No results for input(s): HGBA1C in the last 72 hours. CBG: Recent Labs  Lab 06/17/21 1139 06/17/21 1720 06/17/21 2023 06/18/21 0416 06/18/21 0805  GLUCAP 253* 357* 247* 244* 183*   Lipid Profile: No results for input(s): CHOL, HDL, LDLCALC, TRIG, CHOLHDL, LDLDIRECT in the last 72 hours. Thyroid Function Tests: No results for input(s): TSH, T4TOTAL, FREET4, T3FREE, THYROIDAB in the last 72 hours. Anemia Panel: No results for input(s): VITAMINB12, FOLATE, FERRITIN, TIBC, IRON,  RETICCTPCT in the last 72 hours.    Radiology Studies: I have reviewed all of the imaging during this hospital visit personally     Scheduled Meds:  allopurinol  100 mg Oral Daily   aspirin EC  81 mg Oral Daily   atorvastatin  20 mg Oral Daily   cyanocobalamin  1,000 mcg Intramuscular Daily   Followed by   Derrill Memo ON 06/21/2021] vitamin B-12  500 mcg Oral Daily   enoxaparin (LOVENOX) injection  0.5 mg/kg Subcutaneous Q24H   gabapentin  600 mg Oral TID   glimepiride  4 mg Oral BID   insulin aspart  0-15 Units Subcutaneous TID WC   insulin aspart  0-5 Units Subcutaneous QHS   levothyroxine  88 mcg Oral Q0600   melatonin  5 mg Oral QHS   mycophenolate  250 mg Oral Q12H   sodium chloride flush  3 mL Intravenous Q12H   sulfamethoxazole-trimethoprim  1 tablet Oral Once per day on Mon Wed Fri   Vitamin D (Ergocalciferol)  50,000 Units Oral Q7  days   Continuous Infusions:  sodium chloride 50 mL/hr at 06/18/21 0158     LOS: 3 days        Remingtyn Depaola Gerome Apley, MD

## 2021-06-19 DIAGNOSIS — I5032 Chronic diastolic (congestive) heart failure: Secondary | ICD-10-CM

## 2021-06-19 LAB — GLUCOSE, CAPILLARY
Glucose-Capillary: 119 mg/dL — ABNORMAL HIGH (ref 70–99)
Glucose-Capillary: 216 mg/dL — ABNORMAL HIGH (ref 70–99)
Glucose-Capillary: 122 mg/dL — ABNORMAL HIGH (ref 70–99)
Glucose-Capillary: 144 mg/dL — ABNORMAL HIGH (ref 70–99)
Glucose-Capillary: 198 mg/dL — ABNORMAL HIGH (ref 70–99)

## 2021-06-19 NOTE — Progress Notes (Signed)
PROGRESS NOTE    Teresa [name]  TKW:[id number] DOB: 07-Jan-1946 DOA: 06/13/2021 PCP: [name], MD    Brief Narrative:  Mrs. Meinhardt was admitted to the hospital with the working diagnosis of syncope.  Positive orthostatic hypotension, now pending transfer to SNF   75 yo female with the past medical history of coronary artery disease, depression, GERD, hypothyroidism, hypertension, scleroderma and sleep apnea who presented after recurrent syncope episodes.  She had 2 episodes a day before hospitalization and 1 on the day of hospitalization.  No prodromal symptoms.  On her initial physical examination her temperature was 98.6, blood pressure 140/118, heart rate 92, respiratory 20, oxygen saturation 95% on room air.  She had dry mucous membranes, her lungs were clear to auscultation bilaterally, heart S1-S2, present, rhythmic, soft abdomen, no lower extremity edema.   Sodium 139, potassium 3.4, chloride 100, bicarb 28, glucose 146, BUN 19, creatinine 0.91, white count 9.7, hemoglobin 15.6, hematocrit 47.6, platelets 238. SARS COVID-19 negative.   Urine analysis specific gravity 1.026, > 500 glucose   Head CT negative for acute changes.   EKG 74 bpm, normal axis, first-degree AV block, right bundle branch block, QTC 481, sinus rhythm, no significant ST segment or T wave changes.   Patient was admitted to the medical ward, She was found to have orthostatic hypotension. Further work-up with brain MRI no acute findings. EEG negative for seizures.  Echocardiography showed preserved LV systolic function.   Her discharge has been delayed due to persistent migraine headaches. Her orthostatic hypotension resolved after volume repletion.   Patient will follow-up as an outpatient for Holter monitor.    Assessment & Plan:   Principal Problem:   Syncope and collapse Active Problems:   Hypothyroidism   Diabetes mellitus type II, non insulin dependent (HCC)   Anxiety state    Essential hypertension   CAD (coronary artery disease)   Sleep apnea   Chronic diastolic CHF (congestive heart failure) (HCC)   Hypokalemia   Syncope due to orthostatic hypotension. \ On 92/42 systolic blood pressure dropped from 116 lying to 104 standing with HR from 66 to 59.   Clinically patient feeling better but not yet back to baseline. No further syncope episodes.    Plan for outpatient loop recorder and tilt table testing Pending transfer to SNF.    2. Chronic diastolic heart failure. No signs of acute decompensation. Continue blood pressure monitoring   3. T2DM/ dyslipidemia  Capillary glucose this am 119, 216 ad 122.  Basal insulin 5 units, continue with insulin sliding scale and glimepiride.    On statin therapy.  For diabetic neuropathy, continue with gabapentin with good toleration.    4. CAD,   On aspirin and atorvastatin  Patient with no chest pain or angina.    5. Hypothyroid  On levothyroxine    6. Hypokalemia, hypomagnesemia  Electrolytes have been corrected, patient is tolerating po well, with no nausea or vomiting. Follow up renal function and electrolytes as outpatient.    7. Obesity class 2, vitamin D and B12 deficiency  Calculated BMI is 37.5  Vitamin supplementation    8. Anxiety disorder  Stable with no agitation or confusion.   9. Gout.  On allopurinol and cholchicine  No acute flare.   10. Scleroderma.  On mycophenolate and prophylactic bactrim   Status is: Inpatient  Remains inpatient appropriate because: Pending transfer to SNF   DVT prophylaxis: Enoxaparin   Code Status:    full  Family Communication:  No family at the bedside    Consultants:  Cardiology  Neurology     Subjective: Patient is feeling better but not yet back to baseline, no nausea or vomiting, continue to be very weak and deconditioned, mild dizziness with movement.,   Objective: Vitals:   06/19/21 0500 06/19/21 0528 06/19/21 0726 06/19/21 1150  BP:   (!) 114/51 (!) 148/59 131/74  Pulse:  (!) 51 (!) 52 (!) 53  Resp:  18 15 16   Temp:  97.8 F (36.6 C) 97.7 F (36.5 C) 98 F (36.7 C)  TempSrc:  Oral Oral   SpO2:  95% 98% 98%  Weight: 88.7 kg       Intake/Output Summary (Last 24 hours) at 06/19/2021 1241 Last data filed at 06/18/2021 1413 Gross per 24 hour  Intake 240 ml  Output --  Net 240 ml   Filed Weights   06/16/21 0224 06/18/21 0500 06/19/21 0500  Weight: 89.2 kg 93 kg 88.7 kg    Examination:   General: Not in pain or dyspnea. Deconditioned  Neurology: Awake and alert, non focal  E ENT: no pallor, no icterus, oral mucosa moist Cardiovascular: No JVD. S1-S2 present, rhythmic, no gallops, rubs, or murmurs. No lower extremity edema. Pulmonary: positive breath sounds bilaterally, adequate air movement, no wheezing, rhonchi or rales. Gastrointestinal. Abdomen soft and non tender Skin. No rashes Musculoskeletal: no joint deformities     Data Reviewed: I have personally reviewed following labs and imaging studies  CBC: Recent Labs  Lab 06/14/21 0538 06/15/21 0536 06/16/21 0432 06/17/21 0656 06/18/21 0455  WBC 8.2 6.2 13.7* 9.0 14.9*  HGB 14.7 14.2 14.1 13.3 12.7  HCT 44.3 43.8 42.5 40.2 38.9  MCV 92.5 93.6 91.4 93.5 92.0  PLT 205 188 219 194 433   Basic Metabolic Panel: Recent Labs  Lab 06/14/21 0538 06/15/21 0536 06/16/21 0432 06/17/21 0656 06/18/21 0455  NA 140 141 140 142 139  K 3.4* 3.9 3.9 3.9 3.9  CL 103 107 110 114* 111  CO2 27 26 23  21* 23  GLUCOSE 157* 128* 192* 140* 245*  BUN 17 19 16 18 20   CREATININE 0.71 0.56 0.55 0.50 0.59  CALCIUM 9.0 9.0 9.2 8.6* 8.9  MG 1.9 2.2 1.9 1.8 1.6*  PHOS 3.8 3.4 2.7 3.1 2.8   GFR: Estimated Creatinine Clearance: 62.8 mL/min (by C-G formula based on SCr of 0.59 mg/dL). Liver Function Tests: Recent Labs  Lab 06/14/21 0538  AST 29  ALT 23  ALKPHOS 84  BILITOT 0.8  PROT 6.3*  ALBUMIN 3.6   No results for input(s): LIPASE, AMYLASE in the last  168 hours. No results for input(s): AMMONIA in the last 168 hours. Coagulation Profile: No results for input(s): INR, PROTIME in the last 168 hours. Cardiac Enzymes: No results for input(s): CKTOTAL, CKMB, CKMBINDEX, TROPONINI in the last 168 hours. BNP (last 3 results) No results for input(s): PROBNP in the last 8760 hours. HbA1C: No results for input(s): HGBA1C in the last 72 hours. CBG: Recent Labs  Lab 06/18/21 1610 06/18/21 2016 06/19/21 0528 06/19/21 0843 06/19/21 1151  GLUCAP 196* 336* 119* 216* 122*   Lipid Profile: No results for input(s): CHOL, HDL, LDLCALC, TRIG, CHOLHDL, LDLDIRECT in the last 72 hours. Thyroid Function Tests: No results for input(s): TSH, T4TOTAL, FREET4, T3FREE, THYROIDAB in the last 72 hours. Anemia Panel: No results for input(s): VITAMINB12, FOLATE, FERRITIN, TIBC, IRON, RETICCTPCT in the last 72 hours.    Radiology Studies: I have reviewed all of the  imaging during this hospital visit personally     Scheduled Meds:  allopurinol  100 mg Oral Daily   aspirin EC  81 mg Oral Daily   atorvastatin  20 mg Oral Daily   cyanocobalamin  1,000 mcg Intramuscular Daily   Followed by   Derrill Memo ON 06/21/2021] vitamin B-12  500 mcg Oral Daily   gabapentin  600 mg Oral TID   glimepiride  4 mg Oral BID   insulin aspart  0-15 Units Subcutaneous TID WC   insulin aspart  0-5 Units Subcutaneous QHS   insulin glargine-yfgn  5 Units Subcutaneous Daily   levothyroxine  88 mcg Oral Q0600   melatonin  5 mg Oral QHS   mycophenolate  250 mg Oral Q12H   sodium chloride flush  3 mL Intravenous Q12H   sulfamethoxazole-trimethoprim  1 tablet Oral Once per day on Mon Wed Fri   Vitamin D (Ergocalciferol)  50,000 Units Oral Q7 days   Continuous Infusions:   LOS: 4 days        Teresa Jorden Gerome Apley, MD

## 2021-06-20 DIAGNOSIS — R41841 Cognitive communication deficit: Secondary | ICD-10-CM | POA: Diagnosis not present

## 2021-06-20 DIAGNOSIS — M349 Systemic sclerosis, unspecified: Secondary | ICD-10-CM | POA: Diagnosis not present

## 2021-06-20 DIAGNOSIS — R55 Syncope and collapse: Secondary | ICD-10-CM | POA: Diagnosis not present

## 2021-06-20 DIAGNOSIS — R197 Diarrhea, unspecified: Secondary | ICD-10-CM | POA: Diagnosis not present

## 2021-06-20 DIAGNOSIS — I5032 Chronic diastolic (congestive) heart failure: Secondary | ICD-10-CM | POA: Diagnosis not present

## 2021-06-20 DIAGNOSIS — Z7401 Bed confinement status: Secondary | ICD-10-CM | POA: Diagnosis not present

## 2021-06-20 DIAGNOSIS — I509 Heart failure, unspecified: Secondary | ICD-10-CM | POA: Diagnosis not present

## 2021-06-20 DIAGNOSIS — W19XXXA Unspecified fall, initial encounter: Secondary | ICD-10-CM | POA: Diagnosis not present

## 2021-06-20 DIAGNOSIS — I110 Hypertensive heart disease with heart failure: Secondary | ICD-10-CM | POA: Diagnosis not present

## 2021-06-20 DIAGNOSIS — R002 Palpitations: Secondary | ICD-10-CM | POA: Diagnosis not present

## 2021-06-20 DIAGNOSIS — E1159 Type 2 diabetes mellitus with other circulatory complications: Secondary | ICD-10-CM | POA: Diagnosis not present

## 2021-06-20 DIAGNOSIS — R2689 Other abnormalities of gait and mobility: Secondary | ICD-10-CM | POA: Diagnosis not present

## 2021-06-20 DIAGNOSIS — R112 Nausea with vomiting, unspecified: Secondary | ICD-10-CM | POA: Diagnosis not present

## 2021-06-20 DIAGNOSIS — Z6837 Body mass index (BMI) 37.0-37.9, adult: Secondary | ICD-10-CM | POA: Diagnosis not present

## 2021-06-20 DIAGNOSIS — E1169 Type 2 diabetes mellitus with other specified complication: Secondary | ICD-10-CM | POA: Diagnosis not present

## 2021-06-20 DIAGNOSIS — R5381 Other malaise: Secondary | ICD-10-CM | POA: Diagnosis not present

## 2021-06-20 DIAGNOSIS — E039 Hypothyroidism, unspecified: Secondary | ICD-10-CM | POA: Diagnosis not present

## 2021-06-20 DIAGNOSIS — E119 Type 2 diabetes mellitus without complications: Secondary | ICD-10-CM | POA: Diagnosis not present

## 2021-06-20 DIAGNOSIS — M6281 Muscle weakness (generalized): Secondary | ICD-10-CM | POA: Diagnosis not present

## 2021-06-20 DIAGNOSIS — I5020 Unspecified systolic (congestive) heart failure: Secondary | ICD-10-CM | POA: Diagnosis not present

## 2021-06-20 DIAGNOSIS — G4733 Obstructive sleep apnea (adult) (pediatric): Secondary | ICD-10-CM | POA: Diagnosis not present

## 2021-06-20 LAB — RESP PANEL BY RT-PCR (FLU A&B, COVID) ARPGX2
Influenza A by PCR: NEGATIVE
Influenza B by PCR: NEGATIVE
SARS Coronavirus 2 by RT PCR: NEGATIVE

## 2021-06-20 LAB — GLUCOSE, CAPILLARY
Glucose-Capillary: 158 mg/dL — ABNORMAL HIGH (ref 70–99)
Glucose-Capillary: 142 mg/dL — ABNORMAL HIGH (ref 70–99)
Glucose-Capillary: 160 mg/dL — ABNORMAL HIGH (ref 70–99)

## 2021-06-20 MED ORDER — KETOROLAC TROMETHAMINE 10 MG PO TABS
10.0000 mg | ORAL_TABLET | ORAL | Status: AC
  Administered 2021-06-20: 14:00:00 10 mg via ORAL
  Filled 2021-06-20: qty 1

## 2021-06-20 MED ORDER — CYANOCOBALAMIN 500 MCG PO TABS: 500.0000 ug | ORAL_TABLET | Freq: Every day | ORAL | 0 refills | Status: DC

## 2021-06-20 MED ORDER — GABAPENTIN 600 MG PO TABS: 600.0000 mg | ORAL_TABLET | Freq: Three times a day (TID) | ORAL | 0 refills | Status: DC

## 2021-06-20 MED ORDER — ASPIRIN 81 MG PO TBEC: 81.0000 mg | DELAYED_RELEASE_TABLET | Freq: Every day | ORAL | 0 refills | Status: DC

## 2021-06-20 MED ORDER — VITAMIN D (ERGOCALCIFEROL) 1.25 MG (50000 UNIT) PO CAPS: 50000.0000 [IU] | ORAL_CAPSULE | ORAL | 0 refills | Status: DC

## 2021-06-20 MED ORDER — KETOROLAC TROMETHAMINE 30 MG/ML IJ SOLN
15.0000 mg | INTRAMUSCULAR | Status: DC
  Filled 2021-06-20: qty 1

## 2021-06-20 NOTE — TOC Progression Note (Signed)
Transition of Care [facility]) - Progression Note    Patient Details  Name: Teresa [name] MRN: [id number] Date of Birth: 1945-10-12  Transition of Care [facility] CM/SW Contact  Shelbie Hutching, RN Phone Number: 06/20/2021, 12:24 PM  Clinical Narrative:    5th on the list for EMS pick up.   Expected Discharge Plan: [place] Barriers to Discharge: Barriers Resolved  Expected Discharge Plan and Services Expected Discharge Plan: [place]   Discharge Planning Services: CM Consult Post Acute Care Choice: [place] Living arrangements for the past 2 months: Apartment Expected Discharge Date: 06/20/21               DME Arranged: N/A DME Agency: NA       HH Arranged: NA HH Agency: NA         Social Determinants of Health (SDOH) Interventions    Readmission Risk Interventions No flowsheet data found.

## 2021-06-20 NOTE — TOC Transition Note (Signed)
Transition of Care [facility]) - CM/SW Discharge Note   Patient Details  Name: Teresa [name] MRN: [id number] Date of Birth: [birth year]  Transition of Care [facility]) CM/SW Contact:  Shelbie Hutching, RN Phone Number: 06/20/2021, 12:18 PM   Clinical Narrative:    Patient is medically cleared for discharge to [facility] today.  Patient going to room 222B, bedside RN will call report.  COVID test negative.  RNCM will arrange EMS transport.    Final next level of care: Skilled Nursing Facility Barriers to Discharge: Barriers Resolved   Patient Goals and CMS Choice Patient states their goals for this hospitalization and ongoing recovery are:: Patient wants to feel better and agrees with SNF CMS Medicare.gov Compare Post Acute Care list provided to:: Patient Choice offered to / list presented to : Patient  Discharge Placement              Patient chooses bed at: [facility] Patient to be transferred to facility by: [facility] Name of family member notified: Patient and son Patient and family notified of of transfer: 06/20/21  Discharge Plan and Services   Discharge Planning Services: CM Consult Post Acute Care Choice: [place]          DME Arranged: N/A DME Agency: NA       HH Arranged: NA [place] Agency: NA        Social Determinants of Health (SDOH) Interventions     Readmission Risk Interventions No flowsheet data found.

## 2021-06-20 NOTE — Progress Notes (Addendum)
Patient being discharged to [facility] room 222-B for rehab. IV removed. Attempted to call report to [facility], I was placed on hold then no answer. Patient is being transported via EMS.

## 2021-06-20 NOTE — Care Management Important Message (Signed)
Important Message  Patient Details  Name: LIBERTI APPLETON MRN: [id number] Date of Birth: [birth year]   Medicare Important Message Given:  Yes     Juliann Pulse A Prem Coykendall 06/20/2021, 1:07 PM

## 2021-06-20 NOTE — Discharge Summary (Signed)
Discharge Summary  Teresa [name] MWU:[id number] DOB: [birth year]  PCP: [name], MD  Admit date: 06/13/2021 Discharge date: 06/20/2021  Time spent: 35 minutes  Recommendations for Outpatient Follow-up:  Follow-up with your cardiology for a 30-day Holter monitor, Dr. Clayborn Bigness will arrange. Follow-up with neurology posthospitalization Follow-up with your primary care provider Take your medications as prescribed Continue PT OT with assistance and fall precautions.  Discharge Diagnoses:  Active Hospital Problems   Diagnosis Date Noted   Syncope and collapse 06/13/2021   Hypokalemia 06/13/2021   Chronic diastolic CHF (congestive heart failure) ([place]) 05/16/2019   CAD (coronary artery disease) 06/21/2009   Essential hypertension 06/21/2009   Anxiety state 11/22/2007   Hypothyroidism 11/22/2007   Sleep apnea 11/22/2007   Diabetes mellitus type II, non insulin dependent ([place]) 07/16/2007    Resolved Hospital Problems  No resolved problems to display.    Discharge Condition: Stable  Diet recommendation: Heart healthy carb modified diet.  Vitals:   06/20/21 0526 06/20/21 0749  BP: 112/82 (!) 171/73  Pulse: (!) 56 (!) 53  Resp: 18 16  Temp: 98.2 F (36.8 C) 98.4 F (36.9 C)  SpO2: 97% 96%    History of present illness:   Teresa [name] was admitted to the hospital with the working diagnosis of syncope.  Positive orthostatic hypotension, now pending transfer to SNF   75 yo female with the past medical history of coronary artery disease, depression, GERD, hypothyroidism, hypertension, scleroderma and sleep apnea who presented after recurrent syncope episodes.  She had 2 episodes a day before hospitalization and 1 on the day of hospitalization.  No prodromal symptoms.  On her initial physical examination her temperature was 98.6, blood pressure 140/118, heart rate 92, respiratory 20, oxygen saturation 95% on room air.  She had dry mucous membranes, her lungs were  clear to auscultation bilaterally, heart S1-S2, present, rhythmic, soft abdomen, no lower extremity edema.   Sodium 139, potassium 3.4, chloride 100, bicarb 28, glucose 146, BUN 19, creatinine 0.91, white count 9.7, hemoglobin 15.6, hematocrit 47.6, platelets 238. SARS COVID-19 negative.   Urine analysis specific gravity 1.026, > 500 glucose   Head CT negative for acute changes.   EKG 74 bpm, normal axis, first-degree AV block, right bundle branch block, QTC 481, sinus rhythm, no significant ST segment or T wave changes.   Patient was admitted to the medical ward, She was found to have orthostatic hypotension. Further work-up with brain MRI no acute findings. EEG negative for seizures.  Echocardiography showed preserved LV systolic function.   Her discharge has been delayed due to persistent migraine headaches. Her orthostatic hypotension resolved after volume repletion.   Patient will follow-up as an outpatient for Holter monitor.  Discussed with cardiology, Dr. Clayborn Bigness, on 06/20/21 via secure chat.  He will arrange for 30-day Holter monitor.  06/20/2021: Patient seen at her bedside.  No acute events overnight.  Reports generalized weakness for which she is going to skilled nursing facility for physical rehab.    Hospital Course:  Principal Problem:   Syncope and collapse Active Problems:   Hypothyroidism   Diabetes mellitus type II, non insulin dependent (HCC)   Anxiety state   Essential hypertension   CAD (coronary artery disease)   Sleep apnea   Chronic diastolic CHF (congestive heart failure) (HCC)   Hypokalemia  Syncope due to orthostatic hypotension. \ On 72/53 systolic blood pressure dropped from 116 lying to 104 standing with HR from 66 to 59.    Clinically  patient feeling better but not yet back to baseline. No further syncope episodes.    Plan for outpatient 30-day Holter monitor and possible tilt table testing by neurology.   2. Chronic diastolic heart  failure. No signs of acute decompensation.   3. T2DM/ dyslipidemia  Resume home regimen.  Home pioglitazone held to avoid hypoglycemia. Follow-up with your primary care provider.   4. CAD,   On aspirin and atorvastatin  Patient with no chest pain or angina.    5. Hypothyroid  Resume home regimen.   6. Hypokalemia, hypomagnesemia  Electrolytes have been corrected, patient is tolerating po well, with no nausea or vomiting. Follow up renal function and electrolytes as outpatient.    7. Obesity class 2, vitamin D and B12 deficiency  Calculated BMI is 37.5  Continue vitamin supplementation    8. Anxiety disorder  Stable with no agitation or confusion.   9. Gout.  On allopurinol Home cholchicine held due to interaction with Lipitor. No acute flare.    10. Scleroderma.  On mycophenolate and prophylactic bactrim    Code Status:               full     Consultants:  Cardiology  Neurology     Discharge Exam: BP (!) 171/73 (BP Location: Right Arm)   Pulse (!) 53   Temp 98.4 F (36.9 C) (Oral)   Resp 16   Wt 90 kg   SpO2 96%   BMI 36.29 kg/m  General: 75 y.o. year-old female well developed well nourished in no acute distress.  Alert and oriented x3. Cardiovascular: Regular rate and rhythm with no rubs or gallops.  No thyromegaly or JVD noted.   Musculoskeletal: No lower extremity edema. 2/4 pulses in all 4 extremities. Psychiatry: Mood is appropriate for condition and setting  Discharge Instructions You were cared for by a hospitalist during your hospital stay. If you have any questions about your discharge medications or the care you received while you were in the hospital after you are discharged, you can call the unit and asked to speak with the hospitalist on call if the hospitalist that took care of you is not available. Once you are discharged, your primary care physician will handle any further medical issues. Please note that NO REFILLS for any discharge  medications will be authorized once you are discharged, as it is imperative that you return to your primary care physician (or establish a relationship with a primary care physician if you do not have one) for your aftercare needs so that they can reassess your need for medications and monitor your lab values.  Discharge Instructions     Ambulatory referral to Neurology   Complete by: As directed    An appointment is requested in approximately: 6      Allergies as of 06/20/2021       Reactions   Bacitracin-neomycin-polymyxin    Clarithromycin Other (See Comments), Nausea Only, Nausea And Vomiting   Codeine    Other reaction(s): Hallucination   Dilaudid  [hydromorphone Hcl] Nausea And Vomiting   Iodine    Iohexol     Desc: PT HAS HIVES/ITCHING   Neomycin-bacitracin Zn-polymyx    Tamiflu  [oseltamivir Phosphate]    Other reaction(s): Abdominal Pain, Vomiting   Zolpidem Nausea And Vomiting, Other (See Comments)   Hallucinations   Benzalkonium Chloride Itching, Rash, Swelling   Lidocaine Hcl Itching, Rash, Swelling   Morphine Nausea Only, Rash, Nausea And Vomiting   Tape Itching, Rash  Adhesive tape - silicone   Tapentadol Rash        Medication List     STOP taking these medications    benzonatate 200 MG capsule Commonly known as: TESSALON   budesonide 0.5 MG/2ML nebulizer solution Commonly known as: PULMICORT   colchicine 0.6 MG tablet   pioglitazone 30 MG tablet Commonly known as: ACTOS       TAKE these medications    acetaminophen 500 MG tablet Commonly known as: TYLENOL Take 500 mg by mouth every 6 (six) hours as needed for mild pain or headache.   albuterol 108 (90 Base) MCG/ACT inhaler Commonly known as: VENTOLIN HFA SMARTSIG:2 Inhalation Via Inhaler Every 6 Hours PRN   allopurinol 100 MG tablet Commonly known as: ZYLOPRIM TAKE 1 TABLET BY MOUTH EVERY DAY   aspirin 81 MG EC tablet Take 1 tablet (81 mg total) by mouth daily. Swallow  whole. Start taking on: June 21, 2021   atorvastatin 10 MG tablet Commonly known as: LIPITOR TAKE 1 TABLET BY MOUTH EVERYDAY AT BEDTIME   fluconazole 100 MG tablet Commonly known as: DIFLUCAN Take 100 mg by mouth daily.   FreeStyle Libre 2 Reader [place] 1 each by Does not apply route continuous.   FreeStyle Libre 2 Sensor Misc USE AS DIRECTED   furosemide 40 MG tablet Commonly known as: LASIX TAKE 1 TABLET BY MOUTH EVERY DAY   gabapentin 600 MG tablet Commonly known as: NEURONTIN Take 1 tablet (600 mg total) by mouth 3 (three) times daily for 14 days. What changed:  medication strength how much to take   glimepiride 4 MG tablet Commonly known as: AMARYL TAKE 1 TABLET (4 MG TOTAL) BY MOUTH 2 (TWO) TIMES DAILY.   Jardiance 25 MG Tabs tablet Generic drug: empagliflozin TAKE 25 MG BY MOUTH DAILY.   levothyroxine 88 MCG tablet Commonly known as: SYNTHROID TAKE 1 TABLET BY MOUTH EVERY DAY   metFORMIN 1000 MG tablet Commonly known as: GLUCOPHAGE TAKE 1 TABLET BY MOUTH TWICE A DAY   mycophenolate 250 MG capsule Commonly known as: CELLCEPT Take 250 mg by mouth every 12 (twelve) hours.   nystatin cream Commonly known as: MYCOSTATIN Apply 1 application topically 2 (two) times daily. What changed:  when to take this reasons to take this   ondansetron 4 MG tablet Commonly known as: ZOFRAN TAKE 1 TABLET BY MOUTH EVERY 8 HOURS AS NEEDED FOR NAUSEA AND VOMITING   Ozempic (0.25 or 0.5 MG/DOSE) 2 MG/1.5ML Sopn Generic drug: Semaglutide(0.25 or 0.5MG /DOS) INJECT 0.5MG  INTO THE SKIN ONCE A WEEK.   pantoprazole 20 MG tablet Commonly known as: PROTONIX Take by mouth.   sulfamethoxazole-trimethoprim 400-80 MG tablet Commonly known as: BACTRIM Take 1 tablet by mouth 3 (three) times a week.   vitamin B-12 500 MCG tablet Commonly known as: CYANOCOBALAMIN Take 1 tablet (500 mcg total) by mouth daily. Start taking on: June 21, 2021   Vitamin D (Ergocalciferol)  1.25 MG (50000 UNIT) Caps capsule Commonly known as: DRISDOL Take 1 capsule (50,000 Units total) by mouth every 7 (seven) days. Start taking on: June 21, 2021       Allergies  Allergen Reactions   Bacitracin-Neomycin-Polymyxin    Clarithromycin Other (See Comments), Nausea Only and Nausea And Vomiting   Codeine     Other reaction(s): Hallucination   Dilaudid  [Hydromorphone Hcl] Nausea And Vomiting   Iodine    Iohexol      Desc: PT HAS HIVES/ITCHING    Neomycin-Bacitracin Zn-Polymyx  Tamiflu  [Oseltamivir Phosphate]     Other reaction(s): Abdominal Pain, Vomiting   Zolpidem Nausea And Vomiting and Other (See Comments)    Hallucinations   Benzalkonium Chloride Itching, Rash and Swelling   Lidocaine Hcl Itching, Rash and Swelling   Morphine Nausea Only, Rash and Nausea And Vomiting   Tape Itching and Rash    Adhesive tape - silicone   Tapentadol Rash    Contact information for follow-up providers     [name], MD. Call today.   Specialty: Family Medicine Why: Please call for a post hospital follow-up appointment. Contact information: [address] [place] [address] [phone]         Yolonda Kida, MD. Call today.   Specialties: Cardiology, Internal Medicine Why: Please call for a posthospital follow-up appointment.  Follow-up for 30-day Holter monitor arrangement. Contact information: [place] [address] [phone]         [facility]. Call today.   Why: Please call for a posthospital follow-up appointment Contact information: [address]     [address]  [place] [address] [phone]             Contact information for after-discharge care     Destination     [facility] .   Service: Skilled Nursing Contact information: [address] [place] [phone]                       The results of significant diagnostics from this hospitalization (including imaging, microbiology, ancillary and laboratory) are listed below for reference.    Significant Diagnostic Studies: CT HEAD WO CONTRAST  Result Date: 06/13/2021 CLINICAL DATA:  Dizziness.  Multiple syncopal episodes. EXAM: CT HEAD WITHOUT CONTRAST TECHNIQUE: Contiguous axial images were obtained from the base of the skull through the vertex without intravenous contrast. COMPARISON:  05/14/2014.  Brain MR dated 11/13/2016. FINDINGS: Brain: Mild to moderate patchy white matter low density in both cerebral hemispheres without significant change since 2018. Normal size and position of the ventricles. No intracranial hemorrhage, mass lesion or CT evidence of acute infarction. Vascular: No hyperdense vessel or unexpected calcification. Skull: Normal. Negative for fracture or focal lesion. Sinuses/Orbits: Unremarkable. Other: None. IMPRESSION: 1. No acute abnormality. 2. Stable mild-to-moderate chronic small vessel white matter ischemic changes in both cerebral hemispheres. Electronically Signed   By: Claudie Revering M.D.   On: 06/13/2021 15:16   MR ANGIO HEAD WO CONTRAST  Result Date: 06/15/2021 CLINICAL DATA:  Neuro deficit, acute, stroke suspected; Carotid artery stenosis EXAM: MRA NECK WITHOUT AND WITH CONTRAST MRA HEAD WITHOUT CONTRAST TECHNIQUE: Multiplanar and multiecho pulse sequences of the neck were obtained without and with intravenous contrast. Angiographic images of the neck were obtained using MRA technique without and with intravenous contrast; Angiographic images of the Circle of Willis were obtained using MRA technique without intravenous contrast. CONTRAST:  70mL GADAVIST GADOBUTROL 1 MMOL/ML IV SOLN COMPARISON:  None. FINDINGS: MRA NECK FINDINGS Common, internal, and external carotid arteries are patent. Codominant vertebral arteries are patent. No hemodynamically significant stenosis or evidence of  dissection. MRA HEAD FINDINGS Intracranial internal carotid arteries are patent. Anterior and middle cerebral arteries are patent. Intracranial vertebral arteries are patent. Basilar artery is patent. Posterior cerebral arteries are patent. Bilateral posterior communicating arteries are present. There is no significant stenosis or aneurysm. IMPRESSION: No large vessel occlusion.  No significant stenosis. Electronically Signed  By: Macy Mis M.D.   On: 06/15/2021 14:32   MR ANGIO NECK W WO CONTRAST  Result Date: 06/15/2021 CLINICAL DATA:  Neuro deficit, acute, stroke suspected; Carotid artery stenosis EXAM: MRA NECK WITHOUT AND WITH CONTRAST MRA HEAD WITHOUT CONTRAST TECHNIQUE: Multiplanar and multiecho pulse sequences of the neck were obtained without and with intravenous contrast. Angiographic images of the neck were obtained using MRA technique without and with intravenous contrast; Angiographic images of the Circle of Willis were obtained using MRA technique without intravenous contrast. CONTRAST:  56mL GADAVIST GADOBUTROL 1 MMOL/ML IV SOLN COMPARISON:  None. FINDINGS: MRA NECK FINDINGS Common, internal, and external carotid arteries are patent. Codominant vertebral arteries are patent. No hemodynamically significant stenosis or evidence of dissection. MRA HEAD FINDINGS Intracranial internal carotid arteries are patent. Anterior and middle cerebral arteries are patent. Intracranial vertebral arteries are patent. Basilar artery is patent. Posterior cerebral arteries are patent. Bilateral posterior communicating arteries are present. There is no significant stenosis or aneurysm. IMPRESSION: No large vessel occlusion.  No significant stenosis. Electronically Signed   By: Macy Mis M.D.   On: 06/15/2021 14:32   MR BRAIN WO CONTRAST  Result Date: 06/13/2021 CLINICAL DATA:  Initial evaluation for neuro deficit, stroke suspected. EXAM: MRI HEAD WITHOUT CONTRAST TECHNIQUE: Multiplanar, multiecho pulse  sequences of the brain and surrounding structures were obtained without intravenous contrast. COMPARISON:  Prior head CT from earlier the same day as well as previous MRI from 11/13/2016. FINDINGS: Brain: Mild diffuse prominence of the CSF containing spaces compatible generalized age-related cerebral atrophy. Patchy and confluent T2/FLAIR hyperintensity within the periventricular and deep white matter both cerebral hemispheres most consistent with chronic small vessel ischemic disease, overall moderate in nature. No abnormal foci of restricted diffusion to suggest acute or subacute ischemia. Gray-white matter differentiation maintained. No encephalomalacia to suggest chronic cortical infarction. No foci of susceptibility artifact to suggest acute or chronic intracranial hemorrhage. No mass lesion, midline shift or mass effect. No hydrocephalus or extra-axial fluid collection. Pituitary gland suprasellar region normal. Midline structures intact. Vascular: Major intracranial vascular flow voids are maintained. Skull and upper cervical spine: Craniocervical junction within normal limits. Bone marrow signal intensity normal. No scalp soft tissue abnormality. Sinuses/Orbits: Patient status post bilateral ocular lens replacement. Globes orbital soft tissues demonstrate no acute finding. Paranasal sinuses are largely clear. No significant mastoid effusion. Inner ear structures grossly normal. Other: None. IMPRESSION: 1. No acute intracranial abnormality. 2. Generalized age-related cerebral atrophy with moderate chronic microvascular ischemic disease. Electronically Signed   By: Jeannine Boga M.D.   On: 06/13/2021 20:48   MR BRAIN W WO CONTRAST  Result Date: 06/16/2021 CLINICAL DATA:  TIA.  Seizure with abnormal neuro exam. EXAM: MRI HEAD WITHOUT AND WITH CONTRAST TECHNIQUE: Multiplanar, multiecho pulse sequences of the brain and surrounding structures were obtained without and with intravenous contrast.  CONTRAST:  74mL GADAVIST GADOBUTROL 1 MMOL/ML IV SOLN COMPARISON:  06/13/2021 FINDINGS: Brain: No acute infarction, hemorrhage, hydrocephalus, extra-axial collection or mass lesion. No abnormal enhancement. Moderate chronic small vessel ischemic type change in the hemispheric white matter. No cortical finding to correlate with seizure history. Vascular: Diffusely patent Skull and upper cervical spine: Normal marrow signal Sinuses/Orbits: Negative IMPRESSION: 1. No acute finding or visible seizure focus. 2. Moderate chronic small vessel ischemia. Electronically Signed   By: Jorje Guild M.D.   On: 06/16/2021 08:23   US Carotid Bilateral  Result Date: 06/14/2021 CLINICAL DATA:  Syncope Diabetes Hypertension EXAM: BILATERAL CAROTID DUPLEX ULTRASOUND TECHNIQUE: Pearline Cables  scale imaging, color Doppler and duplex ultrasound were performed of bilateral carotid and vertebral arteries in the neck. COMPARISON:  None. FINDINGS: Criteria: Quantification of carotid stenosis is based on velocity parameters that correlate the residual internal carotid diameter with NASCET-based stenosis levels, using the diameter of the distal internal carotid lumen as the denominator for stenosis measurement. The following velocity measurements were obtained: RIGHT ICA: 57/17 cm/sec CCA: 20/94 cm/sec SYSTOLIC ICA/CCA RATIO:  1.0 ECA: 62 cm/sec LEFT ICA: 54/19 cm/sec CCA: 70/96 cm/sec SYSTOLIC ICA/CCA RATIO:  1.2 ECA: 79 cm/sec RIGHT CAROTID ARTERY: No significant atheromatous plaque of the carotid arteries. RIGHT VERTEBRAL ARTERY:  Antegrade flow. LEFT CAROTID ARTERY: No significant atheromatous plaque of the carotid arteries. LEFT VERTEBRAL ARTERY:  Antegrade flow. IMPRESSION: No significant stenosis of the internal carotid arteries. Electronically Signed   By: Miachel Roux M.D.   On: 06/14/2021 15:58   EEG adult  Result Date: 06/17/2021 Derek Jack, MD     06/17/2021 12:23 PM Routine EEG Report MADISIN HASAN is a 75 y.o. female with  a history of syncope who is undergoing an EEG to evaluate for seizures. Report: This EEG was acquired with electrodes placed according to the International 10-20 electrode system (including Fp1, Fp2, F3, F4, C3, C4, P3, P4, O1, O2, T3, T4, T5, T6, A1, A2, Fz, Cz, Pz). The following electrodes were missing or displaced: none. The occipital dominant rhythm was 9 Hz. This activity is reactive to stimulation. Drowsiness was manifested by background fragmentation; deeper stages of sleep were identified by K complexes and sleep spindles. There was no focal slowing. There were no interictal epileptiform discharges. There were no electrographic seizures identified. There was no abnormal response to photic stimulation or hyperventilation. Impression: This EEG was obtained while awake and asleep and is normal.   Clinical Correlation: Normal EEGs, however, do not rule out epilepsy. Su Monks, MD [facility] [phone] If 7pm- 7am, please page neurology on call as listed in [place].   ECHOCARDIOGRAM COMPLETE  Result Date: 06/14/2021    ECHOCARDIOGRAM REPORT   Patient Name:   CHRISTOL THETFORD Date of Exam: 06/14/2021 Medical Rec #:  [id number]      Height:       62.0 in Accession #:    [id number]     Weight:       195.1 lb Date of Birth:  1945-10-22      BSA:          1.892 m Patient Age:    75 years       BP:           111/63 mmHg Patient Gender: F              HR:           64 bpm. Exam Location:  [facility] Procedure: 2D Echo, Color Doppler and Cardiac Doppler Indications:     R55 Syncope  History:         Patient has no prior history of Echocardiogram examinations.                  Risk Factors:Hypertension, Diabetes and Sleep Apnea.  Sonographer:     Charmayne Sheer Referring Phys:  [place] Diagnosing Phys: Isaias Cowman MD IMPRESSIONS  1. Left ventricular ejection fraction, by estimation, is 60 to 65%. The left ventricle has normal function. The left ventricle has no regional wall motion  abnormalities. Left ventricular diastolic parameters were normal.  2. Right ventricular systolic  function is normal. The right ventricular size is normal.  3. The mitral valve is normal in structure. Trivial mitral valve regurgitation. No evidence of mitral stenosis.  4. The aortic valve is normal in structure. Aortic valve regurgitation is not visualized. No aortic stenosis is present.  5. The inferior vena cava is normal in size with greater than 50% respiratory variability, suggesting right atrial pressure of 3 mmHg. FINDINGS  Left Ventricle: Left ventricular ejection fraction, by estimation, is 60 to 65%. The left ventricle has normal function. The left ventricle has no regional wall motion abnormalities. The left ventricular internal cavity size was normal in size. There is  no left ventricular hypertrophy. Left ventricular diastolic parameters were normal. Right Ventricle: The right ventricular size is normal. No increase in right ventricular wall thickness. Right ventricular systolic function is normal. Left Atrium: Left atrial size was normal in size. Right Atrium: Right atrial size was normal in size. Pericardium: There is no evidence of pericardial effusion. Mitral Valve: The mitral valve is normal in structure. Trivial mitral valve regurgitation. No evidence of mitral valve stenosis. MV peak gradient, 2.6 mmHg. The mean mitral valve gradient is 1.0 mmHg. Tricuspid Valve: The tricuspid valve is normal in structure. Tricuspid valve regurgitation is trivial. No evidence of tricuspid stenosis. Aortic Valve: The aortic valve is normal in structure. Aortic valve regurgitation is not visualized. No aortic stenosis is present. Aortic valve mean gradient measures 4.0 mmHg. Aortic valve peak gradient measures 7.7 mmHg. Aortic valve area, by VTI measures 1.84 cm. Pulmonic Valve: The pulmonic valve was normal in structure. Pulmonic valve regurgitation is not visualized. No evidence of pulmonic stenosis. Aorta: The  aortic root is normal in size and structure. Venous: The inferior vena cava is normal in size with greater than 50% respiratory variability, suggesting right atrial pressure of 3 mmHg. IAS/Shunts: No atrial level shunt detected by color flow Doppler.  LEFT VENTRICLE PLAX 2D LVIDd:         4.30 cm   Diastology LVIDs:         2.80 cm   LV e' medial:    8.81 cm/s LV PW:         0.90 cm   LV E/e' medial:  8.6 LV IVS:        0.80 cm   LV e' lateral:   11.70 cm/s LVOT diam:     1.70 cm   LV E/e' lateral: 6.4 LV SV:         56 LV SV Index:   29 LVOT Area:     2.27 cm  RIGHT VENTRICLE RV Basal diam:  3.30 cm LEFT ATRIUM           Index        RIGHT ATRIUM           Index LA diam:      2.80 cm 1.48 cm/m   RA Area:     11.90 cm LA Vol (A4C): 48.2 ml 25.48 ml/m  RA Volume:   21.90 ml  11.58 ml/m  AORTIC VALVE                    PULMONIC VALVE AV Area (Vmax):    1.71 cm     PV Vmax:       1.00 m/s AV Area (Vmean):   1.79 cm     PV Vmean:      68.100 cm/s AV Area (VTI):     1.84 cm  PV VTI:        0.234 m AV Vmax:           139.00 cm/s  PV Peak grad:  4.0 mmHg AV Vmean:          94.700 cm/s  PV Mean grad:  2.0 mmHg AV VTI:            0.302 m AV Peak Grad:      7.7 mmHg AV Mean Grad:      4.0 mmHg LVOT Vmax:         105.00 cm/s LVOT Vmean:        74.600 cm/s LVOT VTI:          0.245 m LVOT/AV VTI ratio: 0.81  AORTA Ao Root diam: 2.30 cm MITRAL VALVE MV Area (PHT): 4.08 cm    SHUNTS MV Area VTI:   2.54 cm    Systemic VTI:  0.24 m MV Peak grad:  2.6 mmHg    Systemic Diam: 1.70 cm MV Mean grad:  1.0 mmHg MV Vmax:       0.80 m/s MV Vmean:      54.3 cm/s MV Decel Time: 186 msec MV E velocity: 75.40 cm/s MV A velocity: 69.00 cm/s MV E/A ratio:  1.09 Isaias Cowman MD Electronically signed by Isaias Cowman MD Signature Date/Time: 06/14/2021/3:00:54 PM    Final     Microbiology: Recent Results (from the past 240 hour(s))  Resp Panel by RT-PCR (Flu A&B, Covid) Nasopharyngeal Swab     Status: None   Collection  Time: 06/13/21  8:31 PM   Specimen: Nasopharyngeal Swab; Nasopharyngeal(NP) swabs in vial transport medium  Result Value Ref Range Status   SARS Coronavirus 2 by RT PCR NEGATIVE NEGATIVE Final    Comment: (NOTE) SARS-CoV-2 target nucleic acids are NOT DETECTED.  The SARS-CoV-2 RNA is generally detectable in upper respiratory specimens during the acute phase of infection. The lowest concentration of SARS-CoV-2 viral copies this assay can detect is 138 copies/mL. A negative result does not preclude SARS-Cov-2 infection and should not be used as the sole basis for treatment or other patient management decisions. A negative result may occur with  improper specimen collection/handling, submission of specimen other than nasopharyngeal swab, presence of viral mutation(s) within the areas targeted by this assay, and inadequate number of viral copies(<138 copies/mL). A negative result must be combined with clinical observations, patient history, and epidemiological information. The expected result is Negative.  Fact Sheet for Patients:  EntrepreneurPulse.com.au  Fact Sheet for Healthcare Providers:  IncredibleEmployment.be  This test is no t yet approved or cleared by the [place] FDA and  has been authorized for detection and/or diagnosis of SARS-CoV-2 by FDA under an Emergency Use Authorization (EUA). This EUA will remain  in effect (meaning this test can be used) for the duration of the COVID-19 declaration under Section 564(b)(1) of the Act, 21 U.S.C.section 360bbb-3(b)(1), unless the authorization is terminated  or revoked sooner.       Influenza A by PCR NEGATIVE NEGATIVE Final   Influenza B by PCR NEGATIVE NEGATIVE Final    Comment: (NOTE) The Xpert Xpress SARS-CoV-2/FLU/RSV plus assay is intended as an aid in the diagnosis of influenza from Nasopharyngeal swab specimens and should not be used as a sole basis for treatment. Nasal washings  and aspirates are unacceptable for Xpert Xpress SARS-CoV-2/FLU/RSV testing.  Fact Sheet for Patients: EntrepreneurPulse.com.au  Fact Sheet for Healthcare Providers: IncredibleEmployment.be  This test is not yet approved or cleared  by the [place] and has been authorized for detection and/or diagnosis of SARS-CoV-2 by FDA under an Emergency Use Authorization (EUA). This EUA will remain in effect (meaning this test can be used) for the duration of the COVID-19 declaration under Section 564(b)(1) of the Act, 21 U.S.C. section 360bbb-3(b)(1), unless the authorization is terminated or revoked.  Performed at [facility], [address] [place], [place] [address]      Labs: Basic Metabolic Panel: Recent Labs  Lab 06/14/21 0538 06/15/21 0536 06/16/21 0432 06/17/21 0656 06/18/21 0455  NA 140 141 140 142 139  K 3.4* 3.9 3.9 3.9 3.9  CL 103 107 110 114* 111  CO2 27 26 23  21* 23  GLUCOSE 157* 128* 192* 140* 245*  BUN 17 19 16 18 20   CREATININE 0.71 0.56 0.55 0.50 0.59  CALCIUM 9.0 9.0 9.2 8.6* 8.9  MG 1.9 2.2 1.9 1.8 1.6*  PHOS 3.8 3.4 2.7 3.1 2.8   Liver Function Tests: Recent Labs  Lab 06/14/21 0538  AST 29  ALT 23  ALKPHOS 84  BILITOT 0.8  PROT 6.3*  ALBUMIN 3.6   No results for input(s): LIPASE, AMYLASE in the last 168 hours. No results for input(s): AMMONIA in the last 168 hours. CBC: Recent Labs  Lab 06/14/21 0538 06/15/21 0536 06/16/21 0432 06/17/21 0656 06/18/21 0455  WBC 8.2 6.2 13.7* 9.0 14.9*  HGB 14.7 14.2 14.1 13.3 12.7  HCT 44.3 43.8 42.5 40.2 38.9  MCV 92.5 93.6 91.4 93.5 92.0  PLT 205 188 219 194 189   Cardiac Enzymes: No results for input(s): CKTOTAL, CKMB, CKMBINDEX, TROPONINI in the last 168 hours. BNP: BNP (last 3 results) Recent Labs    06/13/21 1440  BNP 18.5    ProBNP (last 3 results) No results for input(s): PROBNP in the last 8760 hours.  CBG: Recent Labs  Lab  06/19/21 1151 06/19/21 1646 06/19/21 2024 06/20/21 0459 06/20/21 0747  GLUCAP 122* 144* 198* 158* 142*       Signed:  Kayleen Memos, MD [facility] 06/20/2021, 10:32 AM

## 2021-06-20 NOTE — Progress Notes (Signed)
Physical Therapy Treatment Patient Details Name: Teresa [name] MRN: [id number] DOB: 10-May-1946 Today's Date: 06/20/2021   History of Present Illness 75 y.o. female with medical history significant of coronary artery disease, depression, GERD, nonspecific heart murmur, hypothyroidism, essential hypertension, scleroderma, obstructive sleep apnea, who has been having recurrent syncopal episodes she had 2 episodes on Saturday 2 episodes on Sunday and one episode today.  It usually happens suddenly with no dizziness.  No prior episodes.  No fever or chills.  She is a diabetic with known coronary artery disease    PT Comments    Patient alert, agreeable to PT. Stated she has been up with assistance today to the bathroom. Pt performed bed mobility with supervision and use of bed rails. Sit <> stand twice, CGA with cues for RW use. Pt does require extra time, momentum, and CGA once up in standing due to instability. Orthostatics performed, see below. The patient was able to ambulate ~48ft with RW and CGA, exhibiting narrow base of support, decreased cadence and gait velocity, some LE tremors due to weakness noted. Pt up in chair, requesting a bath set up at end of session. The patient would benefit from further skilled PT intervention to continue to progress towards goals. Recommendation remains appropriate due to continued deconditioning and ongoing dizziness/HA limiting functional tasks.   orthostatics: seated: 139/ 69 (90) HR 54. standing initial 157/64 (91) HR 59, standing after two minutes 132/95 (108) HR 57    Recommendations for follow up therapy are one component of a multi-disciplinary discharge planning process, led by the attending physician.  Recommendations may be updated based on patient status, additional functional criteria and insurance authorization.  Follow Up Recommendations  Skilled nursing-short term rehab (<3 hours/day)     Assistance Recommended at Discharge Intermittent  Supervision/Assistance  Equipment Recommendations  None recommended by PT    Recommendations for Other Services       Precautions / Restrictions Precautions Precautions: Fall Restrictions Weight Bearing Restrictions: No     Mobility  Bed Mobility Overal bed mobility: Needs Assistance Bed Mobility: Supine to Sit     Supine to sit: Supervision;HOB elevated          Transfers Overall transfer level: Needs assistance Equipment used: Rolling walker (2 wheels) Transfers: Sit to/from Stand Sit to Stand: Min guard           General transfer comment: performed twice, pt needed momentum and CGA due to initial standing balance  deficits. dizzy/HA throughout    Ambulation/Gait Ambulation/Gait assistance: Min guard Gait Distance (Feet): 20 Feet Assistive device: Rolling walker (2 wheels)         General Gait Details: narrow base of support, very decreased cadence/gait velocity, some LE tremors from weakness noted   Stairs             Wheelchair Mobility    Modified Rankin (Stroke Patients Only)       Balance Overall balance assessment: Needs assistance Sitting-balance support: Feet supported Sitting balance-Leahy Scale: Good     Standing balance support: During functional activity;Bilateral upper extremity supported Standing balance-Leahy Scale: Fair Standing balance comment: reliant on RW for ambulation/dynamic tasks today                            Cognition Arousal/Alertness: Awake/alert Behavior During Therapy: WFL for tasks assessed/performed Overall Cognitive Status: Within Functional Limits for tasks assessed  Exercises      General Comments General comments (skin integrity, edema, etc.): orthostatics: seated: 139/ 69 (90) HR 54. standing initial 157/64 (91) HR 59, standing after two minutes 132/95 (108) HR 57      Pertinent Vitals/Pain Pain Score: 9  Pain  Location: headache Pain Descriptors / Indicators: Aching Pain Intervention(s): Monitored during session;Repositioned    Home Living                          Prior Function            PT Goals (current goals can now be found in the care plan section) Progress towards PT goals: Progressing toward goals    Frequency    Min 2X/week      PT Plan Current plan remains appropriate    Co-evaluation              AM-PAC PT "6 Clicks" Mobility   Outcome Measure  Help needed turning from your back to your side while in a flat bed without using bedrails?: None Help needed moving from lying on your back to sitting on the side of a flat bed without using bedrails?: A Little Help needed moving to and from a bed to a chair (including a wheelchair)?: A Little Help needed standing up from a chair using your arms (e.g., wheelchair or bedside chair)?: None Help needed to walk in hospital room?: A Little Help needed climbing 3-5 steps with a railing? : A Lot 6 Click Score: 19    End of Session Equipment Utilized During Treatment: Gait belt Activity Tolerance: Patient tolerated treatment well Patient left: in chair;with call bell/phone within reach;with chair alarm set Nurse Communication: Mobility status PT Visit Diagnosis: Unsteadiness on feet (R26.81);Muscle weakness (generalized) (M62.81);Repeated falls (R29.6);History of falling (Z91.81)     Time: 0962-8366 PT Time Calculation (min) (ACUTE ONLY): 20 min  Charges:  $Therapeutic Exercise: 8-22 mins                    Lieutenant Diego PT, DPT 10:09 AM,06/20/21

## 2021-06-21 DIAGNOSIS — G4733 Obstructive sleep apnea (adult) (pediatric): Secondary | ICD-10-CM | POA: Diagnosis not present

## 2021-06-21 DIAGNOSIS — R55 Syncope and collapse: Secondary | ICD-10-CM | POA: Diagnosis not present

## 2021-06-21 DIAGNOSIS — Z6837 Body mass index (BMI) 37.0-37.9, adult: Secondary | ICD-10-CM | POA: Diagnosis not present

## 2021-06-23 DIAGNOSIS — E1159 Type 2 diabetes mellitus with other circulatory complications: Secondary | ICD-10-CM | POA: Diagnosis not present

## 2021-06-23 DIAGNOSIS — I110 Hypertensive heart disease with heart failure: Secondary | ICD-10-CM | POA: Diagnosis not present

## 2021-06-23 DIAGNOSIS — E039 Hypothyroidism, unspecified: Secondary | ICD-10-CM | POA: Diagnosis not present

## 2021-06-27 ENCOUNTER — Other Ambulatory Visit: Payer: Self-pay | Admitting: Family Medicine

## 2021-06-27 DIAGNOSIS — R112 Nausea with vomiting, unspecified: Secondary | ICD-10-CM | POA: Diagnosis not present

## 2021-06-27 DIAGNOSIS — E1159 Type 2 diabetes mellitus with other circulatory complications: Secondary | ICD-10-CM | POA: Diagnosis not present

## 2021-06-27 DIAGNOSIS — I509 Heart failure, unspecified: Secondary | ICD-10-CM | POA: Diagnosis not present

## 2021-06-27 DIAGNOSIS — E118 Type 2 diabetes mellitus with unspecified complications: Secondary | ICD-10-CM

## 2021-06-27 DIAGNOSIS — R5381 Other malaise: Secondary | ICD-10-CM | POA: Diagnosis not present

## 2021-06-28 DIAGNOSIS — I5032 Chronic diastolic (congestive) heart failure: Secondary | ICD-10-CM | POA: Diagnosis not present

## 2021-06-28 DIAGNOSIS — I509 Heart failure, unspecified: Secondary | ICD-10-CM | POA: Diagnosis not present

## 2021-06-28 DIAGNOSIS — G4733 Obstructive sleep apnea (adult) (pediatric): Secondary | ICD-10-CM | POA: Diagnosis not present

## 2021-06-28 DIAGNOSIS — E039 Hypothyroidism, unspecified: Secondary | ICD-10-CM | POA: Diagnosis not present

## 2021-07-14 DIAGNOSIS — E1169 Type 2 diabetes mellitus with other specified complication: Secondary | ICD-10-CM | POA: Diagnosis not present

## 2021-07-14 DIAGNOSIS — R112 Nausea with vomiting, unspecified: Secondary | ICD-10-CM | POA: Diagnosis not present

## 2021-07-14 DIAGNOSIS — M349 Systemic sclerosis, unspecified: Secondary | ICD-10-CM | POA: Diagnosis not present

## 2021-07-21 ENCOUNTER — Ambulatory Visit: Payer: PPO | Admitting: Podiatry

## 2021-07-25 ENCOUNTER — Other Ambulatory Visit: Payer: Self-pay | Admitting: Family Medicine

## 2021-07-25 DIAGNOSIS — R600 Localized edema: Secondary | ICD-10-CM | POA: Diagnosis not present

## 2021-07-25 DIAGNOSIS — I2510 Atherosclerotic heart disease of native coronary artery without angina pectoris: Secondary | ICD-10-CM | POA: Diagnosis not present

## 2021-07-25 DIAGNOSIS — E119 Type 2 diabetes mellitus without complications: Secondary | ICD-10-CM | POA: Diagnosis not present

## 2021-07-25 DIAGNOSIS — R002 Palpitations: Secondary | ICD-10-CM | POA: Diagnosis not present

## 2021-07-25 DIAGNOSIS — Z8616 Personal history of COVID-19: Secondary | ICD-10-CM | POA: Diagnosis not present

## 2021-07-25 DIAGNOSIS — R0602 Shortness of breath: Secondary | ICD-10-CM | POA: Diagnosis not present

## 2021-07-25 DIAGNOSIS — E782 Mixed hyperlipidemia: Secondary | ICD-10-CM | POA: Diagnosis not present

## 2021-07-25 DIAGNOSIS — R001 Bradycardia, unspecified: Secondary | ICD-10-CM | POA: Diagnosis not present

## 2021-07-25 DIAGNOSIS — R0902 Hypoxemia: Secondary | ICD-10-CM | POA: Diagnosis not present

## 2021-07-25 DIAGNOSIS — K219 Gastro-esophageal reflux disease without esophagitis: Secondary | ICD-10-CM | POA: Diagnosis not present

## 2021-07-25 DIAGNOSIS — I272 Pulmonary hypertension, unspecified: Secondary | ICD-10-CM | POA: Diagnosis not present

## 2021-07-25 DIAGNOSIS — R55 Syncope and collapse: Secondary | ICD-10-CM | POA: Diagnosis not present

## 2021-07-25 NOTE — Telephone Encounter (Signed)
Medication Refill - Medication: furosemide (LASIX) 40 MG tablet  Has the patient contacted their pharmacy? Yes.   (Agent: If no, request that the patient contact the pharmacy for the refill. If patient does not wish to contact the pharmacy document the reason why and proceed with request.) (Agent: If yes, when and what did the pharmacy advise?)  Preferred Pharmacy (with phone number or street name):  [id number] - [place], [address]  [address] [place] [address]  Phone: [phone] Fax: [phone]   Has the patient been seen for an appointment in the last year OR does the patient have an upcoming appointment? Yes.    Agent: Please be advised that RX refills may take up to 3 business days. We ask that you follow-up with your pharmacy.

## 2021-07-26 MED ORDER — FUROSEMIDE 40 MG PO TABS: 40.0000 mg | ORAL_TABLET | Freq: Every day | ORAL | 0 refills | Status: DC

## 2021-07-26 NOTE — Telephone Encounter (Signed)
Requested Prescriptions  Pending Prescriptions Disp Refills   furosemide (LASIX) 40 MG tablet 90 tablet 0    Sig: Take 1 tablet (40 mg total) by mouth daily.     Cardiovascular:  Diuretics - Loop Failed - 07/26/2021  3:22 PM      Failed - Last BP in normal range    BP Readings from Last 1 Encounters:  06/20/21 (!) 157/76         Passed - K in normal range and within 360 days    Potassium  Date Value Ref Range Status  06/18/2021 3.9 3.5 - 5.1 mmol/L Final  05/14/2014 3.7 3.5 - 5.1 mmol/L Final         Passed - Ca in normal range and within 360 days    Calcium  Date Value Ref Range Status  06/18/2021 8.9 8.9 - 10.3 mg/dL Final   Calcium, Total  Date Value Ref Range Status  05/14/2014 9.3 8.5 - 10.1 mg/dL Final         Passed - Na in normal range and within 360 days    Sodium  Date Value Ref Range Status  06/18/2021 139 135 - 145 mmol/L Final  05/16/2021 141 134 - 144 mmol/L Final  05/14/2014 141 136 - 145 mmol/L Final         Passed - Cr in normal range and within 360 days    Creatinine  Date Value Ref Range Status  05/14/2014 0.73 0.60 - 1.30 mg/dL Final   Creatinine, Ser  Date Value Ref Range Status  06/18/2021 0.59 0.44 - 1.00 mg/dL Final         Passed - Valid encounter within last 6 months    Recent Outpatient Visits          2 months ago Type 2 diabetes mellitus with diabetic neuropathy, without long-term current use of insulin [facility])   [facility] [name], MD   6 months ago Type 2 diabetes mellitus with diabetic neuropathy, without long-term current use of insulin [facility])   [facility] [name], MD   9 months ago Encounter for immunization   [facility] [name], MD   10 months ago Type 2 diabetes mellitus with diabetic neuropathy, without long-term current use of insulin [facility])   [facility] [name], MD   1 year ago Need for influenza  vaccination   [facility] [name], MD      Future Appointments            In 2 months [name], MD [facility], [place]

## 2021-07-28 ENCOUNTER — Telehealth: Payer: Self-pay

## 2021-07-28 NOTE — Progress Notes (Signed)
APPOINTMENT REMINDER   Called Teresa [name], No answer, left message of appointment on 08/02/2021 at 3:00 pm via office visit with [name] , Pharm D. Notified to have all medications, supplements, freestyle libre deceive,charger,blood pressure and/or blood sugar logs available during appointment and to return call if need to reschedule.   [place] Pharmacist Assistant [phone]

## 2021-08-02 ENCOUNTER — Telehealth: Payer: PPO

## 2021-08-09 ENCOUNTER — Ambulatory Visit (INDEPENDENT_AMBULATORY_CARE_PROVIDER_SITE_OTHER): Payer: HMO

## 2021-08-09 DIAGNOSIS — E1140 Type 2 diabetes mellitus with diabetic neuropathy, unspecified: Secondary | ICD-10-CM

## 2021-08-09 DIAGNOSIS — I4891 Unspecified atrial fibrillation: Secondary | ICD-10-CM

## 2021-08-09 NOTE — Progress Notes (Signed)
Chronic Care Management Pharmacy Note  08/09/2021 Name:  Teresa [name] MRN:  [id number] DOB:  08/06/1946  Summary: Patient presents for CCM follow-up. She is concerned about worsening memory since her syncopal episode. She was started on Eliquis for suspected A-Fib. -Nursing home misplaced patient's Ozempic, unable to get refilled until 08/29/21. Will see if we can get a sample until then.   Patient interested in [place] for adherence packaging, but did not bring medication bottles to visit, so will discuss next visit.   Recommendations/Changes made from today's visit: Continue current medications for now.   Plan: CPP follow-up 1 month  Recommended Problem List Changes:  Add: Atrial fibrillation, unspecified type ([place])  Subjective: Teresa [name] is an 76 y.o. year old female who is a primary patient of [name], MD.  The CCM team was consulted for assistance with disease management and care coordination needs.    Engaged with patient by telephone for follow up visit in response to provider referral for pharmacy case management and/or care coordination services.   Consent to Services:  The patient was given information about Chronic Care Management services, agreed to services, and gave verbal consent prior to initiation of services.  Please see initial visit note for detailed documentation.   Patient Care Team: [name], MD as PCP - General (Family Medicine) Gardiner Barefoot, DPM as Consulting Physician (Podiatry) Yolonda Kida, MD as Consulting Physician (Cardiology) Vern Claude, [place] as Social Worker Birder Robson, MD as Referring Physician (Ophthalmology) Ottie Glazier, MD as Consulting Physician (Pulmonary Disease) Germaine Pomfret, [facility] as Pharmacist (Pharmacist) Melrose Nakayama, MD as Consulting Physician (Orthopedic Surgery)  Recent office visits: 01/12/21: Patient presented to Dr. Rosanna Randy for follow-up. A1c improved  to 7.3%.  09/13/20:  Patient presented to [facility], LPN for AWV.    Recent consult visits: 07/25/21: Patient presented to Dr. Clayborn Bigness (cardiology) for syncope. Patient started on Eliquis for A-Fib.   Recent hospital visits: 06/13/21: Patient hospitalized due to syncope.   Objective:  Lab Results  Component Value Date   CREATININE 0.59 06/18/2021   BUN 20 06/18/2021   GFRNONAA >60 06/18/2021   GFRAA 86 09/14/2020   NA 139 06/18/2021   K 3.9 06/18/2021   CALCIUM 8.9 06/18/2021   CO2 23 06/18/2021    Lab Results  Component Value Date/Time   HGBA1C 8.2 (H) 05/16/2021 09:40 AM   HGBA1C 7.3 (A) 01/17/2021 05:11 PM   HGBA1C 6.9 (A) 09/14/2020 11:09 AM   HGBA1C 7.4 (H) 09/18/2019 11:21 AM   HGBA1C 7.6 (H) 04/04/2013 10:47 AM   MICROALBUR 50 06/06/2017 11:26 AM   MICROALBUR 20 12/08/2015 10:39 AM    Last diabetic Eye exam:  Lab Results  Component Value Date/Time   HMDIABEYEEXA No Retinopathy 07/07/2020 12:00 AM    Last diabetic Foot exam: No results found for: HMDIABFOOTEX   Lab Results  Component Value Date   CHOL 182 05/16/2021   HDL 49 05/16/2021   LDLCALC 95 05/16/2021   TRIG 225 (H) 05/16/2021   CHOLHDL 3.7 05/16/2021    Hepatic Function Latest Ref Rng & Units 06/14/2021 05/16/2021 09/14/2020  Total Protein 6.5 - 8.1 g/dL 6.3(L) 7.2 7.7  Albumin 3.5 - 5.0 g/dL 3.6 4.8(H) 4.7  AST 15 - 41 U/L '29 24 24  ' ALT 0 - 44 U/L '23 20 25  ' Alk Phosphatase 38 - 126 U/L 84 115 87  Total Bilirubin 0.3 - 1.2 mg/dL 0.8 0.4 0.5  Bilirubin,  Direct 0.0 - 0.2 mg/dL - - -    Lab Results  Component Value Date/Time   TSH 1.450 06/14/2021 12:22 PM   TSH 1.338 06/14/2021 11:28 AM   TSH 1.230 05/16/2021 09:40 AM    CBC Latest Ref Rng & Units 06/18/2021 06/17/2021 06/16/2021  WBC 4.0 - 10.5 K/uL 14.9(H) 9.0 13.7(H)  Hemoglobin 12.0 - 15.0 g/dL 12.7 13.3 14.1  Hematocrit 36.0 - 46.0 % 38.9 40.2 42.5  Platelets 150 - 400 K/uL 189 194 219    Lab Results  Component Value  Date/Time   VD25OH 10.16 (L) 06/14/2021 08:50 AM    Clinical ASCVD: Yes  The 10-year ASCVD risk score (Arnett DK, et al., 2019) is: 41%   Values used to calculate the score:     Age: 76 years     Sex: Female     Is Non-Hispanic African American: No     Diabetic: Yes     Tobacco smoker: No     Systolic Blood Pressure: 660 mmHg     Is BP treated: Yes     HDL Cholesterol: 49 mg/dL     Total Cholesterol: 182 mg/dL    Depression screen [facility] 2/9 05/16/2021 09/13/2020 09/10/2019  Decreased Interest 1 3 0  Down, Depressed, Hopeless 1 3 0  PHQ - 2 Score 2 6 0  Altered sleeping 1 3 -  Tired, decreased energy 1 3 -  Change in appetite 0 3 -  Feeling bad or failure about yourself  0 0 -  Trouble concentrating 0 0 -  Moving slowly or fidgety/restless 0 0 -  Suicidal thoughts 0 0 -  PHQ-9 Score 4 15 -  Difficult doing work/chores Not difficult at all Not difficult at all -  Some recent data might be hidden     Social History   Tobacco Use  Smoking Status Never  Smokeless Tobacco Never   BP Readings from Last 3 Encounters:  06/20/21 (!) 157/76  05/16/21 106/64  01/12/21 113/72   Pulse Readings from Last 3 Encounters:  06/20/21 68  05/16/21 72  01/12/21 60   Wt Readings from Last 3 Encounters:  06/20/21 198 lb 6.6 oz (90 kg)  05/16/21 195 lb (88.5 kg)  01/12/21 193 lb (87.5 kg)    Assessment/Interventions: Review of patient past medical history, allergies, medications, health status, including review of consultants reports, laboratory and other test data, was performed as part of comprehensive evaluation and provision of chronic care management services.   SDOH:  (Social Determinants of Health) assessments and interventions performed: SDOH Interventions    Flowsheet Row Most Recent Value  SDOH Interventions   Financial Strain Interventions Other (Comment)  [LIS]         CCM Care Plan  Allergies  Allergen Reactions   Bacitracin-Neomycin-Polymyxin    Clarithromycin  Other (See Comments), Nausea Only and Nausea And Vomiting   Codeine     Other reaction(s): Hallucination   Dilaudid  [Hydromorphone Hcl] Nausea And Vomiting   Iodine    Iohexol      Desc: PT HAS HIVES/ITCHING    Neomycin-Bacitracin Zn-Polymyx    Tamiflu  [Oseltamivir Phosphate]     Other reaction(s): Abdominal Pain, Vomiting   Zolpidem Nausea And Vomiting and Other (See Comments)    Hallucinations   Benzalkonium Chloride Itching, Rash and Swelling   Lidocaine Hcl Itching, Rash and Swelling   Morphine Nausea Only, Rash and Nausea And Vomiting   Tape Itching and Rash    Adhesive tape -  silicone   Tapentadol Rash    Medications Reviewed Today     Reviewed by Germaine Pomfret, [facility] (Pharmacist) on 08/09/21 at 1328  Med List Status: <None>   Medication Order Taking? Sig Documenting Provider Last Dose Status Informant  acetaminophen (TYLENOL) 500 MG tablet [id number]  Take 500 mg by mouth every 6 (six) hours as needed for mild pain or headache. [provider]  Active Self  albuterol (VENTOLIN HFA) 108 (90 Base) MCG/ACT inhaler [id number]  SMARTSIG:2 Inhalation Via Inhaler Every 6 Hours PRN [provider]  Active Self  allopurinol (ZYLOPRIM) 100 MG tablet [id number]  TAKE 1 TABLET BY MOUTH EVERY DAY [name], MD  Active   apixaban (ELIQUIS) 5 MG TABS tablet [id number] Yes Take 5 mg by mouth daily. [provider]  Active   atorvastatin (LIPITOR) 10 MG tablet [id number]  TAKE 1 TABLET BY MOUTH EVERYDAY AT BEDTIME [name], MD  Active Self  Continuous Blood Gluc Receiver (FREESTYLE LIBRE 2 READER) [place]VI [id number]  1 each by Does not apply route continuous. [name], MD  Active Self  Continuous Blood Gluc Sensor (FREESTYLE [place] 2 SENSOR) [place] [id number]  USE AS DIRECTED [name], MD  Active Self  fluconazole (DIFLUCAN) 100 MG tablet [id number]  Take 100 mg by mouth daily. [provider]  Active Self   furosemide (LASIX) 40 MG tablet [id number]  Take 1 tablet (40 mg total) by mouth daily. [name], MD  Active   gabapentin (NEURONTIN) 600 MG tablet [id number] Yes Take 1 tablet (600 mg total) by mouth 3 (three) times daily for 14 days.  Patient taking differently: Take 800 mg by mouth 3 (three) times daily.   Kayleen Memos, DO Taking Active   glimepiride (AMARYL) 4 MG tablet [id number]  TAKE 1 TABLET BY MOUTH 2 TIMES DAILY. [name], MD  Active   JARDIANCE 25 MG TABS tablet [id number]  TAKE 25 MG BY MOUTH DAILY. [name], MD  Active Self  levothyroxine (SYNTHROID) 88 MCG tablet [id number]  TAKE 1 TABLET BY MOUTH EVERY DAY [name], MD  Active Self  metFORMIN (GLUCOPHAGE) 1000 MG tablet [id number]  TAKE 1 TABLET BY MOUTH TWICE A DAY [name], MD  Active   mycophenolate (CELLCEPT) 250 MG capsule [id number]  Take 250 mg by mouth every 12 (twelve) hours. [provider]  Active Self  nystatin cream (MYCOSTATIN) [id number]  Apply 1 application topically 2 (two) times daily.  Patient taking differently: Apply 1 application topically 2 (two) times daily as needed (for diabetic yeast reactions.).   [name], MD  Active Self  ondansetron [facility]) 4 MG tablet [id number]  TAKE 1 TABLET BY MOUTH EVERY 8 HOURS AS NEEDED FOR NAUSEA AND VOMITING [name], MD  Active Self  OZEMPIC, 0.25 OR 0.5 MG/DOSE, 2 MG/1.5ML SOPN [id number]  INJECT 0.5MG INTO THE SKIN ONCE A WEEK. [name], MD  Active Self           Med Note Delice Bison Jun 13, 2021  8:20 PM) Thursday   pioglitazone (ACTOS) 30 MG tablet [id number]  TAKE 1 TABLET BY MOUTH EVERY DAY [name], MD  Active   sulfamethoxazole-trimethoprim (BACTRIM) 400-80 MG tablet [id number]  Take 1 tablet by mouth 3 (three) times a week. [provider]  Active Self  Med Note Delice Bison Jun 13, 2021  8:20 PM)  Monday, Wednesday, Friday             Patient Active Problem List   Diagnosis Date Noted   Atrial fibrillation ([employer]) 08/09/2021   Syncope and collapse 06/13/2021   Hypokalemia 06/13/2021   Oxygen desaturation 06/24/2019   Encounter for examination following treatment at hospital 06/24/2019   History of 2019 novel coronavirus disease (COVID-19) 06/24/2019   Pneumonia due to COVID-19 virus 05/16/2019   Intractable nausea and vomiting 05/16/2019   Chronic diastolic CHF (congestive heart failure) ([place]) 05/16/2019   Encounter for screening colonoscopy    Benign neoplasm of cecum    Diverticulosis of large intestine without diverticulitis    Tortuous colon    Polyp of sigmoid colon    Intestinal lump    Polyneuropathy associated with underlying disease ([place]) 12/11/2016   Microcalcifications of the breast 12/15/2015   Back pain, chronic 06/28/2015   Chronic airway obstruction ([place]) 06/28/2015   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   Essential hypertension 06/21/2009   CAD (coronary artery disease) 06/21/2009   FEVER UNSPECIFIED 06/21/2009   NAUSEA 06/21/2009   DYSPHAGIA 06/21/2009   RUQ PAIN 06/21/2009   HIATAL HERNIA 01/07/2008   Hypothyroidism 11/22/2007   Anxiety state 11/22/2007   DEPRESSION 11/22/2007   Hypertensive heart disease without heart failure 11/22/2007   ANGINA PECTORIS 11/22/2007   RENAL CALCULUS 11/22/2007   Osteoarthritis 11/22/2007   Sleep apnea 11/22/2007   HEADACHE, CHRONIC 11/22/2007   GASTRITIS 10/23/2007   DIVERTICULOSIS, COLON 10/23/2007   Type 2 diabetes mellitus with diabetic neuropathy, without long-term current use of insulin ([place]) 07/16/2007   HYPERCHOLESTEROLEMIA 07/16/2007   OBESITY, MODERATE 07/16/2007   ISCHEMIC HEART DISEASE 07/16/2007   ESOPHAGEAL REFLUX 07/16/2007   SCLERODERMA 07/16/2007   DYSPNEA 07/16/2007   COUGH, CHRONIC 07/16/2007    Immunization History  Administered Date(s) Administered   Fluad Quad(high Dose 65+)  05/27/2019, 06/01/2020, 05/16/2021   Influenza, High Dose Seasonal PF 05/29/2016, 06/06/2017, 05/20/2018   PFIZER Comirnaty(Gray Top)Covid-19 Tri-Sucrose Vaccine 10/05/2020, 01/12/2021   PFIZER(Purple Top)SARS-COV-2 Vaccination 09/14/2020   Pneumococcal Conjugate-13 05/21/2014   Pneumococcal Polysaccharide-23 11/29/2004, 07/11/2010, 12/08/2015, 05/27/2019   Td 07/05/1994   Tdap 05/11/2011   Zoster, Live 05/07/2008    Conditions to be addressed/monitored: Hypertension, Hyperlipidemia, Diabetes, Atrial Fibrillation, Coronary Artery Disease, Hypothyroidism, Depression, and Anxiety  Care Plan : General Pharmacy (Adult)  Updates made by Germaine Pomfret, RPH since 08/09/2021 12:00 AM     Problem: Hypertension, Hyperlipidemia, Diabetes, Atrial Fibrillation, Coronary Artery Disease, Hypothyroidism, Depression, and Anxiety   Priority: High     Long-Range Goal: Patient-Specific Goal   Start Date: 09/15/2020  Expected End Date: 04/27/2022  This Visit's Progress: On track  Recent Progress: On track  Priority: High  Note:   Current Barriers:  No barriers noted   Pharmacist Clinical Goal(s):  Over the next 90 days, patient will maintain control of Diabetes as evidenced by A1c less than 8%  through collaboration with PharmD and provider.   Interventions: 1:1 collaboration with [name], MD regarding development and update of comprehensive plan of care as evidenced by provider attestation and co-signature Inter-disciplinary care team collaboration (see longitudinal plan of care) Comprehensive medication review performed; medication list updated in electronic medical record  Hypertension (BP goal <140/90) -controlled -Current treatment: Furosemide 40 mg daily  -Medications previously tried: HCTZ  -Current home readings: 120/72, 106/68  -Denies hypotensive/hypertensive symptoms Recommended to continue current  medication  Hyperlipidemia: (LDL goal <  70) -uncontrolled -Current treatment: Atorvastatin 10 mg daily  -Medications previously tried: NA  -Educated on Benefits of statin for ASCVD risk reduction; -Recommended to continue current medication  Atrial Fibrillation (Goal: prevent stroke and major bleeding) -Controlled -CHADSVASC: 5 -Current treatment: Rate control: None Anticoagulation: Eliquis 5 mg twice daily Appropriate, Effective, Safe, Accessible -Counseled on increased risk of stroke due to Afib and benefits of anticoagulation for stroke prevention; -Recommended to continue current medication  Diabetes (A1c goal <8%) -Uncontrolled -Current medications: Glimepiride 4 mg twice daily: Appropriate, Effective, Safe, Accessible Jardiance 25 mg daily: Appropriate, Effective, Safe, Accessible Metformin 1000 mg twice daily: Appropriate, Effective, Safe, Accessible Ozempic 0.5 mg weekly (Thursdays): Appropriate, Effective, Safe, Inaccessible Pioglitazone 30 mg daily: Appropriate, Effective, Safe, Accessible  -Medications previously tried: Januvia  -Nursing home misplaced patient's Ozempic, unable to get refilled until 08/29/21. Will see if we can get a sample until then.  -Current home glucose readings fasting glucose: 170 average  post prandial glucose: 110-120  Target Date(s)  Number of days worn ? 14 days 07/26/21-08/09/21  % of time active ? 70% NA  Mean Glucose (mg/dL)  170  GMI (2-week A1c estimate)  NA  Glycemic Variability (%CV) ?36% NA  Time above >250 mg/dL <10% NA  Time above 70-180 mg/dL <50% 37  Time in range: 70-180 mg/dL >50% 63  Time below 70 mg/dL <1% 0  -Continue current medications  Patient Goals/Self-Care Activities Over the next 90 days, patient will:  - check glucose at least every 8 hours, document, and provide at future appointments  Follow Up Plan: Face to Face appointment with care management team member scheduled for: 09/19/2021 at 9:15 AM     Medication Assistance:  Patient is currently  enrolled in full LIS coverage, so does not qualify for patient assistance applications.  Patient's [facility] pharmacy [id number] [id number]: [phone]: [phone]  Uses pill box? Yes Pt endorses 100% compliance  We discussed: Current pharmacy is preferred with insurance plan and patient is satisfied with pharmacy services Patient decided to: Continue current medication management strategy  Follow Up:  Patient agrees to Care Plan and Follow-up.  Plan: Face to Face appointment with care management team member scheduled for: 09/19/2021 at 9[place] PharmD, [phone]

## 2021-08-09 NOTE — Patient Instructions (Signed)
Visit Information It was great speaking with you today!  Please let me know if you have any questions about our visit.  Plan:  Continue your medications. Bring all your pill bottles with you to your next visit with me.   Print copy of patient instructions, educational materials, and care plan provided in person. Face to Face appointment with pharmacist scheduled for:  09/19/2021 at [place], PharmD, [place], [place] [phone]

## 2021-08-16 DIAGNOSIS — E1142 Type 2 diabetes mellitus with diabetic polyneuropathy: Secondary | ICD-10-CM | POA: Diagnosis not present

## 2021-08-16 DIAGNOSIS — D84821 Immunodeficiency due to drugs: Secondary | ICD-10-CM | POA: Diagnosis not present

## 2021-08-16 DIAGNOSIS — E669 Obesity, unspecified: Secondary | ICD-10-CM | POA: Diagnosis not present

## 2021-08-16 DIAGNOSIS — M349 Systemic sclerosis, unspecified: Secondary | ICD-10-CM | POA: Diagnosis not present

## 2021-08-16 DIAGNOSIS — E1169 Type 2 diabetes mellitus with other specified complication: Secondary | ICD-10-CM | POA: Diagnosis not present

## 2021-08-16 DIAGNOSIS — D6869 Other thrombophilia: Secondary | ICD-10-CM | POA: Diagnosis not present

## 2021-08-16 DIAGNOSIS — E039 Hypothyroidism, unspecified: Secondary | ICD-10-CM | POA: Diagnosis not present

## 2021-08-16 DIAGNOSIS — E785 Hyperlipidemia, unspecified: Secondary | ICD-10-CM | POA: Diagnosis not present

## 2021-08-16 DIAGNOSIS — I4891 Unspecified atrial fibrillation: Secondary | ICD-10-CM | POA: Diagnosis not present

## 2021-08-16 DIAGNOSIS — E559 Vitamin D deficiency, unspecified: Secondary | ICD-10-CM | POA: Diagnosis not present

## 2021-08-16 DIAGNOSIS — G8929 Other chronic pain: Secondary | ICD-10-CM | POA: Diagnosis not present

## 2021-08-16 DIAGNOSIS — J42 Unspecified chronic bronchitis: Secondary | ICD-10-CM | POA: Diagnosis not present

## 2021-08-23 ENCOUNTER — Other Ambulatory Visit: Payer: Self-pay | Admitting: Family Medicine

## 2021-08-23 DIAGNOSIS — E119 Type 2 diabetes mellitus without complications: Secondary | ICD-10-CM

## 2021-08-23 DIAGNOSIS — I69320 Aphasia following cerebral infarction: Secondary | ICD-10-CM | POA: Diagnosis not present

## 2021-08-25 ENCOUNTER — Ambulatory Visit: Payer: HMO | Admitting: Podiatry

## 2021-08-25 ENCOUNTER — Other Ambulatory Visit: Payer: Self-pay

## 2021-08-25 ENCOUNTER — Encounter: Payer: Self-pay | Admitting: Podiatry

## 2021-08-25 DIAGNOSIS — E1142 Type 2 diabetes mellitus with diabetic polyneuropathy: Secondary | ICD-10-CM | POA: Diagnosis not present

## 2021-08-25 DIAGNOSIS — M205X9 Other deformities of toe(s) (acquired), unspecified foot: Secondary | ICD-10-CM | POA: Diagnosis not present

## 2021-08-25 DIAGNOSIS — M79676 Pain in unspecified toe(s): Secondary | ICD-10-CM | POA: Diagnosis not present

## 2021-08-25 DIAGNOSIS — B351 Tinea unguium: Secondary | ICD-10-CM

## 2021-08-25 DIAGNOSIS — D689 Coagulation defect, unspecified: Secondary | ICD-10-CM | POA: Diagnosis not present

## 2021-08-25 NOTE — Progress Notes (Signed)
This patient returns to my office for at risk foot care.  This patient requires this care by a professional since this patient will be at risk due to having  coagulation defect and diabetes.  Patient is taking eliquis. This patient is unable to cut nails herself since the patient cannot reach her nails.These nails are painful walking and wearing shoes.  This patient presents for at risk foot care today.  General Appearance  Alert, conversant and in no acute stress.  Vascular  Dorsalis pedis and posterior tibial  pulses are palpable  bilaterally.  Capillary return is within normal limits  bilaterally. Temperature is within normal limits  bilaterally.  Neurologic  Senn-Weinstein monofilament wire test within normal limits  bilaterally. Muscle power within normal limits bilaterally.  Nails Thick disfigured discolored nails with subungual debris  from hallux to fifth toes bilaterally. No evidence of bacterial infection or drainage bilaterally.  Orthopedic  No limitations of motion  feet .  No crepitus or effusions noted.  No bony pathology or digital deformities noted.Dorsal lipping first metatarsal  B/L. Hallux limitus 1st MPJ right foot.  Skin  normotropic skin with no porokeratosis noted bilaterally.  No signs of infections or ulcers noted.     Onychomycosis  Pain in right toes  Pain in left toes  Consent was obtained for treatment procedures.   Mechanical debridement of nails 1-5  bilaterally performed with a nail nipper.  Filed with dremel without incident.    Return office visit     3 months                Told patient to return for periodic foot care and evaluation due to potential at risk complications.   Gardiner Barefoot DPM

## 2021-09-05 DIAGNOSIS — Z8679 Personal history of other diseases of the circulatory system: Secondary | ICD-10-CM | POA: Diagnosis not present

## 2021-09-05 DIAGNOSIS — E1349 Other specified diabetes mellitus with other diabetic neurological complication: Secondary | ICD-10-CM | POA: Diagnosis not present

## 2021-09-05 DIAGNOSIS — R55 Syncope and collapse: Secondary | ICD-10-CM | POA: Diagnosis not present

## 2021-09-05 DIAGNOSIS — R519 Headache, unspecified: Secondary | ICD-10-CM | POA: Diagnosis not present

## 2021-09-05 DIAGNOSIS — R2689 Other abnormalities of gait and mobility: Secondary | ICD-10-CM | POA: Diagnosis not present

## 2021-09-05 DIAGNOSIS — R42 Dizziness and giddiness: Secondary | ICD-10-CM | POA: Diagnosis not present

## 2021-09-05 DIAGNOSIS — R413 Other amnesia: Secondary | ICD-10-CM | POA: Diagnosis not present

## 2021-09-06 DIAGNOSIS — E785 Hyperlipidemia, unspecified: Secondary | ICD-10-CM | POA: Diagnosis not present

## 2021-09-06 DIAGNOSIS — I10 Essential (primary) hypertension: Secondary | ICD-10-CM | POA: Diagnosis not present

## 2021-09-06 DIAGNOSIS — I4891 Unspecified atrial fibrillation: Secondary | ICD-10-CM

## 2021-09-06 DIAGNOSIS — Z7984 Long term (current) use of oral hypoglycemic drugs: Secondary | ICD-10-CM | POA: Diagnosis not present

## 2021-09-06 DIAGNOSIS — E1140 Type 2 diabetes mellitus with diabetic neuropathy, unspecified: Secondary | ICD-10-CM | POA: Diagnosis not present

## 2021-09-06 DIAGNOSIS — Z7901 Long term (current) use of anticoagulants: Secondary | ICD-10-CM

## 2021-09-06 DIAGNOSIS — Z7985 Long-term (current) use of injectable non-insulin antidiabetic drugs: Secondary | ICD-10-CM

## 2021-09-07 DIAGNOSIS — R569 Unspecified convulsions: Secondary | ICD-10-CM | POA: Diagnosis not present

## 2021-09-12 ENCOUNTER — Telehealth: Payer: Self-pay

## 2021-09-12 NOTE — Progress Notes (Signed)
Per Clinical pharmacist request, Please reach out to patient to see if we can move her appointment to 8:30 on 09/19/2021 instead of 9:15 am.  Patient agreed with changing her office appointment on 09/19/2021 to 8:30 am.  [place] Pharmacist Assistant [phone]

## 2021-09-14 ENCOUNTER — Telehealth: Payer: Self-pay

## 2021-09-14 NOTE — Telephone Encounter (Signed)
Copied from [place] [id number]. Topic: General - Other >> Sep 14, 2021 11:40 AM [place], Nelva Bush wrote: Reason for CRM: Charity from [employer] stated she will be faxing a release form for pt today and wanted to make the PCP aware requesting to have this returned as soon as possible.  Fax - [phone]

## 2021-09-14 NOTE — Telephone Encounter (Signed)
Faxed was received and placed in providers basket.

## 2021-09-16 ENCOUNTER — Telehealth: Payer: Self-pay

## 2021-09-16 NOTE — Progress Notes (Addendum)
Chronic Care Management  APPOINTMENT REMINDER   Called Teresa [name], No answer, left message of appointment on 09/19/2021 at 8:30 am via office visit with [name] , Pharm D. Notified to have all medications, supplements, blood pressure and/or blood sugar logs with [name] deceive charging cable available during appointment and to return call if need to reschedule.  [place] Pharmacist Assistant [phone]

## 2021-09-19 ENCOUNTER — Ambulatory Visit (INDEPENDENT_AMBULATORY_CARE_PROVIDER_SITE_OTHER): Payer: HMO

## 2021-09-19 ENCOUNTER — Other Ambulatory Visit: Payer: Self-pay

## 2021-09-19 VITALS — Ht 62.0 in | Wt 177.7 lb

## 2021-09-19 DIAGNOSIS — I4891 Unspecified atrial fibrillation: Secondary | ICD-10-CM

## 2021-09-19 DIAGNOSIS — Z0000 Encounter for general adult medical examination without abnormal findings: Secondary | ICD-10-CM | POA: Diagnosis not present

## 2021-09-19 DIAGNOSIS — E1140 Type 2 diabetes mellitus with diabetic neuropathy, unspecified: Secondary | ICD-10-CM

## 2021-09-19 DIAGNOSIS — Z780 Asymptomatic menopausal state: Secondary | ICD-10-CM

## 2021-09-19 NOTE — Patient Instructions (Signed)
Teresa [name] , Thank you for taking time to come for your Medicare Wellness Visit. I appreciate your ongoing commitment to your health goals. Please review the following plan we discussed and let me know if I can assist you in the future.   Screening recommendations/referrals: Colonoscopy: 09/27/18 Mammogram: 09/01/20, declined referral, aged out Bone Density: 02/24/08, referral sent Recommended yearly ophthalmology/optometry visit for glaucoma screening and checkup Recommended yearly dental visit for hygiene and checkup  Vaccinations: Influenza vaccine: 05/16/21 Pneumococcal vaccine: 05/27/19 Tdap vaccine: 09/11/21 Shingles vaccine: Zostavax 05/07/08   Covid-19:09/14/20, 10/05/20, 01/12/21, 09/17/21  Advanced directives: no  Conditions/risks identified: none  Next appointment: Follow up in one year for your annual wellness visit 09/20/22 @ 10:20am in person   Preventive Care 65 Years and Older, Female Preventive care refers to lifestyle choices and visits with your health care provider that can promote health and wellness. What does preventive care include? A yearly physical exam. This is also called an annual well check. Dental exams once or twice a year. Routine eye exams. Ask your health care provider how often you should have your eyes checked. Personal lifestyle choices, including: Daily care of your teeth and gums. Regular physical activity. Eating a healthy diet. Avoiding tobacco and drug use. Limiting alcohol use. Practicing safe sex. Taking low-dose aspirin every day. Taking vitamin and mineral supplements as recommended by your health care provider. What happens during an annual well check? The services and screenings done by your health care provider during your annual well check will depend on your age, overall health, lifestyle risk factors, and family history of disease. Counseling  Your health care provider may ask you questions about your: Alcohol use. Tobacco use. Drug  use. Emotional well-being. Home and relationship well-being. Sexual activity. Eating habits. History of falls. Memory and ability to understand (cognition). Work and work Statistician. Reproductive health. Screening  You may have the following tests or measurements: Height, weight, and BMI. Blood pressure. Lipid and cholesterol levels. These may be checked every 5 years, or more frequently if you are over 26 years old. Skin check. Lung cancer screening. You may have this screening every year starting at age 80 if you have a 30-pack-year history of smoking and currently smoke or have quit within the past 15 years. Fecal occult blood test (FOBT) of the stool. You may have this test every year starting at age 74. Flexible sigmoidoscopy or colonoscopy. You may have a sigmoidoscopy every 5 years or a colonoscopy every 10 years starting at age 39. Hepatitis C blood test. Hepatitis B blood test. Sexually transmitted disease (STD) testing. Diabetes screening. This is done by checking your blood sugar (glucose) after you have not eaten for a while (fasting). You may have this done every 1-3 years. Bone density scan. This is done to screen for osteoporosis. You may have this done starting at age 20. Mammogram. This may be done every 1-2 years. Talk to your health care provider about how often you should have regular mammograms. Talk with your health care provider about your test results, treatment options, and if necessary, the need for more tests. Vaccines  Your health care provider may recommend certain vaccines, such as: Influenza vaccine. This is recommended every year. Tetanus, diphtheria, and acellular pertussis (Tdap, Td) vaccine. You may need a Td booster every 10 years. Zoster vaccine. You may need this after age 73. Pneumococcal 13-valent conjugate (PCV13) vaccine. One dose is recommended after age 45. Pneumococcal polysaccharide (PPSV23) vaccine. One dose is recommended after  age  44. Talk to your health care provider about which screenings and vaccines you need and how often you need them. This information is not intended to replace advice given to you by your health care provider. Make sure you discuss any questions you have with your health care provider. Document Released: 08/20/2015 Document Revised: 04/12/2016 Document Reviewed: 05/25/2015 Elsevier Interactive Patient Education  2017 [place] Prevention in the Home Falls can cause injuries. They can happen to people of all ages. There are many things you can do to make your home safe and to help prevent falls. What can I do on the outside of my home? Regularly fix the edges of walkways and driveways and fix any cracks. Remove anything that might make you trip as you walk through a door, such as a raised step or threshold. Trim any bushes or trees on the path to your home. Use bright outdoor lighting. Clear any walking paths of anything that might make someone trip, such as rocks or tools. Regularly check to see if handrails are loose or broken. Make sure that both sides of any steps have handrails. Any raised decks and porches should have guardrails on the edges. Have any leaves, snow, or ice cleared regularly. Use sand or salt on walking paths during winter. Clean up any spills in your garage right away. This includes oil or grease spills. What can I do in the bathroom? Use night lights. Install grab bars by the toilet and in the tub and shower. Do not use towel bars as grab bars. Use non-skid mats or decals in the tub or shower. If you need to sit down in the shower, use a plastic, non-slip stool. Keep the floor dry. Clean up any water that spills on the floor as soon as it happens. Remove soap buildup in the tub or shower regularly. Attach bath mats securely with double-sided non-slip rug tape. Do not have throw rugs and other things on the floor that can make you trip. What can I do in the  bedroom? Use night lights. Make sure that you have a light by your bed that is easy to reach. Do not use any sheets or blankets that are too big for your bed. They should not hang down onto the floor. Have a firm chair that has side arms. You can use this for support while you get dressed. Do not have throw rugs and other things on the floor that can make you trip. What can I do in the kitchen? Clean up any spills right away. Avoid walking on wet floors. Keep items that you use a lot in easy-to-reach places. If you need to reach something above you, use a strong step stool that has a grab bar. Keep electrical cords out of the way. Do not use floor polish or wax that makes floors slippery. If you must use wax, use non-skid floor wax. Do not have throw rugs and other things on the floor that can make you trip. What can I do with my stairs? Do not leave any items on the stairs. Make sure that there are handrails on both sides of the stairs and use them. Fix handrails that are broken or loose. Make sure that handrails are as long as the stairways. Check any carpeting to make sure that it is firmly attached to the stairs. Fix any carpet that is loose or worn. Avoid having throw rugs at the top or bottom of the stairs. If you do  have throw rugs, attach them to the floor with carpet tape. Make sure that you have a light switch at the top of the stairs and the bottom of the stairs. If you do not have them, ask someone to add them for you. What else can I do to help prevent falls? Wear shoes that: Do not have high heels. Have rubber bottoms. Are comfortable and fit you well. Are closed at the toe. Do not wear sandals. If you use a stepladder: Make sure that it is fully opened. Do not climb a closed stepladder. Make sure that both sides of the stepladder are locked into place. Ask someone to hold it for you, if possible. Clearly mark and make sure that you can see: Any grab bars or  handrails. First and last steps. Where the edge of each step is. Use tools that help you move around (mobility aids) if they are needed. These include: Canes. Walkers. Scooters. Crutches. Turn on the lights when you go into a dark area. Replace any light bulbs as soon as they burn out. Set up your furniture so you have a clear path. Avoid moving your furniture around. If any of your floors are uneven, fix them. If there are any pets around you, be aware of where they are. Review your medicines with your doctor. Some medicines can make you feel dizzy. This can increase your chance of falling. Ask your doctor what other things that you can do to help prevent falls. This information is not intended to replace advice given to you by your health care provider. Make sure you discuss any questions you have with your health care provider. Document Released: 05/20/2009 Document Revised: 12/30/2015 Document Reviewed: 08/28/2014 Elsevier Interactive Patient Education  2017 [employer].

## 2021-09-19 NOTE — Progress Notes (Signed)
Subjective:   Teresa [name] is a 76 y.o. female who presents for Medicare Annual (Subsequent) preventive examination.  Review of Systems           Objective:    Today's Vitals   09/19/21 0930  Weight: 177 lb 11.2 oz (80.6 kg)  Height: 5\' 2"  (1.575 m)   Body mass index is 32.5 kg/m.  Advanced Directives 06/13/2021 09/13/2020 09/10/2019 05/17/2019 05/16/2019 09/27/2018 01/19/2017  Does Patient Have a Medical Advance Directive? No Yes Yes Yes Yes Yes Yes  Type of Advance Directive - [place];Living will [place];Living will [place];Living will [place];Living will - Living will;Healthcare Power of Attorney  Does patient want to make changes to medical advance directive? - - - No - Patient declined - - -  Copy of [place] in Chart? - Yes - validated most recent copy scanned in chart (See row information) Yes - validated most recent copy scanned in chart (See row information) No - copy requested No - copy requested - No - copy requested  Would patient like information on creating a medical advance directive? No - Patient declined - - - - - -    Current Medications (verified) Outpatient Encounter Medications as of 09/19/2021  Medication Sig   acetaminophen (TYLENOL) 500 MG tablet Take 500 mg by mouth every 6 (six) hours as needed for mild pain or headache.   albuterol (VENTOLIN HFA) 108 (90 Base) MCG/ACT inhaler SMARTSIG:2 Inhalation Via Inhaler Every 6 Hours PRN   allopurinol (ZYLOPRIM) 100 MG tablet TAKE 1 TABLET BY MOUTH EVERY DAY   apixaban (ELIQUIS) 5 MG TABS tablet Take 5 mg by mouth daily.   atorvastatin (LIPITOR) 10 MG tablet TAKE 1 TABLET BY MOUTH EVERYDAY AT BEDTIME   Continuous Blood Gluc Receiver (FREESTYLE LIBRE 2 READER) DEVI 1 each by Does not apply route continuous.   Continuous Blood Gluc Sensor (FREESTYLE LIBRE 2 SENSOR) MISC USE AS DIRECTED   fluconazole (DIFLUCAN) 100 MG  tablet Take 100 mg by mouth daily.   furosemide (LASIX) 40 MG tablet TAKE 1 TABLET BY MOUTH EVERY DAY   gabapentin (NEURONTIN) 600 MG tablet Take 1 tablet (600 mg total) by mouth 3 (three) times daily for 14 days. (Patient taking differently: Take 800 mg by mouth 3 (three) times daily.)   glimepiride (AMARYL) 4 MG tablet TAKE 1 TABLET BY MOUTH 2 TIMES DAILY.   JARDIANCE 25 MG TABS tablet TAKE 25 MG BY MOUTH DAILY.   levothyroxine (SYNTHROID) 88 MCG tablet TAKE 1 TABLET BY MOUTH EVERY DAY   metFORMIN (GLUCOPHAGE) 1000 MG tablet TAKE 1 TABLET BY MOUTH TWICE A DAY   mycophenolate (CELLCEPT) 250 MG capsule Take 250 mg by mouth every 12 (twelve) hours.   nystatin cream (MYCOSTATIN) Apply 1 application topically 2 (two) times daily. (Patient taking differently: Apply 1 application topically 2 (two) times daily as needed (for diabetic yeast reactions.).)   ondansetron (ZOFRAN) 4 MG tablet TAKE 1 TABLET BY MOUTH EVERY 8 HOURS AS NEEDED FOR NAUSEA AND VOMITING   OZEMPIC, 0.25 OR 0.5 MG/DOSE, 2 MG/1.5ML SOPN INJECT 0.5MG  INTO THE SKIN ONCE A WEEK.   pioglitazone (ACTOS) 30 MG tablet TAKE 1 TABLET BY MOUTH EVERY DAY   predniSONE (DELTASONE) 20 MG tablet Take by mouth.   sulfamethoxazole-trimethoprim (BACTRIM) 400-80 MG tablet Take 1 tablet by mouth 3 (three) times a week.   No facility-administered encounter medications on file as of 09/19/2021.  Allergies (verified) Bacitracin-neomycin-polymyxin, Clarithromycin, Codeine, Dilaudid  [hydromorphone hcl], Iodine, Iohexol, Neomycin-bacitracin zn-polymyx, Tamiflu  [oseltamivir phosphate], Zolpidem, Benzalkonium chloride, Lidocaine hcl, Morphine, Tape, and Tapentadol   History: Past Medical History:  Diagnosis Date   Complication of anesthesia    Depression    Diabetes mellitus without complication (HCC)    Dyspnea    DOE   Edema    FEET/LEGS   GERD (gastroesophageal reflux disease)    Gout    Headache    Heart murmur    History of hiatal hernia     History of orthopnea    Hypertension    Hypothyroidism    PONV (postoperative nausea and vomiting)    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   Sleep apnea    NO CPAP   Thyroid disease    Vertigo    Past Surgical History:  Procedure Laterality Date   ABDOMINAL HYSTERECTOMY     ovaries intact   BLADDER SURGERY     bladder tuck   BREAST BIOPSY Right 03/20/2016   neg x 2 area   CARDIAC CATHETERIZATION     CARPAL TUNNEL RELEASE     CATARACT EXTRACTION W/PHACO Left 09/17/2018   Procedure: CATARACT EXTRACTION PHACO AND INTRAOCULAR LENS PLACEMENT ([place]) LEFT, DIABETIC;  Surgeon: Birder Robson, MD;  Location: [facility] OR[facility];  Service: Ophthalmology;  Laterality: Left;  [place] 00:34 CDE 4.85 Fluid pack lot # [id number]   CATARACT EXTRACTION W/PHACO Right 10/15/2018   Procedure: CATARACT EXTRACTION PHACO AND INTRAOCULAR LENS PLACEMENT ([facility])-RIGHT;  Surgeon: Birder Robson, MD;  Location: [facility];  Service: Ophthalmology;  Laterality: Right;  [place] 00:27.6 CDE 3.43 Fluid Pack Lot # [id number]   COLONOSCOPY WITH PROPOFOL N/A 09/27/2018   Procedure: COLONOSCOPY WITH PROPOFOL;  Surgeon: Virgel Manifold, MD;  Location: [facility] END[facility];  Service: Endoscopy;  Laterality: N/A;   DILATION AND CURETTAGE OF UTERUS     EYE SURGERY     eyelid   FRACTURE SURGERY     left ankle-plate and screws palced   LITHOTRIPSY     NISSEN FUNDOPLICATION     SHOULDER SURGERY Right    TONSILLECTOMY     Family History  Problem Relation Age of Onset   Stroke Mother    Hypertension Mother    Heart disease Mother    Arthritis Mother    Heart disease Father    Hypertension Father    Diabetes Sister    Hypertension Sister    Asthma Sister    Hypertension Sister    Diverticulitis Sister    Colon cancer Maternal Grandmother    Breast cancer Maternal Grandmother    Breast cancer Maternal Aunt    Social History   Socioeconomic History   Marital status: Divorced    Spouse name: Not on file   Number of children: 2   Years  of education: Not on file   Highest education level: Associate degree: occupational, Hotel manager, or vocational program  Occupational History   Occupation: retired  Tobacco Use   Smoking status: Never   Smokeless tobacco: Never  Scientific laboratory technician Use: Never used  Substance and Sexual Activity   Alcohol use: No    Alcohol/week: 0.0 standard drinks   Drug use: Never   Sexual activity: Not on file  Other Topics Concern   Not on file  Social History Narrative   Not on file   Social Determinants of Health   Financial Resource Strain: Medium Risk   Difficulty of Paying Living Expenses: Somewhat  hard  Food Insecurity: Not on file  Transportation Needs: Not on file  Physical Activity: Not on file  Stress: Not on file  Social Connections: Not on file    Tobacco Counseling Counseling given: Not Answered   Clinical Intake:  Pre-visit preparation completed: Yes  Pain : No/denies pain     Nutritional Risks: None Diabetes: No  How often do you need to have someone help you when you read instructions, pamphlets, or other written materials from your doctor or pharmacy?: 1 - Never  Diabetic?yes Nutrition Risk Assessment:  Has the patient had any N/V/D within the last 2 months?  Yes  Does the patient have any non-healing wounds?  No  Has the patient had any unintentional weight loss or weight gain?  No   Diabetes:  Is the patient diabetic?  Yes  If diabetic, was a CBG obtained today?  No  Did the patient bring in their glucometer from home?  No  How often do you monitor your CBG's? Every day.   Financial Strains and Diabetes Management:  Are you having any financial strains with the device, your supplies or your medication? No .  Does the patient want to be seen by Chronic Care Management for management of their diabetes?  No  Would the patient like to be referred to a Nutritionist or for Diabetic Management?  No   Diabetic Exams:  Diabetic Eye Exam: Completed  07/07/20- has to reschedule eye exam. Overdue for diabetic eye exam. Pt has been advised about the importance in completing this exam.   Diabetic Foot Exam: Completed 08/25/21. Pt has been advised about the importance in completing this exam.    Interpreter Needed?: No  Information entered by :: Kirke Shaggy, LPN   Activities of Daily Living In your present state of health, do you have any difficulty performing the following activities: 06/13/2021 05/16/2021  Hearing? N N  Vision? N Y  Difficulty concentrating or making decisions? Tempie Donning  Walking or climbing stairs? Y Y  Dressing or bathing? N N  Doing errands, shopping? N N  Some recent data might be hidden    Patient Care Team: [name], MD as PCP - General (Family Medicine) Gardiner Barefoot, DPM as Consulting Physician (Podiatry) Yolonda Kida, MD as Consulting Physician (Cardiology) Vern Claude, [place] as Social Worker Birder Robson, MD as Referring Physician (Ophthalmology) Ottie Glazier, MD as Consulting Physician (Pulmonary Disease) Germaine Pomfret, [facility] as Pharmacist (Pharmacist) Melrose Nakayama, MD as Consulting Physician (Orthopedic Surgery)  Indicate any recent Medical Services you may have received from other than [facility] providers in the past year (date may be approximate).     Assessment:   This is a routine wellness examination for Larna.  Hearing/Vision screen No results found.  Dietary issues and exercise activities discussed:     Goals Addressed   None    Depression Screen PHQ 2/9 Scores 05/16/2021 09/13/2020 09/10/2019 01/14/2019 09/12/2018 01/19/2017 01/19/2017  PHQ - 2 Score 2 6 0 1 1 0 0  PHQ- 9 Score 4 15 - - 5 4 4     Fall Risk Fall Risk  05/16/2021 09/13/2020 09/10/2019 09/12/2018 05/20/2018  Falls in the past year? 1 1 1 1  Yes  Number falls in past yr: 1 1 1 1 2  or more  Comment - - - - -  Injury with Fall? 0 0 0 0 No  Risk Factor Category  - - - - -  Comment - - - - -  Risk for  fall due to : History of fall(s);Impaired mobility;Impaired balance/gait Other (Comment) Other (Comment) History of fall(s);Impaired balance/gait -  Risk for fall due to: Comment - Due to HR dropping post Covid. Due to low heart rate post covid-19. - -  Follow up Falls evaluation completed Falls prevention discussed Falls prevention discussed Falls evaluation completed;Education provided -  Comment - - - Patient has been being followed by neurology -    FALL RISK PREVENTION PERTAINING TO THE HOME:  Any stairs in or around the home? Yes  If so, are there any without handrails? No  Home free of loose throw rugs in walkways, pet beds, electrical cords, etc? Yes  Adequate lighting in your home to reduce risk of falls? Yes   ASSISTIVE DEVICES UTILIZED TO PREVENT FALLS:  Life alert? No  Use of a cane, walker or w/c? Yes  Grab bars in the bathroom? No  Shower chair or bench in shower? No  Elevated toilet seat or a handicapped toilet? No   TIMED UP AND GO:  Was the test performed? Yes .  Length of time to ambulate 10 feet: 4 sec.   Gait steady and fast without use of assistive device  Cognitive Function:     6CIT Screen 01/19/2017  What Year? 0 points  What month? 0 points  What time? 0 points  Count back from 20 0 points  Months in reverse 0 points  Repeat phrase 0 points  Total Score 0    Immunizations Immunization History  Administered Date(s) Administered   Fluad Quad(high Dose 65+) 05/27/2019, 06/01/2020, 05/16/2021   Influenza, High Dose Seasonal PF 05/29/2016, 06/06/2017, 05/20/2018   PFIZER Comirnaty(Gray Top)Covid-19 Tri-Sucrose Vaccine 10/05/2020, 01/12/2021   PFIZER(Purple Top)SARS-COV-2 Vaccination 09/14/2020   Pneumococcal Conjugate-13 05/21/2014   Pneumococcal Polysaccharide-23 11/29/2004, 07/11/2010, 12/08/2015, 05/27/2019   Td 07/05/1994   Tdap 05/11/2011   Zoster, Live 05/07/2008    TDAP status: Up to date  Flu Vaccine status: Up to  date  Pneumococcal vaccine status: Up to date  Covid-19 vaccine status: Completed vaccines  Qualifies for Shingles Vaccine? Yes   Zostavax completed Yes   Shingrix Completed?: No.    Education has been provided regarding the importance of this vaccine. Patient has been advised to call insurance company to determine out of pocket expense if they have not yet received this vaccine. Advised may also receive vaccine at local pharmacy or Health Dept. Verbalized acceptance and understanding.  Screening Tests Health Maintenance  Topic Date Due   Zoster Vaccines- Shingrix (1 of 2) Never done   URINE MICROALBUMIN  06/06/2018   OPHTHALMOLOGY EXAM  07/07/2021   HEMOGLOBIN A1C  11/14/2021   FOOT EXAM  08/25/2022   COLONOSCOPY (Pts 45-82yrs Insurance coverage will need to be confirmed)  09/28/2023   TETANUS/TDAP  09/12/2031   Pneumonia Vaccine 25+ Years old  Completed   INFLUENZA VACCINE  Completed   DEXA SCAN  Completed   COVID-19 Vaccine  Completed   Hepatitis C Screening  Completed   HPV VACCINES  Aged Out    Health Maintenance  Health Maintenance Due  Topic Date Due   Zoster Vaccines- Shingrix (1 of 2) Never done   URINE MICROALBUMIN  06/06/2018   OPHTHALMOLOGY EXAM  07/07/2021    Colorectal cancer screening: Type of screening: Colonoscopy. Completed 09/27/18. Repeat every 5 years  Mammogram status: Completed 09/01/20. Repeat every year- aged out  Bone Density status: Completed 02/24/08. Results reflect: Bone density results: NORMAL. Repeat every 5 years.  Lung Cancer Screening: (Low Dose CT Chest recommended if Age 53-80 years, 30 pack-year currently smoking OR have quit w/in 15years.) does not qualify.     Additional Screening:  Hepatitis C Screening: does qualify; Completed 12/08/15  Vision Screening: Recommended annual ophthalmology exams for early detection of glaucoma and other disorders of the eye. Is the patient up to date with their annual eye exam?  Yes  Who is the  provider or what is the name of the office in which the patient attends annual eye exams? [facility] If pt is not established with a provider, would they like to be referred to a provider to establish care? No .   Dental Screening: Recommended annual dental exams for proper oral hygiene  Community Resource Referral / Chronic Care Management: CRR required this visit?  No   CCM required this visit?  No      Plan:     I have personally reviewed and noted the following in the patient's chart:   Medical and social history Use of alcohol, tobacco or illicit drugs  Current medications and supplements including opioid prescriptions.  Functional ability and status Nutritional status Physical activity Advanced directives List of other physicians Hospitalizations, surgeries, and ER visits in previous 12 months Vitals Screenings to include cognitive, depression, and falls Referrals and appointments  In addition, I have reviewed and discussed with patient certain preventive protocols, quality metrics, and best practice recommendations. A written personalized care plan for preventive services as well as general preventive health recommendations were provided to patient.     Dionisio David, LPN   [id number]   Nurse Notes: none

## 2021-09-19 NOTE — Progress Notes (Signed)
Chronic Care Management Pharmacy Note  09/19/2021 Name:  Teresa [name] MRN:  [id number] DOB:  10/03/1945  Summary: Patient presents for CCM follow-up.   Recommendations/Changes made from today's visit: Continue current medications for now.  Start utilizing [facility]  Plan: CPP follow-up 3 months  Recommended Problem List Changes:  Add: Atrial fibrillation, unspecified type ([place])  Subjective: Teresa [name] is an 76 y.o. year old female who is a primary patient of [name], MD.  The CCM team was consulted for assistance with disease management and care coordination needs.    Engaged with patient by telephone for follow up visit in response to provider referral for pharmacy case management and/or care coordination services.   Consent to Services:  The patient was given information about Chronic Care Management services, agreed to services, and gave verbal consent prior to initiation of services.  Please see initial visit note for detailed documentation.   Patient Care Team: [name], MD as PCP - General (Family Medicine) Gardiner Barefoot, DPM as Consulting Physician (Podiatry) Yolonda Kida, MD as Consulting Physician (Cardiology) Vern Claude, [place] as Social Worker Birder Robson, MD as Referring Physician (Ophthalmology) Ottie Glazier, MD as Consulting Physician (Pulmonary Disease) Germaine Pomfret, [facility] as Pharmacist (Pharmacist) Melrose Nakayama, MD as Consulting Physician (Orthopedic Surgery)  Recent office visits: 01/12/21: Patient presented to Dr. Rosanna Randy for follow-up. A1c improved to 7.3%.  09/13/20:  Patient presented to [facility], LPN for AWV.    Recent consult visits: 07/25/21: Patient presented to Dr. Clayborn Bigness (cardiology) for syncope. Patient started on Eliquis for A-Fib.   Recent hospital visits: 06/13/21: Patient hospitalized due to syncope.   Objective:  Lab Results  Component Value Date    CREATININE 0.59 06/18/2021   BUN 20 06/18/2021   GFRNONAA >60 06/18/2021   GFRAA 86 09/14/2020   NA 139 06/18/2021   K 3.9 06/18/2021   CALCIUM 8.9 06/18/2021   CO2 23 06/18/2021    Lab Results  Component Value Date/Time   HGBA1C 8.2 (H) 05/16/2021 09:40 AM   HGBA1C 7.3 (A) 01/17/2021 05:11 PM   HGBA1C 6.9 (A) 09/14/2020 11:09 AM   HGBA1C 7.4 (H) 09/18/2019 11:21 AM   HGBA1C 7.6 (H) 04/04/2013 10:47 AM   MICROALBUR 50 06/06/2017 11:26 AM   MICROALBUR 20 12/08/2015 10:39 AM    Last diabetic Eye exam:  Lab Results  Component Value Date/Time   HMDIABEYEEXA No Retinopathy 07/07/2020 12:00 AM    Last diabetic Foot exam: No results found for: HMDIABFOOTEX   Lab Results  Component Value Date   CHOL 182 05/16/2021   HDL 49 05/16/2021   LDLCALC 95 05/16/2021   TRIG 225 (H) 05/16/2021   CHOLHDL 3.7 05/16/2021    Hepatic Function Latest Ref Rng & Units 06/14/2021 05/16/2021 09/14/2020  Total Protein 6.5 - 8.1 g/dL 6.3(L) 7.2 7.7  Albumin 3.5 - 5.0 g/dL 3.6 4.8(H) 4.7  AST 15 - 41 U/L '29 24 24  ' ALT 0 - 44 U/L '23 20 25  ' Alk Phosphatase 38 - 126 U/L 84 115 87  Total Bilirubin 0.3 - 1.2 mg/dL 0.8 0.4 0.5  Bilirubin, Direct 0.0 - 0.2 mg/dL - - -    Lab Results  Component Value Date/Time   TSH 1.450 06/14/2021 12:22 PM   TSH 1.338 06/14/2021 11:28 AM   TSH 1.230 05/16/2021 09:40 AM    CBC Latest Ref Rng & Units 06/18/2021 06/17/2021 06/16/2021  WBC 4.0 - 10.5 K/uL 14.9(H) 9.0  13.7(H)  Hemoglobin 12.0 - 15.0 g/dL 12.7 13.3 14.1  Hematocrit 36.0 - 46.0 % 38.9 40.2 42.5  Platelets 150 - 400 K/uL 189 194 219    Lab Results  Component Value Date/Time   VD25OH 10.16 (L) 06/14/2021 08:50 AM    Clinical ASCVD: Yes  The ASCVD Risk score (Arnett DK, et al., 2019) failed to calculate for the following reasons:   The patient has a prior MI or stroke diagnosis    Depression screen [facility] 2/9 09/19/2021 05/16/2021 09/13/2020  Decreased Interest '1 1 3  ' Down, Depressed, Hopeless '1 1 3   ' PHQ - 2 Score '2 2 6  ' Altered sleeping 0 1 3  Tired, decreased energy 0 1 3  Change in appetite 0 0 3  Feeling bad or failure about yourself  0 0 0  Trouble concentrating 0 0 0  Moving slowly or fidgety/restless 0 0 0  Suicidal thoughts 0 0 0  PHQ-9 Score '2 4 15  ' Difficult doing work/chores Not difficult at all Not difficult at all Not difficult at all  Some recent data might be hidden     Social History   Tobacco Use  Smoking Status Never  Smokeless Tobacco Never   BP Readings from Last 3 Encounters:  06/20/21 (!) 157/76  05/16/21 106/64  01/12/21 113/72   Pulse Readings from Last 3 Encounters:  06/20/21 68  05/16/21 72  01/12/21 60   Wt Readings from Last 3 Encounters:  09/19/21 177 lb 11.2 oz (80.6 kg)  06/20/21 198 lb 6.6 oz (90 kg)  05/16/21 195 lb (88.5 kg)    Assessment/Interventions: Review of patient past medical history, allergies, medications, health status, including review of consultants reports, laboratory and other test data, was performed as part of comprehensive evaluation and provision of chronic care management services.   SDOH:  (Social Determinants of Health) assessments and interventions performed: SDOH Interventions    Flowsheet Row Most Recent Value  SDOH Interventions   Financial Strain Interventions Intervention Not Indicated          CCM Care Plan  Allergies  Allergen Reactions   Bacitracin-Neomycin-Polymyxin    Clarithromycin Other (See Comments), Nausea Only and Nausea And Vomiting   Codeine     Other reaction(s): Hallucination   Dilaudid  [Hydromorphone Hcl] Nausea And Vomiting   Iodine    Iohexol      Desc: PT HAS HIVES/ITCHING    Neomycin-Bacitracin Zn-Polymyx    Tamiflu  [Oseltamivir Phosphate]     Other reaction(s): Abdominal Pain, Vomiting   Zolpidem Nausea And Vomiting and Other (See Comments)    Hallucinations   Benzalkonium Chloride Itching, Rash and Swelling   Lidocaine Hcl Itching, Rash and Swelling    Morphine Nausea Only, Rash and Nausea And Vomiting   Tape Itching and Rash    Adhesive tape - silicone   Tapentadol Rash    Medications Reviewed Today     Reviewed by Dionisio David, LPN (Licensed Practical Nurse) on 09/19/21 at [phone]  Med List Status: <None>   Medication Order Taking? Sig Documenting Provider Last Dose Status Informant  acetaminophen (TYLENOL) 500 MG tablet [id number] No Take 500 mg by mouth every 6 (six) hours as needed for mild pain or headache. [provider] prn unknown Active Self  albuterol (VENTOLIN HFA) 108 (90 Base) MCG/ACT inhaler [id number] No SMARTSIG:2 Inhalation Via Inhaler Every 6 Hours PRN [provider] prn unknown Active Self  allopurinol (ZYLOPRIM) 100 MG tablet [id number]  TAKE 1 TABLET  BY MOUTH EVERY DAY [name], MD  Active   apixaban (ELIQUIS) 5 MG TABS tablet [id number]  Take 5 mg by mouth daily. [provider]  Active   atorvastatin (LIPITOR) 10 MG tablet [id number] No TAKE 1 TABLET BY MOUTH EVERYDAY AT BEDTIME [name], MD 06/12/2021 2100 Active Self  Continuous Blood Gluc Receiver (FREESTYLE LIBRE 2 READER) DEVI [id number] No 1 each by Does not apply route continuous. [name], MD Taking Active Self  Continuous Blood Gluc Sensor ([address] [place] 2 SENSOR) [place] [id number]  USE AS DIRECTED [name], MD  Active Self  fluconazole (DIFLUCAN) 100 MG tablet [id number] No Take 100 mg by mouth daily. [provider] prn unknown Active Self  furosemide (LASIX) 40 MG tablet [id number]  TAKE 1 TABLET BY MOUTH EVERY DAY [name], MD  Active   gabapentin (NEURONTIN) 600 MG tablet [id number] No Take 1 tablet (600 mg total) by mouth 3 (three) times daily for 14 days.  Patient taking differently: Take 800 mg by mouth 3 (three) times daily.   Kayleen Memos, DO Taking Active   glimepiride (AMARYL) 4 MG tablet [id number]  TAKE 1 TABLET BY MOUTH 2 TIMES DAILY. [name], MD  Active   JARDIANCE 25 MG TABS tablet [id number]  TAKE 25 MG BY MOUTH DAILY. [name], MD  Active   levothyroxine (SYNTHROID) 88 MCG tablet [id number]  TAKE 1 TABLET BY MOUTH EVERY DAY [name], MD  Active   metFORMIN (GLUCOPHAGE) 1000 MG tablet [id number]  TAKE 1 TABLET BY MOUTH TWICE A DAY [name], MD  Active   mycophenolate (CELLCEPT) 250 MG capsule [id number] No Take 250 mg by mouth every 12 (twelve) hours. [provider] 06/13/2021 0900 Active Self  nystatin cream (MYCOSTATIN) [id number] No Apply 1 application topically 2 (two) times daily.  Patient taking differently: Apply 1 application topically 2 (two) times daily as needed (for diabetic yeast reactions.).   [name], MD prn unknown Active Self  ondansetron (ZOFRAN) 4 MG tablet [id number] No TAKE 1 TABLET BY MOUTH EVERY 8 HOURS AS NEEDED FOR NAUSEA AND VOMITING [name], MD prn unknown Active Self  OZEMPIC, 0.25 OR 0.5 MG/DOSE, 2 MG/1.5ML SOPN [id number] No INJECT 0.5MG INTO THE SKIN ONCE A WEEK. [name], MD 06/09/2021 Active Self           Med Note Delice Bison Jun 13, 2021  8:20 PM) Thursday   pioglitazone (ACTOS) 30 MG tablet [id number]  TAKE 1 TABLET BY MOUTH EVERY DAY [name], MD  Active   predniSONE (DELTASONE) 20 MG tablet [id number]  Take by mouth. [provider]  Active   sulfamethoxazole-trimethoprim (BACTRIM) 400-80 MG tablet [id number] No Take 1 tablet by mouth 3 (three) times a week. [provider] 06/13/2021 0900 Active Self           Med Note Delice Bison Jun 13, 2021  8:20 PM) Monday, Wednesday, Friday             Patient Active Problem List   Diagnosis Date Noted   Blood clotting disorder ([place]) 08/25/2021   Atrial fibrillation ([place]) 08/09/2021   Syncope and collapse 06/13/2021   Hypokalemia 06/13/2021   Oxygen desaturation 06/24/2019   Encounter for  examination following treatment at hospital 06/24/2019   History of 2019  novel coronavirus disease (COVID-19) 06/24/2019   Pneumonia due to COVID-19 virus 05/16/2019   Intractable nausea and vomiting 05/16/2019   Chronic diastolic CHF (congestive heart failure) ([place]) 05/16/2019   Encounter for screening colonoscopy    Benign neoplasm of cecum    Diverticulosis of large intestine without diverticulitis    Tortuous colon    Polyp of sigmoid colon    Intestinal lump    Polyneuropathy associated with underlying disease ([place]) 12/11/2016   Microcalcifications of the breast 12/15/2015   Back pain, chronic 06/28/2015   Chronic airway obstruction ([place]) 06/28/2015   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   Essential hypertension 06/21/2009   CAD (coronary artery disease) 06/21/2009   FEVER UNSPECIFIED 06/21/2009   NAUSEA 06/21/2009   DYSPHAGIA 06/21/2009   RUQ PAIN 06/21/2009   HIATAL HERNIA 01/07/2008   Hypothyroidism 11/22/2007   Anxiety state 11/22/2007   DEPRESSION 11/22/2007   Hypertensive heart disease without heart failure 11/22/2007   ANGINA PECTORIS 11/22/2007   RENAL CALCULUS 11/22/2007   Osteoarthritis 11/22/2007   Sleep apnea 11/22/2007   HEADACHE, CHRONIC 11/22/2007   GASTRITIS 10/23/2007   DIVERTICULOSIS, COLON 10/23/2007   Type 2 diabetes mellitus with diabetic neuropathy, without long-term current use of insulin ([place]) 07/16/2007   HYPERCHOLESTEROLEMIA 07/16/2007   OBESITY, MODERATE 07/16/2007   ISCHEMIC HEART DISEASE 07/16/2007   ESOPHAGEAL REFLUX 07/16/2007   SCLERODERMA 07/16/2007   DYSPNEA 07/16/2007   COUGH, CHRONIC 07/16/2007    Immunization History  Administered Date(s) Administered   Fluad Quad(high Dose 65+) 05/27/2019, 06/01/2020, 05/16/2021   Influenza, High Dose Seasonal PF 05/29/2016, 06/06/2017, 05/20/2018   PFIZER Comirnaty(Gray Top)Covid-19 Tri-Sucrose Vaccine 10/05/2020, 01/12/2021   PFIZER(Purple Top)SARS-COV-2 Vaccination 09/14/2020    Pneumococcal Conjugate-13 05/21/2014   Pneumococcal Polysaccharide-23 11/29/2004, 07/11/2010, 12/08/2015, 05/27/2019   Td 07/05/1994   Tdap 05/11/2011   Zoster, Live 05/07/2008    Conditions to be addressed/monitored: Hypertension, Hyperlipidemia, Diabetes, Atrial Fibrillation, Coronary Artery Disease, Hypothyroidism, Depression, and Anxiety  Care Plan : General Pharmacy (Adult)  Updates made by Germaine Pomfret, RPH since 09/19/2021 12:00 AM     Problem: Hypertension, Hyperlipidemia, Diabetes, Atrial Fibrillation, Coronary Artery Disease, Hypothyroidism, Depression, and Anxiety   Priority: High     Long-Range Goal: Patient-Specific Goal   Start Date: 09/15/2020  Expected End Date: 04/27/2022  This Visit's Progress: On track  Recent Progress: On track  Priority: High  Note:   Current Barriers:  No barriers noted   Pharmacist Clinical Goal(s):  Over the next 90 days, patient will maintain control of Diabetes as evidenced by A1c less than 8%  through collaboration with PharmD and provider.   Interventions: 1:1 collaboration with [name], MD regarding development and update of comprehensive plan of care as evidenced by provider attestation and co-signature Inter-disciplinary care team collaboration (see longitudinal plan of care) Comprehensive medication review performed; medication list updated in electronic medical record  Hypertension (BP goal <140/90) -controlled -Current treatment: Furosemide 40 mg daily  -Medications previously tried: HCTZ  -Current home readings: 120/72, 106/68  -Denies hypotensive/hypertensive symptoms Recommended to continue current medication  Hyperlipidemia: (LDL goal < 70) -uncontrolled -Current treatment: Atorvastatin 10 mg daily  -Medications previously tried: NA  -Educated on Benefits of statin for ASCVD risk reduction; -Recommended to continue current medication  Atrial Fibrillation (Goal: prevent stroke and major  bleeding) -Controlled -CHADSVASC: 5 -Current treatment: Rate control: None Anticoagulation: Eliquis 5 mg twice daily: Appropriate, Effective, Safe, Accessible -Counseled on increased risk of stroke due to Afib and benefits of anticoagulation  for stroke prevention; -Recommended to continue current medication  Diabetes (A1c goal <8%) -Uncontrolled -Current medications: Glimepiride 4 mg twice daily: Appropriate, Effective, Safe, Accessible Jardiance 25 mg daily: Appropriate, Effective, Safe, Accessible Metformin 1000 mg twice daily: Appropriate, Effective, Safe, Accessible Ozempic 0.5 mg weekly (Thursdays): Appropriate, Effective, Safe, Inaccessible Pioglitazone 30 mg daily: Appropriate, Effective, Safe, Accessible  -Medications previously tried: Januvia  -Current home glucose readings  Target 12/20-1/3 1/31-2/13  % of time active ? 70% NA 87%  Mean Glucose (mg/dL)  170 184  GMI (2-week A1c estimate)  NA 7.7%  Glycemic Variability (%CV) ?36% NA 38.4%  Time above >250 mg/dL <10% NA 19%  Time above 70-180 mg/dL <50% 37 27%  Time in range: 70-180 mg/dL >50% 63 54%  Time below 70 mg/dL <1% 0 0%  -Continue current medications  Patient Goals/Self-Care Activities Over the next 90 days, patient will:  - check glucose at least every 8 hours, document, and provide at future appointments  Follow Up Plan: Face to Face appointment with care management team member scheduled for: 12/12/2021 at 8:30 AM    Medication Assistance:  Patient is currently enrolled in full LIS coverage, so does not qualify for patient assistance applications.  Patient's preferred pharmacy is:  [facility] - [place], [place] - [address] [place] [address] Phone: [phone] Fax: [phone]   Uses pill box? Yes Pt endorses 100% compliance  We discussed: Verbal consent obtained for [facility] enhanced pharmacy services (medication  synchronization, adherence packaging, delivery coordination). A medication sync plan was created to allow patient to get all medications delivered once every 30 to 90 days per patient preference. Patient understands they have freedom to choose pharmacy and clinical pharmacist will coordinate care between all prescribers and [facility]. Patient decided to: Utilize [facility] for medication synchronization, packaging and delivery  Follow Up:  Patient agrees to Care Plan and Follow-up.  Plan: Face to Face appointment with care management team member scheduled for: 12/12/2021 at 8:30 AM  [name], PharmD, [place], [place] [phone]

## 2021-09-19 NOTE — Patient Instructions (Addendum)
Visit Information It was great speaking with you today!  Please let me know if you have any questions about our visit.   Goals Addressed             This Visit's Progress    Monitor and Manage My Blood Sugar-Diabetes Type 2   On track    Timeframe:  Long-Range Goal Priority:  High Start Date:       09/15/2020                      Expected End Date:  03/15/2022                  Follow Up within 30 days  - check blood sugar 5-10 times daily  - check blood sugar if I feel it is too high or too low - take the blood sugar meter to all doctor visits    Why is this important?   Checking your blood sugar at home helps to keep it from getting very high or very low.  Writing the results in a diary or log helps the doctor know how to care for you.  Your blood sugar log should have the time, date and the results.  Also, write down the amount of insulin or other medicine that you take.  Other information, like what you ate, exercise done and how you were feeling, will also be helpful.     Notes:         Patient Care Plan: General Pharmacy (Adult)     Problem Identified: Hypertension, Hyperlipidemia, Diabetes, Atrial Fibrillation, Coronary Artery Disease, Hypothyroidism, Depression, and Anxiety   Priority: High     Long-Range Goal: Patient-Specific Goal   Start Date: 09/15/2020  Expected End Date: 04/27/2022  This Visit's Progress: On track  Recent Progress: On track  Priority: High  Note:   Current Barriers:  No barriers noted   Pharmacist Clinical Goal(s):  Over the next 90 days, patient will maintain control of Diabetes as evidenced by A1c less than 8%  through collaboration with PharmD and provider.   Interventions: 1:1 collaboration with [name], MD regarding development and update of comprehensive plan of care as evidenced by provider attestation and co-signature Inter-disciplinary care team collaboration (see longitudinal plan of care) Comprehensive  medication review performed; medication list updated in electronic medical record  Hypertension (BP goal <140/90) -controlled -Current treatment: Furosemide 40 mg daily  -Medications previously tried: HCTZ  -Current home readings: 120/72, 106/68  -Denies hypotensive/hypertensive symptoms Recommended to continue current medication  Hyperlipidemia: (LDL goal < 70) -uncontrolled -Current treatment: Atorvastatin 10 mg daily  -Medications previously tried: NA  -Educated on Benefits of statin for ASCVD risk reduction; -Recommended to continue current medication  Atrial Fibrillation (Goal: prevent stroke and major bleeding) -Controlled -CHADSVASC: 5 -Current treatment: Rate control: None Anticoagulation: Eliquis 5 mg twice daily: Appropriate, Effective, Safe, Accessible -Counseled on increased risk of stroke due to Afib and benefits of anticoagulation for stroke prevention; -Recommended to continue current medication  Diabetes (A1c goal <8%) -Uncontrolled -Current medications: Glimepiride 4 mg twice daily: Appropriate, Effective, Safe, Accessible Jardiance 25 mg daily: Appropriate, Effective, Safe, Accessible Metformin 1000 mg twice daily: Appropriate, Effective, Safe, Accessible Ozempic 0.5 mg weekly (Thursdays): Appropriate, Effective, Safe, Inaccessible Pioglitazone 30 mg daily: Appropriate, Effective, Safe, Accessible  -Medications previously tried: Januvia  -Current home glucose readings  Target 12/20-1/3 1/31-2/13  % of time active ? 70% NA 87%  Mean Glucose (mg/dL)  170  184  GMI (2-week A1c estimate)  NA 7.7%  Glycemic Variability (%CV) ?36% NA 38.4%  Time above >250 mg/dL <10% NA 19%  Time above 70-180 mg/dL <50% 37 27%  Time in range: 70-180 mg/dL >50% 63 54%  Time below 70 mg/dL <1% 0 0%  -Continue current medications  Patient Goals/Self-Care Activities Over the next 90 days, patient will:  - check glucose at least every 8 hours, document, and provide at future  appointments  Follow Up Plan: Face to Face appointment with care management team member scheduled for: 12/12/2021 at 8:30 AM    Verbal consent obtained for [facility] enhanced pharmacy services (medication synchronization, adherence packaging, delivery coordination). A medication sync plan was created to allow patient to get all medications delivered once every 30 to 90 days per patient preference. Patient understands they have freedom to choose pharmacy and clinical pharmacist will coordinate care between all prescribers and [facility].  Patient agreed to services and verbal consent obtained.   Patient verbalizes understanding of instructions and care plan provided today and agrees to view in [place]. Active MyChart status confirmed with patient.    [name], PharmD, [place], CPP  Clinical Pharmacist Practitioner  [facility] [phone]

## 2021-09-20 ENCOUNTER — Telehealth: Payer: Self-pay

## 2021-09-20 NOTE — Progress Notes (Addendum)
I received a task from [name], CPP requesting to complete a on boarding form for patient to utilize [facility] for medication synchronization.    I reach out to [facility] to request [facility]21.Per [facility] they will send the profile transfer later this morning.  I reach out to patient Cardiologist to request a refill for Eliqus [facility].Per Cardiology, patient has to call and request the change of pharmacies because they are having a lot of pharmacies calling requesting the change.Then the patient will call and will request not to change.Inform Clinical Pharmacist.  I left a voice message informing patient to reach out to her Cardiology and request a refill of Eliquis [facility].  09/21/2021: Patient Confirm she reach out to her Cardiologist to request a refill of Eliquis [facility]21, and needs a refill by Friday 09/23/2021. Notified Clinical pharmacist.  [place] Clinical Pharmacist Assistant [phone]

## 2021-09-21 ENCOUNTER — Telehealth: Payer: Self-pay | Admitting: Family Medicine

## 2021-09-21 NOTE — Progress Notes (Signed)
I reach out to [facility] again to request a profile [facility] on [facility]/24/2021.Per [facility], she sent the profile transfer yesterday at 1:00 pm then she confirm the fax with me which was incorrect.[facility]  is going to resend the profile transfer again to the correct number [phone].Per [facility], give it 20 minutes , and if [facility] does not receive anything then something may be wrong with our Fax.   [place] Pharmacist Assistant [phone]

## 2021-09-21 NOTE — Telephone Encounter (Signed)
[place], called from providers office, (didn't say the name of it), medical clearance was faxed 02/10, she is calling to see if this was received

## 2021-09-23 ENCOUNTER — Other Ambulatory Visit: Payer: Self-pay | Admitting: Family Medicine

## 2021-09-26 ENCOUNTER — Ambulatory Visit: Payer: PPO | Admitting: Family Medicine

## 2021-09-27 DIAGNOSIS — R569 Unspecified convulsions: Secondary | ICD-10-CM | POA: Insufficient documentation

## 2021-09-30 NOTE — Telephone Encounter (Signed)
Returned call to [place]. Have not seen this fax. LM for Charity to cb.

## 2021-09-30 NOTE — Telephone Encounter (Signed)
Charity returned call. She is re-faxing form to office.

## 2021-10-04 DIAGNOSIS — E1140 Type 2 diabetes mellitus with diabetic neuropathy, unspecified: Secondary | ICD-10-CM

## 2021-10-04 DIAGNOSIS — I4891 Unspecified atrial fibrillation: Secondary | ICD-10-CM | POA: Diagnosis not present

## 2021-10-10 NOTE — Telephone Encounter (Signed)
Received fax and gave it to provider for review. ?

## 2021-10-12 ENCOUNTER — Ambulatory Visit
Admission: RE | Admit: 2021-10-12 | Discharge: 2021-10-12 | Disposition: A | Discharge status: Home / Self Care | Payer: HMO | Source: Ambulatory Visit | Attending: Family Medicine | Admitting: Family Medicine

## 2021-10-12 ENCOUNTER — Encounter: Payer: Self-pay | Admitting: Family Medicine

## 2021-10-12 ENCOUNTER — Ambulatory Visit (INDEPENDENT_AMBULATORY_CARE_PROVIDER_SITE_OTHER): Payer: HMO | Admitting: Family Medicine

## 2021-10-12 ENCOUNTER — Other Ambulatory Visit: Payer: Self-pay

## 2021-10-12 ENCOUNTER — Ambulatory Visit
Admission: RE | Admit: 2021-10-12 | Discharge: 2021-10-12 | Disposition: A | Discharge status: Home / Self Care | Payer: HMO | Attending: Family Medicine | Admitting: Family Medicine

## 2021-10-12 VITALS — BP 103/64 | HR 63 | Resp 16 | Wt 192.0 lb

## 2021-10-12 DIAGNOSIS — G9332 Myalgic encephalomyelitis/chronic fatigue syndrome: Secondary | ICD-10-CM | POA: Diagnosis not present

## 2021-10-12 DIAGNOSIS — U099 Post covid-19 condition, unspecified: Secondary | ICD-10-CM

## 2021-10-12 DIAGNOSIS — E7800 Pure hypercholesterolemia, unspecified: Secondary | ICD-10-CM

## 2021-10-12 DIAGNOSIS — E1140 Type 2 diabetes mellitus with diabetic neuropathy, unspecified: Secondary | ICD-10-CM | POA: Diagnosis not present

## 2021-10-12 DIAGNOSIS — I5032 Chronic diastolic (congestive) heart failure: Secondary | ICD-10-CM | POA: Diagnosis not present

## 2021-10-12 DIAGNOSIS — B379 Candidiasis, unspecified: Secondary | ICD-10-CM | POA: Diagnosis not present

## 2021-10-12 DIAGNOSIS — S99912A Unspecified injury of left ankle, initial encounter: Secondary | ICD-10-CM | POA: Insufficient documentation

## 2021-10-12 DIAGNOSIS — I10 Essential (primary) hypertension: Secondary | ICD-10-CM | POA: Diagnosis not present

## 2021-10-12 DIAGNOSIS — I4891 Unspecified atrial fibrillation: Secondary | ICD-10-CM | POA: Diagnosis not present

## 2021-10-12 DIAGNOSIS — M19072 Primary osteoarthritis, left ankle and foot: Secondary | ICD-10-CM | POA: Diagnosis not present

## 2021-10-12 LAB — POCT GLYCOSYLATED HEMOGLOBIN (HGB A1C)
Est. average glucose Bld gHb Est-mCnc: 174
Hemoglobin A1C: 7.7 % — AB (ref 4.0–5.6)

## 2021-10-12 MED ORDER — SEMAGLUTIDE (1 MG/DOSE) 4 MG/3ML ~~LOC~~ SOPN: 1.0000 mg | PEN_INJECTOR | SUBCUTANEOUS | 11 refills | Status: DC

## 2021-10-12 MED ORDER — FLUCONAZOLE 100 MG PO TABS: 100.0000 mg | ORAL_TABLET | Freq: Every day | ORAL | 1 refills | Status: DC

## 2021-10-12 NOTE — Progress Notes (Signed)
?  ? ? ?Established patient visit ? ?I,Teresa [name],acting as a scribe for Wilhemena Durie, MD.,have documented all relevant documentation on the behalf of Wilhemena Durie, MD,as directed by  Wilhemena Durie, MD while in the presence of Wilhemena Durie, MD. ? ? ?Patient: Teresa [name]   DOB: 1945-11-09   76 y.o. Female  MRN: [id number] ?Visit Date: 10/12/2021 ? ?Today's healthcare provider: Wilhemena Durie, MD  ? ?Chief Complaint  ?Patient presents with  ? Follow-up  ? Diabetes  ? Hypertension  ? Hyperlipidemia  ? ?Subjective  ?  ?HPI  ?Patient comes in today for follow-up.  She is back to working at [employer] despite [place] symptoms. ?She has chronic fatigue but other wise is feeling fairly well. ?Patient had a mechanical fall about a week ago and has had pain with weightbearing in her left lateral ankle since then. ?Diabetes Mellitus Type II, follow-up ? ?Lab Results  ?Component Value Date  ? HGBA1C 8.2 (H) 05/16/2021  ? HGBA1C 7.3 (A) 01/17/2021  ? HGBA1C 6.9 (A) 09/14/2020  ? ?Last seen for diabetes 5 months ago.  ?Management since then includes; Continue glimepiride but if her blood sugar gets below 7.5 days once he may discontinue this.  Continue Jardiance and metformin and Ozempic and Actos. ?She reports good compliance with treatment. ?She is not having side effects. none ? ?Home blood sugar records: fasting range: 65--155 ? ?Episodes of hypoglycemia? Yes often ?  ?Current insulin regiment: n/a ?Most Recent Eye Exam: 07/07/2020 ? ?--------------------------------------------------------------------------------------------------- ?Hypertension, follow-up ? ?BP Readings from Last 3 Encounters:  ?10/12/21 103/64  ?06/20/21 (!) 157/76  ?05/16/21 106/64  ? Wt Readings from Last 3 Encounters:  ?10/12/21 192 lb (87.1 kg)  ?09/19/21 177 lb 11.2 oz (80.6 kg)  ?06/20/21 198 lb 6.6 oz (90 kg)  ?  ? ?She was last seen for hypertension 5 months ago.  ?BP at that visit was 106/64. ?Management since  that visit includes; Good control on losartan HCTZ. ?She reports good compliance with treatment. ?She is not having side effects. none ?She is not exercising. ?She is not adherent to low salt diet.   ?Outside blood pressures are not check. ? ?She does not smoke. ? ?Use of agents associated with hypertension: none.  ? ?--------------------------------------------------------------------------------------------------- ? ? ?Medications: ?Outpatient Medications Prior to Visit  ?Medication Sig  ? acetaminophen (TYLENOL) 500 MG tablet Take 500 mg by mouth every 6 (six) hours as needed for mild pain or headache.  ? albuterol (VENTOLIN HFA) 108 (90 Base) MCG/ACT inhaler SMARTSIG:2 Inhalation Via Inhaler Every 6 Hours PRN  ? allopurinol (ZYLOPRIM) 100 MG tablet TAKE 1 TABLET BY MOUTH EVERY DAY  ? apixaban (ELIQUIS) 5 MG TABS tablet Take 5 mg by mouth daily.  ? atorvastatin (LIPITOR) 10 MG tablet TAKE 1 TABLET BY MOUTH EVERYDAY AT BEDTIME  ? Continuous Blood Gluc Receiver (FREESTYLE LIBRE 2 READER) DEVI 1 each by Does not apply route continuous.  ? Continuous Blood Gluc Sensor (FREESTYLE LIBRE 2 SENSOR) MISC USE AS DIRECTED  ? fluconazole (DIFLUCAN) 100 MG tablet Take 100 mg by mouth daily.  ? furosemide (LASIX) 40 MG tablet TAKE 1 TABLET BY MOUTH EVERY DAY  ? gabapentin (NEURONTIN) 800 MG tablet Take 800 mg by mouth 3 (three) times daily.  ? glimepiride (AMARYL) 4 MG tablet TAKE 1 TABLET BY MOUTH 2 TIMES DAILY.  ? JARDIANCE 25 MG TABS tablet TAKE 25 MG BY MOUTH DAILY.  ? levothyroxine (SYNTHROID) 88 MCG tablet TAKE  1 TABLET BY MOUTH EVERY DAY  ? metFORMIN (GLUCOPHAGE) 1000 MG tablet TAKE 1 TABLET BY MOUTH TWICE A DAY  ? nystatin cream (MYCOSTATIN) Apply 1 application topically 2 (two) times daily. (Patient taking differently: Apply 1 application. topically 2 (two) times daily as needed (for diabetic yeast reactions.).)  ? ondansetron (ZOFRAN) 4 MG tablet TAKE 1 TABLET BY MOUTH EVERY 8 HOURS AS NEEDED FOR NAUSEA AND VOMITING   ? OZEMPIC, 0.25 OR 0.5 MG/DOSE, 2 MG/1.5ML SOPN INJECT 0.'5MG'$  INTO THE SKIN ONCE A WEEK.  ? pioglitazone (ACTOS) 30 MG tablet TAKE 1 TABLET BY MOUTH EVERY DAY  ? predniSONE (DELTASONE) 20 MG tablet Take by mouth.  ? sulfamethoxazole-trimethoprim (BACTRIM) 400-80 MG tablet Take 1 tablet by mouth 3 (three) times a week.  ? ?No facility-administered medications prior to visit.  ? ? ?Review of Systems  ?Constitutional:  Negative for appetite change, chills, fatigue and fever.  ?Respiratory:  Negative for chest tightness and shortness of breath.   ?Cardiovascular:  Negative for chest pain and palpitations.  ?Gastrointestinal:  Negative for abdominal pain, nausea and vomiting.  ?Neurological:  Negative for dizziness and weakness.  ? ?Last hemoglobin A1c ?Lab Results  ?Component Value Date  ? HGBA1C 8.2 (H) 05/16/2021  ? ?  ?  Objective  ?  ?BP 103/64 (BP Location: Right Arm, Patient Position: Sitting, Cuff Size: Normal)   Pulse 63   Resp 16   Wt 192 lb (87.1 kg)   SpO2 95%   BMI 35.12 kg/m?  ?BP Readings from Last 3 Encounters:  ?10/12/21 103/64  ?06/20/21 (!) 157/76  ?05/16/21 106/64  ? ?Wt Readings from Last 3 Encounters:  ?10/12/21 192 lb (87.1 kg)  ?09/19/21 177 lb 11.2 oz (80.6 kg)  ?06/20/21 198 lb 6.6 oz (90 kg)  ? ?  ? ?Physical Exam ?Vitals reviewed.  ?Constitutional:   ?   Appearance: She is obese.  ?HENT:  ?   Head: Normocephalic and atraumatic.  ?   Right Ear: External ear normal.  ?   Left Ear: External ear normal.  ?Eyes:  ?   General: No scleral icterus. ?   Conjunctiva/sclera: Conjunctivae normal.  ?Cardiovascular:  ?   Rate and Rhythm: Normal rate and regular rhythm.  ?   Heart sounds: Murmur heard.  ?   Comments: 2/6 systolic murmur ?Pulmonary:  ?   Breath sounds: Normal breath sounds.  ?Abdominal:  ?   General: Bowel sounds are normal. There is no distension.  ?   Palpations: Abdomen is soft.  ?   Tenderness: There is no abdominal tenderness. There is no guarding.  ?Musculoskeletal:     ?    General: Swelling and tenderness present. No deformity.  ?   Right lower leg: No edema.  ?   Left lower leg: No edema.  ?   Comments: Mild swelling and tenderness of left lateral malleolus  ?Skin: ?   General: Skin is warm and dry.  ?Neurological:  ?   General: No focal deficit present.  ?   Mental Status: She is alert and oriented to person, place, and time.  ?Psychiatric:     ?   Mood and Affect: Mood normal.     ?   Behavior: Behavior normal.     ?   Thought Content: Thought content normal.     ?   Judgment: Judgment normal.  ?  ? ? ?No results found for any visits on 10/12/21. ? Assessment & Plan  ?  ? ?  1. Type 2 diabetes mellitus with diabetic neuropathy, without long-term current use of insulin ([place]) ?Last A1c 8.2.  Today he is 7.7 which is better ?Try Ozempic and follow-up in 3 months ?- POCT glycosylated hemoglobin (Hb A1C) ?- Semaglutide, 1 MG/DOSE, 4 MG/3ML SOPN; Inject 1 mg as directed once a week.  Dispense: 3 mL; Refill: 11 ? ?2. Essential hypertension ?Fair control ? ?3. Injury of left ankle, initial encounter ?X-ray left ankle.  May need Ortho referral if pain continues ?- DG Ankle Complete Left ? ?4. Yeast infection ? ?- fluconazole (DIFLUCAN) 100 MG tablet; Take 1 tablet (100 mg total) by mouth daily.  Dispense: 1 tablet; Refill: 1 ? ?5. Atrial fibrillation, unspecified type ([place]) ?By cardiology ?On Eliquis ?6. Pure hypercholesterolemia ?May need to discuss statin in this patient has been statin intolerant in the past.  LDL good at 95 but would like to see less than 70 ? ?7. Chronic diastolic CHF (congestive heart failure) ([place]) ? ? ?8. COVID-19 long hauler manifesting chronic fatigue ?  ? ?No follow-ups on file.  ?   ? ?I, Wilhemena Durie, MD, have reviewed all documentation for this visit. The documentation on 10/15/21 for the exam, diagnosis, procedures, and orders are all accurate and complete. ? ? ? ?Rhianne Soman Cranford Mon, MD  ?[facility] ?[phone] (phone) ?[phone]  (fax) ? ?[facility] ?

## 2021-10-12 NOTE — Telephone Encounter (Signed)
Faxed

## 2021-10-13 ENCOUNTER — Telehealth: Payer: Self-pay

## 2021-10-13 ENCOUNTER — Other Ambulatory Visit: Payer: Self-pay | Admitting: *Deleted

## 2021-10-13 NOTE — Telephone Encounter (Signed)
Patient was notified of results. Referral was ordered. ?

## 2021-10-13 NOTE — Telephone Encounter (Signed)
Copied from [place] [id number]. Topic: General - Other ?>> Oct 13, 2021  1:24 PM Pawlus, Brayton Layman A wrote: ?Reason for CRM: Pt called in wanting to go over her latest imaging / lab results, please advise. ?

## 2021-10-17 DIAGNOSIS — M25572 Pain in left ankle and joints of left foot: Secondary | ICD-10-CM | POA: Diagnosis not present

## 2021-10-18 ENCOUNTER — Other Ambulatory Visit: Payer: Self-pay | Admitting: Orthopaedic Surgery

## 2021-10-18 ENCOUNTER — Other Ambulatory Visit (HOSPITAL_COMMUNITY): Payer: Self-pay | Admitting: Orthopaedic Surgery

## 2021-10-18 ENCOUNTER — Telehealth: Payer: Self-pay

## 2021-10-18 DIAGNOSIS — M25572 Pain in left ankle and joints of left foot: Secondary | ICD-10-CM

## 2021-10-18 NOTE — Telephone Encounter (Signed)
Copied from [place] [id number]. Topic: General - Other ?>> Oct 18, 2021 11:22 AM Tessa Lerner A wrote: ?Reason for CRM: The patient has called to share that they will need to have a left ankle replacement or fusion  ? ?A full report will be sent soon regarding the recent imaging from [place] ? ?Please contact further if needed ?

## 2021-10-19 ENCOUNTER — Other Ambulatory Visit: Payer: Self-pay | Admitting: Orthopaedic Surgery

## 2021-10-20 ENCOUNTER — Other Ambulatory Visit: Payer: Self-pay | Admitting: Orthopaedic Surgery

## 2021-10-20 ENCOUNTER — Other Ambulatory Visit: Payer: Self-pay | Admitting: Family Medicine

## 2021-10-20 ENCOUNTER — Ambulatory Visit
Admission: RE | Admit: 2021-10-20 | Discharge: 2021-10-20 | Disposition: A | Discharge status: Home / Self Care | Payer: HMO | Source: Ambulatory Visit | Attending: Orthopaedic Surgery | Admitting: Orthopaedic Surgery

## 2021-10-20 ENCOUNTER — Other Ambulatory Visit: Payer: Self-pay

## 2021-10-20 DIAGNOSIS — M19072 Primary osteoarthritis, left ankle and foot: Secondary | ICD-10-CM | POA: Diagnosis not present

## 2021-10-20 DIAGNOSIS — M25572 Pain in left ankle and joints of left foot: Secondary | ICD-10-CM | POA: Diagnosis not present

## 2021-10-20 NOTE — Telephone Encounter (Signed)
Requested Prescriptions  ?Pending Prescriptions Disp Refills  ?? furosemide (LASIX) 40 MG tablet [Pharmacy Med Name: furosemide 40 mg tablet] 90 tablet 0  ?  Sig: TAKE ONE TABLET BY MOUTH ONCE DAILY  ?  ? Cardiovascular:  Diuretics - Loop Failed - 10/20/2021 11:30 AM  ?  ?  Failed - Mg Level in normal range and within 180 days  ?  Magnesium  ?Date Value Ref Range Status  ?06/18/2021 1.6 (L) 1.7 - 2.4 mg/dL Final  ?  Comment:  ?  Performed at [facility], [address] [place], [place] [address]  ?07/30/2012 1.2 (L) mg/dL Final  ?  Comment:  ?  1.8-2.4 ?THERAPEUTIC RANGE: 4-7 mg/dL ?TOXIC: > 10 mg/dL ? ----------------------- ?  ?   ?  ?  Passed - K in normal range and within 180 days  ?  Potassium  ?Date Value Ref Range Status  ?06/18/2021 3.9 3.5 - 5.1 mmol/L Final  ?05/14/2014 3.7 3.5 - 5.1 mmol/L Final  ?   ?  ?  Passed - Ca in normal range and within 180 days  ?  Calcium  ?Date Value Ref Range Status  ?06/18/2021 8.9 8.9 - 10.3 mg/dL Final  ? ?Calcium, Total  ?Date Value Ref Range Status  ?05/14/2014 9.3 8.5 - 10.1 mg/dL Final  ?   ?  ?  Passed - Na in normal range and within 180 days  ?  Sodium  ?Date Value Ref Range Status  ?06/18/2021 139 135 - 145 mmol/L Final  ?05/16/2021 141 134 - 144 mmol/L Final  ?05/14/2014 141 136 - 145 mmol/L Final  ?   ?  ?  Passed - Cr in normal range and within 180 days  ?  Creatinine  ?Date Value Ref Range Status  ?05/14/2014 0.73 0.60 - 1.30 mg/dL Final  ? ?Creatinine, Ser  ?Date Value Ref Range Status  ?06/18/2021 0.59 0.44 - 1.00 mg/dL Final  ?   ?  ?  Passed - Cl in normal range and within 180 days  ?  Chloride  ?Date Value Ref Range Status  ?06/18/2021 111 98 - 111 mmol/L Final  ?05/14/2014 106 98 - 107 mmol/L Final  ?   ?  ?  Passed - Last BP in normal range  ?  BP Readings from Last 1 Encounters:  ?10/12/21 103/64  ?   ?  ?  Passed - Valid encounter within last 6 months  ?  Recent Outpatient Visits   ?      ? 1 week ago Type 2 diabetes mellitus with diabetic  neuropathy, without long-term current use of insulin ([place])  ? [facility] [name], MD  ? 5 months ago Type 2 diabetes mellitus with diabetic neuropathy, without long-term current use of insulin ([place])  ? [facility] [name], MD  ? 9 months ago Type 2 diabetes mellitus with diabetic neuropathy, without long-term current use of insulin ([place])  ? [facility] [name], MD  ? 1 year ago Encounter for immunization  ? [facility] [name], MD  ? 1 year ago Type 2 diabetes mellitus with diabetic neuropathy, without long-term current use of insulin ([place])  ? [facility] [name], MD  ?  ?  ?Future Appointments   ?        ? In 4 months [name], MD [facility]  ?  ? ?  ?  ?  ? ?

## 2021-10-24 DIAGNOSIS — I700 Atherosclerosis of aorta: Secondary | ICD-10-CM | POA: Diagnosis not present

## 2021-10-24 DIAGNOSIS — I480 Paroxysmal atrial fibrillation: Secondary | ICD-10-CM | POA: Diagnosis not present

## 2021-10-24 DIAGNOSIS — R079 Chest pain, unspecified: Secondary | ICD-10-CM | POA: Diagnosis not present

## 2021-10-24 DIAGNOSIS — R42 Dizziness and giddiness: Secondary | ICD-10-CM | POA: Diagnosis not present

## 2021-10-24 DIAGNOSIS — J449 Chronic obstructive pulmonary disease, unspecified: Secondary | ICD-10-CM | POA: Diagnosis not present

## 2021-10-24 DIAGNOSIS — G4730 Sleep apnea, unspecified: Secondary | ICD-10-CM | POA: Diagnosis not present

## 2021-10-24 DIAGNOSIS — I272 Pulmonary hypertension, unspecified: Secondary | ICD-10-CM | POA: Diagnosis not present

## 2021-10-24 DIAGNOSIS — U099 Post covid-19 condition, unspecified: Secondary | ICD-10-CM | POA: Diagnosis not present

## 2021-10-24 DIAGNOSIS — E119 Type 2 diabetes mellitus without complications: Secondary | ICD-10-CM | POA: Diagnosis not present

## 2021-10-24 DIAGNOSIS — I2510 Atherosclerotic heart disease of native coronary artery without angina pectoris: Secondary | ICD-10-CM | POA: Diagnosis not present

## 2021-10-24 DIAGNOSIS — I10 Essential (primary) hypertension: Secondary | ICD-10-CM | POA: Diagnosis not present

## 2021-10-24 DIAGNOSIS — I5032 Chronic diastolic (congestive) heart failure: Secondary | ICD-10-CM | POA: Diagnosis not present

## 2021-10-25 ENCOUNTER — Other Ambulatory Visit: Payer: Self-pay | Admitting: Student

## 2021-10-25 DIAGNOSIS — R079 Chest pain, unspecified: Secondary | ICD-10-CM

## 2021-10-26 ENCOUNTER — Telehealth (HOSPITAL_COMMUNITY): Payer: Self-pay | Admitting: Emergency Medicine

## 2021-10-26 ENCOUNTER — Encounter (HOSPITAL_COMMUNITY): Payer: Self-pay

## 2021-10-26 DIAGNOSIS — Z79899 Other long term (current) drug therapy: Secondary | ICD-10-CM | POA: Diagnosis not present

## 2021-10-26 DIAGNOSIS — I2510 Atherosclerotic heart disease of native coronary artery without angina pectoris: Secondary | ICD-10-CM | POA: Diagnosis not present

## 2021-10-26 DIAGNOSIS — I5032 Chronic diastolic (congestive) heart failure: Secondary | ICD-10-CM | POA: Diagnosis not present

## 2021-10-26 DIAGNOSIS — I10 Essential (primary) hypertension: Secondary | ICD-10-CM | POA: Diagnosis not present

## 2021-10-26 NOTE — Telephone Encounter (Signed)
Attempted to call patient regarding upcoming cardiac CT appointment. ?Left message on voicemail with name and callback number ?Marchia Bond RN Navigator Cardiac Imaging ?[facility] ?[phone] Office ?[phone] Cell ? ?Pt at lab and requested a call back to discuss instructions ?

## 2021-10-27 ENCOUNTER — Telehealth (HOSPITAL_COMMUNITY): Payer: Self-pay | Admitting: Emergency Medicine

## 2021-10-27 ENCOUNTER — Ambulatory Visit (HOSPITAL_COMMUNITY): Admission: RE | Admit: 2021-10-27 | Payer: HMO | Source: Ambulatory Visit

## 2021-10-27 ENCOUNTER — Ambulatory Visit: Admission: RE | Admit: 2021-10-27 | Payer: HMO | Source: Ambulatory Visit

## 2021-10-27 NOTE — Telephone Encounter (Signed)
Pt cancelled appt because she does not have a designated driver available for the test.  ?She will need to take prednisone and benadryl for her contrast allergy and requires a designated driver for this reason.  ? ?Pt states she will call back when shes ready to r/s ? ?Marchia Bond RN Navigator Cardiac Imaging ?[facility] ?[phone] Office  ?[phone] Cell ? ?

## 2021-10-28 ENCOUNTER — Encounter (HOSPITAL_COMMUNITY): Payer: Self-pay | Admitting: Emergency Medicine

## 2021-10-28 ENCOUNTER — Encounter (HOSPITAL_COMMUNITY): Payer: Self-pay | Admitting: Orthopaedic Surgery

## 2021-10-28 ENCOUNTER — Encounter (HOSPITAL_COMMUNITY): Payer: Self-pay

## 2021-10-28 ENCOUNTER — Other Ambulatory Visit: Payer: Self-pay

## 2021-10-28 ENCOUNTER — Ambulatory Visit (HOSPITAL_COMMUNITY): Admission: RE | Admit: 2021-10-28 | Payer: HMO | Source: Ambulatory Visit

## 2021-10-28 NOTE — Progress Notes (Signed)
Ms Bellanger states she is having chest pain  , mid chest 5 out of 10,  patient states that she is short of breath,which she is having chest pain. Patient states that chest pain decreases about 15 minutes after starting.  I asked patient if I need to call 911, Ms Tollett have Afib, sometimes I feel it fluttering, that really hurts. Patient  reported that her Drs are aware know, it happens all the time. Ms Lukehart stated that her cardiologist Dr. Prince Rome  had ordered a CT Scan of her heart, Ms Twining said , it was for today, but they were not able to do it because patient did not have transportation home, the procedure was to be done under anesthesia. I sent Willeen Cass, DNP for anesthesia message with the information I was given while I was taking to Ms Madewell. ? ?Ms Graylon Good has a Free Style glucose reader, patient reports that CBGs run 82 - 158, last A1C was 7.7 on 10/12/21. I instructed Ms Standish to not take Jardiance Monday or Tuesday morning; to not take Glimipride Monday evening or Tuesday am, do not take Metformin on Tuesday am. I Instructed Ms Demarais to stop vitamins and herbal products today.  Ms Rightmyer reports that she took last dose of Eliquis this am. ? ?Ms Effertz was at a [facility] visit ing a friend when I called, she did not tell me, but before I went over checking CBGs and showering instructions, patient said she had to go.  I sold Ms Mander that if surgery is not cancelled that I would call her Monday. ?

## 2021-10-31 ENCOUNTER — Telehealth (HOSPITAL_COMMUNITY): Payer: Self-pay | Admitting: Emergency Medicine

## 2021-10-31 ENCOUNTER — Encounter (HOSPITAL_COMMUNITY): Payer: Self-pay

## 2021-10-31 NOTE — Telephone Encounter (Signed)
Reaching out to patient to offer assistance regarding upcoming cardiac imaging study; pt verbalizes understanding of appt date/time, parking situation and where to check in, pre-test NPO status and medications ordered, and verified current allergies; name and call back number provided for further questions should they arise ?Marchia Bond RN Navigator Cardiac Imaging ?[facility] ?[phone] office ?[phone] cell ? ?13 hr prep ?Denies iv issues ?Arrival 245 ?[place] message sent  ?

## 2021-11-01 ENCOUNTER — Ambulatory Visit (HOSPITAL_COMMUNITY)
Admission: RE | Admit: 2021-11-01 | Discharge: 2021-11-01 | Disposition: A | Discharge status: Home / Self Care | Payer: HMO | Attending: Orthopaedic Surgery | Admitting: Orthopaedic Surgery

## 2021-11-01 ENCOUNTER — Other Ambulatory Visit: Payer: Self-pay | Admitting: Cardiology

## 2021-11-01 ENCOUNTER — Other Ambulatory Visit: Payer: Self-pay

## 2021-11-01 ENCOUNTER — Ambulatory Visit (HOSPITAL_COMMUNITY)
Admission: RE | Admit: 2021-11-01 | Disposition: A | Discharge status: Home / Self Care | Payer: HMO | Source: Home / Self Care | Admitting: Orthopaedic Surgery

## 2021-11-01 DIAGNOSIS — I4891 Unspecified atrial fibrillation: Secondary | ICD-10-CM | POA: Insufficient documentation

## 2021-11-01 DIAGNOSIS — I2510 Atherosclerotic heart disease of native coronary artery without angina pectoris: Secondary | ICD-10-CM | POA: Diagnosis not present

## 2021-11-01 DIAGNOSIS — R079 Chest pain, unspecified: Secondary | ICD-10-CM | POA: Diagnosis not present

## 2021-11-01 DIAGNOSIS — Z7901 Long term (current) use of anticoagulants: Secondary | ICD-10-CM | POA: Insufficient documentation

## 2021-11-01 DIAGNOSIS — R931 Abnormal findings on diagnostic imaging of heart and coronary circulation: Secondary | ICD-10-CM | POA: Insufficient documentation

## 2021-11-01 DIAGNOSIS — Z7984 Long term (current) use of oral hypoglycemic drugs: Secondary | ICD-10-CM | POA: Diagnosis not present

## 2021-11-01 HISTORY — DX: Cardiac arrhythmia, unspecified: I49.9

## 2021-11-01 HISTORY — DX: Unspecified osteoarthritis, unspecified site: M19.90

## 2021-11-01 HISTORY — DX: Personal history of urinary calculi: Z87.442

## 2021-11-01 HISTORY — DX: Heart failure, unspecified: I50.9

## 2021-11-01 HISTORY — DX: Atherosclerotic heart disease of native coronary artery without angina pectoris: I25.10

## 2021-11-01 SURGERY — ARTHROSCOPY, ANKLE, WITH FUSION
Anesthesia: General | Site: Ankle | Laterality: Left

## 2021-11-01 MED ORDER — NITROGLYCERIN 0.4 MG SL SUBL
SUBLINGUAL_TABLET | SUBLINGUAL | Status: AC
  Filled 2021-11-01: qty 2

## 2021-11-01 MED ORDER — DILTIAZEM HCL 25 MG/5ML IV SOLN
INTRAVENOUS | Status: AC
  Administered 2021-11-01: 10 mg via INTRAVENOUS
  Filled 2021-11-01: qty 5

## 2021-11-01 MED ORDER — IOHEXOL 350 MG/ML SOLN: 100.0000 mL | Freq: Once | INTRAVENOUS | Status: DC | PRN

## 2021-11-01 MED ORDER — DILTIAZEM HCL 25 MG/5ML IV SOLN
10.0000 mg | INTRAVENOUS | Status: DC | PRN
Start: 2021-11-01 — End: 2021-11-02

## 2021-11-01 MED ORDER — NITROGLYCERIN 0.4 MG SL SUBL
0.8000 mg | SUBLINGUAL_TABLET | Freq: Once | SUBLINGUAL | Status: AC
  Administered 2021-11-01: 0.8 mg via SUBLINGUAL

## 2021-11-01 MED ORDER — IOHEXOL 350 MG/ML SOLN
100.0000 mL | Freq: Once | INTRAVENOUS | Status: AC | PRN
  Administered 2021-11-01: 100 mL via INTRAVENOUS

## 2021-11-01 NOTE — Progress Notes (Signed)
PIV in RIGHT AC infiltrated with contrast injection. Cold compress applied immediately. Pt c/o of significant amount of pain at inflitration site and tingling in the right hand. Ascencion Dike, PA at bedside to evaluate pt.  ?

## 2021-11-01 NOTE — Progress Notes (Signed)
This patient has received 100 ml's of IV omni350 contrast extravasation into her right antecubital during a CT Coronary exam. ? ?The exam was performed on (date) Tuesday 11/01/21 ? ?Site / affected area assessed by Ascencion Dike, Radiology PA ? ? ?

## 2021-11-01 NOTE — Progress Notes (Signed)
Contrast extravasation of 124m right antecubital venipuncture site. ?Cold compress and extremity elevated immediately. ?Has already improved some. ?Denies tingling in extremities. ? ?Site with focal area of swelling, some reddish/purple skin discoloration but no blistering. ?Neurovascular intact ? ?Instructions provided and reviewed with pt. ? ?KAscencion DikePA-C ?Interventional Radiology ?11/01/2021 ?3:49 PM ? ? ?

## 2021-11-02 ENCOUNTER — Ambulatory Visit: Payer: HMO | Admitting: Podiatry

## 2021-11-02 ENCOUNTER — Ambulatory Visit (HOSPITAL_COMMUNITY)
Admission: RE | Admit: 2021-11-02 | Discharge: 2021-11-02 | Disposition: A | Discharge status: Home / Self Care | Payer: HMO | Source: Ambulatory Visit | Attending: Cardiology | Admitting: Cardiology

## 2021-11-02 ENCOUNTER — Encounter: Payer: Self-pay | Admitting: Podiatry

## 2021-11-02 DIAGNOSIS — M19172 Post-traumatic osteoarthritis, left ankle and foot: Secondary | ICD-10-CM | POA: Diagnosis not present

## 2021-11-02 DIAGNOSIS — R079 Chest pain, unspecified: Secondary | ICD-10-CM | POA: Diagnosis not present

## 2021-11-02 DIAGNOSIS — I4891 Unspecified atrial fibrillation: Secondary | ICD-10-CM

## 2021-11-02 DIAGNOSIS — E1140 Type 2 diabetes mellitus with diabetic neuropathy, unspecified: Secondary | ICD-10-CM | POA: Diagnosis not present

## 2021-11-02 DIAGNOSIS — R931 Abnormal findings on diagnostic imaging of heart and coronary circulation: Secondary | ICD-10-CM

## 2021-11-02 DIAGNOSIS — M859 Disorder of bone density and structure, unspecified: Secondary | ICD-10-CM

## 2021-11-03 LAB — VITAMIN D 25 HYDROXY (VIT D DEFICIENCY, FRACTURES): Vit D, 25-Hydroxy: 20.4 ng/mL — ABNORMAL LOW (ref 30.0–100.0)

## 2021-11-03 LAB — CALCIUM: Calcium: 10.1 mg/dL (ref 8.7–10.3)

## 2021-11-04 ENCOUNTER — Encounter: Payer: Self-pay | Admitting: Podiatry

## 2021-11-04 MED ORDER — VITAMIN D (ERGOCALCIFEROL) 1.25 MG (50000 UNIT) PO CAPS: 50000.0000 [IU] | ORAL_CAPSULE | ORAL | 0 refills | Status: DC

## 2021-11-04 NOTE — Progress Notes (Signed)
?Subjective:  ?Patient ID: Teresa [name], female    DOB: 16-Aug-1945,  MRN: [id number] ? ?Chief Complaint  ?Patient presents with  ? Ankle Injury  ?  Patient fell 3 weeks ago, injured left ankle, now painful to walk or sit, swells  ? ? ?76 y.o. female presents with the above complaint. History confirmed with patient.  She states that she fell about 3 weeks ago and twisted her left ankle.  She was seen by her PCP and had an x-ray and a CT scan done.  Its painful to walk or sit and swells up.  Causes sharp throbbing pain.  She works as a Marine scientist.  She is been wearing an ankle brace taking Tylenol and icing the ankle which has helped some.  She had been scheduled for surgery with Dr. Lucia Gaskins, she says that he told her that he would probably do either an ankle fusion or an ankle replacement and really would not know which one he would do until he got in there.  She says she was not able to get the cardiac CT done in time and so the surgery was canceled.  She is here for second opinion today.  She has type 2 diabetes her A1c is 7.7%.  She does not recall having a recent vitamin D level checked.  She previously broke this ankle. ? ?Objective:  ?Physical Exam: ?warm, good capillary refill, no trophic changes or ulcerative lesions, normal DP and PT pulses, and normal sensory exam. ?Left Foot: Limited range of motion of the ankle that is painful this causes swelling along the anterior joint line ? ? ?CT of the left ankle ?IMPRESSION: ?1. No acute fracture or dislocation of the left ankle. ?2. Postsurgical changes of prior ORIF involving the distal left ?fibula and medial malleolus without evidence of hardware ?complication. ?3. Moderate-severe tibiotalar osteoarthritis. ?  ?  ?Electronically Signed ?  By: Davina Poke D.O. ?  On: 10/21/2021 11:52 ?Assessment:  ? ?1. Post-traumatic arthritis of left ankle   ?2. Low bone density   ?3. Atrial fibrillation, unspecified type ([place])   ?4. Type 2 diabetes mellitus with diabetic  neuropathy, without long-term current use of insulin ([place])   ? ? ? ?Plan:  ?Patient was evaluated and treated and all questions answered. ? ?I reviewed the most recent radiographs and CT scan both reports and independent review of the images was taken.  I discussed with her that with her ankle arthritis and her comorbidities that ankle arthrodesis would likely be the best choice.  We discussed the limitations of this and the limitation of range of motion and that likely would require modified shoes.  I discussed with her the recovery process including a period of nonweightbearing for 2 to 3 months.  She states she was not aware that it would require so much recovery and plan to go back to work about 2 weeks after surgery.  I do not think this is going to be reasonable after such a surgery at her age and comorbidities.  Additionally I have ordered a vitamin D level, this came back low at 20 and I am beginning to supplement her.  She has an upcoming bone density test as well.  I have also messaged her cardiologist Dr. Clayborn Bigness to see the status of her cardiac clearance.  With her diabetes, vitamin D deficiency and cardiac disease she is at a higher risk of complications following such surgery.  We discussed these possible complications including not limited to  pain,  swelling, infection, scar, numbness which may be temporary or permanent, chronic pain, stiffness, nerve pain or damage, wound healing problems, bone healing problems including delayed or non-union.  We also discussed the risk of stroke heart attack or death.  She says she is still willing to undergo the surgery if the pain is unbearable.  I recommended while we continue to work her up for surgery we explore nonsurgical treatment options including corticosteroid injection into the ankle as well as a long-term brace such as an [place] brace.  She will be seen by our orthotist for this as well as her annual diabetic shoes.  Following sterile prep with  Betadine I injected 10 mg of Kenalog and 4 mg of dexamethasone into the left ankle which she tolerated well.  We understand that this is not a long-term solution but more symptomatic relief.  I will see her back in about 1 month to follow-up on the above. ? ? ?Return in about 4 weeks (around 11/30/2021) for follow up on ankle arthritis.  ? ?

## 2021-11-08 ENCOUNTER — Ambulatory Visit
Admission: RE | Admit: 2021-11-08 | Discharge: 2021-11-08 | Disposition: A | Discharge status: Home / Self Care | Payer: HMO | Source: Ambulatory Visit | Attending: Family Medicine | Admitting: Family Medicine

## 2021-11-08 DIAGNOSIS — Z1231 Encounter for screening mammogram for malignant neoplasm of breast: Secondary | ICD-10-CM | POA: Insufficient documentation

## 2021-11-08 DIAGNOSIS — Z780 Asymptomatic menopausal state: Secondary | ICD-10-CM | POA: Insufficient documentation

## 2021-11-08 DIAGNOSIS — M85852 Other specified disorders of bone density and structure, left thigh: Secondary | ICD-10-CM | POA: Diagnosis not present

## 2021-11-25 ENCOUNTER — Ambulatory Visit: Payer: HMO

## 2021-11-25 ENCOUNTER — Encounter: Payer: Self-pay | Admitting: Podiatry

## 2021-11-25 DIAGNOSIS — M205X9 Other deformities of toe(s) (acquired), unspecified foot: Secondary | ICD-10-CM

## 2021-11-25 DIAGNOSIS — E1142 Type 2 diabetes mellitus with diabetic polyneuropathy: Secondary | ICD-10-CM

## 2021-11-25 NOTE — Progress Notes (Signed)
SITUATION ?Reason for Consult: Evaluation for Prefabricated Diabetic Shoes and Custom Diabetic Inserts. ?Patient / Caregiver Report: Patient would like well fitting shoes ? ?OBJECTIVE DATA: ?Patient History / Diagnosis:  ?  ICD-10-CM   ?1. Diabetic polyneuropathy associated with type 2 diabetes mellitus ([place])  E11.42   ?  ?2. Hallux limitus, acquired, unspecified laterality  M20.5X9   ?  ? ? ?Physician Treating Diabetes:  Jerrol Banana, MD ? ?Current or Previous Devices:   Current user ? ?In-Person Foot Examination: ?Ulcers & Callousing:   None ?Deformities:    Hallux Limitus ?Sensation:    Compromised  ?Shoe Size:     9W ? ?ORTHOTIC RECOMMENDATION ?Recommended Devices: ?- 1x pair prefabricated PDAC approved diabetic shoes; Patient Selected [place] ? Size 9W ?- 3x pair custom-to-patient PDAC approved vacuum formed diabetic insoles. ? ?GOALS OF SHOES AND INSOLES ?- Reduce shear and pressure ?- Reduce / Prevent callus formation ?- Reduce / Prevent ulceration ?- Protect the fragile healing compromised diabetic foot. ? ?Patient would benefit from diabetic shoes and inserts as patient has diabetes mellitus and the patient has one or more of the following conditions: ?- History of partial or complete amputation of the foot ?- History of previous foot ulceration. ?- History of pre-ulcerative callus ?- Peripheral neuropathy with evidence of callus formation ?- Foot deformity ?- Poor circulation ? ?ACTIONS PERFORMED ?Potential out of pocket cost was communicated to patient. Patient understood and consented to measurement and casting. Patient was casted for insoles via crush box and measured for shoes via brannock device. Procedure was explained and patient tolerated procedure well. All questions were answered and concerns addressed. Casts were shipped to central fabrication for HOLD until Certificate of Medical Necessity or otherwise necessary authorization from insurance is obtained. ? ?PLAN ?Shoes  are to be ordered and casts released from hold once all appropriate paperwork is complete. Patient is to be contacted and scheduled for fitting once shoes and insoles have been fabricated and received. ? ?

## 2021-11-30 ENCOUNTER — Ambulatory Visit: Payer: HMO | Admitting: Podiatry

## 2021-11-30 ENCOUNTER — Ambulatory Visit (INDEPENDENT_AMBULATORY_CARE_PROVIDER_SITE_OTHER): Payer: HMO

## 2021-11-30 ENCOUNTER — Encounter: Payer: Self-pay | Admitting: Podiatry

## 2021-11-30 ENCOUNTER — Encounter: Payer: Self-pay | Admitting: *Deleted

## 2021-11-30 DIAGNOSIS — M19172 Post-traumatic osteoarthritis, left ankle and foot: Secondary | ICD-10-CM | POA: Diagnosis not present

## 2021-11-30 DIAGNOSIS — E559 Vitamin D deficiency, unspecified: Secondary | ICD-10-CM

## 2021-11-30 DIAGNOSIS — M859 Disorder of bone density and structure, unspecified: Secondary | ICD-10-CM

## 2021-12-01 ENCOUNTER — Encounter: Payer: Self-pay | Admitting: Podiatry

## 2021-12-01 NOTE — Progress Notes (Addendum)
?Subjective:  ?Patient ID: Teresa [name], female    DOB: [birth year],  MRN: [id number] ? ?Chief Complaint  ?Patient presents with  ? Arthritis  ?  Follow up left ankle arthritis  ? ? ?76 y.o. female presents with the above complaint. History confirmed with patient.  She states that she fell about 3 weeks ago and twisted her left ankle.  She was seen by her PCP and had an x-ray and a CT scan done.  Its painful to walk or sit and swells up.  Causes sharp throbbing pain.  She works as a Marine scientist.  She is been wearing an ankle brace taking Tylenol and icing the ankle which has helped some.  She had been scheduled for surgery with Dr. Lucia Gaskins, she says that he told her that he would probably do either an ankle fusion or an ankle replacement and really would not know which one he would do until he got in there.  She says she was not able to get the cardiac CT done in time and so the surgery was canceled.  She is here for second opinion today.  She has type 2 diabetes her A1c is 7.7%.  She does not recall having a recent vitamin D level checked.  She previously broke this ankle. ? ? ?Interval history: ?Since last that she completed her lab work and is started taking the vitamin D supplementation.  She was seen by the orthotist and they decided that they will hold off on an [place] brace until after surgery if it is necessary at that point. ? ?Objective:  ?Physical Exam: ?warm, good capillary refill, no trophic changes or ulcerative lesions, normal DP and PT pulses, and normal sensory exam. ?Left Foot: Limited range of motion of the ankle that is painful this causes swelling along the anterior joint line ? ? ?CT of the left ankle ?IMPRESSION: ?1. No acute fracture or dislocation of the left ankle. ?2. Postsurgical changes of prior ORIF involving the distal left ?fibula and medial malleolus without evidence of hardware ?complication. ?3. Moderate-severe tibiotalar osteoarthritis. ?  ?  ?Electronically Signed ?  By: Davina Poke D.O. ?  On: 10/21/2021 11:52 ? ?New weightbearing ankle radiographs today show significant osteoarthrosis of the ankle joint, there is mild valgus deformity noted ?Assessment:  ? ?1. Post-traumatic arthritis of left ankle   ?2. Low bone density   ?3. Vitamin D deficiency   ? ? ? ?Plan:  ?Patient was evaluated and treated and all questions answered. ? ?Today we again discussed in detail the ankle arthritis that she has been dealing with for some time.  Discussed with her the presence of the low bone density and vitamin D deficiency and she is currently taking the supplementation.  I discussed with her that surgically I think she would be best served by an ankle arthrodesis.  I do not think ankle replacement would be in her best interest with her diabetes.  I discussed the rationale behind this with her.  Discussed that I also would recommend bone graft from the heel for the arthrodesis site as well as bone marrow aspirate for the leg for the fusion site.  We discussed the risk benefits and potential complications of the surgery including but not limited to pain, swelling, infection, scar, numbness which may be temporary or permanent, chronic pain, stiffness, nerve pain or damage, wound healing problems, bone healing problems including delayed or non-union.  We discussed the need for prolonged immobilization including cast for 8 weeks  total.  We will try to do this through an arthroscopic approach but the procedure may need to be converted to open if adequate resection alignment cannot be achieved.  All questions were addressed.  Informed consent was signed and reviewed.  Surgical be scheduled at a mutually agreeable date.  Until then continue to use the CAM boot for ambulation and vitamin D supplementation.  I would like her vitamin D level to be checked again prior to surgery.  We will plan for postoperative admission to the hospital service after surgery for postop observation, physical therapy, I expect she  likely will need admission to subacute rehab following surgery as she lives alone and does not have much help to get around the house and she has stairs as well. ? ? ? ?Surgical plan: ? ?Procedure: ?-left ankle arthrodesis, bone graft from heel possible bone marrow aspirate from leg ? ?Location: ?-[facility] ? ?Anesthesia plan: ?-General anesthesia with preop regional block ? ?Postoperative pain plan: ?- Tylenol 1000 mg every 6 hours, ibuprofen 600 mg every 6 hours, gabapentin 300 mg every 8 hours x5 days, oxycodone 5 mg 1-2 tabs every 6 hours only as needed ? ?DVT prophylaxis: ?-She will resume her Eliquis after surgery ? ?WB Restrictions / DME needs: ?-NWB in cast after surgery ? ? ? ?No follow-ups on file.  ? ?

## 2021-12-01 NOTE — Telephone Encounter (Signed)
Please advise 

## 2021-12-02 NOTE — Telephone Encounter (Signed)
Addressed during office visit.

## 2021-12-07 ENCOUNTER — Ambulatory Visit: Payer: HMO | Admitting: Cardiology

## 2021-12-08 ENCOUNTER — Encounter: Payer: Self-pay | Admitting: Podiatry

## 2021-12-08 ENCOUNTER — Telehealth: Payer: Self-pay

## 2021-12-08 ENCOUNTER — Ambulatory Visit: Payer: HMO | Admitting: Podiatry

## 2021-12-08 DIAGNOSIS — D689 Coagulation defect, unspecified: Secondary | ICD-10-CM

## 2021-12-08 DIAGNOSIS — E1140 Type 2 diabetes mellitus with diabetic neuropathy, unspecified: Secondary | ICD-10-CM | POA: Diagnosis not present

## 2021-12-08 DIAGNOSIS — B351 Tinea unguium: Secondary | ICD-10-CM

## 2021-12-08 DIAGNOSIS — M79676 Pain in unspecified toe(s): Secondary | ICD-10-CM

## 2021-12-08 NOTE — Progress Notes (Signed)
? ? ?Chronic Care Management ?Pharmacy Assistant  ? ?Name: Teresa [name]  MRN: [id number] DOB: 12/18/1945 ? ?Reason for Encounter: Medication Review/Medication Coordination Call. ?  ?Recent office visits:  ?10/12/2021 Dr. Rosanna Randy MD (PCP) Increase Ozempic 1 mg weekly, start Fluconazole 100 mg daily, Follow-up in 3 months ?09/19/2021 Kirke Shaggy LPN (PCP Office) Medicare Wellness completed, No medication Changes noted ? ?Recent consult visits:  ?12/08/2021 Gardiner Barefoot DPM (Podiatry) No Medication Changes noted,Return in about 3 months  ?11/30/2021 Lanae Crumbly DPM (Podiatry) No medication Changes noted ?11/25/2021 Guadlupe Spanish MD (Orthotics) No medications Changes noted ?11/02/2021 Lanae Crumbly DPM (Podiatry) Start Vitamin D 50,000 every 7 days, Return in about 4 weeks   ?52/77/8242 Gladstone Pih NP (Cardiology) start Prednisone 50 mg every 6 hours x 3 doses at 13 hours, 7 hours and 1 hour prior to procedure due to contrast dye allergy, Refer to electrophysiology , Return in about 1 month  ? ?Hospital visits:  ?Medication Reconciliation was completed by comparing discharge summary, patient?s EMR and Pharmacy list, and upon discussion with patient. ? ?Admitted to the hospital on 11/01/2021 due to N/A. Discharge date was 11/01/2021. Discharged from [facility].   ? ?New?Medications Started at [facility] Discharge:?? ?-started None ID ? ?Medication Changes at Hospital Discharge: ?-Changed None ID ? ?Medications Discontinued at Hospital Discharge: ?-Stopped None ID ? ?Medications that remain the same after Hospital Discharge:??  ?-All other medications will remain the same.   ? ?Medications: ?Outpatient Encounter Medications as of 12/08/2021  ?Medication Sig Note  ? acetaminophen (TYLENOL) 500 MG tablet Take 1,000 mg by mouth every 6 (six) hours as needed for mild pain or headache.   ? albuterol (VENTOLIN HFA) 108 (90 Base) MCG/ACT inhaler Inhale 2 puffs into the lungs every 6 (six) hours as needed for  shortness of breath.   ? allopurinol (ZYLOPRIM) 100 MG tablet TAKE 1 TABLET BY MOUTH EVERY DAY   ? apixaban (ELIQUIS) 5 MG TABS tablet Take 5 mg by mouth 2 (two) times daily.   ? atorvastatin (LIPITOR) 10 MG tablet TAKE 1 TABLET BY MOUTH EVERYDAY AT BEDTIME   ? colchicine 0.6 MG tablet Take 0.6 mg by mouth daily as needed (gout).   ? Continuous Blood Gluc Receiver (FREESTYLE LIBRE 2 READER) DEVI 1 each by Does not apply route continuous.   ? Continuous Blood Gluc Sensor (FREESTYLE LIBRE 2 SENSOR) MISC USE AS DIRECTED   ? ELDERBERRY PO Take 1 capsule by mouth daily.   ? fluconazole (DIFLUCAN) 100 MG tablet Take 1 tablet (100 mg total) by mouth daily. (Patient taking differently: Take 100 mg by mouth daily as needed (yeast infections). Take for 5 days at onset of yeast infection)   ? furosemide (LASIX) 40 MG tablet TAKE ONE TABLET BY MOUTH ONCE DAILY   ? gabapentin (NEURONTIN) 800 MG tablet Take 800 mg by mouth 3 (three) times daily.   ? glimepiride (AMARYL) 4 MG tablet TAKE 1 TABLET BY MOUTH 2 TIMES DAILY.   ? JARDIANCE 25 MG TABS tablet TAKE 25 MG BY MOUTH DAILY.   ? levothyroxine (SYNTHROID) 88 MCG tablet TAKE 1 TABLET BY MOUTH EVERY DAY   ? metFORMIN (GLUCOPHAGE) 1000 MG tablet TAKE 1 TABLET BY MOUTH TWICE A DAY   ? nystatin cream (MYCOSTATIN) Apply 1 application topically 2 (two) times daily. (Patient taking differently: Apply 1 application. topically 2 (two) times daily as needed (for diabetic yeast reactions.).)   ? Omega-3 Fatty Acids (FISH OIL PO) Take 1  capsule by mouth daily.   ? ondansetron (ZOFRAN) 4 MG tablet TAKE 1 TABLET BY MOUTH EVERY 8 HOURS AS NEEDED FOR NAUSEA AND VOMITING   ? pioglitazone (ACTOS) 30 MG tablet TAKE 1 TABLET BY MOUTH EVERY DAY   ? predniSONE (DELTASONE) 50 MG tablet Take 50 mg by mouth See admin instructions. Take 50 mg 13 hrs, 7 hrs, and 1 hour prior to study 10/26/2021: For 3/24  ? Semaglutide, 1 MG/DOSE, 4 MG/3ML SOPN Inject 1 mg as directed once a week. 10/26/2021: Thursday   ?  sulfamethoxazole-trimethoprim (BACTRIM) 400-80 MG tablet Take 1 tablet by mouth every Monday, Wednesday, and Friday.   ? Vitamin D, Ergocalciferol, (DRISDOL) 1.25 MG (50000 UNIT) CAPS capsule Take 1 capsule (50,000 Units total) by mouth every 7 (seven) days.   ? ?No facility-administered encounter medications on file as of 12/08/2021.  ? ? ?Care Gaps: ?Shingrix Vaccine ?Urine Microalbumin ?Ophthalmology Exam ? ?Star Rating Drug: ?Ozempic 0.25-0.5 mg last filled on 08/29/2021 for a 84-day supply with [facility] ?Jardiance 25 mg last filled on 09/16/2021 for a 90-Day supply with [facility] ?Metformin 1000 mg last filled on 08/04/2021 for a 90-Day supply with [facility] ?Glimepiride 4 mg last filled on 08/02/2021 for a 90-Day supply with [facility] ?Atorvastatin 4 mg last filled on 06/27/2021 for a 90-Day supply with [facility] ?Pioglitazone 30 mg last filled on 08/02/2021 for a 90-Day supply with [facility] ? ?Medication Fill Gaps: ?None ID ? ?Reviewed chart for medication changes ahead of medication coordination call. ? ?BP Readings from Last 3 Encounters:  ?11/01/21 125/71  ?10/12/21 103/64  ?06/20/21 (!) 157/76  ?  ?Lab Results  ?Component Value Date  ? HGBA1C 7.7 (A) 10/12/2021  ?  ? ?Patient obtains medications through Adherence Packaging  90 Days  ? ?Last adherence delivery included:  ?None ID ? ?Patient declined medications last month: ?Allopurinol 1 tablet daily - Bedtime ?Eliquis 5 mg 1 tablet 2 times daily - Breakfast, Bedtime ?Atorvastatin 10 mg 1 tablet  daily - Bedtime ?Furosemide 40 mg 1 tablet daily - Breakfast ?Gabapentin 800 mg 1 tablet 3 times daily - Breakfast, Lunch, Bedtime ?Glimepiride 4 mg 1 tablet 2 times daily - Breakfast, bedtime ?Jardiance 25 mg 1 tablet daily - Breakfast ?Levothyroxine 88 MCG 1 tablet daily - Breakfast ?Metformin 1000 mg 1 tablet twice daily - Breakfast, Bedtime ?Pioglitazone 30 mg 1 tablet daily - Breakfast ?Vitamin D 1.25 mg 1 capsule daily for 7 days ?Freestyle  Libre 2 Sensor Change every 14 days ? ?Patient is due for next adherence delivery on: 12/20/2021. ?Called patient and reviewed medications and coordinated delivery. ? ?This delivery to include: ?Eliquis 5 mg 1 tablet 2 times daily - Breakfast, Bedtime ?Furosemide 40 mg 1 tablet daily - Breakfast ?Glimepiride 4 mg 1 tablet 2 times daily - Breakfast, bedtime ?Levothyroxine 88 MCG 1 tablet daily - Breakfast ?Pioglitazone 30 mg 1 tablet daily - Breakfast ?Freestyle Libre 2 Sensor Change every 14 days ?Ozempic Inject 1 mg weekly (Thursday)- patient states she does need a refill as she only has one pen left that she will use next Thursday on 12/15/2021 due to the increase dose. ? ? ?Patient declined the following medications: ?Allopurinol 100 mg 1 tablet daily - Bedtime (Adequate Supply, patient states she has around 30 day supply on hand as of 12/08/2021) ?Atorvastatin 10 mg 1 tablet  daily - Bedtime  (Adequate Supply, patient states she has around 30 day supply on hand as of 12/08/2021) ?Gabapentin 800  mg 1 tablet 3 times daily - Breakfast, Lunch, Bedtime  (Adequate Supply, patient states she has around 30 day supply on hand as of 12/08/2021) ?Jardiance 25 mg 1 tablet daily - Breakfast (Adequate Supply, patient states she has 4 bottles of 30 day supply on hand as of 12/08/2021) ?Metformin 1000 mg 1 tablet twice daily - Breakfast, Bedtime (Adequate Supply, patient states she has around 30 day supply on hand as of 12/08/2021) ?Vitamin D 1.25 mg 1 capsule daily for 7 days - (patient states she has a lab appointment on 12/24/2021 to recheck her Vitamin D, and will be inform if she needs to continue her Vit D or if she can stop.Patient will inform the pharmacy if she needs a refill after her lab work is completed). ? ?Patient needs refills for Furosemide - Prescribe by PCP. ? ?Confirmed delivery date of 12/20/2021 (First route), advised patient that pharmacy will contact them the morning of delivery. ?Patient states if she  not home it is okay to place delivery in her mailbox. ? ?Chronic Care Management APPOINTMENT REMINDER ? ?KODEE DRURY was reminded to  [place] and to have  all [employer]

## 2021-12-08 NOTE — Progress Notes (Signed)
This patient returns to my office for at risk foot care.  This patient requires this care by a professional since this patient will be at risk due to having  coagulation defect and diabetes.  Patient is taking eliquis. This patient is unable to cut nails herself since the patient cannot reach her nails.These nails are painful walking and wearing shoes.  This patient presents for at risk foot care today. ? ?General Appearance  Alert, conversant and in no acute stress. ? ?Vascular  Dorsalis pedis and posterior tibial  pulses are palpable  bilaterally.  Capillary return is within normal limits  bilaterally. Temperature is within normal limits  bilaterally. ? ?Neurologic  Senn-Weinstein monofilament wire test within normal limits  bilaterally. Muscle power within normal limits bilaterally. ? ?Nails Thick disfigured discolored nails with subungual debris  from hallux to fifth toes bilaterally. No evidence of bacterial infection or drainage bilaterally. ? ?Orthopedic  No limitations of motion  feet .  No crepitus or effusions noted.  No bony pathology or digital deformities noted.Dorsal lipping first metatarsal  B/L. Hallux limitus 1st MPJ right foot. ? ?Skin  normotropic skin with no porokeratosis noted bilaterally.  No signs of infections or ulcers noted.    ? ?Onychomycosis  Pain in right toes  Pain in left toes ? ?Consent was obtained for treatment procedures.   Mechanical debridement of nails 1-5  bilaterally performed with a nail nipper.  Filed with dremel without incident.  ? ? ?Return office visit     3 months                Told patient to return for periodic foot care and evaluation due to potential at risk complications. ? ? ?Gardiner Barefoot DPM  ?

## 2021-12-12 ENCOUNTER — Telehealth: Payer: Self-pay | Admitting: *Deleted

## 2021-12-12 ENCOUNTER — Ambulatory Visit (INDEPENDENT_AMBULATORY_CARE_PROVIDER_SITE_OTHER): Payer: HMO

## 2021-12-12 ENCOUNTER — Telehealth: Payer: Self-pay

## 2021-12-12 DIAGNOSIS — M8589 Other specified disorders of bone density and structure, multiple sites: Secondary | ICD-10-CM

## 2021-12-12 DIAGNOSIS — E1140 Type 2 diabetes mellitus with diabetic neuropathy, unspecified: Secondary | ICD-10-CM

## 2021-12-12 MED ORDER — FREESTYLE LANCETS MISC: 12 refills | Status: DC

## 2021-12-12 MED ORDER — FUROSEMIDE 40 MG PO TABS: 40.0000 mg | ORAL_TABLET | Freq: Every day | ORAL | 1 refills | Status: DC

## 2021-12-12 MED ORDER — FREESTYLE PRECISION NEO TEST VI STRP: ORAL_STRIP | 12 refills | Status: DC

## 2021-12-12 MED ORDER — LANCING DEVICE MISC: 0 refills | Status: DC

## 2021-12-12 NOTE — Progress Notes (Signed)
Chronic Care Management Pharmacy Note  12/12/2021 Name:  Teresa [name] MRN:  [id number] DOB:  1945/09/25  Summary: Patient presents for CCM follow-up.  -Nocturnal hypoglycemia every 3-4 days. Often occurs when working 2nd shift and not eating prior to bed. Likely caused by glimepiride on empty stomach.   Recommendations/Changes made from today's visit: -Counseled patient to always take glimepiride with food and Rule of 15.  -Continue current medications  Plan: CPP follow-up 3 months  Subjective: Teresa [name] is an 76 y.o. year old female who is a primary patient of [name], MD.  The CCM team was consulted for assistance with disease management and care coordination needs.    Engaged with patient by telephone for follow up visit in response to provider referral for pharmacy case management and/or care coordination services.   Consent to Services:  The patient was given information about Chronic Care Management services, agreed to services, and gave verbal consent prior to initiation of services.  Please see initial visit note for detailed documentation.   Patient Care Team: [name], MD as PCP - General (Family Medicine) Gardiner Barefoot, DPM as Consulting Physician (Podiatry) Yolonda Kida, MD as Consulting Physician (Cardiology) Vern Claude,  as Social Worker Birder Robson, MD as Referring Physician (Ophthalmology) Ottie Glazier, MD as Consulting Physician (Pulmonary Disease) Germaine Pomfret, [facility] as Pharmacist (Pharmacist) Melrose Nakayama, MD as Consulting Physician (Orthopedic Surgery)  Recent office visits: 01/12/21: Patient presented to Dr. Rosanna Randy for follow-up. A1c improved to 7.3%.  09/13/20:  Patient presented to [facility], LPN for AWV.    Recent consult visits: 07/25/21: Patient presented to Dr. Clayborn Bigness (cardiology) for syncope. Patient started on Eliquis for A-Fib.   Recent hospital visits: 06/13/21:  Patient hospitalized due to syncope.   Objective:  Lab Results  Component Value Date   CREATININE 0.59 06/18/2021   BUN 20 06/18/2021   GFRNONAA >60 06/18/2021   GFRAA 86 09/14/2020   NA 139 06/18/2021   K 3.9 06/18/2021   CALCIUM 10.1 11/02/2021   CO2 23 06/18/2021    Lab Results  Component Value Date/Time   HGBA1C 7.7 (A) 10/12/2021 11:52 AM   HGBA1C 8.2 (H) 05/16/2021 09:40 AM   HGBA1C 7.3 (A) 01/17/2021 05:11 PM   HGBA1C 7.4 (H) 09/18/2019 11:21 AM   HGBA1C 7.6 (H) 04/04/2013 10:47 AM   MICROALBUR 50 06/06/2017 11:26 AM   MICROALBUR 20 12/08/2015 10:39 AM    Last diabetic Eye exam:  Lab Results  Component Value Date/Time   HMDIABEYEEXA No Retinopathy 07/07/2020 12:00 AM    Last diabetic Foot exam: No results found for: HMDIABFOOTEX   Lab Results  Component Value Date   CHOL 182 05/16/2021   HDL 49 05/16/2021   LDLCALC 95 05/16/2021   TRIG 225 (H) 05/16/2021   CHOLHDL 3.7 05/16/2021       Latest Ref Rng & Units 06/14/2021    5:38 AM 05/16/2021    9:40 AM 09/14/2020    1:03 PM  Hepatic Function  Total Protein 6.5 - 8.1 g/dL 6.3   7.2   7.7    Albumin 3.5 - 5.0 g/dL 3.6   4.8   4.7    AST 15 - 41 U/L '29   24   24    ' ALT 0 - 44 U/L '23   20   25    ' Alk Phosphatase 38 - 126 U/L 84   115   87    Total  Bilirubin 0.3 - 1.2 mg/dL 0.8   0.4   0.5      Lab Results  Component Value Date/Time   TSH 1.450 06/14/2021 12:22 PM   TSH 1.338 06/14/2021 11:28 AM   TSH 1.230 05/16/2021 09:40 AM       Latest Ref Rng & Units 06/18/2021    4:55 AM 06/17/2021    6:56 AM 06/16/2021    4:32 AM  CBC  WBC 4.0 - 10.5 K/uL 14.9   9.0   13.7    Hemoglobin 12.0 - 15.0 g/dL 12.7   13.3   14.1    Hematocrit 36.0 - 46.0 % 38.9   40.2   42.5    Platelets 150 - 400 K/uL 189   194   219      Lab Results  Component Value Date/Time   VD25OH 20.4 (L) 11/02/2021 01:42 PM   VD25OH 10.16 (L) 06/14/2021 08:50 AM    Clinical ASCVD: Yes  The ASCVD Risk score (Arnett DK, et al.,  2019) failed to calculate for the following reasons:   The patient has a prior MI or stroke diagnosis       09/19/2021    9:34 AM 05/16/2021    8:32 AM 09/13/2020   10:19 AM  Depression screen PHQ 2/9  Decreased Interest '1 1 3  ' Down, Depressed, Hopeless '1 1 3  ' PHQ - 2 Score '2 2 6  ' Altered sleeping 0 1 3  Tired, decreased energy 0 1 3  Change in appetite 0 0 3  Feeling bad or failure about yourself  0 0 0  Trouble concentrating 0 0 0  Moving slowly or fidgety/restless 0 0 0  Suicidal thoughts 0 0 0  PHQ-9 Score '2 4 15  ' Difficult doing work/chores Not difficult at all Not difficult at all Not difficult at all     Social History   Tobacco Use  Smoking Status Never  Smokeless Tobacco Never   BP Readings from Last 3 Encounters:  11/01/21 125/71  10/12/21 103/64  06/20/21 (!) 157/76   Pulse Readings from Last 3 Encounters:  11/01/21 80  10/12/21 63  06/20/21 68   Wt Readings from Last 3 Encounters:  10/12/21 192 lb (87.1 kg)  09/19/21 177 lb 11.2 oz (80.6 kg)  06/20/21 198 lb 6.6 oz (90 kg)    Assessment/Interventions: Review of patient past medical history, allergies, medications, health status, including review of consultants reports, laboratory and other test data, was performed as part of comprehensive evaluation and provision of chronic care management services.   SDOH:  (Social Determinants of Health) assessments and interventions performed:       CCM Care Plan  Allergies  Allergen Reactions   Clarithromycin Nausea And Vomiting and Other (See Comments)   Codeine     Hallucination   Dilaudid  [Hydromorphone Hcl] Nausea And Vomiting   Iodine Hives and Itching   Iohexol Hives and Itching   Onion Diarrhea and Nausea And Vomiting    Abdominal pain   Tamiflu  [Oseltamivir Phosphate] Nausea And Vomiting    Abdominal Pain,   Zolpidem Nausea And Vomiting and Other (See Comments)    Hallucinations   Bacitracin-Neomycin-Polymyxin Itching and Rash    Benzalkonium Chloride Itching, Rash and Swelling   Lidocaine Hcl Itching, Rash and Swelling   Morphine Nausea And Vomiting and Rash   Neomycin-Bacitracin Zn-Polymyx Itching and Rash   Tape Itching and Rash    Adhesive tape - silicone   Tapentadol Rash  Medications Reviewed Today     Reviewed by Gardiner Barefoot, DPM (Physician) on 12/08/21 at [phone]  Med List Status: <None>   Medication Order Taking? Sig Documenting Provider Last Dose Status Informant  acetaminophen (TYLENOL) 500 MG tablet [id number] No Take 1,000 mg by mouth every 6 (six) hours as needed for mild pain or headache. [provider] Taking Active Self  albuterol (VENTOLIN HFA) 108 (90 Base) MCG/ACT inhaler [id number] No Inhale 2 puffs into the lungs every 6 (six) hours as needed for shortness of breath. [provider] Taking Active Self  allopurinol (ZYLOPRIM) 100 MG tablet [id number] No TAKE 1 TABLET BY MOUTH EVERY DAY [name], MD Taking Active Self  apixaban (ELIQUIS) 5 MG TABS tablet [id number] No Take 5 mg by mouth 2 (two) times daily. [provider] Taking Active Self  atorvastatin (LIPITOR) 10 MG tablet [id number] No TAKE 1 TABLET BY MOUTH EVERYDAY AT BEDTIME [name], MD Taking Active Self  colchicine 0.6 MG tablet [id number]  Take 0.6 mg by mouth daily as needed (gout). [provider]  Active Self  Continuous Blood Gluc Receiver (FREESTYLE LIBRE 2 READER) [place]VI [id number] No 1 each by Does not apply route continuous. [name], MD Taking Active Self  Continuous Blood Gluc Sensor ([address] [place] 2 SENSOR) [place] [id number] No USE AS DIRECTED [name], MD Taking Active Self  ELDERBERRY PO [id number]  Take 1 capsule by mouth daily. [provider]  Active Self  fluconazole (DIFLUCAN) 100 MG tablet [id number]  Take 1 tablet (100 mg total) by mouth daily.  Patient taking differently: Take 100 mg by mouth daily as needed (yeast  infections). Take for 5 days at onset of yeast infection   [name], MD  Active Self  furosemide (LASIX) 40 MG tablet [id number]  TAKE ONE TABLET BY MOUTH ONCE DAILY [name], MD  Active Self  gabapentin (NEURONTIN) 800 MG tablet [id number] No Take 800 mg by mouth 3 (three) times daily. [provider] Taking Active Self  glimepiride (AMARYL) 4 MG tablet [id number] No TAKE 1 TABLET BY MOUTH 2 TIMES DAILY. [name], MD Taking Active Self  JARDIANCE 25 MG TABS tablet [id number] No TAKE 25 MG BY MOUTH DAILY. [name], MD Taking Active Self  levothyroxine (SYNTHROID) 88 MCG tablet [id number] No TAKE 1 TABLET BY MOUTH EVERY DAY [name], MD Taking Active Self  metFORMIN (GLUCOPHAGE) 1000 MG tablet [id number] No TAKE 1 TABLET BY MOUTH TWICE A DAY [name], MD Taking Active Self  nystatin cream (MYCOSTATIN) [id number] No Apply 1 application topically 2 (two) times daily.  Patient taking differently: Apply 1 application. topically 2 (two) times daily as needed (for diabetic yeast reactions.).   [name], MD Taking Active Self  Omega-3 Fatty Acids (FISH OIL PO) [id number]  Take 1 capsule by mouth daily. [provider]  Active Self  ondansetron (ZOFRAN) 4 MG tablet [id number] No TAKE 1 TABLET BY MOUTH EVERY 8 HOURS AS NEEDED FOR NAUSEA AND VOMITING [name], MD Taking Active Self  pioglitazone (ACTOS) 30 MG tablet [id number] No TAKE 1 TABLET BY MOUTH EVERY DAY [name], MD Taking Active Self  predniSONE (DELTASONE) 50 MG tablet [id number]  Take 50 mg by mouth See admin instructions. Take 50 mg 13 hrs, 7 hrs, and 1 hour prior to study [provider]  Active Self  Med Note Jilda Roche A   Wed Oct 26, 2021  1:41 PM) For 3/24  Semaglutide, 1 MG/DOSE, 4 MG/3ML SOPN [id number]  Inject 1 mg as directed once a week. [name], MD  Active Self            Med Note Caryn Section, [place] A   Wed Oct 26, 2021  1:39 PM) Thursday   sulfamethoxazole-trimethoprim (BACTRIM) 400-80 MG tablet [id number] No Take 1 tablet by mouth every Monday, Wednesday, and Friday. [provider] Taking Active Self           Med Note Caryn Section, KYLE A   Wed Oct 26, 2021  1:41 PM)    Vitamin D, Ergocalciferol, (DRISDOL) 1.25 MG (50000 UNIT) CAPS capsule [id number]  Take 1 capsule (50,000 Units total) by mouth every 7 (seven) days. Criselda Peaches, DPM  Active             Patient Active Problem List   Diagnosis Date Noted   Osteopenia of multiple sites 12/12/2021   Blood clotting disorder ([place]) 08/25/2021   Atrial fibrillation ([place]) 08/09/2021   Syncope and collapse 06/13/2021   Hypokalemia 06/13/2021   Oxygen desaturation 06/24/2019   Encounter for examination following treatment at hospital 06/24/2019   History of 2019 novel coronavirus disease (COVID-19) 06/24/2019   Pneumonia due to COVID-19 virus 05/16/2019   Intractable nausea and vomiting 05/16/2019   Chronic diastolic CHF (congestive heart failure) ([place]) 05/16/2019   Encounter for screening colonoscopy    Benign neoplasm of cecum    Diverticulosis of large intestine without diverticulitis    Tortuous colon    Polyp of sigmoid colon    Intestinal lump    Polyneuropathy associated with underlying disease ([place]) 12/11/2016   Microcalcifications of the breast 12/15/2015   Back pain, chronic 06/28/2015   Chronic airway obstruction ([place]) 06/28/2015   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   Essential hypertension 06/21/2009   CAD (coronary artery disease) 06/21/2009   FEVER UNSPECIFIED 06/21/2009   NAUSEA 06/21/2009   DYSPHAGIA 06/21/2009   RUQ PAIN 06/21/2009   HIATAL HERNIA 01/07/2008   Hypothyroidism 11/22/2007   Anxiety state 11/22/2007   DEPRESSION 11/22/2007   Hypertensive heart disease without heart failure 11/22/2007   ANGINA PECTORIS 11/22/2007   RENAL CALCULUS 11/22/2007    Osteoarthritis 11/22/2007   Sleep apnea 11/22/2007   HEADACHE, CHRONIC 11/22/2007   GASTRITIS 10/23/2007   DIVERTICULOSIS, COLON 10/23/2007   Type 2 diabetes mellitus with diabetic neuropathy, without long-term current use of insulin ([place]) 07/16/2007   HYPERCHOLESTEROLEMIA 07/16/2007   OBESITY, MODERATE 07/16/2007   ISCHEMIC HEART DISEASE 07/16/2007   ESOPHAGEAL REFLUX 07/16/2007   SCLERODERMA 07/16/2007   DYSPNEA 07/16/2007   COUGH, CHRONIC 07/16/2007    Immunization History  Administered Date(s) Administered   Fluad Quad(high Dose 65+) 05/27/2019, 06/01/2020, 05/16/2021   Influenza, High Dose Seasonal PF 05/29/2016, 06/06/2017, 05/20/2018   PFIZER Comirnaty(Gray Top)Covid-19 Tri-Sucrose Vaccine 10/05/2020, 01/12/2021   PFIZER(Purple Top)SARS-COV-2 Vaccination 09/14/2020   Pfizer Covid-19 Vaccine Bivalent Booster 45yr & up 09/15/2021   Pneumococcal Conjugate-13 05/21/2014   Pneumococcal Polysaccharide-23 11/29/2004, 07/11/2010, 12/08/2015, 05/27/2019   Td 07/05/1994   Tdap 05/11/2011   Zoster, Live 05/07/2008    Conditions to be addressed/monitored: Hypertension, Hyperlipidemia, Diabetes, Atrial Fibrillation, Coronary Artery Disease, Hypothyroidism, Depression, and Anxiety  Care Plan : General Pharmacy (Adult)  Updates made by FGermaine Pomfret RPH since 12/12/2021 12:00 AM     Problem: Hypertension, Hyperlipidemia, Diabetes, Atrial Fibrillation, Coronary Artery Disease, Hypothyroidism, Depression, and  Anxiety   Priority: High     Long-Range Goal: Patient-Specific Goal   Start Date: 09/15/2020  Expected End Date: 04/28/2023  This Visit's Progress: On track  Recent Progress: On track  Priority: High  Note:   Current Barriers:  No barriers noted   Pharmacist Clinical Goal(s):  Over the next 90 days, patient will maintain control of Diabetes as evidenced by A1c less than 8%  through collaboration with PharmD and provider.   Interventions: 1:1 collaboration with  [name], MD regarding development and update of comprehensive plan of care as evidenced by provider attestation and co-signature Inter-disciplinary care team collaboration (see longitudinal plan of care) Comprehensive medication review performed; medication list updated in electronic medical record  Hypertension (BP goal <140/90) -controlled -Current treatment: Furosemide 40 mg daily  -Medications previously tried: HCTZ  -Current home readings: 120/72, 106/68  -Denies hypotensive/hypertensive symptoms Recommended to continue current medication  Hyperlipidemia: (LDL goal < 70) -uncontrolled -Current treatment: Atorvastatin 10 mg daily  -Medications previously tried: NA  -Recommended to continue current medication  Atrial Fibrillation (Goal: prevent stroke and major bleeding) -Controlled -CHADSVASC: 5 -Current treatment: Rate control: None Anticoagulation: Eliquis 5 mg twice daily: Appropriate, Effective, Safe, Accessible -Counseled on increased risk of stroke due to Afib and benefits of anticoagulation for stroke prevention; -Recommended to continue current medication  Diabetes (A1c goal <8%) -Controlled -Current medications: Glimepiride 4 mg twice daily: Appropriate, Effective, Query Safe,  Jardiance 25 mg daily: Appropriate, Effective, Safe, Accessible Metformin 1000 mg twice daily: Appropriate, Effective, Safe, Accessible Ozempic 1 mg weekly (Thursdays): Appropriate, Effective, Safe, Inaccessible Pioglitazone 30 mg daily: Appropriate, Effective, Safe, Accessible  -Medications previously tried: Januvia  -Current home glucose readings  Target 12/20-1/3 1/31-2/13 4/25-5/8  % of time active ? 70% NA 87% 81%  Mean Glucose (mg/dL)  170 184 145  GMI (2-week A1c estimate)  NA 7.7% 6.8%  Glycemic Variability (%CV) ?36% NA 38.4% 30%  Time above >250 mg/dL <10% NA 19% 2%  Time above 70-180 mg/dL <50% 37 27% 17%  Time in range: 70-180 mg/dL >50% 63 54% 80%  Time  below 70 mg/dL <1% 0 0% 1%  --Nocturnal hypoglycemia every 3-4 days. Often occurs when working 2nd shift and not eating prior to bed. Likely caused by glimepiride on empty stomach.  -Counseled patient to always take glimepiride with food and Rule of 15.  -Continue current medications   Osteopenia (Goal Prevent bone fractures) -Uncontrolled -Last DEXA Scan: 11/08/21   T-Score femoral neck: -1.8  T-Score total hip: -1.3  T-Score lumbar spine: -0.3  T-Score forearm radius: -1.1  10-year probability of major osteoporotic fracture: 18%  10-year probability of hip fracture: 3.9% -Patient is a candidate for pharmacologic treatment due to T-Score -1.0 to -2.5 and 10-year risk of hip fracture > 3% -Current treatment  Ergocalciferol 50,000 units weekly  -Medications previously tried: NA  -Recommend 1200 mg of calcium daily from dietary and supplemental sources. Patient to increase dietary calcium by one serving daily.  -Recommend alendronate given high risk of hip fracture, will defer until after foot surgery in July.  -Recommended to continue current medication  Patient Goals/Self-Care Activities Over the next 90 days, patient will:  - check glucose at least every 8 hours, document, and provide at future appointments  Follow Up Plan: Face to Face appointment with care management team member scheduled for: 03/27/2022 at 8:30 AM     Medication Assistance:  Patient is currently enrolled in full LIS coverage, so does not  qualify for patient assistance applications.  Patient's preferred pharmacy is:  [facility] - [place], [place] - [address] [place] [address] Phone: [phone] Fax: [phone]   Uses pill box? Yes Pt endorses 100% compliance  Patient decided to: Utilize [facility] for medication synchronization, packaging and delivery  Follow Up:  Patient agrees to Care Plan and Follow-up.  Plan: Face to Face  appointment with care management team member scheduled for: 03/27/2022 at 8:30 AM  [name], PharmD, [facility] [place], [place] [phone]

## 2021-12-12 NOTE — Telephone Encounter (Signed)
"  I was reading my after summary visit for 11/30/2021, they had the right ankle in the note.  It was not the right ankle.  It is the left ankle that he is going to do surgery on.  We need to make sure that is corrected.  Thank you." ?

## 2021-12-12 NOTE — Progress Notes (Signed)
[facility], we filled Ozempic  '1mg'$  weekly dose on 11/04/21 so she should not be out. We cant fill that through her insurance until the end of next month. Patient may be using it incorrectly or is missing a box of it. ? ?Per Clinical pharmacist, She did not mention anything regarding the Ozempic to me today.Please can you have her check again if she has an extra box of the 1 mg ozempic pen. ? ?Patient states  she has one pen left  on hand that she will open on  Thursday 12/15/2021, and will use the rest next Thursday on 12/22/2021 then she will be out. Patient confirm she said she is using 1 mg every Thursday evening and that she needs a refill. Patient reports she does not have a extra box. Notified Clinical pharmacist, and [facility]. ? ?Anderson Malta ?Clinical Pharmacist Assistant ?[phone]  ?  ?

## 2021-12-12 NOTE — Patient Instructions (Signed)
Visit Information ?It was great speaking with you today!  Please let me know if you have any questions about our visit. ? ?Plan:  ?ALWAYS take glimepiride with a meal ?Follow the rule of 15 for treating low blood sugars (eat 15 grams and recheck in 15 minutes)  ?Aim for 2-3 servings of diary products daily  ? ?Print copy of patient instructions, educational materials, and care plan provided in person.  ?Face to Face appointment with pharmacist scheduled for:  03/27/2022 at 8:30 AM ? ?[name], PharmD, BCACP, CPP  ?Clinical Pharmacist Practitioner  ?[place] ?[phone] ?  ?

## 2021-12-13 NOTE — Telephone Encounter (Signed)
Left is correct thanks

## 2021-12-13 NOTE — Addendum Note (Signed)
Addended by: Daron Offer A on: 12/13/2021 12:24 PM ? ? Modules accepted: Orders ? ?

## 2021-12-14 ENCOUNTER — Encounter: Payer: Self-pay | Admitting: Podiatry

## 2021-12-15 NOTE — Telephone Encounter (Signed)
Please advise 

## 2021-12-19 DIAGNOSIS — E782 Mixed hyperlipidemia: Secondary | ICD-10-CM | POA: Diagnosis not present

## 2021-12-19 DIAGNOSIS — R42 Dizziness and giddiness: Secondary | ICD-10-CM | POA: Diagnosis not present

## 2021-12-19 DIAGNOSIS — I272 Pulmonary hypertension, unspecified: Secondary | ICD-10-CM | POA: Diagnosis not present

## 2021-12-19 DIAGNOSIS — I5032 Chronic diastolic (congestive) heart failure: Secondary | ICD-10-CM | POA: Diagnosis not present

## 2021-12-19 DIAGNOSIS — J449 Chronic obstructive pulmonary disease, unspecified: Secondary | ICD-10-CM | POA: Diagnosis not present

## 2021-12-19 DIAGNOSIS — R002 Palpitations: Secondary | ICD-10-CM | POA: Diagnosis not present

## 2021-12-19 DIAGNOSIS — I4891 Unspecified atrial fibrillation: Secondary | ICD-10-CM | POA: Diagnosis not present

## 2021-12-19 DIAGNOSIS — E119 Type 2 diabetes mellitus without complications: Secondary | ICD-10-CM | POA: Diagnosis not present

## 2021-12-19 DIAGNOSIS — G4730 Sleep apnea, unspecified: Secondary | ICD-10-CM | POA: Diagnosis not present

## 2021-12-19 DIAGNOSIS — I2510 Atherosclerotic heart disease of native coronary artery without angina pectoris: Secondary | ICD-10-CM | POA: Diagnosis not present

## 2021-12-19 DIAGNOSIS — R079 Chest pain, unspecified: Secondary | ICD-10-CM | POA: Diagnosis not present

## 2021-12-19 DIAGNOSIS — R0602 Shortness of breath: Secondary | ICD-10-CM | POA: Diagnosis not present

## 2021-12-21 DIAGNOSIS — H43813 Vitreous degeneration, bilateral: Secondary | ICD-10-CM | POA: Diagnosis not present

## 2021-12-21 LAB — HM DIABETES EYE EXAM

## 2021-12-22 DIAGNOSIS — E559 Vitamin D deficiency, unspecified: Secondary | ICD-10-CM | POA: Diagnosis not present

## 2021-12-23 ENCOUNTER — Encounter: Payer: Self-pay | Admitting: Podiatry

## 2021-12-23 ENCOUNTER — Other Ambulatory Visit: Payer: Self-pay | Admitting: Podiatry

## 2021-12-23 LAB — VITAMIN D 25 HYDROXY (VIT D DEFICIENCY, FRACTURES): Vit D, 25-Hydroxy: 40.6 ng/mL (ref 30.0–100.0)

## 2021-12-26 ENCOUNTER — Encounter: Payer: Self-pay | Admitting: Podiatry

## 2021-12-26 DIAGNOSIS — M19172 Post-traumatic osteoarthritis, left ankle and foot: Secondary | ICD-10-CM

## 2021-12-28 ENCOUNTER — Telehealth: Payer: Self-pay | Admitting: Podiatry

## 2021-12-28 DIAGNOSIS — R2689 Other abnormalities of gait and mobility: Secondary | ICD-10-CM

## 2021-12-28 NOTE — Telephone Encounter (Signed)
Printed RX called pt she is coming this am to pick it up.

## 2021-12-28 NOTE — Telephone Encounter (Signed)
Pt wanting to know if her prescription for the scooter is ready to pick up from the office.

## 2022-01-03 NOTE — Progress Notes (Unsigned)
Electrophysiology Office Note:    Date:  01/04/2022   ID:  Teresa [name], DOB [birth year], MRN [id number]  PCP:  Teresa Banana., MD  [facility] Cardiologist:  None  [facility] Electrophysiologist:  Teresa Epley, MD   Referring MD: Teresa Banana.,*   Chief Complaint: AF  History of Present Illness:    Teresa [name] is a 76 y.o. female who presents for an evaluation of Af at the request of Dr Teresa [name]. Their medical history includes obesity, COPD, seizure disorder. She last saw Dr Teresa [name] 12/19/2021. She takes eliquis for stroke ppx.  She tells me she is symptomatic when in atrial fibrillation.  She tells me that she feels her chest vibrating.  The sensation goes into her left axillary area and then into her left arm.  She actually shows me motion of her left arm that she appreciates when in atrial fibrillation.  It is unclear to me whether or not this is truly atrial fibrillation or could this be musculoskeletal in origin.  She describes fatigue and shortness of breath with exertion.  Also lower extremity edema.  She has a planned surgery July 28 for her left ankle.  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   Past Medical History:  Diagnosis Date   Arthritis    CHF (congestive heart failure) (HCC)    Complication of anesthesia    Coronary artery disease    Depression    Diabetes mellitus without complication (HCC)    Dyspnea    DOE   Dysrhythmia    Edema    FEET/LEGS   GERD (gastroesophageal reflux disease)    Gout    Headache    Heart murmur    History of hiatal hernia    History of kidney stones    History of orthopnea    Hypertension    Hypothyroidism    PONV (postoperative nausea and vomiting)    Scleroderma (HCC)    hands   Sleep apnea    NO CPAP   Thyroid disease    Vertigo     Past Surgical History:  Procedure Laterality Date   ABDOMINAL HYSTERECTOMY     ovaries intact   BLADDER SURGERY     bladder tuck   BREAST  BIOPSY Right 03/20/2016   neg x 2 area   CARDIAC CATHETERIZATION     CARPAL TUNNEL RELEASE     CATARACT EXTRACTION W/PHACO Left 09/17/2018   Procedure: CATARACT EXTRACTION PHACO AND INTRAOCULAR LENS PLACEMENT ([place]) LEFT, DIABETIC;  Surgeon: Teresa Robson, MD;  Location: [facility] ORS;  Service: Ophthalmology;  Laterality: Left;  [place] 00:34 CDE 4.85 Fluid pack lot # [id number]   CATARACT EXTRACTION W/PHACO Right 10/15/2018   Procedure: CATARACT EXTRACTION PHACO AND INTRAOCULAR LENS PLACEMENT ([facility])-RIGHT;  Surgeon: Teresa Robson, MD;  Location: [facility] ORS;  Service: Ophthalmology;  Laterality: Right;  [place] 00:27.6 CDE 3.43 Fluid Pack Lot # [id number]   COLONOSCOPY WITH PROPOFOL N/A 09/27/2018   Procedure: COLONOSCOPY WITH PROPOFOL;  Surgeon: Virgel Manifold, MD;  Location: [facility] ENDOSCOPY;  Service: Endoscopy;  Laterality: N/A;   DILATION AND CURETTAGE OF UTERUS     EYE SURGERY     eyelid   FRACTURE SURGERY     left ankle-plate and screws palced   LITHOTRIPSY     NISSEN FUNDOPLICATION     SHOULDER SURGERY Right    TONSILLECTOMY      Current Medications: Current Meds  Medication Sig  acetaminophen (TYLENOL) 500 MG tablet Take 1,000 mg by mouth every 6 (six) hours as needed for mild pain or headache.   albuterol (VENTOLIN HFA) 108 (90 Base) MCG/ACT inhaler Inhale 2 puffs into the lungs every 6 (six) hours as needed for shortness of breath.   allopurinol (ZYLOPRIM) 100 MG tablet TAKE 1 TABLET BY MOUTH EVERY DAY   apixaban (ELIQUIS) 5 MG TABS tablet Take 5 mg by mouth 2 (two) times daily.   atorvastatin (LIPITOR) 10 MG tablet TAKE 1 TABLET BY MOUTH EVERYDAY AT BEDTIME   colchicine 0.6 MG tablet Take 0.6 mg by mouth daily as needed (gout).   Continuous Blood Gluc Receiver (FREESTYLE LIBRE 2 READER) DEVI 1 each by Does not apply route continuous.   Continuous Blood Gluc Sensor (FREESTYLE LIBRE 2 SENSOR) MISC USE AS DIRECTED   ELDERBERRY PO Take 1 capsule by mouth daily.   furosemide (LASIX)  40 MG tablet Take 1 tablet (40 mg total) by mouth daily.   gabapentin (NEURONTIN) 800 MG tablet Take 800 mg by mouth 3 (three) times daily.   glimepiride (AMARYL) 4 MG tablet TAKE 1 TABLET BY MOUTH 2 TIMES DAILY.   JARDIANCE 25 MG TABS tablet TAKE 25 MG BY MOUTH DAILY.   levothyroxine (SYNTHROID) 88 MCG tablet TAKE 1 TABLET BY MOUTH EVERY DAY   metFORMIN (GLUCOPHAGE) 1000 MG tablet TAKE 1 TABLET BY MOUTH TWICE A DAY   nystatin cream (MYCOSTATIN) Apply 1 application topically 2 (two) times daily.   Omega-3 Fatty Acids (FISH OIL PO) Take 1 capsule by mouth daily.   ondansetron (ZOFRAN) 4 MG tablet TAKE 1 TABLET BY MOUTH EVERY 8 HOURS AS NEEDED FOR NAUSEA AND VOMITING   pioglitazone (ACTOS) 30 MG tablet TAKE 1 TABLET BY MOUTH EVERY DAY   Semaglutide, 1 MG/DOSE, 4 MG/3ML SOPN Inject 1 mg as directed once a week.   Vitamin D, Ergocalciferol, (DRISDOL) 1.25 MG (50000 UNIT) CAPS capsule TAKE ONE CAPSULE BY MOUTH ONCE WEEKLY ON SUNDAY     Allergies:   Clarithromycin, Codeine, Dilaudid  [hydromorphone hcl], Iodine, Iohexol, Onion, Tamiflu  [oseltamivir phosphate], Zolpidem, Bacitracin-neomycin-polymyxin, Benzalkonium chloride, Lidocaine hcl, Morphine, Neomycin-bacitracin zn-polymyx, Tape, and Tapentadol   Social History   Socioeconomic History   Marital status: Divorced    Spouse name: Not on file   Number of children: 2   Years of education: Not on file   Highest education level: Associate degree: occupational, Hotel manager, or vocational program  Occupational History   Occupation: retired  Tobacco Use   Smoking status: Never   Smokeless tobacco: Never  Vaping Use   Vaping Use: Never used  Substance and Sexual Activity   Alcohol use: Never   Drug use: Never   Sexual activity: Not on file  Other Topics Concern   Not on file  Social History Narrative   Not on file   Social Determinants of Health   Financial Resource Strain: Low Risk    Difficulty of Paying Living Expenses: Not hard at  all  Food Insecurity: Food Insecurity Present   Worried About Charity fundraiser in the Last Year: Sometimes true   Arboriculturist in the Last Year: Never true  Transportation Needs: No Transportation Needs   Lack of Transportation (Medical): No   Lack of Transportation (Non-Medical): No  Physical Activity: Sufficiently Active   Days of Exercise per Week: 7 days   Minutes of Exercise per Session: 30 min  Stress: Stress Concern Present   Feeling of Stress :  To some extent  Social Connections: Moderately Integrated   Frequency of Communication with Friends and Family: More than three times a week   Frequency of Social Gatherings with Friends and Family: More than three times a week   Attends Religious Services: More than 4 times per year   Active Member of [employer] or Organizations: Yes   Attends Music therapist: More than 4 times per year   Marital Status: Divorced     Family History: The patient's family history includes Arthritis in her mother; Asthma in her sister; Breast cancer in her maternal aunt and maternal grandmother; Colon cancer in her maternal grandmother; Diabetes in her sister; Diverticulitis in her sister; Heart disease in her father and mother; Hypertension in her father, mother, sister, and sister; Stroke in her mother.  ROS:   Please see the history of present illness.    All other systems reviewed and are negative.  EKGs/Labs/Other Studies Reviewed:    The following studies were reviewed today:  11/02/2021 CT Coronary CA Score 174bpm, 67th percentile  06/14/2021 Echo EF 60% RV normal Trivial MR  06/15/2021 ECG - RBBB, first degree av delay      EKG:  The ekg ordered today demonstrates sinus rhythm, right bundle branch block, leftward axis   Recent Labs: 06/13/2021: B Natriuretic Peptide 18.5 06/14/2021: ALT 23; TSH 1.450 06/18/2021: BUN 20; Creatinine, Ser 0.59; Hemoglobin 12.7; Magnesium 1.6; Platelets 189; Potassium 3.9; Sodium 139   Recent Lipid Panel    Component Value Date/Time   CHOL 182 05/16/2021 0940   CHOL 200 04/04/2013 1047   TRIG 225 (H) 05/16/2021 0940   TRIG 191 04/04/2013 1047   HDL 49 05/16/2021 0940   HDL 45 04/04/2013 1047   CHOLHDL 3.7 05/16/2021 0940   VLDL 38 04/04/2013 1047   LDLCALC 95 05/16/2021 0940   LDLCALC 117 (H) 04/04/2013 1047    Physical Exam:    VS:  BP 116/76   Pulse 61   Ht '5\' 2"'$  (1.575 m)   Wt 188 lb (85.3 kg)   SpO2 95%   BMI 34.39 kg/m     Wt Readings from Last 3 Encounters:  01/04/22 188 lb (85.3 kg)  10/12/21 192 lb (87.1 kg)  09/19/21 177 lb 11.2 oz (80.6 kg)     GEN:  Well nourished, well developed in no acute distress HEENT: Normal NECK: No JVD; No carotid bruits LYMPHATICS: No lymphadenopathy CARDIAC: RRR, no murmurs, rubs, gallops RESPIRATORY:  Clear to auscultation without rales, wheezing or rhonchi  ABDOMEN: Soft, non-tender, non-distended MUSCULOSKELETAL:  No edema; No deformity  SKIN: Warm and dry NEUROLOGIC:  Alert and oriented x 3 PSYCHIATRIC:  Normal affect       ASSESSMENT:    1. Paroxysmal atrial fibrillation (HCC)   2. Chronic diastolic CHF (congestive heart failure) ([place])   3. Coronary artery disease involving native coronary artery of native heart without angina pectoris    PLAN:    In order of problems listed above:   #Paroxysmal atrial fibrillation Is not clear to me how symptomatic she is when in atrial fibrillation.  Her prior heart monitor referred to "mostly sinus rhythm" with some atrial fibrillation episodes.  She is on Eliquis for stroke prophylaxis.  I discussed treatment strategies for her atrial fibrillation during today's visit.  I discussed rhythm control options including antiarrhythmic drugs and catheter ablation.  Given her conduction disease our antiarrhythmic options will be limited to cautious use of Tikosyn and amiodarone.  First, I would  like to better understand how much atrial fibrillation she is actually  having and whether or not her episodes of atrial fibrillation correlate to her symptoms.  With the musculoskeletal complaints she describes today it is unclear to me whether or not this is truly representative of atrial fibrillation.  I will have her wear a 2-week ZIO monitor to better quantify and correlate symptoms to arrhythmia.    #Chronic diastolic heart failure NYHA class I-II.  Rhythm control will be the preferred strategy moving forward.  ZIO monitor as above to help guide decision on how to achieve rhythm control.  #CAD   Medication Adjustments/Labs and Tests Ordered: Current medicines are reviewed at length with the patient today.  Concerns regarding medicines are outlined above.  Orders Placed This Encounter  Procedures   LONG TERM MONITOR (3-14 DAYS)   No orders of the defined types were placed in this encounter.    Signed, Hilton Cork. Quentin Ore, MD, [facility], [facility] 01/04/2022 9:08 AM    Electrophysiology [facility]

## 2022-01-04 ENCOUNTER — Ambulatory Visit (INDEPENDENT_AMBULATORY_CARE_PROVIDER_SITE_OTHER): Payer: HMO

## 2022-01-04 ENCOUNTER — Ambulatory Visit: Payer: HMO | Admitting: Cardiology

## 2022-01-04 ENCOUNTER — Encounter: Payer: Self-pay | Admitting: Cardiology

## 2022-01-04 VITALS — BP 116/76 | HR 61 | Ht 62.0 in | Wt 188.0 lb

## 2022-01-04 DIAGNOSIS — I4891 Unspecified atrial fibrillation: Secondary | ICD-10-CM

## 2022-01-04 DIAGNOSIS — E1169 Type 2 diabetes mellitus with other specified complication: Secondary | ICD-10-CM

## 2022-01-04 DIAGNOSIS — I10 Essential (primary) hypertension: Secondary | ICD-10-CM

## 2022-01-04 DIAGNOSIS — I5032 Chronic diastolic (congestive) heart failure: Secondary | ICD-10-CM

## 2022-01-04 DIAGNOSIS — I2510 Atherosclerotic heart disease of native coronary artery without angina pectoris: Secondary | ICD-10-CM | POA: Diagnosis not present

## 2022-01-04 DIAGNOSIS — I480 Paroxysmal atrial fibrillation: Secondary | ICD-10-CM

## 2022-01-04 DIAGNOSIS — Z7984 Long term (current) use of oral hypoglycemic drugs: Secondary | ICD-10-CM | POA: Diagnosis not present

## 2022-01-04 DIAGNOSIS — Z7985 Long-term (current) use of injectable non-insulin antidiabetic drugs: Secondary | ICD-10-CM | POA: Diagnosis not present

## 2022-01-04 DIAGNOSIS — M8580 Other specified disorders of bone density and structure, unspecified site: Secondary | ICD-10-CM

## 2022-01-04 DIAGNOSIS — E785 Hyperlipidemia, unspecified: Secondary | ICD-10-CM

## 2022-01-04 NOTE — Patient Instructions (Addendum)
Medications: Your physician recommends that you continue on your current medications as directed. Please refer to the Current Medication list given to you today. *If you need a refill on your cardiac medications before your next appointment, please call your pharmacy*  Lab Work: None. If you have labs (blood work) drawn today and your tests are completely normal, you will receive your results only by: [place] (if you have MyChart) OR A paper copy in the mail If you have any lab test that is abnormal or we need to change your treatment, we will call you to review the results.  Testing/Procedures: None.  Follow-Up: At [facility], you and your health needs are our priority.  As part of our continuing mission to provide you with exceptional heart care, we have created designated Provider Care Teams.  These Care Teams include your primary Cardiologist (physician) and Advanced Practice Providers (APPs -  Physician Assistants and Nurse Practitioners) who all work together to provide you with the care you need, when you need it.  Your physician wants you to follow-up in: Aug-Sept months with Lars Mage, MD     You will receive a reminder letter in the mail two months in advance. If you don't receive a letter, please call our office to schedule the follow-up appointment.  We recommend signing up for the patient portal called "MyChart".  Sign up information is provided on this After Visit Summary.  MyChart is used to connect with patients for Virtual Visits (Telemedicine).  Patients are able to view lab/test results, encounter notes, upcoming appointments, etc.  Non-urgent messages can be sent to your provider as well.   To learn more about what you can do with MyChart, go to [email or web address].    Any Other Special Instructions Will Be Listed Below (If Applicable).   ZIO XT- Long Term Monitor Instructions  Your physician has requested you wear a ZIO patch monitor for 14 days.   This is a single patch monitor. [employer] supplies one patch monitor per enrollment. Additional stickers are not available. Please do not apply patch if you will be having a Nuclear Stress Test,  Echocardiogram, Cardiac CT, MRI, or Chest Xray during the period you would be wearing the  monitor. The patch cannot be worn during these tests. You cannot remove and re-apply the  ZIO XT patch monitor.  Your ZIO patch monitor will be mailed 3 day USPS to your address on file. It may take 3-5 days  to receive your monitor after you have been enrolled.  Once you have received your monitor, please review the enclosed instructions. Your monitor  has already been registered assigning a specific monitor serial # to you.  Billing and Patient Assistance Program Information  We have supplied [employer] with any of your insurance information on file for billing purposes. [employer] offers a sliding scale Patient Assistance Program for patients that do not have  insurance, or whose insurance does not completely cover the cost of the ZIO monitor.  You must apply for the Patient Assistance Program to qualify for this discounted rate.  To apply, please call [employer] at [phone], select option 4, select option 2, ask to apply for  Patient Assistance Program. Theodore Demark will ask your household income, and how many people  are in your household. They will quote your out-of-pocket cost based on that information.  [employer] will also be able to set up a 47-month interest-free payment plan if needed.  Applying the monitor   Shave hair from upper  left chest.  Hold abrader disc by orange tab. Rub abrader in 40 strokes over the upper left chest as  indicated in your monitor instructions.  Clean area with 4 enclosed alcohol pads. Let dry.  Apply patch as indicated in monitor instructions. Patch will be placed under collarbone on left  side of chest with arrow pointing upward.  Rub patch adhesive wings for 2 minutes. Remove  white label marked "1". Remove the white  label marked "2". Rub patch adhesive wings for 2 additional minutes.  While looking in a mirror, press and release button in center of patch. A small green light will  flash 3-4 times. This will be your only indicator that the monitor has been turned on.  Do not shower for the first 24 hours. You may shower after the first 24 hours.  Press the button if you feel a symptom. You will hear a small click. Record Date, Time and  Symptom in the Patient Logbook.  When you are ready to remove the patch, follow instructions on the last 2 pages of Patient  Logbook. Stick patch monitor onto the last page of Patient Logbook.  Place Patient Logbook in the blue and white box. Use locking tab on box and tape box closed  securely. The blue and white box has prepaid postage on it. Please place it in the mailbox as  soon as possible. Your physician should have your test results approximately 7 days after the  monitor has been mailed back to [facility].  Call [place] at [phone] if you have questions regarding  your ZIO XT patch monitor. Call them immediately if you see an orange light blinking on your  monitor.  If your monitor falls off in less than 4 days, contact our Monitor department at [phone].  If your monitor becomes loose or falls off after 4 days call [employer] at [phone] for  suggestions on securing your monitor

## 2022-01-04 NOTE — Addendum Note (Signed)
Addended by: Anselm Pancoast on: 01/04/2022 03:51 PM   Modules accepted: Orders

## 2022-01-05 ENCOUNTER — Telehealth: Payer: Self-pay

## 2022-01-05 NOTE — Progress Notes (Signed)
Chronic Care Management Pharmacy Assistant   Name: Teresa [name]  MRN: [id number] DOB: 1945/09/15  Reason for Encounter: Diabetes Disease State Call.   Recent office visits:  No recent office visit  Recent consult visits:  01/04/2022 Dr. Clayborn Bigness MD (Cardiology) No Medication changes noted 12/23/2021 Lanae Crumbly DPM (Podiatry) start Vitamin D 1,000 units daily 12/19/2021 Dr. Clayborn Bigness MD (Cardiology) No Medication changes noted, return in 4 months  Hospital visits:  None in previous 6 months  Medications: Outpatient Encounter Medications as of 01/05/2022  Medication Sig   acetaminophen (TYLENOL) 500 MG tablet Take 1,000 mg by mouth every 6 (six) hours as needed for mild pain or headache.   albuterol (VENTOLIN HFA) 108 (90 Base) MCG/ACT inhaler Inhale 2 puffs into the lungs every 6 (six) hours as needed for shortness of breath.   allopurinol (ZYLOPRIM) 100 MG tablet TAKE 1 TABLET BY MOUTH EVERY DAY   apixaban (ELIQUIS) 5 MG TABS tablet Take 5 mg by mouth 2 (two) times daily.   atorvastatin (LIPITOR) 10 MG tablet TAKE 1 TABLET BY MOUTH EVERYDAY AT BEDTIME   colchicine 0.6 MG tablet Take 0.6 mg by mouth daily as needed (gout).   Continuous Blood Gluc Receiver (FREESTYLE LIBRE 2 READER) DEVI 1 each by Does not apply route continuous.   Continuous Blood Gluc Sensor (FREESTYLE LIBRE 2 SENSOR) MISC USE AS DIRECTED   ELDERBERRY PO Take 1 capsule by mouth daily.   furosemide (LASIX) 40 MG tablet Take 1 tablet (40 mg total) by mouth daily.   gabapentin (NEURONTIN) 800 MG tablet Take 800 mg by mouth 3 (three) times daily.   glimepiride (AMARYL) 4 MG tablet TAKE 1 TABLET BY MOUTH 2 TIMES DAILY.   JARDIANCE 25 MG TABS tablet TAKE 25 MG BY MOUTH DAILY.   levothyroxine (SYNTHROID) 88 MCG tablet TAKE 1 TABLET BY MOUTH EVERY DAY   metFORMIN (GLUCOPHAGE) 1000 MG tablet TAKE 1 TABLET BY MOUTH TWICE A DAY   nystatin cream (MYCOSTATIN) Apply 1 application topically 2 (two) times daily.   Omega-3  Fatty Acids (FISH OIL PO) Take 1 capsule by mouth daily.   ondansetron (ZOFRAN) 4 MG tablet TAKE 1 TABLET BY MOUTH EVERY 8 HOURS AS NEEDED FOR NAUSEA AND VOMITING   pioglitazone (ACTOS) 30 MG tablet TAKE 1 TABLET BY MOUTH EVERY DAY   predniSONE (DELTASONE) 50 MG tablet Take 50 mg by mouth See admin instructions. Take 50 mg 13 hrs, 7 hrs, and 1 hour prior to study   Semaglutide, 1 MG/DOSE, 4 MG/3ML SOPN Inject 1 mg as directed once a week.   sulfamethoxazole-trimethoprim (BACTRIM) 400-80 MG tablet Take 1 tablet by mouth every Monday, Wednesday, and Friday.   Vitamin D, Ergocalciferol, (DRISDOL) 1.25 MG (50000 UNIT) CAPS capsule TAKE ONE CAPSULE BY MOUTH ONCE WEEKLY ON SUNDAY   No facility-administered encounter medications on file as of 01/05/2022.    Care Gaps: Shingrix Vaccine Urine Microalbumin Ophthalmology Exam   Star Rating Drug: Ozempic 0.25-0.5 mg last filled on 08/29/2021 for a 84-day supply with [facility].  Jardiance 25 mg last filled on 09/16/2021 for a 90-Day supply with [facility] Metformin 1000 mg last filled on 08/04/2021 for a 90-Day supply with [facility] Glimepiride 4 mg last filled on 08/02/2021 for a 90-Day supply with [facility] Atorvastatin 4 mg last filled on 06/27/2021 for a 90-Day supply with [facility] Pioglitazone 30 mg last filled on 08/02/2021 for a 90-Day supply with [facility]   Medication Fill Gaps: None ID  Recent Relevant Labs: Lab Results  Component Value Date/Time   HGBA1C 7.7 (A) 10/12/2021 11:52 AM   HGBA1C 8.2 (H) 05/16/2021 09:40 AM   HGBA1C 7.3 (A) 01/17/2021 05:11 PM   HGBA1C 7.4 (H) 09/18/2019 11:21 AM   HGBA1C 7.6 (H) 04/04/2013 10:47 AM   MICROALBUR 50 06/06/2017 11:26 AM   MICROALBUR 20 12/08/2015 10:39 AM    Kidney Function Lab Results  Component Value Date/Time   CREATININE 0.59 06/18/2021 04:55 AM   CREATININE 0.50 06/17/2021 06:56 AM   CREATININE 0.73 05/14/2014 09:56 AM   CREATININE 0.68 04/04/2013 10:47 AM    GFRNONAA >60 06/18/2021 04:55 AM   GFRNONAA >60 05/14/2014 09:56 AM   GFRNONAA >60 04/04/2013 10:47 AM   GFRAA 86 09/14/2020 01:03 PM   GFRAA >60 05/14/2014 09:56 AM   GFRAA >60 04/04/2013 10:47 AM    Current antihyperglycemic regimen:  Glimepiride 4 mg twice daily Jardiance 25 mg daily Metformin 1000 mg twice daily Ozempic 1 mg weekly (Thursdays) Pioglitazone 30 mg daily  What recent interventions/DTPs have been made to improve glycemic control:  None ID  Have there been any recent hospitalizations or ED visits since last visit with CPP? No  I have attempted without success to contact this patient by phone three times to do her Diabetes Disease State call. I left a Voice message for patient to return my call.  Adherence Review: Is the patient currently on a STATIN medication? Yes Is the patient currently on ACE/ARB medication? No Does the patient have >5 day gap between last estimated fill dates? No   Face to Face appointment with care management team member scheduled for: 03/27/2022 at 8:30 AM  [facility] Pharmacist Assistant [phone]

## 2022-01-06 DIAGNOSIS — I480 Paroxysmal atrial fibrillation: Secondary | ICD-10-CM | POA: Diagnosis not present

## 2022-01-07 ENCOUNTER — Encounter: Payer: Self-pay | Admitting: Cardiology

## 2022-01-17 ENCOUNTER — Encounter: Payer: Self-pay | Admitting: Podiatry

## 2022-01-20 ENCOUNTER — Telehealth: Payer: Self-pay

## 2022-01-20 NOTE — Telephone Encounter (Signed)
CMN Resubmitted

## 2022-01-23 ENCOUNTER — Telehealth: Payer: Self-pay

## 2022-01-23 NOTE — Telephone Encounter (Signed)
Copied from [place] [id number]. Topic: General - Other >> Jan 23, 2022  9:59 AM Everette C wrote: Reason for CRM: The patient has called to see if the patient has received information from [facility] regarding a prescription for their diabetic shoes   The paperwork was related to verification of the patient's diabetic diagnosis   The patient shares that the paperwork was submitted via fax on Friday 01/20/22  Please contact the patient further when possible

## 2022-01-23 NOTE — Telephone Encounter (Signed)
Have you seen this fax?

## 2022-01-23 NOTE — Telephone Encounter (Addendum)
Fax received. Waiting for provider's signature.

## 2022-01-24 ENCOUNTER — Encounter: Payer: Self-pay | Admitting: Cardiology

## 2022-01-26 DIAGNOSIS — I480 Paroxysmal atrial fibrillation: Secondary | ICD-10-CM | POA: Diagnosis not present

## 2022-01-27 ENCOUNTER — Encounter: Payer: Self-pay | Admitting: Cardiology

## 2022-01-27 DIAGNOSIS — I471 Supraventricular tachycardia, unspecified: Secondary | ICD-10-CM

## 2022-01-27 HISTORY — DX: Supraventricular tachycardia, unspecified: I47.10

## 2022-01-27 HISTORY — DX: Supraventricular tachycardia: I47.1

## 2022-01-30 ENCOUNTER — Encounter: Payer: Self-pay | Admitting: Podiatry

## 2022-02-01 ENCOUNTER — Telehealth: Payer: Self-pay | Admitting: Urology

## 2022-02-01 NOTE — Telephone Encounter (Signed)
DOS - 03/03/22  REMOVAL FIXATION DEEP X'S 2 LEFT --- 20680 GRAFT LEFT --- 20900 ARTHROSCOPY LEFT --- 29899  HTA EFFECTIVE DATE -   RECEIVED FAX FROM [facility] STATING THAT CPT CODES 26333, 20900 AND 54562 HAVE BEEN APPROVED, [place] # [id number], GOOD FROM 03/03/22 - 06/01/22.

## 2022-02-03 ENCOUNTER — Ambulatory Visit (INDEPENDENT_AMBULATORY_CARE_PROVIDER_SITE_OTHER): Payer: HMO | Admitting: *Deleted

## 2022-02-03 DIAGNOSIS — M19172 Post-traumatic osteoarthritis, left ankle and foot: Secondary | ICD-10-CM | POA: Diagnosis not present

## 2022-02-03 DIAGNOSIS — R2689 Other abnormalities of gait and mobility: Secondary | ICD-10-CM

## 2022-02-03 DIAGNOSIS — J849 Interstitial pulmonary disease, unspecified: Secondary | ICD-10-CM | POA: Diagnosis not present

## 2022-02-03 NOTE — Progress Notes (Addendum)
Patient presents to the office stating her boot is coming apart.   On evaluation, it appears she had had the boot awhile. There are multiple areas that are worn.   I dispensed a new boot, short, size medium. Fit was satisfactory.   [employer] form for DME submitted.  Patient will follow up with Dr. Sherryle Lis on 03/03/22 for surgery.

## 2022-02-07 ENCOUNTER — Encounter: Payer: Self-pay | Admitting: Podiatry

## 2022-02-08 NOTE — Telephone Encounter (Signed)
Spoke with patient in person at office when she came in

## 2022-02-09 NOTE — Telephone Encounter (Signed)
Schedule pt  on your next available appt.

## 2022-02-10 ENCOUNTER — Ambulatory Visit: Payer: HMO | Admitting: Podiatry

## 2022-02-10 ENCOUNTER — Telehealth: Payer: Self-pay

## 2022-02-10 NOTE — Telephone Encounter (Signed)
Teresa [name] can you call and get this patient schedule for an appointment with Dr. Sherryle Lis please. Thank you

## 2022-02-15 ENCOUNTER — Ambulatory Visit: Payer: HMO | Admitting: Podiatry

## 2022-02-15 ENCOUNTER — Encounter: Payer: Self-pay | Admitting: Podiatry

## 2022-02-15 ENCOUNTER — Telehealth: Payer: Self-pay | Admitting: *Deleted

## 2022-02-15 ENCOUNTER — Ambulatory Visit (INDEPENDENT_AMBULATORY_CARE_PROVIDER_SITE_OTHER): Payer: HMO | Admitting: Podiatry

## 2022-02-15 DIAGNOSIS — M25572 Pain in left ankle and joints of left foot: Secondary | ICD-10-CM

## 2022-02-15 NOTE — Telephone Encounter (Signed)
[employer] Is calling for clinicals for the medical necessity of the walking boot request. They need by the end of business day today. Please call or fax [phone].

## 2022-02-16 NOTE — Progress Notes (Signed)
Subjective:  Patient ID: Teresa [name], female    DOB: 1945-09-25,  MRN: [id number]  Chief Complaint  Patient presents with   Foot Pain    "It's painful.  I'll be glad when surgery time comes."    76 y.o. female presents with the above complaint. History confirmed with patient.  She states that she fell about 3 weeks ago and twisted her left ankle.  She was seen by her PCP and had an x-ray and a CT scan done.  Its painful to walk or sit and swells up.  Causes sharp throbbing pain.  She works as a Marine scientist.  She is been wearing an ankle brace taking Tylenol and icing the ankle which has helped some.  She had been scheduled for surgery with Dr. Lucia Gaskins, she says that he told her that he would probably do either an ankle fusion or an ankle replacement and really would not know which one he would do until he got in there.  She says she was not able to get the cardiac CT done in time and so the surgery was canceled.  She is here for second opinion today.  She has type 2 diabetes her A1c is 7.7%.  She does not recall having a recent vitamin D level checked.  She previously broke this ankle.   Interval history: She returns today with increasing lateral ankle and foot pain says it is unbearable to wait until surgery, would like an injection today  Objective:  Physical Exam: warm, good capillary refill, no trophic changes or ulcerative lesions, normal DP and PT pulses, and normal sensory exam. Left Foot: Limited range of motion of the ankle that is painful this causes swelling along the anterior joint line.  Pain in sinus tarsi today   CT of the left ankle IMPRESSION: 1. No acute fracture or dislocation of the left ankle. 2. Postsurgical changes of prior ORIF involving the distal left fibula and medial malleolus without evidence of hardware complication. 3. Moderate-severe tibiotalar osteoarthritis.     Electronically Signed   By: Davina Poke D.O.   On: 10/21/2021 11:52  New weightbearing  ankle radiographs today show significant osteoarthrosis of the ankle joint, there is mild valgus deformity noted Assessment:   1. Post-traumatic arthritis of left ankle      Plan:  Patient was evaluated and treated and all questions answered.  She returns today for continued arthritic pain that is becoming increasingly unbearable and requested an injection of the foot.  Most of the pain was centered around the sinus tarsi and lateral ankle.  Following sterile prep with Betadine 2 mg dexamethasone 5 mg of Kenalog and 0.5 cc each of 2% lidocaine and 0.5% Marcaine plain was injected into the sinus tarsi from a lateral approach.  She tolerated this well and was dressed with Band-Aid.  Her surgery scheduled for 2 weeks and her surgical plan remains unchanged.  She has upcoming appoint with her PCP next week and she will let me know what her new A1c is   Surgical plan:  Procedure: -left ankle arthrodesis, bone graft from heel possible bone marrow aspirate from leg  Location: -[facility]  Anesthesia plan: -General anesthesia with preop regional block  Postoperative pain plan: - Tylenol 1000 mg every 6 hours, ibuprofen 600 mg every 6 hours, gabapentin 300 mg every 8 hours x5 days, oxycodone 5 mg 1-2 tabs every 6 hours only as needed  DVT prophylaxis: -She will resume her Eliquis after surgery  WB  Restrictions / DME needs: -NWB in cast after surgery    No follow-ups on file.

## 2022-02-21 ENCOUNTER — Other Ambulatory Visit: Payer: Self-pay

## 2022-02-21 ENCOUNTER — Telehealth: Payer: Self-pay

## 2022-02-21 ENCOUNTER — Encounter
Admission: RE | Admit: 2022-02-21 | Discharge: 2022-02-21 | Disposition: A | Discharge status: Home / Self Care | Payer: PPO | Source: Ambulatory Visit | Attending: Podiatry | Admitting: Podiatry

## 2022-02-21 VITALS — Ht 62.0 in | Wt 188.0 lb

## 2022-02-21 DIAGNOSIS — E1140 Type 2 diabetes mellitus with diabetic neuropathy, unspecified: Secondary | ICD-10-CM

## 2022-02-21 HISTORY — DX: COVID-19: U07.1

## 2022-02-21 HISTORY — DX: Unspecified asthma, uncomplicated: J45.909

## 2022-02-21 NOTE — Patient Instructions (Addendum)
Your procedure is scheduled on: 03/03/22 Report to [facility]. To find out your arrival time please call [phone] between 1PM - 3PM on 03/02/22.  Remember: Instructions that are not followed completely may result in serious medical risk, up to and including death, or upon the discretion of your surgeon and anesthesiologist your surgery may need to be rescheduled.     _X__ 1. Do not eat any food or drink any liquids after midnight the night before your procedure.                 No gum chewing or hard candies.   __X__2.  On the morning of surgery brush your teeth with toothpaste and water, you                 may rinse your mouth with mouthwash if you wish.  Do not swallow any              toothpaste of mouthwash.     _X__ 3.  No Alcohol for 24 hours before or after surgery.   _X__ 4.  Do Not Smoke or use e-cigarettes For 24 Hours Prior to Your Surgery.                 Do not use any chewable tobacco products for at least 6 hours prior to                 surgery.  ____  5.  Bring all medications with you on the day of surgery if instructed.   __X__  6.  Notify your doctor if there is any change in your medical condition      (cold, fever, infections).     Do not wear jewelry, make-up, hairpins, clips or nail polish. Do not wear lotions, powders, or perfumes. You may wear deodorant Do not shave body hair 48 hours prior to surgery. Men may shave face and neck. Do not bring valuables to the hospital.    [facility] is not responsible for any belongings or valuables.  Contacts, dentures/partials or body piercings may not be worn into surgery. Bring a case for your contacts, glasses or hearing aids, a denture cup will be supplied. Leave your suitcase in the car. After surgery it may be brought to your room. For patients admitted to the hospital, discharge time is determined by your treatment team.   Patients discharged the day of  surgery will not be allowed to drive home.   Please read over the following fact sheets that you were given:   MRSA Information, CHG soap, Incentive Spirometer  __X__ Take these medicines the morning of surgery with A SIP OF WATER:    1. gabapentin (NEURONTIN) 800 MG tablet  2. levothyroxine (SYNTHROID) 88 MCG tablet  3.   4.  5.  6.  ____ Fleet Enema (as directed)   __X__ Use CHG Soap/SAGE wipes as directed  __X__ Use inhalers on the day of surgery  __X__ Stop metformin 2 days before surgery (last dose evening of 03/01/22). Stop Jardiance 3 days before surgery (last dose morning of 03/01/22)  ____ Take 1/2 of usual insulin dose the night before surgery. No insulin the morning          of surgery.   __X__ last dose of Eliquis will be evening of 02/28/22  __X__ Stop Anti-inflammatories 7 days before surgery such as Advil, Ibuprofen, Motrin,  BC or Goodies Powder, Naprosyn,  Naproxen, Aleve, Aspirin    __X__ Stop all herbals and supplements, fish oil or vitamins for 7 days until after surgery.    ____ Bring C-Pap to the hospital.    We will wait for labs from visit with Dr Rosanna Randy. Need CBC, BMP and A1C.

## 2022-02-21 NOTE — Telephone Encounter (Signed)
Copied from [place]. Topic: General - Other >> Feb 21, 2022 12:19 PM Leitha Schuller wrote: Teresa [name] w/ pre admission requesting if labs, cbc,bmp, and a1c can be ordered at pts visit on 7-19 for patients upcoming surgery  Please advise a fu w/ caller

## 2022-02-21 NOTE — Telephone Encounter (Signed)
Is this ok?

## 2022-02-22 ENCOUNTER — Encounter: Payer: Self-pay | Admitting: Family Medicine

## 2022-02-22 ENCOUNTER — Encounter: Payer: Self-pay | Admitting: Podiatry

## 2022-02-22 ENCOUNTER — Ambulatory Visit (INDEPENDENT_AMBULATORY_CARE_PROVIDER_SITE_OTHER): Payer: PPO | Admitting: Family Medicine

## 2022-02-22 VITALS — BP 132/77 | HR 50 | Resp 16 | Wt 188.0 lb

## 2022-02-22 DIAGNOSIS — Z01818 Encounter for other preprocedural examination: Secondary | ICD-10-CM

## 2022-02-22 DIAGNOSIS — E7800 Pure hypercholesterolemia, unspecified: Secondary | ICD-10-CM | POA: Diagnosis not present

## 2022-02-22 DIAGNOSIS — E039 Hypothyroidism, unspecified: Secondary | ICD-10-CM | POA: Diagnosis not present

## 2022-02-22 DIAGNOSIS — I5032 Chronic diastolic (congestive) heart failure: Secondary | ICD-10-CM | POA: Diagnosis not present

## 2022-02-22 DIAGNOSIS — I10 Essential (primary) hypertension: Secondary | ICD-10-CM

## 2022-02-22 DIAGNOSIS — I2510 Atherosclerotic heart disease of native coronary artery without angina pectoris: Secondary | ICD-10-CM

## 2022-02-22 DIAGNOSIS — E1140 Type 2 diabetes mellitus with diabetic neuropathy, unspecified: Secondary | ICD-10-CM | POA: Diagnosis not present

## 2022-02-22 NOTE — Progress Notes (Unsigned)
Established patient visit  I,Teresa [name],acting as a scribe for Teresa Mans, MD.,have documented all relevant documentation on the behalf of Teresa Mans, MD,as directed by  Teresa Mans, MD while in the presence of Teresa Mans, MD.   Patient: Teresa [name]   DOB: 01-29-1946   76 y.o. Female  MRN: [id number] Visit Date: 02/22/2022  Today's healthcare provider: Megan Mans, MD   Chief Complaint  Patient presents with   Follow-up   Diabetes   Hypertension   Hyperlipidemia   Subjective    HPI  Diabetes Mellitus Type II, follow-up  Lab Results  Component Value Date   HGBA1C 7.4 (H) 02/22/2022   HGBA1C 7.7 (A) 10/12/2021   HGBA1C 8.2 (H) 05/16/2021   Last seen for diabetes 4 months ago.  Management since then includes; Try Ozempic and follow-up in 3 months.  Home blood sugar records: fasting range: 118 Most Recent Eye Exam: 12/21/2021  --------------------------------------------------------------------------------------------------- Hypertension, follow-up  BP Readings from Last 3 Encounters:  02/22/22 132/77  01/04/22 116/76  11/01/21 125/71   Wt Readings from Last 3 Encounters:  02/22/22 188 lb (85.3 kg)  02/21/22 188 lb (85.3 kg)  01/04/22 188 lb (85.3 kg)     She was last seen for hypertension 4 months ago.  Management since that visit includes; fair control.  Outside blood pressures are 120/72.  --------------------------------------------------------------------------------------------------- Lipid/Cholesterol, follow-up  Last Lipid Panel: Lab Results  Component Value Date   CHOL 146 02/22/2022   LDLCALC 64 02/22/2022   HDL 53 02/22/2022   TRIG 174 (H) 02/22/2022    She was last seen for this 4 months ago.  Management since that visit includes; May need to discuss statin in this patient has been statin intolerant in the past.  LDL good at 95 but would like to see less than 70.  Last metabolic panel Lab  Results  Component Value Date   GLUCOSE 147 (H) 02/22/2022   NA 139 02/22/2022   K 4.4 02/22/2022   BUN 22 02/22/2022   CREATININE 0.71 02/22/2022   EGFR 88 02/22/2022   GFRNONAA >60 06/18/2021   CALCIUM 9.4 02/22/2022   AST 19 02/22/2022   ALT 16 02/22/2022   The ASCVD Risk score (Arnett DK, et al., 2019) failed to calculate for the following reasons:   The patient has a prior MI or stroke diagnosis  ---------------------------------------------------------------------------------------------------   Medications: Outpatient Medications Prior to Visit  Medication Sig   acetaminophen (TYLENOL) 500 MG tablet Take 1,000 mg by mouth every 6 (six) hours as needed for mild pain or headache.   albuterol (VENTOLIN HFA) 108 (90 Base) MCG/ACT inhaler Inhale 2 puffs into the lungs every 6 (six) hours as needed for shortness of breath.   allopurinol (ZYLOPRIM) 100 MG tablet TAKE 1 TABLET BY MOUTH EVERY DAY (Patient taking differently: Take 100 mg by mouth at bedtime.)   apixaban (ELIQUIS) 5 MG TABS tablet Take 5 mg by mouth 2 (two) times daily.   atorvastatin (LIPITOR) 10 MG tablet TAKE 1 TABLET BY MOUTH EVERYDAY AT BEDTIME   colchicine 0.6 MG tablet Take 0.6 mg by mouth daily as needed (gout).   Continuous Blood Gluc Receiver (FREESTYLE LIBRE 2 READER) DEVI 1 each by Does not apply route continuous.   Continuous Blood Gluc Sensor (FREESTYLE LIBRE 2 SENSOR) MISC USE AS DIRECTED   furosemide (LASIX) 40 MG tablet Take 1 tablet (40 mg total) by mouth daily.   gabapentin (NEURONTIN) 800 MG  tablet Take 800 mg by mouth 3 (three) times daily.   glimepiride (AMARYL) 4 MG tablet TAKE 1 TABLET BY MOUTH 2 TIMES DAILY.   JARDIANCE 25 MG TABS tablet TAKE 25 MG BY MOUTH DAILY.   levothyroxine (SYNTHROID) 88 MCG tablet TAKE 1 TABLET BY MOUTH EVERY DAY   metFORMIN (GLUCOPHAGE) 1000 MG tablet TAKE 1 TABLET BY MOUTH TWICE A DAY   nystatin cream (MYCOSTATIN) Apply 1 application topically 2 (two) times daily.  (Patient taking differently: Apply 1 application  topically 2 (two) times daily as needed.)   Omega-3 Fatty Acids (FISH OIL PO) Take 1 capsule by mouth daily.   ondansetron (ZOFRAN) 4 MG tablet TAKE 1 TABLET BY MOUTH EVERY 8 HOURS AS NEEDED FOR NAUSEA AND VOMITING   pioglitazone (ACTOS) 30 MG tablet TAKE 1 TABLET BY MOUTH EVERY DAY   Semaglutide, 1 MG/DOSE, 4 MG/3ML SOPN Inject 1 mg as directed once a week.   Vitamin D, Ergocalciferol, (DRISDOL) 1.25 MG (50000 UNIT) CAPS capsule TAKE ONE CAPSULE BY MOUTH ONCE WEEKLY ON SUNDAY   No facility-administered medications prior to visit.    Review of Systems  Constitutional:  Negative for appetite change, chills, fatigue and fever.  Respiratory:  Negative for chest tightness and shortness of breath.   Cardiovascular:  Negative for chest pain and palpitations.  Gastrointestinal:  Negative for abdominal pain, nausea and vomiting.  Neurological:  Negative for dizziness and weakness.    {Labs  Heme  Chem  Endocrine  Serology  Results Review (optional):23779}   Objective    BP 132/77 (BP Location: Right Arm, Patient Position: Sitting, Cuff Size: Normal)   Pulse (!) 50   Resp 16   Wt 188 lb (85.3 kg)   SpO2 97%   BMI 34.39 kg/m  {Show previous vital signs (optional):23777}  Physical Exam  ***  Results for orders placed or performed in visit on 02/22/22  Lipid panel  Result Value Ref Range   Cholesterol, Total 146 100 - 199 mg/dL   Triglycerides 161 (H) 0 - 149 mg/dL   HDL 53 >09 mg/dL   VLDL Cholesterol Cal 29 5 - 40 mg/dL   LDL Chol Calc (NIH) 64 0 - 99 mg/dL   Chol/HDL Ratio 2.8 0.0 - 4.4 ratio  Hemoglobin A1c  Result Value Ref Range   Hgb A1c MFr Bld 7.4 (H) 4.8 - 5.6 %   Est. average glucose Bld gHb Est-mCnc 166 mg/dL  CBC w/Diff/Platelet  Result Value Ref Range   WBC 7.9 3.4 - 10.8 x10E3/uL   RBC 4.60 3.77 - 5.28 x10E6/uL   Hemoglobin 13.8 11.1 - 15.9 g/dL   Hematocrit 60.4 54.0 - 46.6 %   MCV 91 79 - 97 fL   MCH 30.0  26.6 - 33.0 pg   MCHC 33.1 31.5 - 35.7 g/dL   RDW 98.1 19.1 - 47.8 %   Platelets 212 150 - 450 x10E3/uL   Neutrophils 43 Not Estab. %   Lymphs 44 Not Estab. %   Monocytes 7 Not Estab. %   Eos 4 Not Estab. %   Basos 1 Not Estab. %   Neutrophils Absolute 3.6 1.4 - 7.0 x10E3/uL   Lymphocytes Absolute 3.5 (H) 0.7 - 3.1 x10E3/uL   Monocytes Absolute 0.6 0.1 - 0.9 x10E3/uL   EOS (ABSOLUTE) 0.3 0.0 - 0.4 x10E3/uL   Basophils Absolute 0.1 0.0 - 0.2 x10E3/uL   Immature Granulocytes 1 Not Estab. %   Immature Grans (Abs) 0.0 0.0 - 0.1 x10E3/uL  Comprehensive  Metabolic Panel (CMET)  Result Value Ref Range   Glucose 147 (H) 70 - 99 mg/dL   BUN 22 8 - 27 mg/dL   Creatinine, Ser 1.61 0.57 - 1.00 mg/dL   eGFR 88 >09 UE/AVW/0.98   BUN/Creatinine Ratio 31 (H) 12 - 28   Sodium 139 134 - 144 mmol/L   Potassium 4.4 3.5 - 5.2 mmol/L   Chloride 101 96 - 106 mmol/L   CO2 23 20 - 29 mmol/L   Calcium 9.4 8.7 - 10.3 mg/dL   Total Protein 6.8 6.0 - 8.5 g/dL   Albumin 4.3 3.8 - 4.8 g/dL   Globulin, Total 2.5 1.5 - 4.5 g/dL   Albumin/Globulin Ratio 1.7 1.2 - 2.2   Bilirubin Total 0.3 0.0 - 1.2 mg/dL   Alkaline Phosphatase 105 44 - 121 IU/L   AST 19 0 - 40 IU/L   ALT 16 0 - 32 IU/L  TSH  Result Value Ref Range   TSH 2.020 0.450 - 4.500 uIU/mL    Assessment & Plan     1. Type 2 diabetes mellitus with diabetic neuropathy, without long-term current use of insulin (HCC)  - Hemoglobin A1c - CBC w/Diff/Platelet - Comprehensive Metabolic Panel (CMET)  2. Essential hypertension  - CBC w/Diff/Platelet - Comprehensive Metabolic Panel (CMET)  3. Pure hypercholesterolemia  - Lipid panel - CBC w/Diff/Platelet - Comprehensive Metabolic Panel (CMET)  4. Chronic diastolic CHF (congestive heart failure) (HCC)  - CBC w/Diff/Platelet - Comprehensive Metabolic Panel (CMET)  5. Acquired hypothyroidism  - CBC w/Diff/Platelet - Comprehensive Metabolic Panel (CMET) - TSH  6. Coronary artery disease  involving native coronary artery of native heart without angina pectoris  - CBC w/Diff/Platelet - Comprehensive Metabolic Panel (CMET)  7. Preop examination Patient is cleared for surgery.  - Lipid panel - Hemoglobin A1c - CBC w/Diff/Platelet - Comprehensive Metabolic Panel (CMET) - TSH   Return in about 4 months (around 06/25/2022).      {provider attestation***:1}   Teresa Mans, MD  [facility] [phone] (phone) [phone] (fax)  [facility]

## 2022-02-22 NOTE — H&P (View-Only) (Signed)
Established patient visit  I,[name],acting as a scribe for Wilhemena Durie, MD.,have documented all relevant documentation on the behalf of Wilhemena Durie, MD,as directed by  Wilhemena Durie, MD while in the presence of Wilhemena Durie, MD.   Patient: Teresa [name]   DOB: Dec 31, 1945   76 y.o. Female  MRN: [id number] Visit Date: 02/22/2022  Today's healthcare provider: Wilhemena Durie, MD   Chief Complaint  Patient presents with   Follow-up   Diabetes   Hypertension   Hyperlipidemia   Subjective    HPI  Comes in today for follow-up and preop for her ankle surgery. He has no chest pain or anginal symptoms.  No neurologic symptoms.  Diabetes Mellitus Type II, follow-up  Lab Results  Component Value Date   HGBA1C 7.4 (H) 02/22/2022   HGBA1C 7.7 (A) 10/12/2021   HGBA1C 8.2 (H) 05/16/2021   Last seen for diabetes 4 months ago.  Management since then includes; Try Ozempic and follow-up in 3 months.  Home blood sugar records: fasting range: 118 Most Recent Eye Exam: 12/21/2021  --------------------------------------------------------------------------------------------------- Hypertension, follow-up  BP Readings from Last 3 Encounters:  02/22/22 132/77  01/04/22 116/76  11/01/21 125/71   Wt Readings from Last 3 Encounters:  02/22/22 188 lb (85.3 kg)  02/21/22 188 lb (85.3 kg)  01/04/22 188 lb (85.3 kg)     She was last seen for hypertension 4 months ago.  Management since that visit includes; fair control.  Outside blood pressures are 120/72.  --------------------------------------------------------------------------------------------------- Lipid/Cholesterol, follow-up  Last Lipid Panel: Lab Results  Component Value Date   CHOL 146 02/22/2022   LDLCALC 64 02/22/2022   HDL 53 02/22/2022   TRIG 174 (H) 02/22/2022    She was last seen for this 4 months ago.  Management since that visit includes; May need to discuss statin in this  patient has been statin intolerant in the past.  LDL good at 95 but would like to see less than 70.  Last metabolic panel Lab Results  Component Value Date   GLUCOSE 147 (H) 02/22/2022   NA 139 02/22/2022   K 4.4 02/22/2022   BUN 22 02/22/2022   CREATININE 0.71 02/22/2022   EGFR 88 02/22/2022   GFRNONAA >60 06/18/2021   CALCIUM 9.4 02/22/2022   AST 19 02/22/2022   ALT 16 02/22/2022   The ASCVD Risk score (Arnett DK, et al., 2019) failed to calculate for the following reasons:   The patient has a prior MI or stroke diagnosis  ---------------------------------------------------------------------------------------------------   Medications: Outpatient Medications Prior to Visit  Medication Sig   acetaminophen (TYLENOL) 500 MG tablet Take 1,000 mg by mouth every 6 (six) hours as needed for mild pain or headache.   albuterol (VENTOLIN HFA) 108 (90 Base) MCG/ACT inhaler Inhale 2 puffs into the lungs every 6 (six) hours as needed for shortness of breath.   allopurinol (ZYLOPRIM) 100 MG tablet TAKE 1 TABLET BY MOUTH EVERY DAY (Patient taking differently: Take 100 mg by mouth at bedtime.)   apixaban (ELIQUIS) 5 MG TABS tablet Take 5 mg by mouth 2 (two) times daily.   atorvastatin (LIPITOR) 10 MG tablet TAKE 1 TABLET BY MOUTH EVERYDAY AT BEDTIME   colchicine 0.6 MG tablet Take 0.6 mg by mouth daily as needed (gout).   Continuous Blood Gluc Receiver (FREESTYLE LIBRE 2 READER) DEVI 1 each by Does not apply route continuous.   Continuous Blood Gluc Sensor (FREESTYLE LIBRE 2 SENSOR) MISC USE  AS DIRECTED   furosemide (LASIX) 40 MG tablet Take 1 tablet (40 mg total) by mouth daily.   gabapentin (NEURONTIN) 800 MG tablet Take 800 mg by mouth 3 (three) times daily.   glimepiride (AMARYL) 4 MG tablet TAKE 1 TABLET BY MOUTH 2 TIMES DAILY.   JARDIANCE 25 MG TABS tablet TAKE 25 MG BY MOUTH DAILY.   levothyroxine (SYNTHROID) 88 MCG tablet TAKE 1 TABLET BY MOUTH EVERY DAY   metFORMIN (GLUCOPHAGE) 1000  MG tablet TAKE 1 TABLET BY MOUTH TWICE A DAY   nystatin cream (MYCOSTATIN) Apply 1 application topically 2 (two) times daily. (Patient taking differently: Apply 1 application  topically 2 (two) times daily as needed.)   Omega-3 Fatty Acids (FISH OIL PO) Take 1 capsule by mouth daily.   ondansetron (ZOFRAN) 4 MG tablet TAKE 1 TABLET BY MOUTH EVERY 8 HOURS AS NEEDED FOR NAUSEA AND VOMITING   pioglitazone (ACTOS) 30 MG tablet TAKE 1 TABLET BY MOUTH EVERY DAY   Semaglutide, 1 MG/DOSE, 4 MG/3ML SOPN Inject 1 mg as directed once a week.   Vitamin D, Ergocalciferol, (DRISDOL) 1.25 MG (50000 UNIT) CAPS capsule TAKE ONE CAPSULE BY MOUTH ONCE WEEKLY ON SUNDAY   No facility-administered medications prior to visit.    Review of Systems  Constitutional:  Negative for appetite change, chills, fatigue and fever.  Respiratory:  Negative for chest tightness and shortness of breath.   Cardiovascular:  Negative for chest pain and palpitations.  Gastrointestinal:  Negative for abdominal pain, nausea and vomiting.  Neurological:  Negative for dizziness and weakness.    Last hemoglobin A1c Lab Results  Component Value Date   HGBA1C 7.4 (H) 02/22/2022       Objective    BP 132/77 (BP Location: Right Arm, Patient Position: Sitting, Cuff Size: Normal)   Pulse (!) 50   Resp 16   Wt 188 lb (85.3 kg)   SpO2 97%   BMI 34.39 kg/m  BP Readings from Last 3 Encounters:  02/22/22 132/77  01/04/22 116/76  11/01/21 125/71   Wt Readings from Last 3 Encounters:  02/22/22 188 lb (85.3 kg)  02/21/22 188 lb (85.3 kg)  01/04/22 188 lb (85.3 kg)      Physical Exam Vitals reviewed.  Constitutional:      General: She is not in acute distress.    Appearance: She is well-developed.  HENT:     Head: Normocephalic and atraumatic.     Right Ear: Hearing normal.     Left Ear: Hearing normal.     Nose: Nose normal.  Eyes:     General: Lids are normal. No scleral icterus.       Right eye: No discharge.         Left eye: No discharge.     Conjunctiva/sclera: Conjunctivae normal.  Cardiovascular:     Rate and Rhythm: Normal rate and regular rhythm.     Heart sounds: Normal heart sounds.  Pulmonary:     Effort: Pulmonary effort is normal. No respiratory distress.  Skin:    Findings: No lesion or rash.  Neurological:     General: No focal deficit present.     Mental Status: She is alert and oriented to person, place, and time.  Psychiatric:        Mood and Affect: Mood normal.        Speech: Speech normal.        Behavior: Behavior normal.        Thought Content: Thought content  normal.        Judgment: Judgment normal.       Results for orders placed or performed in visit on 02/22/22  Lipid panel  Result Value Ref Range   Cholesterol, Total 146 100 - 199 mg/dL   Triglycerides 174 (H) 0 - 149 mg/dL   HDL 53 >39 mg/dL   VLDL Cholesterol Cal 29 5 - 40 mg/dL   LDL Chol Calc (NIH) 64 0 - 99 mg/dL   Chol/HDL Ratio 2.8 0.0 - 4.4 ratio  Hemoglobin A1c  Result Value Ref Range   Hgb A1c MFr Bld 7.4 (H) 4.8 - 5.6 %   Est. average glucose Bld gHb Est-mCnc 166 mg/dL  CBC w/Diff/Platelet  Result Value Ref Range   WBC 7.9 3.4 - 10.8 x10E3/uL   RBC 4.60 3.77 - 5.28 x10E6/uL   Hemoglobin 13.8 11.1 - 15.9 g/dL   Hematocrit 41.7 34.0 - 46.6 %   MCV 91 79 - 97 fL   MCH 30.0 26.6 - 33.0 pg   MCHC 33.1 31.5 - 35.7 g/dL   RDW 14.4 11.7 - 15.4 %   Platelets 212 150 - 450 x10E3/uL   Neutrophils 43 Not Estab. %   Lymphs 44 Not Estab. %   Monocytes 7 Not Estab. %   Eos 4 Not Estab. %   Basos 1 Not Estab. %   Neutrophils Absolute 3.6 1.4 - 7.0 x10E3/uL   Lymphocytes Absolute 3.5 (H) 0.7 - 3.1 x10E3/uL   Monocytes Absolute 0.6 0.1 - 0.9 x10E3/uL   EOS (ABSOLUTE) 0.3 0.0 - 0.4 x10E3/uL   Basophils Absolute 0.1 0.0 - 0.2 x10E3/uL   Immature Granulocytes 1 Not Estab. %   Immature Grans (Abs) 0.0 0.0 - 0.1 x10E3/uL  Comprehensive Metabolic Panel (CMET)  Result Value Ref Range   Glucose 147 (H) 70 -  99 mg/dL   BUN 22 8 - 27 mg/dL   Creatinine, Ser 0.71 0.57 - 1.00 mg/dL   eGFR 88 >59 mL/min/1.73   BUN/Creatinine Ratio 31 (H) 12 - 28   Sodium 139 134 - 144 mmol/L   Potassium 4.4 3.5 - 5.2 mmol/L   Chloride 101 96 - 106 mmol/L   CO2 23 20 - 29 mmol/L   Calcium 9.4 8.7 - 10.3 mg/dL   Total Protein 6.8 6.0 - 8.5 g/dL   Albumin 4.3 3.8 - 4.8 g/dL   Globulin, Total 2.5 1.5 - 4.5 g/dL   Albumin/Globulin Ratio 1.7 1.2 - 2.2   Bilirubin Total 0.3 0.0 - 1.2 mg/dL   Alkaline Phosphatase 105 44 - 121 IU/L   AST 19 0 - 40 IU/L   ALT 16 0 - 32 IU/L  TSH  Result Value Ref Range   TSH 2.020 0.450 - 4.500 uIU/mL    Assessment & Plan     1. Type 2 diabetes mellitus with diabetic neuropathy, without long-term current use of insulin (HCC) Goal A1c less than 7.5. - Hemoglobin A1c - CBC w/Diff/Platelet - Comprehensive Metabolic Panel (CMET)  2. Essential hypertension  - CBC w/Diff/Platelet - Comprehensive Metabolic Panel (CMET)  3. Pure hypercholesterolemia  - Lipid panel - CBC w/Diff/Platelet - Comprehensive Metabolic Panel (CMET)  4. Chronic diastolic CHF (congestive heart failure) (HCC)  - CBC w/Diff/Platelet - Comprehensive Metabolic Panel (CMET)  5. Acquired hypothyroidism  - CBC w/Diff/Platelet - Comprehensive Metabolic Panel (CMET) - TSH  6. Coronary artery disease involving native coronary artery of native heart without angina pectoris  - CBC w/Diff/Platelet - Comprehensive Metabolic Panel (CMET)  7. Preop examination Patient is cleared for surgery.  - Lipid panel - Hemoglobin A1c - CBC w/Diff/Platelet - Comprehensive Metabolic Panel (CMET) - TSH   Return in about 4 months (around 06/25/2022).      I, Wilhemena Durie, MD, have reviewed all documentation for this visit. The documentation on 02/25/22 for the exam, diagnosis, procedures, and orders are all accurate and complete.     Cranford Mon, MD  [facility] [phone]  (phone) [phone] (fax)  [place]

## 2022-02-23 LAB — LIPID PANEL
Chol/HDL Ratio: 2.8 ratio (ref 0.0–4.4)
Cholesterol, Total: 146 mg/dL (ref 100–199)
HDL: 53 mg/dL (ref >39)
LDL Chol Calc (NIH): 64 mg/dL (ref 0–99)
Triglycerides: 174 mg/dL — ABNORMAL HIGH (ref 0–149)
VLDL Cholesterol Cal: 29 mg/dL (ref 5–40)

## 2022-02-23 LAB — CBC WITH DIFFERENTIAL/PLATELET
Basophils Absolute: 0.1 10*3/uL (ref 0.0–0.2)
Basos: 1 %
EOS (ABSOLUTE): 0.3 10*3/uL (ref 0.0–0.4)
Eos: 4 %
Hematocrit: 41.7 % (ref 34.0–46.6)
Hemoglobin: 13.8 g/dL (ref 11.1–15.9)
Immature Grans (Abs): 0 10*3/uL (ref 0.0–0.1)
Immature Granulocytes: 1 %
Lymphocytes Absolute: 3.5 10*3/uL — ABNORMAL HIGH (ref 0.7–3.1)
Lymphs: 44 %
MCH: 30 pg (ref 26.6–33.0)
MCHC: 33.1 g/dL (ref 31.5–35.7)
MCV: 91 fL (ref 79–97)
Monocytes Absolute: 0.6 10*3/uL (ref 0.1–0.9)
Monocytes: 7 %
Neutrophils Absolute: 3.6 10*3/uL (ref 1.4–7.0)
Neutrophils: 43 %
Platelets: 212 10*3/uL (ref 150–450)
RBC: 4.6 x10E6/uL (ref 3.77–5.28)
RDW: 14.4 % (ref 11.7–15.4)
WBC: 7.9 10*3/uL (ref 3.4–10.8)

## 2022-02-23 LAB — COMPREHENSIVE METABOLIC PANEL
ALT: 16 IU/L (ref 0–32)
AST: 19 IU/L (ref 0–40)
Albumin/Globulin Ratio: 1.7 (ref 1.2–2.2)
Albumin: 4.3 g/dL (ref 3.8–4.8)
Alkaline Phosphatase: 105 IU/L (ref 44–121)
BUN/Creatinine Ratio: 31 — ABNORMAL HIGH (ref 12–28)
BUN: 22 mg/dL (ref 8–27)
Bilirubin Total: 0.3 mg/dL (ref 0.0–1.2)
CO2: 23 mmol/L (ref 20–29)
Calcium: 9.4 mg/dL (ref 8.7–10.3)
Chloride: 101 mmol/L (ref 96–106)
Creatinine, Ser: 0.71 mg/dL (ref 0.57–1.00)
Globulin, Total: 2.5 g/dL (ref 1.5–4.5)
Glucose: 147 mg/dL — ABNORMAL HIGH (ref 70–99)
Potassium: 4.4 mmol/L (ref 3.5–5.2)
Sodium: 139 mmol/L (ref 134–144)
Total Protein: 6.8 g/dL (ref 6.0–8.5)
eGFR: 88 mL/min/{1.73_m2} (ref >59)

## 2022-02-23 LAB — HEMOGLOBIN A1C
Est. average glucose Bld gHb Est-mCnc: 166 mg/dL
Hgb A1c MFr Bld: 7.4 % — ABNORMAL HIGH (ref 4.8–5.6)

## 2022-02-23 LAB — TSH: TSH: 2.02 u[IU]/mL (ref 0.450–4.500)

## 2022-02-24 ENCOUNTER — Telehealth: Payer: Self-pay

## 2022-02-24 NOTE — Telephone Encounter (Signed)
Medical clearance form was filled out and sign at her appointment.

## 2022-02-24 NOTE — Telephone Encounter (Signed)
I do not see a CPE for this patient in the past year.  She had an AWV with [facility] in early 2022; otherwise everything has been a 4 mth f/u.  The patient was just here to see Rosanna Randy on 7/19 but didn't see where a CPE or surgical clearance was done.  Does the patient need to come back in for clearance?

## 2022-02-24 NOTE — Telephone Encounter (Signed)
Copied from [place] [id number]. Topic: Medical Record Request - Provider/Facility Request >> Feb 24, 2022 10:11 AM Sabas Sous wrote: Pt called to report that her [facility] needs a fax of recent CPE records. She says he needs this as soon as possible, she is scheduled next Friday morning she says.

## 2022-02-27 ENCOUNTER — Encounter: Payer: Self-pay | Admitting: Podiatry

## 2022-02-27 ENCOUNTER — Other Ambulatory Visit: Payer: Self-pay | Admitting: Podiatry

## 2022-02-27 NOTE — Telephone Encounter (Signed)
Please advise 

## 2022-02-28 ENCOUNTER — Encounter: Payer: Self-pay | Admitting: Podiatry

## 2022-02-28 ENCOUNTER — Telehealth: Payer: Self-pay

## 2022-02-28 DIAGNOSIS — J449 Chronic obstructive pulmonary disease, unspecified: Secondary | ICD-10-CM | POA: Diagnosis not present

## 2022-02-28 DIAGNOSIS — E1142 Type 2 diabetes mellitus with diabetic polyneuropathy: Secondary | ICD-10-CM | POA: Diagnosis not present

## 2022-02-28 DIAGNOSIS — E118 Type 2 diabetes mellitus with unspecified complications: Secondary | ICD-10-CM

## 2022-02-28 DIAGNOSIS — R234 Changes in skin texture: Secondary | ICD-10-CM | POA: Diagnosis not present

## 2022-02-28 MED ORDER — PIOGLITAZONE HCL 30 MG PO TABS: 30.0000 mg | ORAL_TABLET | Freq: Every day | ORAL | 3 refills | Status: DC

## 2022-02-28 MED ORDER — GLIMEPIRIDE 4 MG PO TABS: 4.0000 mg | ORAL_TABLET | Freq: Two times a day (BID) | ORAL | 3 refills | Status: DC

## 2022-02-28 NOTE — Telephone Encounter (Signed)
Faxed

## 2022-02-28 NOTE — Progress Notes (Signed)
  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 Lanae Crumbly, DPM. In review of her medication reconciliation it was noted that patient is on a prescribed GLP-1 medication (semaglutide). Per guidelines issued by the American Society of Anesthesiologists (ASA), it is recommended that these medications be held for 7 days prior to the patient undergoing any type of elective surgical procedure.   Reached out to prescribing provider Rosanna Randy, MD) to make them aware of the guidelines from anesthesia. Given that this patient takes the prescribe GLP-1 medication for her  diabetes diagnosis rather than for weight loss, recommendations from the prescribing provider were solicited. Prescribing provider made aware of the following so that informed decision/POC can be developed for this patient that may be taking medications belonging to these drug classes:  Oral GLP-1 medications will be held 1 day prior to surgery.  Injectable GLP-1 medications will be held 7 days prior to surgery.  Metformin is routinely held 48 hours prior to surgery due to renal concerns, potential need for contrasted imaging perioperatively, and the potential for tissue hypoxia leading to drug induced lactic acidosis.  All SGLT2i medications are held 72 hours prior to surgery as they can be associated with the increased potential for  developing euglycemic diabetic ketoacidosis (EDKA).   Impression and Plan:  Teresa [name] is on a prescribed GLP-1 medication, which induces the known side effect of decreased gastric emptying. Efforts are bring made to mitigate the risk of perioperative hyperglycemic events, as elevated blood glucose levels have been found to contribute to intra/postoperative complications. Additionally, hyperglycemic extremes can potentially necessitate the postponing of a patient's elective case in order to better optimize perioperative glycemic control, again with the aforementioned guidelines in place. With this in mind, recommendations have been sought from the prescribing provider, who has cleared patient to proceed with holding the prescribed GLP-1 as per the guidelines from the ASA.   Provider recommending: no further recommendations received from prescribing provider.  Copy of signed clearance and recommendations placed on patient's chart for inclusion in their medical record and for review by the surgical/anesthetic team on the day of her procedure.   Honor Loh, MSN, APRN, FNP-C, CEN [facility]  Peri-operative Services Nurse Practitioner Phone: [phone] 02/28/22 12:08 PM  NOTE: This note has been prepared using Dragon dictation software. Despite my best ability to proofread, there is always the potential that unintentional transcriptional errors may still occur from this process.

## 2022-02-28 NOTE — Progress Notes (Signed)
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 Clinical nursing staff has updated patient's PMH/PSHx, current medication list, and drug allergies/intolerances to ensure comprehensive history available to assist in medical decision making as it pertains to the aforementioned surgical procedure and anticipated anesthetic course. Extensive review of available clinical information performed. [place] PMH and PSHx updated with any diagnoses/procedures that  may have been inadvertently omitted during her intake with the pre-admission testing department's nursing staff.  Clinical Discussion:  Teresa [name] is a 76 y.o. female who is submitted for pre-surgical anesthesia review and clearance prior to her undergoing the above procedure. Patient has never been a smoker. Pertinent PMH includes: CAD, atrial fibrillation, CHF, PSVT, Mobitz type I AV block, palpitations, RBBB, aortic atherosclerosis, pulmonary hypertension, angina, HTN, HLD, T2DM, hypothyroidism, COPD, asthma, DOE, OSAH (does not require nocturnal PAP therapy), GERD (no daily Tx), hiatal hernia, peripheral edema, scleroderma, OA.  Patient is followed by cardiology Juliann Pares, MD). She was last seen in the cardiology clinic on 12/19/2021; notes reviewed.  At the time of her clinic visit, patient reported to be doing "reasonably well" from a  cardiovascular perspective.  She denied any episodes of chest pain, shortness breath, PND, orthopnea, palpitations, significant peripheral edema, vertiginous symptoms, or presyncope/syncope.  Patient with a past medical history significant for cardiovascular diagnoses.  Last TTE was performed on 06/14/2021 revealing a normal left ventricular systolic function with an EF of 60 to 65%.  There were no regional wall motion abnormalities.  There was trivial mitral valve regurgitation.  There was no evidence of a significant transvalvular gradient to suggest stenosis.  Coronary CTA was performed on 11/01/2021 revealing a coronary calcium score of 174, which was the 67 percentile for age, sex, and race matched controls.  Study revealed mild stenosis in the ostial LAD, mid LCx, proximal OM1, and ostial RCA.  Moderate soft plaque stenosis noted in the mid LAD causing up to 50-69% stenosis.  FFR analysis performed demonstrating no hemodynamically significant flow-limiting lesions.  Left main: 0.99 LAD: proximal = 0.98, mid 0.92, distal 0.84 LCx: proximal = 0.99, mid = 0.99, distal = 0.96 RCA: proximal = 0.95 mid = 0.92, distal = 0.90  Long-term cardiac event monitor study performed on 01/27/2022 revealing a predominant underlying sinus rhythm with an average rate of 66 bpm; range 32-121 bpm.  There were 6 nonsustained SVT episodes with the longest lasting 17 beats an average rate of 109 bpm.  Wenkebach present.  Occasional supraventricular ectopy (2.9%) and rare ventricular ectopy noted.  There were no sustained arrhythmias.  No evidence of atrial fibrillation.  Patient with an atrial fibrillation diagnosis; CHA2DS2-VASc Score = 7 (age x 2, sex, CHF, HTN, vascular disease history, T2DM). Her rate and rhythm are currently being maintained without the use of pharmacological intervention.  She is chronically anticoagulated on standard dose apixaban; compliant with therapy  with no evidence or reports of GI bleeding.   Blood pressure well controlled at 122/60 on currently prescribed diuretic monotherapy.  Patient is on a statin + omega-3 fatty acid for her HLD diagnosis and further ASCVD prevention.  T2DM reasonably controlled on currently prescribed regimen; last HgbA1c was 7.4% when checked on 02/22/2022.  Of note, patient is on an SGLT2i (empagliflozin) for management of her T2DM diagnosis in the setting of known heart failure.  Additionally, patient is on a GLP-1 (semaglutide).  Patient has an OSAH diagnosis, however is noncompliant with prescribed nocturnal PAP therapy. Repeat PSG recommended. Functional capacity limited by age, deconditioning, and multiple medical comorbidities.  Patient questionably able to achieve 4 METS of activity without angina/anginal equivalent symptoms.  No changes were made to her medication regimen.  Patient to follow-up with outpatient cardiology in 4 months or sooner if needed.  Teresa [name] is scheduled for a LEFT ANKLE ARTHROSCOPY; BONE GRAFT; POSSIBLE HARDWARE REMOVAL on 03/03/2022 with Dr. Sharl Ma, DPM. Given patient's past medical history significant for cardiovascular diagnoses, presurgical cardiac clearance was sought by the PAT team. Per cardiology, "this patient is optimized for surgery and may proceed with the planned procedural course with a MODERATE risk of significant perioperative cardiovascular complications".  As previously mentioned, patient is on daily anticoagulation therapy.  She has been instructed on recommendations from her cardiologist for holding her apixaban dose for 3 days prior to her procedure with plans to restart as soon as postoperatively respectively minimized by her primary attending surgeon.  The patient is aware that her last dose of apixaban should be on 02/27/2022.  Patient reports previous perioperative complications with anesthesia in the past. Patient has a PMH (+) for PONV. Symptoms and history of PONV will be discussed with patient by  anesthesia team on the day of her procedure. Interventions will be ordered as deemed necessary based on patient's individual care needs as determined by anesthesiologist.  In review of the available records, it is noted that patient underwent a MAC anesthetic course here at [facility] in 10/2018 without documented complications.      02/22/2022    8:30 AM 02/21/2022   11:18 AM 01/04/2022    8:24 AM  Vitals with BMI  Height  5\' 2"  5\' 2"   Weight 188 lbs 188 lbs 188 lbs  BMI 34.38 34.38 34.38  Systolic 132  116  Diastolic 77  76  Pulse 50  61    Providers/Specialists:   NOTE: Primary physician provider listed below. Patient may have been seen by APP or partner within same practice.   PROVIDER ROLE / SPECIALTY LAST OV  Edwin Cap, DPM Podiatry (Surgeon) 02/15/2022  Maple Hudson., MD Primary Care Provider 02/22/2022  Rudean Hitt, MD Cardiology 12/19/2021  Vida Rigger, MD Pulmonary Medicine 02/03/2022   Allergies:  Clarithromycin, Codeine, Dilaudid  [hydromorphone hcl], Iodine, Iohexol, Onion, Tamiflu  [oseltamivir phosphate], Zolpidem, Bacitracin-neomycin-polymyxin, Benzalkonium chloride, Lidocaine hcl, Morphine, Neomycin-bacitracin zn-polymyx, Tape, and Tapentadol  Current Home Medications:   No current facility-administered medications for this encounter.    acetaminophen (TYLENOL) 500 MG tablet   albuterol (VENTOLIN HFA) 108 (90 Base) MCG/ACT inhaler   allopurinol (ZYLOPRIM) 100 MG tablet   apixaban (ELIQUIS) 5 MG TABS tablet   atorvastatin (LIPITOR) 10 MG tablet   colchicine 0.6 MG tablet   Continuous Blood Gluc Receiver (FREESTYLE LIBRE 2 READER) DEVI   Continuous Blood Gluc Sensor (FREESTYLE LIBRE 2 SENSOR) MISC  furosemide (LASIX) 40 MG tablet   gabapentin (NEURONTIN) 800 MG tablet   glimepiride (AMARYL) 4 MG tablet   JARDIANCE 25 MG TABS tablet   levothyroxine (SYNTHROID) 88 MCG tablet   metFORMIN  (GLUCOPHAGE) 1000 MG tablet   nystatin cream (MYCOSTATIN)   Omega-3 Fatty Acids (FISH OIL PO)   ondansetron (ZOFRAN) 4 MG tablet   pioglitazone (ACTOS) 30 MG tablet   Semaglutide, 1 MG/DOSE, 4 MG/3ML SOPN   Vitamin D, Ergocalciferol, (DRISDOL) 1.25 MG (50000 UNIT) CAPS capsule   History:   Past Medical History:  Diagnosis Date   Anginal pain (HCC)    Aortic atherosclerosis (HCC)    Arthritis    Asthma    Atrial fibrillation (HCC)    a.) CHA2DS2-VASc = 7 (age x2, sex, CHF, HTN, vascular disease history, T2DM);  b.) rate/rhythm maintained without pharmacological intervention; chronically anticoagulated using standard dose apixaban   Bilateral lower extremity edema    CHF (congestive heart failure) (HCC)    Complication of anesthesia    a.) PONV   COPD (chronic obstructive pulmonary disease) (HCC)    Coronary artery disease    a.) cCTA 11/01/2021: Ca+ score 174 (67th percentile for age/sex match) --> FFR demonstrated no hemodynamically significant stenosis   Depression    DOE (dyspnea on exertion)    GERD (gastroesophageal reflux disease)    Gout    Heart murmur    History of 2019 novel coronavirus disease (COVID-19) 05/12/2019   History of hiatal hernia    History of kidney stones    History of orthopnea    HLD (hyperlipidemia)    Hypertension    Hypothyroidism    Long term current use of anticoagulant    a.) apixaban   Migraines    Mobitz type I Wenckebach atrioventricular block    a.) holter study 01/27/2022   Palpitations    PONV (postoperative nausea and vomiting)    PSVT (paroxysmal supraventricular tachycardia) (HCC) 01/27/2022   a.) holter study 01/27/2022 - longest lasting 17 beats at a rate of 109 bpm   Pulmonary HTN (HCC)    Scleroderma (HCC)    hands   Sleep apnea    a.) does not require nocturnal PAP therapy   T2DM (type 2 diabetes mellitus) (HCC)    Thyroid disease    Vertigo    Vitamin D deficiency    Past Surgical History:  Procedure Laterality  Date   ABDOMINAL HYSTERECTOMY     ovaries intact   BLADDER SURGERY     bladder tuck   BREAST BIOPSY Right 03/20/2016   neg x 2 area   CARDIAC CATHETERIZATION     CARPAL TUNNEL RELEASE     CATARACT EXTRACTION W/PHACO Left 09/17/2018   Procedure: CATARACT EXTRACTION PHACO AND INTRAOCULAR LENS PLACEMENT (IOC) LEFT, DIABETIC;  Surgeon: Galen Manila, MD;  Location: [facility] ORS;  Service: Ophthalmology;  Laterality: Left;  [place] 00:34 CDE 4.85 Fluid pack lot # [id number]   CATARACT EXTRACTION W/PHACO Right 10/15/2018   Procedure: CATARACT EXTRACTION PHACO AND INTRAOCULAR LENS PLACEMENT (IOC)-RIGHT;  Surgeon: Galen Manila, MD;  Location: [facility] ORS;  Service: Ophthalmology;  Laterality: Right;  [place] 00:27.6 CDE 3.43 Fluid Pack Lot # [id number]   COLONOSCOPY WITH PROPOFOL N/A 09/27/2018   Procedure: COLONOSCOPY WITH PROPOFOL;  Surgeon: Pasty Spillers, MD;  Location: [facility] ENDOSCOPY;  Service: Endoscopy;  Laterality: N/A;   DILATION AND CURETTAGE OF UTERUS     EYE SURGERY     eyelid   FRACTURE SURGERY  left ankle-plate and screws palced   LITHOTRIPSY     NISSEN FUNDOPLICATION     SHOULDER SURGERY Right    rotator cuff   TONSILLECTOMY     Family History  Problem Relation Age of Onset   Stroke Mother    Hypertension Mother    Heart disease Mother    Arthritis Mother    Heart disease Father    Hypertension Father    Diabetes Sister    Hypertension Sister    Asthma Sister    Hypertension Sister    Diverticulitis Sister    Colon cancer Maternal Grandmother    Breast cancer Maternal Grandmother    Breast cancer Maternal Aunt    Social History   Tobacco Use   Smoking status: Never   Smokeless tobacco: Never  Vaping Use   Vaping Use: Never used  Substance Use Topics   Alcohol use: Never   Drug use: Never    Pertinent Clinical Results:  LABS: Labs reviewed: Acceptable for surgery.  No visits with results within 3 Day(s) from this visit.  Latest known visit with  results is:  Office Visit on 02/22/2022  Component Date Value Ref Range Status   Cholesterol, Total 02/22/2022 146  100 - 199 mg/dL Final   Triglycerides 40/98/1191 174 (H)  0 - 149 mg/dL Final   HDL 47/82/9562 53  >39 mg/dL Final   VLDL Cholesterol Cal 02/22/2022 29  5 - 40 mg/dL Final   LDL Chol Calc (NIH) 02/22/2022 64  0 - 99 mg/dL Final   Chol/HDL Ratio 02/22/2022 2.8  0.0 - 4.4 ratio Final   Comment:                                   T. Chol/HDL Ratio                                             Men  Women                               1/2 Avg.Risk  3.4    3.3                                   Avg.Risk  5.0    4.4                                2X Avg.Risk  9.6    7.1                                3X Avg.Risk 23.4   11.0    Hgb A1c MFr Bld 02/22/2022 7.4 (H)  4.8 - 5.6 % Final   Comment:          Prediabetes: 5.7 - 6.4          Diabetes: >6.4          Glycemic control for adults with diabetes: <7.0    Est. average glucose Bld gHb Est-m* 02/22/2022 166  mg/dL Final   WBC 13/03/6577 7.9  3.4 - 10.8  x10E3/uL Final   RBC 02/22/2022 4.60  3.77 - 5.28 x10E6/uL Final   Hemoglobin 02/22/2022 13.8  11.1 - 15.9 g/dL Final   Hematocrit 40/98/1191 41.7  34.0 - 46.6 % Final   MCV 02/22/2022 91  79 - 97 fL Final   MCH 02/22/2022 30.0  26.6 - 33.0 pg Final   MCHC 02/22/2022 33.1  31.5 - 35.7 g/dL Final   RDW 47/82/9562 14.4  11.7 - 15.4 % Final   Platelets 02/22/2022 212  150 - 450 x10E3/uL Final   Neutrophils 02/22/2022 43  Not Estab. % Final   Lymphs 02/22/2022 44  Not Estab. % Final   Monocytes 02/22/2022 7  Not Estab. % Final   Eos 02/22/2022 4  Not Estab. % Final   Basos 02/22/2022 1  Not Estab. % Final   Neutrophils Absolute 02/22/2022 3.6  1.4 - 7.0 x10E3/uL Final   Lymphocytes Absolute 02/22/2022 3.5 (H)  0.7 - 3.1 x10E3/uL Final   Monocytes Absolute 02/22/2022 0.6  0.1 - 0.9 x10E3/uL Final   EOS (ABSOLUTE) 02/22/2022 0.3  0.0 - 0.4 x10E3/uL Final   Basophils Absolute  02/22/2022 0.1  0.0 - 0.2 x10E3/uL Final   Immature Granulocytes 02/22/2022 1  Not Estab. % Final   Immature Grans (Abs) 02/22/2022 0.0  0.0 - 0.1 x10E3/uL Final   Glucose 02/22/2022 147 (H)  70 - 99 mg/dL Final   BUN 13/03/6577 22  8 - 27 mg/dL Final   Creatinine, Ser 02/22/2022 0.71  0.57 - 1.00 mg/dL Final   eGFR 46/96/2952 88  >59 mL/min/1.73 Final   BUN/Creatinine Ratio 02/22/2022 31 (H)  12 - 28 Final   Sodium 02/22/2022 139  134 - 144 mmol/L Final   Potassium 02/22/2022 4.4  3.5 - 5.2 mmol/L Final   Chloride 02/22/2022 101  96 - 106 mmol/L Final   CO2 02/22/2022 23  20 - 29 mmol/L Final   Calcium 02/22/2022 9.4  8.7 - 10.3 mg/dL Final   Total Protein 84/13/2440 6.8  6.0 - 8.5 g/dL Final   Albumin 06/03/2535 4.3  3.8 - 4.8 g/dL Final                 **Please note reference interval change**   Globulin, Total 02/22/2022 2.5  1.5 - 4.5 g/dL Final   Albumin/Globulin Ratio 02/22/2022 1.7  1.2 - 2.2 Final   Bilirubin Total 02/22/2022 0.3  0.0 - 1.2 mg/dL Final   Alkaline Phosphatase 02/22/2022 105  44 - 121 IU/L Final   AST 02/22/2022 19  0 - 40 IU/L Final   ALT 02/22/2022 16  0 - 32 IU/L Final   TSH 02/22/2022 2.020  0.450 - 4.500 uIU/mL Final    ECG: Date: 12/19/2021 Rate: 57 bpm Rhythm:  Sinus bradycardia with sinus arrhythmia; RBBB Axis (leads I and aVF): Normal Intervals: PR 178 ms. QRS 124 ms. QTc 455 ms. ST segment and T wave changes: Lateral T wave abnormalities  Comparison: Similar to previous tracing obtained on 06/15/2021 NOTE: Tracing obtained at [facility]; unable for review. Above based on cardiologist's interpretation.    IMAGING / PROCEDURES: LONG TERM CARDIAC EVENT MONITOR STUDY performed on 01/27/2022 HR 32 - 121 bpm, average 66 bpm. 6 nonsustained SVT, longest 17 beats at an average rate of 109 bpm. Wenckebach present. Occasional supraventricular ectopy, 2.9% Rare ventricular ectopy. No sustained arrhythmias. No atrial fibrillation. Symptom  triggered events correspond to sinus rhythm +/- PVC.  CT CORONARY MORPH W/CTA COR W/SCORE W/CA W/CM &/OR WO/CM; FRACTIONAL  FLOW RESERVE DATA performed on 11/01/2021 Coronary calcium score of 174. This was 67th percentile for age-sex, and race-matched controls Normal coronary origin with right dominance Mild to moderate atherosclerosis.  CAD RADS 2 Recommend preventive therapy and risk factor modification The study is limited by significant noise artifact FFR flow analysis demonstrates no hemodynamically flow limiting lesions. Left Main: No significant stenosis. LM FFR = 0.99. LAD: No significant stenosis. Proximal FFR = 0.98, Mid FFR = 0.92, Distal FFR =0.84. LCX: No significant stenosis. Proximal FFR = 0.99, Mid FFR = 0.99, Distal FFR = 0.96. RCA: No significant stenosis. Proximal FFR = 0.0.95, Mid FFR = 0.92, Distal FFR = 0.90.  TRANSTHORACIC ECHOCARDIOGRAM performed on 06/14/2021 Left ventricular ejection fraction, by estimation, is 60 to 65%. The left ventricle has normal function. The left ventricle has no regional wall motion abnormalities. Left ventricular diastolic parameters were normal.  Right ventricular systolic function is normal. The right ventricular size is normal.  The mitral valve is normal in structure. Trivial mitral valve regurgitation. No evidence of mitral stenosis.  The aortic valve is normal in structure. Aortic valve regurgitation is not visualized. No aortic stenosis is present.  The inferior vena cava is normal in size with greater than 50% respiratory variability, suggesting right atrial pressure of 3 mmHg.  Impression and Plan:  Teresa [name] has been referred for pre-anesthesia review and clearance prior to her undergoing the planned anesthetic and procedural courses. Available labs, pertinent testing, and imaging results were personally reviewed by me. This patient has been appropriately cleared by cardiology with an overall MODERATE Her risk of significant  perioperative cardiovascular complications.  Based on clinical review performed today (02/28/22), barring any significant acute changes in the patient's overall condition, it is anticipated that she will be able to proceed with the planned surgical intervention. Any acute changes in clinical condition may necessitate her procedure being postponed and/or cancelled. Patient will meet with anesthesia team (MD and/or CRNA) on the day of her procedure for preoperative evaluation/assessment. Questions regarding anesthetic course will be fielded at that time.   Pre-surgical instructions were reviewed with the patient during her PAT appointment and questions were fielded by PAT clinical staff. Patient was advised that if any questions or concerns arise prior to her procedure then she should return a call to PAT and/or her surgeon's office to discuss.  Quentin Mulling, MSN, APRN, FNP-C, CEN [facility]  Peri-operative Services Nurse Practitioner Phone: [phone] Fax: [phone] 02/28/22 10:42 AM  NOTE: This note has been prepared using Dragon dictation software. Despite my best ability to proofread, there is always the potential that unintentional transcriptional errors may still occur from this process.

## 2022-02-28 NOTE — Telephone Encounter (Signed)
Refills needed for glimepiride and pioglitazone.   [name], PharmD, Para [place], CPP  Clinical Pharmacist Practitioner  [facility] [phone]

## 2022-03-02 ENCOUNTER — Other Ambulatory Visit: Payer: Self-pay | Admitting: Family Medicine

## 2022-03-02 DIAGNOSIS — E118 Type 2 diabetes mellitus with unspecified complications: Secondary | ICD-10-CM

## 2022-03-03 ENCOUNTER — Ambulatory Visit: Payer: HMO

## 2022-03-03 ENCOUNTER — Ambulatory Visit: Payer: HMO | Admitting: Urgent Care

## 2022-03-03 ENCOUNTER — Inpatient Hospital Stay: Payer: HMO

## 2022-03-03 ENCOUNTER — Encounter
Admission: AD | Disposition: A | Discharge status: Skilled Nursing Facility | Payer: Self-pay | Source: Home / Self Care | Attending: Internal Medicine

## 2022-03-03 ENCOUNTER — Other Ambulatory Visit: Payer: Self-pay

## 2022-03-03 ENCOUNTER — Encounter: Payer: Self-pay | Admitting: Podiatry

## 2022-03-03 ENCOUNTER — Inpatient Hospital Stay
Admission: AD | Admit: 2022-03-03 | Discharge: 2022-03-07 | DRG: 493 | Disposition: A | Discharge status: Skilled Nursing Facility | Payer: HMO | Attending: Internal Medicine | Admitting: Internal Medicine

## 2022-03-03 DIAGNOSIS — E669 Obesity, unspecified: Secondary | ICD-10-CM | POA: Diagnosis not present

## 2022-03-03 DIAGNOSIS — K5900 Constipation, unspecified: Secondary | ICD-10-CM | POA: Diagnosis not present

## 2022-03-03 DIAGNOSIS — E559 Vitamin D deficiency, unspecified: Secondary | ICD-10-CM | POA: Diagnosis not present

## 2022-03-03 DIAGNOSIS — E1140 Type 2 diabetes mellitus with diabetic neuropathy, unspecified: Secondary | ICD-10-CM | POA: Diagnosis not present

## 2022-03-03 DIAGNOSIS — I110 Hypertensive heart disease with heart failure: Secondary | ICD-10-CM | POA: Diagnosis not present

## 2022-03-03 DIAGNOSIS — M109 Gout, unspecified: Secondary | ICD-10-CM | POA: Diagnosis present

## 2022-03-03 DIAGNOSIS — T8484XA Pain due to internal orthopedic prosthetic devices, implants and grafts, initial encounter: Secondary | ICD-10-CM | POA: Diagnosis not present

## 2022-03-03 DIAGNOSIS — G4730 Sleep apnea, unspecified: Secondary | ICD-10-CM | POA: Diagnosis not present

## 2022-03-03 DIAGNOSIS — E039 Hypothyroidism, unspecified: Secondary | ICD-10-CM | POA: Diagnosis not present

## 2022-03-03 DIAGNOSIS — J449 Chronic obstructive pulmonary disease, unspecified: Secondary | ICD-10-CM | POA: Diagnosis not present

## 2022-03-03 DIAGNOSIS — I480 Paroxysmal atrial fibrillation: Secondary | ICD-10-CM | POA: Diagnosis not present

## 2022-03-03 DIAGNOSIS — S99912A Unspecified injury of left ankle, initial encounter: Secondary | ICD-10-CM | POA: Diagnosis not present

## 2022-03-03 DIAGNOSIS — I10 Essential (primary) hypertension: Secondary | ICD-10-CM | POA: Diagnosis present

## 2022-03-03 DIAGNOSIS — Z833 Family history of diabetes mellitus: Secondary | ICD-10-CM

## 2022-03-03 DIAGNOSIS — Z79899 Other long term (current) drug therapy: Secondary | ICD-10-CM | POA: Diagnosis not present

## 2022-03-03 DIAGNOSIS — Z825 Family history of asthma and other chronic lower respiratory diseases: Secondary | ICD-10-CM

## 2022-03-03 DIAGNOSIS — F419 Anxiety disorder, unspecified: Secondary | ICD-10-CM | POA: Diagnosis present

## 2022-03-03 DIAGNOSIS — F411 Generalized anxiety disorder: Secondary | ICD-10-CM | POA: Diagnosis not present

## 2022-03-03 DIAGNOSIS — I4891 Unspecified atrial fibrillation: Secondary | ICD-10-CM | POA: Diagnosis present

## 2022-03-03 DIAGNOSIS — M19172 Post-traumatic osteoarthritis, left ankle and foot: Secondary | ICD-10-CM

## 2022-03-03 DIAGNOSIS — E7800 Pure hypercholesterolemia, unspecified: Secondary | ICD-10-CM | POA: Diagnosis not present

## 2022-03-03 DIAGNOSIS — I700 Atherosclerosis of aorta: Secondary | ICD-10-CM | POA: Diagnosis present

## 2022-03-03 DIAGNOSIS — M7732 Calcaneal spur, left foot: Secondary | ICD-10-CM | POA: Diagnosis not present

## 2022-03-03 DIAGNOSIS — I2510 Atherosclerotic heart disease of native coronary artery without angina pectoris: Secondary | ICD-10-CM | POA: Diagnosis not present

## 2022-03-03 DIAGNOSIS — Z7901 Long term (current) use of anticoagulants: Secondary | ICD-10-CM | POA: Diagnosis not present

## 2022-03-03 DIAGNOSIS — M1990 Unspecified osteoarthritis, unspecified site: Secondary | ICD-10-CM | POA: Diagnosis present

## 2022-03-03 DIAGNOSIS — M89772 Major osseous defect, left ankle and foot: Secondary | ICD-10-CM | POA: Diagnosis not present

## 2022-03-03 DIAGNOSIS — Z4789 Encounter for other orthopedic aftercare: Secondary | ICD-10-CM | POA: Diagnosis not present

## 2022-03-03 DIAGNOSIS — I5032 Chronic diastolic (congestive) heart failure: Secondary | ICD-10-CM | POA: Diagnosis not present

## 2022-03-03 DIAGNOSIS — Z9842 Cataract extraction status, left eye: Secondary | ICD-10-CM

## 2022-03-03 DIAGNOSIS — Z961 Presence of intraocular lens: Secondary | ICD-10-CM | POA: Diagnosis not present

## 2022-03-03 DIAGNOSIS — M8970 Major osseous defect, unspecified site: Secondary | ICD-10-CM

## 2022-03-03 DIAGNOSIS — Z8616 Personal history of COVID-19: Secondary | ICD-10-CM | POA: Diagnosis not present

## 2022-03-03 DIAGNOSIS — Y831 Surgical operation with implant of artificial internal device as the cause of abnormal reaction of the patient, or of later complication, without mention of misadventure at the time of the procedure: Secondary | ICD-10-CM | POA: Diagnosis present

## 2022-03-03 DIAGNOSIS — Z885 Allergy status to narcotic agent status: Secondary | ICD-10-CM

## 2022-03-03 DIAGNOSIS — Z9841 Cataract extraction status, right eye: Secondary | ICD-10-CM

## 2022-03-03 DIAGNOSIS — Z90711 Acquired absence of uterus with remaining cervical stump: Secondary | ICD-10-CM

## 2022-03-03 DIAGNOSIS — Z79890 Hormone replacement therapy (postmenopausal): Secondary | ICD-10-CM

## 2022-03-03 DIAGNOSIS — Z888 Allergy status to other drugs, medicaments and biological substances status: Secondary | ICD-10-CM

## 2022-03-03 DIAGNOSIS — M659 Synovitis and tenosynovitis, unspecified: Secondary | ICD-10-CM | POA: Diagnosis present

## 2022-03-03 DIAGNOSIS — I119 Hypertensive heart disease without heart failure: Secondary | ICD-10-CM | POA: Diagnosis present

## 2022-03-03 DIAGNOSIS — Z981 Arthrodesis status: Principal | ICD-10-CM

## 2022-03-03 DIAGNOSIS — Z87442 Personal history of urinary calculi: Secondary | ICD-10-CM

## 2022-03-03 DIAGNOSIS — K219 Gastro-esophageal reflux disease without esophagitis: Secondary | ICD-10-CM | POA: Diagnosis not present

## 2022-03-03 DIAGNOSIS — Z7984 Long term (current) use of oral hypoglycemic drugs: Secondary | ICD-10-CM

## 2022-03-03 DIAGNOSIS — E876 Hypokalemia: Secondary | ICD-10-CM | POA: Diagnosis present

## 2022-03-03 DIAGNOSIS — Z881 Allergy status to other antibiotic agents status: Secondary | ICD-10-CM

## 2022-03-03 DIAGNOSIS — Z8249 Family history of ischemic heart disease and other diseases of the circulatory system: Secondary | ICD-10-CM

## 2022-03-03 DIAGNOSIS — Z91048 Other nonmedicinal substance allergy status: Secondary | ICD-10-CM

## 2022-03-03 DIAGNOSIS — Z6834 Body mass index (BMI) 34.0-34.9, adult: Secondary | ICD-10-CM

## 2022-03-03 DIAGNOSIS — Z91018 Allergy to other foods: Secondary | ICD-10-CM

## 2022-03-03 HISTORY — DX: Other forms of dyspnea: R06.09

## 2022-03-03 HISTORY — DX: Type 2 diabetes mellitus without complications: E11.9

## 2022-03-03 HISTORY — DX: Pulmonary hypertension, unspecified: I27.20

## 2022-03-03 HISTORY — DX: Long term (current) use of anticoagulants: Z79.01

## 2022-03-03 HISTORY — DX: Vitamin D deficiency, unspecified: E55.9

## 2022-03-03 HISTORY — PX: HARDWARE REMOVAL: SHX979

## 2022-03-03 HISTORY — DX: Atherosclerosis of aorta: I70.0

## 2022-03-03 HISTORY — PX: GRAFT APPLICATION: SHX6696

## 2022-03-03 HISTORY — DX: Chronic obstructive pulmonary disease, unspecified: J44.9

## 2022-03-03 HISTORY — DX: Angina pectoris, unspecified: I20.9

## 2022-03-03 HISTORY — DX: Hyperlipidemia, unspecified: E78.5

## 2022-03-03 HISTORY — DX: Localized edema: R60.0

## 2022-03-03 HISTORY — DX: Atrioventricular block, second degree: I44.1

## 2022-03-03 HISTORY — DX: Unspecified right bundle-branch block: I45.10

## 2022-03-03 HISTORY — DX: Migraine, unspecified, not intractable, without status migrainosus: G43.909

## 2022-03-03 HISTORY — DX: Unspecified atrial fibrillation: I48.91

## 2022-03-03 HISTORY — DX: Palpitations: R00.2

## 2022-03-03 HISTORY — PX: ANKLE ARTHROSCOPY: SHX545

## 2022-03-03 LAB — CBC WITH DIFFERENTIAL/PLATELET
Abs Immature Granulocytes: 0.03 10*3/uL (ref 0.00–0.07)
Basophils Absolute: 0 10*3/uL (ref 0.0–0.1)
Basophils Relative: 1 %
Eosinophils Absolute: 0 10*3/uL (ref 0.0–0.5)
Eosinophils Relative: 0 %
HCT: 42.3 % (ref 36.0–46.0)
Hemoglobin: 13.8 g/dL (ref 12.0–15.0)
Immature Granulocytes: 0 %
Lymphocytes Relative: 15 %
Lymphs Abs: 1.1 10*3/uL (ref 0.7–4.0)
MCH: 29.7 pg (ref 26.0–34.0)
MCHC: 32.6 g/dL (ref 30.0–36.0)
MCV: 91.2 fL (ref 80.0–100.0)
Monocytes Absolute: 0.1 10*3/uL (ref 0.1–1.0)
Monocytes Relative: 1 %
Neutro Abs: 6.2 10*3/uL (ref 1.7–7.7)
Neutrophils Relative %: 83 %
Platelets: 208 10*3/uL (ref 150–400)
RBC: 4.64 MIL/uL (ref 3.87–5.11)
RDW: 15.1 % (ref 11.5–15.5)
WBC: 7.5 10*3/uL (ref 4.0–10.5)
nRBC: 0 % (ref 0.0–0.2)

## 2022-03-03 LAB — COMPREHENSIVE METABOLIC PANEL
ALT: 19 U/L (ref 0–44)
AST: 21 U/L (ref 15–41)
Albumin: 3.8 g/dL (ref 3.5–5.0)
Alkaline Phosphatase: 83 U/L (ref 38–126)
Anion gap: 10 (ref 5–15)
BUN: 15 mg/dL (ref 8–23)
CO2: 23 mmol/L (ref 22–32)
Calcium: 8.5 mg/dL — ABNORMAL LOW (ref 8.9–10.3)
Chloride: 108 mmol/L (ref 98–111)
Creatinine, Ser: 0.59 mg/dL (ref 0.44–1.00)
GFR, Estimated: >60 mL/min (ref >60)
Glucose, Bld: 184 mg/dL — ABNORMAL HIGH (ref 70–99)
Potassium: 3.3 mmol/L — ABNORMAL LOW (ref 3.5–5.1)
Sodium: 141 mmol/L (ref 135–145)
Total Bilirubin: 0.9 mg/dL (ref 0.3–1.2)
Total Protein: 6.9 g/dL (ref 6.5–8.1)

## 2022-03-03 LAB — GLUCOSE, CAPILLARY
Glucose-Capillary: 126 mg/dL — ABNORMAL HIGH (ref 70–99)
Glucose-Capillary: 176 mg/dL — ABNORMAL HIGH (ref 70–99)
Glucose-Capillary: 235 mg/dL — ABNORMAL HIGH (ref 70–99)
Glucose-Capillary: 177 mg/dL — ABNORMAL HIGH (ref 70–99)

## 2022-03-03 LAB — MAGNESIUM: Magnesium: 1.9 mg/dL (ref 1.7–2.4)

## 2022-03-03 SURGERY — ARTHROSCOPY, ANKLE
Anesthesia: General | Site: Ankle | Laterality: Left

## 2022-03-03 MED ORDER — BUPIVACAINE HCL (PF) 0.5 % IJ SOLN
INTRAMUSCULAR | Status: DC | PRN
  Administered 2022-03-03 (×2): 20 mL via PERINEURAL

## 2022-03-03 MED ORDER — MENTHOL 3 MG MT LOZG
1.0000 | LOZENGE | OROMUCOSAL | Status: DC | PRN
  Administered 2022-03-03: 3 mg via ORAL
  Filled 2022-03-03 (×2): qty 9

## 2022-03-03 MED ORDER — FENTANYL CITRATE (PF) 100 MCG/2ML IJ SOLN
INTRAMUSCULAR | Status: DC | PRN
Start: 2022-03-03 — End: 2022-03-03
  Administered 2022-03-03 (×2): 50 ug via EPIDURAL

## 2022-03-03 MED ORDER — HEPARIN SODIUM (PORCINE) 5000 UNIT/ML IJ SOLN
5000.0000 [IU] | Freq: Three times a day (TID) | INTRAMUSCULAR | Status: DC
Start: 2022-03-03 — End: 2022-03-03

## 2022-03-03 MED ORDER — PROPOFOL 10 MG/ML IV BOLUS
INTRAVENOUS | Status: DC | PRN
  Administered 2022-03-03: 100 mg via INTRAVENOUS

## 2022-03-03 MED ORDER — HEPARIN SODIUM (PORCINE) 5000 UNIT/ML IJ SOLN
5000.0000 [IU] | Freq: Three times a day (TID) | INTRAMUSCULAR | Status: AC
  Administered 2022-03-04 (×2): 5000 [IU] via SUBCUTANEOUS
  Filled 2022-03-03 (×2): qty 1

## 2022-03-03 MED ORDER — SENNOSIDES-DOCUSATE SODIUM 8.6-50 MG PO TABS
1.0000 | ORAL_TABLET | Freq: Every evening | ORAL | Status: DC | PRN
  Administered 2022-03-04: 1 via ORAL
  Filled 2022-03-03: qty 1

## 2022-03-03 MED ORDER — POLYETHYLENE GLYCOL 3350 17 G PO PACK
17.0000 g | PACK | Freq: Two times a day (BID) | ORAL | Status: DC | PRN
  Administered 2022-03-04 (×2): 17 g via ORAL
  Filled 2022-03-03 (×2): qty 1

## 2022-03-03 MED ORDER — ONDANSETRON HCL 4 MG PO TABS: 4.0000 mg | ORAL_TABLET | Freq: Three times a day (TID) | ORAL | Status: DC | PRN

## 2022-03-03 MED ORDER — LIDOCAINE HCL (PF) 1 % IJ SOLN
INTRAMUSCULAR | Status: AC
  Filled 2022-03-03: qty 2

## 2022-03-03 MED ORDER — ACETAMINOPHEN 10 MG/ML IV SOLN
INTRAVENOUS | Status: DC | PRN
  Administered 2022-03-03: 1000 mg via INTRAVENOUS

## 2022-03-03 MED ORDER — FAMOTIDINE 20 MG PO TABS
ORAL_TABLET | ORAL | Status: AC
  Administered 2022-03-03: 20 mg via ORAL
  Filled 2022-03-03: qty 1

## 2022-03-03 MED ORDER — OXYCODONE HCL 5 MG PO TABS
10.0000 mg | ORAL_TABLET | ORAL | Status: DC | PRN
  Administered 2022-03-04 – 2022-03-07 (×9): 10 mg via ORAL
  Filled 2022-03-03 (×9): qty 2

## 2022-03-03 MED ORDER — FENTANYL CITRATE (PF) 100 MCG/2ML IJ SOLN
INTRAMUSCULAR | Status: DC | PRN
  Administered 2022-03-03: 50 ug via INTRAVENOUS

## 2022-03-03 MED ORDER — PHENYLEPHRINE HCL-NACL 20-0.9 MG/250ML-% IV SOLN
INTRAVENOUS | Status: DC | PRN
  Administered 2022-03-03: 25 ug/min via INTRAVENOUS

## 2022-03-03 MED ORDER — ORAL CARE MOUTH RINSE: 15.0000 mL | Freq: Once | OROMUCOSAL | Status: AC

## 2022-03-03 MED ORDER — ALLOPURINOL 100 MG PO TABS
100.0000 mg | ORAL_TABLET | Freq: Every day | ORAL | Status: DC
  Administered 2022-03-04 – 2022-03-07 (×4): 100 mg via ORAL
  Filled 2022-03-03 (×5): qty 1

## 2022-03-03 MED ORDER — OXYCODONE HCL 5 MG PO TABS
5.0000 mg | ORAL_TABLET | ORAL | Status: DC | PRN
  Administered 2022-03-03 – 2022-03-07 (×3): 5 mg via ORAL
  Filled 2022-03-03 (×3): qty 1

## 2022-03-03 MED ORDER — ACETAMINOPHEN 650 MG RE SUPP
650.0000 mg | Freq: Four times a day (QID) | RECTAL | Status: DC | PRN
Start: 2022-03-03 — End: 2022-03-07

## 2022-03-03 MED ORDER — APIXABAN 5 MG PO TABS
5.0000 mg | ORAL_TABLET | Freq: Two times a day (BID) | ORAL | Status: DC
  Administered 2022-03-05 – 2022-03-07 (×5): 5 mg via ORAL
  Filled 2022-03-03 (×5): qty 1

## 2022-03-03 MED ORDER — GABAPENTIN 400 MG PO CAPS
800.0000 mg | ORAL_CAPSULE | Freq: Three times a day (TID) | ORAL | Status: DC
  Administered 2022-03-03 – 2022-03-07 (×12): 800 mg via ORAL
  Filled 2022-03-03 (×12): qty 2

## 2022-03-03 MED ORDER — ACETAMINOPHEN 500 MG PO TABS: 1000.0000 mg | ORAL_TABLET | Freq: Four times a day (QID) | ORAL | Status: DC | PRN

## 2022-03-03 MED ORDER — PHENYLEPHRINE HCL-NACL 20-0.9 MG/250ML-% IV SOLN
INTRAVENOUS | Status: AC
Start: 2022-03-03 — End: ?
  Filled 2022-03-03: qty 250

## 2022-03-03 MED ORDER — FENTANYL CITRATE (PF) 100 MCG/2ML IJ SOLN: INTRAMUSCULAR | Status: DC | PRN

## 2022-03-03 MED ORDER — ONDANSETRON HCL 4 MG PO TABS: 4.0000 mg | ORAL_TABLET | Freq: Four times a day (QID) | ORAL | Status: DC | PRN

## 2022-03-03 MED ORDER — OMEGA-3-ACID ETHYL ESTERS 1 G PO CAPS
1.0000 g | ORAL_CAPSULE | Freq: Every day | ORAL | Status: DC
  Administered 2022-03-04 – 2022-03-07 (×4): 1 g via ORAL
  Filled 2022-03-03 (×4): qty 1

## 2022-03-03 MED ORDER — FAMOTIDINE 20 MG PO TABS: 20.0000 mg | ORAL_TABLET | Freq: Once | ORAL | Status: AC

## 2022-03-03 MED ORDER — INSULIN ASPART 100 UNIT/ML IJ SOLN: 0.0000 [IU] | Freq: Every day | INTRAMUSCULAR | Status: DC

## 2022-03-03 MED ORDER — DEXAMETHASONE SODIUM PHOSPHATE 10 MG/ML IJ SOLN
INTRAMUSCULAR | Status: DC | PRN
  Administered 2022-03-03: 5 mg via INTRAVENOUS

## 2022-03-03 MED ORDER — ACETAMINOPHEN 500 MG PO TABS: 500.0000 mg | ORAL_TABLET | Freq: Four times a day (QID) | ORAL | Status: DC

## 2022-03-03 MED ORDER — EPHEDRINE SULFATE (PRESSORS) 50 MG/ML IJ SOLN
INTRAMUSCULAR | Status: DC | PRN
  Administered 2022-03-03: 10 mg via INTRAVENOUS

## 2022-03-03 MED ORDER — HEPARIN SODIUM (PORCINE) 5000 UNIT/ML IJ SOLN: 5000.0000 [IU] | Freq: Three times a day (TID) | INTRAMUSCULAR | Status: DC

## 2022-03-03 MED ORDER — MIDAZOLAM HCL 2 MG/2ML IJ SOLN
INTRAMUSCULAR | Status: DC | PRN
  Administered 2022-03-03: 2 mg via INTRAVENOUS

## 2022-03-03 MED ORDER — LEVOTHYROXINE SODIUM 88 MCG PO TABS: 88.0000 ug | ORAL_TABLET | Freq: Every day | ORAL | Status: DC

## 2022-03-03 MED ORDER — PHENYLEPHRINE 80 MCG/ML (10ML) SYRINGE FOR IV PUSH (FOR BLOOD PRESSURE SUPPORT)
PREFILLED_SYRINGE | INTRAVENOUS | Status: AC
  Filled 2022-03-03: qty 10

## 2022-03-03 MED ORDER — SODIUM CHLORIDE 0.9 % IV SOLN: INTRAVENOUS | Status: DC

## 2022-03-03 MED ORDER — SODIUM CHLORIDE FLUSH 0.9 % IV SOLN
INTRAVENOUS | Status: AC
  Filled 2022-03-03: qty 10

## 2022-03-03 MED ORDER — BUPIVACAINE HCL (PF) 0.5 % IJ SOLN
INTRAMUSCULAR | Status: AC
  Filled 2022-03-03: qty 40

## 2022-03-03 MED ORDER — SUCCINYLCHOLINE CHLORIDE 200 MG/10ML IV SOSY
PREFILLED_SYRINGE | INTRAVENOUS | Status: DC | PRN
  Administered 2022-03-03: 100 mg via INTRAVENOUS

## 2022-03-03 MED ORDER — ONDANSETRON HCL 4 MG/2ML IJ SOLN
INTRAMUSCULAR | Status: DC | PRN
  Administered 2022-03-03: 4 mg via INTRAVENOUS

## 2022-03-03 MED ORDER — SUGAMMADEX SODIUM 200 MG/2ML IV SOLN
INTRAVENOUS | Status: DC | PRN
  Administered 2022-03-03: 200 mg via INTRAVENOUS

## 2022-03-03 MED ORDER — MIDAZOLAM HCL 2 MG/2ML IJ SOLN
INTRAMUSCULAR | Status: AC
  Filled 2022-03-03: qty 2

## 2022-03-03 MED ORDER — GLYCOPYRROLATE 0.2 MG/ML IJ SOLN
INTRAMUSCULAR | Status: DC | PRN
  Administered 2022-03-03: .2 mg via INTRAVENOUS

## 2022-03-03 MED ORDER — COLCHICINE 0.6 MG PO TABS: 0.6000 mg | ORAL_TABLET | Freq: Every day | ORAL | Status: DC | PRN

## 2022-03-03 MED ORDER — ACETAMINOPHEN 10 MG/ML IV SOLN
INTRAVENOUS | Status: AC
Start: 2022-03-03 — End: ?
  Filled 2022-03-03: qty 100

## 2022-03-03 MED ORDER — METFORMIN HCL 500 MG PO TABS
1000.0000 mg | ORAL_TABLET | Freq: Two times a day (BID) | ORAL | Status: DC
  Administered 2022-03-03 – 2022-03-07 (×8): 1000 mg via ORAL
  Filled 2022-03-03 (×8): qty 2

## 2022-03-03 MED ORDER — VITAMIN D (ERGOCALCIFEROL) 1.25 MG (50000 UNIT) PO CAPS
50000.0000 [IU] | ORAL_CAPSULE | ORAL | Status: DC
  Administered 2022-03-05: 50000 [IU] via ORAL
  Filled 2022-03-03: qty 1

## 2022-03-03 MED ORDER — ACETAMINOPHEN 325 MG PO TABS: 650.0000 mg | ORAL_TABLET | Freq: Four times a day (QID) | ORAL | Status: DC | PRN

## 2022-03-03 MED ORDER — OXYCODONE HCL 5 MG PO TABS: 5.0000 mg | ORAL_TABLET | Freq: Once | ORAL | Status: DC | PRN

## 2022-03-03 MED ORDER — FENTANYL CITRATE (PF) 100 MCG/2ML IJ SOLN
INTRAMUSCULAR | Status: AC
  Filled 2022-03-03: qty 2

## 2022-03-03 MED ORDER — CEFAZOLIN SODIUM-DEXTROSE 2-4 GM/100ML-% IV SOLN
2.0000 g | INTRAVENOUS | Status: AC
  Administered 2022-03-03: 2 g via INTRAVENOUS

## 2022-03-03 MED ORDER — DROPERIDOL 2.5 MG/ML IJ SOLN: 0.6250 mg | Freq: Once | INTRAMUSCULAR | Status: DC | PRN

## 2022-03-03 MED ORDER — CEFAZOLIN SODIUM-DEXTROSE 2-4 GM/100ML-% IV SOLN
INTRAVENOUS | Status: AC
  Filled 2022-03-03: qty 100

## 2022-03-03 MED ORDER — ONDANSETRON HCL 4 MG/2ML IJ SOLN
4.0000 mg | Freq: Four times a day (QID) | INTRAMUSCULAR | Status: DC | PRN
Start: 2022-03-03 — End: 2022-03-07

## 2022-03-03 MED ORDER — CHLORHEXIDINE GLUCONATE 0.12 % MT SOLN: 15.0000 mL | Freq: Once | OROMUCOSAL | Status: AC

## 2022-03-03 MED ORDER — INSULIN ASPART 100 UNIT/ML IJ SOLN
0.0000 [IU] | Freq: Three times a day (TID) | INTRAMUSCULAR | Status: DC
  Administered 2022-03-03: 5 [IU] via SUBCUTANEOUS
  Administered 2022-03-04: 2 [IU] via SUBCUTANEOUS
  Administered 2022-03-04 (×2): 3 [IU] via SUBCUTANEOUS
  Administered 2022-03-05: 2 [IU] via SUBCUTANEOUS
  Administered 2022-03-05: 3 [IU] via SUBCUTANEOUS
  Administered 2022-03-05: 2 [IU] via SUBCUTANEOUS
  Administered 2022-03-06 (×2): 3 [IU] via SUBCUTANEOUS
  Administered 2022-03-07: 2 [IU] via SUBCUTANEOUS
  Administered 2022-03-07: 3 [IU] via SUBCUTANEOUS
  Filled 2022-03-03 (×11): qty 1

## 2022-03-03 MED ORDER — ATORVASTATIN CALCIUM 10 MG PO TABS
10.0000 mg | ORAL_TABLET | Freq: Every day | ORAL | Status: DC
  Administered 2022-03-04 – 2022-03-07 (×4): 10 mg via ORAL
  Filled 2022-03-03 (×4): qty 1

## 2022-03-03 MED ORDER — ROCURONIUM BROMIDE 100 MG/10ML IV SOLN
INTRAVENOUS | Status: DC | PRN
  Administered 2022-03-03: 10 mg via INTRAVENOUS
  Administered 2022-03-03: 50 mg via INTRAVENOUS

## 2022-03-03 MED ORDER — PHENYLEPHRINE 80 MCG/ML (10ML) SYRINGE FOR IV PUSH (FOR BLOOD PRESSURE SUPPORT)
PREFILLED_SYRINGE | INTRAVENOUS | Status: DC | PRN
  Administered 2022-03-03: 80 ug via INTRAVENOUS
  Administered 2022-03-03: 160 ug via INTRAVENOUS
  Administered 2022-03-03: 80 ug via INTRAVENOUS
  Administered 2022-03-03: 160 ug via INTRAVENOUS

## 2022-03-03 MED ORDER — NYSTATIN 100000 UNIT/GM EX CREA
1.0000 | TOPICAL_CREAM | Freq: Two times a day (BID) | CUTANEOUS | Status: DC | PRN
Start: 2022-03-03 — End: 2022-03-07
  Filled 2022-03-03: qty 30

## 2022-03-03 MED ORDER — POTASSIUM CHLORIDE CRYS ER 20 MEQ PO TBCR
30.0000 meq | EXTENDED_RELEASE_TABLET | Freq: Once | ORAL | Status: AC
  Administered 2022-03-03: 30 meq via ORAL
  Filled 2022-03-03: qty 1

## 2022-03-03 MED ORDER — METOPROLOL TARTRATE 5 MG/5ML IV SOLN: 5.0000 mg | INTRAVENOUS | Status: DC | PRN

## 2022-03-03 MED ORDER — RINGERS IRRIGATION IR SOLN
Status: DC | PRN
  Administered 2022-03-03: 6000 mL

## 2022-03-03 MED ORDER — FENTANYL CITRATE PF 50 MCG/ML IJ SOSY
PREFILLED_SYRINGE | INTRAMUSCULAR | Status: AC
  Filled 2022-03-03: qty 1

## 2022-03-03 MED ORDER — ALBUTEROL SULFATE (2.5 MG/3ML) 0.083% IN NEBU
2.5000 mg | INHALATION_SOLUTION | Freq: Four times a day (QID) | RESPIRATORY_TRACT | Status: DC | PRN
Start: 2022-03-03 — End: 2022-03-07

## 2022-03-03 MED ORDER — OXYCODONE HCL 5 MG/5ML PO SOLN: 5.0000 mg | Freq: Once | ORAL | Status: DC | PRN

## 2022-03-03 MED ORDER — ACETAMINOPHEN 500 MG PO TABS: 1000.0000 mg | ORAL_TABLET | Freq: Once | ORAL | Status: DC | PRN

## 2022-03-03 MED ORDER — HEPARIN SODIUM (PORCINE) 5000 UNIT/ML IJ SOLN
INTRAMUSCULAR | Status: AC
  Filled 2022-03-03: qty 1

## 2022-03-03 MED ORDER — CHLORHEXIDINE GLUCONATE 0.12 % MT SOLN
OROMUCOSAL | Status: AC
  Administered 2022-03-03: 15 mL via OROMUCOSAL
  Filled 2022-03-03: qty 15

## 2022-03-03 MED ORDER — FUROSEMIDE 40 MG PO TABS
40.0000 mg | ORAL_TABLET | Freq: Every day | ORAL | Status: DC
  Administered 2022-03-04 – 2022-03-07 (×4): 40 mg via ORAL
  Filled 2022-03-03 (×4): qty 1

## 2022-03-03 MED ORDER — EPHEDRINE 5 MG/ML INJ
INTRAVENOUS | Status: AC
Start: 2022-03-03 — End: ?
  Filled 2022-03-03: qty 5

## 2022-03-03 MED ORDER — FENTANYL CITRATE (PF) 100 MCG/2ML IJ SOLN: 25.0000 ug | INTRAMUSCULAR | Status: DC | PRN

## 2022-03-03 MED ORDER — LEVOTHYROXINE SODIUM 88 MCG PO TABS
88.0000 ug | ORAL_TABLET | Freq: Every day | ORAL | Status: DC
  Administered 2022-03-04 – 2022-03-07 (×4): 88 ug via ORAL
  Filled 2022-03-03 (×4): qty 1

## 2022-03-03 SURGICAL SUPPLY — 82 items
ADAPTER IRRIG TUBE 2 SPIKE SOL (ADAPTER) ×6 IMPLANT
BIT DRILL CANN COMP 5.0 (BIT) ×1 IMPLANT
BLADE SHAVER 2.9D 7 MINI (BLADE) ×4 IMPLANT
BLADE SURG 15 STRL LF DISP TIS (BLADE) ×2 IMPLANT
BLADE SURG 15 STRL SS (BLADE) ×3
BNDG COHESIVE 4X5 TAN ST LF (GAUZE/BANDAGES/DRESSINGS) ×3 IMPLANT
BNDG ELASTIC 4X5.8 VLCR NS LF (GAUZE/BANDAGES/DRESSINGS) ×6 IMPLANT
BNDG ELASTIC 4X5.8 VLCR STR LF (GAUZE/BANDAGES/DRESSINGS) ×1 IMPLANT
BNDG ELASTIC 6X5.8 VLCR STR LF (GAUZE/BANDAGES/DRESSINGS) ×3 IMPLANT
BNDG ESMARK 4X12 TAN STRL LF (GAUZE/BANDAGES/DRESSINGS) ×3 IMPLANT
BNDG GAUZE DERMACEA FLUFF (GAUZE/BANDAGES/DRESSINGS) ×1
BNDG GAUZE DERMACEA FLUFF 4 (GAUZE/BANDAGES/DRESSINGS) ×2 IMPLANT
BNDG STRETCH GAUZE 3IN X12FT (GAUZE/BANDAGES/DRESSINGS) ×3 IMPLANT
BUR ABRADER 2.9 BARREL PURPL (BURR) IMPLANT
BURR  ABRADER 2.9 BARREL PURPL (BURR) ×3
BURR ABRADER 2.9 BARREL PURPL (BURR) ×2
DRAPE ARTHRO LIMB 89X125 STRL (DRAPES) ×3 IMPLANT
DRAPE C-ARM XRAY 36X54 (DRAPES) ×3 IMPLANT
DRAPE C-ARMOR (DRAPES) ×1 IMPLANT
DRAPE IMP U-DRAPE 54X76 (DRAPES) ×3 IMPLANT
DRAPE SHEET LG 3/4 BI-LAMINATE (DRAPES) ×3 IMPLANT
DRAPE U-SHAPE 47X51 STRL (DRAPES) ×3 IMPLANT
DRSG EMULSION OIL 3X3 NADH (GAUZE/BANDAGES/DRESSINGS) ×3 IMPLANT
DURAPREP 26ML APPLICATOR (WOUND CARE) ×3 IMPLANT
ELECT REM PT RETURN 9FT ADLT (ELECTROSURGICAL) ×3
ELECTRODE REM PT RTRN 9FT ADLT (ELECTROSURGICAL) ×2 IMPLANT
ETHIBOND 2 0 GREEN CT 2 30IN (SUTURE) ×3 IMPLANT
GAUZE SPONGE 4X4 12PLY STRL (GAUZE/BANDAGES/DRESSINGS) ×1 IMPLANT
GAUZE STRETCH 2X75IN STRL (MISCELLANEOUS) ×3 IMPLANT
GAUZE XEROFORM 1X8 LF (GAUZE/BANDAGES/DRESSINGS) ×1 IMPLANT
GLOVE BIO SURGEON STRL SZ7 (GLOVE) ×3 IMPLANT
GLOVE BIOGEL PI IND STRL 7.0 (GLOVE) ×2 IMPLANT
GLOVE BIOGEL PI INDICATOR 7.0 (GLOVE) ×1
GOWN STRL REUS W/ TWL LRG LVL3 (GOWN DISPOSABLE) ×2 IMPLANT
GOWN STRL REUS W/ TWL XL LVL3 (GOWN DISPOSABLE) ×4 IMPLANT
GOWN STRL REUS W/TWL LRG LVL3 (GOWN DISPOSABLE) ×3
GOWN STRL REUS W/TWL XL LVL3 (GOWN DISPOSABLE) ×6
GUIDEWIRE W/TRCR TIP 2.4X9.25 (WIRE) ×3 IMPLANT
HARVESTER BONE GRAFT 6 (INSTRUMENTS) ×2 IMPLANT
IV LACTATED RINGER IRRG 3000ML (IV SOLUTION) ×6
IV LR IRRIG 3000ML ARTHROMATIC (IV SOLUTION) ×10 IMPLANT
KIT BONE MRW ASP ANGEL CPRP (KITS) ×1 IMPLANT
KIT TURNOVER KIT A (KITS) ×3 IMPLANT
LABEL OR SOLS (LABEL) ×3 IMPLANT
MANIFOLD NEPTUNE II (INSTRUMENTS) ×6 IMPLANT
NDL HYPO 25X1 1.5 SAFETY (NEEDLE) ×2 IMPLANT
NDL SAFETY ECLIPSE 18X1.5 (NEEDLE) ×2 IMPLANT
NEEDLE HYPO 18GX1.5 SHARP (NEEDLE) ×3
NEEDLE HYPO 25X1 1.5 SAFETY (NEEDLE) ×3 IMPLANT
PACK ARTHROSCOPY KNEE (MISCELLANEOUS) ×3 IMPLANT
PAD ABD DERMACEA PRESS 5X9 (GAUZE/BANDAGES/DRESSINGS) ×1 IMPLANT
PAD CAST CTTN 4X4 STRL (SOFTGOODS) ×2 IMPLANT
PADDING CAST COTTON 4X4 STRL (SOFTGOODS) ×3
PENCIL SMOKE EVACUATOR (MISCELLANEOUS) ×3 IMPLANT
PUTTY DBM ALLOSYNC PURE 10CC (Putty) ×1 IMPLANT
SCREW COMPR FT 7X40 (Screw) ×1 IMPLANT
SCREW COMPR FT 7X50 (Screw) ×1 IMPLANT
SCREW COMPR FT 7X65 (Screw) ×1 IMPLANT
SPIKE FLUID TRANSFER (MISCELLANEOUS) ×3 IMPLANT
SPLINT CAST 1 STEP 4X15 (MISCELLANEOUS) ×1 IMPLANT
SPLINT CAST 1 STEP 4X30 (MISCELLANEOUS) ×2 IMPLANT
SPONGE T-LAP 18X18 ~~LOC~~+RFID (SPONGE) ×3 IMPLANT
STOCKINETTE M/LG 89821 (MISCELLANEOUS) ×3 IMPLANT
STRAP ANKLE FOOT DISTRACTOR (ORTHOPEDIC SUPPLIES) ×3 IMPLANT
STRAP SAFETY 5IN WIDE (MISCELLANEOUS) ×3 IMPLANT
SUT ETH BLK MONO 3 0 FS 1 12/B (SUTURE) ×3 IMPLANT
SUT ETHIBOND GREEN BRAID 0S 4 (SUTURE) ×12 IMPLANT
SUT ETHILON 3 0 PS 1 (SUTURE) ×1 IMPLANT
SUT ETHILON 4-0 (SUTURE) ×3
SUT ETHILON 4-0 FS2 18XMFL BLK (SUTURE) ×2
SUT ETHILON NAB PS2 4-0 18IN (SUTURE) ×3 IMPLANT
SUT PDS II 3-0 (SUTURE) ×3 IMPLANT
SUT VIC AB 3-0 SH 27 (SUTURE) ×3
SUT VIC AB 3-0 SH 27X BRD (SUTURE) ×2 IMPLANT
SUT VIC AB 4-0 SH 27 (SUTURE) ×3
SUT VIC AB 4-0 SH 27XANBCTRL (SUTURE) ×2 IMPLANT
SUTURE ETHLN 4-0 FS2 18XMF BLK (SUTURE) ×2 IMPLANT
TUBING CONNECTING 10 (TUBING) ×3 IMPLANT
TUBING INFLOW SET DBFLO PUMP (TUBING) ×3 IMPLANT
TUBING OUTFLOW SET DBLFO PUMP (TUBING) ×3 IMPLANT
WATER STERILE IRR 1000ML POUR (IV SOLUTION) ×3 IMPLANT
WATER STERILE IRR 500ML POUR (IV SOLUTION) ×3 IMPLANT

## 2022-03-03 NOTE — Hospital Course (Addendum)
[name] is a 76 year old female with history of gout, hypothyroid, hyperlipidemia, non-insulin-dependent diabetes mellitus, osteoarthritis, atrial fibrillation, who was taken to the operating room by podiatry for chief concerns of osteomyelitis and underwent ankle transfusion.  Initial vitals prior to procedure showed temperature of 97.3, respiration rate of 18, heart rate of 63, blood pressure 125/68, SPO2 of 96% on room air.  Podiatry service requesting hospital admission for placement.  BMP was ordered and serum sodium showed 141, potassium 3.3, chloride 108, bicarb 23, nonfasting blood glucose 184, BUN of 15, serum creatinine of 0.59, GFR greater than 60.  CBC with differential with unremarkable.  7/29: Pending PT/OT evaluation.  Patient most likely will need placement.  She will be nonweightbearing on left side.  Eliquis is being held until tomorrow, Sunday, 03/05/2022.  7/30: Patient remained stable.  PT is recommending rehab.  Eliquis is being restarted today. Having some right knee pain after working with PT and hopping on 1 leg as she will remain nonweightbearing on left.  7/31: Patient remained stable.  Had 1 bed offer at [facility], awaiting insurance authorization.  Started getting pain in the left lower extremity as nerve block is now weaning off.  8/1: Remained stable and being discharged to rehab for further management. Patient will continue on current medications follow-up with her providers.

## 2022-03-03 NOTE — Assessment & Plan Note (Signed)
Resolved.  Magnesium at 1.9 -Monitor and replete as needed

## 2022-03-03 NOTE — Anesthesia Procedure Notes (Signed)

## 2022-03-03 NOTE — Transfer of Care (Signed)
Immediate Anesthesia Transfer of Care Note  Patient: Teresa [name]  Procedure(s) Performed: ANKLE ARTHROSCOPY (Left: Ankle) BONE GRAFT (Left) POSSIBLE HARDWARE REMOVAL (Left)  Patient Location: PACU  Anesthesia Type:General  Level of Consciousness: awake, drowsy and patient cooperative  Airway & Oxygen Therapy: Patient Spontanous Breathing and Patient connected to face mask oxygen  Post-op Assessment: Report given to RN and Post -op Vital signs reviewed and stable  Post vital signs: Reviewed and stable  Last Vitals:  Vitals Value Taken Time  BP 119/59 03/03/22 1403  Temp 36.4 C 03/03/22 1403  Pulse 65 03/03/22 1406  Resp 16 03/03/22 1406  SpO2 100 % 03/03/22 1406  Vitals shown include unvalidated device data.  Last Pain:  Vitals:   03/03/22 1403  TempSrc:   PainSc: 0-No pain         Complications: No notable events documented.

## 2022-03-03 NOTE — Interval H&P Note (Signed)
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  The various methods of treatment have been discussed with the patient and family. After consideration of risks, benefits and other options for treatment, the patient has consented to  Procedure(s) with comments: ANKLE ARTHROSCOPY (Left) - GENERAL WITH POP AND SAPHENOUS BLOCK BONE GRAFT (Left) POSSIBLE HARDWARE REMOVAL (Left) as a surgical intervention.  The patient's history has been reviewed, patient examined, no change in status, stable for surgery.  I have reviewed the patient's chart and labs.  Questions were answered to the patient's satisfaction.     Criselda Peaches

## 2022-03-03 NOTE — H&P (Addendum)
History and Physical   Teresa [name] GYK:[id number] DOB: 13-Feb-1946 DOA: 03/03/2022  PCP: [name], MD  Outpatient Specialists: Dr. Sherryle Lis, podiatry Patient coming from: Home  I have personally briefly reviewed patient's old medical records in [place].  Chief Concern: Osteoarthritis  HPI: [name] is a 76 year old female with history of gout, hypothyroid, hyperlipidemia, non-insulin-dependent diabetes mellitus, osteoarthritis, atrial fibrillation, who was taken to the operating room by podiatry for chief concerns of osteomyelitis and underwent ankle transfusion.  Initial vitals prior to procedure showed temperature of 97.3, respiration rate of 18, heart rate of 63, blood pressure 125/68, SPO2 of 96% on room air.  Podiatry service requesting hospital admission for placement.  BMP was ordered and serum sodium showed 141, potassium 3.3, chloride 108, bicarb 23, nonfasting blood glucose 184, BUN of 15, serum creatinine of 0.59, GFR greater than 60.  CBC with differential with unremarkable.  At bedside patient was able to tell me her full name, her age, she knows the current calendar year and her current location.  She endorses numbness of the left lower extremity as she received a pain block during procedure.  At bedside she denies vomiting, fever, new cough, chest pain, abdominal pain, shortness of breath, dysuria, hematuria, diarrhea.  She endorses nausea.  Social history: A grandchild lives with her.  She denies tobacco, EtOH, recreational drug use.  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  ROS: Constitutional: no weight change, no fever ENT/Mouth: no sore throat, no rhinorrhea Eyes: no eye pain, no vision changes Cardiovascular: no chest pain, no dyspnea,  no edema, no palpitations Respiratory: no cough, no sputum, no wheezing Gastrointestinal: no nausea, no vomiting, no diarrhea, no constipation Genitourinary: no  urinary incontinence, no dysuria, no hematuria Musculoskeletal: no arthralgias, no myalgias, + of the left lower extremity Skin: no skin lesions, no pruritus, Neuro: + weakness, no loss of consciousness, no syncope Psych: no anxiety, no depression, no decrease appetite Heme/Lymph: no bruising, no bleeding  Assessment/Plan  Principal Problem:   Status post ankle arthrodesis Active Problems:   Hypothyroidism   Type 2 diabetes mellitus with diabetic neuropathy, without long-term current use of insulin (HCC)   Anxiety state   Hypertensive heart disease without heart failure   Osteoarthritis   Sleep apnea   Hypokalemia   Atrial fibrillation (HCC)   Assessment and Plan:  * Status post ankle arthrodesis - Status post left arthroscopic ankle arthrodesis, bone graft, removal of hardware - TOC, PT, OT - Pain control per podiatry: Oxycodone 5 mg p.o. every 4 hours as needed for moderate pain, oxycodone 10 mg every 4 hours as needed for severe pain - Nonweightbearing on the left side  Atrial fibrillation (HCC) - Holding home Eliquis 5 mg p.o. twice daily until Sunday, 03/05/2022 due to increased risk of bleeding postprocedure - Metoprolol 5 mg IV every 2 hours as needed for heart rate greater than 120, 4 doses ordered  Hypokalemia - Check magnesium level on prior collection - Potassium chloride 30 mill equivalent one-time dose ordered - BMP in a.m.  Type 2 diabetes mellitus with diabetic neuropathy, without long-term current use of insulin (HCC) - Resumed home metformin 1000 mg p.o. twice daily - Holding home glipizide and Actos - Insulin SSI with at bedtime coverage ordered - Goal inpatient blood glucose level is 140-180  Chart reviewed.   DVT prophylaxis: Heparin 5000 units subcutaneous starting on 03/04/2022 at 1400, 2 doses and then Eliquis resumption  for 03/05/2022 Code Status: Full code Diet: Heart healthy/carb modified Family Communication: No Disposition Plan: Pending  clinical course Consults called: PT, OT, TOC Admission status: Inpatient, MedSurg  Past Medical History:  Diagnosis Date   Anginal pain ([place])    Aortic atherosclerosis ([place])    Arthritis    Asthma    Atrial fibrillation ([place])    a.) CHA2DS2-VASc = 7 (age x2, sex, CHF, HTN, vascular disease history, T2DM);  b.) rate/rhythm maintained without pharmacological intervention; chronically anticoagulated using standard dose apixaban   Bilateral lower extremity edema    CHF (congestive heart failure) ([place])    Complication of anesthesia    a.) PONV   COPD (chronic obstructive pulmonary disease) ([place])    Coronary artery disease    a.) cCTA 11/01/2021: Ca+ score 174 (67th percentile for age/sex match) --> FFR demonstrated no hemodynamically significant stenosis   Depression    DOE (dyspnea on exertion)    GERD (gastroesophageal reflux disease)    Gout    Heart murmur    History of 2019 novel coronavirus disease (COVID-19) 05/12/2019   History of hiatal hernia    History of kidney stones    History of orthopnea    HLD (hyperlipidemia)    Hypertension    Hypothyroidism    Long term current use of anticoagulant    a.) apixaban   Migraines    Mobitz type I Wenckebach atrioventricular block    a.) holter study 01/27/2022   Palpitations    PONV (postoperative nausea and vomiting)    PSVT (paroxysmal supraventricular tachycardia) ([place]) 01/27/2022   a.) holter study 01/27/2022 - longest lasting 17 beats at a rate of 109 bpm   Pulmonary HTN (HCC)    RBBB (right bundle branch block)    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   Sleep apnea    a.) does not require nocturnal PAP therapy   T2DM (type 2 diabetes mellitus) ([place])    Vertigo    Vitamin D deficiency    Past Surgical History:  Procedure Laterality Date   ABDOMINAL HYSTERECTOMY     ovaries intact   BLADDER SURGERY     bladder tuck   BREAST BIOPSY Right 03/20/2016   neg x 2 area   CARDIAC CATHETERIZATION     CARPAL TUNNEL RELEASE      CATARACT EXTRACTION W/PHACO Left 09/17/2018   Procedure: CATARACT EXTRACTION PHACO AND INTRAOCULAR LENS PLACEMENT ([place]) LEFT, DIABETIC;  Surgeon: Birder Robson, MD;  Location: [facility] ORS;  Service: Ophthalmology;  Laterality: Left;  [place] 00:34 CDE 4.85 Fluid pack lot # [id number]   CATARACT EXTRACTION W/PHACO Right 10/15/2018   Procedure: CATARACT EXTRACTION PHACO AND INTRAOCULAR LENS PLACEMENT ([facility])-RIGHT;  Surgeon: Birder Robson, MD;  Location: [facility] OR[facility];  Service: Ophthalmology;  Laterality: Right;  [place] 00:27.6 CDE 3.43 Fluid Pack Lot # [id number]   COLONOSCOPY WITH PROPOFOL N/A 09/27/2018   Procedure: COLONOSCOPY WITH PROPOFOL;  Surgeon: Virgel Manifold, MD;  Location: [facility] ENDOSCOPY;  Service: Endoscopy;  Laterality: N/A;   DILATION AND CURETTAGE OF UTERUS     EYE SURGERY     eyelid   FRACTURE SURGERY     left ankle-plate and screws palced   LITHOTRIPSY     NISSEN FUNDOPLICATION     SHOULDER SURGERY Right    rotator cuff   TONSILLECTOMY     Social History:  reports that she has never smoked. She has never used smokeless tobacco. She reports that she does not drink alcohol and does not  use drugs.  Allergies  Allergen Reactions   Clarithromycin Nausea And Vomiting and Other (See Comments)   Codeine     Hallucination   Dilaudid  [Hydromorphone Hcl] Nausea And Vomiting   Iodine Hives and Itching   Iohexol Hives and Itching   Onion Diarrhea and Nausea And Vomiting    Abdominal pain   Tamiflu  [Oseltamivir Phosphate] Nausea And Vomiting    Abdominal Pain,   Zolpidem Nausea And Vomiting and Other (See Comments)    Hallucinations   Bacitracin-Neomycin-Polymyxin Itching and Rash   Benzalkonium Chloride Itching, Rash and Swelling   Lidocaine Hcl Itching, Rash and Swelling   Morphine Nausea And Vomiting and Rash   Neomycin-Bacitracin Zn-Polymyx Itching and Rash   Tape Itching and Rash    Adhesive tape - silicone   Tapentadol Rash   Family History  Problem Relation Age of  Onset   Stroke Mother    Hypertension Mother    Heart disease Mother    Arthritis Mother    Heart disease Father    Hypertension Father    Diabetes Sister    Hypertension Sister    Asthma Sister    Hypertension Sister    Diverticulitis Sister    Colon cancer Maternal Grandmother    Breast cancer Maternal Grandmother    Breast cancer Maternal Aunt    Family history: Family history reviewed and not pertinent.  Prior to Admission medications   Medication Sig Start Date End Date Taking? Authorizing Provider  acetaminophen (TYLENOL) 500 MG tablet Take 1,000 mg by mouth every 6 (six) hours as needed for mild pain or headache.   Yes [provider]  albuterol (VENTOLIN HFA) 108 (90 Base) MCG/ACT inhaler Inhale 2 puffs into the lungs every 6 (six) hours as needed for shortness of breath. 03/26/21  Yes [provider]  allopurinol (ZYLOPRIM) 100 MG tablet TAKE 1 TABLET BY MOUTH EVERY DAY Patient taking differently: Take 100 mg by mouth at bedtime. 08/23/21  Yes [name], MD  atorvastatin (LIPITOR) 10 MG tablet TAKE 1 TABLET BY MOUTH EVERYDAY AT BEDTIME 01/06/21  Yes [name], MD  colchicine 0.6 MG tablet Take 0.6 mg by mouth daily as needed (gout).   Yes [provider]  furosemide (LASIX) 40 MG tablet Take 1 tablet (40 mg total) by mouth daily. 12/12/21  Yes [name], MD  gabapentin (NEURONTIN) 800 MG tablet Take 800 mg by mouth 3 (three) times daily. 08/15/21  Yes [provider]  glimepiride (AMARYL) 4 MG tablet TAKE ONE TABLET BY MOUTH EVERY MORNING and TAKE ONE TABLET BY MOUTH EVERYDAY AT BEDTIME 03/02/22  Yes [name], MD  JARDIANCE 25 MG TABS tablet TAKE 25 MG BY MOUTH DAILY. 08/23/21  Yes [name], MD  levothyroxine (SYNTHROID) 88 MCG tablet TAKE 1 TABLET BY MOUTH EVERY DAY 08/23/21  Yes [name], MD  nystatin cream (MYCOSTATIN) Apply 1 application topically 2 (two) times  daily. Patient taking differently: Apply 1 application  topically 2 (two) times daily as needed. 10/04/17  Yes [name], MD  pioglitazone (ACTOS) 30 MG tablet TAKE ONE TABLET BY MOUTH EVERY MORNING 03/02/22  Yes [name], MD  Semaglutide, 1 MG/DOSE, 4 MG/3ML SOPN Inject 1 mg as directed once a week. 10/12/21  Yes [name], MD  apixaban (ELIQUIS) 5 MG TABS tablet Take 5 mg by mouth 2 (two) times daily. 07/25/21  [provider]  metFORMIN (GLUCOPHAGE) 1000 MG tablet TAKE 1 TABLET BY MOUTH TWICE A DAY 06/27/21   [name], MD  Omega-3 Fatty Acids (FISH OIL PO) Take 1 capsule by mouth daily.    [provider]  ondansetron (ZOFRAN) 4 MG tablet TAKE 1 TABLET BY MOUTH EVERY 8 HOURS AS NEEDED FOR NAUSEA AND VOMITING 08/21/19   [name], MD  Vitamin D, Ergocalciferol, (DRISDOL) 1.25 MG (50000 UNIT) CAPS capsule TAKE ONE CAPSULE BY MOUTH ONCE WEEKLY ON SUNDAY 02/27/22   Criselda Peaches, DPM   Physical Exam: Vitals:   03/03/22 1430 03/03/22 1445 03/03/22 1500 03/03/22 1535  BP: (!) 114/58 (!) 113/55 113/60 115/62  Pulse: 66 63 66 60  Resp: '15 12 14 18  '$ Temp:   98 F (36.7 C) 97.6 F (36.4 C)  TempSrc:      SpO2: 94% 97% 99% 94%  Weight:      Height:       Constitutional: appears age-appropriate, frail, NAD, calm, comfortable Eyes: PERRL, lids and conjunctivae normal ENMT: Mucous membranes are moist. Posterior pharynx clear of any exudate or lesions. Age-appropriate dentition. Hearing appropriate Neck: normal, supple, no masses, no thyromegaly Respiratory: clear to auscultation bilaterally, no wheezing, no crackles. Normal respiratory effort. No accessory muscle use.  Cardiovascular: Regular rate and rhythm, no murmurs / rubs / gallops. No extremity edema. 2+ pedal pulses. No carotid bruits.  Abdomen: Obese abdomen, no tenderness, no masses palpated, no hepatosplenomegaly. Bowel sounds positive.  Musculoskeletal:  no clubbing / cyanosis. No joint deformity upper and lower extremities. Good ROM, no contractures, no atrophy. Normal muscle tone.  Skin: no rashes, lesions, ulcers. No induration Neurologic: Decreased sensation of the left lower extremity.  Strength 5/5 in all 4.  Psychiatric: Normal judgment and insight. Alert and oriented x 3. Normal mood.   EKG: Not indicated at this time Chest x-ray on Admission: Not indicated at this time  DG Ankle Complete Left  Result Date: 03/03/2022 CLINICAL DATA:  Previous trauma, pain EXAM: LEFT ANKLE COMPLETE - 3+ VIEW COMPARISON:  11/30/2021 FINDINGS: There is interval removal of 2 surgical screws from the medial malleolus. There is interval surgical fusion of tibiotalar joint. Metallic plate and surgical screws in the distal shaft of fibula have not changed. 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 IMPRESSION: There is interval surgical fusion of tibiotalar joint. Electronically Signed   By: Elmer Picker M.D.   On: 03/03/2022 14:58   DG C-Arm 1-60 Min-No Report  Result Date: 03/03/2022 CLINICAL DATA:  Left ankle fusion EXAM: LEFT ANKLE - 2 VIEW; DG C-ARM 1-60 MIN-NO REPORT COMPARISON:  11/30/2021 FINDINGS: Spot fluoroscopic intraoperative views demonstrate crossing screw fixation of the tibiotalar joint for fusion. Advanced left ankle joint arthropathy. Previous distal fibula ORIF hardware noted. Normal alignment. No acute abnormality. Plantar calcaneal spur noted. IMPRESSION: Limited intraoperative imaging during left ankle fusion/revision. Stable alignment. Electronically Signed   By: Jerilynn Mages.  Shick M.D.   On: 03/03/2022 13:48   DG Ankle 2 Views Left  Result Date: 03/03/2022 CLINICAL DATA:  Left ankle fusion EXAM: LEFT ANKLE - 2 VIEW; DG C-ARM 1-60 MIN-NO REPORT COMPARISON:  11/30/2021 FINDINGS: Spot fluoroscopic intraoperative views demonstrate crossing screw fixation of the tibiotalar  joint for fusion. Advanced left ankle joint arthropathy. Previous distal fibula ORIF hardware noted. Normal alignment. No acute abnormality. Plantar calcaneal spur noted. IMPRESSION: Limited intraoperative imaging during left ankle fusion/revision. Stable  alignment. Electronically Signed   By: Jerilynn Mages.  Shick M.D.   On: 03/03/2022 13:48   [place] OR NERVE BLOCK-IMAGE ONLY [facility])  Result Date: 03/03/2022 There is no interpretation for this exam.  This order is for images obtained during a surgical procedure.  Please See "Surgeries" Tab for more information regarding the procedure.    Labs on Admission: I have personally reviewed following labs  CBC: Recent Labs  Lab 03/03/22 1525  WBC 7.5  NEUTROABS 6.2  HGB 13.8  HCT 42.3  MCV 91.2  PLT 638   Basic Metabolic Panel: Recent Labs  Lab 03/03/22 1525  NA 141  K 3.3*  CL 108  CO2 23  GLUCOSE 184*  BUN 15  CREATININE 0.59  CALCIUM 8.5*   GFR: Estimated Creatinine Clearance: 60.9 mL/min (by C-G formula based on SCr of 0.59 mg/dL).  CBG: Recent Labs  Lab 03/03/22 0852 03/03/22 1406 03/03/22 1652  GLUCAP 126* 176* 235*   Urine analysis:    Component Value Date/Time   COLORURINE YELLOW (A) 06/13/2021 2031   APPEARANCEUR HAZY (A) 06/13/2021 2031   APPEARANCEUR Hazy 05/14/2014 0956   LABSPEC 1.026 06/13/2021 2031   LABSPEC 1.028 05/14/2014 0956   PHURINE 5.0 06/13/2021 2031   GLUCOSEU >=500 (A) 06/13/2021 2031   GLUCOSEU Negative 05/14/2014 0956   HGBUR NEGATIVE 06/13/2021 2031   BILIRUBINUR NEGATIVE 06/13/2021 2031   BILIRUBINUR Negative 12/08/2015 0959   BILIRUBINUR Negative 05/14/2014 0956   KETONESUR 5 (A) 06/13/2021 2031   PROTEINUR NEGATIVE 06/13/2021 2031   UROBILINOGEN 0.2 12/08/2015 0959   NITRITE NEGATIVE 06/13/2021 2031   LEUKOCYTESUR SMALL (A) 06/13/2021 2031   LEUKOCYTESUR 3+ 05/14/2014 0956   Dr. Tobie Poet [facility]  If 7PM-7AM, please contact overnight-coverage provider If 7AM-7PM, please contact day  coverage provider [email or web address]  03/03/2022, 5:52 PM

## 2022-03-03 NOTE — Discharge Instructions (Signed)
Post-Surgery Instructions  1. If you are recuperating from surgery anywhere other than home, please be sure to leave [place] a number where you can be reached. 2. Go directly home and rest. 3. The keep operated foot (or feet) elevated six inches above the hip when sitting or lying down. 4. Support the elevated foot and leg with pillows under the calf. DO NOT PLACE PILLOWS UNDER THE KNEE. 5. DO NOT REMOVE or get your bandages wet. This will increase your chances of getting an infection. 6. Wear your surgical shoe at all times when you are up. 7. A limited amount of pain and swelling may occur. The skin may take on a bruised appearance. This is no cause for alarm. 8. For slight pain and swelling, apply an ice pack directly over the bandage for 15 minutes every hour. Continue icing until seen in the office. DO NOT apply any form of heat to the area. 9. Have prescription(s) filled immediately and take as directed. 10. Drink lots of liquids, water, and juice. 11. CALL THE OFFICE IMMEDIATELY IF: a. Bleeding continues b. Pain increases and/or does not respond to medication c. Bandage or cast appears too tight d. Any liquids (water, coffee, etc.) have spilled on your bandages. e. Tripping, falling, or stubbing the surgical foot f. If your temperature rises above 101 g. If you have ANY questions at all 12. Please use the crutches, knee scooter, or walker you have prescribed, rented, or purchased. If you are non-weight bearing DO NOT put weight on the operated foot for _________ days. If you are weight-bearing, follow your physician's instructions. You are expected to be: ? weight-bearing ? non-weight bearing 13. Special Instructions: _____________________________________________________________ _________________________________________________________________________________ _________________________________________________________________________________  14. Your next appointment is: 03/08/2022  1:45 PM  If you need to reach the nurse for any reason, please call: [place]: [phone] [place]: [phone] [place]: [phone]

## 2022-03-03 NOTE — Assessment & Plan Note (Signed)
-   Holding home Eliquis 5 mg p.o. twice daily until Sunday, 03/05/2022 due to increased risk of bleeding postprocedure - Metoprolol 5 mg IV every 2 hours as needed for heart rate greater than 120, 4 doses ordered

## 2022-03-03 NOTE — Brief Op Note (Signed)
03/03/2022  1:58 PM  PATIENT:  Teresa [name]  76 y.o. female  PRE-OPERATIVE DIAGNOSIS:  OSTEOARTHRITIS IN LEFT FOOT  POST-OPERATIVE DIAGNOSIS:  OSTEOARTHRITIS IN LEFT FOOT  PROCEDURE:   Left arthroscopic ankle arthrodesis, bone graft, removal of hardware  SURGEON:  Surgeon(s) and Role:    * Aashna Matson, Stephan Minister, DPM - Primary    * Edrick Kins, DPM  PHYSICIAN ASSISTANT:   ASSISTANTS: Daylene Katayama DPM   ANESTHESIA:   regional and general  EBL:  50cc   BLOOD ADMINISTERED:none  DRAINS: none   LOCAL MEDICATIONS USED:  NONE  SPECIMEN:  No Specimen  DISPOSITION OF SPECIMEN:  N/A  COUNTS:  YES  TOURNIQUET:   Total Tourniquet Time Documented: Thigh (Left) - 120 minutes Total: Thigh (Left) - 120 minutes   DICTATION: .Note written in EPIC  PLAN OF CARE: Admit to inpatient   PATIENT DISPOSITION:  PACU - hemodynamically stable.   Delay start of Pharmacological VTE agent (>24hrs) due to surgical blood loss or risk of bleeding: yes; Resume Eliquis Sunday

## 2022-03-03 NOTE — Assessment & Plan Note (Addendum)
-   Status post left arthroscopic ankle arthrodesis, bone graft, removal of hardware - TOC, PT, OT-recommending SNF - Pain control per podiatry: Oxycodone 5 mg p.o. every 4 hours as needed for moderate pain, oxycodone 10 mg every 4 hours as needed for severe pain - Nonweightbearing on the left side

## 2022-03-03 NOTE — Assessment & Plan Note (Addendum)
-   Resumed home metformin 1000 mg p.o. twice daily - Holding home glipizide and Actos - Insulin SSI with at bedtime coverage ordered - Goal inpatient blood glucose level is 140-180

## 2022-03-03 NOTE — Anesthesia Preprocedure Evaluation (Addendum)
Anesthesia Evaluation  Patient identified by MRN, date of birth, ID band Patient awake    Reviewed: Allergy & Precautions, NPO status , Patient's Chart, lab work & pertinent test results  History of Anesthesia Complications (+) PONV and history of anesthetic complications  Airway Mallampati: I  TM Distance: >3 FB Neck ROM: full    Dental  (+) Missing,    Pulmonary shortness of breath and with exertion, asthma , sleep apnea ([facility]) , COPD,    Pulmonary exam normal        Cardiovascular Exercise Tolerance: Poor hypertension, Pt. on medications pulmonary hypertension+ CAD, +CHF and + DOE  + dysrhythmias (RBBB, mobitz type 1) Atrial Fibrillation  Rhythm:Regular Rate:Normal  cCTA 11/01/2021: Ca+ score 174 (67th percentile for age/sex match) --> FFR demonstrated no hemodynamically significant stenosis  Long term current use of anticoagulant  11/22: ECHO 1. Left ventricular ejection fraction, by estimation, is 60 to 65%. The  left ventricle has normal function. The left ventricle has no regional  wall motion abnormalities. Left ventricular diastolic parameters were  normal.  2. Right ventricular systolic function is normal. The right ventricular  size is normal.  3. The mitral valve is normal in structure. Trivial mitral valve  regurgitation. No evidence of mitral stenosis.  4. The aortic valve is normal in structure. Aortic valve regurgitation is  not visualized. No aortic stenosis is present.  5. 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 ), No Residual Symptoms negative psych ROS   GI/Hepatic Neg liver ROS, hiatal hernia, S/p NISSEN FUNDOPLICATION   Endo/Other  diabetes, Well Controlled, Type 2, Oral Hypoglycemic AgentsHypothyroidism   Renal/GU      Musculoskeletal  (+)  Arthritis ,   Abdominal (+) + obese,   Peds  Hematology negative hematology ROS (+)   Anesthesia Other Findings Past Medical History: No date: Anginal pain ([place]) No date: Aortic atherosclerosis (HCC) No date: Arthritis No date: Asthma No date: Atrial fibrillation (HCC)     Comment:  a.) CHA2DS2-VASc = 7 (age x2, sex, CHF, HTN, vascular               disease history, T2DM);  b.) rate/rhythm maintained               without pharmacological intervention; chronically               anticoagulated using standard dose apixaban No date: Bilateral lower extremity edema No date: CHF (congestive heart failure) ([place]) No date: Complication of anesthesia     Comment:  a.) PONV No date: COPD (chronic obstructive pulmonary disease) ([place]) No date: Coronary artery disease     Comment:  a.) cCTA 11/01/2021: Ca+ score 174 (67th percentile for               age/sex match) --> FFR demonstrated no hemodynamically               significant stenosis No date: Depression No date: DOE (dyspnea on exertion) No date: GERD (gastroesophageal reflux disease) No date: Gout No date: Heart murmur 05/12/2019: History of 2019 novel coronavirus disease (COVID-19) No date: History of hiatal hernia No date: History of kidney stones No date: History of orthopnea No date: HLD (hyperlipidemia) No date: Hypertension No date: Hypothyroidism No date: Long term current use of anticoagulant     Comment:  a.) apixaban No date: Migraines  No date: Mobitz type I Wenckebach atrioventricular block     Comment:  a.) holter study 01/27/2022 No date: Palpitations No date: PONV (postoperative nausea and vomiting) 01/27/2022: PSVT (paroxysmal supraventricular tachycardia) (HCC)     Comment:  a.) holter study 01/27/2022 - longest lasting 17 beats               at a rate of 109 bpm No date: Pulmonary HTN (HCC) No date: RBBB (right bundle branch block) No date: Scleroderma ([place])     Comment:  hands No date: Sleep apnea      Comment:  a.) does not require nocturnal PAP therapy No date: T2DM (type 2 diabetes mellitus) ([place]) No date: Vertigo No date: Vitamin D deficiency  Past Surgical History: No date: ABDOMINAL HYSTERECTOMY     Comment:  ovaries intact No date: BLADDER SURGERY     Comment:  bladder tuck 03/20/2016: BREAST BIOPSY; Right     Comment:  neg x 2 area No date: CARDIAC CATHETERIZATION No date: CARPAL TUNNEL RELEASE 09/17/2018: CATARACT EXTRACTION W/PHACO; Left     Comment:  Procedure: CATARACT EXTRACTION PHACO AND INTRAOCULAR               LENS PLACEMENT (IOC) LEFT, DIABETIC;  Surgeon: Birder Robson, MD;  Location: [facility] ORS;  Service:               Ophthalmology;  Laterality: Left;  [place] 00:34 CDE               4.85 Fluid pack lot # [id number] 10/15/2018: CATARACT EXTRACTION W/PHACO; Right     Comment:  Procedure: CATARACT EXTRACTION PHACO AND INTRAOCULAR               LENS PLACEMENT (IOC)-RIGHT;  Surgeon: Birder Robson,               MD;  Location: [facility] ORS;  Service: Ophthalmology;                Laterality: Right;  [place] 00:27.6 CDE 3.43 Fluid Pack Lot               # [id number] 09/27/2018: COLONOSCOPY WITH PROPOFOL; N/A     Comment:  Procedure: COLONOSCOPY WITH PROPOFOL;  Surgeon:               Virgel Manifold, MD;  Location: [facility];                Service: Endoscopy;  Laterality: N/A; No date: DILATION AND CURETTAGE OF UTERUS No date: EYE SURGERY     Comment:  eyelid No date: FRACTURE SURGERY     Comment:  left ankle-plate and screws palced No date: LITHOTRIPSY No date: NISSEN FUNDOPLICATION No date: SHOULDER SURGERY; Right     Comment:  rotator cuff No date: TONSILLECTOMY     Reproductive/Obstetrics negative OB ROS                           Anesthesia Physical Anesthesia Plan  ASA: 3  Anesthesia Plan: General   Post-op Pain Management: Regional block*   Induction: Intravenous and Rapid sequence  PONV Risk Score  and Plan: 4 or greater and Dexamethasone, Ondansetron, Treatment may vary due to age or medical condition and Droperidol  Airway Management Planned: Oral ETT  Additional Equipment:   Intra-op Plan:   Post-operative Plan: Extubation in OR  Informed Consent: I have reviewed the patients History and Physical, chart, labs and discussed the procedure including the risks, benefits and alternatives for the proposed anesthesia with the patient or authorized representative who has indicated his/her understanding and acceptance.     Dental Advisory Given  Plan Discussed with: Anesthesiologist, CRNA and Surgeon  Anesthesia Plan Comments: (Semaglutide use this past Sunday. NPO appropriate. Denies vomiting or reflux. Gastric ultrasound empty. Pt and surgeon aware of risks of aspiration and patient desires to continue with surgery today. Will RSI and decompress stomach. )      Anesthesia Quick Evaluation

## 2022-03-03 NOTE — Anesthesia Procedure Notes (Signed)
Anesthesia Regional Block: Popliteal block   Pre-Anesthetic Checklist: , timeout performed,  Correct Patient, Correct Site, Correct Laterality,  Correct Procedure, Correct Position, site marked,  Risks and benefits discussed,  Surgical consent,  Pre-op evaluation,  At surgeon's request and post-op pain management  Laterality: Left and Lower  Prep: chloraprep       Needles:  Injection technique: Single-shot  Needle Type: Stimiplex     Needle Length: 9cm  Needle Gauge: 22     Additional Needles:   Procedures:,,,, ultrasound used (permanent image in chart),,    Narrative:  Start time: 03/03/2022 10:00 AM End time: 03/03/2022 10:05 AM Injection made incrementally with aspirations every 5 mL.  Performed by: Personally  Anesthesiologist: Iran Ouch, MD  Additional Notes: Patient consented for risk and benefits of nerve block including but not limited to nerve damage, failed block, bleeding and infection.  Patient voiced understanding.  Functioning IV was confirmed and monitors were applied.  Timeout done prior to procedure and prior to any sedation being given to the patient.  Patient confirmed procedure site prior to any sedation given to the patient. Sterile prep,hand hygiene and sterile gloves were used.  Minimal sedation used for procedure.  No paresthesia endorsed by patient during the procedure.  Negative aspiration and negative test dose prior to incremental administration of local anesthetic. The patient tolerated the procedure well with no immediate complications.

## 2022-03-03 NOTE — Anesthesia Procedure Notes (Signed)
Anesthesia Regional Block: Adductor canal block   Pre-Anesthetic Checklist: , timeout performed,  Correct Patient, Correct Site, Correct Laterality,  Correct Procedure, Correct Position, site marked,  Risks and benefits discussed,  Surgical consent,  Pre-op evaluation,  At surgeon's request and post-op pain management  Laterality: Left  Prep: chloraprep       Needles:  Injection technique: Single-shot  Needle Type: Stimiplex     Needle Length: 9cm  Needle Gauge: 22     Additional Needles:   Procedures:,,,, ultrasound used (permanent image in chart),,    Narrative:  Start time: 03/03/2022 10:15 AM End time: 03/03/2022 10:20 AM Injection made incrementally with aspirations every 5 mL.  Performed by: Personally  Anesthesiologist: Iran Ouch, MD  Additional Notes: Patient consented for risk and benefits of nerve block including but not limited to nerve damage, failed block, bleeding and infection.  Patient voiced understanding.  Functioning IV was confirmed and monitors were applied.  Timeout done prior to procedure and prior to any sedation being given to the patient.  Patient confirmed procedure site prior to any sedation given to the patient. Sterile prep,hand hygiene and sterile gloves were used.  Minimal sedation used for procedure.  No paresthesia endorsed by patient during the procedure.  Negative aspiration and negative test dose prior to incremental administration of local anesthetic. The patient tolerated the procedure well with no immediate complications.

## 2022-03-04 ENCOUNTER — Encounter: Payer: Self-pay | Admitting: Podiatry

## 2022-03-04 DIAGNOSIS — T8484XA Pain due to internal orthopedic prosthetic devices, implants and grafts, initial encounter: Secondary | ICD-10-CM | POA: Diagnosis not present

## 2022-03-04 DIAGNOSIS — M19172 Post-traumatic osteoarthritis, left ankle and foot: Secondary | ICD-10-CM | POA: Diagnosis not present

## 2022-03-04 DIAGNOSIS — M8970 Major osseous defect, unspecified site: Secondary | ICD-10-CM | POA: Diagnosis not present

## 2022-03-04 DIAGNOSIS — Z981 Arthrodesis status: Secondary | ICD-10-CM | POA: Diagnosis not present

## 2022-03-04 LAB — CBC
HCT: 40.7 % (ref 36.0–46.0)
Hemoglobin: 13.1 g/dL (ref 12.0–15.0)
MCH: 30 pg (ref 26.0–34.0)
MCHC: 32.2 g/dL (ref 30.0–36.0)
MCV: 93.3 fL (ref 80.0–100.0)
Platelets: 218 10*3/uL (ref 150–400)
RBC: 4.36 MIL/uL (ref 3.87–5.11)
RDW: 15.3 % (ref 11.5–15.5)
WBC: 11.8 10*3/uL — ABNORMAL HIGH (ref 4.0–10.5)
nRBC: 0 % (ref 0.0–0.2)

## 2022-03-04 LAB — BASIC METABOLIC PANEL
Anion gap: 7 (ref 5–15)
BUN: 16 mg/dL (ref 8–23)
CO2: 24 mmol/L (ref 22–32)
Calcium: 8.9 mg/dL (ref 8.9–10.3)
Chloride: 108 mmol/L (ref 98–111)
Creatinine, Ser: 0.76 mg/dL (ref 0.44–1.00)
GFR, Estimated: >60 mL/min (ref >60)
Glucose, Bld: 215 mg/dL — ABNORMAL HIGH (ref 70–99)
Potassium: 4.2 mmol/L (ref 3.5–5.1)
Sodium: 139 mmol/L (ref 135–145)

## 2022-03-04 LAB — GLUCOSE, CAPILLARY
Glucose-Capillary: 174 mg/dL — ABNORMAL HIGH (ref 70–99)
Glucose-Capillary: 168 mg/dL — ABNORMAL HIGH (ref 70–99)
Glucose-Capillary: 145 mg/dL — ABNORMAL HIGH (ref 70–99)
Glucose-Capillary: 140 mg/dL — ABNORMAL HIGH (ref 70–99)

## 2022-03-04 LAB — MAGNESIUM: Magnesium: 1.9 mg/dL (ref 1.7–2.4)

## 2022-03-04 NOTE — Op Note (Signed)
Patient Name: Teresa [name] DOB: [birth year]  MRN: [id number]   Date of Service: 03/03/2022  Surgeon: Dr. Lanae Crumbly, DPM Assistants: None Pre-operative Diagnosis:  Post traumatic arthritis left ankle Painful retained hardware Post-operative Diagnosis:  Post traumatic arthritis left ankle Painful retained hardware Major osseous defect Procedures:  1) Arthroscopic ankle arthrodesis left ankle  2) Bone graft minor   3) Removal of retained implant deep Pathology/Specimens: * No specimens in log * Anesthesia: General with regional block Hemostasis:  Total Tourniquet Time Documented: Thigh (Left) - 120 minutes Total: Thigh (Left) - 120 minutes  Estimated Blood Loss: 10 mL Materials:  Implant Name Type Inv. Item Serial No. Manufacturer Lot No. LRB No. Used Action  PUTTY DBM ALLOSYNC PURE 10CC - [id number] Putty PUTTY DBM ALLOSYNC PURE 10CC [id number] [employer]  Left 1 Implanted  SCREW COMPR FT 7X65 - [id number] Screw SCREW COMPR FT 7X65  [employer]  Left 1 Implanted  SCREW COMPR FT 7X40 - [id number] Screw SCREW COMPR FT 7X40  [employer]  Left 1 Implanted  SCREW COMPR FT 7X50 - [id number] Screw SCREW COMPR FT 7X50  [employer]  Left 1 Implanted   Medications: none Complications: none  Indications for Procedure:  This is a 76 y.o. female with a history of previous ankle fracture with retained painful hardware and post traumatic ankle arthritis.  After failing nonoperative treatment, she elected for operative intervention.  All risk benefits and potential complications were discussed prior to surgery.  Informed consent was signed and reviewed.   Procedure in Detail: Patient was identified in pre-operative holding area. Formal consent was signed and the left lower extremity was marked. Patient was brought back to the operating room. Anesthesia was induced. The extremity was prepped and draped in the usual sterile fashion. Timeout was taken to confirm patient name, laterality,  and procedure prior to incision.   Attention was then directed to the left lower extremity where a small incision was made over the proximal tibial tubercle.  The aspiration needle was inserted into the tibial canal.  50 cc of whole blood bone marrow aspirate was harvested and centrifuged for bone marrow aspirate concentrate.  This incision was closed with nylon.  An incision was made over the medial malleolus and palpable painful screw sites. Blunt dissection was used to expose the screw heads. The screws were removed uneventfully. It was irrigated and closed with nylon.  I directed my attention to the medial ankle where 20 cc saline was injected into the ankle joint and a small incision was made just medial to the tibialis anterior tendon.  The blunt trocar and cannula was inserted into the ankle joint.  Significant arthritis and synovitis was noted in the joint this was debrided using a shaver.  The camera was passed laterally and a standard lateral portal was made.  The shaver was introduced through this.  The distractor was then applied to distract the joint.  Little cartilage remained.  I then utilized the shaver, curettes, osteotomes, and power bur to denude the tibial plafond and talar dome of cartilage and subchondral bone.  Once this with complete the debris was removed from the joint and it was thoroughly irrigated.  The articular surfaces were then penetrated with with an angled pick and osteotome in a fish scale fashion.  The joint was then dried with suction  I then directed my attention to the lateral heel where a small incision was made and the bone graft reamer was used to harvest 2  cc of autogenous bone graft.  This was mixed with the bone marrow aspirate concentrate and Allosync DBM.  This created a flowable bone graft that was injected into the joint under live visualization.  Following this the ankle joint was then positioned into the appropriate position for arthrodesis.  It was  temporarily fixated with a K wire.  Guidewires for the 7.0 mm fully threaded cannulated screws were then placed in a standard tripod fashion.  Once fluoroscopy confirmed good position of all screws and fusion surfaces the screws were sequentially drilled and inserted.  Good compression across the fusion surface was noted.  All wounds were then thoroughly irrigated and closed with 3-0 nylon.  The foot was then dressed with Xeroform, dry sterile dressings and a well-padded below the knee posterior splint with saddle splint. Patient tolerated the procedure well.   Disposition: Following a period of post-operative monitoring, patient will be transferred to the inpatient unit.

## 2022-03-04 NOTE — Plan of Care (Signed)
  Problem: Education: Goal: Ability to describe self-care measures that may prevent or decrease complications (Diabetes Survival Skills Education) will improve Outcome: Progressing Goal: Individualized Educational Video(s) Outcome: Progressing   Problem: Coping: Goal: Ability to adjust to condition or change in health will improve Outcome: Progressing   Problem: Fluid Volume: Goal: Ability to maintain a balanced intake and output will improve Outcome: Progressing   Problem: Health Behavior/Discharge Planning: Goal: Ability to identify and utilize available resources and services will improve Outcome: Progressing Goal: Ability to manage health-related needs will improve Outcome: Progressing   Problem: Metabolic: Goal: Ability to maintain appropriate glucose levels will improve Outcome: Progressing   Problem: Nutritional: Goal: Maintenance of adequate nutrition will improve Outcome: Progressing Goal: Progress toward achieving an optimal weight will improve Outcome: Progressing   Problem: Skin Integrity: Goal: Risk for impaired skin integrity will decrease Outcome: Progressing   Problem: Tissue Perfusion: Goal: Adequacy of tissue perfusion will improve Outcome: Progressing   Problem: Education: Goal: Knowledge of General Education information will improve Description: Including pain rating scale, medication(s)/side effects and non-pharmacologic comfort measures Outcome: Progressing   Problem: Health Behavior/Discharge Planning: Goal: Ability to manage health-related needs will improve Outcome: Progressing   Problem: Clinical Measurements: Goal: Ability to maintain clinical measurements within normal limits will improve Outcome: Progressing Goal: Will remain free from infection Outcome: Progressing Goal: Diagnostic test results will improve Outcome: Progressing Goal: Respiratory complications will improve Outcome: Progressing Goal: Cardiovascular complication will  be avoided Outcome: Progressing   Problem: Activity: Goal: Risk for activity intolerance will decrease Outcome: Progressing   Problem: Nutrition: Goal: Adequate nutrition will be maintained Outcome: Progressing   Problem: Coping: Goal: Level of anxiety will decrease Outcome: Progressing   Problem: Elimination: Goal: Will not experience complications related to bowel motility Outcome: Progressing Goal: Will not experience complications related to urinary retention Outcome: Progressing   Problem: Pain Managment: Goal: General experience of comfort will improve Outcome: Progressing   Problem: Safety: Goal: Ability to remain free from injury will improve Outcome: Progressing   Problem: Skin Integrity: Goal: Risk for impaired skin integrity will decrease Outcome: Progressing   Problem: Education: Goal: Required Educational Video(s) Outcome: Progressing   Problem: Clinical Measurements: Goal: Ability to maintain clinical measurements within normal limits will improve Outcome: Progressing Goal: Postoperative complications will be avoided or minimized Outcome: Progressing   Problem: Skin Integrity: Goal: Demonstration of wound healing without infection will improve Outcome: Progressing   

## 2022-03-04 NOTE — Evaluation (Signed)
Occupational Therapy Evaluation Patient Details Name: Teresa [name] MRN: [id number] DOB: [birth year] Today's Date: 03/04/2022   History of Present Illness Ms. Maryse Brierley is a 76 year old female with history of gout, hypothyroid, hyperlipidemia, non-insulin-dependent diabetes mellitus, osteoarthritis, atrial fibrillation, who was taken to the operating room by podiatry for chief concerns of osteomyelitis and underwent ankle transfusion. Underwent Left arthroscopic ankle arthrodesis, bone graft, removal of hardware with podiatry. 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   Clinical Impression   Chart reviewed, RN cleared pt for participation in OT evaluation. Pt is alert and oriented x4. PTA pt reports MOD I-I with ADL however increased time required due to LLE pain, grandson assists with IADLs as needed. Pt presents with deficits in strength, endurance, activity tolerance affecting safe and optimal ADL completion. Supine>sit completed with supervision, short amb transfer from bed>chair with frequent tactile/verbal cues for NWBing, STS 2 attempts with CGA, amb approx 6' with RW with chair follow with CGA with no verbal/tactile cures required for NWBing LLE. Pt then reports SOB, spo2 >90%. Pt performs UB bathing with SET UP, LB bathing/peri care with MIN A for thoroughness. Pt is performing ADL below PLOF, would benefit from STR to address deficits. Pt is left in bedside chair, NAD, all needs met. OT will follow acutely.    Recommendations for follow up therapy are one component of a multi-disciplinary discharge planning process, led by the attending physician.  Recommendations may be updated based on patient status, additional functional criteria and insurance authorization.   Follow Up Recommendations  Skilled nursing-short term rehab (<3 hours/day)    Assistance Recommended at Discharge Intermittent Supervision/Assistance  Patient can return home with the following A lot of help with walking and/or transfers;A little  help with bathing/dressing/bathroom    Functional Status Assessment  Patient has had a recent decline in their functional status and demonstrates the ability to make significant improvements in function in a reasonable and predictable amount of time.  Equipment Recommendations  Other (comment) (per next venue of care)    Recommendations for Other Services       Precautions / Restrictions Precautions Precautions: Fall Restrictions Weight Bearing Restrictions: Yes LLE Weight Bearing: Non weight bearing      Mobility Bed Mobility Overal bed mobility: Needs Assistance Bed Mobility: Supine to Sit     Supine to sit: Supervision, HOB elevated          Transfers Overall transfer level: Needs assistance   Transfers: Sit to/from Stand Sit to Stand: Min guard, Min assist                  Balance Overall balance assessment: Needs assistance Sitting-balance support: Feet supported Sitting balance-Leahy Scale: Good     Standing balance support: During functional activity, Bilateral upper extremity supported Standing balance-Leahy Scale: Poor                             ADL either performed or assessed with clinical judgement   ADL Overall ADL's : Needs assistance/impaired Eating/Feeding: Set up;Sitting   Grooming: Wash/dry hands;Wash/dry face;Sitting;Set up   Upper Body Bathing: Set up;Sitting   Lower Body Bathing: Minimal assistance;Sit to/from stand   Upper Body Dressing : Minimal assistance;Sitting Upper Body Dressing Details (indicate cue type and reason): gown     Toilet Transfer: Min guard;Minimal assistance;Stand-pivot;BSC/3in1   Toileting- Clothing Manipulation and Hygiene: Sit to/from stand;Moderate assistance       Functional mobility during ADLs: Min  guard (approx 6' with RW)       Vision Patient Visual Report: No change from baseline       Perception     Praxis      Pertinent Vitals/Pain Pain Assessment Pain  Assessment: 0-10 Pain Score: 4  Pain Location: LLE Pain Descriptors / Indicators: Grimacing, Aching Pain Intervention(s): Limited activity within patient's tolerance, Monitored during session, Repositioned, Ice applied     Hand Dominance     Extremity/Trunk Assessment Upper Extremity Assessment Upper Extremity Assessment: Overall WFL for tasks assessed   Lower Extremity Assessment Lower Extremity Assessment: Generalized weakness       Communication     Cognition Arousal/Alertness: Awake/alert Behavior During Therapy: WFL for tasks assessed/performed Overall Cognitive Status: Within Functional Limits for tasks assessed                                       General Comments       Exercises Other Exercises Other Exercises: edu re: role of OT, role of rehab, discharge recommendations, home safety, falls prevention, DME use   Shoulder Instructions      Home Living Family/patient expects to be discharged to:: Skilled nursing facility                                        Prior Functioning/Environment                          OT Problem List: Decreased activity tolerance;Decreased strength;Impaired balance (sitting and/or standing);Decreased knowledge of use of DME or AE      OT Treatment/Interventions: Self-care/ADL training;Patient/family education;Therapeutic exercise;Energy conservation;Balance training;Therapeutic activities;DME and/or AE instruction    OT Goals(Current goals can be found in the care plan section) Acute Rehab OT Goals Patient Stated Goal: go to rehab OT Goal Formulation: With patient Time For Goal Achievement: 03/18/22 Potential to Achieve Goals: Good ADL Goals Pt Will Perform Lower Body Dressing: with modified independence;sit to/from stand Pt Will Transfer to Toilet: with modified independence Pt Will Perform Toileting - Clothing Manipulation and hygiene: with modified independence;sit to/from stand   OT Frequency: Min 2X/week    Co-evaluation              AM-PAC OT "6 Clicks" Daily Activity     Outcome Measure Help from another person eating meals?: None Help from another person taking care of personal grooming?: None Help from another person toileting, which includes using toliet, bedpan, or urinal?: A Little Help from another person bathing (including washing, rinsing, drying)?: A Little Help from another person to put on and taking off regular upper body clothing?: A Little Help from another person to put on and taking off regular lower body clothing?: A Lot 6 Click Score: 19   End of Session Equipment Utilized During Treatment: Rolling walker (2 wheels) Nurse Communication: Mobility status  Activity Tolerance: Patient tolerated treatment well Patient left: in chair;with call bell/phone within reach  OT Visit Diagnosis: Unsteadiness on feet (R26.81);History of falling (Z91.81)                Time: 1937-9024 OT Time Calculation (min): 25 min Charges:  OT General Charges $OT Visit: 1 Visit OT Evaluation $OT Eval Moderate Complexity: 1 Mod  Shanon Payor, OTD OTR/L  03/04/22, 1:34 PM

## 2022-03-04 NOTE — Progress Notes (Signed)
  Subjective:  Patient ID: Teresa [name], female    DOB: 09/12/1945,  MRN: [id number]  POD 1 status post left ankle arthrodesis  Overall doing well the nerve block still in effect she cannot feel her toes or wiggle them  Negative for chest pain and shortness of breath Fever: no Night sweats: no Constitutional signs: no Objective:   Vitals:   03/04/22 0813 03/04/22 1525  BP: 128/62 (!) 111/58  Pulse: (!) 57 63  Resp:    Temp: 97.9 F (36.6 C) 98.2 F (36.8 C)  SpO2: 100% 99%   General AA&O x3. Normal mood and affect.  Vascular Foot is warm and well-perfused Brisk capillary refill to all digits.   Neurologic Epicritic sensation grossly absent secondary to block.  Dermatologic Dressing and splint clean dry intact  Orthopedic: Not having any pain, unable to move toes secondary to block    Assessment & Plan:  Patient was evaluated and treated and all questions answered.  POD #1 status post left ankle arthrodesis -Overall doing well.  I advised her that the block may be in effect for another day or 2 -NWB LLE in splint -Continue elevating LLE.  Ice behind knee -PT evaluated today.  Expect she will go to SAR, I think this will be best for her to be able to maintain nonweightbearing and reduce fall risk -Eliquis to restart tomorrow, this will be sufficient for DVT prophylaxis as well -We will continue to follow   Criselda Peaches, DPM  Accessible via secure chat for questions or concerns.

## 2022-03-04 NOTE — Assessment & Plan Note (Signed)
Blood pressure within goal. -Continue home Lasix

## 2022-03-04 NOTE — NC FL2 (Signed)
[place] LEVEL OF CARE SCREENING TOOL     IDENTIFICATION  Patient Name: Teresa [name] Birthdate: [birth year] Sex: female Admission Date (Current Location): 03/03/2022  [place] and [place] Number:  Engineering geologist and Address:  [facility], [address] [place], [place] [address]      Provider Number: [id number]  Attending Physician Name and Address:  Lorella Nimrod, MD  Relative Name and Phone Number:  Laronica Bhagat [phone]    Current Level of Care: Hospital Recommended Level of Care: [place] Prior Approval Number:    Date Approved/Denied: 06/15/21 PASRR Number: [id number]  Discharge Plan: SNF    Current Diagnoses: Patient Active Problem List   Diagnosis Date Noted   Status post ankle arthrodesis 03/03/2022   Osteopenia of multiple sites 12/12/2021   Atrial fibrillation ([place]) 08/09/2021   Hypokalemia 06/13/2021   Chronic diastolic CHF (congestive heart failure) ([place]) 05/16/2019   Encounter for screening colonoscopy    Benign neoplasm of cecum    Diverticulosis of large intestine without diverticulitis    Polyneuropathy associated with underlying disease (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   Essential hypertension 06/21/2009   CAD (coronary artery disease) 06/21/2009   DYSPHAGIA 06/21/2009   RUQ PAIN 06/21/2009   HIATAL HERNIA 01/07/2008   Hypothyroidism 11/22/2007   Anxiety state 11/22/2007   DEPRESSION 11/22/2007   Hypertensive heart disease without heart failure 11/22/2007   ANGINA PECTORIS 11/22/2007   RENAL CALCULUS 11/22/2007   Osteoarthritis 11/22/2007   Sleep apnea 11/22/2007   GASTRITIS 10/23/2007   DIVERTICULOSIS, COLON 10/23/2007   Type 2 diabetes mellitus with diabetic neuropathy, without long-term current use of insulin ([place])  07/16/2007   HYPERCHOLESTEROLEMIA 07/16/2007   OBESITY, MODERATE 07/16/2007   ISCHEMIC HEART DISEASE 07/16/2007   ESOPHAGEAL REFLUX 07/16/2007   SCLERODERMA 07/16/2007    Orientation RESPIRATION BLADDER Height & Weight     Self, Time, Situation, Place  Normal Continent Weight: 86.2 kg Height:  '5\' 2"'$  (157.5 cm)  BEHAVIORAL SYMPTOMS/MOOD NEUROLOGICAL BOWEL NUTRITION STATUS      Continent Diet  AMBULATORY STATUS COMMUNICATION OF NEEDS Skin   Limited Assist Verbally Normal                       Personal Care Assistance Level of Assistance  Dressing     Dressing Assistance: Limited assistance     Functional Limitations Info             SPECIAL CARE FACTORS FREQUENCY  PT (By licensed PT), OT (By licensed OT)     PT Frequency: 5 X weekly OT Frequency: 5 X weekly            Contractures Contractures Info: Present    Additional Factors Info                  Current Medications (03/04/2022):  This is the current hospital active medication list Current Facility-Administered Medications  Medication Dose Route Frequency Provider Last Rate Last Admin   acetaminophen (TYLENOL) tablet 650 mg  650 mg Oral Q6H PRN Cox, Amy N, DO       Or   acetaminophen (TYLENOL) suppository 650 mg  650 mg Rectal Q6H PRN Cox, Amy N, DO       albuterol (PROVENTIL) (2.5 MG/3ML) 0.083% nebulizer solution 2.5 mg  2.5 mg Inhalation Q6H  PRN Cox, Amy N, DO       allopurinol (ZYLOPRIM) tablet 100 mg  100 mg Oral Daily Cox, Amy N, DO   100 mg at 03/04/22 0857   [START ON 03/05/2022] apixaban (ELIQUIS) tablet 5 mg  5 mg Oral BID Cox, Amy N, DO       atorvastatin (LIPITOR) tablet 10 mg  10 mg Oral Daily Cox, Amy N, DO   10 mg at 03/04/22 6333   colchicine tablet 0.6 mg  0.6 mg Oral Daily PRN Cox, Amy N, DO       furosemide (LASIX) tablet 40 mg  40 mg Oral Daily Cox, Amy N, DO   40 mg at 03/04/22 0857   gabapentin (NEURONTIN) capsule 800 mg  800 mg Oral TID Cox, Amy N, DO   800 mg at 03/04/22  0856   heparin injection 5,000 Units  5,000 Units Subcutaneous Q8H Cox, Amy N, DO   5,000 Units at 03/04/22 1416   insulin aspart (novoLOG) injection 0-15 Units  0-15 Units Subcutaneous TID WC Cox, Amy N, DO   3 Units at 03/04/22 1215   insulin aspart (novoLOG) injection 0-5 Units  0-5 Units Subcutaneous QHS Cox, Amy N, DO       levothyroxine (SYNTHROID) tablet 88 mcg  88 mcg Oral Q0600 Cox, Amy N, DO   88 mcg at 03/04/22 5456   menthol-cetylpyridinium (CEPACOL) lozenge 3 mg  1 lozenge Oral PRN Sharion Settler, NP   3 mg at 03/03/22 2141   metFORMIN (GLUCOPHAGE) tablet 1,000 mg  1,000 mg Oral BID WC Cox, Amy N, DO   1,000 mg at 03/04/22 0857   metoprolol tartrate (LOPRESSOR) injection 5 mg  5 mg Intravenous Q2H PRN Cox, Amy N, DO       nystatin cream (MYCOSTATIN) 1 Application  1 Application Topical BID PRN Cox, Amy N, DO       omega-3 acid ethyl esters (LOVAZA) capsule 1 g  1 g Oral Daily Cox, Amy N, DO   1 g at 03/04/22 0857   ondansetron (ZOFRAN) tablet 4 mg  4 mg Oral Q6H PRN Cox, Amy N, DO       Or   ondansetron (ZOFRAN) injection 4 mg  4 mg Intravenous Q6H PRN Cox, Amy N, DO       oxyCODONE (Oxy IR/ROXICODONE) immediate release tablet 10 mg  10 mg Oral Q4H PRN Cox, Amy N, DO       oxyCODONE (Oxy IR/ROXICODONE) immediate release tablet 5 mg  5 mg Oral Q4H PRN Cox, Amy N, DO   5 mg at 03/04/22 0524   polyethylene glycol (MIRALAX / GLYCOLAX) packet 17 g  17 g Oral BID PRN Cox, Amy N, DO   17 g at 03/04/22 0858   senna-docusate (Senokot-S) tablet 1 tablet  1 tablet Oral QHS PRN Cox, Amy N, DO   1 tablet at 03/04/22 0524   [START ON 03/06/2022] Vitamin D (Ergocalciferol) (DRISDOL) 1.25 MG (50000 UNIT) capsule 50,000 Units  50,000 Units Oral Q7 days Cox, Amy N, DO         Discharge Medications: Please see discharge summary for a list of discharge medications.  Relevant Imaging Results:  Relevant Lab Results:   Additional Information ss#[id number]  Harriet Masson, RN

## 2022-03-04 NOTE — Progress Notes (Signed)
Progress Note   Patient: Teresa [name] NWG:[id number] DOB: 11/24/1945 DOA: 03/03/2022     1 DOS: the patient was seen and examined on 03/04/2022   Brief hospital course: [name] is a 76 year old female with history of gout, hypothyroid, hyperlipidemia, non-insulin-dependent diabetes mellitus, osteoarthritis, atrial fibrillation, who was taken to the operating room by podiatry for chief concerns of osteomyelitis and underwent ankle transfusion.  Initial vitals prior to procedure showed temperature of 97.3, respiration rate of 18, heart rate of 63, blood pressure 125/68, SPO2 of 96% on room air.  Podiatry service requesting hospital admission for placement.  BMP was ordered and serum sodium showed 141, potassium 3.3, chloride 108, bicarb 23, nonfasting blood glucose 184, BUN of 15, serum creatinine of 0.59, GFR greater than 60.  CBC with differential with unremarkable.  7/29: Pending PT/OT evaluation.  Patient most likely will need placement.  She will be nonweightbearing on left side.  Eliquis is being held until tomorrow, Sunday, 03/05/2022.   Assessment and Plan: * Status post ankle arthrodesis - Status post left arthroscopic ankle arthrodesis, bone graft, removal of hardware - TOC, PT, OT-recommending SNF - Pain control per podiatry: Oxycodone 5 mg p.o. every 4 hours as needed for moderate pain, oxycodone 10 mg every 4 hours as needed for severe pain - Nonweightbearing on the left side  Atrial fibrillation (HCC) - Holding home Eliquis 5 mg p.o. twice daily until Sunday, 03/05/2022 due to increased risk of bleeding postprocedure - Metoprolol 5 mg IV every 2 hours as needed for heart rate greater than 120, 4 doses ordered  Type 2 diabetes mellitus with diabetic neuropathy, without long-term current use of insulin (HCC) - Resumed home metformin 1000 mg p.o. twice daily - Holding home glipizide and Actos - Insulin SSI with at bedtime coverage ordered - Goal inpatient blood  glucose level is 140-180  Hypothyroidism - Continue home Synthroid  Hypokalemia Resolved.  Magnesium at 1.9 -Monitor and replete as needed  Essential hypertension Blood pressure within goal. -Continue home Lasix   Subjective: Patient was sitting comfortably in chair during morning rounds.  Left ankle still feels numb after the procedure, no pain.  Physical Exam: Vitals:   03/03/22 1535 03/03/22 2101 03/04/22 0520 03/04/22 0813  BP: 115/62 116/63 105/60 128/62  Pulse: 60 61 (!) 59 (!) 57  Resp: '18 16 16   '$ Temp: 97.6 F (36.4 C) 97.8 F (36.6 C) 97.7 F (36.5 C) 97.9 F (36.6 C)  TempSrc:  Oral Oral   SpO2: 94% 96% 99% 100%  Weight:      Height:       General.  Obese elderly lady, in no acute distress. Pulmonary.  Lungs clear bilaterally, normal respiratory effort. CV.  Regular rate and rhythm, no JVD, rub or murmur. Abdomen.  Soft, nontender, nondistended, BS positive. CNS.  Alert and oriented .  No focal neurologic deficit. Extremities.  No edema, no cyanosis, pulses intact and symmetrical.  Left ankle with Ace wrap. Psychiatry.  Judgment and insight appears normal.  Data Reviewed: Prior data reviewed.  Family Communication:   Disposition: Status is: Inpatient Remains inpatient appropriate because: Waiting for placement.   Planned Discharge Destination: Skilled nursing facility  DVT prophylaxis. Eliquis Time spent: 40 minutes  This record has been created using Systems analyst. Errors have been sought and corrected,but may not always be located. Such creation errors do not reflect on the standard of care.  Author: Lorella Nimrod, MD 03/04/2022 3:04 PM  For on call  review [email or web address].

## 2022-03-04 NOTE — Plan of Care (Signed)
  Problem: Coping: Goal: Ability to adjust to condition or change in health will improve Outcome: Progressing   Problem: Activity: Goal: Risk for activity intolerance will decrease Outcome: Progressing   Problem: Nutrition: Goal: Adequate nutrition will be maintained Outcome: Progressing   Problem: Pain Managment: Goal: General experience of comfort will improve Outcome: Progressing

## 2022-03-04 NOTE — Progress Notes (Signed)
MD notified about patients last BM. MD order to repeat Miralax.

## 2022-03-04 NOTE — TOC Initial Note (Signed)
Transition of Care [facility]) - Initial/Assessment Note    Patient Details  Name: Teresa [name] MRN: [id number] Date of Birth: 1946/01/12  [facility] CM/SW Contact:    Harriet Masson, RN Phone Number:[phone] 03/04/2022, 3:31 PM  Clinical Narrative:                 RN spoke with pt today concerning the recommendations for SNF. Pt receptive to any SNF facility and permitted TOC to proceed with  bedsearch. Pt lives alone in an apartment and has sufficient transportation and able to obtain all her medications. Pt has a rollator and knee scooter. Pt has decided that she wishes to transport to the facility via private vehicle due to the expense of the EMS. Pt indicated she will alert her son Chriss Czar) once bed offers obtain on the SNF discharge disposition.   Will began bedsearch for SNF today.  No other inquires or request at this time. TOC will continue to follow up accordingly for any additional needs.  Expected Discharge Plan: Skilled Nursing Facility Barriers to Discharge: Continued Medical Work up   Patient Goals and CMS Choice   CMS Medicare.gov Compare Post Acute Care list provided to:: Patient Choice offered to / list presented to : Patient  Expected Discharge Plan and Services Expected Discharge Plan: [place]   Discharge Planning Services: CM Consult   Living arrangements for the past 2 months: Apartment                                      Prior Living Arrangements/Services Living arrangements for the past 2 months: Apartment Lives with:: Self Patient language and need for interpreter reviewed:: Yes Do you feel safe going back to the place where you live?: Yes      Need for Family Participation in Patient Care: Yes (Comment) Care giver support system in place?: Yes (comment)   Criminal Activity/Legal Involvement Pertinent to Current Situation/Hospitalization: No - Comment as needed  Activities of Daily Living Home  Assistive Devices/Equipment: Eyeglasses, Environmental consultant (specify type) (Knee scooter: reading glasses) ADL Screening (condition at time of admission) Patient's cognitive ability adequate to safely complete daily activities?: Yes Is the patient deaf or have difficulty hearing?: No Does the patient have difficulty seeing, even when wearing glasses/contacts?: No Does the patient have difficulty concentrating, remembering, or making decisions?: No Patient able to express need for assistance with ADLs?: Yes Does the patient have difficulty dressing or bathing?: No Independently performs ADLs?: Yes (appropriate for developmental age) Does the patient have difficulty walking or climbing stairs?: Yes Weakness of Legs: Left Weakness of Arms/Hands: None  Permission Sought/Granted   Permission granted to share information with : Yes, Verbal Permission Granted              Emotional Assessment Appearance:: Appears stated age Attitude/Demeanor/Rapport: Engaged Affect (typically observed): Accepting Orientation: : Oriented to Self, Oriented to Place, Oriented to  Time, Oriented to Situation Alcohol / Substance Use: Not Applicable Psych Involvement: No (comment)  Admission diagnosis:  Status post ankle arthrodesis [Z98.1] Patient Active Problem List   Diagnosis Date Noted   Status post ankle arthrodesis 03/03/2022   Osteopenia of multiple sites 12/12/2021   Atrial fibrillation ([place]) 08/09/2021   Hypokalemia 06/13/2021   Chronic diastolic CHF (congestive heart failure) ([place]) 05/16/2019   Encounter for screening colonoscopy    Benign neoplasm of cecum    Diverticulosis of  large intestine without diverticulitis    Polyneuropathy associated with underlying disease ([place]) 12/11/2016   Microcalcifications of the breast 12/15/2015   Back pain, chronic 06/28/2015   Chronic airway obstruction ([place]) 06/28/2015   Panlobular emphysema ([place]) 09/10/2014   Essential hypertension 06/21/2009   CAD (coronary  artery disease) 06/21/2009   DYSPHAGIA 06/21/2009   RUQ PAIN 06/21/2009   HIATAL HERNIA 01/07/2008   Hypothyroidism 11/22/2007   Anxiety state 11/22/2007   DEPRESSION 11/22/2007   Hypertensive heart disease without heart failure 11/22/2007   ANGINA PECTORIS 11/22/2007   RENAL CALCULUS 11/22/2007   Osteoarthritis 11/22/2007   Sleep apnea 11/22/2007   GASTRITIS 10/23/2007   DIVERTICULOSIS, COLON 10/23/2007   Type 2 diabetes mellitus with diabetic neuropathy, without long-term current use of insulin ([place]) 07/16/2007   HYPERCHOLESTEROLEMIA 07/16/2007   OBESITY, MODERATE 07/16/2007   ISCHEMIC HEART DISEASE 07/16/2007   ESOPHAGEAL REFLUX 07/16/2007   SCLERODERMA 07/16/2007   PCP:  [name], MD Pharmacy:   [facility] - [place], [place] - [address] [place] [address] Phone: [phone] Fax: [phone]     Social Determinants of Health (SDOH) Interventions    Readmission Risk Interventions     No data to display

## 2022-03-04 NOTE — Assessment & Plan Note (Signed)
-   Continue home Synthroid °

## 2022-03-04 NOTE — Evaluation (Signed)
Physical Therapy Evaluation Patient Details Name: Teresa Teresa [name] MRN: [id number] DOB: July 11, 1946 Today's Date: 03/04/2022  History of Present Illness  Ms. Teresa Teresa [name] is a 76 year old female with history of gout, hypothyroid, hyperlipidemia, non-insulin-dependent diabetes mellitus, osteoarthritis, atrial fibrillation, who was taken to the operating room by podiatry for chief concerns of osteomyelitis and underwent ankle transfusion. Underwent Left arthroscopic ankle arthrodesis, bone graft, removal of hardware with podiatry. 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  Clinical Impression  Pt admitted with above diagnosis. Pt currently with functional limitations due to the deficits listed below (see "PT Problem List"). Upon entry, pt in recliner, awake and agreeable to participate. The pt is alert, pleasant, interactive, and able to provide info regarding prior level of function, both in tolerance and independence. Pt maintained NWB well, transfers with good form and mod effort, but AMB is very limited to long-covid related exertional limits and she struggles to safely cover limited household-distances. Pt reports regular falls prior to admission, hence a ST rehab stay would allow for maximizing pt's capacity to NWB hop-to with RW prior to DC to home. Patient's performance this date reveals decreased ability, independence, and tolerance in performing all basic mobility required for performance of activities of daily living. Pt requires additional DME, close physical assistance, and cues for safe participate in mobility. Pt will benefit from skilled PT intervention to increase independence and safety with basic mobility in preparation for discharge to the venue listed below.     No data found.        Recommendations for follow up therapy are one component of a multi-disciplinary discharge planning process, led by the attending physician.  Recommendations may be updated based on patient status, additional functional  criteria and insurance authorization.  Follow Up Recommendations Skilled nursing-short term rehab (<3 hours/day) Can patient physically be transported by private vehicle: Yes    Assistance Recommended at Discharge Intermittent Supervision/Assistance  Patient can return home with the following  A little help with walking and/or transfers;Assistance with cooking/housework;Help with stairs or ramp for entrance;Assist for transportation;Direct supervision/assist for financial management    Equipment Recommendations Rolling walker (2 wheels)  Recommendations for Other Services       Functional Status Assessment Patient has had a recent decline in their functional status and demonstrates the ability to make significant improvements in function in a reasonable and predictable amount of time.     Precautions / Restrictions Precautions Precautions: Fall Restrictions Weight Bearing Restrictions: Yes LLE Weight Bearing: Non weight bearing      Mobility  Bed Mobility                    Transfers Overall transfer level: Needs assistance Equipment used: Rolling walker (2 wheels) Transfers: Sit to/from Stand             General transfer comment: excellent form for STS Teresa [name] RW and LLE NWB    Ambulation/Gait Ambulation/Gait assistance: Min guard Gait Distance (Feet): 16 Feet Assistive device: Rolling walker (2 wheels) Gait Pattern/deviations: Step-to pattern       General Gait Details: NWB LLE< hop-to gait, shod RLE; requires 3 standing recovery intervals due to DOE, bradycardic at end at AMB  Stairs            Wheelchair Mobility    Modified Rankin (Stroke Patients Only)       Balance  Pertinent Vitals/Pain      Home Living Family/patient expects to be discharged to:: Skilled nursing facility                        Prior Function                       Hand Dominance         Extremity/Trunk Assessment   Upper Extremity Assessment Upper Extremity Assessment: Overall WFL for tasks assessed    Lower Extremity Assessment Lower Extremity Assessment: Generalized weakness       Communication      Cognition                                                General Comments      Exercises     Assessment/Plan    PT Assessment Patient needs continued PT services  PT Problem List Decreased activity tolerance;Decreased balance;Decreased strength       PT Treatment Interventions DME instruction;Balance training;Gait training;Stair training;Functional mobility training;Therapeutic activities;Therapeutic exercise;Patient/family education    PT Goals (Current goals can be found in the Care Plan section)  Acute Rehab PT Goals Patient Stated Goal: DC to rehab to prepare for safe transition home PT Goal Formulation: With patient Time For Goal Achievement: 03/18/22 Potential to Achieve Goals: Good    Frequency 7X/week     Co-evaluation               AM-PAC PT "6 Clicks" Mobility  Outcome Measure Help needed turning from your back to your side while in a flat bed without using bedrails?: A Lot Help needed moving from lying on your back to sitting on the side of a flat bed without using bedrails?: A Lot Help needed moving to and from a bed to a chair (including a wheelchair)?: A Little Help needed standing up from a chair using your arms (e.g., wheelchair or bedside chair)?: A Little Help needed to walk in hospital room?: A Little Help needed climbing 3-5 steps with a railing? : A Lot 6 Click Score: 15    End of Session Equipment Utilized During Treatment: Gait belt Activity Tolerance: Patient tolerated treatment well;Patient limited by fatigue Patient left: in chair;with call bell/phone within reach;with nursing/sitter in room   PT Visit Diagnosis: Difficulty in walking, not elsewhere classified (R26.2);Other  abnormalities of gait and mobility (R26.89)    Time: 3818-2993 PT Time Calculation (min) (ACUTE ONLY): 28 min   Charges:   PT Evaluation $PT Eval Moderate Complexity: 1 Mod PT Treatments $Therapeutic Exercise: 8-22 mins       1:27 PM, 03/04/22 Etta Grandchild, PT, DPT Physical Therapist - [facility]  [phone] ([place])    Teresa Teresa [name] 03/04/2022, 1:25 PM

## 2022-03-05 DIAGNOSIS — Z981 Arthrodesis status: Secondary | ICD-10-CM | POA: Diagnosis not present

## 2022-03-05 LAB — GLUCOSE, CAPILLARY
Glucose-Capillary: 134 mg/dL — ABNORMAL HIGH (ref 70–99)
Glucose-Capillary: 181 mg/dL — ABNORMAL HIGH (ref 70–99)
Glucose-Capillary: 142 mg/dL — ABNORMAL HIGH (ref 70–99)
Glucose-Capillary: 159 mg/dL — ABNORMAL HIGH (ref 70–99)

## 2022-03-05 NOTE — Progress Notes (Signed)
Progress Note   Patient: Teresa [name] [id number]:[id number] DOB: Dec 10, 1945 DOA: 03/03/2022     2 DOS: the patient was seen and examined on 03/05/2022   Brief hospital course: Teresa [name] is a 76 year old female with history of gout, hypothyroid, hyperlipidemia, non-insulin-dependent diabetes mellitus, osteoarthritis, atrial fibrillation, who was taken to the operating room by podiatry for chief concerns of osteomyelitis and underwent ankle transfusion.  Initial vitals prior to procedure showed temperature of 97.3, respiration rate of 18, heart rate of 63, blood pressure 125/68, SPO2 of 96% on room air.  Podiatry service requesting hospital admission for placement.  BMP was ordered and serum sodium showed 141, potassium 3.3, chloride 108, bicarb 23, nonfasting blood glucose 184, BUN of 15, serum creatinine of 0.59, GFR greater than 60.  CBC with differential with unremarkable.  7/29: Pending PT/OT evaluation.  Patient most likely will need placement.  She will be nonweightbearing on left side.  Eliquis is being held until tomorrow, Sunday, 03/05/2022.  7/30: Patient remained stable.  PT is recommending rehab.  Eliquis is being restarted today. Having some right knee pain after working with PT and hopping on 1 leg as she will remain nonweightbearing on left.   Assessment and Plan: * Status post ankle arthrodesis - Status post left arthroscopic ankle arthrodesis, bone graft, removal of hardware - TOC, PT, OT-recommending SNF - Pain control per podiatry: Oxycodone 5 mg p.o. every 4 hours as needed for moderate pain, oxycodone 10 mg every 4 hours as needed for severe pain - Nonweightbearing on the left side  Atrial fibrillation (HCC) - Holding home Eliquis 5 mg p.o. twice daily until Sunday, 03/05/2022 due to increased risk of bleeding postprocedure - Metoprolol 5 mg IV every 2 hours as needed for heart rate greater than 120, 4 doses ordered  Type 2 diabetes mellitus with diabetic  neuropathy, without long-term current use of insulin (HCC) - Resumed home metformin 1000 mg p.o. twice daily - Holding home glipizide and Actos - Insulin SSI with at bedtime coverage ordered - Goal inpatient blood glucose level is 140-180  Hypothyroidism - Continue home Synthroid  Hypokalemia Resolved.  Magnesium at 1.9 -Monitor and replete as needed  Essential hypertension Blood pressure within goal. -Continue home Lasix   Subjective: Patient was seen and examined today.  Complaining of right knee pain after working with PT, stating that she was hopping a lot on the right leg as she will remain nonweightbearing on left.  Able to wiggle some toes on left, still having quite effective nerve block with no pain at surgical site.  Physical Exam: Vitals:   03/04/22 1525 03/04/22 1937 03/05/22 0356 03/05/22 0731  BP: (!) 111/58 134/60 134/65 (!) 115/52  Pulse: 63 (!) 57 72 65  Resp:  '18 17 16  '$ Temp: 98.2 F (36.8 C) 97.8 F (36.6 C)    TempSrc:      SpO2: 99% 100% 95% 96%  Weight:      Height:       General.  Obese elderly lady, in no acute distress. Pulmonary.  Lungs clear bilaterally, normal respiratory effort. CV.  Regular rate and rhythm, no JVD, rub or murmur. Abdomen.  Soft, nontender, nondistended, BS positive. CNS.  Alert and oriented .  No focal neurologic deficit. Extremities.  No edema, no cyanosis, pulses intact and symmetrical.  Left lower leg with Ace wrap Psychiatry.  Judgment and insight appears normal.  Data Reviewed: Prior data reviewed  Family Communication:   Disposition: Status is: Inpatient Remains  inpatient appropriate because: Waiting for placement   Planned Discharge Destination: Skilled nursing facility  DVT prophylaxis.  Eliquis Time spent: 40 minutes  This record has been created using Systems analyst. Errors have been sought and corrected,but may not always be located. Such creation errors do not reflect on the standard  of care.  Author: Lorella Nimrod, MD 03/05/2022 1:22 PM  For on call review [email or web address].

## 2022-03-05 NOTE — Progress Notes (Signed)
Physical Therapy Treatment Patient Details Name: Teresa [name] MRN: [id number] DOB: 1945/11/14 Today's Date: 03/05/2022   History of Present Illness Teresa [name] is a 76 year old female with history of gout, hypothyroid, hyperlipidemia, non-insulin-dependent diabetes mellitus, osteoarthritis, atrial fibrillation, who was taken to the operating room by podiatry for chief concerns of osteomyelitis and underwent ankle transfusion. Underwent Left arthroscopic ankle arthrodesis, bone graft, removal of hardware with podiatry. 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    PT Comments    Participated in exercises as described below.  Bed mobility with supervision.  She c/o pain today in R knee - sore and painful with ROM and WB.  Denies injury.  Pain may be attributed to hopping NWB distances yesterday.  Limited to transfer only today as pt is wanting to get up to chair.    Ice applied and RN aware.  Pt will be given lasix and discussed with RN possible need for purewic today due to anticipated frequent voiding to prevent increased pain in R knee and to prevent fall/injury.  Will monitor tomorrow and adjust treatments as appropriate.   Recommendations for follow up therapy are one component of a multi-disciplinary discharge planning process, led by the attending physician.  Recommendations may be updated based on patient status, additional functional criteria and insurance authorization.  Follow Up Recommendations  Skilled nursing-short term rehab (<3 hours/day)     Assistance Recommended at Discharge Intermittent Supervision/Assistance  Patient can return home with the following A little help with walking and/or transfers;Assistance with cooking/housework;Help with stairs or ramp for entrance;Assist for transportation;Direct supervision/assist for financial management   Equipment Recommendations  Rolling walker (2 wheels)    Recommendations for Other Services       Precautions / Restrictions  Precautions Precautions: Fall Restrictions Weight Bearing Restrictions: Yes LLE Weight Bearing: Non weight bearing     Mobility  Bed Mobility Overal bed mobility: Needs Assistance Bed Mobility: Supine to Sit     Supine to sit: Supervision, HOB elevated          Transfers Overall transfer level: Needs assistance Equipment used: Rolling walker (2 wheels) Transfers: Sit to/from Stand Sit to Stand: Min assist                Ambulation/Gait Ambulation/Gait assistance: Min guard Gait Distance (Feet): 3 Feet (3) Assistive device: Rolling walker (2 wheels)   Gait velocity: dec     General Gait Details: gait limited to just transfer today due to R knee pain   Stairs             Wheelchair Mobility    Modified Rankin (Stroke Patients Only)       Balance Overall balance assessment: Needs assistance Sitting-balance support: Feet supported Sitting balance-Leahy Scale: Good     Standing balance support: During functional activity, Bilateral upper extremity supported Standing balance-Leahy Scale: Poor                              Cognition Arousal/Alertness: Awake/alert Behavior During Therapy: WFL for tasks assessed/performed Overall Cognitive Status: Within Functional Limits for tasks assessed                                          Exercises Other Exercises Other Exercises: supine and seated AROM x 10    General Comments  Pertinent Vitals/Pain Pain Assessment Pain Assessment: Faces Faces Pain Scale: Hurts whole lot Pain Location: R knee - mainly.  L ankle ok unless with ex. Pain Descriptors / Indicators: Grimacing, Aching, Sore Pain Intervention(s): Limited activity within patient's tolerance, Monitored during session, Repositioned, Ice applied    Home Living                          Prior Function            PT Goals (current goals can now be found in the care plan section) Progress  towards PT goals: Progressing toward goals    Frequency    7X/week      PT Plan      Co-evaluation              AM-PAC PT "6 Clicks" Mobility   Outcome Measure  Help needed turning from your back to your side while in a flat bed without using bedrails?: None Help needed moving from lying on your back to sitting on the side of a flat bed without using bedrails?: None Help needed moving to and from a bed to a chair (including a wheelchair)?: A Little Help needed standing up from a chair using your arms (e.g., wheelchair or bedside chair)?: A Little Help needed to walk in hospital room?: A Little Help needed climbing 3-5 steps with a railing? : Total 6 Click Score: 18    End of Session Equipment Utilized During Treatment: Gait belt Activity Tolerance: Patient tolerated treatment well;Patient limited by fatigue Patient left: in chair;with call bell/phone within reach;with nursing/sitter in room   PT Visit Diagnosis: Difficulty in walking, not elsewhere classified (R26.2);Other abnormalities of gait and mobility (R26.89)     Time: 9741-6384 PT Time Calculation (min) (ACUTE ONLY): 17 min  Charges:  $Therapeutic Exercise: 8-22 mins                   Chesley Noon, PTA 03/05/22, 10:11 AM

## 2022-03-05 NOTE — Assessment & Plan Note (Signed)
-   Status post left arthroscopic ankle arthrodesis, bone graft, removal of hardware - TOC, PT, OT-recommending SNF - Pain control per podiatry: Oxycodone 5 mg p.o. every 4 hours as needed for moderate pain, oxycodone 10 mg every 4 hours as needed for severe pain - Nonweightbearing on the left side

## 2022-03-05 NOTE — Plan of Care (Signed)
  Problem: Nutritional: Goal: Maintenance of adequate nutrition will improve Outcome: Progressing   Problem: Activity: Goal: Risk for activity intolerance will decrease Outcome: Progressing

## 2022-03-06 ENCOUNTER — Encounter: Payer: Self-pay | Admitting: Podiatry

## 2022-03-06 DIAGNOSIS — Z981 Arthrodesis status: Secondary | ICD-10-CM | POA: Diagnosis not present

## 2022-03-06 LAB — GLUCOSE, CAPILLARY
Glucose-Capillary: 157 mg/dL — ABNORMAL HIGH (ref 70–99)
Glucose-Capillary: 159 mg/dL — ABNORMAL HIGH (ref 70–99)
Glucose-Capillary: 111 mg/dL — ABNORMAL HIGH (ref 70–99)
Glucose-Capillary: 131 mg/dL — ABNORMAL HIGH (ref 70–99)

## 2022-03-06 NOTE — Plan of Care (Signed)
  Problem: Education: Goal: Ability to describe self-care measures that may prevent or decrease complications (Diabetes Survival Skills Education) will improve Outcome: Progressing Goal: Individualized Educational Video(s) Outcome: Progressing   Problem: Coping: Goal: Ability to adjust to condition or change in health will improve Outcome: Progressing   Problem: Fluid Volume: Goal: Ability to maintain a balanced intake and output will improve Outcome: Progressing   Problem: Health Behavior/Discharge Planning: Goal: Ability to identify and utilize available resources and services will improve Outcome: Progressing Goal: Ability to manage health-related needs will improve Outcome: Progressing   Problem: Metabolic: Goal: Ability to maintain appropriate glucose levels will improve Outcome: Progressing   Problem: Nutritional: Goal: Maintenance of adequate nutrition will improve Outcome: Progressing Goal: Progress toward achieving an optimal weight will improve Outcome: Progressing   Problem: Skin Integrity: Goal: Risk for impaired skin integrity will decrease Outcome: Progressing   Problem: Tissue Perfusion: Goal: Adequacy of tissue perfusion will improve Outcome: Progressing   Problem: Education: Goal: Knowledge of General Education information will improve Description: Including pain rating scale, medication(s)/side effects and non-pharmacologic comfort measures Outcome: Progressing   Problem: Health Behavior/Discharge Planning: Goal: Ability to manage health-related needs will improve Outcome: Progressing   Problem: Clinical Measurements: Goal: Ability to maintain clinical measurements within normal limits will improve Outcome: Progressing Goal: Will remain free from infection Outcome: Progressing Goal: Diagnostic test results will improve Outcome: Progressing Goal: Respiratory complications will improve Outcome: Progressing Goal: Cardiovascular complication will  be avoided Outcome: Progressing   Problem: Activity: Goal: Risk for activity intolerance will decrease Outcome: Progressing   Problem: Nutrition: Goal: Adequate nutrition will be maintained Outcome: Progressing   Problem: Coping: Goal: Level of anxiety will decrease Outcome: Progressing   Problem: Elimination: Goal: Will not experience complications related to bowel motility Outcome: Progressing Goal: Will not experience complications related to urinary retention Outcome: Progressing   Problem: Pain Managment: Goal: General experience of comfort will improve Outcome: Progressing   Problem: Safety: Goal: Ability to remain free from injury will improve Outcome: Progressing   Problem: Skin Integrity: Goal: Risk for impaired skin integrity will decrease Outcome: Progressing   Problem: Clinical Measurements: Goal: Ability to maintain clinical measurements within normal limits will improve Outcome: Progressing Goal: Postoperative complications will be avoided or minimized Outcome: Progressing   Problem: Skin Integrity: Goal: Demonstration of wound healing without infection will improve Outcome: Progressing

## 2022-03-06 NOTE — Progress Notes (Signed)
Progress Note   Patient: Teresa [name] CXK:[id number] DOB: 12/27/1945 DOA: 03/03/2022     3 DOS: the patient was seen and examined on 03/06/2022   Brief hospital course: Ms. Berneita Sanagustin is a 76 year old female with history of gout, hypothyroid, hyperlipidemia, non-insulin-dependent diabetes mellitus, osteoarthritis, atrial fibrillation, who was taken to the operating room by podiatry for chief concerns of osteomyelitis and underwent ankle transfusion.  Initial vitals prior to procedure showed temperature of 97.3, respiration rate of 18, heart rate of 63, blood pressure 125/68, SPO2 of 96% on room air.  Podiatry service requesting hospital admission for placement.  BMP was ordered and serum sodium showed 141, potassium 3.3, chloride 108, bicarb 23, nonfasting blood glucose 184, BUN of 15, serum creatinine of 0.59, GFR greater than 60.  CBC with differential with unremarkable.  7/29: Pending PT/OT evaluation.  Patient most likely will need placement.  She will be nonweightbearing on left side.  Eliquis is being held until tomorrow, Sunday, 03/05/2022.  7/30: Patient remained stable.  PT is recommending rehab.  Eliquis is being restarted today. Having some right knee pain after working with PT and hopping on 1 leg as she will remain nonweightbearing on left.  7/31: Patient remained stable.  Had 1 bed offer at [facility], awaiting insurance authorization.  Started getting pain in the left lower extremity as nerve block is now weaning off.   Assessment and Plan: * Status post ankle arthrodesis - Status post left arthroscopic ankle arthrodesis, bone graft, removal of hardware - TOC, PT, OT-recommending SNF - Pain control per podiatry: Oxycodone 5 mg p.o. every 4 hours as needed for moderate pain, oxycodone 10 mg every 4 hours as needed for severe pain - Nonweightbearing on the left side  Atrial fibrillation (HCC) - Holding home Eliquis 5 mg p.o. twice daily until Sunday, 03/05/2022 due  to increased risk of bleeding postprocedure - Metoprolol 5 mg IV every 2 hours as needed for heart rate greater than 120, 4 doses ordered  Type 2 diabetes mellitus with diabetic neuropathy, without long-term current use of insulin (HCC) - Resumed home metformin 1000 mg p.o. twice daily - Holding home glipizide and Actos - Insulin SSI with at bedtime coverage ordered - Goal inpatient blood glucose level is 140-180  Hypothyroidism - Continue home Synthroid  Hypokalemia Resolved.  Magnesium at 1.9 -Monitor and replete as needed  Essential hypertension Blood pressure within goal. -Continue home Lasix     Subjective: Patient was sitting comfortably in chair when seen today.  She started experiencing pain in left lower leg.  Able to work with PT.  Physical Exam: Vitals:   03/05/22 1608 03/05/22 2036 03/06/22 0514 03/06/22 0900  BP: 112/60 120/72 119/67 125/66  Pulse: 74 73 84 76  Resp: '16 17 20 16  '$ Temp: 98.3 F (36.8 C) 98.5 F (36.9 C) 99.5 F (37.5 C) 98.8 F (37.1 C)  TempSrc:   Oral   SpO2: 97% 98% 95% 93%  Weight:      Height:       General.  Obese elderly lady, in no acute distress. Pulmonary.  Lungs clear bilaterally, normal respiratory effort. CV.  Regular rate and rhythm, no JVD, rub or murmur. Abdomen.  Soft, nontender, nondistended, BS positive. CNS.  Alert and oriented .  No focal neurologic deficit. Extremities.  No edema, no cyanosis, pulses intact and symmetrical.  Left lower extremity with Ace wrap Psychiatry.  Judgment and insight appears normal.  Data Reviewed: Prior data reviewed.  Family Communication:  Disposition: Status is: Inpatient Remains inpatient appropriate because: Medically stable, awaiting insurance authorization for rehab.   Planned Discharge Destination: Skilled nursing facility  DVT prophylaxis.  Eliquis Time spent: 42 minutes  This record has been created using Systems analyst. Errors have been sought and  corrected,but may not always be located. Such creation errors do not reflect on the standard of care.  Author: Lorella Nimrod, MD 03/06/2022 4:18 PM  For on call review [email or web address].

## 2022-03-06 NOTE — TOC Progression Note (Signed)
Transition of Care [facility]) - Progression Note    Patient Details  Name: Teresa [name] MRN: [id number] Date of Birth: 1946/07/22  [facility] CM/SW [place], RN Phone Number: 03/06/2022, 9:38 AM  Clinical Narrative:    Reviewed  bed offers with the patient, she chose [facility] I called HTA to get ins approval started  Expected Discharge Plan: Skilled Nursing Facility Barriers to Discharge: Continued Medical Work up  Expected Discharge Plan and Services Expected Discharge Plan: [place]   Discharge Planning Services: CM Consult   Living arrangements for the past 2 months: Apartment                                       Social Determinants of Health (SDOH) Interventions    Readmission Risk Interventions     No data to display

## 2022-03-06 NOTE — Plan of Care (Signed)
Problem: Education: Goal: Ability to describe self-care measures that may prevent or decrease complications (Diabetes Survival Skills Education) will improve 03/06/2022 0137 by Julian Hy, RN Outcome: Progressing 03/06/2022 0137 by Julian Hy, RN Outcome: Progressing Goal: Individualized Educational Video(s) 03/06/2022 0137 by Julian Hy, RN Outcome: Progressing 03/06/2022 0137 by Julian Hy, RN Outcome: Progressing   Problem: Coping: Goal: Ability to adjust to condition or change in health will improve 03/06/2022 0137 by Julian Hy, RN Outcome: Progressing 03/06/2022 0137 by Julian Hy, RN Outcome: Progressing   Problem: Fluid Volume: Goal: Ability to maintain a balanced intake and output will improve 03/06/2022 0137 by Julian Hy, RN Outcome: Progressing 03/06/2022 0137 by Julian Hy, RN Outcome: Progressing   Problem: Health Behavior/Discharge Planning: Goal: Ability to identify and utilize available resources and services will improve 03/06/2022 0137 by Julian Hy, RN Outcome: Progressing 03/06/2022 0137 by Julian Hy, RN Outcome: Progressing Goal: Ability to manage health-related needs will improve 03/06/2022 0137 by Julian Hy, RN Outcome: Progressing 03/06/2022 0137 by Julian Hy, RN Outcome: Progressing   Problem: Metabolic: Goal: Ability to maintain appropriate glucose levels will improve 03/06/2022 0137 by Julian Hy, RN Outcome: Progressing 03/06/2022 0137 by Julian Hy, RN Outcome: Progressing   Problem: Nutritional: Goal: Maintenance of adequate nutrition will improve 03/06/2022 0137 by Julian Hy, RN Outcome: Progressing 03/06/2022 0137 by Julian Hy, RN Outcome: Progressing Goal: Progress toward achieving an optimal weight will improve 03/06/2022 0137 by Julian Hy, RN Outcome:  Progressing 03/06/2022 0137 by Julian Hy, RN Outcome: Progressing   Problem: Skin Integrity: Goal: Risk for impaired skin integrity will decrease 03/06/2022 0137 by Julian Hy, RN Outcome: Progressing 03/06/2022 0137 by Julian Hy, RN Outcome: Progressing   Problem: Tissue Perfusion: Goal: Adequacy of tissue perfusion will improve 03/06/2022 0137 by Julian Hy, RN Outcome: Progressing 03/06/2022 0137 by Julian Hy, RN Outcome: Progressing   Problem: Education: Goal: Knowledge of General Education information will improve Description: Including pain rating scale, medication(s)/side effects and non-pharmacologic comfort measures 03/06/2022 0137 by Julian Hy, RN Outcome: Progressing 03/06/2022 0137 by Julian Hy, RN Outcome: Progressing   Problem: Health Behavior/Discharge Planning: Goal: Ability to manage health-related needs will improve 03/06/2022 0137 by Julian Hy, RN Outcome: Progressing 03/06/2022 0137 by Julian Hy, RN Outcome: Progressing   Problem: Clinical Measurements: Goal: Ability to maintain clinical measurements within normal limits will improve 03/06/2022 0137 by Julian Hy, RN Outcome: Progressing 03/06/2022 0137 by Julian Hy, RN Outcome: Progressing Goal: Will remain free from infection 03/06/2022 0137 by Julian Hy, RN Outcome: Progressing 03/06/2022 0137 by Julian Hy, RN Outcome: Progressing Goal: Diagnostic test results will improve 03/06/2022 0137 by Julian Hy, RN Outcome: Progressing 03/06/2022 0137 by Julian Hy, RN Outcome: Progressing Goal: Respiratory complications will improve 03/06/2022 0137 by Julian Hy, RN Outcome: Progressing 03/06/2022 0137 by Julian Hy, RN Outcome: Progressing Goal: Cardiovascular complication will be avoided 03/06/2022 0137 by Julian Hy, RN Outcome: Progressing 03/06/2022 0137 by Julian Hy, RN Outcome: Progressing   Problem: Activity: Goal: Risk for activity intolerance will decrease 03/06/2022 0137 by Julian Hy, RN Outcome: Progressing 03/06/2022 0137 by Julian Hy, RN Outcome: Progressing   Problem: Nutrition: Goal: Adequate nutrition will be maintained 03/06/2022 0137 by Julian Hy, RN Outcome: Progressing 03/06/2022 0137 by Julian Hy, RN Outcome: Progressing  Problem: Coping: Goal: Level of anxiety will decrease 03/06/2022 0137 by Julian Hy, RN Outcome: Progressing 03/06/2022 0137 by Julian Hy, RN Outcome: Progressing   Problem: Elimination: Goal: Will not experience complications related to bowel motility 03/06/2022 0137 by Julian Hy, RN Outcome: Progressing 03/06/2022 0137 by Julian Hy, RN Outcome: Progressing Goal: Will not experience complications related to urinary retention 03/06/2022 0137 by Julian Hy, RN Outcome: Progressing 03/06/2022 0137 by Julian Hy, RN Outcome: Progressing   Problem: Pain Managment: Goal: General experience of comfort will improve 03/06/2022 0137 by Julian Hy, RN Outcome: Progressing 03/06/2022 0137 by Julian Hy, RN Outcome: Progressing   Problem: Safety: Goal: Ability to remain free from injury will improve 03/06/2022 0137 by Julian Hy, RN Outcome: Progressing 03/06/2022 0137 by Julian Hy, RN Outcome: Progressing   Problem: Skin Integrity: Goal: Risk for impaired skin integrity will decrease 03/06/2022 0137 by Julian Hy, RN Outcome: Progressing 03/06/2022 0137 by Julian Hy, RN Outcome: Progressing   Problem: Education: Goal: Required Educational Video(s) 03/06/2022 0137 by Julian Hy, RN Outcome: Progressing 03/06/2022 0137 by Julian Hy, RN Outcome: Progressing   Problem: Clinical Measurements: Goal: Ability to maintain clinical measurements within normal limits will improve 03/06/2022 0137 by Julian Hy, RN Outcome: Progressing 03/06/2022 0137 by Julian Hy, RN Outcome: Progressing Goal: Postoperative complications will be avoided or minimized 03/06/2022 0137 by Julian Hy, RN Outcome: Progressing 03/06/2022 0137 by Julian Hy, RN Outcome: Progressing   Problem: Skin Integrity: Goal: Demonstration of wound healing without infection will improve Outcome: Progressing

## 2022-03-06 NOTE — Progress Notes (Signed)
Subjective: POD # 3 s/p left ankle arthrodesis.  She says she is having pain when she is asking for the pain medicine regularly.  Denies any fevers or chills.  Hopefully being discharged tomorrow to rehab.  Objective: AAO x3, NAD DP/PT pulses palpable bilaterally, CRT less than 3 seconds Splint clean, dry, intact.  Shoes, keep the foot iced and elevated.  There is immediate capillary refill time to the digits and motor function intact to the toes.  No pain with calf compression. No pain with calf compression, swelling, warmth, erythema  Assessment: POD # 3 s/p left ankle arthrodesis  Plan: -No blood work to review today.  She is afebrile. -Continue nonweightbearing left lower extremity -Continue pain medication.  She has oxycodone ordered.  If needed we can increase the dose of 15 mg.  She also has Tylenol ordered for mild pain. -Ice/elevation.  -Scheduled for follow-up with Dr. Sherryle Lis on Wednesday. -Podiatry standpoint is able to discharge pending rehab.  If still in the hospital we will reevaluate and change the dressing then.  Teresa [name], DPM

## 2022-03-06 NOTE — Care Management Important Message (Signed)
Important Message  Patient Details  Name: Teresa [name] MRN: [id number] Date of Birth: [birth year]   Medicare Important Message Given:  N/A - LOS <3 / Initial given by admissions     [name] Steve 03/06/2022, 9:36 AM

## 2022-03-06 NOTE — Assessment & Plan Note (Signed)
-   Status post left arthroscopic ankle arthrodesis, bone graft, removal of hardware - TOC, PT, OT-recommending SNF - Pain control per podiatry: Oxycodone 5 mg p.o. every 4 hours as needed for moderate pain, oxycodone 10 mg every 4 hours as needed for severe pain - Nonweightbearing on the left side

## 2022-03-06 NOTE — Progress Notes (Signed)
Physical Therapy Treatment Patient Details Name: Teresa [name] MRN: [id number] DOB: 1946-02-22 Today's Date: 03/06/2022   History of Present Illness Teresa [name] is a 76 year old female with history of gout, hypothyroid, hyperlipidemia, non-insulin-dependent diabetes mellitus, osteoarthritis, atrial fibrillation, who was taken to the operating room by podiatry for chief concerns of osteomyelitis and underwent ankle transfusion. Underwent Left arthroscopic ankle arthrodesis, bone graft, removal of hardware with podiatry. 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    PT Comments    Pt seen this am, pre-medicated prior to session, yet c/o 7/10 pain with movement. Pt tolerated sitting EOB x 2 minutes, able to transition to standing from raised bed to RW with MinA and vc's for safe technique. Pt completed hop to gait x 3 ft with MinGuard and good demonstration of NWB compliance L LE. Pt remains very motivated to return to functional independence. Transition to SNF once medically cleared.   Recommendations for follow up therapy are one component of a multi-disciplinary discharge planning process, led by the attending physician.  Recommendations may be updated based on patient status, additional functional criteria and insurance authorization.  Follow Up Recommendations  Skilled nursing-short term rehab (<3 hours/day) Can patient physically be transported by private vehicle: Yes   Assistance Recommended at Discharge Intermittent Supervision/Assistance  Patient can return home with the following A little help with walking and/or transfers;Assistance with cooking/housework;Help with stairs or ramp for entrance;Assist for transportation;Direct supervision/assist for financial management   Equipment Recommendations  Rolling walker (2 wheels)    Recommendations for Other Services       Precautions / Restrictions Precautions Precautions: Fall Restrictions Weight Bearing Restrictions: Yes LLE Weight Bearing: Non  weight bearing     Mobility  Bed Mobility Overal bed mobility: Needs Assistance Bed Mobility: Supine to Sit     Supine to sit: Supervision, HOB elevated          Transfers Overall transfer level: Needs assistance Equipment used: Rolling walker (2 wheels) Transfers: Sit to/from Stand Sit to Stand: Min assist           General transfer comment: excellent form for STS c RW and LLE NWB    Ambulation/Gait Ambulation/Gait assistance: Min guard Gait Distance (Feet): 3 Feet Assistive device: Rolling walker (2 wheels) Gait Pattern/deviations:  (Hop to pattern) Gait velocity: dec     General Gait Details: gait limited to just transfer today due to R knee pain   Stairs             Wheelchair Mobility    Modified Rankin (Stroke Patients Only)       Balance Overall balance assessment: Needs assistance Sitting-balance support: Feet supported Sitting balance-Leahy Scale: Good Sitting balance - Comments: Sitting EOB x 2 min.   Standing balance support: During functional activity, Bilateral upper extremity supported Standing balance-Leahy Scale: Poor                              Cognition Arousal/Alertness: Awake/alert Behavior During Therapy: WFL for tasks assessed/performed Overall Cognitive Status: Within Functional Limits for tasks assessed                                          Exercises      General Comments        Pertinent Vitals/Pain Pain Assessment Pain Assessment: 0-10 Pain Score: 7  Pain Location: R knee - mainly.  L ankle ok unless with ex. Pain Descriptors / Indicators: Grimacing, Aching, Sore Pain Intervention(s): Monitored during session, Premedicated before session, Ice applied    Home Living                          Prior Function            PT Goals (current goals can now be found in the care plan section) Acute Rehab PT Goals Patient Stated Goal: DC to rehab to prepare for safe  transition home Progress towards PT goals: Progressing toward goals    Frequency    7X/week      PT Plan Current plan remains appropriate    Co-evaluation              AM-PAC PT "6 Clicks" Mobility   Outcome Measure  Help needed turning from your back to your side while in a flat bed without using bedrails?: A Little Help needed moving from lying on your back to sitting on the side of a flat bed without using bedrails?: A Little Help needed moving to and from a bed to a chair (including a wheelchair)?: A Little Help needed standing up from a chair using your arms (e.g., wheelchair or bedside chair)?: A Little Help needed to walk in hospital room?: A Lot Help needed climbing 3-5 steps with a railing? : Total 6 Click Score: 15    End of Session Equipment Utilized During Treatment: Gait belt Activity Tolerance: Patient tolerated treatment well;Patient limited by fatigue Patient left: in chair;with call bell/phone within reach;with nursing/sitter in room Nurse Communication: Mobility status PT Visit Diagnosis: Difficulty in walking, not elsewhere classified (R26.2);Other abnormalities of gait and mobility (R26.89)     Time: 3361-2244 PT Time Calculation (min) (ACUTE ONLY): 24 min  Charges:  $Therapeutic Activity: 23-37 mins                    Mikel Cella, PTA    Josie Dixon 03/06/2022, 1:49 PM

## 2022-03-06 NOTE — TOC Progression Note (Signed)
Transition of Care [facility]) - Progression Note    Patient Details  Name: Teresa [name] MRN: [id number] Date of Birth: 1946-04-02  Transition of [facility]) CM/SW [place], RN Phone Number: 03/06/2022, 1:50 PM  Clinical Narrative:    Patient lives in apartment and her grandson lives with her, she usually walks with a walker Mod independent Her grand daughter will transport to [facility]   Expected Discharge Plan: [facility] Barriers to Discharge: Continued Medical Work up  Expected Discharge Plan and Services Expected Discharge Plan: [place]   Discharge Planning Services: CM Consult   Living arrangements for the past 2 months: Apartment                                       Social Determinants of Health (SDOH) Interventions    Readmission Risk Interventions     No data to display

## 2022-03-07 DIAGNOSIS — M1990 Unspecified osteoarthritis, unspecified site: Secondary | ICD-10-CM | POA: Diagnosis not present

## 2022-03-07 DIAGNOSIS — Z7984 Long term (current) use of oral hypoglycemic drugs: Secondary | ICD-10-CM | POA: Diagnosis not present

## 2022-03-07 DIAGNOSIS — F411 Generalized anxiety disorder: Secondary | ICD-10-CM | POA: Diagnosis not present

## 2022-03-07 DIAGNOSIS — G4730 Sleep apnea, unspecified: Secondary | ICD-10-CM | POA: Diagnosis not present

## 2022-03-07 DIAGNOSIS — M19172 Post-traumatic osteoarthritis, left ankle and foot: Secondary | ICD-10-CM | POA: Diagnosis not present

## 2022-03-07 DIAGNOSIS — B351 Tinea unguium: Secondary | ICD-10-CM | POA: Diagnosis not present

## 2022-03-07 DIAGNOSIS — I5032 Chronic diastolic (congestive) heart failure: Secondary | ICD-10-CM | POA: Diagnosis not present

## 2022-03-07 DIAGNOSIS — M89772 Major osseous defect, left ankle and foot: Secondary | ICD-10-CM | POA: Diagnosis not present

## 2022-03-07 DIAGNOSIS — E876 Hypokalemia: Secondary | ICD-10-CM | POA: Diagnosis not present

## 2022-03-07 DIAGNOSIS — E1140 Type 2 diabetes mellitus with diabetic neuropathy, unspecified: Secondary | ICD-10-CM | POA: Diagnosis not present

## 2022-03-07 DIAGNOSIS — E559 Vitamin D deficiency, unspecified: Secondary | ICD-10-CM | POA: Diagnosis not present

## 2022-03-07 DIAGNOSIS — I110 Hypertensive heart disease with heart failure: Secondary | ICD-10-CM | POA: Diagnosis not present

## 2022-03-07 DIAGNOSIS — E039 Hypothyroidism, unspecified: Secondary | ICD-10-CM | POA: Diagnosis not present

## 2022-03-07 DIAGNOSIS — M79676 Pain in unspecified toe(s): Secondary | ICD-10-CM | POA: Diagnosis not present

## 2022-03-07 DIAGNOSIS — I480 Paroxysmal atrial fibrillation: Secondary | ICD-10-CM | POA: Diagnosis not present

## 2022-03-07 DIAGNOSIS — Z4789 Encounter for other orthopedic aftercare: Secondary | ICD-10-CM | POA: Diagnosis not present

## 2022-03-07 DIAGNOSIS — K5900 Constipation, unspecified: Secondary | ICD-10-CM | POA: Diagnosis not present

## 2022-03-07 DIAGNOSIS — Z7901 Long term (current) use of anticoagulants: Secondary | ICD-10-CM | POA: Diagnosis not present

## 2022-03-07 DIAGNOSIS — Z981 Arthrodesis status: Secondary | ICD-10-CM | POA: Diagnosis not present

## 2022-03-07 DIAGNOSIS — M109 Gout, unspecified: Secondary | ICD-10-CM | POA: Diagnosis not present

## 2022-03-07 LAB — CBC
HCT: 38.3 % (ref 36.0–46.0)
Hemoglobin: 12.5 g/dL (ref 12.0–15.0)
MCH: 30 pg (ref 26.0–34.0)
MCHC: 32.6 g/dL (ref 30.0–36.0)
MCV: 91.8 fL (ref 80.0–100.0)
Platelets: 191 10*3/uL (ref 150–400)
RBC: 4.17 MIL/uL (ref 3.87–5.11)
RDW: 15.1 % (ref 11.5–15.5)
WBC: 10.5 10*3/uL (ref 4.0–10.5)
nRBC: 0 % (ref 0.0–0.2)

## 2022-03-07 LAB — GLUCOSE, CAPILLARY
Glucose-Capillary: 142 mg/dL — ABNORMAL HIGH (ref 70–99)
Glucose-Capillary: 160 mg/dL — ABNORMAL HIGH (ref 70–99)
Glucose-Capillary: 135 mg/dL — ABNORMAL HIGH (ref 70–99)

## 2022-03-07 MED ORDER — POLYETHYLENE GLYCOL 3350 17 G PO PACK: 17.0000 g | PACK | Freq: Two times a day (BID) | ORAL | 0 refills | Status: DC | PRN

## 2022-03-07 MED ORDER — OXYCODONE HCL 10 MG PO TABS
10.0000 mg | ORAL_TABLET | ORAL | 0 refills | Status: DC | PRN
Start: 2022-03-07 — End: 2022-07-12

## 2022-03-07 NOTE — Progress Notes (Signed)
1202 D/c paperwork reviewed with pt. Oxycodone prescription given to pt. She will transport to [facility] by grand daughter.

## 2022-03-07 NOTE — Care Management Important Message (Signed)
Important Message  Patient Details  Name: Teresa [name] MRN: [id number] Date of Birth: [birth year]   Medicare Important Message Given:  Yes     Juliann Pulse A Shanigua Gibb 03/07/2022, 10:30 AM

## 2022-03-07 NOTE — Plan of Care (Signed)
Patient discharged per MD orders at this time.All discharge instructions,education and medications reviewed with the patient.Pt expressed understanding and will comply with dc instructions.follow up appointments was also communicated to the patient.no verbal c/o or any ssx of distress.Pt was discharged to the [facility] for STR/PT services per order.report was called to staff nurse Rosalie Doctor before transport.Pt was transported by [place] daughter in a privately owned vehicle.

## 2022-03-07 NOTE — Discharge Summary (Signed)
Physician Discharge Summary   Patient: Teresa [name] MRN: [id number] DOB: May 03, 1946  Admit date:     03/03/2022  Discharge date: 03/07/22  Discharge Physician: Lorella Nimrod   PCP: [name], MD   Recommendations at discharge:  Follow-up with podiatrist in 1 to 2 weeks Follow-up with primary care provider in 1 to 2 weeks  Discharge Diagnoses: Principal Problem:   Status post ankle arthrodesis Active Problems:   Atrial fibrillation (HCC)   Type 2 diabetes mellitus with diabetic neuropathy, without long-term current use of insulin (HCC)   Hypothyroidism   Hypokalemia   Essential hypertension   Anxiety state   Osteoarthritis   Sleep apnea   Major osseous defect   Post-traumatic arthritis of left ankle   Pain due to internal orthopedic prosthetic devices, implants and grafts, initial encounter [facility])   Hospital Course: [name] is a 76 year old female with history of gout, hypothyroid, hyperlipidemia, non-insulin-dependent diabetes mellitus, osteoarthritis, atrial fibrillation, who was taken to the operating room by podiatry for chief concerns of osteomyelitis and underwent ankle transfusion.  Initial vitals prior to procedure showed temperature of 97.3, respiration rate of 18, heart rate of 63, blood pressure 125/68, SPO2 of 96% on room air.  Podiatry service requesting hospital admission for placement.  BMP was ordered and serum sodium showed 141, potassium 3.3, chloride 108, bicarb 23, nonfasting blood glucose 184, BUN of 15, serum creatinine of 0.59, GFR greater than 60.  CBC with differential with unremarkable.  7/29: Pending PT/OT evaluation.  Patient most likely will need placement.  She will be nonweightbearing on left side.  Eliquis is being held until tomorrow, Sunday, 03/05/2022.  7/30: Patient remained stable.  PT is recommending rehab.  Eliquis is being restarted today. Having some right knee pain after working with PT and hopping on 1 leg as  she will remain nonweightbearing on left.  7/31: Patient remained stable.  Had 1 bed offer at [facility], awaiting insurance authorization.  Started getting pain in the left lower extremity as nerve block is now weaning off.  8/1: Remained stable and being discharged to rehab for further management. Patient will continue on current medications follow-up with her providers.  Assessment and Plan: * Status post ankle arthrodesis - Status post left arthroscopic ankle arthrodesis, bone graft, removal of hardware - TOC, PT, OT-recommending SNF - Pain control per podiatry: Oxycodone 5 mg p.o. every 4 hours as needed for moderate pain, oxycodone 10 mg every 4 hours as needed for severe pain - Nonweightbearing on the left side  Atrial fibrillation (HCC) - Holding home Eliquis 5 mg p.o. twice daily until Sunday, 03/05/2022 due to increased risk of bleeding postprocedure - Metoprolol 5 mg IV every 2 hours as needed for heart rate greater than 120, 4 doses ordered  Type 2 diabetes mellitus with diabetic neuropathy, without long-term current use of insulin (HCC) - Resumed home metformin 1000 mg p.o. twice daily - Holding home glipizide and Actos - Insulin SSI with at bedtime coverage ordered - Goal inpatient blood glucose level is 140-180  Hypothyroidism - Continue home Synthroid  Hypokalemia Resolved.  Magnesium at 1.9 -Monitor and replete as needed  Essential hypertension Blood pressure within goal. -Continue home Lasix   Pain control - [facility] Controlled Substance Reporting System database was reviewed. and patient was instructed, not to drive, operate heavy machinery, perform activities at heights, swimming or participation in water activities or provide baby-sitting services while on Pain, Sleep and Anxiety Medications; until their  outpatient Physician has advised to do so again. Also recommended to not to take more than prescribed Pain, Sleep and Anxiety Medications.   Consultants: Podiatry Procedures performed: Left ankle arthrodesis. Disposition: Skilled nursing facility Diet recommendation:  Discharge Diet Orders (From admission, onward)     Start     Ordered   03/07/22 0000  Diet - low sodium heart healthy        03/07/22 1005           Cardiac and Carb modified diet DISCHARGE MEDICATION: Allergies as of 03/07/2022       Reactions   Clarithromycin Nausea And Vomiting, Other (See Comments)   Codeine    Hallucination   Dilaudid  [hydromorphone Hcl] Nausea And Vomiting   Iodine Hives, Itching   Iohexol Hives, Itching   Onion Diarrhea, Nausea And Vomiting   Abdominal pain   Tamiflu  [oseltamivir Phosphate] Nausea And Vomiting   Abdominal Pain,   Zolpidem Nausea And Vomiting, Other (See Comments)   Hallucinations   Bacitracin-neomycin-polymyxin Itching, Rash   Benzalkonium Chloride Itching, Rash, Swelling   Lidocaine Hcl Itching, Rash, Swelling   Morphine Nausea And Vomiting, Rash   Neomycin-bacitracin Zn-polymyx Itching, Rash   Tape Itching, Rash   Adhesive tape - silicone   Tapentadol Rash        Medication List     TAKE these medications    acetaminophen 500 MG tablet Commonly known as: TYLENOL Take 1,000 mg by mouth every 6 (six) hours as needed for mild pain or headache.   albuterol 108 (90 Base) MCG/ACT inhaler Commonly known as: VENTOLIN HFA Inhale 2 puffs into the lungs every 6 (six) hours as needed for shortness of breath.   allopurinol 100 MG tablet Commonly known as: ZYLOPRIM TAKE 1 TABLET BY MOUTH EVERY DAY What changed: when to take this   apixaban 5 MG Tabs tablet Commonly known as: ELIQUIS Take 5 mg by mouth 2 (two) times daily.   atorvastatin 10 MG tablet Commonly known as: LIPITOR TAKE 1 TABLET BY MOUTH EVERYDAY AT BEDTIME   colchicine 0.6 MG tablet Take 0.6 mg by mouth daily as needed (gout).   FISH OIL PO Take 1 capsule by mouth daily.   furosemide 40 MG tablet Commonly known as:  LASIX Take 1 tablet (40 mg total) by mouth daily.   gabapentin 800 MG tablet Commonly known as: NEURONTIN Take 800 mg by mouth 3 (three) times daily.   glimepiride 4 MG tablet Commonly known as: AMARYL TAKE ONE TABLET BY MOUTH EVERY MORNING and TAKE ONE TABLET BY MOUTH EVERYDAY AT BEDTIME   Jardiance 25 MG Tabs tablet Generic drug: empagliflozin TAKE 25 MG BY MOUTH DAILY.   levothyroxine 88 MCG tablet Commonly known as: SYNTHROID TAKE 1 TABLET BY MOUTH EVERY DAY   metFORMIN 1000 MG tablet Commonly known as: GLUCOPHAGE TAKE 1 TABLET BY MOUTH TWICE A DAY   nystatin cream Commonly known as: MYCOSTATIN Apply 1 application topically 2 (two) times daily. What changed:  when to take this reasons to take this   ondansetron 4 MG tablet Commonly known as: ZOFRAN TAKE 1 TABLET BY MOUTH EVERY 8 HOURS AS NEEDED FOR NAUSEA AND VOMITING   Oxycodone HCl 10 MG Tabs Take 1 tablet (10 mg total) by mouth every 4 (four) hours as needed for severe pain.   pioglitazone 30 MG tablet Commonly known as: ACTOS TAKE ONE TABLET BY MOUTH EVERY MORNING   polyethylene glycol 17 g packet Commonly known as: MIRALAX / GLYCOLAX  Take 17 g by mouth 2 (two) times daily as needed for moderate constipation.   Semaglutide (1 MG/DOSE) 4 MG/3ML Sopn Inject 1 mg as directed once a week.   Vitamin D (Ergocalciferol) 1.25 MG (50000 UNIT) Caps capsule Commonly known as: DRISDOL TAKE ONE CAPSULE BY MOUTH ONCE WEEKLY ON 'SUNDAY               Discharge Care Instructions  (From admission, onward)           Start     Ordered   03/07/22 0000  Leave dressing on - Keep it clean, dry, and intact until clinic visit        08'$ /01/23 1005            Follow-up Information     [name], MD. Schedule an appointment as soon as possible for a visit in 1 week(s).   Specialty: Family Medicine Contact information: [address] [place] [address] [place] [address] [phone]          Vickie Epley, MD .   Specialties: Cardiology, Radiology Contact information: [place] [address] [phone]         Criselda Peaches, DPM. Schedule an appointment as soon as possible for a visit in 1 week(s).   Specialty: Podiatry Contact information: [place] [address] [phone]                Discharge Exam: Danley Danker Weights   03/03/22 0852  Weight: 86.2 kg   General.     In no acute distress. Pulmonary.  Lungs clear bilaterally, normal respiratory effort. CV.  Regular rate and rhythm, no JVD, rub or murmur. Abdomen.  Soft, nontender, nondistended, BS positive. CNS.  Alert and oriented .  No focal neurologic deficit. Extremities.  No edema, no cyanosis, pulses intact and symmetrical.  Left ankle with Ace wrap Psychiatry.  Judgment and insight appears normal.   Condition at discharge: stable  The results of significant diagnostics from this hospitalization (including imaging, microbiology, ancillary and laboratory) are listed below for reference.   Imaging Studies: DG Ankle Complete Left  Result Date: 03/03/2022 CLINICAL DATA:  Previous trauma, pain EXAM: LEFT ANKLE COMPLETE - 3+ VIEW COMPARISON:  11/30/2021 FINDINGS: There is interval removal of 2 surgical screws from the medial malleolus. There is interval surgical fusion of tibiotalar joint. Metallic plate and surgical screws in the distal shaft of fibula have not changed. 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 IMPRESSION: There is interval surgical fusion of tibiotalar joint. Electronically Signed   By: Elmer Picker M.D.   On: 03/03/2022 14:58   DG C-Arm 1-60 Min-No Report  Result Date: 03/03/2022 CLINICAL DATA:  Left ankle fusion EXAM: LEFT ANKLE - 2 VIEW; DG C-ARM 1-60 MIN-NO REPORT COMPARISON:  11/30/2021 FINDINGS: Spot fluoroscopic intraoperative views demonstrate crossing  screw fixation of the tibiotalar joint for fusion. Advanced left ankle joint arthropathy. Previous distal fibula ORIF hardware noted. Normal alignment. No acute abnormality. Plantar calcaneal spur noted. IMPRESSION: Limited intraoperative imaging during left ankle fusion/revision. Stable alignment. Electronically Signed   By: Jerilynn Mages.  Shick M.D.   On: 03/03/2022 13:48   DG Ankle 2 Views Left  Result Date: 03/03/2022 CLINICAL DATA:  Left ankle fusion EXAM: LEFT ANKLE - 2 VIEW; DG C-ARM 1-60 MIN-NO REPORT COMPARISON:  11/30/2021 FINDINGS: Spot fluoroscopic intraoperative views demonstrate crossing screw fixation of the tibiotalar joint for  fusion. Advanced left ankle joint arthropathy. Previous distal fibula ORIF hardware noted. Normal alignment. No acute abnormality. Plantar calcaneal spur noted. IMPRESSION: Limited intraoperative imaging during left ankle fusion/revision. Stable alignment. Electronically Signed   By: Jerilynn Mages.  Shick M.D.   On: 03/03/2022 13:48   [place] OR NERVE BLOCK-IMAGE ONLY [facility])  Result Date: 03/03/2022 There is no interpretation for this exam.  This order is for images obtained during a surgical procedure.  Please See "Surgeries" Tab for more information regarding the procedure.    Microbiology: Results for orders placed or performed during the hospital encounter of 06/13/21  Resp Panel by RT-PCR (Flu A&B, Covid) Nasopharyngeal Swab     Status: None   Collection Time: 06/13/21  8:31 PM   Specimen: Nasopharyngeal Swab; Nasopharyngeal(NP) swabs in vial transport medium  Result Value Ref Range Status   SARS Coronavirus 2 by RT PCR NEGATIVE NEGATIVE Final    Comment: (NOTE) SARS-CoV-2 target nucleic acids are NOT DETECTED.  The SARS-CoV-2 RNA is generally detectable in upper respiratory specimens during the acute phase of infection. The lowest concentration of SARS-CoV-2 viral copies this assay can detect is 138 copies/mL. A negative result does not preclude SARS-Cov-2 infection and  should not be used as the sole basis for treatment or other patient management decisions. A negative result may occur with  improper specimen collection/handling, submission of specimen other than nasopharyngeal swab, presence of viral mutation(s) within the areas targeted by this assay, and inadequate number of viral copies(<138 copies/mL). A negative result must be combined with clinical observations, patient history, and epidemiological information. The expected result is Negative.  Fact Sheet for Patients:  EntrepreneurPulse.com.au  Fact Sheet for Healthcare Providers:  IncredibleEmployment.be  This test is no t yet approved or cleared by the [place] FDA and  has been authorized for detection and/or diagnosis of SARS-CoV-2 by FDA under an Emergency Use Authorization (EUA). This EUA will remain  in effect (meaning this test can be used) for the duration of the COVID-19 declaration under Section 564(b)(1) of the Act, 21 U.S.C.section 360bbb-3(b)(1), unless the authorization is terminated  or revoked sooner.       Influenza A by PCR NEGATIVE NEGATIVE Final   Influenza B by PCR NEGATIVE NEGATIVE Final    Comment: (NOTE) The Xpert Xpress SARS-CoV-2/FLU/RSV plus assay is intended as an aid in the diagnosis of influenza from Nasopharyngeal swab specimens and should not be used as a sole basis for treatment. Nasal washings and aspirates are unacceptable for Xpert Xpress SARS-CoV-2/FLU/RSV testing.  Fact Sheet for Patients: EntrepreneurPulse.com.au  Fact Sheet for Healthcare Providers: IncredibleEmployment.be  This test is not yet approved or cleared by the [place] FDA and has been authorized for detection and/or diagnosis of SARS-CoV-2 by FDA under an Emergency Use Authorization (EUA). This EUA will remain in effect (meaning this test can be used) for the duration of the COVID-19 declaration  under Section 564(b)(1) of the Act, 21 U.S.C. section 360bbb-3(b)(1), unless the authorization is terminated or revoked.  Performed at [facility], [address] [place], [place] [address]   Resp Panel by RT-PCR (Flu A&B, Covid) Nasopharyngeal Swab     Status: None   Collection Time: 06/20/21 11:03 AM   Specimen: Nasopharyngeal Swab; Nasopharyngeal(NP) swabs in vial transport medium  Result Value Ref Range Status   SARS Coronavirus 2 by RT PCR NEGATIVE NEGATIVE Final    Comment: (NOTE) SARS-CoV-2 target nucleic acids are NOT DETECTED.  The SARS-CoV-2 RNA is generally detectable in upper  respiratory specimens during the acute phase of infection. The lowest concentration of SARS-CoV-2 viral copies this assay can detect is 138 copies/mL. A negative result does not preclude SARS-Cov-2 infection and should not be used as the sole basis for treatment or other patient management decisions. A negative result may occur with  improper specimen collection/handling, submission of specimen other than nasopharyngeal swab, presence of viral mutation(s) within the areas targeted by this assay, and inadequate number of viral copies(<138 copies/mL). A negative result must be combined with clinical observations, patient history, and epidemiological information. The expected result is Negative.  Fact Sheet for Patients:  EntrepreneurPulse.com.au  Fact Sheet for Healthcare Providers:  IncredibleEmployment.be  This test is no t yet approved or cleared by the [place] FDA and  has been authorized for detection and/or diagnosis of SARS-CoV-2 by FDA under an Emergency Use Authorization (EUA). This EUA will remain  in effect (meaning this test can be used) for the duration of the COVID-19 declaration under Section 564(b)(1) of the Act, 21 U.S.C.section 360bbb-3(b)(1), unless the authorization is terminated  or revoked sooner.       Influenza A by  PCR NEGATIVE NEGATIVE Final   Influenza B by PCR NEGATIVE NEGATIVE Final    Comment: (NOTE) The Xpert Xpress SARS-CoV-2/FLU/RSV plus assay is intended as an aid in the diagnosis of influenza from Nasopharyngeal swab specimens and should not be used as a sole basis for treatment. Nasal washings and aspirates are unacceptable for Xpert Xpress SARS-CoV-2/FLU/RSV testing.  Fact Sheet for Patients: EntrepreneurPulse.com.au  Fact Sheet for Healthcare Providers: IncredibleEmployment.be  This test is not yet approved or cleared by the [place] FDA and has been authorized for detection and/or diagnosis of SARS-CoV-2 by FDA under an Emergency Use Authorization (EUA). This EUA will remain in effect (meaning this test can be used) for the duration of the COVID-19 declaration under Section 564(b)(1) of the Act, 21 U.S.C. section 360bbb-3(b)(1), unless the authorization is terminated or revoked.  Performed at [facility], [address] [place], [place] [address]     Labs: CBC: Recent Labs  Lab 03/03/22 1525 03/04/22 0615 03/07/22 0431  WBC 7.5 11.8* 10.5  NEUTROABS 6.2  --   --   HGB 13.8 13.1 12.5  HCT 42.3 40.7 38.3  MCV 91.2 93.3 91.8  PLT 208 218 027   Basic Metabolic Panel: Recent Labs  Lab 03/03/22 1525 03/03/22 1530 03/04/22 0615  NA 141  --  139  K 3.3*  --  4.2  CL 108  --  108  CO2 23  --  24  GLUCOSE 184*  --  215*  BUN 15  --  16  CREATININE 0.59  --  0.76  CALCIUM 8.5*  --  8.9  MG  --  1.9 1.9   Liver Function Tests: Recent Labs  Lab 03/03/22 1525  AST 21  ALT 19  ALKPHOS 83  BILITOT 0.9  PROT 6.9  ALBUMIN 3.8   CBG: Recent Labs  Lab 03/06/22 1210 03/06/22 1619 03/06/22 2254 03/07/22 0809 03/07/22 0819  GLUCAP 159* 111* 131* 160* 142*    Discharge time spent: greater than 30 minutes.  This record has been created using Systems analyst. Errors have been sought and  corrected,but may not always be located. Such creation errors do not reflect on the standard of care.   Signed: Lorella Nimrod, MD [facility] 03/07/2022

## 2022-03-07 NOTE — Telephone Encounter (Signed)
Per Caryl Pina 's note, [employer] form submitted and new boot dispensed 01/6022

## 2022-03-07 NOTE — Anesthesia Postprocedure Evaluation (Signed)
Anesthesia Post Note  Patient: Teresa [name]  Procedure(s) Performed: ANKLE ARTHROSCOPY (Left: Ankle) BONE GRAFT (Left) POSSIBLE HARDWARE REMOVAL (Left)  Patient location during evaluation: PACU Anesthesia Type: General Level of consciousness: awake and alert Pain management: pain level controlled Vital Signs Assessment: post-procedure vital signs reviewed and stable Respiratory status: spontaneous breathing, nonlabored ventilation and respiratory function stable Cardiovascular status: blood pressure returned to baseline and stable Postop Assessment: no apparent nausea or vomiting Anesthetic complications: no   No notable events documented.   Last Vitals:  Vitals:   03/07/22 0818 03/07/22 1231  BP: 119/66 114/66  Pulse: 75 71  Resp:  16  Temp: 36.6 C 36.9 C  SpO2: 96% 96%    Last Pain:  Vitals:   03/07/22 1231  TempSrc:   PainSc: [place]

## 2022-03-07 NOTE — TOC Progression Note (Addendum)
Transition of Care [facility]) - Progression Note    Patient Details  Name: Teresa [name] MRN: [id number] Date of Birth: [birth year]  Transition of Care [facility]) CM/SW [place], RN Phone Number: 03/07/2022, 8:53 AM  Clinical Narrative:    Ins approved to go to [facility], her  grand daughter will transport Auth number [id number], good for 7 days Going to room 401  Expected Discharge Plan: [place] Barriers to Discharge: Continued Medical Work up  Expected Discharge Plan and Services Expected Discharge Plan: [place]   Discharge Planning Services: CM Consult   Living arrangements for the past 2 months: Apartment                                       Social Determinants of Health (SDOH) Interventions    Readmission Risk Interventions     No data to display

## 2022-03-07 NOTE — Plan of Care (Signed)
  Problem: Coping: Goal: Ability to adjust to condition or change in health will improve Outcome: Progressing   Problem: Fluid Volume: Goal: Ability to maintain a balanced intake and output will improve Outcome: Progressing   Problem: Health Behavior/Discharge Planning: Goal: Ability to identify and utilize available resources and services will improve Outcome: Progressing   Problem: Health Behavior/Discharge Planning: Goal: Ability to manage health-related needs will improve Outcome: Progressing   Problem: Metabolic: Goal: Ability to maintain appropriate glucose levels will improve Outcome: Progressing   Problem: Nutritional: Goal: Maintenance of adequate nutrition will improve Outcome: Progressing   Problem: Skin Integrity: Goal: Risk for impaired skin integrity will decrease Outcome: Progressing   Problem: Tissue Perfusion: Goal: Adequacy of tissue perfusion will improve Outcome: Progressing   Problem: Education: Goal: Knowledge of General Education information will improve Description: Including pain rating scale, medication(s)/side effects and non-pharmacologic comfort measures Outcome: Progressing   Problem: Health Behavior/Discharge Planning: Goal: Ability to manage health-related needs will improve Outcome: Progressing   Problem: Clinical Measurements: Goal: Will remain free from infection Outcome: Progressing   Problem: Clinical Measurements: Goal: Diagnostic test results will improve Outcome: Progressing   Problem: Clinical Measurements: Goal: Respiratory complications will improve Outcome: Progressing   Problem: Clinical Measurements: Goal: Cardiovascular complication will be avoided Outcome: Progressing   Problem: Activity: Goal: Risk for activity intolerance will decrease Outcome: Progressing   Problem: Nutrition: Goal: Adequate nutrition will be maintained Outcome: Progressing   Problem: Coping: Goal: Level of anxiety will  decrease Outcome: Progressing   Problem: Elimination: Goal: Will not experience complications related to bowel motility Outcome: Progressing   Problem: Elimination: Goal: Will not experience complications related to urinary retention Outcome: Progressing   Problem: Pain Managment: Goal: General experience of comfort will improve Outcome: Progressing   Problem: Education: Goal: Required Educational Video(s) Outcome: Progressing   Problem: Clinical Measurements: Goal: Ability to maintain clinical measurements within normal limits will improve Outcome: Progressing   Problem: Clinical Measurements: Goal: Postoperative complications will be avoided or minimized Outcome: Progressing   Problem: Skin Integrity: Goal: Demonstration of wound healing without infection will improve Outcome: Progressing

## 2022-03-08 ENCOUNTER — Ambulatory Visit (INDEPENDENT_AMBULATORY_CARE_PROVIDER_SITE_OTHER): Payer: PPO | Admitting: Podiatry

## 2022-03-08 ENCOUNTER — Telehealth: Payer: Self-pay

## 2022-03-08 ENCOUNTER — Ambulatory Visit (INDEPENDENT_AMBULATORY_CARE_PROVIDER_SITE_OTHER): Payer: PPO

## 2022-03-08 ENCOUNTER — Ambulatory Visit: Payer: PPO

## 2022-03-08 DIAGNOSIS — M19172 Post-traumatic osteoarthritis, left ankle and foot: Secondary | ICD-10-CM

## 2022-03-08 DIAGNOSIS — M79676 Pain in unspecified toe(s): Secondary | ICD-10-CM

## 2022-03-08 DIAGNOSIS — B351 Tinea unguium: Secondary | ICD-10-CM

## 2022-03-08 NOTE — Progress Notes (Signed)
Chronic Care Management Pharmacy Assistant   Name: Teresa [name]  MRN: [id number] DOB: 1945/08/19  Reason for Encounter: Medication Review/Medication Coordination Call.   Recent office visits:  02/22/2022 Dr. Rosanna Randy MD (PCP) No Medication Changes noted,Return in about 4 months  Recent consult visits:  02/15/2022 Lanae Crumbly DPM (Podiatry) No Medication Changes noted 02/03/2022 Dr. Raul Del MD (Pulmonology) Stop Cellcept, start prednisone  Hospital visits:  Medication Reconciliation was completed by comparing discharge summary, patient's EMR and Pharmacy list, and upon discussion with patient.  Admitted to the hospital on 03/03/2022 due to Status Post ankle arthrodesis. Discharge date was 03/07/2022. Discharged from [place]?Medications Started at [facility] Discharge:?? -started Oxycodone 5 mg p.o. every 4 hours as needed for moderate pain, oxycodone 10 mg every 4 hours as needed for severe pain  - Started Metoprolol 5 mg IV every 2 hours as needed for heart rate greater than 120, 4 doses ordered  Medication Changes at Hospital Discharge: -Changed None ID  Medications Discontinued at Hospital Discharge: -Stopped None ID  Medications that remain the same after Hospital Discharge:??  -All other medications will remain the same.    Medications: Outpatient Encounter Medications as of 03/08/2022  Medication Sig Note   acetaminophen (TYLENOL) 500 MG tablet Take 1,000 mg by mouth every 6 (six) hours as needed for mild pain or headache.    albuterol (VENTOLIN HFA) 108 (90 Base) MCG/ACT inhaler Inhale 2 puffs into the lungs every 6 (six) hours as needed for shortness of breath.    allopurinol (ZYLOPRIM) 100 MG tablet TAKE 1 TABLET BY MOUTH EVERY DAY (Patient taking differently: Take 100 mg by mouth at bedtime.)    apixaban (ELIQUIS) 5 MG TABS tablet Take 5 mg by mouth 2 (two) times daily.    atorvastatin (LIPITOR) 10 MG tablet TAKE 1 TABLET BY MOUTH  EVERYDAY AT BEDTIME    colchicine 0.6 MG tablet Take 0.6 mg by mouth daily as needed (gout).    furosemide (LASIX) 40 MG tablet Take 1 tablet (40 mg total) by mouth daily.    gabapentin (NEURONTIN) 800 MG tablet Take 800 mg by mouth 3 (three) times daily.    glimepiride (AMARYL) 4 MG tablet TAKE ONE TABLET BY MOUTH EVERY MORNING and TAKE ONE TABLET BY MOUTH EVERYDAY AT BEDTIME    JARDIANCE 25 MG TABS tablet TAKE 25 MG BY MOUTH DAILY.    levothyroxine (SYNTHROID) 88 MCG tablet TAKE 1 TABLET BY MOUTH EVERY DAY    metFORMIN (GLUCOPHAGE) 1000 MG tablet TAKE 1 TABLET BY MOUTH TWICE A DAY    nystatin cream (MYCOSTATIN) Apply 1 application topically 2 (two) times daily. (Patient taking differently: Apply 1 application  topically 2 (two) times daily as needed.)    Omega-3 Fatty Acids (FISH OIL PO) Take 1 capsule by mouth daily.    ondansetron (ZOFRAN) 4 MG tablet TAKE 1 TABLET BY MOUTH EVERY 8 HOURS AS NEEDED FOR NAUSEA AND VOMITING    oxyCODONE 10 MG TABS Take 1 tablet (10 mg total) by mouth every 4 (four) hours as needed for severe pain.    pioglitazone (ACTOS) 30 MG tablet TAKE ONE TABLET BY MOUTH EVERY MORNING    polyethylene glycol (MIRALAX / GLYCOLAX) 17 g packet Take 17 g by mouth 2 (two) times daily as needed for moderate constipation.    Semaglutide, 1 MG/DOSE, 4 MG/3ML SOPN Inject 1 mg as directed once a week. 03/03/2022: Thursdays   Vitamin D, Ergocalciferol, (DRISDOL) 1.25 MG (  50000 UNIT) CAPS capsule TAKE ONE CAPSULE BY MOUTH ONCE WEEKLY ON SUNDAY (Patient not taking: Reported on 03/03/2022)    No facility-administered encounter medications on file as of 03/08/2022.    Care Gaps: Influenza Vaccine Diabetic Kidney evaluation   Star Rating Drug: Ozempic 0.25-0.5 mg last filled on 01/23/2022 for a 84-day supply with [facility].  Jardiance 25 mg last filled on 09/16/2021 for a 90-Day supply with [facility] Metformin 1000 mg last filled on 08/04/2021 for a 90-Day supply [facility] Glimepiride 4 mg last filled on 12/12/2021 for a 90-Day supply with [facility] Atorvastatin 4 mg last filled on 06/27/2021 for a 90-Day supply with [facility] Pioglitazone 30 mg last filled on 12/12/2021 for a 90-Day supply with [facility]   Medication Fill Gaps: None ID  Reviewed chart for medication changes ahead of medication coordination call.   BP Readings from Last 3 Encounters:  03/07/22 114/66  02/22/22 132/77  01/04/22 116/76    Lab Results  Component Value Date   HGBA1C 7.4 (H) 02/22/2022     Patient obtains medications through Adherence Packaging  90 Days   Last adherence delivery included:  Eliquis 5 mg 1 tablet 2 times daily - Breakfast, Bedtime Furosemide 40 mg 1 tablet daily - Breakfast Glimepiride 4 mg 1 tablet 2 times daily - Breakfast, bedtime Levothyroxine 88 MCG 1 tablet daily - Breakfast Pioglitazone 30 mg 1 tablet daily - Breakfast Freestyle Libre 2 Sensor Change every 14 days Ozempic Inject 1 mg weekly (Thursday)- patient states she does need a refill as she only has one pen left that she will use next Thursday on 12/15/2021 due to the increase dose.  Patient declined medication last month Allopurinol 100 mg 1 tablet daily - Bedtime (Adequate Supply, patient states she has around 30 day supply on hand as of 12/08/2021) Atorvastatin 10 mg 1 tablet  daily - Bedtime  (Adequate Supply, patient states she has around 30 day supply on hand as of 12/08/2021) Gabapentin 800 mg 1 tablet 3 times daily - Breakfast, Lunch, Bedtime  (Adequate Supply, patient states she has around 30 day supply on hand as of 12/08/2021) Jardiance 25 mg 1 tablet daily - Breakfast (Adequate Supply, patient states she has 4 bottles of 30 day supply on hand as of 12/08/2021) Metformin 1000 mg 1 tablet twice daily - Breakfast, Bedtime (Adequate Supply, patient states she has around 30 day supply on hand as of 12/08/2021) Vitamin D 1.25 mg 1 capsule daily for 7 days - (patient  states she has a lab appointment on 12/24/2021 to recheck her Vitamin D, and will be inform if she needs to continue her Vit D or if she can stop.Patient will inform the pharmacy if she needs a refill after her lab work is completed).  Patient is due for next adherence delivery on: 03/20/2022. Called patient and reviewed medications and coordinated delivery.  This delivery to include: None ID  Patient states she is currently in a rehab center where she is recovering from her ankle surgery.Patient reports she is receiving her medications through them at the moment, and should be out in three to four weeks.Patient states to change her next delivery date to 04/20/2022.  Patient declined the following medications  Eliquis 5 mg 1 tablet 2 times daily - Breakfast, Bedtime Furosemide 40 mg 1 tablet daily - Breakfast Glimepiride 4 mg 1 tablet 2 times daily - Breakfast, bedtime Levothyroxine 88 MCG 1 tablet daily - Breakfast Pioglitazone 30 mg 1  tablet daily - Breakfast Freestyle Libre 2 Sensor Change every 14 days Ozempic Inject 1 mg weekly (Thursday) Allopurinol 100 mg 1 tablet daily - Bedtime  Atorvastatin 10 mg 1 tablet  daily - Bedtime   Gabapentin 800 mg 1 tablet 3 times daily - Breakfast, Lunch, Bedtime   Jardiance 25 mg 1 tablet daily - Breakfast Metformin 1000 mg 1 tablet twice daily - Breakfast, Bedtime  Vitamin D 1.25 mg 1 capsule daily for 7 days -   Patient needs refills for None ID.  Patient denied delivery date of 03/20/2022.  [place] Pharmacist Assistant [phone]

## 2022-03-09 ENCOUNTER — Ambulatory Visit: Payer: HMO | Admitting: Podiatry

## 2022-03-09 NOTE — Progress Notes (Signed)
  Subjective:  Patient ID: Teresa [name], female    DOB: [birth year],  MRN: [id number]  Chief Complaint  Patient presents with   Routine Post Op    POV #1 DOS 03/03/2022 FUSION OF LT ANKLE JOINT, BONE GRAFT FROM HEEL & BONE MARROW FROM LEG, POSS HARDWARE REMOVAL     76 y.o. female returns for post-op check.  She is doing well she still having quite a bit of pain and taking oxycodone routinely  Review of Systems: Negative except as noted in the HPI. Denies N/V/F/Ch.   Objective:  There were no vitals filed for this visit. There is no height or weight on file to calculate BMI. Constitutional Well developed. Well nourished.  Vascular Foot warm and well perfused. Capillary refill normal to all digits.  Calf is soft and supple, no posterior calf or knee pain, negative Homans' sign  Neurologic Normal speech. Oriented to person, place, and time. Epicritic sensation to light touch grossly present bilaterally.  Dermatologic Skin healing well without signs of infection. Skin edges well coapted without signs of infection.  Orthopedic: Tenderness to palpation noted about the surgical site.  Moderate edema   Multiple view plain film radiographs: Status post 3 screw arthrodesis of the ankle, films equivalent to immediate postop films, no complication noted Assessment:   1. Post-traumatic arthritis of left ankle    Plan:  Patient was evaluated and treated and all questions answered.  S/p ankle surgery left -Progressing as expected post-operatively. -XR: Noted above -WB Status: NWB in posterior splint which was reapplied -Sutures: Plan to remove in 2 weeks. -Medications: No refills required she will continue her Eliquis that she already takes -Foot redressed.   She had an upcoming appointment with Dr. Prudence Davidson this week, nails were debrided in length and thickness utilizing sharp nail nipper so that she does not have to make a return trip to the office  No follow-ups on file.

## 2022-03-16 ENCOUNTER — Telehealth: Payer: Self-pay | Admitting: Podiatry

## 2022-03-16 NOTE — Telephone Encounter (Signed)
FYI Received call from pts nursing facility and the nurse told them to call us because the wrap the was put on last week on her surgical foot needs to be redone as pt is having unbearable pain and swelling going into the ankle.  I added pt to Dr Amalia Hailey schedule tomorrow ok per Barbaraann Rondo. I did not cxl the follow up with you on 8.16.

## 2022-03-17 ENCOUNTER — Ambulatory Visit (INDEPENDENT_AMBULATORY_CARE_PROVIDER_SITE_OTHER): Payer: PPO | Admitting: Podiatry

## 2022-03-17 DIAGNOSIS — Z9889 Other specified postprocedural states: Secondary | ICD-10-CM

## 2022-03-17 NOTE — Progress Notes (Signed)
Chief Complaint  Patient presents with   Follow-up    Follow-up    Subjective:  Patient presents today status post LT ankle fusion, bone graft from heel, bone marrow aspirate from leg. DOS: 03/03/2022.  Patient states that last night she began to experience significant pain and tenderness especially to the plantar aspect of the heel.  She says she has a sharp shooting sensation on her heel.  Denies injuring her foot or tripping and falling.  Patient came in today as an urgent work in.  Past Medical History:  Diagnosis Date   Anginal pain ([place])    Aortic atherosclerosis ([place])    Arthritis    Asthma    Atrial fibrillation ([place])    a.) CHA2DS2-VASc = 7 (age x2, sex, CHF, HTN, vascular disease history, T2DM);  b.) rate/rhythm maintained without pharmacological intervention; chronically anticoagulated using standard dose apixaban   Bilateral lower extremity edema    CHF (congestive heart failure) ([place])    Complication of anesthesia    a.) PONV   COPD (chronic obstructive pulmonary disease) ([place])    Coronary artery disease    a.) cCTA 11/01/2021: Ca+ score 174 (67th percentile for age/sex match) --> FFR demonstrated no hemodynamically significant stenosis   Depression    DOE (dyspnea on exertion)    GERD (gastroesophageal reflux disease)    Gout    Heart murmur    History of 2019 novel coronavirus disease (COVID-19) 05/12/2019   History of hiatal hernia    History of kidney stones    History of orthopnea    HLD (hyperlipidemia)    Hypertension    Hypothyroidism    Long term current use of anticoagulant    a.) apixaban   Migraines    Mobitz type I Wenckebach atrioventricular block    a.) holter study 01/27/2022   Palpitations    PONV (postoperative nausea and vomiting)    PSVT (paroxysmal supraventricular tachycardia) ([place]) 01/27/2022   a.) holter study 01/27/2022 - longest lasting 17 beats at a rate of 109 bpm   Pulmonary HTN ([facility])    RBBB (right bundle branch block)     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   Sleep apnea    a.) does not require nocturnal PAP therapy   T2DM (type 2 diabetes mellitus) ([place])    Vertigo    Vitamin D deficiency     Past Surgical History:  Procedure Laterality Date   ABDOMINAL HYSTERECTOMY     ovaries intact   ANKLE ARTHROSCOPY Left 03/03/2022   Procedure: ANKLE ARTHROSCOPY;  Surgeon: Criselda Peaches, DPM;  Location: [facility] OR[facility];  Service: Podiatry;  Laterality: Left;  GENERAL WITH POP AND SAPHENOUS BLOCK   BLADDER SURGERY     bladder tuck   BREAST BIOPSY Right 03/20/2016   neg x 2 area   CARDIAC CATHETERIZATION     CARPAL TUNNEL RELEASE     CATARACT EXTRACTION W/PHACO Left 09/17/2018   Procedure: CATARACT EXTRACTION PHACO AND INTRAOCULAR LENS PLACEMENT (IOC) LEFT, DIABETIC;  Surgeon: Birder Robson, MD;  Location: [facility] OR[facility];  Service: Ophthalmology;  Laterality: Left;  [place] 00:34 CDE 4.85 Fluid pack lot # [id number]   CATARACT EXTRACTION W/PHACO Right 10/15/2018   Procedure: CATARACT EXTRACTION PHACO AND INTRAOCULAR LENS PLACEMENT (IOC)-RIGHT;  Surgeon: Birder Robson, MD;  Location: [facility];  Service: Ophthalmology;  Laterality: Right;  [place] 00:27.6 CDE 3.43 Fluid Pack Lot # [id number]   COLONOSCOPY WITH PROPOFOL N/A 09/27/2018   Procedure: COLONOSCOPY WITH PROPOFOL;  Surgeon: Bonna Gains,  Lennette Bihari, MD;  Location: [facility];  Service: Endoscopy;  Laterality: N/A;   DILATION AND CURETTAGE OF UTERUS     EYE SURGERY     eyelid   FRACTURE SURGERY     left ankle-plate and screws palced   GRAFT APPLICATION Left 1/61/0960   Procedure: BONE GRAFT;  Surgeon: Criselda Peaches, DPM;  Location: [facility] OR[facility];  Service: Podiatry;  Laterality: Left;   HARDWARE REMOVAL Left 03/03/2022   Procedure: POSSIBLE HARDWARE REMOVAL;  Surgeon: Criselda Peaches, DPM;  Location: [facility] OR[facility];  Service: Podiatry;  Laterality: Left;   LITHOTRIPSY     NISSEN FUNDOPLICATION     SHOULDER SURGERY Right    rotator cuff   TONSILLECTOMY      Allergies  Allergen  Reactions   Clarithromycin Nausea And Vomiting and Other (See Comments)   Codeine     Hallucination   Dilaudid  [Hydromorphone Hcl] Nausea And Vomiting   Iodine Hives and Itching   Iohexol Hives and Itching   Onion Diarrhea and Nausea And Vomiting    Abdominal pain   Tamiflu  [Oseltamivir Phosphate] Nausea And Vomiting    Abdominal Pain,   Zolpidem Nausea And Vomiting and Other (See Comments)    Hallucinations   Bacitracin-Neomycin-Polymyxin Itching and Rash   Benzalkonium Chloride Itching, Rash and Swelling   Lidocaine Hcl Itching, Rash and Swelling   Morphine Nausea And Vomiting and Rash   Neomycin-Bacitracin Zn-Polymyx Itching and Rash   Tape Itching and Rash    Adhesive tape - silicone   Tapentadol Rash    Objective/Physical Exam Neurovascular status intact.  There is some moderate edema throughout the foot and ankle.  No erythema.  Clinically there is no indication of infection.  All skin incisions are well coapted and sutures intact with no drainage.  Assessment: 1. s/p LT ankle arthrodesis w/ bone graft from heel, BMA from leg. DOS: 03/03/2022   Plan of Care:  1. Patient was evaluated.  2.  Clinically the foot appears very stable with good routine healing 3.  Dressings were reapplied to the surgical extremity and reapplication of posterior splint 4.  Continue strict NWB and posterior splint 5.  The suture to the most proximal tibial tuberosity was removed today.  Remaining sutures left intact 6.  Return to clinic neck scheduled appoint with Dr. Merri Brunette, DPM [facility]  Dr. Edrick Kins, DPM    [address], [place] [address]                Office [phone]  Fax [phone]

## 2022-03-22 ENCOUNTER — Encounter: Payer: PPO | Admitting: Podiatry

## 2022-03-22 ENCOUNTER — Ambulatory Visit (INDEPENDENT_AMBULATORY_CARE_PROVIDER_SITE_OTHER): Payer: HMO | Admitting: Podiatry

## 2022-03-22 DIAGNOSIS — M19172 Post-traumatic osteoarthritis, left ankle and foot: Secondary | ICD-10-CM

## 2022-03-24 ENCOUNTER — Telehealth: Payer: Self-pay

## 2022-03-24 NOTE — Progress Notes (Signed)
  Subjective:  Patient ID: Teresa [name], female    DOB: July 31, 1946,  MRN: [id number]  Chief Complaint  Patient presents with   Routine Post Op      POV #2 DOS 03/03/2022 FUSION OF LT ANKLE JOINT, BONE GRAFT FROM HEEL & BONE MARROW FROM LEG, POSS HARDWARE REMOVAL     76 y.o. female returns for post-op check.  She is doing well s  Review of Systems: Negative except as noted in the HPI. Denies N/V/F/Ch.   Objective:  There were no vitals filed for this visit. There is no height or weight on file to calculate BMI. Constitutional Well developed. Well nourished.  Vascular Foot warm and well perfused. Capillary refill normal to all digits.  Calf is soft and supple, no posterior calf or knee pain, negative Homans' sign  Neurologic Normal speech. Oriented to person, place, and time. Epicritic sensation to light touch grossly present bilaterally.  Dermatologic Skin healing well without signs of infection. Skin edges well coapted without signs of infection.  Orthopedic: Tenderness to palpation noted about the surgical site.  Moderate edema   Multiple view plain film radiographs: Status post 3 screw arthrodesis of the ankle, films equivalent to immediate postop films, no complication noted Assessment:   1. Post-traumatic arthritis of left ankle    Plan:  Patient was evaluated and treated and all questions answered.  S/p ankle surgery left -sutures removed today -Continue NWB LLE -Well padded below knee cast was applied -Return 6 weeks for new xrays, cast removal, transition to boot and WB   Return in about 6 weeks (around 05/03/2022) for post op (new x-rays), cast removal .

## 2022-03-24 NOTE — Progress Notes (Signed)
Chronic Care Management APPOINTMENT REMINDER   Called Teresa [name], No answer, left message of appointment on 03/27/2022 at 8:30 a, via office visit with [name] , Pharm D. Notified to [place] ,and have all medications, supplements, blood pressure and/or blood sugar logs available during appointment and to return call if need to reschedule.  [place] Pharmacist Assistant [phone]

## 2022-03-24 NOTE — Progress Notes (Signed)
Patient return my call, and requested to cancel her appointment since she is still in rehab.Patient states she will call me back and reschedule at another time.  [place] Pharmacist Assistant [phone]

## 2022-03-27 ENCOUNTER — Ambulatory Visit: Payer: HMO

## 2022-03-28 ENCOUNTER — Telehealth: Payer: Self-pay | Admitting: Family Medicine

## 2022-03-28 NOTE — Telephone Encounter (Signed)
Merleen Nicely calling from [place] contact: [phone] Wants to know if PCP will follow for orders, please advise

## 2022-04-04 ENCOUNTER — Encounter: Payer: Self-pay | Admitting: Family Medicine

## 2022-04-04 ENCOUNTER — Ambulatory Visit (INDEPENDENT_AMBULATORY_CARE_PROVIDER_SITE_OTHER): Payer: HMO | Admitting: Family Medicine

## 2022-04-04 VITALS — BP 112/77 | HR 69 | Resp 16

## 2022-04-04 DIAGNOSIS — G63 Polyneuropathy in diseases classified elsewhere: Secondary | ICD-10-CM | POA: Diagnosis not present

## 2022-04-04 DIAGNOSIS — Z981 Arthrodesis status: Secondary | ICD-10-CM

## 2022-04-04 DIAGNOSIS — Z6836 Body mass index (BMI) 36.0-36.9, adult: Secondary | ICD-10-CM

## 2022-04-04 DIAGNOSIS — I10 Essential (primary) hypertension: Secondary | ICD-10-CM

## 2022-04-04 DIAGNOSIS — I5032 Chronic diastolic (congestive) heart failure: Secondary | ICD-10-CM | POA: Diagnosis not present

## 2022-04-04 DIAGNOSIS — E039 Hypothyroidism, unspecified: Secondary | ICD-10-CM | POA: Diagnosis not present

## 2022-04-04 DIAGNOSIS — E118 Type 2 diabetes mellitus with unspecified complications: Secondary | ICD-10-CM | POA: Diagnosis not present

## 2022-04-04 DIAGNOSIS — I4891 Unspecified atrial fibrillation: Secondary | ICD-10-CM

## 2022-04-04 NOTE — Patient Instructions (Signed)
STOP GLIMEPIRIDE.

## 2022-04-04 NOTE — Progress Notes (Unsigned)
Established patient visit  I,[name],acting as a scribe for Wilhemena Durie, MD.,have documented all relevant documentation on the behalf of Wilhemena Durie, MD,as directed by  Wilhemena Durie, MD while in the presence of Wilhemena Durie, MD.   Patient: Teresa [name]   DOB: 1946-04-13   76 y.o. Female  MRN: [id number] Visit Date: 04/04/2022  Today's healthcare provider: Wilhemena Durie, MD   Chief Complaint  Patient presents with  . Hospitalization Follow-up   Subjective    HPI  Patient comes in the day after ankle reconstruction.  She had to go to rehab afterwards.  She has had no falls.  Splint caused an  ulcer but she is now in a cast. She has had some recent hypoglycemia.  Otherwise she states she is doing okay on her medications. Follow up Hospitalization  Patient was admitted to [facility] on 03/03/2022 and discharged on 03/07/2022. She was treated for Status post ankle arthrodesis. Treatment for this included see notes in chart. Telephone follow up was done on good She reports good compliance with treatment. She reports this condition is improved.  ----------------------------------------------------------------------------------------- -   Medications: Outpatient Medications Prior to Visit  Medication Sig  . acetaminophen (TYLENOL) 500 MG tablet Take 1,000 mg by mouth every 6 (six) hours as needed for mild pain or headache.  . albuterol (VENTOLIN HFA) 108 (90 Base) MCG/ACT inhaler Inhale 2 puffs into the lungs every 6 (six) hours as needed for shortness of breath.  . allopurinol (ZYLOPRIM) 100 MG tablet TAKE 1 TABLET BY MOUTH EVERY DAY (Patient taking differently: Take 100 mg by mouth at bedtime.)  . apixaban (ELIQUIS) 5 MG TABS tablet Take 5 mg by mouth 2 (two) times daily.  [place] Kitchen atorvastatin (LIPITOR) 10 MG tablet TAKE 1 TABLET BY MOUTH EVERYDAY AT BEDTIME  . colchicine 0.6 MG tablet Take 0.6 mg by mouth daily as needed (gout).  . furosemide (LASIX)  40 MG tablet Take 1 tablet (40 mg total) by mouth daily.  [place] Kitchen gabapentin (NEURONTIN) 800 MG tablet Take 800 mg by mouth 3 (three) times daily.  [place] Kitchen glimepiride (AMARYL) 4 MG tablet TAKE ONE TABLET BY MOUTH EVERY MORNING and TAKE ONE TABLET BY MOUTH EVERYDAY AT BEDTIME  . JARDIANCE 25 MG TABS tablet TAKE 25 MG BY MOUTH DAILY.  [place] Kitchen levothyroxine (SYNTHROID) 88 MCG tablet TAKE 1 TABLET BY MOUTH EVERY DAY  . metFORMIN (GLUCOPHAGE) 1000 MG tablet TAKE 1 TABLET BY MOUTH TWICE A DAY  . nystatin cream (MYCOSTATIN) Apply 1 application topically 2 (two) times daily. (Patient taking differently: Apply 1 application  topically 2 (two) times daily as needed.)  . Omega-3 Fatty Acids (FISH OIL PO) Take 1 capsule by mouth daily.  . ondansetron (ZOFRAN) 4 MG tablet TAKE 1 TABLET BY MOUTH EVERY 8 HOURS AS NEEDED FOR NAUSEA AND VOMITING  . oxyCODONE 10 MG TABS Take 1 tablet (10 mg total) by mouth every 4 (four) hours as needed for severe pain.  . pioglitazone (ACTOS) 30 MG tablet TAKE ONE TABLET BY MOUTH EVERY MORNING  . Semaglutide, 1 MG/DOSE, 4 MG/3ML SOPN Inject 1 mg as directed once a week.  . Vitamin D, Ergocalciferol, (DRISDOL) 1.25 MG (50000 UNIT) CAPS capsule TAKE ONE CAPSULE BY MOUTH ONCE WEEKLY ON SUNDAY  . [DISCONTINUED] polyethylene glycol (MIRALAX / GLYCOLAX) 17 g packet Take 17 g by mouth 2 (two) times daily as needed for moderate constipation.   No facility-administered medications prior to visit.  Review of Systems  Constitutional:  Negative for appetite change, chills, fatigue and fever.  Respiratory:  Negative for chest tightness and shortness of breath.   Cardiovascular:  Negative for chest pain and palpitations.  Gastrointestinal:  Negative for abdominal pain, nausea and vomiting.  Neurological:  Negative for dizziness and weakness.    Last hemoglobin A1c Lab Results  Component Value Date   HGBA1C 7.4 (H) 02/22/2022       Objective    BP 112/77 (BP Location: Right Arm, Patient  Position: Sitting, Cuff Size: Normal)   Pulse 69   Resp 16   SpO2 97%  BP Readings from Last 3 Encounters:  04/04/22 112/77  03/07/22 114/66  02/22/22 132/77   Wt Readings from Last 3 Encounters:  03/03/22 190 lb (86.2 kg)  02/22/22 188 lb (85.3 kg)  02/21/22 188 lb (85.3 kg)      Physical Exam Vitals reviewed.  Constitutional:      General: She is not in acute distress.    Appearance: She is well-developed.  HENT:     Head: Normocephalic and atraumatic.     Right Ear: Hearing normal.     Left Ear: Hearing normal.     Nose: Nose normal.  Eyes:     General: Lids are normal. No scleral icterus.       Right eye: No discharge.        Left eye: No discharge.     Conjunctiva/sclera: Conjunctivae normal.  Cardiovascular:     Rate and Rhythm: Normal rate and regular rhythm.     Heart sounds: Normal heart sounds.  Pulmonary:     Effort: Pulmonary effort is normal. No respiratory distress.  Musculoskeletal:     Comments: Cast on left foot and ankle  Skin:    Findings: No lesion or rash.  Neurological:     General: No focal deficit present.     Mental Status: She is alert and oriented to person, place, and time.  Psychiatric:        Mood and Affect: Mood normal.        Speech: Speech normal.        Behavior: Behavior normal.        Thought Content: Thought content normal.        Judgment: Judgment normal.      No results found for any visits on 04/04/22.  Assessment & Plan     ***  No follow-ups on file.      {provider attestation***:1}   Wilhemena Durie, MD  [facility] [phone] (phone) [phone] (fax)  [place]

## 2022-04-06 ENCOUNTER — Telehealth: Payer: Self-pay

## 2022-04-06 NOTE — Progress Notes (Unsigned)
Chronic Care Management Pharmacy Assistant   Name: Teresa [name]  MRN: [id number] DOB: 01-19-1946  Reason for Encounter: Medication Review/Medication Coordination Call.   Recent office visits:  04/04/2022 Dr. Rosanna Randy MD (PCP) Stop Glimepiride.  Recent consult visits:  03/22/2022 Lanae Crumbly DPM (Podiatry) No Medication Changes noted 03/17/2022 Daylene Katayama DPM (Podiatry) No medication Changes noted 03/08/2022 Lanae Crumbly DPM (Podiatry) No Medication Changes noted  Hospital visits:  None in previous 6 months  Medications: Outpatient Encounter Medications as of 04/06/2022  Medication Sig Note   acetaminophen (TYLENOL) 500 MG tablet Take 1,000 mg by mouth every 6 (six) hours as needed for mild pain or headache.    albuterol (VENTOLIN HFA) 108 (90 Base) MCG/ACT inhaler Inhale 2 puffs into the lungs every 6 (six) hours as needed for shortness of breath.    allopurinol (ZYLOPRIM) 100 MG tablet TAKE 1 TABLET BY MOUTH EVERY DAY (Patient taking differently: Take 100 mg by mouth at bedtime.)    apixaban (ELIQUIS) 5 MG TABS tablet Take 5 mg by mouth 2 (two) times daily.    atorvastatin (LIPITOR) 10 MG tablet TAKE 1 TABLET BY MOUTH EVERYDAY AT BEDTIME    colchicine 0.6 MG tablet Take 0.6 mg by mouth daily as needed (gout).    furosemide (LASIX) 40 MG tablet Take 1 tablet (40 mg total) by mouth daily.    gabapentin (NEURONTIN) 800 MG tablet Take 800 mg by mouth 3 (three) times daily.    glimepiride (AMARYL) 4 MG tablet TAKE ONE TABLET BY MOUTH EVERY MORNING and TAKE ONE TABLET BY MOUTH EVERYDAY AT BEDTIME    JARDIANCE 25 MG TABS tablet TAKE 25 MG BY MOUTH DAILY.    levothyroxine (SYNTHROID) 88 MCG tablet TAKE 1 TABLET BY MOUTH EVERY DAY    metFORMIN (GLUCOPHAGE) 1000 MG tablet TAKE 1 TABLET BY MOUTH TWICE A DAY    nystatin cream (MYCOSTATIN) Apply 1 application topically 2 (two) times daily. (Patient taking differently: Apply 1 application  topically 2 (two) times daily as needed.)     Omega-3 Fatty Acids (FISH OIL PO) Take 1 capsule by mouth daily.    ondansetron (ZOFRAN) 4 MG tablet TAKE 1 TABLET BY MOUTH EVERY 8 HOURS AS NEEDED FOR NAUSEA AND VOMITING    oxyCODONE 10 MG TABS Take 1 tablet (10 mg total) by mouth every 4 (four) hours as needed for severe pain.    pioglitazone (ACTOS) 30 MG tablet TAKE ONE TABLET BY MOUTH EVERY MORNING    Semaglutide, 1 MG/DOSE, 4 MG/3ML SOPN Inject 1 mg as directed once a week. 03/03/2022: Thursdays   Vitamin D, Ergocalciferol, (DRISDOL) 1.25 MG (50000 UNIT) CAPS capsule TAKE ONE CAPSULE BY MOUTH ONCE WEEKLY ON SUNDAY    No facility-administered encounter medications on file as of 04/06/2022.    Care Gaps: Influenza Vaccine Diabetic Kidney evaluation COVID-19 Vaccine   Star Rating Drug: Ozempic 0.25-0.5 mg last filled on 01/23/2022 for a 84-day supply with [facility].  Jardiance 25 mg last filled on 09/16/2021 for a 90-Day supply with [facility] Metformin 1000 mg last filled on 08/04/2021 for a 90-Day supply with [facility] Atorvastatin 4 mg last filled on 06/27/2021 for a 90-Day supply with [facility] Pioglitazone 30 mg last filled on 12/12/2021 for a 90-Day supply with [facility]   Medication Fill Gaps: None ID  Reviewed chart for medication changes ahead of medication coordination call.  BP Readings from Last 3 Encounters:  04/04/22 112/77  03/07/22 114/66  02/22/22 132/77  Lab Results  Component Value Date   HGBA1C 7.4 (H) 02/22/2022     Patient obtains medications through Adherence Packaging  90 Days   Last adherence delivery included:  None ID  Patient states she is currently in a rehab center where she is recovering from her ankle surgery.Patient reports she is receiving her medications through them at the moment, and should be out in three to four weeks.Patient states to change her next delivery date to 04/20/2022.   Patient declined the following medications last month:  Eliquis 5 mg 1 tablet 2 times  daily - Breakfast, Bedtime Furosemide 40 mg 1 tablet daily - Breakfast Glimepiride 4 mg 1 tablet 2 times daily - Breakfast, bedtime Levothyroxine 88 MCG 1 tablet daily - Breakfast Pioglitazone 30 mg 1 tablet daily - Breakfast Freestyle Libre 2 Sensor Change every 14 days Ozempic Inject 1 mg weekly (Thursday) Allopurinol 100 mg 1 tablet daily - Bedtime  Atorvastatin 10 mg 1 tablet  daily - Bedtime   Gabapentin 800 mg 1 tablet 3 times daily - Breakfast, Lunch, Bedtime  Jardiance 25 mg 1 tablet daily - Breakfast Metformin 1000 mg 1 tablet twice daily - Breakfast, Bedtime  Vitamin D 1.25 mg 1 capsule weekly on Sunday- Breakfast   Patient is due for next adherence delivery on: 04/19/2022. Called patient and reviewed medications and coordinated delivery.  This delivery to include: Patient will need a short fill of (med), prior to adherence delivery. (To align with sync date or if PRN med)  Coordinated acute fill for (med) to be delivered (date).  Patient declined the following medications (meds) due to (reason)  Patient needs refills for ***.  Confirmed delivery date of ***, advised patient that pharmacy will contact them the morning of delivery.  [place] Pharmacist Assistant [phone]

## 2022-04-11 NOTE — Progress Notes (Unsigned)
Electrophysiology Office Follow up Visit Note:    Date:  04/12/2022   ID:  Teresa [name], DOB 12-31-1945, MRN [id number]  PCP:  [name], MD  [facility] Cardiologist:  None  [facility] Electrophysiologist:  Vickie Epley, MD    Interval History:    Teresa [name] is a 76 y.o. female who presents for a follow up visit. They were last seen in clinic Jan 04, 2022 for paroxysmal atrial fibrillation.  She has had symptomatic episodes of atrial fibrillation that thankfully are very rare.  At the last visit it was unclear what the burden of her atrial fibrillation truly was so a heart monitor was ordered.  She is doing well after her orthopedic surgery.  He has a boot on her left lower extremity.  She thinks the cast will come off later this month.  No A-fib that she is aware of.  Continues to take blood thinner without bleeding issues.     Past Medical History:  Diagnosis Date   Anginal pain ([place])    Aortic atherosclerosis ([place])    Arthritis    Asthma    Atrial fibrillation ([place])    a.) CHA2DS2-VASc = 7 (age x2, sex, CHF, HTN, vascular disease history, T2DM);  b.) rate/rhythm maintained without pharmacological intervention; chronically anticoagulated using standard dose apixaban   Bilateral lower extremity edema    CHF (congestive heart failure) ([place])    Complication of anesthesia    a.) PONV   COPD (chronic obstructive pulmonary disease) ([place])    Coronary artery disease    a.) cCTA 11/01/2021: Ca+ score 174 (67th percentile for age/sex match) --> FFR demonstrated no hemodynamically significant stenosis   Depression    DOE (dyspnea on exertion)    GERD (gastroesophageal reflux disease)    Gout    Heart murmur    History of 2019 novel coronavirus disease (COVID-19) 05/12/2019   History of hiatal hernia    History of kidney stones    History of orthopnea    HLD (hyperlipidemia)    Hypertension    Hypothyroidism    Long term current use of anticoagulant     a.) apixaban   Migraines    Mobitz type I Wenckebach atrioventricular block    a.) holter study 01/27/2022   Palpitations    PONV (postoperative nausea and vomiting)    PSVT (paroxysmal supraventricular tachycardia) ([place]) 01/27/2022   a.) holter study 01/27/2022 - longest lasting 17 beats at a rate of 109 bpm   Pulmonary HTN (HCC)    RBBB (right bundle branch block)    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   Sleep apnea    a.) does not require nocturnal PAP therapy   T2DM (type 2 diabetes mellitus) ([place])    Vertigo    Vitamin D deficiency     Past Surgical History:  Procedure Laterality Date   ABDOMINAL HYSTERECTOMY     ovaries intact   ANKLE ARTHROSCOPY Left 03/03/2022   Procedure: ANKLE ARTHROSCOPY;  Surgeon: Criselda Peaches, DPM;  Location: [facility];  Service: Podiatry;  Laterality: Left;  GENERAL WITH POP AND SAPHENOUS BLOCK   BLADDER SURGERY     bladder tuck   BREAST BIOPSY Right 03/20/2016   neg x 2 area   CARDIAC CATHETERIZATION     CARPAL TUNNEL RELEASE     CATARACT EXTRACTION W/PHACO Left 09/17/2018   Procedure: CATARACT EXTRACTION PHACO AND INTRAOCULAR LENS PLACEMENT (IOC) LEFT, DIABETIC;  Surgeon: Birder Robson, MD;  Location: [facility];  Service: Ophthalmology;  Laterality: Left;  [place] 00:34 CDE 4.85 Fluid pack lot # [id number]   CATARACT EXTRACTION W/PHACO Right 10/15/2018   Procedure: CATARACT EXTRACTION PHACO AND INTRAOCULAR LENS PLACEMENT (IOC)-RIGHT;  Surgeon: Birder Robson, MD;  Location: [facility];  Service: Ophthalmology;  Laterality: Right;  [place] 00:27.6 CDE 3.43 Fluid Pack Lot # [id number]   COLONOSCOPY WITH PROPOFOL N/A 09/27/2018   Procedure: COLONOSCOPY WITH PROPOFOL;  Surgeon: Virgel Manifold, MD;  Location: [facility];  Service: Endoscopy;  Laterality: N/A;   DILATION AND CURETTAGE OF UTERUS     EYE SURGERY     eyelid   FRACTURE SURGERY     left ankle-plate and screws palced   GRAFT APPLICATION Left 1/57/2620   Procedure: BONE GRAFT;  Surgeon:  Criselda Peaches, DPM;  Location: [facility];  Service: Podiatry;  Laterality: Left;   HARDWARE REMOVAL Left 03/03/2022   Procedure: POSSIBLE HARDWARE REMOVAL;  Surgeon: Criselda Peaches, DPM;  Location: [facility];  Service: Podiatry;  Laterality: Left;   LITHOTRIPSY     NISSEN FUNDOPLICATION     SHOULDER SURGERY Right    rotator cuff   TONSILLECTOMY      Current Medications: Current Meds  Medication Sig   acetaminophen (TYLENOL) 500 MG tablet Take 1,000 mg by mouth every 6 (six) hours as needed for mild pain or headache.   albuterol (VENTOLIN HFA) 108 (90 Base) MCG/ACT inhaler Inhale 2 puffs into the lungs every 6 (six) hours as needed for shortness of breath.   allopurinol (ZYLOPRIM) 100 MG tablet TAKE 1 TABLET BY MOUTH EVERY DAY   apixaban (ELIQUIS) 5 MG TABS tablet Take 5 mg by mouth 2 (two) times daily.   atorvastatin (LIPITOR) 10 MG tablet TAKE 1 TABLET BY MOUTH EVERYDAY AT BEDTIME   colchicine 0.6 MG tablet Take 0.6 mg by mouth daily as needed (gout).   furosemide (LASIX) 40 MG tablet Take 1 tablet (40 mg total) by mouth daily.   gabapentin (NEURONTIN) 800 MG tablet Take 800 mg by mouth 3 (three) times daily.   glimepiride (AMARYL) 4 MG tablet TAKE ONE TABLET BY MOUTH EVERY MORNING and TAKE ONE TABLET BY MOUTH EVERYDAY AT BEDTIME   JARDIANCE 25 MG TABS tablet TAKE 25 MG BY MOUTH DAILY.   levothyroxine (SYNTHROID) 88 MCG tablet TAKE 1 TABLET BY MOUTH EVERY DAY   metFORMIN (GLUCOPHAGE) 1000 MG tablet TAKE 1 TABLET BY MOUTH TWICE A DAY   nystatin cream (MYCOSTATIN) Apply 1 application topically 2 (two) times daily.   Omega-3 Fatty Acids (FISH OIL PO) Take 1 capsule by mouth daily.   ondansetron (ZOFRAN) 4 MG tablet TAKE 1 TABLET BY MOUTH EVERY 8 HOURS AS NEEDED FOR NAUSEA AND VOMITING   oxyCODONE 10 MG TABS Take 1 tablet (10 mg total) by mouth every 4 (four) hours as needed for severe pain.   pioglitazone (ACTOS) 30 MG tablet TAKE ONE TABLET BY MOUTH EVERY MORNING   Semaglutide, 1  MG/DOSE, 4 MG/3ML SOPN Inject 1 mg as directed once a week.   Vitamin D, Ergocalciferol, (DRISDOL) 1.25 MG (50000 UNIT) CAPS capsule TAKE ONE CAPSULE BY MOUTH ONCE WEEKLY ON SUNDAY     Allergies:   Clarithromycin, Codeine, Dilaudid  [hydromorphone hcl], Iodine, Iohexol, Onion, Tamiflu  [oseltamivir phosphate], Zolpidem, Bacitracin-neomycin-polymyxin, Benzalkonium chloride, Lidocaine hcl, Morphine, Neomycin-bacitracin zn-polymyx, Tape, and Tapentadol   Social History   Socioeconomic History   Marital status: Divorced    Spouse name: Not on file   Number  of children: 2   Years of education: Not on file   Highest education level: Associate degree: occupational, Hotel manager, or vocational program  Occupational History   Occupation: retired  Tobacco Use   Smoking status: Never   Smokeless tobacco: Never  Vaping Use   Vaping Use: Never used  Substance and Sexual Activity   Alcohol use: Never   Drug use: Never   Sexual activity: Not Currently  Other Topics Concern   Not on file  Social History Narrative   Not on file   Social Determinants of Health   Financial Resource Strain: Low Risk  (09/19/2021)   Overall Financial Resource Strain (CARDIA)    Difficulty of Paying Living Expenses: Not hard at all  Recent Concern: Financial Resource Strain - Medium Risk (08/09/2021)   Overall Financial Resource Strain (CARDIA)    Difficulty of Paying Living Expenses: Somewhat hard  Food Insecurity: Food Insecurity Present (09/19/2021)   Hunger Vital Sign    Worried About Running Out of Food in the Last Year: Sometimes true    Ran Out of Food in the Last Year: Never true  Transportation Needs: No Transportation Needs (09/19/2021)   PRAPARE - Hydrologist (Medical): No    Lack of Transportation (Non-Medical): No  Physical Activity: Sufficiently Active (09/19/2021)   Exercise Vital Sign    Days of Exercise per Week: 7 days    Minutes of Exercise per Session: 30 min   Stress: Stress Concern Present (09/19/2021)   [place]    Feeling of Stress : To some extent  Social Connections: Moderately Integrated (09/19/2021)   Social Connection and Isolation Panel [NHANES]    Frequency of Communication with Friends and Family: More than three times a week    Frequency of Social Gatherings with Friends and Family: More than three times a week    Attends Religious Services: More than 4 times per year    Active Member of [employer] or Organizations: Yes    Attends Music therapist: More than 4 times per year    Marital Status: Divorced     Family History: The patient's family history includes Arthritis in her mother; Asthma in her sister; Breast cancer in her maternal aunt and maternal grandmother; Colon cancer in her maternal grandmother; Diabetes in her sister; Diverticulitis in her sister; Heart disease in her father and mother; Hypertension in her father, mother, sister, and sister; Stroke in her mother.  ROS:   Please see the history of present illness.    All other systems reviewed and are negative.  EKGs/Labs/Other Studies Reviewed:    The following studies were reviewed today:  January 27, 2022 ZIO monitor personally reviewed HR 32 - 121 bpm, average 66 bpm. 6 nonsustained SVT, longest 17 beats at an average rate of 109 bpm. Wenckebach present. Occasional supraventricular ectopy, 2.9% Rare ventricular ectopy. No sustained arrhythmias. No atrial fibrillation. Symptom triggered events correspond to sinus rhythm +/- PVC.    Recent Labs: 06/13/2021: B Natriuretic Peptide 18.5 02/22/2022: TSH 2.020 03/03/2022: ALT 19 03/04/2022: BUN 16; Creatinine, Ser 0.76; Magnesium 1.9; Potassium 4.2; Sodium 139 03/07/2022: Hemoglobin 12.5; Platelets 191  Recent Lipid Panel    Component Value Date/Time   CHOL 146 02/22/2022 0903   CHOL 200 04/04/2013 1047   TRIG 174 (H) 02/22/2022 0903    TRIG 191 04/04/2013 1047   HDL 53 02/22/2022 0903   HDL 45 04/04/2013 1047   CHOLHDL  2.8 02/22/2022 0903   VLDL 38 04/04/2013 1047   LDLCALC 64 02/22/2022 0903   LDLCALC 117 (H) 04/04/2013 1047    Physical Exam:    VS:  BP 118/70   Pulse (!) 59   Ht '5\' 2"'$  (1.575 m)   Wt 188 lb 9.6 oz (85.5 kg) Comment: per pt report  SpO2 97%   BMI 34.50 kg/m     Wt Readings from Last 3 Encounters:  04/12/22 188 lb 9.6 oz (85.5 kg)  03/03/22 190 lb (86.2 kg)  02/22/22 188 lb (85.3 kg)     GEN:  Well nourished, well developed in no acute distress HEENT: Normal NECK: No JVD; No carotid bruits LYMPHATICS: No lymphadenopathy CARDIAC: RRR, no murmurs, rubs, gallops RESPIRATORY:  Clear to auscultation without rales, wheezing or rhonchi  ABDOMEN: Soft, non-tender, non-distended MUSCULOSKELETAL:  No edema; boot on left lower extremity. SKIN: Warm and dry NEUROLOGIC:  Alert and oriented x 3 PSYCHIATRIC:  Normal affect        ASSESSMENT:    1. Paroxysmal atrial fibrillation (HCC)   2. Chronic diastolic CHF (congestive heart failure) (HCC)    PLAN:    In order of problems listed above:   #Paroxysmal atrial fibrillation Low burden on recent ZIO monitor.  Currently taking Eliquis for stroke prophylaxis.  #Chronic diastolic heart failure NYHA class II.  Continue current medical therapy including Lasix and Jardiance.  Recommend follow-up in 1 year with an APP.   Total time spent with patient today 30 minutes. This includes reviewing records, evaluating the patient and coordinating care.   Medication Adjustments/Labs and Tests Ordered: Current medicines are reviewed at length with the patient today.  Concerns regarding medicines are outlined above.  No orders of the defined types were placed in this encounter.  No orders of the defined types were placed in this encounter.    Signed, Lars Mage, MD, [facility], [facility] 04/12/2022 10:32 AM    Electrophysiology [facility]

## 2022-04-12 ENCOUNTER — Ambulatory Visit: Payer: HMO | Attending: Cardiology | Admitting: Cardiology

## 2022-04-12 ENCOUNTER — Encounter: Payer: HMO | Admitting: Podiatry

## 2022-04-12 ENCOUNTER — Encounter: Payer: Self-pay | Admitting: Cardiology

## 2022-04-12 VITALS — BP 118/70 | HR 59 | Ht 62.0 in | Wt 188.6 lb

## 2022-04-12 DIAGNOSIS — I5032 Chronic diastolic (congestive) heart failure: Secondary | ICD-10-CM

## 2022-04-12 DIAGNOSIS — I480 Paroxysmal atrial fibrillation: Secondary | ICD-10-CM | POA: Diagnosis not present

## 2022-04-12 NOTE — Patient Instructions (Signed)
Medication Instructions:  none *If you need a refill on your cardiac medications before your next appointment, please call your pharmacy*   Lab Work: none If you have labs (blood work) drawn today and your tests are completely normal, you will receive your results only by: [place] (if you have MyChart) OR A paper copy in the mail If you have any lab test that is abnormal or we need to change your treatment, we will call you to review the results.   Testing/Procedures: none   Follow-Up: At [facility], you and your health needs are our priority.  As part of our continuing mission to provide you with exceptional heart care, we have created designated Provider Care Teams.  These Care Teams include your primary Cardiologist (physician) and Advanced Practice Providers (APPs -  Physician Assistants and Nurse Practitioners) who all work together to provide you with the care you need, when you need it.  We recommend signing up for the patient portal called "MyChart".  Sign up information is provided on this After Visit Summary.  MyChart is used to connect with patients for Virtual Visits (Telemedicine).  Patients are able to view lab/test results, encounter notes, upcoming appointments, etc.  Non-urgent messages can be sent to your provider as well.   To learn more about what you can do with MyChart, go to [email or web address].    Your next appointment:   1 year(s)  The format for your next appointment:   In Person  Provider:   You will see one of the following Advanced Practice Providers on your designated Care Team:   Murray Hodgkins, NP Christell Faith, PA-C Cadence Kathlen Mody, PA-C Gerrie Nordmann, NP      Other Instructions none  Important Information About [place]

## 2022-04-13 ENCOUNTER — Other Ambulatory Visit: Payer: Self-pay | Admitting: Family Medicine

## 2022-04-25 NOTE — Telephone Encounter (Signed)
Teresa [name] with home health advised

## 2022-04-25 NOTE — Telephone Encounter (Signed)
Teresa [name] for verbal orders    Eulis Foster, MD

## 2022-05-03 ENCOUNTER — Ambulatory Visit (INDEPENDENT_AMBULATORY_CARE_PROVIDER_SITE_OTHER): Payer: HMO

## 2022-05-03 ENCOUNTER — Ambulatory Visit (INDEPENDENT_AMBULATORY_CARE_PROVIDER_SITE_OTHER): Payer: HMO | Admitting: Podiatry

## 2022-05-03 DIAGNOSIS — Z9889 Other specified postprocedural states: Secondary | ICD-10-CM

## 2022-05-03 DIAGNOSIS — M19172 Post-traumatic osteoarthritis, left ankle and foot: Secondary | ICD-10-CM | POA: Diagnosis not present

## 2022-05-05 ENCOUNTER — Encounter: Payer: Self-pay | Admitting: Podiatry

## 2022-05-05 NOTE — Progress Notes (Signed)
  Subjective:  Patient ID: Teresa [name], female    DOB: 14-Jun-1946,  MRN: [id number]  Chief Complaint  Patient presents with   Routine Post Op    POV #3 DOS 03/03/2022 FUSION OF LT ANKLE JOINT, BONE GRAFT FROM HEEL & BONE MARROW FROM LEG, POSS HARDWARE REMOVAL     76 y.o. female returns for post-op check.  She continues to do well she has been nonweightbearing with the cast.  Review of Systems: Negative except as noted in the HPI. Denies N/V/F/Ch.   Objective:  There were no vitals filed for this visit. There is no height or weight on file to calculate BMI. Constitutional Well developed. Well nourished.  Vascular Foot warm and well perfused. Capillary refill normal to all digits.  Calf is soft and supple, no posterior calf or knee pain, negative Homans' sign  Neurologic Normal speech. Oriented to person, place, and time. Epicritic sensation to light touch grossly present bilaterally.  Dermatologic Incisions are now well-healed and not hypertrophic  Orthopedic: Tenderness to palpation noted about the surgical site.  Moderate edema   Multiple view plain film radiographs: New films taken today show maintained alignment and 3 screw arthrodesis of ankle.  Good early consolidation more so in the lateral and anterior posterior portions of the joint Assessment:   1. Post-traumatic arthritis of left ankle   2. Status post left foot surgery    Plan:  Patient was evaluated and treated and all questions answered.  S/p ankle surgery left -Radiographs reviewed.  Cast was removed today.  She may begin to [facility] in the cam walker boot gradually.  We discussed this transition process.  I will see her back in 1 month for new radiographs.   Return in about 1 month (around 06/02/2022) for post op (new x-rays).

## 2022-05-24 ENCOUNTER — Ambulatory Visit (INDEPENDENT_AMBULATORY_CARE_PROVIDER_SITE_OTHER): Payer: HMO

## 2022-05-24 DIAGNOSIS — Z23 Encounter for immunization: Secondary | ICD-10-CM | POA: Diagnosis not present

## 2022-05-31 ENCOUNTER — Ambulatory Visit (INDEPENDENT_AMBULATORY_CARE_PROVIDER_SITE_OTHER): Payer: HMO

## 2022-05-31 ENCOUNTER — Ambulatory Visit (INDEPENDENT_AMBULATORY_CARE_PROVIDER_SITE_OTHER): Payer: HMO | Admitting: Podiatry

## 2022-05-31 DIAGNOSIS — M19172 Post-traumatic osteoarthritis, left ankle and foot: Secondary | ICD-10-CM

## 2022-05-31 DIAGNOSIS — M25572 Pain in left ankle and joints of left foot: Secondary | ICD-10-CM | POA: Diagnosis not present

## 2022-05-31 DIAGNOSIS — Z9889 Other specified postprocedural states: Secondary | ICD-10-CM | POA: Diagnosis not present

## 2022-06-05 NOTE — Progress Notes (Signed)
  Subjective:  Patient ID: Teresa [name], female    DOB: 09-15-1945,  MRN: [id number]  Chief Complaint  Patient presents with   Routine Post Op    Follow up left ankle surgery -new xrays     76 y.o. female returns for post-op check.  She continues to do well she has been wearing the boot  Review of Systems: Negative except as noted in the HPI. Denies N/V/F/Ch.   Objective:  There were no vitals filed for this visit. There is no height or weight on file to calculate BMI. Constitutional Well developed. Well nourished.  Vascular Foot warm and well perfused. Capillary refill normal to all digits.  Calf is soft and supple, no posterior calf or knee pain, negative Homans' sign  Neurologic Normal speech. Oriented to person, place, and time. Epicritic sensation to light touch grossly present bilaterally.  Dermatologic Incisions are now well-healed and not hypertrophic  Orthopedic: Edema has improved.  She has minimal pain around the ankle   Multiple view plain film radiographs: New films taken today show maintained alignment and 3 screw arthrodesis of ankle.  Increased consolidation in central and posterior portions of joint Assessment:   1. Post-traumatic arthritis of left ankle   2. Sinus tarsi syndrome of left ankle   3. Status post left foot surgery    Plan:  Patient was evaluated and treated and all questions answered.  S/p ankle surgery left -Radiographs reviewed.  Cast was removed today.  She may begin to [facility] in gradually a shoe with a Tri-Lock ankle brace to support the ankle.  This was dispensed today.  I will see her back in 8 weeks for follow-up at that point likely will be able to transition to regular shoe gear and activity as tolerated.   Return in about 8 weeks (around 07/26/2022).

## 2022-06-08 ENCOUNTER — Ambulatory Visit: Payer: HMO | Admitting: Podiatry

## 2022-06-08 ENCOUNTER — Encounter: Payer: Self-pay | Admitting: Podiatry

## 2022-06-08 ENCOUNTER — Telehealth: Payer: Self-pay

## 2022-06-08 DIAGNOSIS — D689 Coagulation defect, unspecified: Secondary | ICD-10-CM | POA: Diagnosis not present

## 2022-06-08 DIAGNOSIS — E1140 Type 2 diabetes mellitus with diabetic neuropathy, unspecified: Secondary | ICD-10-CM

## 2022-06-08 DIAGNOSIS — B351 Tinea unguium: Secondary | ICD-10-CM | POA: Diagnosis not present

## 2022-06-08 DIAGNOSIS — M79676 Pain in unspecified toe(s): Secondary | ICD-10-CM

## 2022-06-08 NOTE — Telephone Encounter (Signed)
Copied from [place] [id number]. Topic: General - Other >> Jun 08, 2022 12:07 PM [place] B wrote: Reason for CRM: Patient called in states needs sined off on for her to get her diabetic shoes from [facility].

## 2022-06-08 NOTE — Telephone Encounter (Signed)
Do you have this forms

## 2022-06-08 NOTE — Progress Notes (Signed)
This patient returns to my office for at risk foot care.  This patient requires this care by a professional since this patient will be at risk due to having diabetes.This patient is unable to cut nails himself since the patient cannot reach his nails.These nails are painful walking and wearing shoes.  Patient had ankle surgery . This patient presents for at risk foot care today.  General Appearance  Alert, conversant and in no acute stress.  Vascular  Dorsalis pedis and posterior tibial  pulses are palpable  bilaterally.  Capillary return is within normal limits  bilaterally. Temperature is within normal limits  bilaterally.  Neurologic  Senn-Weinstein monofilament wire test within normal limits  bilaterally. Muscle power within normal limits bilaterally.  Nails Thick disfigured discolored nails with subungual debris  from hallux to fifth toes bilaterally. No evidence of bacterial infection or drainage bilaterally.  Orthopedic  No limitations of motion  feet .  No crepitus or effusions noted.  No bony pathology or digital deformities noted.  Skin  normotropic skin with no porokeratosis noted bilaterally.  No signs of infections or ulcers noted.     Onychomycosis  Pain in right toes  Pain in left toes  Consent was obtained for treatment procedures.   Mechanical debridement of nails 1-5  bilaterally performed with a nail nipper.  Filed with dremel without incident.    Return office visit   3 months                  Told patient to return for periodic foot care and evaluation due to potential at risk complications.   Gardiner Barefoot DPM

## 2022-06-08 NOTE — Telephone Encounter (Signed)
Form was completed and signed today 06/08/22 and placed in file to be faxed when able.   Eulis Foster, MD  [facility]  [phone]

## 2022-06-09 ENCOUNTER — Ambulatory Visit: Payer: HMO | Admitting: *Deleted

## 2022-06-09 DIAGNOSIS — E1140 Type 2 diabetes mellitus with diabetic neuropathy, unspecified: Secondary | ICD-10-CM

## 2022-06-09 DIAGNOSIS — M205X9 Other deformities of toe(s) (acquired), unspecified foot: Secondary | ICD-10-CM

## 2022-06-09 NOTE — Telephone Encounter (Signed)
Form faxed

## 2022-06-09 NOTE — Progress Notes (Unsigned)
Patient presents to the office today with issues concerning the diabetic shoe order   The shoes have been ordered .  We will send the foam box out to [facility] for inserts.  Patient will be notified for a fitting appointment once the shoes and inserts arrive in office.

## 2022-07-05 ENCOUNTER — Ambulatory Visit: Payer: PPO | Admitting: Family Medicine

## 2022-07-05 ENCOUNTER — Telehealth: Payer: Self-pay

## 2022-07-05 DIAGNOSIS — E119 Type 2 diabetes mellitus without complications: Secondary | ICD-10-CM

## 2022-07-05 NOTE — Progress Notes (Signed)
Chronic Care Management Pharmacy Assistant   Name: Teresa [name]  MRN: [id number] DOB: 02-Jul-1946  Reason for Encounter: Medication Review/Medication Coordination Call.   Recent office visits:  None ID  Recent consult visits:  06/08/2022 Gardiner Barefoot DPM (Podiatry) No medication Changes noted 05/31/2022 Lanae Crumbly DPM (Podiatry) No medication Changes noted 05/03/2022 Lanae Crumbly DPM (Podiatry) No medication Changes noted 04/12/2022 Dr. Quentin Ore MD (Cardiology) No medication Changes noted  Hospital visits:  None in previous 6 months  Medications: Outpatient Encounter Medications as of 07/05/2022  Medication Sig   acetaminophen (TYLENOL) 500 MG tablet Take 1,000 mg by mouth every 6 (six) hours as needed for mild pain or headache.   albuterol (VENTOLIN HFA) 108 (90 Base) MCG/ACT inhaler Inhale 2 puffs into the lungs every 6 (six) hours as needed for shortness of breath.   allopurinol (ZYLOPRIM) 100 MG tablet TAKE 1 TABLET BY MOUTH EVERY DAY   apixaban (ELIQUIS) 5 MG TABS tablet Take 5 mg by mouth 2 (two) times daily.   atorvastatin (LIPITOR) 10 MG tablet TAKE ONE TABLET BY MOUTH EVERYDAY AT BEDTIME   colchicine 0.6 MG tablet Take 0.6 mg by mouth daily as needed (gout).   furosemide (LASIX) 40 MG tablet Take 1 tablet (40 mg total) by mouth daily.   gabapentin (NEURONTIN) 800 MG tablet Take 800 mg by mouth 3 (three) times daily.   glimepiride (AMARYL) 4 MG tablet TAKE ONE TABLET BY MOUTH EVERY MORNING and TAKE ONE TABLET BY MOUTH EVERYDAY AT BEDTIME   JARDIANCE 25 MG TABS tablet TAKE 25 MG BY MOUTH DAILY.   levothyroxine (SYNTHROID) 88 MCG tablet TAKE 1 TABLET BY MOUTH EVERY DAY   metFORMIN (GLUCOPHAGE) 1000 MG tablet TAKE 1 TABLET BY MOUTH TWICE A DAY   nystatin cream (MYCOSTATIN) Apply 1 application topically 2 (two) times daily.   Omega-3 Fatty Acids (FISH OIL PO) Take 1 capsule by mouth daily.   ondansetron (ZOFRAN) 4 MG tablet TAKE 1 TABLET BY MOUTH EVERY 8 HOURS AS  NEEDED FOR NAUSEA AND VOMITING   oxyCODONE 10 MG TABS Take 1 tablet (10 mg total) by mouth every 4 (four) hours as needed for severe pain.   pioglitazone (ACTOS) 30 MG tablet TAKE ONE TABLET BY MOUTH EVERY MORNING   Semaglutide, 1 MG/DOSE, 4 MG/3ML SOPN Inject 1 mg as directed once a week.   Vitamin D, Ergocalciferol, (DRISDOL) 1.25 MG (50000 UNIT) CAPS capsule TAKE ONE CAPSULE BY MOUTH ONCE WEEKLY ON SUNDAY   No facility-administered encounter medications on file as of 07/05/2022.    Care Gaps: Shingrix Vaccine Diabetic Kidney evaluation COVID-19 Vaccine   Star Rating Drug: Ozempic 0.25-0.5 mg last filled on 04/07/2022 for a 84-day supply with [facility].  Jardiance 25 mg last filled on 04/13/2022 for a 90-Day supply with [facility] Metformin 1000 mg last filled on 04/13/2022 for a 90-Day supply with [facility] Atorvastatin 4 mg last filled on 04/13/2022  for a 90-Day supply with [facility] Pioglitazone 30 mg last filled on 04/13/2022 for a 90-Day supply with [facility]  Reviewed chart for medication changes ahead of medication coordination call.  BP Readings from Last 3 Encounters:  04/12/22 118/70  04/04/22 112/77  03/07/22 114/66    Lab Results  Component Value Date   HGBA1C 7.4 (H) 02/22/2022     Patient obtains medications through Adherence Packaging  90 Days   Last adherence delivery included: Eliquis 5 mg 1 tablet 2 times daily - Breakfast, Bedtime Furosemide 40 mg  1 tablet daily - Breakfast Levothyroxine 88 MCG 1 tablet daily - Breakfast Pioglitazone 30 mg 1 tablet daily - Breakfast Freestyle Libre 2 Sensor Change every 14 days Ozempic Inject 1 mg weekly (Thursday) Allopurinol 100 mg 1 tablet daily - Bedtime  Atorvastatin 10 mg 1 tablet  daily - Bedtime   Gabapentin 800 mg 1 tablet 3 times daily - Breakfast, Lunch, Bedtime  Jardiance 25 mg 1 tablet daily - Breakfast Metformin 1000 mg 1 tablet twice daily - Breakfast, Bedtime   Patient declined  medications last month due: Glimepiride  4 mg 1 tablet 2 times daily - Breakfast, bedtime (Medication was stop by PCP) Vitamin D 1.25 mg 1 capsule weekly on Sunday- Breakfast (Adequate supply)  Patient is due for next adherence delivery on: 07/18/2022 . Called patient and reviewed medications and coordinated delivery.  This delivery to include: Eliquis 5 mg 1 tablet 2 times daily - Breakfast, Bedtime Furosemide 40 mg 1 tablet daily - Breakfast Levothyroxine 88 MCG 1 tablet daily - Breakfast Pioglitazone 30 mg 1 tablet daily - Breakfast Freestyle Libre 2 Sensor Change every 14 days Allopurinol 100 mg 1 tablet daily - Bedtime  Atorvastatin 10 mg 1 tablet  daily - Bedtime   Gabapentin 800 mg 1 tablet 3 times daily - Breakfast, Lunch, Bedtime  Jardiance 25 mg 1 tablet daily - Breakfast Metformin 1000 mg 1 tablet twice daily - Breakfast, Bedtime   Patient declined the following medications: Vitamin D 1.25 mg 1 capsule weekly on Sunday- Breakfast (Adequate supply) Ozempic Inject 1 mg weekly (Thursday)-Per [facility],has a pending ozempic '1mg'$  order and it is currently still on back order and has been now for almost a month, we have not been able to get it in. Patient will need to get this from another pharmacy so she will need to check with local pharmacies near her to see if any have it in stock. Let us know if she would like for [place] to transfer the rx.Patient states [facility] to see if they have in any stock, and return my call.Patient states to send the prescription to [facility] in [place] [employer] [place].   Patient needs refills for Allopurinol ,Gabapentin,Jardiance,Metformin, Furosemide prescribe by PCP.   Marland Kitchen  Confirmed delivery date of 07/18/2022 (First route), advised patient that pharmacy will contact them the morning of delivery.  Patient states she is going to follow Dr. Rosanna Randy but she is on the waiting list to schedule appt. Patient is   aware that she will be unenroll with [place] since we are located at [facility], but she would like to stay enrolled until the end of the year unless she gets appointment first. Notified Clinical Pharmacist.   [place] Pharmacist Assistant [phone]

## 2022-07-06 MED ORDER — ALLOPURINOL 100 MG PO TABS: 100.0000 mg | ORAL_TABLET | Freq: Every day | ORAL | 0 refills | Status: AC

## 2022-07-06 MED ORDER — GABAPENTIN 800 MG PO TABS: 800.0000 mg | ORAL_TABLET | Freq: Three times a day (TID) | ORAL | 0 refills | Status: AC

## 2022-07-06 MED ORDER — EMPAGLIFLOZIN 25 MG PO TABS: 25.0000 mg | ORAL_TABLET | Freq: Every day | ORAL | 0 refills | Status: AC

## 2022-07-06 MED ORDER — FUROSEMIDE 40 MG PO TABS: 40.0000 mg | ORAL_TABLET | Freq: Every day | ORAL | 0 refills | Status: AC

## 2022-07-06 MED ORDER — METFORMIN HCL 1000 MG PO TABS
1000.0000 mg | ORAL_TABLET | Freq: Two times a day (BID) | ORAL | 0 refills | Status: AC
Start: 2022-07-06 — End: ?

## 2022-07-06 NOTE — Addendum Note (Signed)
Addended by: Daron Offer A on: 07/06/2022 08:33 AM   Modules accepted: Orders

## 2022-07-07 ENCOUNTER — Telehealth: Payer: Self-pay

## 2022-07-07 NOTE — Telephone Encounter (Signed)
Please call to schedule an appt with patient and her son and any other family members involved with her healthcare as deemed appropriate.   Eulis Foster, MD  [facility]

## 2022-07-07 NOTE — Telephone Encounter (Signed)
Spoke with son and patient. Scheduled patient for a f/u because it has been over 3 months since she has had her A1C checked. She said she would like to follow Dr. Rosanna Randy to [place], but family has concerns that they want to be addressed now. Son said he will bring her to appt to try to help her make a sound judgment call on if she should have control of her finances or not.

## 2022-07-07 NOTE — Telephone Encounter (Signed)
Copied from [place] [id number]. Topic: General - Other >> Jul 07, 2022  3:28 PM Ja-Kwan M wrote: Reason for CRM: Pt son Ron stated pt has been giving her account information out over the phone and they had to get the police involved because pt is being scammed. Ron asked if the pt could be deemed incompetent and a review of her medications be done. Ron asked that his call be returned to discuss. Cb# [phone]

## 2022-07-10 NOTE — Telephone Encounter (Signed)
Acknowledged. Thank you for scheduling patient. We will discuss on 07/12/22.    Eulis Foster, MD  [facility]

## 2022-07-11 ENCOUNTER — Telehealth: Payer: Self-pay | Admitting: Family Medicine

## 2022-07-11 NOTE — Progress Notes (Signed)
Established patient visit   Patient: Teresa [name]   DOB: 02-08-1946   76 y.o. Female  MRN: [id number] Visit Date: 07/12/2022  Today's healthcare provider: Caro Laroche, DO   No chief complaint on file.  Subjective    HPI   Diabetes, Type 2 - Last A1c 7.4 02/2022 - Medications: ozempic, metformin, jardiance, actos - Compliance: good - Checking BG at home: 120s-150s, sometimes dips into 50s nightly. Has been nauseous, diarrhea the past week.  - Eye exam: UTD - Foot exam: UTD - Microalbumin: due - Statin: yes - Denies symptoms of polyuria, polydipsia, numbness extremities, foot ulcers/trauma.  Memory concerns - h/o stroke last year - some memory concerns from son.  - patient reports she does have some trouble remembering why she entered room - has pill pack from pharmacy, no trouble taking medication - son oversees finances as she was scammed last year.  - grandson lives with her - previously evaluated by neuro after stroke, h/o dizziness and seizure-like activity, had normal 3 hr EEG with plans for future memory evaluation but lost to follow up   Medications: Outpatient Medications Prior to Visit  Medication Sig   acetaminophen (TYLENOL) 500 MG tablet Take 1,000 mg by mouth every 6 (six) hours as needed for mild pain or headache.   albuterol (VENTOLIN HFA) 108 (90 Base) MCG/ACT inhaler Inhale 2 puffs into the lungs every 6 (six) hours as needed for shortness of breath.   allopurinol (ZYLOPRIM) 100 MG tablet Take 1 tablet (100 mg total) by mouth daily.   apixaban (ELIQUIS) 5 MG TABS tablet Take 5 mg by mouth 2 (two) times daily.   atorvastatin (LIPITOR) 10 MG tablet TAKE ONE TABLET BY MOUTH EVERYDAY AT BEDTIME   colchicine 0.6 MG tablet Take 0.6 mg by mouth daily as needed (gout).   empagliflozin (JARDIANCE) 25 MG TABS tablet Take 1 tablet (25 mg total) by mouth daily.   furosemide (LASIX) 40 MG tablet Take 1 tablet (40 mg total) by mouth daily.   gabapentin  (NEURONTIN) 800 MG tablet Take 1 tablet (800 mg total) by mouth 3 (three) times daily.   levothyroxine (SYNTHROID) 88 MCG tablet TAKE 1 TABLET BY MOUTH EVERY DAY   metFORMIN (GLUCOPHAGE) 1000 MG tablet Take 1 tablet (1,000 mg total) by mouth 2 (two) times daily.   nystatin cream (MYCOSTATIN) Apply 1 application topically 2 (two) times daily.   Omega-3 Fatty Acids (FISH OIL PO) Take 1 capsule by mouth daily.   ondansetron (ZOFRAN) 4 MG tablet TAKE 1 TABLET BY MOUTH EVERY 8 HOURS AS NEEDED FOR NAUSEA AND VOMITING   Semaglutide, 1 MG/DOSE, 4 MG/3ML SOPN Inject 1 mg as directed once a week.   Vitamin D, Ergocalciferol, (DRISDOL) 1.25 MG (50000 UNIT) CAPS capsule TAKE ONE CAPSULE BY MOUTH ONCE WEEKLY ON SUNDAY   [DISCONTINUED] glimepiride (AMARYL) 4 MG tablet TAKE ONE TABLET BY MOUTH EVERY MORNING and TAKE ONE TABLET BY MOUTH EVERYDAY AT BEDTIME   [DISCONTINUED] oxyCODONE 10 MG TABS Take 1 tablet (10 mg total) by mouth every 4 (four) hours as needed for severe pain.   [DISCONTINUED] pioglitazone (ACTOS) 30 MG tablet TAKE ONE TABLET BY MOUTH EVERY MORNING   No facility-administered medications prior to visit.    Review of Systems     Objective    BP 114/76   Pulse 71   Wt 184 lb (83.5 kg)   SpO2 98%   BMI 33.65 kg/m    Physical Exam  Gen: well appearing, in NAD Card: RRR Lungs: CTAB Ext: WWP, no edema Neuro: Alert and oriented, speech normal. Mini-cog 2/3 words remembered at 5 minutes, 0 points for clock test.  Results for orders placed or performed in visit on 07/12/22  POCT HgB A1C  Result Value Ref Range   Hemoglobin A1C 6.7 (A) 4.0 - 5.6 %   HbA1c POC (<> result, manual entry)     HbA1c, POC (prediabetic range)     HbA1c, POC (controlled diabetic range)      Assessment & Plan     Problem List Items Addressed This Visit       Endocrine   Type 2 diabetes mellitus with diabetic neuropathy, without long-term current use of insulin (HCC) - Primary    Remains tightly  controlled, A1c 6.7 today, below goal of <8. With some hypoglycemic episodes, will decrease actos to 15mg  daily. F/u 3 months.       Relevant Medications   pioglitazone (ACTOS) 15 MG tablet   Other Relevant Orders   Urine Microalbumin w/creat. ratio   POCT HgB A1C (Completed)     Other   Memory deficit    Ongoing concerns, getting worse per son. Able to perform ADLs, most IADLs. Mini cog today remembered 2/3 words at 5 minutes and failed clock test. Agree with neuro evaluation for more extensive memory testing, recommend f/u soon. Aricept started today. Obtaining labs. Consider PHQ/GAD on f/u.      Other Visit Diagnoses     Type 2 diabetes mellitus with complication, without long-term current use of insulin (HCC)       Relevant Medications   pioglitazone (ACTOS) 15 MG tablet   Memory loss due to medical condition       Relevant Orders   Comprehensive metabolic panel   CBC        Return in about 3 months (around 10/11/2022) for dm f/u.      Winfield Cunas, DO, have reviewed all documentation for this visit.  Portions of this information were initially documented by the CMA and reviewed by me for thoroughness and accuracy.      Caro Laroche, DO   Family Practice [phone] (phone) [phone] (fax)  [facility]

## 2022-07-11 NOTE — Telephone Encounter (Signed)
Pt called to report that she needs a refill on her sensors among other things that were requested from [facility]. Pt has an appt tomorrow.   [facility] - [place], [place] - [address]  [address] [place] [address]  Phone: [phone] Fax: [phone]

## 2022-07-11 NOTE — Telephone Encounter (Signed)
Will discuss at upcoming appointment   Eulis Foster, MD [place] PGY-3

## 2022-07-12 ENCOUNTER — Encounter: Payer: Self-pay | Admitting: Family Medicine

## 2022-07-12 ENCOUNTER — Other Ambulatory Visit: Payer: Self-pay | Admitting: Family Medicine

## 2022-07-12 ENCOUNTER — Ambulatory Visit (INDEPENDENT_AMBULATORY_CARE_PROVIDER_SITE_OTHER): Payer: HMO | Admitting: Family Medicine

## 2022-07-12 VITALS — BP 114/76 | HR 71 | Wt 184.0 lb

## 2022-07-12 DIAGNOSIS — E1140 Type 2 diabetes mellitus with diabetic neuropathy, unspecified: Secondary | ICD-10-CM

## 2022-07-12 DIAGNOSIS — E118 Type 2 diabetes mellitus with unspecified complications: Secondary | ICD-10-CM

## 2022-07-12 DIAGNOSIS — R413 Other amnesia: Secondary | ICD-10-CM | POA: Diagnosis not present

## 2022-07-12 LAB — POCT GLYCOSYLATED HEMOGLOBIN (HGB A1C): Hemoglobin A1C: 6.7 % — AB (ref 4.0–5.6)

## 2022-07-12 MED ORDER — DONEPEZIL HCL 5 MG PO TABS: 5.0000 mg | ORAL_TABLET | Freq: Every day | ORAL | 3 refills | Status: DC

## 2022-07-12 MED ORDER — FREESTYLE LIBRE 2 SENSOR MISC: 1.0000 | 3 refills | Status: DC

## 2022-07-12 MED ORDER — PIOGLITAZONE HCL 15 MG PO TABS: 15.0000 mg | ORAL_TABLET | Freq: Every morning | ORAL | 0 refills | Status: AC

## 2022-07-12 NOTE — Assessment & Plan Note (Addendum)
Ongoing concerns, getting worse per son. Able to perform ADLs, most IADLs. Mini cog today remembered 2/3 words at 5 minutes and failed clock test. Agree with neuro evaluation for more extensive memory testing, recommend f/u soon. Aricept started today. Obtaining labs. Consider PHQ/GAD on f/u.

## 2022-07-12 NOTE — Assessment & Plan Note (Signed)
Remains tightly controlled, A1c 6.7 today, below goal of <8. With some hypoglycemic episodes, will decrease actos to '15mg'$  daily. F/u 3 months.

## 2022-07-13 LAB — CBC
Hematocrit: 47.6 % — ABNORMAL HIGH (ref 34.0–46.6)
Hemoglobin: 15.6 g/dL (ref 11.1–15.9)
MCH: 29 pg (ref 26.6–33.0)
MCHC: 32.8 g/dL (ref 31.5–35.7)
MCV: 89 fL (ref 79–97)
Platelets: 218 10*3/uL (ref 150–450)
RBC: 5.38 x10E6/uL — ABNORMAL HIGH (ref 3.77–5.28)
RDW: 15.1 % (ref 11.7–15.4)
WBC: 11.2 10*3/uL — ABNORMAL HIGH (ref 3.4–10.8)

## 2022-07-13 LAB — COMPREHENSIVE METABOLIC PANEL
ALT: 17 IU/L (ref 0–32)
AST: 21 IU/L (ref 0–40)
Albumin/Globulin Ratio: 1.8 (ref 1.2–2.2)
Albumin: 4.9 g/dL — ABNORMAL HIGH (ref 3.8–4.8)
Alkaline Phosphatase: 129 IU/L — ABNORMAL HIGH (ref 44–121)
BUN/Creatinine Ratio: 18 (ref 12–28)
BUN: 14 mg/dL (ref 8–27)
Bilirubin Total: 0.5 mg/dL (ref 0.0–1.2)
CO2: 25 mmol/L (ref 20–29)
Calcium: 9.3 mg/dL (ref 8.7–10.3)
Chloride: 97 mmol/L (ref 96–106)
Creatinine, Ser: 0.78 mg/dL (ref 0.57–1.00)
Globulin, Total: 2.7 g/dL (ref 1.5–4.5)
Glucose: 114 mg/dL — ABNORMAL HIGH (ref 70–99)
Potassium: 3.7 mmol/L (ref 3.5–5.2)
Sodium: 141 mmol/L (ref 134–144)
Total Protein: 7.6 g/dL (ref 6.0–8.5)
eGFR: 79 mL/min/{1.73_m2} (ref >59)

## 2022-07-13 LAB — MICROALBUMIN / CREATININE URINE RATIO
Creatinine, Urine: 43.9 mg/dL
Microalb/Creat Ratio: 14 mg/g creat (ref 0–29)
Microalbumin, Urine: 6 ug/mL

## 2022-07-13 LAB — SPECIMEN STATUS REPORT

## 2022-07-14 NOTE — Telephone Encounter (Signed)
Was refilled at appt with Dr. Ky Barban

## 2022-07-20 ENCOUNTER — Telehealth: Payer: Self-pay | Admitting: Podiatry

## 2022-07-20 NOTE — Telephone Encounter (Signed)
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 She states she has phenergan but doesn't have any needles to give herself a shot. She wants to know if Dr Sherryle Lis will give her some needles.  Please advise

## 2022-07-21 ENCOUNTER — Ambulatory Visit (INDEPENDENT_AMBULATORY_CARE_PROVIDER_SITE_OTHER): Payer: HMO | Admitting: Physician Assistant

## 2022-07-21 ENCOUNTER — Encounter: Payer: Self-pay | Admitting: Physician Assistant

## 2022-07-21 VITALS — BP 154/74 | HR 66 | Wt 184.0 lb

## 2022-07-21 DIAGNOSIS — R21 Rash and other nonspecific skin eruption: Secondary | ICD-10-CM | POA: Diagnosis not present

## 2022-07-21 DIAGNOSIS — F419 Anxiety disorder, unspecified: Secondary | ICD-10-CM

## 2022-07-21 DIAGNOSIS — R110 Nausea: Secondary | ICD-10-CM

## 2022-07-21 MED ORDER — PROMETHAZINE HCL 12.5 MG PO TABS: 12.5000 mg | ORAL_TABLET | Freq: Four times a day (QID) | ORAL | 0 refills | Status: AC | PRN

## 2022-07-21 MED ORDER — SERTRALINE HCL 25 MG PO TABS: 25.0000 mg | ORAL_TABLET | Freq: Every day | ORAL | 1 refills | Status: DC

## 2022-07-21 MED ORDER — NYSTATIN 100000 UNIT/GM EX POWD: 1.0000 | Freq: Three times a day (TID) | CUTANEOUS | 0 refills | Status: DC

## 2022-07-21 NOTE — Progress Notes (Signed)
I,Sha'taria Tyson,acting as a Education administrator for [employer], PA-C.,have documented all relevant documentation on the behalf of Teresa Kirschner, PA-C,as directed by  Teresa Kirschner, PA-C while in the presence of Teresa Kirschner, PA-C.   Established patient visit   Patient: Teresa [name]   DOB: [birth year]   76 y.o. Female  MRN: [id number] Visit Date: 07/21/2022  Today's healthcare provider: [name], PA-C   Cc. Nausea  Subjective    HPI   Pt reports she is nauseous after meals, and in between meals x 3 weeks. Denies vomiting. Reports occasionally diarrhea, denies blood in BM.  She reports being nauseous due to her nerves, reports her living and family situation are stressful. Reports recently being scammed and her living situation and financials have changed since then. Reports having a new online contact who she is excited to meet but hesitant after the scamming.  Reports zofran is not working for nausea, would like to try phenergan. 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  Reports cramping intermittent abdominal pain, denies fevers, chills.   Reports a yeast rash under her breast and along her groin, uses topical nystatin but prefers the powder. Denies bleeding.     07/21/2022    2:45 PM  GAD 7 : Generalized Anxiety Score  Nervous, Anxious, on Edge 2  Control/stop worrying 3  Worry too much - different things 2  Trouble relaxing 2  Restless 0  Easily annoyed or irritable 0  Afraid - awful might happen 0  Total GAD 7 Score 9  Anxiety Difficulty Not difficult at all        07/21/2022    2:44 PM 09/19/2021    9:34 AM 05/16/2021    8:32 AM  PHQ9 SCORE ONLY  PHQ-9 Total Score '9 2 4    '$ Medications: Outpatient Medications Prior to Visit  Medication Sig   acetaminophen (TYLENOL) 500 MG tablet Take 1,000 mg by mouth every 6 (six) hours as needed for mild pain or headache.    albuterol (VENTOLIN HFA) 108 (90 Base) MCG/ACT inhaler Inhale 2 puffs into the lungs every 6 (six) hours as needed for shortness of breath.   allopurinol (ZYLOPRIM) 100 MG tablet Take 1 tablet (100 mg total) by mouth daily.   apixaban (ELIQUIS) 5 MG TABS tablet Take 5 mg by mouth 2 (two) times daily.   atorvastatin (LIPITOR) 10 MG tablet TAKE ONE TABLET BY MOUTH EVERYDAY AT BEDTIME   colchicine 0.6 MG tablet Take 0.6 mg by mouth daily as needed (gout).   Continuous Blood Gluc Sensor (FREESTYLE LIBRE 2 SENSOR) MISC 1 each by Does not apply route every 14 (fourteen) days.   empagliflozin (JARDIANCE) 25 MG TABS tablet Take 1 tablet (25 mg total) by mouth daily.   furosemide (LASIX) 40 MG tablet Take 1 tablet (40 mg total) by mouth daily.   gabapentin (NEURONTIN) 800 MG tablet Take 1 tablet (800 mg total) by mouth 3 (three) times daily.   levothyroxine (SYNTHROID) 88 MCG tablet TAKE 1 TABLET BY MOUTH EVERY DAY   metFORMIN (GLUCOPHAGE) 1000 MG tablet Take 1 tablet (1,000 mg total) by mouth 2 (two) times daily.   Omega-3 Fatty Acids (FISH OIL PO) Take 1 capsule by mouth daily.   ondansetron (ZOFRAN) 4 MG tablet TAKE 1 TABLET BY MOUTH EVERY 8 HOURS AS NEEDED FOR NAUSEA AND VOMITING   pioglitazone (ACTOS) 15 MG tablet Take  1 tablet (15 mg total) by mouth every morning.   Semaglutide, 1 MG/DOSE, 4 MG/3ML SOPN Inject 1 mg as directed once a week.   [DISCONTINUED] nystatin cream (MYCOSTATIN) Apply 1 application topically 2 (two) times daily.   donepezil (ARICEPT) 5 MG tablet Take 1 tablet (5 mg total) by mouth at bedtime. (Patient not taking: Reported on 07/21/2022)   [DISCONTINUED] Vitamin D, Ergocalciferol, (DRISDOL) 1.25 MG (50000 UNIT) CAPS capsule TAKE ONE CAPSULE BY MOUTH ONCE WEEKLY ON SUNDAY   No facility-administered medications prior to visit.    Review of Systems  Constitutional:  Negative for fatigue and fever.  Respiratory:  Negative for cough and shortness of breath.   Cardiovascular:   Negative for chest pain and leg swelling.  Gastrointestinal:  Positive for nausea. Negative for abdominal pain.  Neurological:  Negative for dizziness and headaches.      Objective    Blood pressure (!) 154/74, pulse 66, weight 184 lb (83.5 kg), SpO2 94 %.   Physical Exam Vitals reviewed.  Constitutional:      Appearance: She is not ill-appearing.  HENT:     Head: Normocephalic.  Eyes:     Conjunctiva/sclera: Conjunctivae normal.  Cardiovascular:     Rate and Rhythm: Normal rate.  Pulmonary:     Effort: Pulmonary effort is normal. No respiratory distress.  Skin:    Comments: Erythematous rash w/ satellite papules underneath L breast. Pt declines groin exam  Neurological:     General: No focal deficit present.     Mental Status: She is alert and oriented to person, place, and time.  Psychiatric:        Mood and Affect: Mood normal.        Behavior: Behavior normal.     No results found for any visits on 07/21/22.  Assessment & Plan    Rash Rx nystatin powder. Keep area clean and dry.  2. Nausea Can d/c zofran and try phenergan. Advised we will try oral phenergan, not injectable.   3. Anxiety Discussed ongoing physical symptoms of anxiety and current life situation at length.  I think she would benefit from at least trying an SSRI, but ultimately an antipsychotic may be more beneficial. Would likely benefit from a psych referral.  Reviewed last visit w/ minicog-- some abnormalities. Pt has not started aricept. At next visit would do a full mmse. My impression today is less dementia and more mental health issue Rx zoloft 25 mg daily. F/u 6 weeks.   Strongly advised against meeting strangers from the internet and that many do not have her best interests at heart. Never give personal information to strangers, especially financial information. If meeting a new person, meet in public or with friends/family.   Return in about 6 weeks (around 09/01/2022) for anxiety.      I,  Teresa Kirschner, PA-C have reviewed all documentation for this visit. The documentation on  07/21/2022  for the exam, diagnosis, procedures, and orders are all accurate and complete.  Teresa Kirschner, PA-C [facility] [address] [place], [place], [address] Office: [phone] Fax: [place]

## 2022-07-24 ENCOUNTER — Telehealth: Payer: Self-pay

## 2022-07-24 ENCOUNTER — Telehealth: Payer: Self-pay | Admitting: Family Medicine

## 2022-07-24 ENCOUNTER — Other Ambulatory Visit: Payer: Self-pay | Admitting: Physician Assistant

## 2022-07-24 DIAGNOSIS — R21 Rash and other nonspecific skin eruption: Secondary | ICD-10-CM

## 2022-07-24 MED ORDER — NYSTATIN 100000 UNIT/GM EX POWD: 1.0000 | Freq: Three times a day (TID) | CUTANEOUS | 0 refills | Status: AC

## 2022-07-24 NOTE — Telephone Encounter (Signed)
Ariel from [employer] called in states doesn't have the '15mg'$  of nyststin, only has the 60 mg. Please call back to discuss. Fx is  [phone]

## 2022-07-24 NOTE — Telephone Encounter (Signed)
Pt's son called, Ron regarding pt.   Ron states that pt continues with vomiting and diarrhea.   Pt is not taking Aricept. Son thinks she should be. Pt was overheard giving her banking information to a stranger on the phone.  Son states that pt is not manageable by him. He is very concerned that she is not being truthful at office visits.  Son would like help with pt.  Please advise.

## 2022-07-24 NOTE — Telephone Encounter (Signed)
Resent rx

## 2022-07-25 NOTE — Telephone Encounter (Signed)
Patient son advised. States he will get in contact with patients grandson so that they can all come in because he is the one that is seeing her vomit after eating any type of meal and then call back to schedule an appointment

## 2022-07-26 ENCOUNTER — Ambulatory Visit (INDEPENDENT_AMBULATORY_CARE_PROVIDER_SITE_OTHER): Payer: HMO | Admitting: Podiatry

## 2022-07-26 ENCOUNTER — Ambulatory Visit (INDEPENDENT_AMBULATORY_CARE_PROVIDER_SITE_OTHER): Payer: HMO

## 2022-07-26 DIAGNOSIS — M960 Pseudarthrosis after fusion or arthrodesis: Secondary | ICD-10-CM

## 2022-07-26 DIAGNOSIS — M19172 Post-traumatic osteoarthritis, left ankle and foot: Secondary | ICD-10-CM | POA: Diagnosis not present

## 2022-07-26 NOTE — Progress Notes (Signed)
Referral was sent today. 

## 2022-07-26 NOTE — Progress Notes (Signed)
  Subjective:  Patient ID: Teresa [name], female    DOB: [birth year],  MRN: [id number]  Chief Complaint  Patient presents with   Arthritis    Follow up Left foot     76 y.o. female returns for post-op check.  She still having some pain and swelling, she is in regular shoe gear wears the ankle brace at home  Review of Systems: Negative except as noted in the HPI. Denies N/V/F/Ch.   Objective:  There were no vitals filed for this visit. There is no height or weight on file to calculate BMI. Constitutional Well developed. Well nourished.  Vascular Foot warm and well perfused. Capillary refill normal to all digits.  Calf is soft and supple, no posterior calf or knee pain, negative Homans' sign  Neurologic Normal speech. Oriented to person, place, and time. Epicritic sensation to light touch grossly present bilaterally.  Dermatologic Incisions are now well-healed and not hypertrophic  Orthopedic: Edema has improved.  She has some persistent pain around the ankle   Multiple view plain film radiographs: New films taken today show maintained alignment and 3 screw arthrodesis of ankle.  Increased consolidation in central and posterior portions of joint, there is persistent lucency at the anterior and medial portions Assessment:   1. 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  S/p ankle surgery left -We reviewed her x-rays.  She does have some persistent lucency and delayed bone healing.  I recommend noninvasive bone stimulation and an order for this was placed and referral sent.  She may continue WBAT in supportive shoe gear.  Her diabetic shoes were ready and were dispensed today and were assessed for form fit and function these should offer some increased ability.  Break-in period reviewed.  I will see her back in 3 months for new x-rays   Return in about 3 months (around 10/25/2022)  for L ankle fusion follow up (new xrays).

## 2022-07-27 ENCOUNTER — Other Ambulatory Visit: Payer: Self-pay | Admitting: Physician Assistant

## 2022-07-27 DIAGNOSIS — E1140 Type 2 diabetes mellitus with diabetic neuropathy, unspecified: Secondary | ICD-10-CM

## 2022-07-27 MED ORDER — SEMAGLUTIDE (1 MG/DOSE) 4 MG/3ML ~~LOC~~ SOPN: 1.0000 mg | PEN_INJECTOR | SUBCUTANEOUS | 0 refills | Status: DC

## 2022-08-07 DIAGNOSIS — M48061 Spinal stenosis, lumbar region without neurogenic claudication: Secondary | ICD-10-CM

## 2022-08-07 HISTORY — DX: Spinal stenosis, lumbar region without neurogenic claudication: M48.061

## 2022-08-14 DIAGNOSIS — M960 Pseudarthrosis after fusion or arthrodesis: Secondary | ICD-10-CM | POA: Diagnosis not present

## 2022-08-15 DIAGNOSIS — J4540 Moderate persistent asthma, uncomplicated: Secondary | ICD-10-CM | POA: Diagnosis not present

## 2022-08-16 NOTE — Progress Notes (Unsigned)
I,Sha'taria Tyson,acting as a Education administrator for [employer], PA-C.,have documented all relevant documentation on the behalf of [name], PA-C,as directed by  [name], PA-C while in the presence of [name], PA-C.   Established patient visit   Patient: Teresa [name]   DOB: 08/05/1946   77 y.o. Female  MRN: [id number] Visit Date: 08/17/2022  Today's healthcare provider: [name], PA-C   Cc. Anxiety f/u  Subjective    HPI  Anxiety, Follow-up  She was last seen for anxiety 4 weeks ago. Changes made at last visit include Rx zoloft 25 mg daily.   She reports excellent compliance with treatment. She reports fair tolerance of treatment. She is not having side effects.   She feels her anxiety is mild and Unchanged since last visit.  Symptoms: No chest pain No difficulty concentrating  No dizziness No fatigue  No feelings of losing control No insomnia  No irritable No palpitations  No panic attacks No racing thoughts  Yes shortness of breath No sweating  No tremors/shakes    GAD-7 Results    08/17/2022    8:24 AM 07/21/2022    2:45 PM  GAD-7 Generalized Anxiety Disorder Screening Tool  1. Feeling Nervous, Anxious, or on Edge 0 2  2. Not Being Able to Stop or Control Worrying 0 3  3. Worrying Too Much About Different Things 0 2  4. Trouble Relaxing 0 2  5. Being So Restless it's Hard To Sit Still 0 0  6. Becoming Easily Annoyed or Irritable 0 0  7. Feeling Afraid As If Something Awful Might Happen 0 0  Total GAD-7 Score 0 9  Difficulty At Work, Home, or Getting  Along With Others? Not difficult at all Not difficult at all    PHQ-9 Scores    08/17/2022    8:24 AM 07/21/2022    2:44 PM 09/19/2021    9:34 AM  PHQ9 SCORE ONLY  PHQ-9 Total Score '2 9 2   '$ ---------------------------------------------------------------------------------------------------  Pt reports her nausea is better with phenergen. Does not want to discuss nausea triggers  further, denies having an issue with food. Denies vomiting but admits to dry heaving. States if anything, it is related to her anxiety.  She again professes difficulties at home, concerns over being put in assisted living, being told she has dementia. Pt has a virtual friend who family is concerned is scamming her, but she is certain they are a real person with whom she is enamored with.  Medications: Outpatient Medications Prior to Visit  Medication Sig   acetaminophen (TYLENOL) 500 MG tablet Take 1,000 mg by mouth every 6 (six) hours as needed for mild pain or headache.   albuterol (VENTOLIN HFA) 108 (90 Base) MCG/ACT inhaler Inhale 2 puffs into the lungs every 6 (six) hours as needed for shortness of breath.   allopurinol (ZYLOPRIM) 100 MG tablet Take 1 tablet (100 mg total) by mouth daily.   apixaban (ELIQUIS) 5 MG TABS tablet Take 5 mg by mouth 2 (two) times daily.   atorvastatin (LIPITOR) 10 MG tablet TAKE ONE TABLET BY MOUTH EVERYDAY AT BEDTIME   colchicine 0.6 MG tablet Take 0.6 mg by mouth daily as needed (gout).   Continuous Blood Gluc Sensor (FREESTYLE LIBRE 2 SENSOR) MISC 1 each by Does not apply route every 14 (fourteen) days.   donepezil (ARICEPT) 5 MG tablet Take 1 tablet (5 mg total) by mouth at bedtime.   empagliflozin (JARDIANCE) 25 MG TABS tablet Take  1 tablet (25 mg total) by mouth daily.   furosemide (LASIX) 40 MG tablet Take 1 tablet (40 mg total) by mouth daily.   gabapentin (NEURONTIN) 800 MG tablet Take 1 tablet (800 mg total) by mouth 3 (three) times daily.   levothyroxine (SYNTHROID) 88 MCG tablet TAKE 1 TABLET BY MOUTH EVERY DAY   metFORMIN (GLUCOPHAGE) 1000 MG tablet Take 1 tablet (1,000 mg total) by mouth 2 (two) times daily.   nystatin (MYCOSTATIN/NYSTOP) powder Apply 1 Application topically 3 (three) times daily.   Omega-3 Fatty Acids (FISH OIL PO) Take 1 capsule by mouth daily.   ondansetron (ZOFRAN) 4 MG tablet TAKE 1 TABLET BY MOUTH EVERY 8 HOURS AS NEEDED FOR  NAUSEA AND VOMITING   pioglitazone (ACTOS) 15 MG tablet Take 1 tablet (15 mg total) by mouth every morning.   promethazine (PHENERGAN) 12.5 MG tablet Take 1 tablet (12.5 mg total) by mouth every 6 (six) hours as needed for nausea or vomiting.   Semaglutide, 1 MG/DOSE, 4 MG/3ML SOPN Inject 1 mg as directed once a week.   [DISCONTINUED] sertraline (ZOLOFT) 25 MG tablet Take 1 tablet (25 mg total) by mouth daily.   No facility-administered medications prior to visit.      Objective    Blood pressure 121/64, pulse 69, weight 183 lb (83 kg), SpO2 100 %.   Physical Exam Vitals reviewed.  Constitutional:      Appearance: She is not ill-appearing.  HENT:     Head: Normocephalic.  Eyes:     Conjunctiva/sclera: Conjunctivae normal.  Cardiovascular:     Rate and Rhythm: Normal rate.  Pulmonary:     Effort: Pulmonary effort is normal. No respiratory distress.  Neurological:     General: No focal deficit present.     Mental Status: She is alert and oriented to person, place, and time.  Psychiatric:        Mood and Affect: Mood normal.        Behavior: Behavior normal.      No results found for any visits on 08/17/22.  Assessment & Plan     Problem List Items Addressed This Visit       Other   Anxiety - Primary    Started pt on sertraline 25 mg, as there is no benefit yet, advised increasing to 50 mg. Pt is tolerating 25 mg dose well. Advised taking 2 of current rx and then picking up the higher dose when she is finished. Discussed bringing on social work to help with struggles with safety at home, protection from scams. Pt is agreeable to a discussion.      Relevant Medications   sertraline (ZOLOFT) 50 MG tablet   Other Relevant Orders   AMB Referral to [facility] ([facility])     Return in about 4 weeks (around 09/14/2022) for anxiety.      I, [name], PA-C have reviewed all documentation for this visit. The documentation on  08/17/22  for the exam,  diagnosis, procedures, and orders are all accurate and complete.  [name], PA-C [facility] [address] [place], [place], [address] Office: [phone] Fax: [place]

## 2022-08-17 ENCOUNTER — Encounter: Payer: Self-pay | Admitting: Physician Assistant

## 2022-08-17 ENCOUNTER — Telehealth: Payer: Self-pay | Admitting: *Deleted

## 2022-08-17 ENCOUNTER — Ambulatory Visit (INDEPENDENT_AMBULATORY_CARE_PROVIDER_SITE_OTHER): Payer: HMO | Admitting: Physician Assistant

## 2022-08-17 VITALS — BP 121/64 | HR 69 | Wt 183.0 lb

## 2022-08-17 DIAGNOSIS — F419 Anxiety disorder, unspecified: Secondary | ICD-10-CM | POA: Diagnosis not present

## 2022-08-17 MED ORDER — SERTRALINE HCL 50 MG PO TABS: 50.0000 mg | ORAL_TABLET | Freq: Every day | ORAL | 1 refills | Status: DC

## 2022-08-17 NOTE — Assessment & Plan Note (Addendum)
Started pt on sertraline 25 mg, as there is no benefit yet, advised increasing to 50 mg. Pt is tolerating 25 mg dose well. Advised taking 2 of current rx and then picking up the higher dose when she is finished. Discussed bringing on social work to help with struggles with safety at home, protection from scams. Pt is agreeable to a discussion.

## 2022-08-17 NOTE — Progress Notes (Signed)
  Care Coordination   Note   08/17/2022 Name: Teresa [name] MRN: [id number] DOB: 05/03/1946  Teresa [name] is a 77 y.o. year old female who sees Simmons-Robinson, Riki Sheer, MD for primary care. I reached out to Teresa [name] by phone today to offer care coordination services.  Teresa [name] was given information about Care Coordination services today including:   The Care Coordination services include support from the care team which includes your Nurse Coordinator, Clinical Social Worker, or Pharmacist.  The Care Coordination team is here to help remove barriers to the health concerns and goals most important to you. Care Coordination services are voluntary, and the patient may decline or stop services at any time by request to their care team member.   Care Coordination Consent Status: Patient agreed to services and verbal consent obtained.   Follow up plan:  Telephone appointment with care coordination team member scheduled for:  08/22/2022  Encounter Outcome:  Pt. Scheduled from referral   Julian Hy, [place] Direct Dial: [phone]

## 2022-08-22 ENCOUNTER — Ambulatory Visit: Payer: Self-pay | Admitting: *Deleted

## 2022-08-22 NOTE — Patient Instructions (Signed)
Visit Information  Thank you for taking time to visit with me today. Please don't hesitate to contact me if I can be of assistance to you.   Following are the goals we discussed today:   Goals Addressed             This Visit's Progress    Care coordination activities       Care Coordination Interventions: SDOH screen completed Patient confirms that relationship with family has improved, will plan to continue to live independently, denies need for out of home placement Patient acknowledges frequent falls, importance of using her mobility devices to avoid additional falls  Patient continues on anti-depressant-currently managed by provider Patient plans to follow up with the [facility] in [place]-initial appointment scheduled for 08/23/22 Patient denied having any additional community resource needs at this time Patient encouraged to contact this social worker with any additional community resource needs          Please call the care guide team at [phone] if you need to cancel or reschedule your appointment.   If you are experiencing a Mental Health or [place] or need someone to talk to, please call 911   Patient verbalizes understanding of instructions and care plan provided today and agrees to view in [place]. Active MyChart status and patient understanding of how to access instructions and care plan via MyChart confirmed with patient.     No further follow up required: patient to call this Education officer, museum with any additional community resource needs  [employer], [place] Worker  [facility] Care Management [phone]

## 2022-08-22 NOTE — Patient Outreach (Signed)
  Care Coordination   Initial Visit Note   08/22/2022 Name: Teresa [name] MRN: [id number] DOB: Sep 29, 1945  Teresa [name] is a 77 y.o. year old female who sees Simmons-Robinson, Riki Sheer, MD for primary care. I spoke with  Teresa [name] by phone today.  What matters to the patients health and wellness today?  Patient denies having any community resource needs at this time.    Goals Addressed             This Visit's Progress    Care coordination activities       Care Coordination Interventions: SDOH screen completed Patient confirms that relationship with family has improved, will plan to continue to live independently, denies need for out of home placement Patient acknowledges frequent falls, importance of using her mobility devices to avoid additional falls  Patient continues on anti-depressant-currently managed by provider Patient plans to follow up with the [facility] in [place]-initial appointment scheduled for 08/23/22 Patient denied having any additional community resource needs at this time Patient encouraged to contact this Education officer, museum with any additional community resource needs         SDOH assessments and interventions completed:  Yes  SDOH Interventions Today    Flowsheet Row Most Recent Value  SDOH Interventions   Food Insecurity Interventions Intervention Not Indicated  Yolanda Bonine provides food]  Housing Interventions Intervention Not Indicated        Care Coordination Interventions:  Yes, provided   Follow up plan: No further intervention required.   Encounter Outcome:  Pt. Visit Completed

## 2022-08-23 DIAGNOSIS — R0902 Hypoxemia: Secondary | ICD-10-CM | POA: Diagnosis not present

## 2022-08-23 DIAGNOSIS — G63 Polyneuropathy in diseases classified elsewhere: Secondary | ICD-10-CM | POA: Diagnosis not present

## 2022-08-23 DIAGNOSIS — J849 Interstitial pulmonary disease, unspecified: Secondary | ICD-10-CM | POA: Diagnosis not present

## 2022-08-23 DIAGNOSIS — R634 Abnormal weight loss: Secondary | ICD-10-CM | POA: Diagnosis not present

## 2022-08-23 DIAGNOSIS — I2510 Atherosclerotic heart disease of native coronary artery without angina pectoris: Secondary | ICD-10-CM | POA: Diagnosis not present

## 2022-08-23 DIAGNOSIS — Z8616 Personal history of COVID-19: Secondary | ICD-10-CM | POA: Diagnosis not present

## 2022-08-23 DIAGNOSIS — E119 Type 2 diabetes mellitus without complications: Secondary | ICD-10-CM | POA: Diagnosis not present

## 2022-08-23 DIAGNOSIS — M19172 Post-traumatic osteoarthritis, left ankle and foot: Secondary | ICD-10-CM | POA: Diagnosis not present

## 2022-08-23 DIAGNOSIS — I10 Essential (primary) hypertension: Secondary | ICD-10-CM | POA: Diagnosis not present

## 2022-08-23 DIAGNOSIS — F411 Generalized anxiety disorder: Secondary | ICD-10-CM | POA: Diagnosis not present

## 2022-08-23 DIAGNOSIS — I4811 Longstanding persistent atrial fibrillation: Secondary | ICD-10-CM | POA: Diagnosis not present

## 2022-08-23 DIAGNOSIS — G901 Familial dysautonomia [Riley-Day]: Secondary | ICD-10-CM | POA: Diagnosis not present

## 2022-08-31 ENCOUNTER — Ambulatory Visit: Payer: HMO | Admitting: Physician Assistant

## 2022-09-01 DIAGNOSIS — G901 Familial dysautonomia [Riley-Day]: Secondary | ICD-10-CM | POA: Insufficient documentation

## 2022-09-07 ENCOUNTER — Ambulatory Visit: Payer: HMO | Admitting: Podiatry

## 2022-09-12 ENCOUNTER — Ambulatory Visit: Payer: HMO | Admitting: Podiatry

## 2022-09-14 ENCOUNTER — Ambulatory Visit: Payer: HMO | Admitting: Podiatry

## 2022-10-13 ENCOUNTER — Telehealth: Payer: Self-pay

## 2022-10-13 NOTE — Telephone Encounter (Signed)
Copied from [place] [phone]. Topic: General - Other >> Oct 13, 2022  8:47 AM Sabas Sous wrote: Reason for CRM: [facility] to see if fax was received from 10/11/2022.  Just requesting: gabapentin (NEURONTIN) 800 MG tablet  [facility] - [place], [place] - [address] [place] [address] Phone: [phone] Fax: [phone]

## 2022-10-13 NOTE — Telephone Encounter (Signed)
Copied from [place] [phone]. Topic: General - Other >> Oct 13, 2022  8:47 AM Sabas Sous wrote: Reason for CRM: [facility] to see if fax was received from 10/11/2022.  Just requesting: gabapentin (NEURONTIN) 800 MG tablet  [facility] - [place], [place] - [address] [place] [address] Phone: [phone] Fax: [phone]

## 2022-10-13 NOTE — Telephone Encounter (Signed)
Patient is established with [facility] since 08/2022

## 2022-10-13 NOTE — Telephone Encounter (Signed)
Copied from [place] [phone]. Topic: General - Other >> Oct 13, 2022  8:47 AM Sabas Sous wrote: Reason for CRM: [facility] to see if fax was received from 10/11/2022.  Just requesting: gabapentin (NEURONTIN) 800 MG tablet  [facility] - [place], [place] - [address] [place] [address] Phone: [phone] Fax: [phone]

## 2022-10-16 ENCOUNTER — Encounter: Payer: Self-pay | Admitting: Podiatry

## 2022-10-16 ENCOUNTER — Ambulatory Visit (INDEPENDENT_AMBULATORY_CARE_PROVIDER_SITE_OTHER): Payer: PPO | Admitting: Podiatry

## 2022-10-16 ENCOUNTER — Ambulatory Visit (INDEPENDENT_AMBULATORY_CARE_PROVIDER_SITE_OTHER): Payer: PPO

## 2022-10-16 VITALS — BP 133/79 | HR 63

## 2022-10-16 DIAGNOSIS — M79676 Pain in unspecified toe(s): Secondary | ICD-10-CM

## 2022-10-16 DIAGNOSIS — M19172 Post-traumatic osteoarthritis, left ankle and foot: Secondary | ICD-10-CM

## 2022-10-16 DIAGNOSIS — E1140 Type 2 diabetes mellitus with diabetic neuropathy, unspecified: Secondary | ICD-10-CM

## 2022-10-16 DIAGNOSIS — M960 Pseudarthrosis after fusion or arthrodesis: Secondary | ICD-10-CM | POA: Diagnosis not present

## 2022-10-16 DIAGNOSIS — B351 Tinea unguium: Secondary | ICD-10-CM

## 2022-10-17 NOTE — Progress Notes (Signed)
  Subjective:  Patient ID: Teresa [name], female    DOB: 11-28-1945,  MRN: [id number]  Chief Complaint  Patient presents with   Foot Pain    "It' okay, I guess.  It's swelling."   Nail Problem    "He is supposed to do my nails too."     77 y.o. female returns for post-op check from arthroscopic ankle fusion 03/03/2022.  She is doing well she is still having mild tenderness and swelling mostly at the end of the day.  She is doing most of her regular activities in regular shoes.  Not utilizing any bracing currently.  Her toenails are thick and elongated causing discomfort.  Previous debridements have been helpful  Review of Systems: Negative except as noted in the HPI. Denies N/V/F/Ch.   Objective:   Vitals:   10/16/22 1322  BP: 133/79  Pulse: 63   There is no height or weight on file to calculate BMI. Constitutional Well developed. Well nourished.  Vascular Foot warm and well perfused. Capillary refill normal to all digits.  Calf is soft and supple, no posterior calf or knee pain, negative Homans' sign  Neurologic Normal speech. Oriented to person, place, and time. Epicritic sensation to light touch grossly present bilaterally.  Dermatologic Incisions are now well-healed and not hypertrophic.  Thickened elongated yellow dystrophic toenails with subungual debris x 10  Orthopedic: No notable edema on exam today.  Appears symmetric with contralateral limb.  Mild tenderness and anteromedial portion of the joint.  Good stability and position noted.   Multiple view plain film radiographs: New films taken today show maintained alignment and 3 screw arthrodesis of ankle.  Significant increase in consolidation as compared to previous radiographs in the fusion site, there is still some lucency in the very most anterior and posterior portions of the joint Assessment:   1. Post-traumatic arthritis of left ankle   2. Nonunion after arthrodesis   3. Pain due to onychomycosis of toenail   4.  Type 2 diabetes mellitus with diabetic neuropathy, without long-term current use of insulin ([place])    Plan:  Patient was evaluated and treated and all questions answered.  S/p ankle surgery left -We reviewed her x-rays.  She is approximately 7 and half months out from surgery at this point I have seen a significant increase in consolidation across the fusion site with use of her noninvasive bone stimulator.  She has been using it for about 2 months now.  I recommend she continue to use and I will reevaluate new x-rays in 2 months.  She may continue her regular activity and shoe gear as tolerated.  I discussed with her I think her complete fusion will take anywhere from 9 months to a year for complete recovery and arthrodesis to be completed, considering her age and type 2 diabetes this is not expected  Discussed the etiology and treatment options for the condition in detail with the patient.  Previous nail debridements have been helpful in reducing pain and discomfort from the nails. Recommended debridement of the nails today. Sharp and mechanical debridement performed of all painful and mycotic nails today. Nails debrided in length and thickness using a nail nipper to level of comfort. Discussed treatment options including appropriate shoe gear. Follow up as needed for painful nails.     Return in about 2 months (around 12/16/2022) for new ankle xrays (f/u from ankle fusion July 2023).

## 2022-10-23 ENCOUNTER — Ambulatory Visit: Payer: HMO | Admitting: Podiatry

## 2022-10-24 NOTE — Telephone Encounter (Signed)
No further evaluation required

## 2022-10-25 ENCOUNTER — Ambulatory Visit: Payer: HMO | Admitting: Podiatry

## 2022-10-30 DIAGNOSIS — R079 Chest pain, unspecified: Secondary | ICD-10-CM | POA: Diagnosis not present

## 2022-10-30 DIAGNOSIS — I2510 Atherosclerotic heart disease of native coronary artery without angina pectoris: Secondary | ICD-10-CM | POA: Diagnosis not present

## 2022-10-30 DIAGNOSIS — J449 Chronic obstructive pulmonary disease, unspecified: Secondary | ICD-10-CM | POA: Diagnosis not present

## 2022-10-30 DIAGNOSIS — I10 Essential (primary) hypertension: Secondary | ICD-10-CM | POA: Diagnosis not present

## 2022-10-30 DIAGNOSIS — E782 Mixed hyperlipidemia: Secondary | ICD-10-CM | POA: Diagnosis not present

## 2022-10-30 DIAGNOSIS — E669 Obesity, unspecified: Secondary | ICD-10-CM | POA: Diagnosis not present

## 2022-10-30 DIAGNOSIS — R42 Dizziness and giddiness: Secondary | ICD-10-CM | POA: Diagnosis not present

## 2022-10-30 DIAGNOSIS — I5032 Chronic diastolic (congestive) heart failure: Secondary | ICD-10-CM | POA: Diagnosis not present

## 2022-10-30 DIAGNOSIS — E119 Type 2 diabetes mellitus without complications: Secondary | ICD-10-CM | POA: Diagnosis not present

## 2022-10-30 DIAGNOSIS — I480 Paroxysmal atrial fibrillation: Secondary | ICD-10-CM | POA: Diagnosis not present

## 2022-10-31 ENCOUNTER — Emergency Department
Admission: EM | Admit: 2022-10-31 | Discharge: 2022-10-31 | Disposition: A | Discharge status: Home / Self Care | Payer: PPO | Attending: Student in an Organized Health Care Education/Training Program | Admitting: Student in an Organized Health Care Education/Training Program

## 2022-10-31 ENCOUNTER — Emergency Department: Payer: PPO

## 2022-10-31 ENCOUNTER — Other Ambulatory Visit: Payer: Self-pay

## 2022-10-31 ENCOUNTER — Encounter: Payer: Self-pay | Admitting: Intensive Care

## 2022-10-31 DIAGNOSIS — R102 Pelvic and perineal pain: Secondary | ICD-10-CM | POA: Insufficient documentation

## 2022-10-31 DIAGNOSIS — S300XXA Contusion of lower back and pelvis, initial encounter: Secondary | ICD-10-CM | POA: Insufficient documentation

## 2022-10-31 DIAGNOSIS — W19XXXA Unspecified fall, initial encounter: Secondary | ICD-10-CM

## 2022-10-31 DIAGNOSIS — M533 Sacrococcygeal disorders, not elsewhere classified: Secondary | ICD-10-CM | POA: Diagnosis not present

## 2022-10-31 DIAGNOSIS — I110 Hypertensive heart disease with heart failure: Secondary | ICD-10-CM | POA: Diagnosis not present

## 2022-10-31 DIAGNOSIS — Z7901 Long term (current) use of anticoagulants: Secondary | ICD-10-CM | POA: Insufficient documentation

## 2022-10-31 DIAGNOSIS — M25552 Pain in left hip: Secondary | ICD-10-CM | POA: Diagnosis not present

## 2022-10-31 DIAGNOSIS — I509 Heart failure, unspecified: Secondary | ICD-10-CM | POA: Insufficient documentation

## 2022-10-31 DIAGNOSIS — S161XXA Strain of muscle, fascia and tendon at neck level, initial encounter: Secondary | ICD-10-CM

## 2022-10-31 DIAGNOSIS — R519 Headache, unspecified: Secondary | ICD-10-CM | POA: Diagnosis present

## 2022-10-31 DIAGNOSIS — S7002XA Contusion of left hip, initial encounter: Secondary | ICD-10-CM

## 2022-10-31 DIAGNOSIS — S0990XA Unspecified injury of head, initial encounter: Secondary | ICD-10-CM

## 2022-10-31 DIAGNOSIS — I4891 Unspecified atrial fibrillation: Secondary | ICD-10-CM | POA: Diagnosis not present

## 2022-10-31 DIAGNOSIS — Z043 Encounter for examination and observation following other accident: Secondary | ICD-10-CM | POA: Diagnosis not present

## 2022-10-31 DIAGNOSIS — W010XXA Fall on same level from slipping, tripping and stumbling without subsequent striking against object, initial encounter: Secondary | ICD-10-CM | POA: Diagnosis not present

## 2022-10-31 DIAGNOSIS — R29818 Other symptoms and signs involving the nervous system: Secondary | ICD-10-CM | POA: Diagnosis not present

## 2022-10-31 DIAGNOSIS — I6782 Cerebral ischemia: Secondary | ICD-10-CM | POA: Diagnosis not present

## 2022-10-31 DIAGNOSIS — G9389 Other specified disorders of brain: Secondary | ICD-10-CM | POA: Diagnosis not present

## 2022-10-31 MED ORDER — OXYCODONE-ACETAMINOPHEN 5-325 MG PO TABS
1.0000 | ORAL_TABLET | Freq: Once | ORAL | Status: AC
  Administered 2022-10-31: 1 via ORAL
  Filled 2022-10-31: qty 1

## 2022-10-31 NOTE — ED Provider Notes (Signed)
[facility] Provider Note    Event Date/Time   First MD Initiated Contact with Patient 10/31/22 1321     (approximate)   History   Fall   HPI  Teresa [name] is a 77 y.o. female with history of hypertension, CHF, A-fib, and multiple other chronic medical problems presents emergency department after a fall.  Patient states she had surgery on the left foot a while back and does not operate well.  States that she just fell because she stepped wrong.  Did not hit her head but is also on Eliquis.  Complaining of headache, neck pain, low back pain, and left hip pain.  Denies any LOC      Physical Exam   Triage Vital Signs: ED Triage Vitals  Enc Vitals Group     BP 10/31/22 1310 126/68     Pulse Rate 10/31/22 1310 66     Resp 10/31/22 1310 18     Temp 10/31/22 1310 98.5 F (36.9 C)     Temp Source 10/31/22 1310 Oral     SpO2 10/31/22 1310 96 %     Weight 10/31/22 1305 181 lb (82.1 kg)     Height 10/31/22 1305 5\' 2"  (1.575 m)     Head Circumference --      Peak Flow --      Pain Score 10/31/22 1305 10     Pain Loc --      Pain Edu? --      Excl. in [place]? --     Most recent vital signs: Vitals:   10/31/22 1310  BP: 126/68  Pulse: 66  Resp: 18  Temp: 98.5 F (36.9 C)  SpO2: 96%     General: Awake, no distress.   CV:  Good peripheral perfusion. regular rate and  rhythm Resp:  Normal effort. Lungs cta Abd:  No distention.   Other:     ED Results / Procedures / Treatments   Labs (all labs ordered are listed, but only abnormal results are displayed) Labs Reviewed - No data to display   EKG     RADIOLOGY CT of the head, C-spine, pelvis, lumbar spine X-ray of the left hip    PROCEDURES:   Procedures   MEDICATIONS ORDERED IN ED: Medications  oxyCODONE-acetaminophen (PERCOCET/ROXICET) 5-325 MG per tablet 1 tablet (has no administration in time range)     IMPRESSION / MDM / ASSESSMENT AND PLAN / ED COURSE  I reviewed the  triage vital signs and the nursing notes.                              Differential diagnosis includes, but is not limited to, subdural, subarachnoid, CVA, C-spine fracture, fractures, contusions, strain  Patient's presentation is most consistent with acute presentation with potential threat to life or bodily function.   CT of the cervical spine, lumbar spine, and pelvis were all independently reviewed and interpreted by me as being negative for any acute abnormality.  I did review the radiologist report.  CT of the head, I did review the radiologist report, he is concerned there may be a hemorrhagic stroke versus a shear injury.  Due to patient being on Eliquis we will go ahead with MRI of the brain  MRI of the brain, patient was given Percocet p.o. for pain   Care transferred to [facility], FNP, plan is to evaluate MRI, discharge if negative, pain medications,  FINAL CLINICAL IMPRESSION(S) / ED DIAGNOSES   Final diagnoses:  Fall, initial encounter  Injury of head, initial encounter  Contusion of left hip, initial encounter  Lumbar contusion, initial encounter  Strain of neck muscle, initial encounter     Rx / DC Orders   ED Discharge Orders     None        Note:  This document was prepared using Dragon voice recognition software and may include unintentional dictation errors.    Teresa Starks, PA-C 10/31/22 1524    Merlyn Lot, MD 10/31/22 [phone]

## 2022-10-31 NOTE — ED Notes (Signed)
Patient in xray at this time.

## 2022-10-31 NOTE — ED Provider Notes (Signed)
-----------------------------------------   3:50 PM on 10/31/2022 -----------------------------------------  Blood pressure 126/68, pulse 66, temperature 98.5 F (36.9 C), temperature source Oral, resp. rate 18, height 5\' 2"  (1.575 m), weight 82.1 kg, SpO2 96 %.  Assuming care from Ashok Cordia, PA-C.  In short, Teresa [name] is a 77 y.o. female with a chief complaint of Fall .  Refer to the original H&P for additional details.  The current plan of care is to follow up on results of MRI and determine disposition.  ----------------------------------------- 5:08 PM on 10/31/2022 -----------------------------------------  MRI of the brain is negative for concerns.  No acute infarct or intracranial hemorrhage is noted. Results discussed with the patient. Plan will be to discharge her home. Prescription for pain medication offered; patient declined. She is to follow up with her primary care provider. ER return precautions discussed.    Victorino Dike, FNP 10/31/22 1714    Merlyn Lot, MD 11/01/22 [phone]

## 2022-11-02 ENCOUNTER — Telehealth: Payer: Self-pay

## 2022-11-02 NOTE — Telephone Encounter (Signed)
        Patient  visited [place] on 3/26    Telephone encounter attempt : 1st   A HIPAA compliant voice message was left requesting a return call.  Instructed patient to call back    [facility] [phone] [address], [place], [place] [address] Phone: [phone] Email: [email or web address]

## 2022-11-03 ENCOUNTER — Telehealth: Payer: Self-pay

## 2022-11-03 NOTE — Telephone Encounter (Signed)
        Patient  visited [place] on 3/26    Telephone encounter attempt :  2nd  A HIPAA compliant voice message was left requesting a return call.  Instructed patient to call back    [facility] [phone] [address], [place], [place] [address] Phone: [phone] Email: [email or web address]

## 2022-11-15 DIAGNOSIS — E119 Type 2 diabetes mellitus without complications: Secondary | ICD-10-CM | POA: Diagnosis not present

## 2022-11-22 DIAGNOSIS — M19172 Post-traumatic osteoarthritis, left ankle and foot: Secondary | ICD-10-CM | POA: Diagnosis not present

## 2022-11-22 DIAGNOSIS — E118 Type 2 diabetes mellitus with unspecified complications: Secondary | ICD-10-CM | POA: Diagnosis not present

## 2022-11-22 DIAGNOSIS — G4733 Obstructive sleep apnea (adult) (pediatric): Secondary | ICD-10-CM | POA: Diagnosis not present

## 2022-11-22 DIAGNOSIS — M349 Systemic sclerosis, unspecified: Secondary | ICD-10-CM | POA: Diagnosis not present

## 2022-11-22 DIAGNOSIS — M25552 Pain in left hip: Secondary | ICD-10-CM | POA: Diagnosis not present

## 2022-11-22 DIAGNOSIS — I4811 Longstanding persistent atrial fibrillation: Secondary | ICD-10-CM | POA: Diagnosis not present

## 2022-11-22 DIAGNOSIS — Z6836 Body mass index (BMI) 36.0-36.9, adult: Secondary | ICD-10-CM | POA: Diagnosis not present

## 2022-11-22 DIAGNOSIS — I700 Atherosclerosis of aorta: Secondary | ICD-10-CM | POA: Diagnosis not present

## 2022-11-22 DIAGNOSIS — W010XXA Fall on same level from slipping, tripping and stumbling without subsequent striking against object, initial encounter: Secondary | ICD-10-CM | POA: Diagnosis not present

## 2022-11-22 DIAGNOSIS — I10 Essential (primary) hypertension: Secondary | ICD-10-CM | POA: Diagnosis not present

## 2022-12-18 ENCOUNTER — Ambulatory Visit (INDEPENDENT_AMBULATORY_CARE_PROVIDER_SITE_OTHER): Payer: PPO

## 2022-12-18 ENCOUNTER — Ambulatory Visit (INDEPENDENT_AMBULATORY_CARE_PROVIDER_SITE_OTHER): Payer: PPO | Admitting: Podiatry

## 2022-12-18 DIAGNOSIS — M19172 Post-traumatic osteoarthritis, left ankle and foot: Secondary | ICD-10-CM | POA: Diagnosis not present

## 2022-12-18 NOTE — Progress Notes (Signed)
  Subjective:  Patient ID: Teresa [name], female    DOB: [birth year],  MRN: [id number]  Chief Complaint  Patient presents with   Arthritis    2 month Follow up left ankle      77 y.o. female returns for post-op check from arthroscopic ankle fusion 03/03/2022.  She returns for follow-up she is still utilizing the bone stimulator.  She is in regular shoes.  Still has aching pain in the front of the ankle.  Review of Systems: Negative except as noted in the HPI. Denies N/V/F/Ch.   Objective:   There were no vitals filed for this visit.  There is no height or weight on file to calculate BMI. Constitutional Well developed. Well nourished.  Vascular Foot warm and well perfused. Capillary refill normal to all digits.  Calf is soft and supple, no posterior calf or knee pain, negative Homans' sign  Neurologic Normal speech. Oriented to person, place, and time. Epicritic sensation to light touch grossly present bilaterally.  Dermatologic Incisions are now well-healed and not hypertrophic.  Thickened elongated yellow dystrophic toenails with subungual debris x 10  Orthopedic: No notable edema on exam today.  Appears symmetric with contralateral limb.  Mild tenderness anterior joint line.  She does have an 8 to 10 mm limb length discrepancy on the left side   Multiple view plain film radiographs: New films taken today show maintained alignment and 3 screw arthrodesis of ankle.  Still some lucency present in anterior and posterior most portions of the joint on the lateral view Assessment:   1. Post-traumatic arthritis of left ankle    Plan:  Patient was evaluated and treated and all questions answered.  S/p ankle surgery left -Still gradually improving.  I recommend she continue to utilize her bone stimulator.  I would like to order a CT scan to assess the percentage of healing.  I will see her back in 3 months for follow-up for a 1 year visit.  I applied a heel lift to her inserts on the  left foot today, hopefully this can alleviate some of this as well.  May need to integrate this into her orthotics going forward.    Return in about 3 months (around 03/07/2023) for 1 year surgery f/u L ankle fusion.

## 2022-12-18 NOTE — Patient Instructions (Signed)
Call [facility] to schedule your CT at the below locations.  Please allow at least 1 business day after your visit to process the referral.  It may take longer depending on approval from insurance.  Please let me know if you have issues or problems scheduling the CT   [facility] [place] [phone] [address] [place], [place] [address]    

## 2022-12-25 DIAGNOSIS — E119 Type 2 diabetes mellitus without complications: Secondary | ICD-10-CM | POA: Diagnosis not present

## 2022-12-25 DIAGNOSIS — Z961 Presence of intraocular lens: Secondary | ICD-10-CM | POA: Diagnosis not present

## 2022-12-25 DIAGNOSIS — H26491 Other secondary cataract, right eye: Secondary | ICD-10-CM | POA: Diagnosis not present

## 2022-12-25 DIAGNOSIS — H43813 Vitreous degeneration, bilateral: Secondary | ICD-10-CM | POA: Diagnosis not present

## 2022-12-26 ENCOUNTER — Ambulatory Visit
Admission: RE | Admit: 2022-12-26 | Discharge: 2022-12-26 | Disposition: A | Discharge status: Home / Self Care | Payer: PPO | Source: Ambulatory Visit | Attending: Podiatry | Admitting: Podiatry

## 2022-12-26 DIAGNOSIS — M19172 Post-traumatic osteoarthritis, left ankle and foot: Secondary | ICD-10-CM | POA: Diagnosis not present

## 2023-01-18 ENCOUNTER — Ambulatory Visit (INDEPENDENT_AMBULATORY_CARE_PROVIDER_SITE_OTHER): Payer: PPO | Admitting: Podiatry

## 2023-01-18 ENCOUNTER — Encounter: Payer: Self-pay | Admitting: Podiatry

## 2023-01-18 VITALS — BP 142/69 | HR 67

## 2023-01-18 DIAGNOSIS — E119 Type 2 diabetes mellitus without complications: Secondary | ICD-10-CM

## 2023-01-18 DIAGNOSIS — M79676 Pain in unspecified toe(s): Secondary | ICD-10-CM

## 2023-01-18 DIAGNOSIS — B351 Tinea unguium: Secondary | ICD-10-CM | POA: Diagnosis not present

## 2023-01-18 DIAGNOSIS — E1140 Type 2 diabetes mellitus with diabetic neuropathy, unspecified: Secondary | ICD-10-CM | POA: Diagnosis not present

## 2023-01-21 NOTE — Progress Notes (Signed)
ANNUAL DIABETIC FOOT EXAM  Subjective: Teresa [name] presents today annual diabetic foot exam.  Chief Complaint  Patient presents with   Nail Problem    "Nails" Dr. Julieanne Manson - see him in July, glucose - 136    Patient confirms h/o diabetes.  Patient denies any h/o foot wounds.  Patient denies any numbness, tingling, burning, or pins/needle sensation in feet.  Risk factors: diabetes, diabetic neuropathy, HTN, CAD, CHF, hyperlipidemia, hypercholesterolemia.  Bosie Clos, MD is patient's PCP.  Past Medical History:  Diagnosis Date   Anginal pain (HCC)    Aortic atherosclerosis (HCC)    Arthritis    Asthma    Atrial fibrillation (HCC)    a.) CHA2DS2-VASc = 7 (age x2, sex, CHF, HTN, vascular disease history, T2DM);  b.) rate/rhythm maintained without pharmacological intervention; chronically anticoagulated using standard dose apixaban   Bilateral lower extremity edema    CHF (congestive heart failure) (HCC)    Complication of anesthesia    a.) PONV   COPD (chronic obstructive pulmonary disease) (HCC)    Coronary artery disease    a.) cCTA 11/01/2021: Ca+ score 174 (67th percentile for age/sex match) --> FFR demonstrated no hemodynamically significant stenosis   Depression    DOE (dyspnea on exertion)    GERD (gastroesophageal reflux disease)    Gout    Heart murmur    History of 2019 novel coronavirus disease (COVID-19) 05/12/2019   History of hiatal hernia    History of kidney stones    History of orthopnea    HLD (hyperlipidemia)    Hypertension    Hypothyroidism    Long term current use of anticoagulant    a.) apixaban   Migraines    Mobitz type I Wenckebach atrioventricular block    a.) holter study 01/27/2022   Palpitations    PONV (postoperative nausea and vomiting)    PSVT (paroxysmal supraventricular tachycardia) 01/27/2022   a.) holter study 01/27/2022 - longest lasting 17 beats at a rate of 109 bpm   Pulmonary HTN (HCC)    RBBB (right  bundle branch block)    Scleroderma (HCC)    hands   Sleep apnea    a.) does not require nocturnal PAP therapy   T2DM (type 2 diabetes mellitus) (HCC)    Vertigo    Vitamin D deficiency    Patient Active Problem List   Diagnosis Date Noted   Dysautonomia (HCC) 09/01/2022   Memory deficit 07/12/2022   Major osseous defect    Post-traumatic arthritis of left ankle    Pain due to internal orthopedic prosthetic devices, implants and grafts, initial encounter (HCC)    Status post ankle arthrodesis 03/03/2022   Osteopenia of multiple sites 12/12/2021   Seizure-like activity (HCC) 09/27/2021   Atrial fibrillation (HCC) 08/09/2021   Hypokalemia 06/13/2021   Chronic diastolic CHF (congestive heart failure) (HCC) 05/16/2019   Benign neoplasm of cecum    Diverticulosis of large intestine without diverticulitis    Polyneuropathy associated with underlying disease (HCC) 12/11/2016   Microcalcifications of the breast 12/15/2015   Back pain, chronic 06/28/2015   Chronic airway obstruction (HCC) 06/28/2015   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   Essential hypertension 06/21/2009   CAD (coronary artery disease) 06/21/2009   DYSPHAGIA 06/21/2009   RUQ PAIN 06/21/2009   HIATAL HERNIA 01/07/2008   Hypothyroidism 11/22/2007   Anxiety 11/22/2007   DEPRESSION 11/22/2007   Hypertensive heart disease without heart failure 11/22/2007   ANGINA PECTORIS 11/22/2007   RENAL CALCULUS 11/22/2007  Osteoarthritis 11/22/2007   Sleep apnea 11/22/2007   GASTRITIS 10/23/2007   DIVERTICULOSIS, COLON 10/23/2007   Type 2 diabetes mellitus with diabetic neuropathy, without long-term current use of insulin (HCC) 07/16/2007   HYPERCHOLESTEROLEMIA 07/16/2007   OBESITY, MODERATE 07/16/2007   ISCHEMIC HEART DISEASE 07/16/2007   ESOPHAGEAL REFLUX 07/16/2007   SCLERODERMA 07/16/2007   Past Surgical History:  Procedure Laterality Date   ABDOMINAL HYSTERECTOMY     ovaries intact   ANKLE ARTHROSCOPY Left  03/03/2022   Procedure: ANKLE ARTHROSCOPY;  Surgeon: Edwin Cap, DPM;  Location: [facility];  Service: Podiatry;  Laterality: Left;  GENERAL WITH POP AND SAPHENOUS BLOCK   BLADDER SURGERY     bladder tuck   BREAST BIOPSY Right 03/20/2016   neg x 2 area   CARDIAC CATHETERIZATION     CARPAL TUNNEL RELEASE     CATARACT EXTRACTION W/PHACO Left 09/17/2018   Procedure: CATARACT EXTRACTION PHACO AND INTRAOCULAR LENS PLACEMENT (IOC) LEFT, DIABETIC;  Surgeon: Galen Manila, MD;  Location: [facility];  Service: Ophthalmology;  Laterality: Left;  [place] 00:34 CDE 4.85 Fluid pack lot # [id number]   CATARACT EXTRACTION W/PHACO Right 10/15/2018   Procedure: CATARACT EXTRACTION PHACO AND INTRAOCULAR LENS PLACEMENT (IOC)-RIGHT;  Surgeon: Galen Manila, MD;  Location: [facility];  Service: Ophthalmology;  Laterality: Right;  [place] 00:27.6 CDE 3.43 Fluid Pack Lot # [id number]   COLONOSCOPY WITH PROPOFOL N/A 09/27/2018   Procedure: COLONOSCOPY WITH PROPOFOL;  Surgeon: Pasty Spillers, MD;  Location: [facility];  Service: Endoscopy;  Laterality: N/A;   DILATION AND CURETTAGE OF UTERUS     EYE SURGERY     eyelid   FRACTURE SURGERY     left ankle-plate and screws palced   GRAFT APPLICATION Left 03/03/2022   Procedure: BONE GRAFT;  Surgeon: Edwin Cap, DPM;  Location: [facility];  Service: Podiatry;  Laterality: Left;   HARDWARE REMOVAL Left 03/03/2022   Procedure: POSSIBLE HARDWARE REMOVAL;  Surgeon: Edwin Cap, DPM;  Location: [facility];  Service: Podiatry;  Laterality: Left;   LITHOTRIPSY     NISSEN FUNDOPLICATION     SHOULDER SURGERY Right    rotator cuff   TONSILLECTOMY     Current Outpatient Medications on File Prior to Visit  Medication Sig Dispense Refill   acetaminophen (TYLENOL) 500 MG tablet Take 1,000 mg by mouth every 6 (six) hours as needed for mild pain or headache.     albuterol (VENTOLIN HFA) 108 (90 Base) MCG/ACT inhaler Inhale 2 puffs into the lungs every 6 (six) hours as  needed for shortness of breath.     allopurinol (ZYLOPRIM) 100 MG tablet Take 1 tablet (100 mg total) by mouth daily. 90 tablet 0   apixaban (ELIQUIS) 5 MG TABS tablet Take 5 mg by mouth 2 (two) times daily.     atorvastatin (LIPITOR) 10 MG tablet TAKE ONE TABLET BY MOUTH EVERYDAY AT BEDTIME 90 tablet 2   colchicine 0.6 MG tablet Take 0.6 mg by mouth daily as needed (gout).     Continuous Blood Gluc Sensor (FREESTYLE LIBRE 2 SENSOR) MISC 1 each by Does not apply route every 14 (fourteen) days. 1 each 3   donepezil (ARICEPT) 5 MG tablet Take 1 tablet (5 mg total) by mouth at bedtime. 90 tablet 3   empagliflozin (JARDIANCE) 25 MG TABS tablet Take 1 tablet (25 mg total) by mouth daily. 90 tablet 0   furosemide (LASIX) 40 MG tablet Take 1 tablet (40 mg total) by mouth daily. 90 tablet  0   gabapentin (NEURONTIN) 800 MG tablet Take 1 tablet (800 mg total) by mouth 3 (three) times daily. 270 tablet 0   levothyroxine (SYNTHROID) 88 MCG tablet TAKE 1 TABLET BY MOUTH EVERY DAY 90 tablet 3   metFORMIN (GLUCOPHAGE) 1000 MG tablet Take 1 tablet (1,000 mg total) by mouth 2 (two) times daily. 180 tablet 0   nystatin (MYCOSTATIN/NYSTOP) powder Apply 1 Application topically 3 (three) times daily. 60 g 0   Omega-3 Fatty Acids (FISH OIL PO) Take 1 capsule by mouth daily.     ondansetron (ZOFRAN) 4 MG tablet TAKE 1 TABLET BY MOUTH EVERY 8 HOURS AS NEEDED FOR NAUSEA AND VOMITING 45 tablet 0   pioglitazone (ACTOS) 15 MG tablet Take 1 tablet (15 mg total) by mouth every morning. 90 tablet 0   promethazine (PHENERGAN) 12.5 MG tablet Take 1 tablet (12.5 mg total) by mouth every 6 (six) hours as needed for nausea or vomiting. 30 tablet 0   Semaglutide, 1 MG/DOSE, 4 MG/3ML SOPN Inject 1 mg as directed once a week. 9 mL 0   sertraline (ZOLOFT) 50 MG tablet Take 1 tablet (50 mg total) by mouth daily. 90 tablet 1   No current facility-administered medications on file prior to visit.    Allergies  Allergen Reactions    Clarithromycin Nausea And Vomiting and Other (See Comments)   Codeine     Hallucination   Dilaudid  [Hydromorphone Hcl] Nausea And Vomiting   Iodine Hives and Itching   Iohexol Hives and Itching   Onion Diarrhea and Nausea And Vomiting    Abdominal pain   Tamiflu  [Oseltamivir Phosphate] Nausea And Vomiting    Abdominal Pain,   Zolpidem Nausea And Vomiting and Other (See Comments)    Hallucinations   Bacitracin-Neomycin-Polymyxin Itching and Rash   Benzalkonium Chloride Itching, Rash and Swelling   Lidocaine Hcl Itching, Rash and Swelling   Morphine Nausea And Vomiting and Rash   Neomycin-Bacitracin Zn-Polymyx Itching and Rash   Tape Itching and Rash    Adhesive tape - silicone   Tapentadol Rash   Social History   Occupational History   Occupation: retired  Tobacco Use   Smoking status: Never   Smokeless tobacco: Never  Vaping Use   Vaping Use: Never used  Substance and Sexual Activity   Alcohol use: Never   Drug use: Never   Sexual activity: Not Currently   Family History  Problem Relation Age of Onset   Stroke Mother    Hypertension Mother    Heart disease Mother    Arthritis Mother    Heart disease Father    Hypertension Father    Diabetes Sister    Hypertension Sister    Asthma Sister    Hypertension Sister    Diverticulitis Sister    Colon cancer Maternal Grandmother    Breast cancer Maternal Grandmother    Breast cancer Maternal Aunt    Immunization History  Administered Date(s) Administered   Fluad Quad(high Dose 65+) 05/27/2019, 06/01/2020, 05/16/2021, 05/24/2022   Influenza, High Dose Seasonal PF 05/29/2016, 06/06/2017, 05/20/2018   PFIZER Comirnaty(Gray Top)Covid-19 Tri-Sucrose Vaccine 10/05/2020, 01/12/2021   PFIZER(Purple Top)SARS-COV-2 Vaccination 09/14/2020   Pfizer Covid-19 Vaccine Bivalent Booster 69yrs & up 09/15/2021   Pneumococcal Conjugate-13 05/21/2014   Pneumococcal Polysaccharide-23 11/29/2004, 07/11/2010, 12/08/2015, 05/27/2019   Td  07/05/1994   Tdap 05/11/2011   Zoster, Live 05/07/2008     Review of Systems: Negative except as noted in the HPI.   Objective: Vitals:  01/18/23 0927  BP: (!) 142/69  Pulse: 67    Lilianne R Webbe is a pleasant 77 y.o. female in NAD. AAO X 3.  Vascular Examination: Capillary refill time immediate b/l. Vascular status intact b/l with palpable pedal pulses. Pedal hair present b/l. No pain with calf compression b/l. Skin temperature gradient WNL b/l. No cyanosis or clubbing b/l. No ischemia or gangrene noted b/l.   Neurological Examination: Sensation grossly intact b/l with 10 gram monofilament. Vibratory sensation intact b/l. Pt has subjective symptoms of neuropathy.    Dermatological Examination: Pedal skin with normal turgor, texture and tone b/l.  No open wounds. No interdigital macerations.   Toenails 1-5 b/l thick, discolored, elongated with subungual debris and pain on dorsal palpation.   No hyperkeratotic nor porokeratotic lesions present on today's visit.  Musculoskeletal Examination: Muscle strength 5/5 to all LE muscle groups of right lower extremity. Bone stimulator in place LLE.  Radiographs: None  Last A1c:      Latest Ref Rng & Units 07/12/2022    3:25 PM 02/22/2022    9:03 AM  Hemoglobin A1C  Hemoglobin-A1c 4.0 - 5.6 % 6.7  7.4     Lab Results  Component Value Date   HGBA1C 6.7 (A) 07/12/2022   ADA Risk Categorization: Low Risk :  Patient has all of the following: Intact protective sensation No prior foot ulcer  No severe deformity Pedal pulses present  Assessment: 1. Pain due to onychomycosis of toenail   2. Type 2 diabetes mellitus with diabetic neuropathy, without long-term current use of insulin (HCC)   3. Encounter for diabetic foot exam [facility])     Plan: -Patient was evaluated and treated. All patient's and/or POA's questions/concerns answered on today's visit. -Diabetic foot examination performed today. -Continue diabetic foot care  principles: inspect feet daily, monitor glucose as recommended by PCP and/or Endocrinologist, and follow prescribed diet per PCP, Endocrinologist and/or dietician. -Patient to continue soft, supportive shoe gear daily. -Toenails 1-5 b/l were debrided in length and girth with sterile nail nippers and dremel without iatrogenic bleeding.  -Patient/POA to call should there be question/concern in the interim. Return in about 3 months (around 04/20/2023).  Freddie Breech, DPM

## 2023-02-22 DIAGNOSIS — R062 Wheezing: Secondary | ICD-10-CM | POA: Diagnosis not present

## 2023-02-22 DIAGNOSIS — J4540 Moderate persistent asthma, uncomplicated: Secondary | ICD-10-CM | POA: Diagnosis not present

## 2023-02-23 DIAGNOSIS — M25551 Pain in right hip: Secondary | ICD-10-CM | POA: Diagnosis not present

## 2023-02-23 DIAGNOSIS — J849 Interstitial pulmonary disease, unspecified: Secondary | ICD-10-CM | POA: Diagnosis not present

## 2023-02-23 DIAGNOSIS — M25552 Pain in left hip: Secondary | ICD-10-CM | POA: Diagnosis not present

## 2023-02-23 DIAGNOSIS — E118 Type 2 diabetes mellitus with unspecified complications: Secondary | ICD-10-CM | POA: Diagnosis not present

## 2023-02-23 DIAGNOSIS — R2689 Other abnormalities of gait and mobility: Secondary | ICD-10-CM | POA: Diagnosis not present

## 2023-02-23 DIAGNOSIS — M19172 Post-traumatic osteoarthritis, left ankle and foot: Secondary | ICD-10-CM | POA: Diagnosis not present

## 2023-02-23 DIAGNOSIS — Z6836 Body mass index (BMI) 36.0-36.9, adult: Secondary | ICD-10-CM | POA: Diagnosis not present

## 2023-02-23 DIAGNOSIS — G63 Polyneuropathy in diseases classified elsewhere: Secondary | ICD-10-CM | POA: Diagnosis not present

## 2023-02-23 DIAGNOSIS — I4811 Longstanding persistent atrial fibrillation: Secondary | ICD-10-CM | POA: Diagnosis not present

## 2023-02-23 DIAGNOSIS — I700 Atherosclerosis of aorta: Secondary | ICD-10-CM | POA: Diagnosis not present

## 2023-02-23 DIAGNOSIS — M5417 Radiculopathy, lumbosacral region: Secondary | ICD-10-CM | POA: Diagnosis not present

## 2023-02-28 ENCOUNTER — Ambulatory Visit
Admission: RE | Admit: 2023-02-28 | Discharge: 2023-02-28 | Disposition: A | Discharge status: Home / Self Care | Payer: PPO | Source: Ambulatory Visit | Attending: Orthopedic Surgery | Admitting: Orthopedic Surgery

## 2023-02-28 ENCOUNTER — Other Ambulatory Visit: Payer: Self-pay | Admitting: Orthopedic Surgery

## 2023-02-28 DIAGNOSIS — M48061 Spinal stenosis, lumbar region without neurogenic claudication: Secondary | ICD-10-CM | POA: Diagnosis not present

## 2023-02-28 DIAGNOSIS — E119 Type 2 diabetes mellitus without complications: Secondary | ICD-10-CM | POA: Diagnosis not present

## 2023-02-28 DIAGNOSIS — Z981 Arthrodesis status: Secondary | ICD-10-CM

## 2023-02-28 DIAGNOSIS — M5116 Intervertebral disc disorders with radiculopathy, lumbar region: Secondary | ICD-10-CM | POA: Diagnosis not present

## 2023-02-28 DIAGNOSIS — S335XXA Sprain of ligaments of lumbar spine, initial encounter: Secondary | ICD-10-CM | POA: Diagnosis not present

## 2023-02-28 DIAGNOSIS — M545 Low back pain, unspecified: Secondary | ICD-10-CM | POA: Insufficient documentation

## 2023-02-28 DIAGNOSIS — E669 Obesity, unspecified: Secondary | ICD-10-CM | POA: Diagnosis not present

## 2023-02-28 DIAGNOSIS — M4726 Other spondylosis with radiculopathy, lumbar region: Secondary | ICD-10-CM | POA: Diagnosis not present

## 2023-03-07 ENCOUNTER — Ambulatory Visit (INDEPENDENT_AMBULATORY_CARE_PROVIDER_SITE_OTHER): Payer: PPO | Admitting: Podiatry

## 2023-03-07 ENCOUNTER — Encounter: Payer: Self-pay | Admitting: Podiatry

## 2023-03-07 ENCOUNTER — Ambulatory Visit (INDEPENDENT_AMBULATORY_CARE_PROVIDER_SITE_OTHER): Payer: PPO

## 2023-03-07 DIAGNOSIS — M960 Pseudarthrosis after fusion or arthrodesis: Secondary | ICD-10-CM

## 2023-03-07 DIAGNOSIS — M19172 Post-traumatic osteoarthritis, left ankle and foot: Secondary | ICD-10-CM

## 2023-03-07 DIAGNOSIS — M79672 Pain in left foot: Secondary | ICD-10-CM | POA: Diagnosis not present

## 2023-03-07 DIAGNOSIS — Z9889 Other specified postprocedural states: Secondary | ICD-10-CM

## 2023-03-07 NOTE — Progress Notes (Signed)
  Subjective:  Patient ID: Teresa [name], female    DOB: 1945-09-24,  MRN: [id number]  Chief Complaint  Patient presents with   Routine Post Op    "It's the same."     77 y.o. female returns for post-op check from arthroscopic ankle fusion 03/03/2022.  She returns for 1 year follow-up, still does have pain when she walks she is dealing with a pelvic fracture right now as well and is not sure if she will have to have surgery.  Able to ambulate mostly in her regular shoes despite the pain in the ankle.  Review of Systems: Negative except as noted in the HPI. Denies N/V/F/Ch.   Objective:   There were no vitals filed for this visit.  There is no height or weight on file to calculate BMI. Constitutional Well developed. Well nourished.  Vascular Foot warm and well perfused. Capillary refill normal to all digits.  Calf is soft and supple, no posterior calf or knee pain, negative Homans' sign  Neurologic Normal speech. Oriented to person, place, and time. Epicritic sensation to light touch grossly present bilaterally.  Dermatologic Incisions are now well-healed and not hypertrophic.  Thickened elongated yellow dystrophic toenails with subungual debris x 10  Orthopedic: No notable edema on exam today.  Appears symmetric with contralateral limb no pain to palpate on the anterior joint line today.  She does have an 8 to 10 mm limb length discrepancy on the left side   Multiple view plain film radiographs: New films taken today show maintained alignment and 3 screw arthrodesis of ankle.  Posterior portions of the lucency have consolidated, still some in the anterior portions. Assessment:   1. Status post left foot surgery   2. Post-traumatic arthritis of left ankle   3. Nonunion after arthrodesis    Plan:  Patient was evaluated and treated and all questions answered.  S/p ankle surgery left -Again still seeing gradual improvement.  I reviewed her CT scan that was completed which showed  good majority fusion of the joint surface.  I would like her to continue to utilize the bone stim for another few months for a full 9 months of stimulation.  I will see her back in about 6 weeks for at risk diabetic footcare and we will continue to image the ankle as needed pending her progress.  Do not see indication for hard removal or revision at this point considering her progress    No follow-ups on file.

## 2023-03-19 DIAGNOSIS — M5416 Radiculopathy, lumbar region: Secondary | ICD-10-CM | POA: Diagnosis not present

## 2023-03-19 DIAGNOSIS — M5442 Lumbago with sciatica, left side: Secondary | ICD-10-CM | POA: Diagnosis not present

## 2023-03-19 DIAGNOSIS — R062 Wheezing: Secondary | ICD-10-CM | POA: Diagnosis not present

## 2023-03-19 DIAGNOSIS — M5441 Lumbago with sciatica, right side: Secondary | ICD-10-CM | POA: Diagnosis not present

## 2023-03-19 DIAGNOSIS — G8929 Other chronic pain: Secondary | ICD-10-CM | POA: Diagnosis not present

## 2023-03-19 DIAGNOSIS — J42 Unspecified chronic bronchitis: Secondary | ICD-10-CM | POA: Diagnosis not present

## 2023-03-23 DIAGNOSIS — M5416 Radiculopathy, lumbar region: Secondary | ICD-10-CM | POA: Diagnosis not present

## 2023-03-26 IMAGING — CT CT HEAD W/O CM
3 series · 15 of 45 positions shown, 18 images · non-contrast
Comparison: 05/14/2014.  Brain MR dated 11/13/2016.

CLINICAL DATA: Dizziness.  Multiple syncopal episodes.

EXAM:
CT HEAD WITHOUT CONTRAST
TECHNIQUE: Contiguous axial images were obtained from the base of the skull
through the vertex without intravenous contrast.

[Series 2: head wo · axial · 0.41mm/px · z∈[+1409,+1524]mm · 9 of 28 slices shown, 12 images]
[im 3/28  brain]
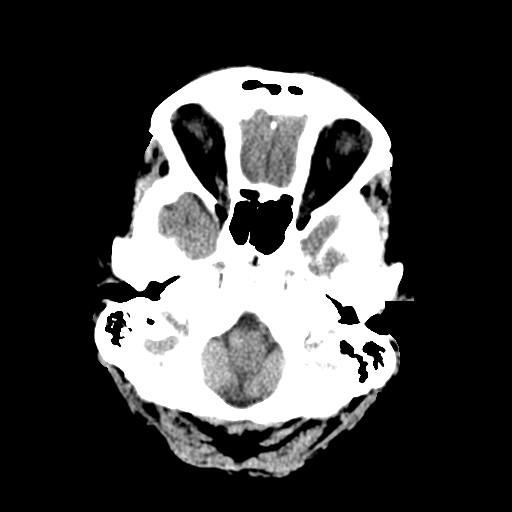
[im 3/28  bone]
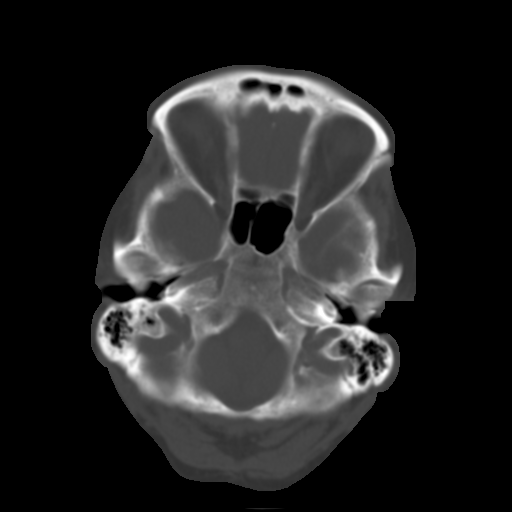
[im 6/28  brain]
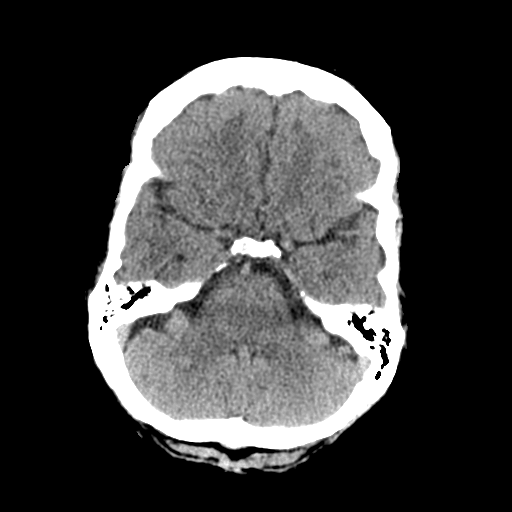
[im 9/28  brain]
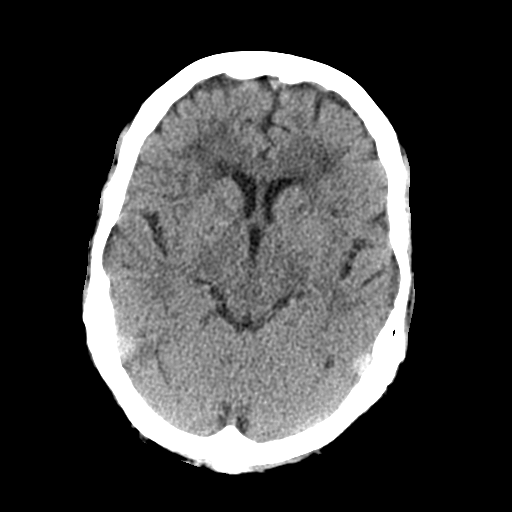
[im 12/28  brain]
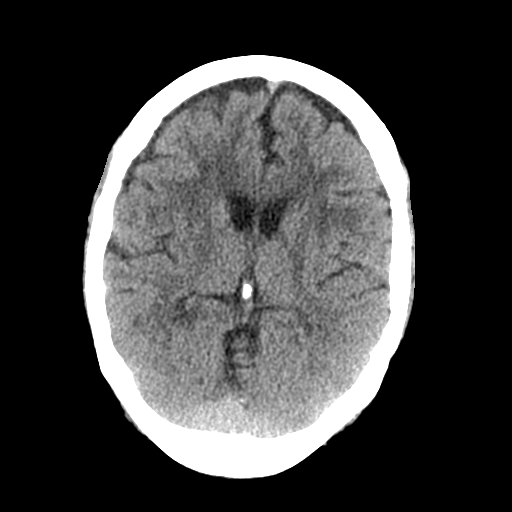
[im 15/28  brain]
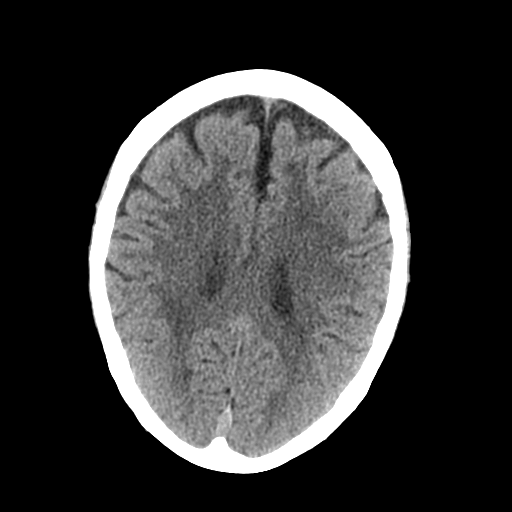
[im 15/28  bone]
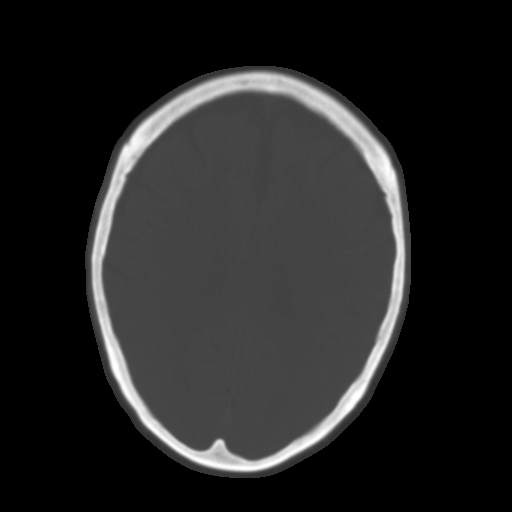
[im 17/28  brain]
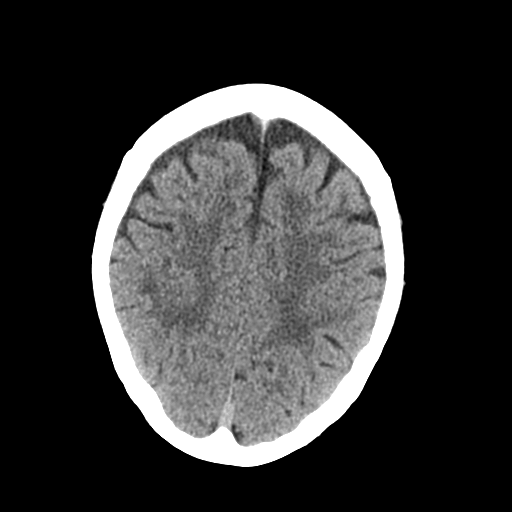
[im 20/28  brain]
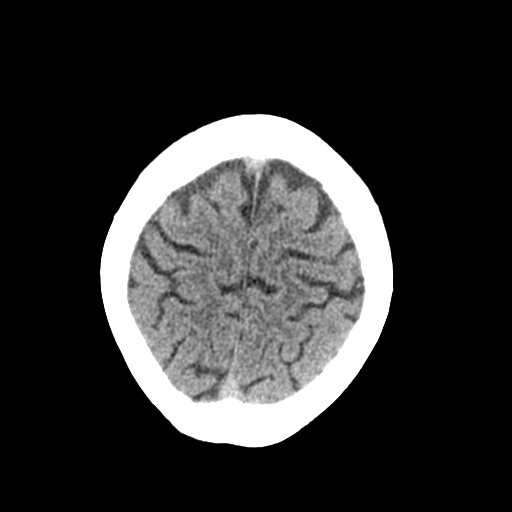
[im 23/28  brain]
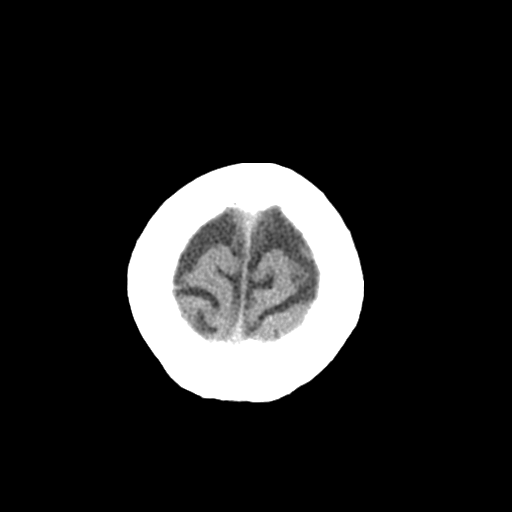
[im 26/28  brain]
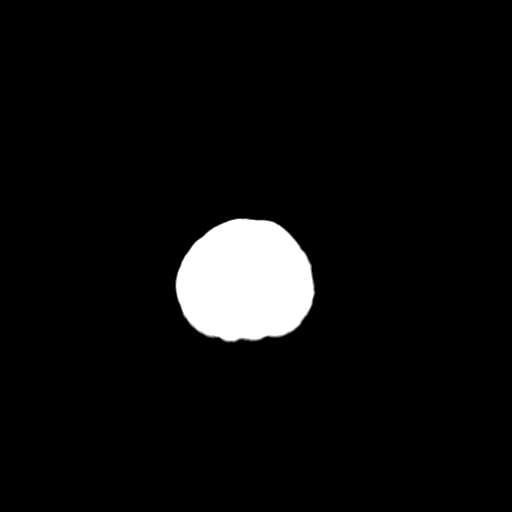
[im 26/28  bone]
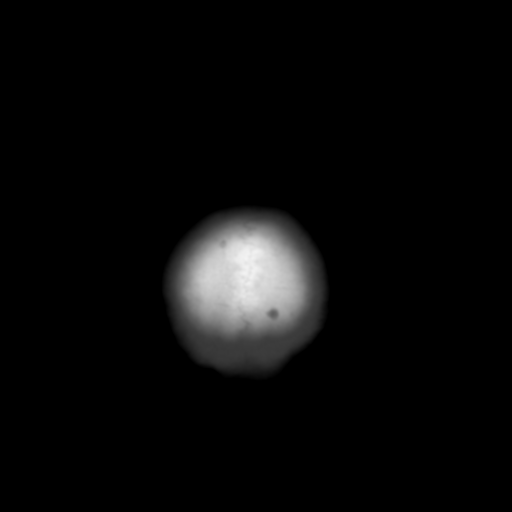

[Series 4: coronal soft tissue · coronal · 0.29mm/px · 3 of 60 slices shown]
[im 20/60  brain]
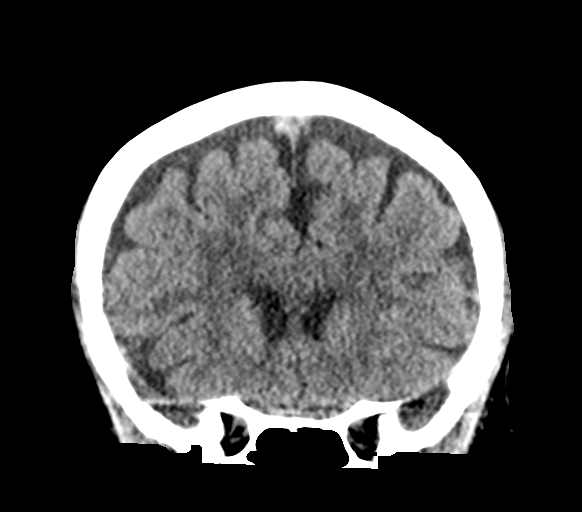
[im 27/60  brain]
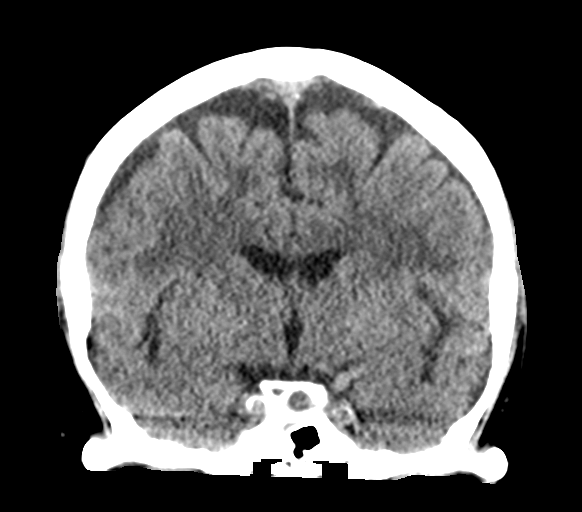
[im 33/60  brain]
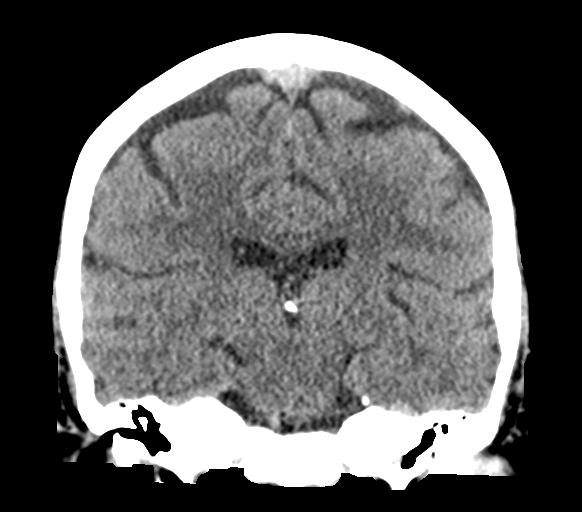

[Series 5: sagittal soft tissue · sagittal · 0.29mm/px · 3 of 49 slices shown]
[im 17/49  brain]
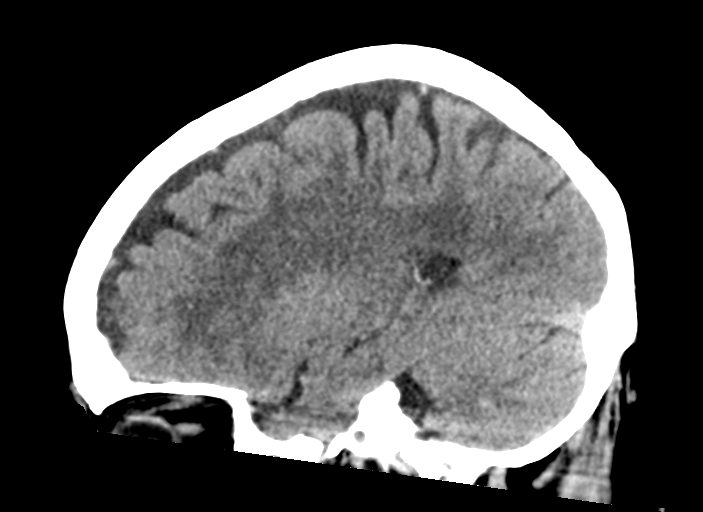
[im 25/49  brain]
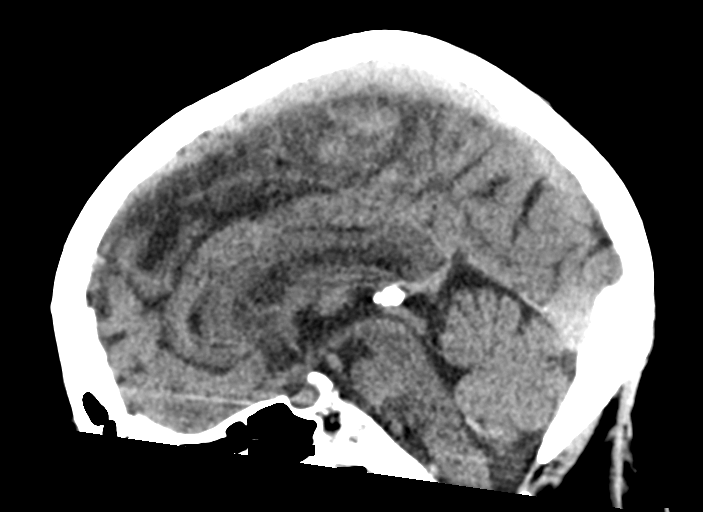
[im 33/49  brain]
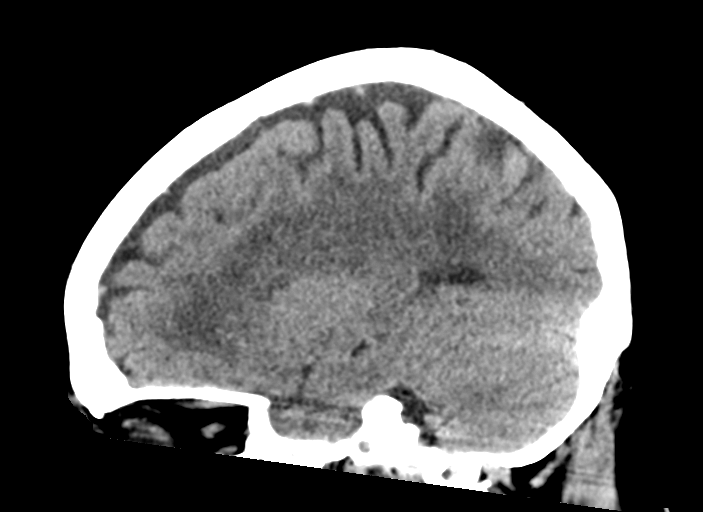

[15 of 45 positions shown; findings below may reference images not displayed]

FINDINGS: Brain: Mild to moderate patchy white matter low density in both
cerebral hemispheres without significant change since 2715. Normal
size and position of the ventricles. No intracranial hemorrhage,
mass lesion or CT evidence of acute infarction.

Vascular: No hyperdense vessel or unexpected calcification.

Skull: Normal. Negative for fracture or focal lesion.

Sinuses/Orbits: Unremarkable.

Other: None.
IMPRESSION: 1. No acute abnormality.
2. Stable mild-to-moderate chronic small vessel white matter
ischemic changes in both cerebral hemispheres.

## 2023-03-27 IMAGING — US US CAROTID DUPLEX BILAT
1 series · 14 of 24 positions shown · non-contrast
Comparison: None.

CLINICAL DATA: Syncope

Diabetes
Hypertension
EXAM:
BILATERAL CAROTID DUPLEX ULTRASOUND
TECHNIQUE: Gray scale imaging, color Doppler and duplex ultrasound were
performed of bilateral carotid and vertebral arteries in the neck.

[Series 1: us carotid bilateral · 14 of 69 slices shown]
[im 1/69]
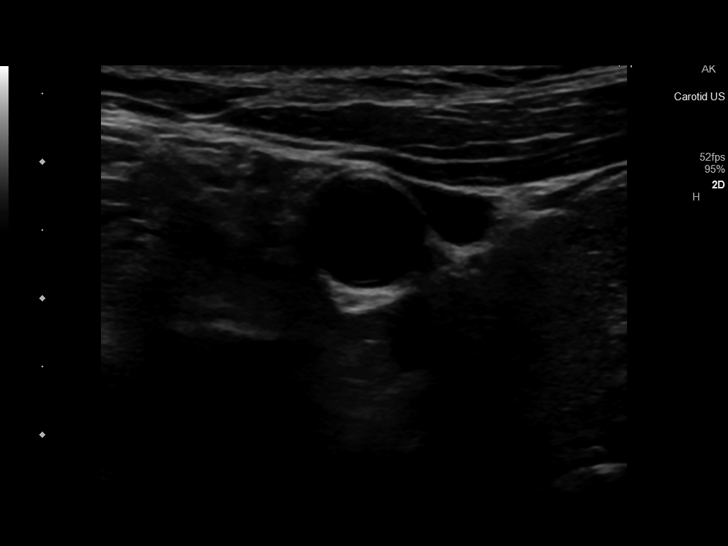
[im 6/69]
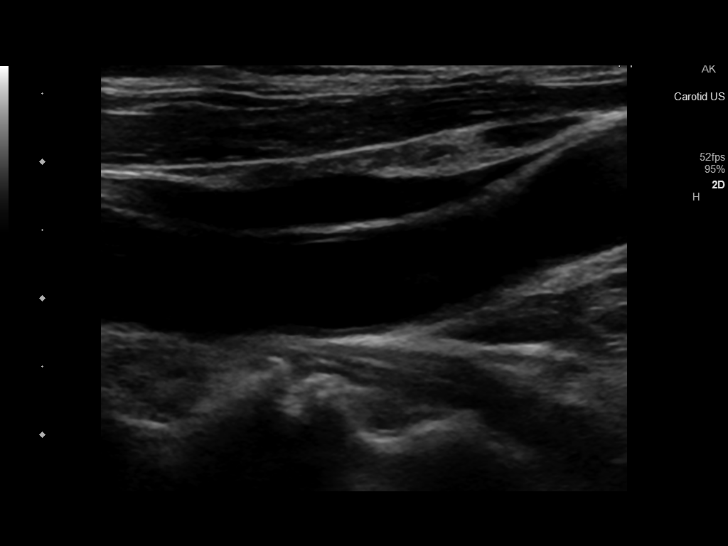
[im 12/69]
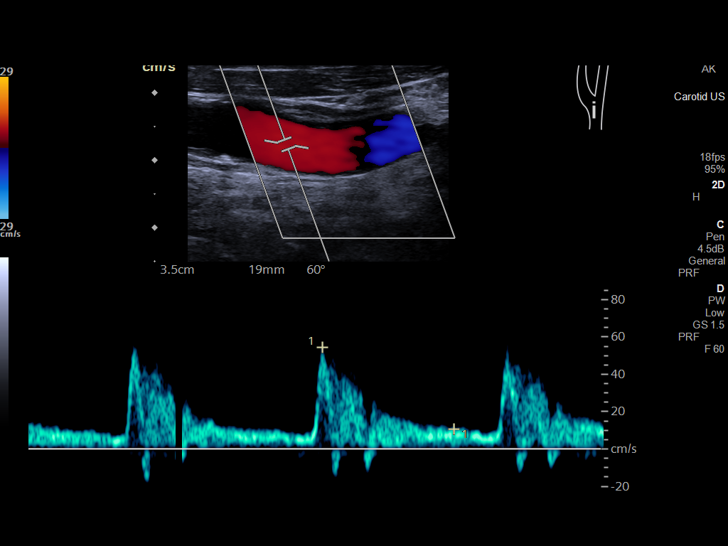
[im 18/69]
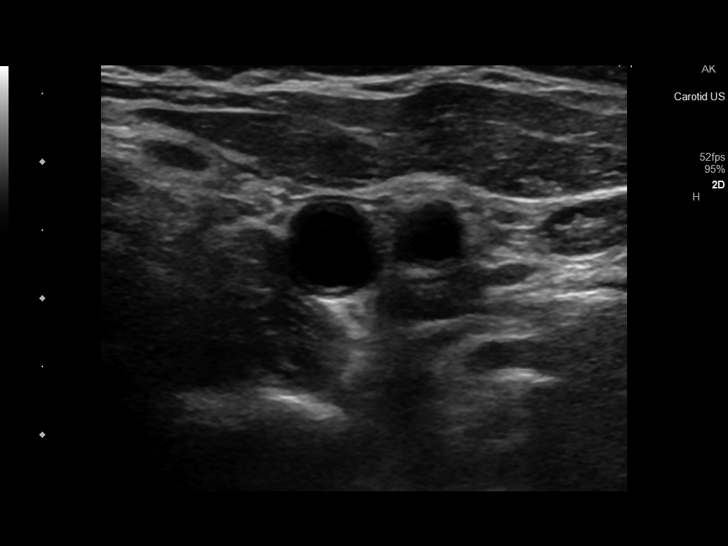
[im 21/69]
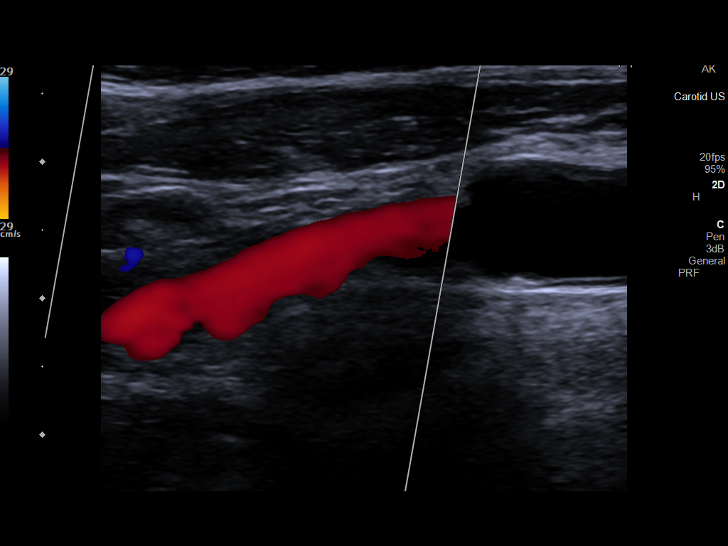
[im 27/69]
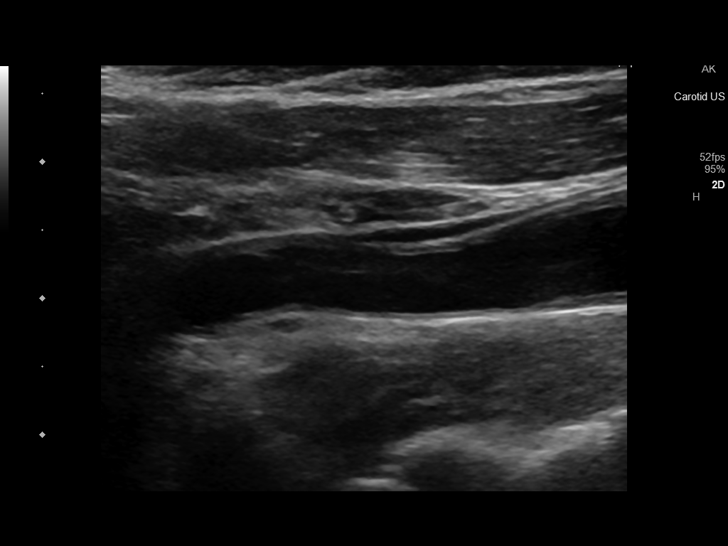
[im 33/69]
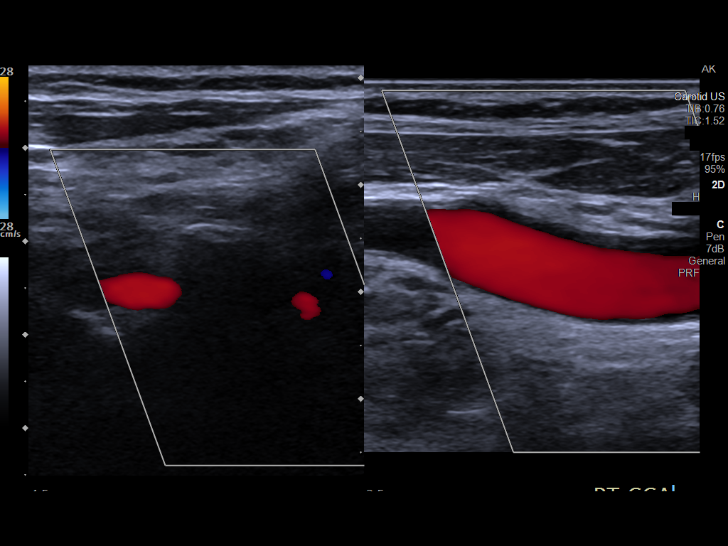
[im 36/69]
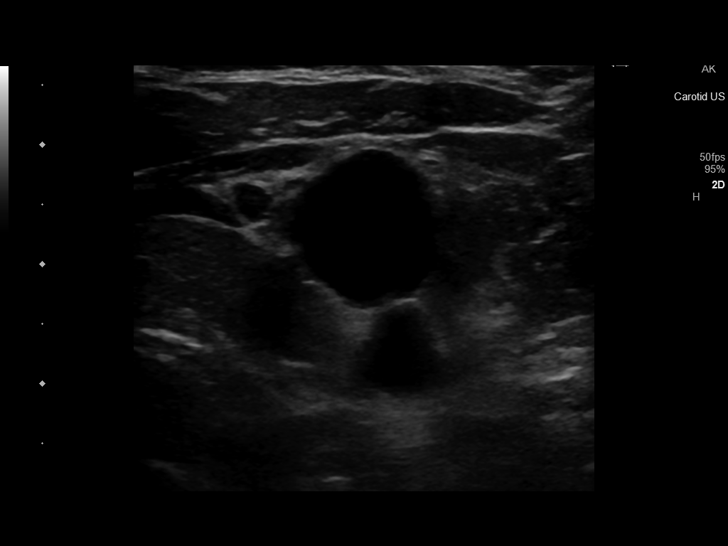
[im 42/69]
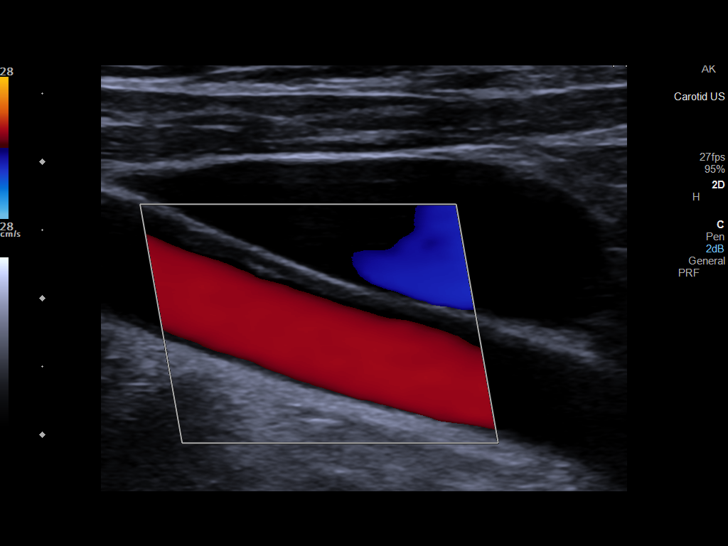
[im 48/69]
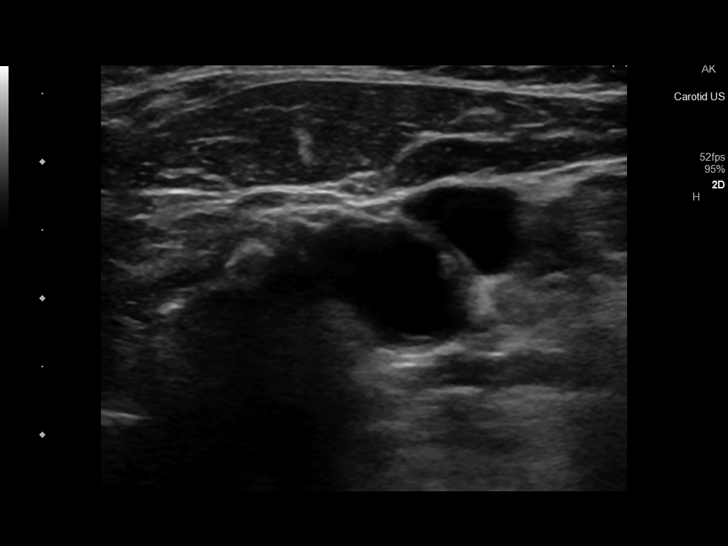
[im 54/69]
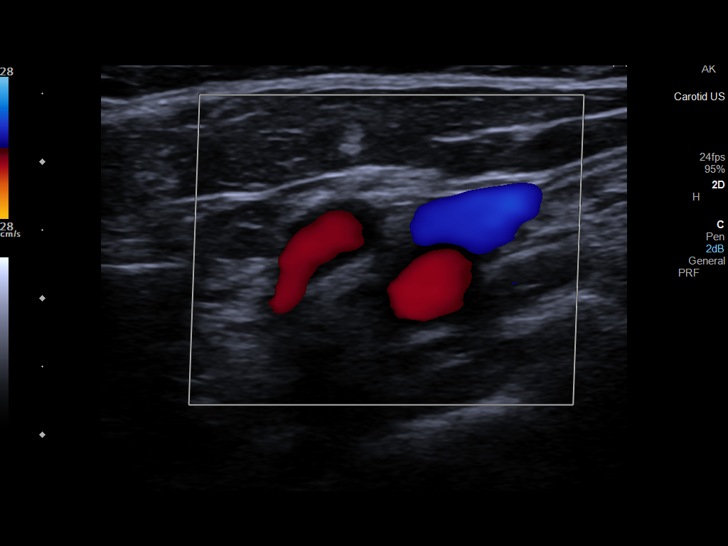
[im 57/69]
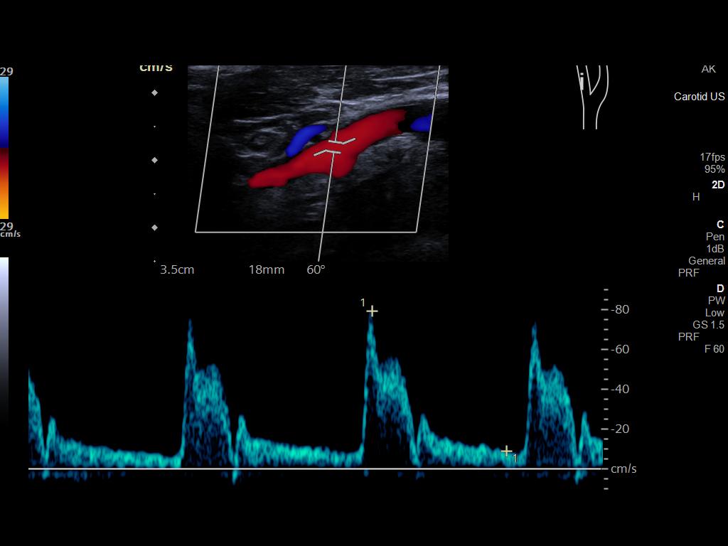
[im 63/69]
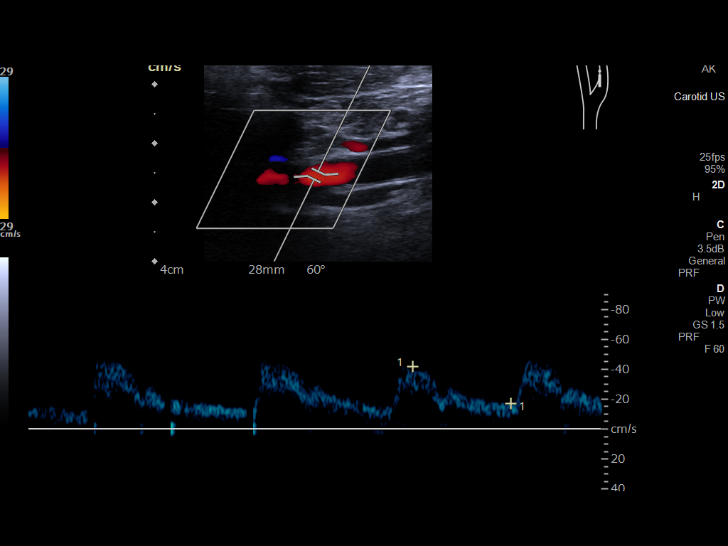
[im 69/69]
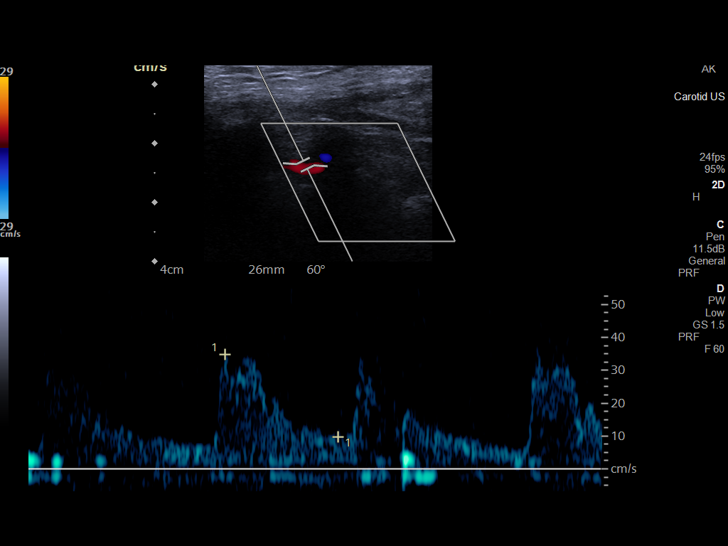

[14 of 24 positions shown; findings below may reference images not displayed]

FINDINGS: Criteria: Quantification of carotid stenosis is based on velocity
parameters that correlate the residual internal carotid diameter
with NASCET-based stenosis levels, using the diameter of the distal
internal carotid lumen as the denominator for stenosis measurement.

The following velocity measurements were obtained:

RIGHT

ICA: 57/17 cm/sec

CCA: 54/11 cm/sec

SYSTOLIC ICA/CCA RATIO:

ECA: 62 cm/sec

LEFT

ICA: 54/19 cm/sec

CCA: 46/10 cm/sec

SYSTOLIC ICA/CCA RATIO:

ECA: 79 cm/sec

RIGHT CAROTID ARTERY: No significant atheromatous plaque of the
carotid arteries.

RIGHT VERTEBRAL ARTERY:  Antegrade flow.

LEFT CAROTID ARTERY: No significant atheromatous plaque of the
carotid arteries.

LEFT VERTEBRAL ARTERY:  Antegrade flow.
IMPRESSION: No significant stenosis of the internal carotid arteries.

## 2023-04-02 DIAGNOSIS — I110 Hypertensive heart disease with heart failure: Secondary | ICD-10-CM | POA: Diagnosis not present

## 2023-04-02 DIAGNOSIS — E1142 Type 2 diabetes mellitus with diabetic polyneuropathy: Secondary | ICD-10-CM | POA: Diagnosis not present

## 2023-04-02 DIAGNOSIS — F3342 Major depressive disorder, recurrent, in full remission: Secondary | ICD-10-CM | POA: Diagnosis not present

## 2023-04-02 DIAGNOSIS — I272 Pulmonary hypertension, unspecified: Secondary | ICD-10-CM | POA: Diagnosis not present

## 2023-04-02 DIAGNOSIS — E1169 Type 2 diabetes mellitus with other specified complication: Secondary | ICD-10-CM | POA: Diagnosis not present

## 2023-04-02 DIAGNOSIS — G319 Degenerative disease of nervous system, unspecified: Secondary | ICD-10-CM | POA: Diagnosis not present

## 2023-04-02 DIAGNOSIS — E261 Secondary hyperaldosteronism: Secondary | ICD-10-CM | POA: Diagnosis not present

## 2023-04-02 DIAGNOSIS — D6869 Other thrombophilia: Secondary | ICD-10-CM | POA: Diagnosis not present

## 2023-04-02 DIAGNOSIS — G901 Familial dysautonomia [Riley-Day]: Secondary | ICD-10-CM | POA: Diagnosis not present

## 2023-04-02 DIAGNOSIS — I4891 Unspecified atrial fibrillation: Secondary | ICD-10-CM | POA: Diagnosis not present

## 2023-04-02 DIAGNOSIS — J42 Unspecified chronic bronchitis: Secondary | ICD-10-CM | POA: Diagnosis not present

## 2023-04-02 DIAGNOSIS — I509 Heart failure, unspecified: Secondary | ICD-10-CM | POA: Diagnosis not present

## 2023-04-06 DIAGNOSIS — M5442 Lumbago with sciatica, left side: Secondary | ICD-10-CM | POA: Diagnosis not present

## 2023-04-06 DIAGNOSIS — M5441 Lumbago with sciatica, right side: Secondary | ICD-10-CM | POA: Diagnosis not present

## 2023-04-06 DIAGNOSIS — G8929 Other chronic pain: Secondary | ICD-10-CM | POA: Diagnosis not present

## 2023-04-06 DIAGNOSIS — M5416 Radiculopathy, lumbar region: Secondary | ICD-10-CM | POA: Diagnosis not present

## 2023-04-17 DIAGNOSIS — M5442 Lumbago with sciatica, left side: Secondary | ICD-10-CM | POA: Diagnosis not present

## 2023-04-17 DIAGNOSIS — M5441 Lumbago with sciatica, right side: Secondary | ICD-10-CM | POA: Diagnosis not present

## 2023-04-17 DIAGNOSIS — G8929 Other chronic pain: Secondary | ICD-10-CM | POA: Diagnosis not present

## 2023-04-17 DIAGNOSIS — M5416 Radiculopathy, lumbar region: Secondary | ICD-10-CM | POA: Diagnosis not present

## 2023-04-17 DIAGNOSIS — M6281 Muscle weakness (generalized): Secondary | ICD-10-CM | POA: Diagnosis not present

## 2023-04-24 DIAGNOSIS — M5441 Lumbago with sciatica, right side: Secondary | ICD-10-CM | POA: Diagnosis not present

## 2023-04-24 DIAGNOSIS — M6281 Muscle weakness (generalized): Secondary | ICD-10-CM | POA: Diagnosis not present

## 2023-04-24 DIAGNOSIS — M5442 Lumbago with sciatica, left side: Secondary | ICD-10-CM | POA: Diagnosis not present

## 2023-04-24 DIAGNOSIS — M5416 Radiculopathy, lumbar region: Secondary | ICD-10-CM | POA: Diagnosis not present

## 2023-04-24 DIAGNOSIS — G8929 Other chronic pain: Secondary | ICD-10-CM | POA: Diagnosis not present

## 2023-04-25 ENCOUNTER — Encounter: Payer: Self-pay | Admitting: Podiatry

## 2023-04-25 ENCOUNTER — Ambulatory Visit (INDEPENDENT_AMBULATORY_CARE_PROVIDER_SITE_OTHER): Payer: PPO | Admitting: Podiatry

## 2023-04-25 VITALS — BP 138/75 | HR 65

## 2023-04-25 DIAGNOSIS — B351 Tinea unguium: Secondary | ICD-10-CM | POA: Diagnosis not present

## 2023-04-25 DIAGNOSIS — M79676 Pain in unspecified toe(s): Secondary | ICD-10-CM

## 2023-04-25 NOTE — Progress Notes (Signed)
Subjective:  Patient ID: Teresa [name], female    DOB: [birth year],  MRN: [id number]  Chief Complaint  Patient presents with   Diabetes    "Cut these toenails."    77 y.o. female presents with the above complaint. History confirmed with patient.  Nails are thickened elongated and causing discomfort in shoe gear.  Her blood sugar remains well-controlled.  She likely is going to be having a lumbar fusion at some point in the near future  Objective:  Physical Exam: warm, good capillary refill, no trophic changes or ulcerative lesions, normal DP and PT pulses, and varicose veins noted, thickened elongated yellow dystrophic discolored nails bilaterally x 10 with subungual debris, mild ingrowing nail without infection or paronychia on the right side.  No pain to palpation around the left ankle joint, well-healed surgical scars  Assessment:   1. Pain due to onychomycosis of toenail      Plan:  Patient was evaluated and treated and all questions answered.  Discussed the etiology and treatment options for the condition in detail with the patient.  Recommended debridement of the nails today. Sharp and mechanical debridement performed of all painful and mycotic nails today. Nails debrided in length and thickness using a nail nipper to level of comfort. Discussed treatment options including appropriate shoe gear. Follow up as needed for painful nails.  Plan for new radiographs of left ankle at next visit   Return in about 3 months (around 07/25/2023) for at risk diabetic foot care, ankle xrays.

## 2023-04-26 ENCOUNTER — Encounter: Payer: Self-pay | Admitting: Podiatry

## 2023-04-27 DIAGNOSIS — G8929 Other chronic pain: Secondary | ICD-10-CM | POA: Diagnosis not present

## 2023-04-27 DIAGNOSIS — M5416 Radiculopathy, lumbar region: Secondary | ICD-10-CM | POA: Diagnosis not present

## 2023-04-27 DIAGNOSIS — M6281 Muscle weakness (generalized): Secondary | ICD-10-CM | POA: Diagnosis not present

## 2023-04-27 DIAGNOSIS — M5441 Lumbago with sciatica, right side: Secondary | ICD-10-CM | POA: Diagnosis not present

## 2023-04-27 DIAGNOSIS — M5442 Lumbago with sciatica, left side: Secondary | ICD-10-CM | POA: Diagnosis not present

## 2023-05-04 ENCOUNTER — Other Ambulatory Visit: Payer: Self-pay | Admitting: Family Medicine

## 2023-05-04 ENCOUNTER — Inpatient Hospital Stay
Admission: RE | Admit: 2023-05-04 | Discharge: 2023-05-04 | Disposition: A | Discharge status: Home / Self Care | Payer: Self-pay | Source: Ambulatory Visit | Attending: Neurosurgery | Admitting: Neurosurgery

## 2023-05-04 DIAGNOSIS — Z049 Encounter for examination and observation for unspecified reason: Secondary | ICD-10-CM

## 2023-05-04 NOTE — Progress Notes (Unsigned)
Referring Physician:  Elijah Birk, MD [address] [place],  [place] [address]  Primary Physician:  Bosie Clos, MD  History of Present Illness: 05/04/2023 [name] is here today with a chief complaint of ***  back pain traveling into the bilateral legs   Duration: 14 months Location: sharp, burning  Quality: *** Severity: 10/10?  Precipitating: aggravated by standing, walking, bending, lifting, squatting, stairs, sitting  Modifying factors: made better by heat and ice  Weakness: none Timing: constant Bowel/Bladder Dysfunction: none  Conservative measures:  Physical therapy: Has had PT Dr. Mariah Milling  Multimodal medical therapy including regular antiinflammatories: Tylenol, Tramadol, Gabapentin Injections: 03/23/2023 bilateral L4-5 transforaminal epidural injection   Past Surgery: L5-S1 fusion  Teresa [name] has ***no symptoms of cervical myelopathy.  The symptoms are causing a significant impact on the patient's life.   I have utilized the care everywhere function in epic to review the outside records available from external health systems.  Review of Systems:  A 10 point review of systems is negative, except for the pertinent positives and negatives detailed in the HPI.  Past Medical History: Past Medical History:  Diagnosis Date   Anginal pain (HCC)    Aortic atherosclerosis (HCC)    Arthritis    Asthma    Atrial fibrillation (HCC)    a.) CHA2DS2-VASc = 7 (age x2, sex, CHF, HTN, vascular disease history, T2DM);  b.) rate/rhythm maintained without pharmacological intervention; chronically anticoagulated using standard dose apixaban   Bilateral lower extremity edema    CHF (congestive heart failure) (HCC)    Complication of anesthesia    a.) PONV   COPD (chronic obstructive pulmonary disease) (HCC)    Coronary artery disease    a.) cCTA 11/01/2021: Ca+ score 174 (67th percentile for age/sex match) --> FFR demonstrated no  hemodynamically significant stenosis   Depression    DOE (dyspnea on exertion)    GERD (gastroesophageal reflux disease)    Gout    Heart murmur    History of 2019 novel coronavirus disease (COVID-19) 05/12/2019   History of hiatal hernia    History of kidney stones    History of orthopnea    HLD (hyperlipidemia)    Hypertension    Hypothyroidism    Long term current use of anticoagulant    a.) apixaban   Migraines    Mobitz type I Wenckebach atrioventricular block    a.) holter study 01/27/2022   Palpitations    PONV (postoperative nausea and vomiting)    PSVT (paroxysmal supraventricular tachycardia) 01/27/2022   a.) holter study 01/27/2022 - longest lasting 17 beats at a rate of 109 bpm   Pulmonary HTN (HCC)    RBBB (right bundle branch block)    Scleroderma (HCC)    hands   Sleep apnea    a.) does not require nocturnal PAP therapy   T2DM (type 2 diabetes mellitus) (HCC)    Vertigo    Vitamin D deficiency     Past Surgical History: Past Surgical History:  Procedure Laterality Date   ABDOMINAL HYSTERECTOMY     ovaries intact   ANKLE ARTHROSCOPY Left 03/03/2022   Procedure: ANKLE ARTHROSCOPY;  Surgeon: Edwin Cap, DPM;  Location: [facility];  Service: Podiatry;  Laterality: Left;  GENERAL WITH POP AND SAPHENOUS BLOCK   BLADDER SURGERY     bladder tuck   BREAST BIOPSY Right 03/20/2016   neg x 2 area   CARDIAC CATHETERIZATION     CARPAL TUNNEL  RELEASE     CATARACT EXTRACTION W/PHACO Left 09/17/2018   Procedure: CATARACT EXTRACTION PHACO AND INTRAOCULAR LENS PLACEMENT (IOC) LEFT, DIABETIC;  Surgeon: Galen Manila, MD;  Location: [facility];  Service: Ophthalmology;  Laterality: Left;  [place] 00:34 CDE 4.85 Fluid pack lot # [id number]   CATARACT EXTRACTION W/PHACO Right 10/15/2018   Procedure: CATARACT EXTRACTION PHACO AND INTRAOCULAR LENS PLACEMENT (IOC)-RIGHT;  Surgeon: Galen Manila, MD;  Location: [facility];  Service: Ophthalmology;  Laterality: Right;  [place]  00:27.6 CDE 3.43 Fluid Pack Lot # [id number]   COLONOSCOPY WITH PROPOFOL N/A 09/27/2018   Procedure: COLONOSCOPY WITH PROPOFOL;  Surgeon: Pasty Spillers, MD;  Location: [facility];  Service: Endoscopy;  Laterality: N/A;   DILATION AND CURETTAGE OF UTERUS     EYE SURGERY     eyelid   FRACTURE SURGERY     left ankle-plate and screws palced   GRAFT APPLICATION Left 03/03/2022   Procedure: BONE GRAFT;  Surgeon: Edwin Cap, DPM;  Location: [facility];  Service: Podiatry;  Laterality: Left;   HARDWARE REMOVAL Left 03/03/2022   Procedure: POSSIBLE HARDWARE REMOVAL;  Surgeon: Edwin Cap, DPM;  Location: [facility];  Service: Podiatry;  Laterality: Left;   LITHOTRIPSY     NISSEN FUNDOPLICATION     SHOULDER SURGERY Right    rotator cuff   TONSILLECTOMY      Allergies: Allergies as of 05/08/2023 - Review Complete 04/25/2023  Allergen Reaction Noted   Clarithromycin Nausea And Vomiting and Other (See Comments)    Codeine  12/01/2011   Dilaudid  [hydromorphone hcl] Nausea And Vomiting 06/28/2015   Iodine Hives and Itching 06/28/2015   Iohexol Hives and Itching 03/05/2007   Onion Diarrhea and Nausea And Vomiting 10/26/2021   Tamiflu  [oseltamivir phosphate] Nausea And Vomiting 06/28/2015   Zolpidem Nausea And Vomiting and Other (See Comments) 07/26/2015   Bacitracin-neomycin-polymyxin Itching and Rash 06/28/2015   Benzalkonium chloride Itching, Rash, and Swelling 07/26/2015   Lidocaine hcl Itching, Rash, and Swelling 06/28/2015   Morphine Nausea And Vomiting and Rash 12/01/2011   Neomycin-bacitracin zn-polymyx Itching and Rash    Tape Itching and Rash 07/12/2016   Tapentadol Rash 07/12/2016    Medications:  Current Outpatient Medications:    acetaminophen (TYLENOL) 500 MG tablet, Take 1,000 mg by mouth every 6 (six) hours as needed for mild pain or headache., Disp: , Rfl:    albuterol (VENTOLIN HFA) 108 (90 Base) MCG/ACT inhaler, Inhale 2 puffs into the lungs every 6  (six) hours as needed for shortness of breath., Disp: , Rfl:    allopurinol (ZYLOPRIM) 100 MG tablet, Take 1 tablet (100 mg total) by mouth daily., Disp: 90 tablet, Rfl: 0   apixaban (ELIQUIS) 5 MG TABS tablet, Take 5 mg by mouth 2 (two) times daily., Disp: , Rfl:    atorvastatin (LIPITOR) 10 MG tablet, TAKE ONE TABLET BY MOUTH EVERYDAY AT BEDTIME, Disp: 90 tablet, Rfl: 2   colchicine 0.6 MG tablet, Take 0.6 mg by mouth daily as needed (gout)., Disp: , Rfl:    Continuous Blood Gluc Sensor (FREESTYLE LIBRE 2 SENSOR) MISC, 1 each by Does not apply route every 14 (fourteen) days., Disp: 1 each, Rfl: 3   donepezil (ARICEPT) 5 MG tablet, Take 1 tablet (5 mg total) by mouth at bedtime., Disp: 90 tablet, Rfl: 3   empagliflozin (JARDIANCE) 25 MG TABS tablet, Take 1 tablet (25 mg total) by mouth daily., Disp: 90 tablet, Rfl: 0   furosemide (LASIX) 40 MG tablet, Take  1 tablet (40 mg total) by mouth daily., Disp: 90 tablet, Rfl: 0   gabapentin (NEURONTIN) 800 MG tablet, Take 1 tablet (800 mg total) by mouth 3 (three) times daily., Disp: 270 tablet, Rfl: 0   levothyroxine (SYNTHROID) 88 MCG tablet, TAKE 1 TABLET BY MOUTH EVERY DAY, Disp: 90 tablet, Rfl: 3   metFORMIN (GLUCOPHAGE) 1000 MG tablet, Take 1 tablet (1,000 mg total) by mouth 2 (two) times daily., Disp: 180 tablet, Rfl: 0   nystatin (MYCOSTATIN/NYSTOP) powder, Apply 1 Application topically 3 (three) times daily., Disp: 60 g, Rfl: 0   Omega-3 Fatty Acids (FISH OIL PO), Take 1 capsule by mouth daily., Disp: , Rfl:    ondansetron (ZOFRAN) 4 MG tablet, TAKE 1 TABLET BY MOUTH EVERY 8 HOURS AS NEEDED FOR NAUSEA AND VOMITING, Disp: 45 tablet, Rfl: 0   pioglitazone (ACTOS) 15 MG tablet, Take 1 tablet (15 mg total) by mouth every morning., Disp: 90 tablet, Rfl: 0   promethazine (PHENERGAN) 12.5 MG tablet, Take 1 tablet (12.5 mg total) by mouth every 6 (six) hours as needed for nausea or vomiting., Disp: 30 tablet, Rfl: 0   Semaglutide, 1 MG/DOSE, 4 MG/3ML SOPN,  Inject 1 mg as directed once a week., Disp: 9 mL, Rfl: 0   sertraline (ZOLOFT) 50 MG tablet, Take 1 tablet (50 mg total) by mouth daily., Disp: 90 tablet, Rfl: 1  Social History: Social History   Tobacco Use   Smoking status: Never   Smokeless tobacco: Never  Vaping Use   Vaping status: Never Used  Substance Use Topics   Alcohol use: Never   Drug use: Never    Family Medical History: Family History  Problem Relation Age of Onset   Stroke Mother    Hypertension Mother    Heart disease Mother    Arthritis Mother    Heart disease Father    Hypertension Father    Diabetes Sister    Hypertension Sister    Asthma Sister    Hypertension Sister    Diverticulitis Sister    Colon cancer Maternal Grandmother    Breast cancer Maternal Grandmother    Breast cancer Maternal Aunt     Physical Examination: There were no vitals filed for this visit.  General: Patient is in no apparent distress. Attention to examination is appropriate.  Neck:   Supple.  Full range of motion.  Respiratory: Patient is breathing without any difficulty.   NEUROLOGICAL:     Awake, alert, oriented to person, place, and time.  Speech is clear and fluent.   Cranial Nerves: Pupils equal round and reactive to light.  Facial tone is symmetric.  Facial sensation is symmetric. Shoulder shrug is symmetric. Tongue protrusion is midline.  There is no pronator drift.  Strength: Side Biceps Triceps Deltoid Interossei Grip Wrist Ext. Wrist Flex.  R 5 5 5 5 5 5 5   L 5 5 5 5 5 5 5    Side Iliopsoas Quads Hamstring PF DF EHL  R 5 5 5 5 5 5   L 5 5 5 5 5 5    Reflexes are ***2+ and symmetric at the biceps, triceps, brachioradialis, patella and achilles.   Hoffman's is absent.   Bilateral upper and lower extremity sensation is intact to light touch.    No evidence of dysmetria noted.  Gait is normal.     Medical Decision Making  Imaging: ***  I have personally reviewed the images and agree with the above  interpretation.  Assessment and Plan: Ms.  Crotwell is a pleasant 77 y.o. female with ***    Thank you for involving me in the care of this patient.      Phoenix Dresser K. Myer Haff MD, [facility]

## 2023-05-07 ENCOUNTER — Other Ambulatory Visit: Payer: Self-pay

## 2023-05-07 DIAGNOSIS — M5410 Radiculopathy, site unspecified: Secondary | ICD-10-CM

## 2023-05-07 DIAGNOSIS — E119 Type 2 diabetes mellitus without complications: Secondary | ICD-10-CM | POA: Diagnosis not present

## 2023-05-07 DIAGNOSIS — I5032 Chronic diastolic (congestive) heart failure: Secondary | ICD-10-CM | POA: Diagnosis not present

## 2023-05-07 DIAGNOSIS — I10 Essential (primary) hypertension: Secondary | ICD-10-CM | POA: Diagnosis not present

## 2023-05-07 DIAGNOSIS — R569 Unspecified convulsions: Secondary | ICD-10-CM | POA: Diagnosis not present

## 2023-05-07 DIAGNOSIS — J449 Chronic obstructive pulmonary disease, unspecified: Secondary | ICD-10-CM | POA: Diagnosis not present

## 2023-05-07 DIAGNOSIS — E669 Obesity, unspecified: Secondary | ICD-10-CM | POA: Diagnosis not present

## 2023-05-07 DIAGNOSIS — R0902 Hypoxemia: Secondary | ICD-10-CM | POA: Diagnosis not present

## 2023-05-07 DIAGNOSIS — I480 Paroxysmal atrial fibrillation: Secondary | ICD-10-CM | POA: Diagnosis not present

## 2023-05-07 DIAGNOSIS — R0602 Shortness of breath: Secondary | ICD-10-CM | POA: Diagnosis not present

## 2023-05-07 DIAGNOSIS — G4733 Obstructive sleep apnea (adult) (pediatric): Secondary | ICD-10-CM | POA: Diagnosis not present

## 2023-05-07 DIAGNOSIS — E782 Mixed hyperlipidemia: Secondary | ICD-10-CM | POA: Diagnosis not present

## 2023-05-07 DIAGNOSIS — R079 Chest pain, unspecified: Secondary | ICD-10-CM | POA: Diagnosis not present

## 2023-05-07 NOTE — Progress Notes (Signed)
lumbar 

## 2023-05-08 ENCOUNTER — Encounter: Payer: Self-pay | Admitting: Neurosurgery

## 2023-05-08 ENCOUNTER — Ambulatory Visit (INDEPENDENT_AMBULATORY_CARE_PROVIDER_SITE_OTHER): Payer: PPO | Admitting: Neurosurgery

## 2023-05-08 VITALS — BP 130/78 | Ht 62.0 in | Wt 180.8 lb

## 2023-05-08 DIAGNOSIS — Z7985 Long-term (current) use of injectable non-insulin antidiabetic drugs: Secondary | ICD-10-CM | POA: Diagnosis not present

## 2023-05-08 DIAGNOSIS — G8929 Other chronic pain: Secondary | ICD-10-CM

## 2023-05-08 DIAGNOSIS — E1140 Type 2 diabetes mellitus with diabetic neuropathy, unspecified: Secondary | ICD-10-CM

## 2023-05-08 DIAGNOSIS — M5442 Lumbago with sciatica, left side: Secondary | ICD-10-CM | POA: Diagnosis not present

## 2023-05-08 DIAGNOSIS — M5441 Lumbago with sciatica, right side: Secondary | ICD-10-CM

## 2023-05-08 DIAGNOSIS — M51362 Other intervertebral disc degeneration, lumbar region with discogenic back pain and lower extremity pain: Secondary | ICD-10-CM | POA: Diagnosis not present

## 2023-05-08 DIAGNOSIS — Z981 Arthrodesis status: Secondary | ICD-10-CM

## 2023-05-08 DIAGNOSIS — M48061 Spinal stenosis, lumbar region without neurogenic claudication: Secondary | ICD-10-CM | POA: Diagnosis not present

## 2023-05-08 NOTE — Patient Instructions (Signed)
Please see below for information in regards to your upcoming surgery:   Planned surgery: L4-5 lateral lumbar interbody fusion and posterior spinal fusion   Surgery date: 06/18/23 at [facility] (Medical Mall: [address], [place], [place] [address]) - you will find out your arrival time the business day before your surgery.   Pre-op appointment at [facility] Pre-admit Testing: we will call you with a date/time for this. If you are scheduled for an in person appointment, Pre-admit Testing is located on the first floor of the [facility] building, [address], [address]. Please bring all prescriptions in the original prescription bottles to your appointment. During this appointment, they will advise you which medications you can take the morning of surgery, and which medications you will need to hold for surgery. Labs (such as blood work, EKG) may be done at your pre-op appointment. You are not required to fast for these labs. Should you need to change your pre-op appointment, please call Pre-admit testing at [phone].     Blood thinners:       Eliquis:   stop Eliquis 3 days prior, resume Eliquis 14 days after     Diabetes/weight loss medications:  Semaglutide (Ozempic) injections: hold 7 days prior to surgery Empagliflozin (Jardiance): hold 3 days prior to surgery Metformin: hold for 2 days prior to surgery    Surgical clearance: we will send a clearance form to Dr Juliann Pares and Dr Sullivan Lone     NSAIDS (Non-steroidal anti-inflammatory drugs): because you are having a fusion, please avoid taking any NSAIDS (examples: ibuprofen, motrin, aleve, naproxen, meloxicam, diclofenac) for 3 months after surgery. Celebrex is an exception and is OK to take, if prescribed. Tylenol is not an NSAID.    Common restrictions after surgery: No bending, lifting, or twisting ("BLT"). Avoid lifting objects heavier than 10 pounds for the first 6 weeks after  surgery. Where possible, avoid household activities that involve lifting, bending, reaching, pushing, or pulling such as laundry, vacuuming, grocery shopping, and childcare. Try to arrange for help from friends and family for these activities while you heal. Do not drive while taking prescription pain medication. Weeks 6 through 12 after surgery: avoid lifting more than 25 pounds.    X-rays after surgery: Because you are having a fusion: for appointments after your 2 week follow-up: please arrive at the [facility] outpatient imaging center ([address], [place]) or [facility] one hour prior to your appointment for x-rays. This applies to every appointment after your 2 week follow-up. Failure to do so may result in your appointment being rescheduled.   How to contact us:  If you have any questions/concerns before or after surgery, you can reach [place] at [phone], or you can send a mychart message. We can be reached by phone or mychart 8am-4pm, Monday-Friday.  *Please note: Calls after 4pm are forwarded to a third party answering service. Mychart messages are not routinely monitored during evenings, weekends, and holidays. Please call our office to contact the answering service for urgent concerns during non-business hours.     Appointments/FMLA & disability paperwork: Joycelyn Rua, & Flonnie Hailstone Nurse: Royston Cowper  Medical assistants: Nash Mantis Physician Assistants: Manning Charity & Drake Leach Surgeons: Venetia Night, MD & Ernestine Mcmurray, MD

## 2023-05-08 NOTE — Addendum Note (Signed)
Addended by: Sharlot Gowda on: 05/08/2023 10:03 AM   Modules accepted: Orders

## 2023-05-23 DIAGNOSIS — J4540 Moderate persistent asthma, uncomplicated: Secondary | ICD-10-CM | POA: Diagnosis not present

## 2023-05-24 ENCOUNTER — Other Ambulatory Visit: Payer: Self-pay | Admitting: Family Medicine

## 2023-05-24 ENCOUNTER — Encounter: Payer: Self-pay | Admitting: Neurosurgery

## 2023-05-25 ENCOUNTER — Other Ambulatory Visit: Payer: Self-pay

## 2023-05-25 DIAGNOSIS — Z01818 Encounter for other preprocedural examination: Secondary | ICD-10-CM

## 2023-05-25 NOTE — Addendum Note (Signed)
Addended by: Sharlot Gowda on: 05/25/2023 02:07 PM   Modules accepted: Orders

## 2023-05-28 ENCOUNTER — Encounter: Payer: Self-pay | Admitting: Neurosurgery

## 2023-05-31 DIAGNOSIS — I272 Pulmonary hypertension, unspecified: Secondary | ICD-10-CM | POA: Diagnosis not present

## 2023-05-31 DIAGNOSIS — Z0000 Encounter for general adult medical examination without abnormal findings: Secondary | ICD-10-CM | POA: Diagnosis not present

## 2023-05-31 DIAGNOSIS — F3341 Major depressive disorder, recurrent, in partial remission: Secondary | ICD-10-CM | POA: Diagnosis not present

## 2023-05-31 DIAGNOSIS — M5417 Radiculopathy, lumbosacral region: Secondary | ICD-10-CM | POA: Diagnosis not present

## 2023-05-31 DIAGNOSIS — E7800 Pure hypercholesterolemia, unspecified: Secondary | ICD-10-CM | POA: Diagnosis not present

## 2023-05-31 DIAGNOSIS — J849 Interstitial pulmonary disease, unspecified: Secondary | ICD-10-CM | POA: Diagnosis not present

## 2023-05-31 DIAGNOSIS — R2689 Other abnormalities of gait and mobility: Secondary | ICD-10-CM | POA: Diagnosis not present

## 2023-05-31 DIAGNOSIS — E118 Type 2 diabetes mellitus with unspecified complications: Secondary | ICD-10-CM | POA: Diagnosis not present

## 2023-05-31 DIAGNOSIS — I10 Essential (primary) hypertension: Secondary | ICD-10-CM | POA: Diagnosis not present

## 2023-05-31 DIAGNOSIS — Z1331 Encounter for screening for depression: Secondary | ICD-10-CM | POA: Diagnosis not present

## 2023-05-31 DIAGNOSIS — E079 Disorder of thyroid, unspecified: Secondary | ICD-10-CM | POA: Diagnosis not present

## 2023-05-31 DIAGNOSIS — I4811 Longstanding persistent atrial fibrillation: Secondary | ICD-10-CM | POA: Diagnosis not present

## 2023-05-31 DIAGNOSIS — G4733 Obstructive sleep apnea (adult) (pediatric): Secondary | ICD-10-CM | POA: Diagnosis not present

## 2023-06-06 ENCOUNTER — Other Ambulatory Visit: Payer: Self-pay

## 2023-06-06 ENCOUNTER — Encounter
Admission: RE | Admit: 2023-06-06 | Discharge: 2023-06-06 | Disposition: A | Discharge status: Home / Self Care | Payer: PPO | Source: Ambulatory Visit | Attending: Neurosurgery | Admitting: Neurosurgery

## 2023-06-06 VITALS — BP 128/69 | HR 65 | Resp 18 | Wt 186.1 lb

## 2023-06-06 DIAGNOSIS — Z01812 Encounter for preprocedural laboratory examination: Secondary | ICD-10-CM

## 2023-06-06 DIAGNOSIS — Z01810 Encounter for preprocedural cardiovascular examination: Secondary | ICD-10-CM | POA: Diagnosis not present

## 2023-06-06 DIAGNOSIS — I119 Hypertensive heart disease without heart failure: Secondary | ICD-10-CM

## 2023-06-06 DIAGNOSIS — I10 Essential (primary) hypertension: Secondary | ICD-10-CM

## 2023-06-06 DIAGNOSIS — Z01818 Encounter for other preprocedural examination: Secondary | ICD-10-CM | POA: Insufficient documentation

## 2023-06-06 DIAGNOSIS — I5032 Chronic diastolic (congestive) heart failure: Secondary | ICD-10-CM | POA: Insufficient documentation

## 2023-06-06 DIAGNOSIS — E1140 Type 2 diabetes mellitus with diabetic neuropathy, unspecified: Secondary | ICD-10-CM | POA: Insufficient documentation

## 2023-06-06 DIAGNOSIS — I110 Hypertensive heart disease with heart failure: Secondary | ICD-10-CM | POA: Insufficient documentation

## 2023-06-06 DIAGNOSIS — E876 Hypokalemia: Secondary | ICD-10-CM

## 2023-06-06 DIAGNOSIS — Z79899 Other long term (current) drug therapy: Secondary | ICD-10-CM

## 2023-06-06 HISTORY — DX: Cerebral infarction, unspecified: I63.9

## 2023-06-06 HISTORY — DX: Long term (current) use of anticoagulants: Z79.01

## 2023-06-06 HISTORY — DX: Pneumonia, unspecified organism: J18.9

## 2023-06-06 HISTORY — DX: Procedure and treatment not carried out because of patient's decision for reasons of belief and group pressure: Z53.1

## 2023-06-06 LAB — URINALYSIS, COMPLETE (UACMP) WITH MICROSCOPIC
Bacteria, UA: NONE SEEN
Bilirubin Urine: NEGATIVE
Glucose, UA: >=500 mg/dL — AB
Ketones, ur: 5 mg/dL — AB
Leukocytes,Ua: NEGATIVE
Nitrite: NEGATIVE
Protein, ur: NEGATIVE mg/dL
Specific Gravity, Urine: 1.039 — ABNORMAL HIGH (ref 1.005–1.030)
pH: 5 (ref 5.0–8.0)

## 2023-06-06 LAB — SURGICAL PCR SCREEN
MRSA, PCR: NEGATIVE
Staphylococcus aureus: NEGATIVE

## 2023-06-06 LAB — POTASSIUM: Potassium: 3.1 mmol/L — ABNORMAL LOW (ref 3.5–5.1)

## 2023-06-06 MED ORDER — POTASSIUM CHLORIDE ER 10 MEQ PO TBCR: 10.0000 meq | EXTENDED_RELEASE_TABLET | Freq: Every day | ORAL | 0 refills | Status: DC

## 2023-06-06 NOTE — Patient Instructions (Addendum)
Your procedure is scheduled on: 06/18/23 - Monday Report to the Registration Desk on the 1st floor of the Medical Mall. To find out your arrival time, please call [phone] between 1PM - 3PM on: 06/15/23 - Friday If your arrival time is 6:00 am, do not arrive before that time as the Medical Mall entrance doors do not open until 6:00 am.  REMEMBER: Instructions that are not followed completely may result in serious medical risk, up to and including death; or upon the discretion of your surgeon and anesthesiologist your surgery may need to be rescheduled.  Do not eat food after midnight the night before surgery.  No gum chewing or hard candies.  You may however, drink CLEAR liquids up to 2 hours before you are scheduled to arrive for your surgery. Do not drink anything within 2 hours of your scheduled arrival time.  Clear liquids include: - water    May continue Anti-inflammatories (NSAIDS) such as Advil, Aleve, Ibuprofen, Motrin, Naproxen, Naprosyn and Aspirin based products such as Excedrin, Goody's Powder, BC Powder.  Stop ANY OVER THE COUNTER supplements until after surgery.  You may take Tylenol if needed for pain up until the day of surgery.  You have been instructed to hold their blood thinner(s) and/or diabetes/weight loss medication(s) for surgery as listed below:  Blood thinners: Eliquis:   stop Eliquis 3 days prior beginning 11/08, resume Eliquis 14 days after   Diabetes/weight loss medications:  Semaglutide (Ozempic) injections: hold 7 days prior to surgery Empagliflozin (Jardiance): hold 3 days prior to surgery beginning 11/08. Metformin: hold for 2 days prior to surgery beginning 11/09.   ON THE DAY OF SURGERY ONLY TAKE THESE MEDICATIONS WITH SIPS OF WATER:  gabapentin (NEURONTIN)  levothyroxine (SYNTHROID)   Use inhaler albuterol (VENTOLIN HFA)  on the day of surgery and bring to the hospital.  No Alcohol for 24 hours before or after surgery.  No Smoking  including e-cigarettes for 24 hours before surgery.  No chewable tobacco products for at least 6 hours before surgery.  No nicotine patches on the day of surgery.  Do not use any "recreational" drugs for at least a week (preferably 2 weeks) before your surgery.  Please be advised that the combination of cocaine and anesthesia may have negative outcomes, up to and including death. If you test positive for cocaine, your surgery will be cancelled.  On the morning of surgery brush your teeth with toothpaste and water, you may rinse your mouth with mouthwash if you wish. Do not swallow any toothpaste or mouthwash.  Use CHG Soap or wipes as directed on instruction sheet.  Do not wear jewelry, make-up, hairpins, clips or nail polish.  For welded (permanent) jewelry: bracelets, anklets, waist bands, etc.  Please have this removed prior to surgery.  If it is not removed, there is a chance that hospital personnel will need to cut it off on the day of surgery.  Do not wear lotions, powders, or perfumes.   Do not shave body hair from the neck down 48 hours before surgery.  Contact lenses, hearing aids and dentures may not be worn into surgery.  Do not bring valuables to the hospital. [facility] is not responsible for any missing/lost belongings or valuables.    Notify your doctor if there is any change in your medical condition (cold, fever, infection).  Wear comfortable clothing (specific to your surgery type) to the hospital.  After surgery, you can help prevent lung complications by doing breathing exercises.  Take deep breaths and cough every 1-2 hours. Your doctor may order a device called an Incentive Spirometer to help you take deep breaths. When coughing or sneezing, hold a pillow firmly against your incision with both hands. This is called "splinting." Doing this helps protect your incision. It also decreases belly discomfort.  If you are being admitted to the hospital overnight, leave  your suitcase in the car. After surgery it may be brought to your room.  In case of increased patient census, it may be necessary for you, the patient, to continue your postoperative care in the Same Day Surgery department.  If you are being discharged the day of surgery, you will not be allowed to drive home. You will need a responsible individual to drive you home and stay with you for 24 hours after surgery.   If you are taking public transportation, you will need to have a responsible individual with you.  Please call the Pre-admissions Testing Dept. at [phone] if you have any questions about these instructions.  Surgery Visitation Policy:  Patients having surgery or a procedure may have two visitors.  Children under the age of 47 must have an adult with them who is not the patient.  Inpatient Visitation:    Visiting hours are 7 a.m. to 8 p.m. Up to four visitors are allowed at one time in a patient room. The visitors may rotate out with other people during the day.  One visitor age 21 or older may stay with the patient overnight and must be in the room by 8 p.m.    Pre-operative 5 CHG Bath Instructions   You can play a key role in reducing the risk of infection after surgery. Your skin needs to be as free of germs as possible. You can reduce the number of germs on your skin by washing with CHG (chlorhexidine gluconate) soap before surgery. CHG is an antiseptic soap that kills germs and continues to kill germs even after washing.   DO NOT use if you have an allergy to chlorhexidine/CHG or antibacterial soaps. If your skin becomes reddened or irritated, stop using the CHG and notify one of our RNs at [phone].   Please shower with the CHG soap starting 4 days before surgery using the following schedule: 11/07 - 11/11.    Please keep in mind the following:  DO NOT shave, including legs and underarms, starting the day of your first shower.   You may shave your face at  any point before/day of surgery.  Place clean sheets on your bed the day you start using CHG soap. Use a clean washcloth (not used since being washed) for each shower. DO NOT sleep with pets once you start using the CHG.   CHG Shower Instructions:  If you choose to wash your hair and private area, wash first with your normal shampoo/soap.  After you use shampoo/soap, rinse your hair and body thoroughly to remove shampoo/soap residue.  Turn the water OFF and apply about 3 tablespoons (45 ml) of CHG soap to a CLEAN washcloth.  Apply CHG soap ONLY FROM YOUR NECK DOWN TO YOUR TOES (washing for 3-5 minutes)  DO NOT use CHG soap on face, private areas, open wounds, or sores.  Pay special attention to the area where your surgery is being performed.  If you are having back surgery, having someone wash your back for you may be helpful. Wait 2 minutes after CHG soap is applied, then you may rinse off the  CHG soap.  Pat dry with a clean towel  Put on clean clothes/pajamas   If you choose to wear lotion, please use ONLY the CHG-compatible lotions on the back of this paper.     Additional instructions for the day of surgery: DO NOT APPLY any lotions, deodorants, cologne, or perfumes.   Put on clean/comfortable clothes.  Brush your teeth.  Ask your nurse before applying any prescription medications to the skin.      CHG Compatible Lotions   Aveeno Moisturizing lotion  Cetaphil Moisturizing Cream  Cetaphil Moisturizing Lotion  Clairol Herbal Essence Moisturizing Lotion, Dry Skin  Clairol Herbal Essence Moisturizing Lotion, Extra Dry Skin  Clairol Herbal Essence Moisturizing Lotion, Normal Skin  Curel Age Defying Therapeutic Moisturizing Lotion with Alpha Hydroxy  Curel Extreme Care Body Lotion  Curel Soothing Hands Moisturizing Hand Lotion  Curel Therapeutic Moisturizing Cream, Fragrance-Free  Curel Therapeutic Moisturizing Lotion, Fragrance-Free  Curel Therapeutic Moisturizing Lotion,  Original Formula  Eucerin Daily Replenishing Lotion  Eucerin Dry Skin Therapy Plus Alpha Hydroxy Crme  Eucerin Dry Skin Therapy Plus Alpha Hydroxy Lotion  Eucerin Original Crme  Eucerin Original Lotion  Eucerin Plus Crme Eucerin Plus Lotion  Eucerin TriLipid Replenishing Lotion  Keri Anti-Bacterial Hand Lotion  Keri Deep Conditioning Original Lotion Dry Skin Formula Softly Scented  Keri Deep Conditioning Original Lotion, Fragrance Free Sensitive Skin Formula  Keri Lotion Fast Absorbing Fragrance Free Sensitive Skin Formula  Keri Lotion Fast Absorbing Softly Scented Dry Skin Formula  Keri Original Lotion  Keri Skin Renewal Lotion Keri Silky Smooth Lotion  Keri Silky Smooth Sensitive Skin Lotion  Nivea Body Creamy Conditioning Oil  Nivea Body Extra Enriched Teacher, adult education Moisturizing Lotion Nivea Crme  Nivea Skin Firming Lotion  NutraDerm 30 Skin Lotion  NutraDerm Skin Lotion  NutraDerm Therapeutic Skin Cream  NutraDerm Therapeutic Skin Lotion  ProShield Protective Hand Cream  Provon moisturizing lotion

## 2023-06-06 NOTE — Progress Notes (Signed)
[facility] Perioperative Services: Pre-Admission/Anesthesia Testing  Abnormal Lab Notification and Treatment Plan of Care   Date: 06/06/23  Name: Teresa [name] MRN:   [id number]  Re: Abnormal labs noted during PAT appointment   Notified:  Provider Name Provider Role Notification Mode  Venetia Night, MD Neurosurgery (Surgeon) Routed and/or faxed via Dondra Prader, MD Primary Care Provider Routed and/or faxed via [facility]   Clinical Information and Notes:  ABNORMAL LAB VALUE(S): Lab Results  Component Value Date   K 3.1 (L) 06/06/2023   Teresa [name] is scheduled for an elective L4-5 LATERAL LUMBAR INTERBODY FUSION AND POSTERIOR SPINAL FUSION on 06/18/2023. In review of her medication reconciliation, it is noted that the patient is taking prescribed diuretic medications (furosemide 40 mg) daily.   Please note, in efforts to promote a safe and effective anesthetic course, per current guidelines/standards set by the [facility] anesthesia team, the minimal acceptable K+ level for the patient to proceed with general anesthesia is 3.0 mmol/L. With that being said, if the patient drops any lower, her elective procedure will need to be postponed until K+ is better optimized. In efforts to prevent case cancellation, will make efforts to optimize pre-surgical K+ level so that patient can safely undergo the planned surgical intervention.   Impression and Plan:  Teresa [name] found to be HYPOkalemic at 3.1 mmol/L on preoperative labs.   Contacted patient to discuss results and plans for correction of noted electrolyte derangement. She is on loop diuretic therapy daily. Patient does not take a daily K+ supplement. Discussed diuretic therapy as likely etiology in the absence of GI related symptoms (no diarrhea). Patient denies regular use of laxative medications. Reviewed other potential causes, including decreased intake of dietary K+. Reviewed plans for preoperative  optimization as follows:   Meds ordered this encounter  Medications   potassium chloride (KLOR-CON) 10 MEQ tablet    Sig: Take 1 tablet (10 mEq total) by mouth daily for 12 days.    Dispense:  12 tablet    Refill:  0   Encouraged patient to follow up with PCP about 2-3 weeks postoperatively to have labs rechecked to ensure that levels are remaining within normal range. Discussed nutritional intake of K+ rich foods as an adjunctive way to keep her K+ levels normal; list of K+ rich foods provided. Also mentioned ORS, however advised her not to rely solely on these drinks, as they are high in Na+, and she has a HTN diagnosis.   Will send copy of this note to surgeon and PCP to make them aware of K+ level and plans for correction. Discussed that PCP may elect to pursue a change in diuretic therapy to a K+ sparing type medication, or alternatively, they may consider adding a daily K+ supplement if levels remain low on recheck. Order entered to recheck K+ on the day of her surgery to ensure optimization. Wished patient the best of luck with her upcoming surgery and subsequent recovery. She was encouraged to return call to the PAT clinic, or to her surgeon's office, should any questions or concerns arise between now and the time of her surgery.   Encounter Diagnoses  Name Primary?   Pre-operative laboratory examination Yes   Diuretic-induced hypokalemia    Long term current use of diuretic    Quentin Mulling, MSN, APRN, FNP-C, CEN [facility]  Perioperative Services Nurse Practitioner Phone: [phone] 06/06/23 3:36 PM  NOTE: This note has been prepared  using Scientist, clinical (histocompatibility and immunogenetics). Despite my best ability to proofread, there is always the potential that unintentional transcriptional errors may still occur from this process.

## 2023-06-14 ENCOUNTER — Encounter: Payer: Self-pay | Admitting: Neurosurgery

## 2023-06-14 NOTE — Progress Notes (Signed)
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 Clinical nursing staff has updated patient's PMH/PSHx, current medication list, and drug allergies/intolerances to ensure comprehensive history available to assist in medical decision making as it pertains to the aforementioned surgical procedure and anticipated anesthetic course. Extensive review of available clinical information personally performed. [place] PMH and PSHx updated with any diagnoses/procedures that  may have been inadvertently omitted during her intake with the pre-admission testing department's nursing staff.  Clinical Discussion:  Teresa [name] is a 77 y.o. female who is submitted for pre-surgical anesthesia review and clearance prior to her undergoing the above procedure. Pertinent PMH includes: CAD, atrial fibrillation, HFpEF, PSVT, Mobitz type I AV block, palpitations, RBBB, aortic atherosclerosis, cerebral microvascular ischemic disease, mild CVA,  pulmonary hypertension, angina, HTN, HLD, T2DM, hypothyroidism, COPD, asthma, DOE, OSAH (does not require nocturnal PAP therapy), GERD (no daily Tx), hiatal hernia (s/p Nissen fundoplication), peripheral  edema, scleroderma, OA, chronic lower back pain, depression, anxiety, memory loss.   Patient is followed by cardiology Juliann Pares, MD). She was last seen in the cardiology clinic on 05/07/2023; notes reviewed. At the time of her clinic visit, patient reporting intermittent episodes of chest pain, shortness of breath, and palpitations.  Patient followed jointly by pulmonary medicine Karna Christmas, MD) for post SARS-CoV-2 ILD, which notes indicated has improved overall. Patient denied any PND, orthopnea, significant peripheral edema, weakness, fatigue, vertiginous symptoms, or presyncope/syncope. Patient with a past medical history significant for cardiovascular diagnoses. Documented physical exam was grossly benign, providing no evidence of acute exacerbation and/or decompensation of the patient's known cardiovascular conditions.  Patient reportedly presented to the ED with complaints of transient aphasia, memory loss, and extremity numbness.  CT and MRI imaging of the brain revealed chronic cerebral microvascular ischemic disease, however no acute intracranial process.  Etiology of event was felt to be secondary to her atrial fibrillation.  Patient advising that event was diagnosed as a mild CVA.  Aside from the memory loss, patient has no residual deficits following neurological event.  Last TTE was performed on 06/14/2021 revealing a normal left ventricular systolic function with an EF of 60 to 65%.  There were no regional wall motion abnormalities.  There was trivial mitral valve regurgitation.  There was no evidence of a significant transvalvular gradient to suggest stenosis.   Coronary CTA was performed on 11/01/2021 revealing a coronary calcium score of 174, which was the 67 percentile for age, sex, and race matched controls.  Study revealed mild stenosis in the ostial LAD, mid LCx, proximal OM1, and ostial RCA.  Moderate soft plaque stenosis noted in the mid LAD causing up to 50-69% stenosis.  FFR  analysis performed demonstrating  no hemodynamically significant flow-limiting lesions.   Left main: 0.99 LAD: proximal = 0.98, mid 0.92, distal 0.84 LCx: proximal = 0.99, mid = 0.99, distal = 0.96 RCA: proximal = 0.95 mid = 0.92, distal = 0.90   Long-term cardiac event monitor study performed on 01/27/2022 revealing a predominant underlying sinus rhythm with an average rate of 66 bpm; range 32-121 bpm.  There were 6 nonsustained SVT episodes with the longest lasting 17 beats an average rate of 109 bpm. Wenkebach present. Occasional supraventricular ectopy (2.9%) and rare ventricular ectopy noted.  There were no sustained arrhythmias.  No evidence of atrial fibrillation.  Patient with an atrial fibrillation diagnosis; CHA2DS2-VASc Score = 9 (age x 2, sex, CHF, CVA x2, HTN, vascular disease history, T2DM). Her rate and rhythm are currently being maintained without the use of pharmacological intervention.  Patient is not on beta-blocker therapy due to previous issues with significant bradycardia.  She is chronically anticoagulated on standard dose apixaban; compliant with therapy with no evidence or reports of GI bleeding.  Blood pressure well controlled at 120/64 mmHg on currently prescribed diuretic monotherapy.  Patient is on atorvastatin for her HLD diagnosis and further ASCVD prevention. T2DM loosely controlled on currently prescribed regimen; last HgbA1c was 8.0% when checked on 02/23/2023.  In the setting of known cardiovascular diagnoses and concurrent T2DM, patient is on an SGLT2i (empagliflozin) for added cardiovascular and renovascular protection. Patient has an OSAH diagnosis, however is noncompliant with prescribed nocturnal PAP therapy. Repeat PSG recommended. Functional capacity limited by age, deconditioning, and multiple medical comorbidities.  Patient questionably able to achieve 4 METS of physical activity without experiencing any significant angina/anginal equivalent symptoms.  No changes  were made to her medication regimen.  Patient to follow-up with outpatient cardiology in 6 months or sooner if needed.   Teresa [name] is scheduled for an L4-5 LATERAL LUMBAR INTERBODY FUSION AND POSTERIOR SPINAL FUSION on 06/18/2023 with Dr. Venetia Night, MD.  Given patient's past medical history significant for cardiovascular and cardiopulmonary diagnoses, presurgical clearances were sought from patient's specialty providers.  Clearances were obtained as follows.    Per pulmonary medicine, "ARISCAT ([place]et) Preoperative pulmonary risk index in adults- low risk: 1.6% pulmonary postoperative complication rate. Arozullah respiratory failure index- low risk: 1.8% pulmonary postoperative complication rate. Chales Abrahams calculator for postoperative respiratory failure- low risk: 1.07% probability of postoperative respiratory failure. Recommendation for incentive spirometry and CPAP postoperatively".  Per cardiology, "this patient is optimized for surgery and may proceed with the planned procedural course with a LOW risk of significant perioperative cardiovascular complications".  Again, this patient is on daily oral anticoagulation therapy using a DOAC medication.  She has been instructed on recommendations for holding her apixaban for 3 days prior to her procedure with plans to restart as soon as postoperative bleeding risk felt to be minimized by her attending surgeon. The patient has been instructed that her last dose of her apixaban should be on 06/14/2023.  Patient reports previous perioperative complications with anesthesia in the past. Patient has a PMH (+) for PONV. Symptoms and history of PONV will be discussed with patient by anesthesia team on the day of her procedure. Interventions will be ordered as deemed necessary based on patient's individual care needs as determined by anesthesiologist. Additionally, patient reporting (+) delayed emergence from general anesthesia when she was younger. In review  of the available records, it is noted that patient underwent a general anesthetic course here at [facility] in  02/2022 without documented complications.      06/06/2023    9:39 AM 05/08/2023    8:55 AM 04/25/2023    8:34 AM  Vitals with BMI  Height  5\' 2"    Weight 186 lbs 1 oz 180 lbs 13 oz   BMI  33.06   Systolic 128 130 595  Diastolic 69 78 75  Pulse 65  65    Providers/Specialists:   NOTE: Primary physician provider listed below. Patient may have been seen by APP or partner within same practice.   PROVIDER ROLE / SPECIALTY LAST Donalynn Furlong, MD Neurosurgery (Surgeon) 05/08/2023  Bosie Clos, MD Primary Care Provider 05/31/2023  Rudean Hitt, MD Cardiology 05/21/2023  Vida Rigger, MD Pulmonary Medicine 05/23/2023   Allergies:  Clarithromycin, Codeine, Dilaudid  [hydromorphone hcl], Iodine, Iohexol, Onion, Povidone-iodine, Tamiflu  [oseltamivir phosphate], Zolpidem, Bacitracin-neomycin-polymyxin, Benzalkonium chloride, Lidocaine hcl, Morphine, Neomycin-bacitracin zn-polymyx, Tape, and Tapentadol  Current Home Medications:   No current facility-administered medications for this encounter.    acetaminophen (TYLENOL) 500 MG tablet   albuterol (VENTOLIN HFA) 108 (90 Base) MCG/ACT inhaler   allopurinol (ZYLOPRIM) 100 MG tablet   apixaban (ELIQUIS) 5 MG TABS tablet   atorvastatin (LIPITOR) 10 MG tablet   donepezil (ARICEPT) 5 MG tablet   furosemide (LASIX) 40 MG tablet   gabapentin (NEURONTIN) 800 MG tablet   glipiZIDE (GLUCOTROL) 5 MG tablet   levothyroxine (SYNTHROID) 88 MCG tablet   metFORMIN (GLUCOPHAGE) 1000 MG tablet   nystatin (MYCOSTATIN/NYSTOP) powder   nystatin cream (MYCOSTATIN)   ondansetron (ZOFRAN) 4 MG tablet   OZEMPIC, 2 MG/DOSE, 8 MG/3ML SOPN   pioglitazone (ACTOS) 15 MG tablet   promethazine (PHENERGAN) 12.5 MG tablet   sertraline (ZOLOFT) 50 MG tablet   traMADol (ULTRAM) 50 MG tablet    Continuous Blood Gluc Sensor (FREESTYLE LIBRE 2 SENSOR) MISC   empagliflozin (JARDIANCE) 25 MG TABS tablet   potassium chloride (KLOR-CON) 10 MEQ tablet   History:   Past Medical History:  Diagnosis Date   (HFpEF) heart failure with preserved ejection fraction (HCC)    a.) TTE 09/22/2014: EF >55%, no RWMAs, mild MAC, normal RVSF, mild TR/PR, mod MR; b.) TTE 08/04/2019: EF >55%, no RWMAs, normal RVSF, triv MR/TR; c.) TTE 06/14/2021: EF 60-65%, no RMWAs, normal RVSF, triv MR   Anginal pain (HCC)    Anxiety    Aortic atherosclerosis (HCC)    Arthritis    Asthma    Beta-blockers contraindicated    a.) significant bradycardia in the past while on therapy   Bilateral lower extremity edema    Cerebral microvascular disease    Chronic bilateral low back pain with bilateral sciatica    Complication of anesthesia    a.) PONV; b.) delayed emergence when she was younger   COPD (chronic obstructive pulmonary disease) (HCC)    Coronary artery disease    a.) cCTA 11/01/2021: Ca+ score 174 (67th percentile for age/sex match) --> FFR demonstrated no hemodynamically significant stenosis   Depression    Diverticulosis    DOE (dyspnea on exertion)    Dyspnea    GERD (gastroesophageal reflux disease)    a.) no daily Tx; s/p Nissen fundoplication   Gout    Heart murmur    History of 2019 novel coronavirus disease (COVID-19) 05/12/2019   History of hiatal hernia    a.) s/p Nissen fundoplication   History of kidney stones    History of orthopnea    HLD (hyperlipidemia)    Hypertension  Hypothyroidism    Lumbar stenosis 2024   Memory loss    a.) s/p "mini stroke" in 2021; b.) on acetylcholinesterase inhibitor (donepazil)   Migraines    Mobitz type I Wenckebach atrioventricular block    a.) holter study 01/27/2022   On apixaban therapy    PAF (paroxysmal atrial fibrillation) (HCC)    a.) CHA2DS2-VASc = 63 (age x2, sex, HFpEF, CVA x2, HTN, vascular disease history, T2DM) as of 06/14/2023;  b.)  rate/rhythm maintained without pharmacological intervention; chronically anticoagulated using standard dose apixaban   Palpitations    Pneumonia due to COVID-19 virus 05/2019   PONV (postoperative nausea and vomiting)    PSVT (paroxysmal supraventricular tachycardia) (HCC) 01/27/2022   a.) holter study 01/27/2022 - longest lasting 17 beats at a rate of 109 bpm   Pulmonary HTN (HCC)    RBBB (right bundle branch block)    Scleroderma (HCC)    Sleep apnea    a.) does not require nocturnal PAP therapy   Stroke (HCC) 2021   a.) "mild" per patient; no findings on imaging; presented with transient aphasia, memory loss, extremity numbness   T2DM (type 2 diabetes mellitus) (HCC)    Transfusion of blood product refused for religious reason (Jehovah's witness)    Vertigo    Vitamin D deficiency    Past Surgical History:  Procedure Laterality Date   ABDOMINAL HYSTERECTOMY     ovaries intact   ANKLE ARTHROSCOPY Left 03/03/2022   Procedure: ANKLE ARTHROSCOPY;  Surgeon: Edwin Cap, DPM;  Location: [facility] ORS;  Service: Podiatry;  Laterality: Left;  GENERAL WITH POP AND SAPHENOUS BLOCK   BLADDER SURGERY     bladder tuck   BREAST BIOPSY Right 03/20/2016   neg x 2 area   CARDIAC CATHETERIZATION     CARPAL TUNNEL RELEASE Bilateral    CATARACT EXTRACTION W/PHACO Left 09/17/2018   Procedure: CATARACT EXTRACTION PHACO AND INTRAOCULAR LENS PLACEMENT (IOC) LEFT, DIABETIC;  Surgeon: Galen Manila, MD;  Location: [facility] ORS;  Service: Ophthalmology;  Laterality: Left;  [place] 00:34 CDE 4.85 Fluid pack lot # [id number]   CATARACT EXTRACTION W/PHACO Right 10/15/2018   Procedure: CATARACT EXTRACTION PHACO AND INTRAOCULAR LENS PLACEMENT (IOC)-RIGHT;  Surgeon: Galen Manila, MD;  Location: [facility] ORS;  Service: Ophthalmology;  Laterality: Right;  [place] 00:27.6 CDE 3.43 Fluid Pack Lot # [id number]   COLONOSCOPY WITH PROPOFOL N/A 09/27/2018   Procedure: COLONOSCOPY WITH PROPOFOL;  Surgeon: Pasty Spillers,  MD;  Location: [facility];  Service: Endoscopy;  Laterality: N/A;   DILATION AND CURETTAGE OF UTERUS     EYE SURGERY     eyelid   FRACTURE SURGERY     left ankle-plate and screws palced   GRAFT APPLICATION Left 03/03/2022   Procedure: BONE GRAFT;  Surgeon: Edwin Cap, DPM;  Location: [facility] ORS;  Service: Podiatry;  Laterality: Left;   HARDWARE REMOVAL Left 03/03/2022   Procedure: POSSIBLE HARDWARE REMOVAL;  Surgeon: Edwin Cap, DPM;  Location: [facility] ORS;  Service: Podiatry;  Laterality: Left;   LITHOTRIPSY     NISSEN FUNDOPLICATION     SHOULDER SURGERY Right    rotator cuff   TONSILLECTOMY     Family History  Problem Relation Age of Onset   Stroke Mother    Hypertension Mother    Heart disease Mother    Arthritis Mother    Heart disease Father    Hypertension Father    Diabetes Sister    Hypertension Sister    Asthma Sister  Hypertension Sister    Diverticulitis Sister    Colon cancer Maternal Grandmother    Breast cancer Maternal Grandmother    Breast cancer Maternal Aunt    Social History   Tobacco Use   Smoking status: Never   Smokeless tobacco: Never  Vaping Use   Vaping status: Never Used  Substance Use Topics   Alcohol use: Never   Drug use: Never    Pertinent Clinical Results:  LABS:   No visits with results within 3 Day(s) from this visit.  Latest known visit with results is:  Hospital Outpatient Visit on 06/06/2023  Component Date Value Ref Range Status   Potassium 06/06/2023 3.1 (L)  3.5 - 5.1 mmol/L Final   Performed at [facility], [address] [place], [place] [address]   MRSA, PCR 06/06/2023 NEGATIVE  NEGATIVE Final   Staphylococcus aureus 06/06/2023 NEGATIVE  NEGATIVE Final   Comment: (NOTE) The Xpert SA Assay (FDA approved for NASAL specimens in patients 36 years of age and older), is one component of a comprehensive surveillance program. It is not intended to diagnose infection nor to guide or monitor  treatment. Performed at [facility], [address] [place], [place] [address]    Color, Urine 06/06/2023 YELLOW (A)  YELLOW Final   APPearance 06/06/2023 CLEAR (A)  CLEAR Final   Specific Gravity, Urine 06/06/2023 1.039 (H)  1.005 - 1.030 Final   pH 06/06/2023 5.0  5.0 - 8.0 Final   Glucose, UA 06/06/2023 >=500 (A)  NEGATIVE mg/dL Final   Hgb urine dipstick 06/06/2023 SMALL (A)  NEGATIVE Final   Bilirubin Urine 06/06/2023 NEGATIVE  NEGATIVE Final   Ketones, ur 06/06/2023 5 (A)  NEGATIVE mg/dL Final   Protein, ur 62/13/0865 NEGATIVE  NEGATIVE mg/dL Final   Nitrite 78/46/9629 NEGATIVE  NEGATIVE Final   Leukocytes,Ua 06/06/2023 NEGATIVE  NEGATIVE Final   RBC / HPF 06/06/2023 0-5  0 - 5 RBC/hpf Final   WBC, UA 06/06/2023 0-5  0 - 5 WBC/hpf Final   Bacteria, UA 06/06/2023 NONE SEEN  NONE SEEN Final   Squamous Epithelial / HPF 06/06/2023 0-5  0 - 5 /HPF Final   Mucus 06/06/2023 PRESENT   Final   Performed at [facility], [address] [place], [place] [address]    ECG: Date: 06/06/2023 Time ECG obtained: 0958 AM Rate: 57 bpm Rhythm:  Sinus bradycardia with first-degree AV block; RBBB Axis (leads I and aVF): Normal Intervals: PR 252 ms. QRS 130. QTc 447 ms. ST segment and T wave changes: No evidence of acute ST segment elevation or depression.   Comparison: Similar to previous tracing obtained on 06/16/2019   IMAGING / PROCEDURES: MR LUMBAR SPINE WO CONTRAST performed on 02/28/2023 Prior posterior and interbody fusion at L5-S1 with solid arthrodesis. No foraminal or canal stenosis at this level. 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 Mild bilateral foraminal and subarticular recess stenosis at L3-4, also progressed.   LONG TERM CARDIAC EVENT MONITOR STUDY performed on 01/27/2022 HR 32 - 121 bpm, average 66 bpm. 6 nonsustained SVT, longest 17 beats at an  average rate of 109 bpm. Wenckebach present. Occasional supraventricular ectopy, 2.9% Rare ventricular ectopy. No sustained arrhythmias. No atrial fibrillation. Symptom triggered events correspond to sinus rhythm +/- PVC.   CT CORONARY MORPH W/CTA COR W/SCORE W/CA W/CM &/OR WO/CM; FRACTIONAL FLOW RESERVE DATA performed on 11/01/2021 Coronary calcium score of 174. This was 67th percentile for  age-sex, and race-matched controls Normal coronary origin with right dominance Mild to moderate atherosclerosis.  CAD RADS 2 Recommend preventive therapy and risk factor modification The study is limited by significant noise artifact FFR flow analysis demonstrates no hemodynamically flow limiting lesions. Left Main: No significant stenosis. LM FFR = 0.99. LAD: No significant stenosis. Proximal FFR = 0.98, Mid FFR = 0.92, Distal FFR =0.84. LCX: No significant stenosis. Proximal FFR = 0.99, Mid FFR = 0.99, Distal FFR = 0.96. RCA: No significant stenosis. Proximal FFR = 0.0.95, Mid FFR = 0.92, Distal FFR = 0.90.   TRANSTHORACIC ECHOCARDIOGRAM performed on 06/14/2021 Left ventricular ejection fraction, by estimation, is 60 to 65%. The left ventricle has normal function. The left ventricle has no regional wall motion abnormalities. Left ventricular diastolic parameters were normal.  Right ventricular systolic function is normal. The right ventricular size is normal.  The mitral valve is normal in structure. Trivial mitral valve regurgitation. No evidence of mitral stenosis.  The aortic valve is normal in structure. Aortic valve regurgitation is not visualized. No aortic stenosis is present.  The inferior vena cava is normal in size with greater than 50% respiratory variability, suggesting right atrial pressure of 3 mmHg.  Impression and Plan:  Teresa [name] has been referred for pre-anesthesia review and clearance prior to her undergoing the planned anesthetic and procedural courses. Available labs,  pertinent testing, and imaging results were personally reviewed by me in preparation for upcoming operative/procedural course. [facility] medical record has been updated following extensive record review and patient interview with PAT staff.   This patient has been appropriately cleared by cardiology (LOW) and by pulmonary medicine (LOW/ACCEPTABLE) with individually indicated risk of patient experiencing significant cardiovascular/cardiopulmonary complications during her perioperative course. Based on clinical review performed today (06/14/23), barring any significant acute changes in the patient's overall condition, it is anticipated that she will be able to proceed with the planned surgical intervention. Any acute changes in clinical condition may necessitate her procedure being postponed and/or cancelled. Patient will meet with anesthesia team (MD and/or CRNA) on the day of her procedure for preoperative evaluation/assessment. Questions regarding anesthetic course will be fielded at that time.   Pre-surgical instructions were reviewed with the patient during her PAT appointment, and questions were fielded to satisfaction by PAT clinical staff. She has been instructed on which medications that she will need to hold prior to surgery, as well as the ones that have been deemed safe/appropriate to take on the day of her procedure. As part of the general education provided by PAT, patient made aware both verbally and in writing, that she would need to abstain from the use of any illegal substances during her perioperative course.  She was advised that failure to follow the provided instructions could necessitate case cancellation or result in serious perioperative complications up to and including death. Patient encouraged to contact PAT and/or her surgeon's office to discuss any questions or concerns that may arise prior to surgery; verbalized understanding.   Quentin Mulling, MSN, APRN, FNP-C, CEN [facility]  Perioperative Services Nurse Practitioner Phone: [phone] Fax: [phone] 06/14/23 3:54 PM  NOTE: This note has been prepared using Dragon dictation software. Despite my best ability to proofread, there is always the potential that unintentional transcriptional errors may still occur from this process.

## 2023-06-17 MED ORDER — CEFAZOLIN IN SODIUM CHLORIDE 2-0.9 GM/100ML-% IV SOLN
2.0000 g | Freq: Once | INTRAVENOUS | Status: DC
  Filled 2023-06-17: qty 100

## 2023-06-17 MED ORDER — SODIUM CHLORIDE 0.9 % IV SOLN: INTRAVENOUS | Status: DC

## 2023-06-17 MED ORDER — VANCOMYCIN HCL IN DEXTROSE 1-5 GM/200ML-% IV SOLN
1000.0000 mg | Freq: Once | INTRAVENOUS | Status: AC
  Administered 2023-06-18: 1000 mg via INTRAVENOUS

## 2023-06-17 MED ORDER — CEFAZOLIN SODIUM-DEXTROSE 2-4 GM/100ML-% IV SOLN
2.0000 g | INTRAVENOUS | Status: AC
  Administered 2023-06-18: 2 g via INTRAVENOUS

## 2023-06-17 MED ORDER — ORAL CARE MOUTH RINSE: 15.0000 mL | Freq: Once | OROMUCOSAL | Status: AC

## 2023-06-17 MED ORDER — CHLORHEXIDINE GLUCONATE 0.12 % MT SOLN
15.0000 mL | Freq: Once | OROMUCOSAL | Status: AC
  Administered 2023-06-18: 15 mL via OROMUCOSAL

## 2023-06-18 ENCOUNTER — Encounter: Payer: Self-pay | Admitting: Neurosurgery

## 2023-06-18 ENCOUNTER — Inpatient Hospital Stay: Payer: PPO | Admitting: Urgent Care

## 2023-06-18 ENCOUNTER — Encounter
Admission: RE | Disposition: A | Discharge status: Skilled Nursing Facility | Payer: Self-pay | Source: Home / Self Care | Attending: Neurosurgery

## 2023-06-18 ENCOUNTER — Other Ambulatory Visit: Payer: Self-pay

## 2023-06-18 ENCOUNTER — Other Ambulatory Visit: Payer: Self-pay | Admitting: Family Medicine

## 2023-06-18 ENCOUNTER — Inpatient Hospital Stay: Payer: PPO

## 2023-06-18 ENCOUNTER — Inpatient Hospital Stay
Admission: RE | Admit: 2023-06-18 | Discharge: 2023-06-22 | DRG: 402 | Disposition: A | Discharge status: Skilled Nursing Facility | Payer: PPO | Attending: Neurosurgery | Admitting: Neurosurgery

## 2023-06-18 DIAGNOSIS — I2510 Atherosclerotic heart disease of native coronary artery without angina pectoris: Secondary | ICD-10-CM | POA: Diagnosis not present

## 2023-06-18 DIAGNOSIS — Z8673 Personal history of transient ischemic attack (TIA), and cerebral infarction without residual deficits: Secondary | ICD-10-CM

## 2023-06-18 DIAGNOSIS — I4510 Unspecified right bundle-branch block: Secondary | ICD-10-CM | POA: Diagnosis present

## 2023-06-18 DIAGNOSIS — M349 Systemic sclerosis, unspecified: Secondary | ICD-10-CM | POA: Diagnosis present

## 2023-06-18 DIAGNOSIS — G43909 Migraine, unspecified, not intractable, without status migrainosus: Secondary | ICD-10-CM | POA: Diagnosis present

## 2023-06-18 DIAGNOSIS — I441 Atrioventricular block, second degree: Secondary | ICD-10-CM | POA: Diagnosis not present

## 2023-06-18 DIAGNOSIS — Z833 Family history of diabetes mellitus: Secondary | ICD-10-CM

## 2023-06-18 DIAGNOSIS — M109 Gout, unspecified: Secondary | ICD-10-CM | POA: Diagnosis present

## 2023-06-18 DIAGNOSIS — Z825 Family history of asthma and other chronic lower respiratory diseases: Secondary | ICD-10-CM

## 2023-06-18 DIAGNOSIS — Z90710 Acquired absence of both cervix and uterus: Secondary | ICD-10-CM

## 2023-06-18 DIAGNOSIS — Z87442 Personal history of urinary calculi: Secondary | ICD-10-CM

## 2023-06-18 DIAGNOSIS — Z881 Allergy status to other antibiotic agents status: Secondary | ICD-10-CM

## 2023-06-18 DIAGNOSIS — R011 Cardiac murmur, unspecified: Secondary | ICD-10-CM | POA: Diagnosis not present

## 2023-06-18 DIAGNOSIS — E66811 Obesity, class 1: Secondary | ICD-10-CM | POA: Diagnosis present

## 2023-06-18 DIAGNOSIS — Z7901 Long term (current) use of anticoagulants: Secondary | ICD-10-CM

## 2023-06-18 DIAGNOSIS — E039 Hypothyroidism, unspecified: Secondary | ICD-10-CM | POA: Diagnosis not present

## 2023-06-18 DIAGNOSIS — M48061 Spinal stenosis, lumbar region without neurogenic claudication: Principal | ICD-10-CM | POA: Diagnosis present

## 2023-06-18 DIAGNOSIS — M5442 Lumbago with sciatica, left side: Secondary | ICD-10-CM | POA: Diagnosis present

## 2023-06-18 DIAGNOSIS — R531 Weakness: Secondary | ICD-10-CM | POA: Diagnosis present

## 2023-06-18 DIAGNOSIS — Z823 Family history of stroke: Secondary | ICD-10-CM

## 2023-06-18 DIAGNOSIS — Z803 Family history of malignant neoplasm of breast: Secondary | ICD-10-CM

## 2023-06-18 DIAGNOSIS — G4730 Sleep apnea, unspecified: Secondary | ICD-10-CM | POA: Diagnosis present

## 2023-06-18 DIAGNOSIS — M4326 Fusion of spine, lumbar region: Secondary | ICD-10-CM | POA: Diagnosis not present

## 2023-06-18 DIAGNOSIS — F32A Depression, unspecified: Secondary | ICD-10-CM | POA: Diagnosis not present

## 2023-06-18 DIAGNOSIS — I272 Pulmonary hypertension, unspecified: Secondary | ICD-10-CM | POA: Diagnosis present

## 2023-06-18 DIAGNOSIS — I10 Essential (primary) hypertension: Secondary | ICD-10-CM | POA: Diagnosis not present

## 2023-06-18 DIAGNOSIS — R413 Other amnesia: Secondary | ICD-10-CM | POA: Diagnosis present

## 2023-06-18 DIAGNOSIS — J4489 Other specified chronic obstructive pulmonary disease: Secondary | ICD-10-CM | POA: Diagnosis present

## 2023-06-18 DIAGNOSIS — R488 Other symbolic dysfunctions: Secondary | ICD-10-CM | POA: Diagnosis not present

## 2023-06-18 DIAGNOSIS — E1165 Type 2 diabetes mellitus with hyperglycemia: Secondary | ICD-10-CM | POA: Diagnosis not present

## 2023-06-18 DIAGNOSIS — K219 Gastro-esophageal reflux disease without esophagitis: Secondary | ICD-10-CM | POA: Diagnosis present

## 2023-06-18 DIAGNOSIS — M5441 Lumbago with sciatica, right side: Secondary | ICD-10-CM | POA: Diagnosis not present

## 2023-06-18 DIAGNOSIS — R41841 Cognitive communication deficit: Secondary | ICD-10-CM | POA: Diagnosis not present

## 2023-06-18 DIAGNOSIS — F419 Anxiety disorder, unspecified: Secondary | ICD-10-CM | POA: Diagnosis present

## 2023-06-18 DIAGNOSIS — Z981 Arthrodesis status: Secondary | ICD-10-CM

## 2023-06-18 DIAGNOSIS — Z7401 Bed confinement status: Secondary | ICD-10-CM | POA: Diagnosis not present

## 2023-06-18 DIAGNOSIS — Z01818 Encounter for other preprocedural examination: Secondary | ICD-10-CM

## 2023-06-18 DIAGNOSIS — J45909 Unspecified asthma, uncomplicated: Secondary | ICD-10-CM | POA: Diagnosis not present

## 2023-06-18 DIAGNOSIS — I5032 Chronic diastolic (congestive) heart failure: Secondary | ICD-10-CM | POA: Diagnosis not present

## 2023-06-18 DIAGNOSIS — I480 Paroxysmal atrial fibrillation: Secondary | ICD-10-CM | POA: Diagnosis not present

## 2023-06-18 DIAGNOSIS — Z8701 Personal history of pneumonia (recurrent): Secondary | ICD-10-CM

## 2023-06-18 DIAGNOSIS — I700 Atherosclerosis of aorta: Secondary | ICD-10-CM | POA: Diagnosis present

## 2023-06-18 DIAGNOSIS — M5136 Other intervertebral disc degeneration, lumbar region with discogenic back pain only: Secondary | ICD-10-CM

## 2023-06-18 DIAGNOSIS — Z8616 Personal history of COVID-19: Secondary | ICD-10-CM | POA: Diagnosis not present

## 2023-06-18 DIAGNOSIS — I4891 Unspecified atrial fibrillation: Secondary | ICD-10-CM | POA: Diagnosis not present

## 2023-06-18 DIAGNOSIS — Z885 Allergy status to narcotic agent status: Secondary | ICD-10-CM

## 2023-06-18 DIAGNOSIS — M6281 Muscle weakness (generalized): Secondary | ICD-10-CM | POA: Diagnosis not present

## 2023-06-18 DIAGNOSIS — E119 Type 2 diabetes mellitus without complications: Secondary | ICD-10-CM | POA: Diagnosis not present

## 2023-06-18 DIAGNOSIS — M51369 Other intervertebral disc degeneration, lumbar region without mention of lumbar back pain or lower extremity pain: Secondary | ICD-10-CM | POA: Diagnosis not present

## 2023-06-18 DIAGNOSIS — Z800 Family history of malignant neoplasm of digestive organs: Secondary | ICD-10-CM | POA: Diagnosis not present

## 2023-06-18 DIAGNOSIS — Z888 Allergy status to other drugs, medicaments and biological substances status: Secondary | ICD-10-CM

## 2023-06-18 DIAGNOSIS — J449 Chronic obstructive pulmonary disease, unspecified: Secondary | ICD-10-CM | POA: Diagnosis not present

## 2023-06-18 DIAGNOSIS — G8929 Other chronic pain: Secondary | ICD-10-CM | POA: Diagnosis not present

## 2023-06-18 DIAGNOSIS — E785 Hyperlipidemia, unspecified: Secondary | ICD-10-CM | POA: Diagnosis present

## 2023-06-18 DIAGNOSIS — Z8249 Family history of ischemic heart disease and other diseases of the circulatory system: Secondary | ICD-10-CM

## 2023-06-18 DIAGNOSIS — E1140 Type 2 diabetes mellitus with diabetic neuropathy, unspecified: Secondary | ICD-10-CM

## 2023-06-18 DIAGNOSIS — Z8261 Family history of arthritis: Secondary | ICD-10-CM

## 2023-06-18 DIAGNOSIS — I110 Hypertensive heart disease with heart failure: Secondary | ICD-10-CM | POA: Diagnosis not present

## 2023-06-18 DIAGNOSIS — Z79899 Other long term (current) drug therapy: Secondary | ICD-10-CM

## 2023-06-18 DIAGNOSIS — Z01812 Encounter for preprocedural laboratory examination: Principal | ICD-10-CM

## 2023-06-18 DIAGNOSIS — Z91048 Other nonmedicinal substance allergy status: Secondary | ICD-10-CM

## 2023-06-18 DIAGNOSIS — E876 Hypokalemia: Secondary | ICD-10-CM

## 2023-06-18 DIAGNOSIS — R1311 Dysphagia, oral phase: Secondary | ICD-10-CM | POA: Diagnosis not present

## 2023-06-18 DIAGNOSIS — R001 Bradycardia, unspecified: Secondary | ICD-10-CM | POA: Diagnosis present

## 2023-06-18 HISTORY — DX: Diverticulosis of intestine, part unspecified, without perforation or abscess without bleeding: K57.90

## 2023-06-18 HISTORY — DX: Unspecified diastolic (congestive) heart failure: I50.30

## 2023-06-18 HISTORY — DX: Paroxysmal atrial fibrillation: I48.0

## 2023-06-18 HISTORY — DX: Other cerebrovascular disease: I67.89

## 2023-06-18 HISTORY — PX: APPLICATION OF INTRAOPERATIVE CT SCAN: SHX6668

## 2023-06-18 HISTORY — PX: ANTERIOR LATERAL LUMBAR FUSION WITH PERCUTANEOUS SCREW 1 LEVEL: SHX5553

## 2023-06-18 HISTORY — DX: Anxiety disorder, unspecified: F41.9

## 2023-06-18 HISTORY — DX: Other amnesia: R41.3

## 2023-06-18 HISTORY — DX: Other chronic pain: G89.29

## 2023-06-18 HISTORY — DX: Procedure and treatment not carried out because of other contraindication: Z53.09

## 2023-06-18 LAB — POCT I-STAT, CHEM 8
BUN: 19 mg/dL (ref 8–23)
Calcium, Ion: 1.09 mmol/L — ABNORMAL LOW (ref 1.15–1.40)
Chloride: 104 mmol/L (ref 98–111)
Creatinine, Ser: 0.6 mg/dL (ref 0.44–1.00)
Glucose, Bld: 155 mg/dL — ABNORMAL HIGH (ref 70–99)
HCT: 46 % (ref 36.0–46.0)
Hemoglobin: 15.6 g/dL — ABNORMAL HIGH (ref 12.0–15.0)
Potassium: 3.9 mmol/L (ref 3.5–5.1)
Sodium: 138 mmol/L (ref 135–145)
TCO2: 23 mmol/L (ref 22–32)

## 2023-06-18 LAB — GLUCOSE, CAPILLARY
Glucose-Capillary: 206 mg/dL — ABNORMAL HIGH (ref 70–99)
Glucose-Capillary: 230 mg/dL — ABNORMAL HIGH (ref 70–99)
Glucose-Capillary: 286 mg/dL — ABNORMAL HIGH (ref 70–99)
Glucose-Capillary: 293 mg/dL — ABNORMAL HIGH (ref 70–99)

## 2023-06-18 LAB — HEMOGLOBIN A1C
Hgb A1c MFr Bld: 7.5 % — ABNORMAL HIGH (ref 4.8–5.6)
Mean Plasma Glucose: 168.55 mg/dL

## 2023-06-18 SURGERY — ANTERIOR LATERAL LUMBAR FUSION WITH PERCUTANEOUS SCREW 1 LEVEL
Anesthesia: General | Site: Spine Lumbar

## 2023-06-18 MED ORDER — DEXAMETHASONE SODIUM PHOSPHATE 10 MG/ML IJ SOLN
INTRAMUSCULAR | Status: AC
  Filled 2023-06-18: qty 1

## 2023-06-18 MED ORDER — MENTHOL 3 MG MT LOZG: 1.0000 | LOZENGE | OROMUCOSAL | Status: DC | PRN

## 2023-06-18 MED ORDER — SERTRALINE HCL 50 MG PO TABS
50.0000 mg | ORAL_TABLET | Freq: Every day | ORAL | Status: DC
  Administered 2023-06-18 – 2023-06-21 (×4): 50 mg via ORAL
  Filled 2023-06-18 (×4): qty 1

## 2023-06-18 MED ORDER — EPINEPHRINE PF 1 MG/ML IJ SOLN
INTRAMUSCULAR | Status: AC
  Filled 2023-06-18: qty 1

## 2023-06-18 MED ORDER — SODIUM CHLORIDE FLUSH 0.9 % IV SOLN
INTRAVENOUS | Status: AC
  Filled 2023-06-18: qty 20

## 2023-06-18 MED ORDER — FENTANYL CITRATE (PF) 100 MCG/2ML IJ SOLN
INTRAMUSCULAR | Status: DC | PRN
  Administered 2023-06-18: 50 ug via INTRAVENOUS

## 2023-06-18 MED ORDER — CEFAZOLIN SODIUM-DEXTROSE 2-4 GM/100ML-% IV SOLN
INTRAVENOUS | Status: AC
  Filled 2023-06-18: qty 100

## 2023-06-18 MED ORDER — FENTANYL CITRATE PF 50 MCG/ML IJ SOSY: 12.5000 ug | PREFILLED_SYRINGE | INTRAMUSCULAR | Status: AC | PRN

## 2023-06-18 MED ORDER — DOCUSATE SODIUM 100 MG PO CAPS
100.0000 mg | ORAL_CAPSULE | Freq: Two times a day (BID) | ORAL | Status: DC
  Administered 2023-06-18 – 2023-06-22 (×8): 100 mg via ORAL
  Filled 2023-06-18 (×8): qty 1

## 2023-06-18 MED ORDER — OXYCODONE HCL 5 MG PO TABS
5.0000 mg | ORAL_TABLET | ORAL | Status: DC | PRN
  Administered 2023-06-18 – 2023-06-19 (×2): 5 mg via ORAL
  Filled 2023-06-18 (×2): qty 1

## 2023-06-18 MED ORDER — EPHEDRINE 5 MG/ML INJ
INTRAVENOUS | Status: AC
  Filled 2023-06-18: qty 5

## 2023-06-18 MED ORDER — HYDROMORPHONE HCL 1 MG/ML IJ SOLN
INTRAMUSCULAR | Status: DC | PRN
  Administered 2023-06-18: .5 mg via INTRAVENOUS

## 2023-06-18 MED ORDER — IRRISEPT - 450ML BOTTLE WITH 0.05% CHG IN STERILE WATER, USP 99.95% OPTIME
TOPICAL | Status: DC | PRN
  Administered 2023-06-18: 450 mL

## 2023-06-18 MED ORDER — SORBITOL 70 % SOLN
30.0000 mL | Freq: Every day | Status: DC | PRN
  Administered 2023-06-21: 30 mL via ORAL
  Filled 2023-06-18 (×2): qty 30

## 2023-06-18 MED ORDER — ENOXAPARIN SODIUM 40 MG/0.4ML IJ SOSY
40.0000 mg | PREFILLED_SYRINGE | INTRAMUSCULAR | Status: DC
  Administered 2023-06-19 – 2023-06-22 (×4): 40 mg via SUBCUTANEOUS
  Filled 2023-06-18 (×4): qty 0.4

## 2023-06-18 MED ORDER — DROPERIDOL 2.5 MG/ML IJ SOLN
0.6250 mg | Freq: Once | INTRAMUSCULAR | Status: AC
  Administered 2023-06-18: 0.625 mg via INTRAVENOUS

## 2023-06-18 MED ORDER — SODIUM CHLORIDE 0.9% FLUSH: 3.0000 mL | INTRAVENOUS | Status: DC | PRN

## 2023-06-18 MED ORDER — REMIFENTANIL HCL 1 MG IV SOLR
INTRAVENOUS | Status: DC | PRN
  Administered 2023-06-18: .1 ug/kg/min via INTRAVENOUS

## 2023-06-18 MED ORDER — POLYETHYLENE GLYCOL 3350 17 G PO PACK: 17.0000 g | PACK | Freq: Every day | ORAL | Status: DC | PRN

## 2023-06-18 MED ORDER — 0.9 % SODIUM CHLORIDE (POUR BTL) OPTIME
TOPICAL | Status: DC | PRN
  Administered 2023-06-18: 500 mL

## 2023-06-18 MED ORDER — INSULIN ASPART 100 UNIT/ML IJ SOLN
3.0000 [IU] | Freq: Once | INTRAMUSCULAR | Status: AC
  Administered 2023-06-18: 3 [IU] via SUBCUTANEOUS

## 2023-06-18 MED ORDER — NYSTATIN 100000 UNIT/GM EX CREA: 1.0000 | TOPICAL_CREAM | Freq: Two times a day (BID) | CUTANEOUS | Status: DC | PRN

## 2023-06-18 MED ORDER — CHLORHEXIDINE GLUCONATE 0.12 % MT SOLN
OROMUCOSAL | Status: AC
  Filled 2023-06-18: qty 15

## 2023-06-18 MED ORDER — DEXAMETHASONE SODIUM PHOSPHATE 10 MG/ML IJ SOLN
INTRAMUSCULAR | Status: DC | PRN
  Administered 2023-06-18: 10 mg via INTRAVENOUS

## 2023-06-18 MED ORDER — EMPAGLIFLOZIN 25 MG PO TABS
25.0000 mg | ORAL_TABLET | Freq: Every day | ORAL | Status: DC
  Administered 2023-06-18 – 2023-06-22 (×5): 25 mg via ORAL
  Filled 2023-06-18 (×5): qty 1

## 2023-06-18 MED ORDER — HYDROMORPHONE HCL 1 MG/ML IJ SOLN
0.2500 mg | INTRAMUSCULAR | Status: DC | PRN
  Administered 2023-06-18 (×2): 0.5 mg via INTRAVENOUS

## 2023-06-18 MED ORDER — PROPOFOL 500 MG/50ML IV EMUL
INTRAVENOUS | Status: DC | PRN
Start: 2023-06-18 — End: 2023-06-18
  Administered 2023-06-18: 150 ug via INTRAVENOUS
  Administered 2023-06-18: 125 ug/kg/min via INTRAVENOUS

## 2023-06-18 MED ORDER — FUROSEMIDE 40 MG PO TABS
40.0000 mg | ORAL_TABLET | Freq: Every day | ORAL | Status: DC
  Administered 2023-06-18 – 2023-06-22 (×5): 40 mg via ORAL
  Filled 2023-06-18 (×5): qty 1

## 2023-06-18 MED ORDER — MAGNESIUM CITRATE PO SOLN: 1.0000 | Freq: Once | ORAL | Status: DC | PRN

## 2023-06-18 MED ORDER — POTASSIUM CHLORIDE CRYS ER 10 MEQ PO TBCR
10.0000 meq | EXTENDED_RELEASE_TABLET | Freq: Every day | ORAL | Status: DC
  Administered 2023-06-19 – 2023-06-22 (×4): 10 meq via ORAL
  Filled 2023-06-18 (×4): qty 1

## 2023-06-18 MED ORDER — ACETAMINOPHEN 10 MG/ML IV SOLN
INTRAVENOUS | Status: AC
  Filled 2023-06-18: qty 100

## 2023-06-18 MED ORDER — BUPIVACAINE-EPINEPHRINE 0.5% -1:200000 IJ SOLN
INTRAMUSCULAR | Status: DC | PRN
  Administered 2023-06-18: 10 mL
  Administered 2023-06-18: 5 mL

## 2023-06-18 MED ORDER — INSULIN ASPART 100 UNIT/ML IJ SOLN
INTRAMUSCULAR | Status: AC
  Filled 2023-06-18: qty 1

## 2023-06-18 MED ORDER — ONDANSETRON HCL 4 MG/2ML IJ SOLN
INTRAMUSCULAR | Status: AC
  Filled 2023-06-18: qty 2

## 2023-06-18 MED ORDER — BUPIVACAINE HCL (PF) 0.5 % IJ SOLN
INTRAMUSCULAR | Status: AC
  Filled 2023-06-18: qty 30

## 2023-06-18 MED ORDER — OXYCODONE HCL 5 MG PO TABS
5.0000 mg | ORAL_TABLET | Freq: Once | ORAL | Status: AC | PRN
  Administered 2023-06-18: 5 mg via ORAL

## 2023-06-18 MED ORDER — PIOGLITAZONE HCL 15 MG PO TABS
15.0000 mg | ORAL_TABLET | Freq: Every morning | ORAL | Status: DC
  Administered 2023-06-18 – 2023-06-22 (×5): 15 mg via ORAL
  Filled 2023-06-18 (×5): qty 1

## 2023-06-18 MED ORDER — SODIUM CHLORIDE (PF) 0.9 % IJ SOLN
INTRAMUSCULAR | Status: DC | PRN
  Administered 2023-06-18: 60 mL via INTRAMUSCULAR

## 2023-06-18 MED ORDER — HYDROMORPHONE HCL 1 MG/ML IJ SOLN
INTRAMUSCULAR | Status: AC
  Filled 2023-06-18: qty 1

## 2023-06-18 MED ORDER — OXYCODONE HCL 5 MG PO TABS
10.0000 mg | ORAL_TABLET | ORAL | Status: DC | PRN
  Administered 2023-06-18 – 2023-06-22 (×12): 10 mg via ORAL
  Filled 2023-06-18 (×12): qty 2

## 2023-06-18 MED ORDER — GABAPENTIN 400 MG PO CAPS
800.0000 mg | ORAL_CAPSULE | Freq: Three times a day (TID) | ORAL | Status: DC
  Administered 2023-06-18 – 2023-06-20 (×6): 800 mg via ORAL
  Filled 2023-06-18 (×6): qty 2

## 2023-06-18 MED ORDER — SENNA 8.6 MG PO TABS
1.0000 | ORAL_TABLET | Freq: Two times a day (BID) | ORAL | Status: DC
  Administered 2023-06-18 – 2023-06-22 (×8): 8.6 mg via ORAL
  Filled 2023-06-18 (×8): qty 1

## 2023-06-18 MED ORDER — SUCCINYLCHOLINE CHLORIDE 200 MG/10ML IV SOSY
PREFILLED_SYRINGE | INTRAVENOUS | Status: DC | PRN
  Administered 2023-06-18: 40 mg via INTRAVENOUS

## 2023-06-18 MED ORDER — METFORMIN HCL 500 MG PO TABS
1000.0000 mg | ORAL_TABLET | Freq: Two times a day (BID) | ORAL | Status: DC
  Administered 2023-06-18 – 2023-06-22 (×8): 1000 mg via ORAL
  Filled 2023-06-18 (×8): qty 2

## 2023-06-18 MED ORDER — SURGIFLO WITH THROMBIN (HEMOSTATIC MATRIX KIT) OPTIME
TOPICAL | Status: DC | PRN
  Administered 2023-06-18: 1 via TOPICAL

## 2023-06-18 MED ORDER — PROPOFOL 1000 MG/100ML IV EMUL
INTRAVENOUS | Status: AC
  Filled 2023-06-18: qty 100

## 2023-06-18 MED ORDER — PROPOFOL 1000 MG/100ML IV EMUL
INTRAVENOUS | Status: AC
Start: 2023-06-18 — End: ?
  Filled 2023-06-18: qty 100

## 2023-06-18 MED ORDER — KETAMINE HCL 50 MG/5ML IJ SOSY
PREFILLED_SYRINGE | INTRAMUSCULAR | Status: DC | PRN
  Administered 2023-06-18 (×2): 10 mg via INTRAVENOUS
  Administered 2023-06-18: 30 mg via INTRAVENOUS

## 2023-06-18 MED ORDER — KETOROLAC TROMETHAMINE 30 MG/ML IJ SOLN
INTRAMUSCULAR | Status: DC | PRN
  Administered 2023-06-18: 30 mg via INTRAVENOUS

## 2023-06-18 MED ORDER — KETOROLAC TROMETHAMINE 15 MG/ML IJ SOLN
7.5000 mg | Freq: Four times a day (QID) | INTRAMUSCULAR | Status: AC
  Administered 2023-06-18 – 2023-06-19 (×4): 7.5 mg via INTRAVENOUS
  Filled 2023-06-18 (×4): qty 1

## 2023-06-18 MED ORDER — GLIPIZIDE 5 MG PO TABS
2.5000 mg | ORAL_TABLET | Freq: Every day | ORAL | Status: DC
  Administered 2023-06-19 – 2023-06-22 (×4): 2.5 mg via ORAL
  Filled 2023-06-18 (×4): qty 0.5

## 2023-06-18 MED ORDER — ACETAMINOPHEN 500 MG PO TABS
1000.0000 mg | ORAL_TABLET | Freq: Four times a day (QID) | ORAL | Status: DC
  Administered 2023-06-18 – 2023-06-22 (×12): 1000 mg via ORAL
  Filled 2023-06-18 (×12): qty 2

## 2023-06-18 MED ORDER — PHENYLEPHRINE HCL-NACL 20-0.9 MG/250ML-% IV SOLN
INTRAVENOUS | Status: DC | PRN
  Administered 2023-06-18: 40 ug/min via INTRAVENOUS
  Administered 2023-06-18 (×2): 160 ug via INTRAVENOUS

## 2023-06-18 MED ORDER — NYSTATIN 100000 UNIT/GM EX POWD: 1.0000 | Freq: Three times a day (TID) | CUTANEOUS | Status: DC | PRN

## 2023-06-18 MED ORDER — DROPERIDOL 2.5 MG/ML IJ SOLN
INTRAMUSCULAR | Status: AC
  Filled 2023-06-18: qty 2

## 2023-06-18 MED ORDER — LIDOCAINE HCL (PF) 2 % IJ SOLN
INTRAMUSCULAR | Status: AC
  Filled 2023-06-18: qty 5

## 2023-06-18 MED ORDER — FENTANYL CITRATE (PF) 100 MCG/2ML IJ SOLN
INTRAMUSCULAR | Status: AC
  Filled 2023-06-18: qty 2

## 2023-06-18 MED ORDER — PHENOL 1.4 % MT LIQD: 1.0000 | OROMUCOSAL | Status: DC | PRN

## 2023-06-18 MED ORDER — ATORVASTATIN CALCIUM 10 MG PO TABS
10.0000 mg | ORAL_TABLET | Freq: Every day | ORAL | Status: DC
  Administered 2023-06-18 – 2023-06-22 (×5): 10 mg via ORAL
  Filled 2023-06-18 (×5): qty 1

## 2023-06-18 MED ORDER — ALLOPURINOL 100 MG PO TABS
100.0000 mg | ORAL_TABLET | Freq: Every day | ORAL | Status: DC
  Administered 2023-06-18 – 2023-06-21 (×4): 100 mg via ORAL
  Filled 2023-06-18 (×4): qty 1

## 2023-06-18 MED ORDER — LEVOTHYROXINE SODIUM 88 MCG PO TABS
88.0000 ug | ORAL_TABLET | Freq: Every day | ORAL | Status: DC
  Administered 2023-06-19 – 2023-06-22 (×4): 88 ug via ORAL
  Filled 2023-06-18 (×4): qty 1

## 2023-06-18 MED ORDER — ONDANSETRON HCL 4 MG/2ML IJ SOLN
INTRAMUSCULAR | Status: DC | PRN
  Administered 2023-06-18: 4 mg via INTRAVENOUS

## 2023-06-18 MED ORDER — OXYCODONE HCL 5 MG PO TABS
ORAL_TABLET | ORAL | Status: AC
  Filled 2023-06-18: qty 1

## 2023-06-18 MED ORDER — EPHEDRINE SULFATE-NACL 50-0.9 MG/10ML-% IV SOSY
PREFILLED_SYRINGE | INTRAVENOUS | Status: DC | PRN
  Administered 2023-06-18 (×2): 10 mg via INTRAVENOUS

## 2023-06-18 MED ORDER — PHENYLEPHRINE 80 MCG/ML (10ML) SYRINGE FOR IV PUSH (FOR BLOOD PRESSURE SUPPORT)
PREFILLED_SYRINGE | INTRAVENOUS | Status: AC
Start: 2023-06-18 — End: ?
  Filled 2023-06-18: qty 10

## 2023-06-18 MED ORDER — SUCCINYLCHOLINE CHLORIDE 200 MG/10ML IV SOSY
PREFILLED_SYRINGE | INTRAVENOUS | Status: AC
Start: 2023-06-18 — End: ?
  Filled 2023-06-18: qty 10

## 2023-06-18 MED ORDER — THROMBIN 5000 UNITS EX SOLR
CUTANEOUS | Status: AC
Start: 2023-06-18 — End: ?
  Filled 2023-06-18: qty 5000

## 2023-06-18 MED ORDER — REMIFENTANIL HCL 1 MG IV SOLR
INTRAVENOUS | Status: AC
  Filled 2023-06-18: qty 1000

## 2023-06-18 MED ORDER — INSULIN ASPART 100 UNIT/ML IJ SOLN
0.0000 [IU] | Freq: Three times a day (TID) | INTRAMUSCULAR | Status: DC
  Administered 2023-06-18: 5 [IU] via SUBCUTANEOUS
  Administered 2023-06-19: 8 [IU] via SUBCUTANEOUS
  Administered 2023-06-19: 5 [IU] via SUBCUTANEOUS
  Administered 2023-06-19: 3 [IU] via SUBCUTANEOUS
  Administered 2023-06-20: 2 [IU] via SUBCUTANEOUS
  Administered 2023-06-20: 3 [IU] via SUBCUTANEOUS
  Administered 2023-06-20 – 2023-06-21 (×3): 2 [IU] via SUBCUTANEOUS
  Administered 2023-06-21: 5 [IU] via SUBCUTANEOUS
  Administered 2023-06-22: 2 [IU] via SUBCUTANEOUS
  Filled 2023-06-18 (×12): qty 1

## 2023-06-18 MED ORDER — PHENYLEPHRINE HCL-NACL 20-0.9 MG/250ML-% IV SOLN
INTRAVENOUS | Status: AC
  Filled 2023-06-18: qty 250

## 2023-06-18 MED ORDER — MIDAZOLAM HCL 5 MG/5ML IJ SOLN
INTRAMUSCULAR | Status: DC | PRN
  Administered 2023-06-18: 1 mg via INTRAVENOUS

## 2023-06-18 MED ORDER — MIDAZOLAM HCL 2 MG/2ML IJ SOLN
INTRAMUSCULAR | Status: AC
  Filled 2023-06-18: qty 2

## 2023-06-18 MED ORDER — INSULIN ASPART 100 UNIT/ML IJ SOLN
0.0000 [IU] | Freq: Every day | INTRAMUSCULAR | Status: DC
  Administered 2023-06-18: 3 [IU] via SUBCUTANEOUS
  Administered 2023-06-19: 2 [IU] via SUBCUTANEOUS
  Filled 2023-06-18 (×2): qty 1

## 2023-06-18 MED ORDER — ACETAMINOPHEN 650 MG RE SUPP: 650.0000 mg | RECTAL | Status: DC | PRN

## 2023-06-18 MED ORDER — METHOCARBAMOL 1000 MG/10ML IJ SOLN
500.0000 mg | Freq: Four times a day (QID) | INTRAMUSCULAR | Status: DC | PRN
  Administered 2023-06-18: 500 mg via INTRAVENOUS
  Filled 2023-06-18: qty 5

## 2023-06-18 MED ORDER — KETAMINE HCL 50 MG/5ML IJ SOSY
PREFILLED_SYRINGE | INTRAMUSCULAR | Status: AC
Start: 2023-06-18 — End: ?
  Filled 2023-06-18: qty 5

## 2023-06-18 MED ORDER — ONDANSETRON HCL 4 MG/2ML IJ SOLN
4.0000 mg | Freq: Four times a day (QID) | INTRAMUSCULAR | Status: DC | PRN
  Administered 2023-06-19 – 2023-06-21 (×3): 4 mg via INTRAVENOUS
  Filled 2023-06-18 (×3): qty 2

## 2023-06-18 MED ORDER — OXYCODONE HCL 5 MG/5ML PO SOLN: 5.0000 mg | Freq: Once | ORAL | Status: AC | PRN

## 2023-06-18 MED ORDER — DONEPEZIL HCL 5 MG PO TABS
5.0000 mg | ORAL_TABLET | Freq: Every day | ORAL | Status: DC
  Administered 2023-06-18 – 2023-06-21 (×4): 5 mg via ORAL
  Filled 2023-06-18 (×4): qty 1

## 2023-06-18 MED ORDER — ACETAMINOPHEN 10 MG/ML IV SOLN
INTRAVENOUS | Status: DC | PRN
  Administered 2023-06-18: 1000 mg via INTRAVENOUS

## 2023-06-18 MED ORDER — METHOCARBAMOL 500 MG PO TABS
500.0000 mg | ORAL_TABLET | Freq: Four times a day (QID) | ORAL | Status: DC | PRN
  Administered 2023-06-20: 500 mg via ORAL
  Filled 2023-06-18: qty 1

## 2023-06-18 MED ORDER — ALBUTEROL SULFATE (2.5 MG/3ML) 0.083% IN NEBU: 3.0000 mL | INHALATION_SOLUTION | Freq: Four times a day (QID) | RESPIRATORY_TRACT | Status: DC | PRN

## 2023-06-18 MED ORDER — ACETAMINOPHEN 325 MG PO TABS
650.0000 mg | ORAL_TABLET | ORAL | Status: DC | PRN
  Administered 2023-06-18: 650 mg via ORAL
  Filled 2023-06-18: qty 2

## 2023-06-18 MED ORDER — BUPIVACAINE LIPOSOME 1.3 % IJ SUSP
INTRAMUSCULAR | Status: AC
  Filled 2023-06-18: qty 20

## 2023-06-18 MED ORDER — ONDANSETRON HCL 4 MG PO TABS
4.0000 mg | ORAL_TABLET | Freq: Four times a day (QID) | ORAL | Status: DC | PRN
  Administered 2023-06-20: 4 mg via ORAL
  Filled 2023-06-18: qty 1

## 2023-06-18 MED ORDER — VANCOMYCIN HCL IN DEXTROSE 1-5 GM/200ML-% IV SOLN
INTRAVENOUS | Status: AC
  Filled 2023-06-18: qty 200

## 2023-06-18 MED ORDER — LIDOCAINE HCL (PF) 2 % IJ SOLN
INTRAMUSCULAR | Status: DC | PRN
Start: 2023-06-18 — End: 2023-06-18
  Administered 2023-06-18: 80 mg via INTRADERMAL

## 2023-06-18 SURGICAL SUPPLY — 85 items
ADH SKN CLS APL DERMABOND .7 (GAUZE/BANDAGES/DRESSINGS) ×3
AGENT HMST KT MTR STRL THRMB (HEMOSTASIS) ×1
ALLOGRAFT BONESTRIP KORE 2.5X5 (Bone Implant) IMPLANT
APL PRP STRL LF DISP 70% ISPRP (MISCELLANEOUS) ×2
BASIN KIT SINGLE STR (MISCELLANEOUS) ×1 IMPLANT
BLADE BOVIE TIP EXT 4 (BLADE) IMPLANT
BUR MATCHSTICK NEURO 3.0 LAGG (BURR) IMPLANT
CHLORAPREP W/TINT 26 (MISCELLANEOUS) ×2 IMPLANT
CORD BIP STRL DISP 12FT (MISCELLANEOUS) ×1 IMPLANT
CORD LIGHT LATERIAL X LIFT (MISCELLANEOUS) IMPLANT
COVERAGE SUPP BRAINLAB NG SPNE (MISCELLANEOUS) IMPLANT
COVERAGE SUPPORT SPINE BRAINLB (MISCELLANEOUS)
DERMABOND ADVANCED .7 DNX12 (GAUZE/BANDAGES/DRESSINGS) ×3 IMPLANT
DRAPE C ARM PK CFD 31 SPINE (DRAPES) ×1 IMPLANT
DRAPE C-ARMOR (DRAPES) IMPLANT
DRAPE HD 5FT BACK TABLE (DRAPES) ×1 IMPLANT
DRAPE INCISE IOBAN 66X45 STRL (DRAPES) ×1 IMPLANT
DRAPE LAPAROTOMY 100X77 ABD (DRAPES) ×2 IMPLANT
DRAPE POUCH INSTRU U-SHP 10X18 (DRAPES) ×1 IMPLANT
DRAPE SCAN PATIENT (DRAPES) ×1 IMPLANT
DRAPE TABLE BACK 80X90 (DRAPES) ×1 IMPLANT
DRSG OPSITE POSTOP 4X6 (GAUZE/BANDAGES/DRESSINGS) IMPLANT
DRSG OPSITE POSTOP 4X8 (GAUZE/BANDAGES/DRESSINGS) IMPLANT
DRSG TEGADERM 2-3/8X2-3/4 SM (GAUZE/BANDAGES/DRESSINGS) IMPLANT
DRSG TEGADERM 4X4.75 (GAUZE/BANDAGES/DRESSINGS) IMPLANT
DRSG TEGADERM 6X8 (GAUZE/BANDAGES/DRESSINGS) IMPLANT
ELECT REM PT RETURN 9FT ADLT (ELECTROSURGICAL) ×2
ELECTRODE REM PT RTRN 9FT ADLT (ELECTROSURGICAL) ×2 IMPLANT
EX-PIN ORTHOLOCK NAV 4X150 (PIN) ×1 IMPLANT
FEE CVG SUPP BRAINLAB NG SPNE (MISCELLANEOUS) IMPLANT
FEE INTRAOP CADWELL SUPPLY NCS (MISCELLANEOUS) ×1 IMPLANT
FEE INTRAOP MONITOR IMPULS NCS (MISCELLANEOUS) ×1 IMPLANT
FORCEPS BPLR BAYO 10IN 1.0TIP (ORTHOPEDIC DISPOSABLE SUPPLIES) IMPLANT
GAUZE 4X4 16PLY ~~LOC~~+RFID DBL (SPONGE) IMPLANT
GAUZE SPONGE 2X2 STRL 8-PLY (GAUZE/BANDAGES/DRESSINGS) IMPLANT
GLOVE BIOGEL PI IND STRL 6.5 (GLOVE) ×3 IMPLANT
GLOVE SURG SYN 6.5 ES PF (GLOVE) ×5 IMPLANT
GLOVE SURG SYN 6.5 PF PI (GLOVE) ×5 IMPLANT
GLOVE SURG SYN 8.5 E (GLOVE) ×6 IMPLANT
GLOVE SURG SYN 8.5 PF PI (GLOVE) ×6 IMPLANT
GOWN SRG LRG LVL 4 IMPRV REINF (GOWNS) ×2 IMPLANT
GOWN SRG XL LVL 3 NONREINFORCE (GOWNS) ×2 IMPLANT
GOWN STRL NON-REIN TWL XL LVL3 (GOWNS) ×2
GOWN STRL REIN LRG LVL4 (GOWNS) ×2
GUIDEWIRE NITINOL BEVEL TIP (WIRE) IMPLANT
HOLDER FOLEY CATH W/STRAP (MISCELLANEOUS) ×1 IMPLANT
INTRAOP CADWELL SUPPLY FEE NCS (MISCELLANEOUS)
INTRAOP MONITOR FEE IMPULS NCS (MISCELLANEOUS)
JET LAVAGE IRRISEPT WOUND (IRRIGATION / IRRIGATOR) ×1
KIT DILATOR XLIF 5 (KITS) IMPLANT
KIT DISP MARS 3V (KITS) IMPLANT
KIT SPINAL PRONEVIEW (KITS) ×1 IMPLANT
KIT TURNOVER KIT A (KITS) ×1 IMPLANT
KNIFE BAYONET SHORT DISCETOMY (MISCELLANEOUS) IMPLANT
LAVAGE JET IRRISEPT WOUND (IRRIGATION / IRRIGATOR) ×1 IMPLANT
MANIFOLD NEPTUNE II (INSTRUMENTS) ×2 IMPLANT
MARKER SKIN DUAL TIP RULER LAB (MISCELLANEOUS) ×2 IMPLANT
MARKER SPHERE PSV REFLC 13MM (MARKER) ×7 IMPLANT
MODULE NVM5 NEXT GEN EMG (NEUROSURGERY SUPPLIES) IMPLANT
NDL SAFETY ECLIPSE 18X1.5 (NEEDLE) ×1 IMPLANT
NS IRRIG 500ML POUR BTL (IV SOLUTION) IMPLANT
PACK LAMINECTOMY ARMC (PACKS) ×1 IMPLANT
PAD ARMBOARD 7.5X6 YLW CONV (MISCELLANEOUS) ×1 IMPLANT
PENCIL SMOKE EVACUATOR (MISCELLANEOUS) ×1 IMPLANT
ROD RELINE MAS TI LORD 5.5X40 (Rod) IMPLANT
SCREW LOCK RELINE 5.5 TULIP (Screw) IMPLANT
SCREW RED RELINE 7.5X45MM POLY (Screw) IMPLANT
SCREW RELINE RED 6.5X45MM POLY (Screw) IMPLANT
SPACER HEDRON 10D 18X45X13 (Spacer) IMPLANT
STAPLER SKIN PROX 35W (STAPLE) IMPLANT
SURGIFLO W/THROMBIN 8M KIT (HEMOSTASIS) ×1 IMPLANT
SUT ETHILON 3-0 FS-10 30 BLK (SUTURE)
SUT STRATA 3-0 15 PS-2 (SUTURE) ×1 IMPLANT
SUT VIC AB 0 CT1 27 (SUTURE) ×3
SUT VIC AB 0 CT1 27XCR 8 STRN (SUTURE) ×1 IMPLANT
SUT VIC AB 2-0 CT1 18 (SUTURE) ×1 IMPLANT
SUTURE EHLN 3-0 FS-10 30 BLK (SUTURE) IMPLANT
SYR 30ML LL (SYRINGE) ×2 IMPLANT
TAPE CLOTH 3X10 WHT NS LF (GAUZE/BANDAGES/DRESSINGS) ×4 IMPLANT
TOWEL OR 17X26 4PK STRL BLUE (TOWEL DISPOSABLE) ×2 IMPLANT
TRAP FLUID SMOKE EVACUATOR (MISCELLANEOUS) ×1 IMPLANT
TRAY FOLEY SLVR 16FR LF STAT (SET/KITS/TRAYS/PACK) IMPLANT
TUBING CONNECTING 10 (TUBING) ×1 IMPLANT
WATER STERILE IRR 1000ML POUR (IV SOLUTION) ×2 IMPLANT
WATER STERILE IRR 500ML POUR (IV SOLUTION) IMPLANT

## 2023-06-18 NOTE — Progress Notes (Signed)
Patient became diaphoretic and very nauseated. Heart rate was sinus brady 37 BPM at the time of episode. Sat patient up and elevated HOB. Provided emesis bag. Called Dr. Joelene Millin and droperidol order was given verbally. At time time heart rate had resolved and patient was 76 BPM NSR. Will continue to monitor.

## 2023-06-18 NOTE — Addendum Note (Signed)
Addendum  created 06/18/23 1353 by Reece Agar, CRNA   Flowsheet accepted, Intraprocedure Meds edited

## 2023-06-18 NOTE — Anesthesia Preprocedure Evaluation (Addendum)
Anesthesia Evaluation  Patient identified by MRN, date of birth, ID band Patient awake    Reviewed: Allergy & Precautions, NPO status , Patient's Chart, lab work & pertinent test results  History of Anesthesia Complications (+) PONV and history of anesthetic complications  Airway Mallampati: III  TM Distance: >3 FB Neck ROM: full    Dental  (+) Chipped   Pulmonary shortness of breath and with exertion, asthma , sleep apnea , COPD,  COPD inhaler   Pulmonary exam normal        Cardiovascular hypertension, (-) angina + CAD and +CHF  + dysrhythmias Supra Ventricular Tachycardia   EKG 06/2023 Sinus bradycardia with 1st degree A-V block Right bundle branch block   Neuro/Psych  Headaches PSYCHIATRIC DISORDERS Anxiety Depression     Neuromuscular disease CVA (memory loss)    GI/Hepatic Neg liver ROS, hiatal hernia,GERD  ,,  Endo/Other  diabetesHypothyroidism    Renal/GU      Musculoskeletal   Abdominal   Peds  Hematology negative hematology ROS (+)   Anesthesia Other Findings Past Medical History: No date: (HFpEF) heart failure with preserved ejection fraction (HCC)     Comment:  a.) TTE 09/22/2014: EF >55%, no RWMAs, mild MAC, normal               RVSF, mild TR/PR, mod MR; b.) TTE 08/04/2019: EF >55%, no              RWMAs, normal RVSF, triv MR/TR; c.) TTE 06/14/2021: EF               60-65%, no RMWAs, normal RVSF, triv MR No date: Anginal pain (HCC) No date: Anxiety No date: Aortic atherosclerosis (HCC) No date: Arthritis No date: Asthma No date: Beta-blockers contraindicated     Comment:  a.) significant bradycardia in the past while on therapy No date: Bilateral lower extremity edema 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 No date: Chronic bilateral low back pain with bilateral sciatica No date: Complication of anesthesia     Comment:  a.) PONV; b.) delayed emergence when she was younger No date: COPD (chronic  obstructive pulmonary disease) (HCC) No date: Coronary artery disease     Comment:  a.) cCTA 11/01/2021: Ca+ score 174 (67th percentile for               age/sex match) --> FFR demonstrated no hemodynamically               significant stenosis No date: Depression No date: Diverticulosis No date: DOE (dyspnea on exertion) No date: Dyspnea No date: GERD (gastroesophageal reflux disease)     Comment:  a.) no daily Tx; s/p Nissen fundoplication No date: Gout No date: Heart murmur 05/12/2019: History of 2019 novel coronavirus disease (COVID-19) No date: History of hiatal hernia     Comment:  a.) s/p Nissen fundoplication No date: History of kidney stones No date: History of orthopnea No date: HLD (hyperlipidemia) No date: Hypertension No date: Hypothyroidism 2024: Lumbar stenosis No date: Memory loss     Comment:  a.) s/p "mini stroke" in 2021; b.) on               acetylcholinesterase inhibitor (donepazil) No date: Migraines No date: Mobitz type I Wenckebach atrioventricular block     Comment:  a.) holter study 01/27/2022 No date: On apixaban therapy No date: PAF (paroxysmal atrial fibrillation) (HCC)     Comment:  a.) CHA2DS2-VASc = 50 (age x2, sex, HFpEF, CVA x2, HTN,  vascular disease history, T2DM) as of 06/14/2023;  b.)               rate/rhythm maintained without pharmacological               intervention; chronically anticoagulated using standard               dose apixaban No date: Palpitations 05/2019: Pneumonia due to COVID-19 virus No date: PONV (postoperative nausea and vomiting) 01/27/2022: PSVT (paroxysmal supraventricular tachycardia) (HCC)     Comment:  a.) holter study 01/27/2022 - longest lasting 17 beats               at a rate of 109 bpm No date: Pulmonary HTN (HCC) No date: RBBB (right bundle branch block) No date: Scleroderma (HCC) No date: Sleep apnea     Comment:  a.) does not require nocturnal PAP therapy 2021: Stroke (HCC)     Comment:   a.) "mild" per patient; no findings on imaging;               presented with transient aphasia, memory loss, extremity               numbness No date: T2DM (type 2 diabetes mellitus) (HCC) No date: Transfusion of blood product refused for religious reason  (Jehovah's witness) No date: Vertigo No date: Vitamin D deficiency  Past Surgical History: No date: ABDOMINAL HYSTERECTOMY     Comment:  ovaries intact 03/03/2022: ANKLE ARTHROSCOPY; Left     Comment:  Procedure: ANKLE ARTHROSCOPY;  Surgeon: Edwin Cap, DPM;  Location: [facility];  Service: Podiatry;                Laterality: Left;  GENERAL WITH POP AND SAPHENOUS BLOCK No date: BLADDER SURGERY     Comment:  bladder tuck 03/20/2016: BREAST BIOPSY; Right     Comment:  neg x 2 area No date: CARDIAC CATHETERIZATION No date: CARPAL TUNNEL RELEASE; Bilateral 09/17/2018: CATARACT EXTRACTION W/PHACO; Left     Comment:  Procedure: CATARACT EXTRACTION PHACO AND INTRAOCULAR               LENS PLACEMENT (IOC) LEFT, DIABETIC;  Surgeon: Galen Manila, MD;  Location: [facility];  Service:               Ophthalmology;  Laterality: Left;  [place] 00:34 CDE               4.85 Fluid pack lot # [id number] 10/15/2018: CATARACT EXTRACTION W/PHACO; Right     Comment:  Procedure: CATARACT EXTRACTION PHACO AND INTRAOCULAR               LENS PLACEMENT (IOC)-RIGHT;  Surgeon: Galen Manila,               MD;  Location: [facility];  Service: Ophthalmology;                Laterality: Right;  [place] 00:27.6 CDE 3.43 Fluid Pack Lot               # [id number] 09/27/2018: COLONOSCOPY WITH PROPOFOL; N/A     Comment:  Procedure: COLONOSCOPY WITH PROPOFOL;  Surgeon:               Pasty Spillers, MD;  Location: [facility];  Service: Endoscopy;  Laterality: N/A; No date: DILATION AND CURETTAGE OF UTERUS No date: EYE SURGERY     Comment:  eyelid No date: FRACTURE SURGERY     Comment:  left ankle-plate and  screws palced 03/03/2022: GRAFT APPLICATION; Left     Comment:  Procedure: BONE GRAFT;  Surgeon: Edwin Cap, DPM;               Location: [facility];  Service: Podiatry;  Laterality:               Left; 03/03/2022: HARDWARE REMOVAL; Left     Comment:  Procedure: POSSIBLE HARDWARE REMOVAL;  Surgeon:               Edwin Cap, DPM;  Location: [facility];  Service:               Podiatry;  Laterality: Left; No date: LITHOTRIPSY No date: NISSEN FUNDOPLICATION No date: SHOULDER SURGERY; Right     Comment:  rotator cuff No date: TONSILLECTOMY  BMI    Body Mass Index: 33.84 kg/m      Reproductive/Obstetrics negative OB ROS                             Anesthesia Physical Anesthesia Plan  ASA: 3  Anesthesia Plan: General ETT   Post-op Pain Management: Toradol IV (intra-op)*, Ofirmev IV (intra-op)* and Dilaudid IV   Induction: Intravenous  PONV Risk Score and Plan: 3 and Ondansetron, Dexamethasone and Treatment may vary due to age or medical condition  Airway Management Planned: Oral ETT  Additional Equipment:   Intra-op Plan:   Post-operative Plan: Extubation in OR  Informed Consent: I have reviewed the patients History and Physical, chart, labs and discussed the procedure including the risks, benefits and alternatives for the proposed anesthesia with the patient or authorized representative who has indicated his/her understanding and acceptance.     Dental Advisory Given  Plan Discussed with: Anesthesiologist, CRNA and Surgeon  Anesthesia Plan Comments: (Patient consented for risks of anesthesia including but not limited to:  - adverse reactions to medications - damage to eyes, teeth, lips or other oral mucosa - nerve damage due to positioning  - sore throat or hoarseness - Damage to heart, brain, nerves, lungs, other parts of body or loss of life  Patient voiced understanding and assent.)        Anesthesia Quick Evaluation

## 2023-06-18 NOTE — Anesthesia Postprocedure Evaluation (Signed)
Anesthesia Post Note  Patient: [name]  Procedure(s) Performed: L4-5 LATERAL LUMBAR INTERBODY FUSION AND POSTERIOR SPINAL FUSION (Spine Lumbar) APPLICATION OF INTRAOPERATIVE CT SCAN (Spine Lumbar)  Patient location during evaluation: PACU Anesthesia Type: General Level of consciousness: awake and alert Pain management: pain level controlled Vital Signs Assessment: post-procedure vital signs reviewed and stable Respiratory status: spontaneous breathing, nonlabored ventilation, respiratory function stable and patient connected to nasal cannula oxygen Cardiovascular status: blood pressure returned to baseline and stable Postop Assessment: no apparent nausea or vomiting Anesthetic complications: no   No notable events documented.   Last Vitals:  Vitals:   06/18/23 1115 06/18/23 1130  BP: (!) 145/73 (!) 140/64  Pulse: 68 61  Resp: 16 12  Temp:    SpO2: 100% 96%    Last Pain:  Vitals:   06/18/23 1130  TempSrc:   PainSc: 10-Worst pain ever                 Louie Boston

## 2023-06-18 NOTE — Anesthesia Procedure Notes (Signed)
Procedure Name: Intubation Date/Time: 06/18/2023 7:29 AM  Performed by: Reece Agar, CRNAPre-anesthesia Checklist: Patient identified, Emergency Drugs available, Suction available and Patient being monitored Patient Re-evaluated:Patient Re-evaluated prior to induction Oxygen Delivery Method: Circle system utilized Preoxygenation: Pre-oxygenation with 100% oxygen Induction Type: IV induction Ventilation: Mask ventilation without difficulty Laryngoscope Size: McGraph and 3 Grade View: Grade I Tube type: Oral Tube size: 7.0 mm Number of attempts: 1 Airway Equipment and Method: Stylet Placement Confirmation: ETT inserted through vocal cords under direct vision, positive ETCO2 and breath sounds checked- equal and bilateral Secured at: 20 cm Tube secured with: Tape Dental Injury: Teeth and Oropharynx as per pre-operative assessment

## 2023-06-18 NOTE — Op Note (Signed)
Indications: [name] is a 77 y.o. 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   Preoperative Note:   Risks of surgery discussed include: infection, bleeding, stroke, coma, death, paralysis, CSF leak, nerve/spinal cord injury, numbness, tingling, weakness, complex regional pain syndrome, recurrent stenosis and/or disc herniation, vascular injury, development of instability, neck/back pain, need for further surgery, persistent symptoms, development of deformity, and the risks of anesthesia. The patient understood these risks and agreed to proceed.  NAME OF ANTERIOR PROCEDURE:               1. Anterior lumbar interbody fusion via a right lateral retroperitoneal approach at L4/5 2. Placement of a Lordotic  Globus Hedron interbody cage, filled with Demineralized Bone Matrix  NAME OF POSTERIOR PROCEDURE 1. Posterior instrumentation using Nuvasive Reline Instrumentation 2. Posterolateral fusion, L4/5, utilizing demineralized bone matrix 3. Use of Stereotaxis   PROCEDURE:  Patient was brought to the operating room, intubated, turned to the lateral position.  All pressure points were checked and double-checked.  The patient was prepped and draped in the standard fashion. Prior to prepping, fluoroscopy was brought in and the patient was positioned with a  large bump under the contralateral side between the iliac crest and rib cage, allowing the area between the iliac crest and the lateral aspect of the rib cage to open and increase the ability to reach inferiorly, to facilitate entry into the disc space.  The incision was marked upon the skin both the location of the disc space as well as the superior most aspect of the iliac crest.  Based on the identification of the disc space an incision was prepared, marked upon the skin and eventually was used for our lateral incision.  The fluoroscopy was turned into a cross table A/P image in order to confirm that the patient's spine remained in a perpendicular trajectory to the floor without rotation.  Once confirming that all the pressure points were checked and double-checked and the patient remained in sturdy position strapped down in this slightly jack-knifed lateral position, the patient was prepped and draped in standard fashion.  The skin was injected with local anesthetic, then incised until the abdominal wall fascia was noted.  I bluntly dissected posteriorly until we were able to identify the posterior musculature near petit's triangle.  At this point, using primarily blunt dissection with our finger aided with a metzenbaum scissor, were able to enter the retroperitoneal cavity.  The retroperitoneal potential space was opened further until palpating out the psoas muscle, the medial aspect of the iliac crest, the medial aspect of the last rib and continued to define the retroperitoneal space with blunt dissection in order to facilitate safe placement of our dilators.    While protecting by dissecting directly onto a finger in the retroperitoneum, the retroperitoneal space was entered safely from the lateral incision and the initial dilator placed onto the muscle belly of the psoas.  While  directly stimulating the dilator and after radiographically confirming our location relative to the disc space, I placed the  dilator through the psoas.  The dilators were stimulated to ensure remaining safely away from any of the lumbar plexus nerves; the dilators were repositioned until no pathologic stimulation was appreciated.  Once I had confirmed the location of our initial dilator radiographically, a K-wire secured the dilator into the L4/5 disc space and confirmed position under A/P and lateral fluoroscopy.  At this point, I dilated up with direct stimulation to confirm lack of pathologic stimulation.  Once all the dilators were in position, I placed in the retractor and secured it onto the table, locked into position and confirmed under A/P and lateral fluoroscopy to confirm our approach angle to the disc space as well as location relative to the disc space.  I then placed the muscle stimulator in through the working channel down to the vertebral body, stimulating the entire lateral surface of the vertebral body and any of the visualized psoas muscle that was adjacent to the retractor, confirming again the safe passage to the psoas before we began performing the discectomy.  At this point, we began our discectomy at L4/5.  The disc was incised laterally throughout the extent of our exposure. Using a combination of pituitary rongeurs, Kerrison rongeurs, rasps, curettes of various sorts, we were able to begin to clean out the disc space.  Once we had cleaned out the majority of the disc space, we then cut the lateral annulus with a cob, breaking the lateral annual attachments on the contralateral side by subtly working the cob through the annulus while using flouroscopy.  Care was taken not to extend further than required after cutting the annular attachments.  After this had been performed, we prepared the endplates for placement of our graft, sized a graft to the disc space by serially dilating up in trial sizes until we confirmed that our graft would be well positioned, allowing distraction while maintaining good grip.  This  was confirmed under A/P and lateral fluoroscopy in order to ensure its placement as an eventual trial for placement of our final graft.  We irrigated with saline.  Once confirmed placement, the Hedron implant filled with allograft was impacted into position at L4/5.   Through a combination of intradiscal distraction and anterior releasing, we were able to correct the anterior deformity during disc preparation and placement of the graft.  At this point, final radiographs were performed, and we began closure.  The wound was closed using 0 Vicryl interrupted suture in the fascia and 2-0 Vicryl inverted suture were placed in the subcutaneous tissue and dermis. 3-0 monocryl was used for final closure. Dermabond was used to close the skin.    After closing the anterior part in layers, the patient was repositioned into prone position.  All pressure points were checked and double-checked.  The posterior operative site was prepped and draped in standard fashion.  The stereotactic array was placed.  Stereotactic images were acquired using intraoperative CT scanning.  This was registered to the patient.  Using stereotaxis, screw trajectories were planned and incisions made.  The L4 pedicles were cannulated bilaterally and K wires used to secure the tracks.   The prior instrumentation was then carefully removed.  The right S1 screw was not removable due to its positioning within the bone.   We then utilized a stereotactic screwdriver to place pedicle screws from L4 to L5.  At each level, Nuvasive Reline pedicle  screws were placed.  Once the screws were placed, the screw extensions were then linked, a path was formed for the rod and a rod was utilized to connect the screws.  We then compressed, torqued / counter-torqued and removed the screw assembly. Once performed on each side, the C-arm was brought back and to take confirmatory CT scan showing appropriate placement of all instrumentation and anatomic  alignment.    Posterolateral arthrodesis was performed at L4-L5 utilizing demineralized bone matrix.  We irrigated each incision and obtained hemostasis. Each wound was closed using 0 Vicryl interrupted suture in the fascia, 2-0 Vicryl inverted suture were placed in the subcutaneous tissue and dermis. 3-0 monocryl was used for final closure. Dermabond was used to close the skin.    Needle, lap and all counts were correct at the end of the case.     Manning Charity PA assisted in the entire procedure. An assistant was required for this procedure due to the complexity.  The assistant provided assistance in tissue manipulation and suction, and was required for the successful and safe performance of the procedure. I performed the critical portions of the procedure.   Venetia Night MD Neurosurgery

## 2023-06-18 NOTE — H&P (Signed)
Referring Physician:  Venetia Night, MD [address] [place],  [place] [address]  Primary Physician:  Bosie Clos, MD  History of Present Illness: 06/18/2023 Ms. Merritts presents with continued symptoms.  05/08/2023 Ms. Paislei Gustine is here today with a chief complaint of low back pain that will radiate into the bilateral buttocks and down into the legs. The pain in the right leg goes down into the toes and the pain in the left leg will stop at the knee. She also has numbness and tingling in the legs.  She has been suffering from this pain for approximately 1 year.  Standing, walking, bending make it worse.  She has pain almost immediately when she stands.  The pain, initially severe on the left, has since migrated to the right and has persisted. The patient reports difficulty with ambulation, only able to walk approximately 50 feet before needing to stop due to pain. The pain is immediate upon standing and is described as a 'roaring' sensation that originates in the gluteal region and radiates down to the toes. The patient also reports weakness, leading to frequent falls, and numbness and tingling in the hands, attributed to their known neuropathy.  The patient also experiences pain when standing in one position for extended periods. Despite undergoing physical therapy, the patient did not experience significant relief. The patient's previous surgery reportedly provided relief, and the patient has two known bulging discs in the lower back. The patient describes a sensation of the discs 'rubbing together' during movement.  She has weakness in her right leg.  The patient's diabetes management has been challenging, with recent steroid injections causing a significant spike in blood sugar levels. However, the patient reports recent improvements in blood sugar control and weight loss, attributed to the medication Ozempic. The patient also has a history of atrial  fibrillation and is on anticoagulation therapy.  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 The patient is also a Jehovah's Witness and has specific considerations regarding blood products and transfusions.   Bowel/Bladder Dysfunction: none  Conservative measures:  Physical therapy: Has participated in at [facility] from 04/17/23 to 04/27/23 Multimodal medical therapy including regular antiinflammatories: Tylenol, Tramadol, Gabapentin Injections: has received epidural steroid injections 03/23/2023 Bilateral L4-5 TF ESI  Past Surgery:  L5-S1 fusion in 2006 by Dr. Charise Carwin has no symptoms of cervical myelopathy.  The symptoms are causing a significant impact on the patient's life.   I have utilized the care everywhere function in epic to review the outside records available from external health systems.  Review of Systems:  A 10 point review of systems is negative, except for the pertinent positives and negatives detailed in the HPI.  Past Medical History: Past Medical History:  Diagnosis Date   (HFpEF) heart failure with preserved ejection fraction (HCC)    a.) TTE 09/22/2014: EF >55%, no RWMAs, mild MAC, normal RVSF, mild TR/PR, mod MR; b.) TTE 08/04/2019: EF >55%, no RWMAs, normal RVSF, triv MR/TR; c.) TTE 06/14/2021: EF 60-65%, no RMWAs, normal RVSF, triv MR   Anginal pain (HCC)    Anxiety    Aortic atherosclerosis (HCC)    Arthritis    Asthma    Beta-blockers contraindicated    a.) significant bradycardia in the past while on therapy   Bilateral lower extremity edema    Cerebral microvascular disease  Chronic bilateral low back pain with bilateral sciatica    Complication of anesthesia    a.) PONV; b.) delayed emergence when she was younger   COPD (chronic obstructive pulmonary disease) (HCC)    Coronary artery disease    a.) cCTA 11/01/2021: Ca+  score 174 (67th percentile for age/sex match) --> FFR demonstrated no hemodynamically significant stenosis   Depression    Diverticulosis    DOE (dyspnea on exertion)    Dyspnea    GERD (gastroesophageal reflux disease)    a.) no daily Tx; s/p Nissen fundoplication   Gout    Heart murmur    History of 2019 novel coronavirus disease (COVID-19) 05/12/2019   History of hiatal hernia    a.) s/p Nissen fundoplication   History of kidney stones    History of orthopnea    HLD (hyperlipidemia)    Hypertension    Hypothyroidism    Lumbar stenosis 2024   Memory loss    a.) s/p "mini stroke" in 2021; b.) on acetylcholinesterase inhibitor (donepazil)   Migraines    Mobitz type I Wenckebach atrioventricular block    a.) holter study 01/27/2022   On apixaban therapy    PAF (paroxysmal atrial fibrillation) (HCC)    a.) CHA2DS2-VASc = 38 (age x2, sex, HFpEF, CVA x2, HTN, vascular disease history, T2DM) as of 06/14/2023;  b.) rate/rhythm maintained without pharmacological intervention; chronically anticoagulated using standard dose apixaban   Palpitations    Pneumonia due to COVID-19 virus 05/2019   PONV (postoperative nausea and vomiting)    PSVT (paroxysmal supraventricular tachycardia) (HCC) 01/27/2022   a.) holter study 01/27/2022 - longest lasting 17 beats at a rate of 109 bpm   Pulmonary HTN (HCC)    RBBB (right bundle branch block)    Scleroderma (HCC)    Sleep apnea    a.) does not require nocturnal PAP therapy   Stroke (HCC) 2021   a.) "mild" per patient; no findings on imaging; presented with transient aphasia, memory loss, extremity numbness   T2DM (type 2 diabetes mellitus) (HCC)    Transfusion of blood product refused for religious reason (Jehovah's witness)    Vertigo    Vitamin D deficiency     Past Surgical History: Past Surgical History:  Procedure Laterality Date   ABDOMINAL HYSTERECTOMY     ovaries intact   ANKLE ARTHROSCOPY Left 03/03/2022   Procedure: ANKLE  ARTHROSCOPY;  Surgeon: Edwin Cap, DPM;  Location: [facility];  Service: Podiatry;  Laterality: Left;  GENERAL WITH POP AND SAPHENOUS BLOCK   BLADDER SURGERY     bladder tuck   BREAST BIOPSY Right 03/20/2016   neg x 2 area   CARDIAC CATHETERIZATION     CARPAL TUNNEL RELEASE Bilateral    CATARACT EXTRACTION W/PHACO Left 09/17/2018   Procedure: CATARACT EXTRACTION PHACO AND INTRAOCULAR LENS PLACEMENT (IOC) LEFT, DIABETIC;  Surgeon: Galen Manila, MD;  Location: [facility];  Service: Ophthalmology;  Laterality: Left;  [place] 00:34 CDE 4.85 Fluid pack lot # [id number]   CATARACT EXTRACTION W/PHACO Right 10/15/2018   Procedure: CATARACT EXTRACTION PHACO AND INTRAOCULAR LENS PLACEMENT (IOC)-RIGHT;  Surgeon: Galen Manila, MD;  Location: [facility];  Service: Ophthalmology;  Laterality: Right;  [place] 00:27.6 CDE 3.43 Fluid Pack Lot # [id number]   COLONOSCOPY WITH PROPOFOL N/A 09/27/2018   Procedure: COLONOSCOPY WITH PROPOFOL;  Surgeon: Pasty Spillers, MD;  Location: [facility];  Service: Endoscopy;  Laterality: N/A;   DILATION AND CURETTAGE OF UTERUS     EYE SURGERY  eyelid   FRACTURE SURGERY     left ankle-plate and screws palced   GRAFT APPLICATION Left 03/03/2022   Procedure: BONE GRAFT;  Surgeon: Edwin Cap, DPM;  Location: [facility];  Service: Podiatry;  Laterality: Left;   HARDWARE REMOVAL Left 03/03/2022   Procedure: POSSIBLE HARDWARE REMOVAL;  Surgeon: Edwin Cap, DPM;  Location: [facility];  Service: Podiatry;  Laterality: Left;   LITHOTRIPSY     NISSEN FUNDOPLICATION     SHOULDER SURGERY Right    rotator cuff   TONSILLECTOMY      Allergies: Allergies as of 05/25/2023 - Review Complete 05/08/2023  Allergen Reaction Noted   Clarithromycin Nausea And Vomiting and Other (See Comments)    Codeine  12/01/2011   Dilaudid  [hydromorphone hcl] Nausea And Vomiting 06/28/2015   Iodine Hives and Itching 06/28/2015   Iohexol Hives and Itching 03/05/2007   Onion  Diarrhea and Nausea And Vomiting 10/26/2021   Povidone-iodine Itching 03/19/2023   Tamiflu  [oseltamivir phosphate] Nausea And Vomiting 06/28/2015   Zolpidem Nausea And Vomiting and Other (See Comments) 07/26/2015   Bacitracin-neomycin-polymyxin Itching and Rash 06/28/2015   Benzalkonium chloride Itching, Rash, and Swelling 07/26/2015   Lidocaine hcl Itching, Rash, and Swelling 06/28/2015   Morphine Nausea And Vomiting and Rash 12/01/2011   Neomycin-bacitracin zn-polymyx Itching and Rash    Tape Itching and Rash 07/12/2016   Tapentadol Rash 07/12/2016    Medications:  Current Facility-Administered Medications:    0.9 %  sodium chloride infusion, , Intravenous, Continuous, Piscitello, Cleda Mccreedy, MD   ceFAZolin (ANCEF) IVPB 2g/100 mL premix, 2 g, Intravenous, 60 min Pre-Op, Venetia Night, MD   vancomycin (VANCOCIN) IVPB 1000 mg/200 mL premix, 1,000 mg, Intravenous, Once, Venetia Night, MD  Social History: Social History   Tobacco Use   Smoking status: Never   Smokeless tobacco: Never  Vaping Use   Vaping status: Never Used  Substance Use Topics   Alcohol use: Never   Drug use: Never    Family Medical History: Family History  Problem Relation Age of Onset   Stroke Mother    Hypertension Mother    Heart disease Mother    Arthritis Mother    Heart disease Father    Hypertension Father    Diabetes Sister    Hypertension Sister    Asthma Sister    Hypertension Sister    Diverticulitis Sister    Colon cancer Maternal Grandmother    Breast cancer Maternal Grandmother    Breast cancer Maternal Aunt     Physical Examination: Vitals:   06/18/23 0632  BP: (!) 160/72  Pulse: 63  Resp: 20  Temp: 98 F (36.7 C)  SpO2: 96%    General: Patient is in no apparent distress. Attention to examination is appropriate.  Neck:   Supple.  Full range of motion.  Respiratory: Patient is breathing without any difficulty.  Heart sounds normal no MRG. Chest Clear to  Auscultation Bilaterally.   NEUROLOGICAL:     Awake, alert, oriented to person, place, and time.  Speech is clear and fluent.   Cranial Nerves: Pupils equal round and reactive to light.  Facial tone is symmetric.  Facial sensation is symmetric. Shoulder shrug is symmetric. Tongue protrusion is midline.  There is no pronator drift.  Strength: Side Biceps Triceps Deltoid Interossei Grip Wrist Ext. Wrist Flex.  R 5 4+ 5 5 5 5 5   L 5 5 5 5 5 5 5    Side Iliopsoas Quads Hamstring PF DF  EHL  R 5 4- 5 5 5 5   L 5 4 5 5 5 5    Reflexes are 1+ and symmetric at the biceps, triceps, brachioradialis, patella and achilles.   Hoffman's is absent.   Bilateral upper and lower extremity sensation is intact to light touch.    No evidence of dysmetria noted.  Gait is untested.     Medical Decision Making  Imaging: MRI L spine 02/28/2023 IMPRESSION: 1. Prior posterior and interbody fusion at L5-S1 with solid arthrodesis. No foraminal or canal stenosis at this level. 2. 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 3. Mild bilateral foraminal and subarticular recess stenosis at L3-4, also progressed.     Electronically Signed   By: Duanne Guess D.O.   On: 02/28/2023 13:00  I have personally reviewed the images and agree with the above interpretation.  Assessment and Plan: Ms. Gerrity is a pleasant 77 y.o. 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  She has objective weakness.  Additionally, she has tried and failed physical therapy.  We will proceed with L4-5 lateral lumbar interbody fusion with removal of her prior L5 and S1 screws with placement of new L4 and L5 screws.  This will contribute to posterior fusion at L4-5 as well.  She is a TEFL teacher Witness, so we will not  consider use of any blood products.      Lorra Freeman K. Myer Haff MD, [facility] Neurosurgery

## 2023-06-18 NOTE — Plan of Care (Signed)
  Problem: Clinical Measurements: Goal: Ability to maintain clinical measurements within normal limits will improve Outcome: Progressing   Problem: Activity: Goal: Risk for activity intolerance will decrease Outcome: Progressing   Problem: Education: Goal: Knowledge of General Education information will improve Description: Including pain rating scale, medication(s)/side effects and non-pharmacologic comfort measures Outcome: Progressing

## 2023-06-18 NOTE — Addendum Note (Signed)
Addendum  created 06/18/23 1154 by Reece Agar, CRNA   Attestation recorded in Intraprocedure, Intraprocedure Attestations filed

## 2023-06-18 NOTE — Transfer of Care (Signed)
Immediate Anesthesia Transfer of Care Note  Patient: Teresa [name]  Procedure(s) Performed: L4-5 LATERAL LUMBAR INTERBODY FUSION AND POSTERIOR SPINAL FUSION (Spine Lumbar) APPLICATION OF INTRAOPERATIVE CT SCAN (Spine Lumbar)  Patient Location: PACU  Anesthesia Type:General  Level of Consciousness: awake, alert , oriented, and drowsy  Airway & Oxygen Therapy: Patient Spontanous Breathing and Patient connected to face mask oxygen  Post-op Assessment: Report given to RN and Post -op Vital signs reviewed and stable  Post vital signs: Reviewed and stable  Last Vitals:  Vitals Value Taken Time  BP    Temp    Pulse 76 06/18/23 1057  Resp 17 06/18/23 1057  SpO2 100 % 06/18/23 1057    Last Pain:  Vitals:   06/18/23 9562  TempSrc: Oral  PainSc: 10-Worst pain ever         Complications: No notable events documented.

## 2023-06-19 ENCOUNTER — Encounter: Payer: Self-pay | Admitting: Neurosurgery

## 2023-06-19 LAB — GLUCOSE, CAPILLARY
Glucose-Capillary: 184 mg/dL — ABNORMAL HIGH (ref 70–99)
Glucose-Capillary: 247 mg/dL — ABNORMAL HIGH (ref 70–99)
Glucose-Capillary: 274 mg/dL — ABNORMAL HIGH (ref 70–99)
Glucose-Capillary: 211 mg/dL — ABNORMAL HIGH (ref 70–99)

## 2023-06-19 MED ORDER — DEXAMETHASONE SODIUM PHOSPHATE 4 MG/ML IJ SOLN
4.0000 mg | Freq: Once | INTRAMUSCULAR | Status: AC
  Administered 2023-06-19: 4 mg via INTRAVENOUS
  Filled 2023-06-19: qty 1

## 2023-06-19 NOTE — Evaluation (Signed)
Occupational Therapy Evaluation Patient Details Name: Teresa [name] MRN: [id number] DOB: 05/04/1946 Today's Date: 06/19/2023   History of Present Illness Teresa [name] is a pleasant 77 y.o. 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 S/p L4-5 lateral lumbar interbody fusion on 06/18/23.   Clinical Impression   [name] was seen for OT evaluation this date. Prior to hospital admission, pt was MOD I using AD as needed, reports significant daily falls hx. Pt lives alone in townhouse with bed/bath up 13 stairs. Pt currently requires MIN A bed mobility, educated on log roll technique. Pt reports 8/10 RLE and glute pain persistent t/o session. MAX A don B socks in sitting. MOD A + RW sit<>stand and CGA bed>chair step pivot t/f. Pt would benefit from skilled OT to address noted impairments and functional limitations (see below for any additional details). Upon hospital discharge, recommend OT follow up.    If plan is discharge home, recommend the following: A lot of help with walking and/or transfers;A lot of help with bathing/dressing/bathroom;Help with stairs or ramp for entrance    Functional Status Assessment  Patient has had a recent decline in their functional status and demonstrates the ability to make significant improvements in function in a reasonable and predictable amount of time.  Equipment Recommendations  BSC/3in1    Recommendations for Other Services       Precautions / Restrictions Precautions Precautions: Back;Fall Restrictions Weight Bearing Restrictions: No      Mobility Bed Mobility Overal bed mobility: Needs Assistance Bed Mobility: Rolling, Sidelying to Sit Rolling: Min assist Sidelying to sit: Min assist            Transfers Overall transfer level: Needs assistance Equipment used: Rolling walker (2 wheels) Transfers: Sit to/from  Stand, Bed to chair/wheelchair/BSC Sit to Stand: Mod assist     Step pivot transfers: Contact guard assist            Balance Overall balance assessment: Needs assistance Sitting-balance support: Feet supported, Bilateral upper extremity supported Sitting balance-Leahy Scale: Fair     Standing balance support: Bilateral upper extremity supported Standing balance-Leahy Scale: Poor                             ADL either performed or assessed with clinical judgement   ADL Overall ADL's : Needs assistance/impaired                                       General ADL Comments: MAX A don B socks in sitting. MOD A for simulated BSC t/f      Pertinent Vitals/Pain Pain Assessment Pain Assessment: 0-10 Pain Score: 8  Pain Location: B glutes Pain Descriptors / Indicators: Grimacing, Guarding, Radiating, Shooting, Sharp Pain Intervention(s): Limited activity within patient's tolerance, Premedicated before session, Repositioned     Extremity/Trunk Assessment Upper Extremity Assessment Upper Extremity Assessment: Overall WFL for tasks assessed   Lower Extremity Assessment Lower Extremity Assessment: Generalized weakness       Communication Communication Communication: No apparent difficulties   Cognition Arousal: Alert Behavior During Therapy: WFL for tasks assessed/performed Overall Cognitive Status: Within Functional Limits for tasks assessed  Home Living Family/patient expects to be discharged to:: Private residence Living Arrangements: Alone Available Help at Discharge: Family;Friend(s);Available PRN/intermittently Type of Home: House Home Access: Stairs to enter Entrance Stairs-Number of Steps: 1+1   Home Layout: Two level;Bed/bath upstairs;1/2 bath on main level Alternate Level Stairs-Number of Steps: 13 Alternate Level Stairs-Rails: Right           Home  Equipment: Rollator (4 wheels);Cane - single point          Prior Functioning/Environment Prior Level of Function : Independent/Modified Independent;History of Falls (last six months)             Mobility Comments: reports daily falls and use of rollator or SPC as needed          OT Problem List: Decreased strength;Decreased range of motion;Decreased activity tolerance;Impaired balance (sitting and/or standing);Decreased safety awareness      OT Treatment/Interventions: Self-care/ADL training;Therapeutic exercise;Energy conservation;DME and/or AE instruction;Therapeutic activities;Patient/family education;Balance training    OT Goals(Current goals can be found in the care plan section) Acute Rehab OT Goals Patient Stated Goal: to go to rehab OT Goal Formulation: With patient Time For Goal Achievement: 07/03/23 Potential to Achieve Goals: Good ADL Goals Pt Will Perform Grooming: with modified independence;standing Pt Will Perform Lower Body Dressing: with contact guard assist;with adaptive equipment;sit to/from stand Pt Will Transfer to Toilet: with modified independence;ambulating;regular height toilet Pt Will Perform Toileting - Clothing Manipulation and hygiene: with modified independence;sitting/lateral leans  OT Frequency: Min 1X/week    Co-evaluation              AM-PAC OT "6 Clicks" Daily Activity     Outcome Measure Help from another person eating meals?: None Help from another person taking care of personal grooming?: A Little Help from another person toileting, which includes using toliet, bedpan, or urinal?: A Lot Help from another person bathing (including washing, rinsing, drying)?: A Lot Help from another person to put on and taking off regular upper body clothing?: None Help from another person to put on and taking off regular lower body clothing?: A Lot 6 Click Score: 17   End of Session Equipment Utilized During Treatment: Rolling walker (2  wheels) Nurse Communication: Mobility status  Activity Tolerance: Patient tolerated treatment well;Patient limited by pain Patient left: in chair;with call bell/phone within reach;with nursing/sitter in room  OT Visit Diagnosis: Other abnormalities of gait and mobility (R26.89);Muscle weakness (generalized) (M62.81)                Time: 8469-6295 OT Time Calculation (min): 25 min Charges:  OT General Charges $OT Visit: 1 Visit OT Evaluation $OT Eval Moderate Complexity: 1 Mod OT Treatments $Self Care/Home Management : 8-22 mins  Kathie Dike, M.S. OTR/L  06/19/23, 10:01 AM  ascom [phone]

## 2023-06-19 NOTE — Progress Notes (Signed)
   Neurosurgery Progress Note  History: Teresa [name] is s/p L4-5 XLIF and PSD  POD1: Pt complaining of ongoing right buttock pain and soreness in right anterior thigh and groin   Physical Exam: Vitals:   06/19/23 0027 06/19/23 0433  BP: 107/68 106/60  Pulse: 75 71  Resp: 20 20  Temp: (!) 97.5 F (36.4 C) 98.1 F (36.7 C)  SpO2: 93% 93%    AA Ox3 CNI  Strength:5/5 throughout BLE except 3/5 right HF and KE  Incisions c/d/I with post-op dressings in place    Assessment/Plan:  Teresa [name] is a 77 y.o presenting with lumbar stenosis and adjacent segment disease s/p L4-5 XLIF and PSF.   - mobilize - pain control - DVT prophylaxis - continue to monitor elevated blood sugars  - PTOT  Manning Charity PA-C Department of Neurosurgery

## 2023-06-19 NOTE — Discharge Instructions (Signed)
  Your surgeon has performed an operation on your lumbar spine (low back) to relieve pressure on one or more nerves. Many times, patients feel better immediately after surgery and can "overdo it." Even if you feel well, it is important that you follow these activity guidelines. If you do not let your back heal properly from the surgery, you can increase the chance of hardware complications and/or return of your symptoms. The following are instructions to help in your recovery once you have been discharged from the hospital.  Do not use NSAIDs for 3 months after surgery.   Activity    No bending, lifting, or twisting ("BLT"). Avoid lifting objects heavier than 10 pounds (gallon milk jug).  Where possible, avoid household activities that involve lifting, bending, pushing, or pulling such as laundry, vacuuming, grocery shopping, and childcare. Try to arrange for help from friends and family for these activities while your back heals.  Increase physical activity slowly as tolerated.  Taking short walks is encouraged, but avoid strenuous exercise. Do not jog, run, bicycle, lift weights, or participate in any other exercises unless specifically allowed by your doctor. Avoid prolonged sitting, including car rides.  Talk to your doctor before resuming sexual activity.  You should not drive until cleared by your doctor.  Until released by your doctor, you should not return to work or school.  You should rest at home and let your body heal.   You may shower three days after your surgery.  After showering, lightly dab your incision dry. Do not take a tub bath or go swimming for 3 weeks, or until approved by your doctor at your follow-up appointment.  If you smoke, we strongly recommend that you quit.  Smoking has been proven to interfere with normal healing in your back and will dramatically reduce the success rate of your surgery. Please contact [facility] (800-QUIT-NOW) and use the resources at  www.[facility].com for assistance in stopping smoking.  Surgical Incision   If you have a dressing on your incision, you may remove it three days after your surgery. Keep your incision area clean and dry.  Your incision was closed with Dermabond glue. The glue should begin to peel away within about a week.  Diet            You may return to your usual diet. Be sure to stay hydrated.  When to Contact us  Although your surgery and recovery will likely be uneventful, you may have some residual numbness, aches, and pains in your back and/or legs. This is normal and should improve in the next few weeks.  However, should you experience any of the following, contact us immediately: New numbness or weakness Pain that is progressively getting worse, and is not relieved by your pain medications or rest Bleeding, redness, swelling, pain, or drainage from surgical incision Chills or flu-like symptoms Fever greater than 101.0 F (38.3 C) Problems with bowel or bladder functions Difficulty breathing or shortness of breath Warmth, tenderness, or swelling in your calf  Contact Information How to contact us:  If you have any questions/concerns before or after surgery, you can reach [place] at [phone], or you can send a mychart message. We can be reached by phone or mychart 8am-4pm, Monday-Friday.  *Please note: Calls after 4pm are forwarded to a third party answering service. Mychart messages are not routinely monitored during evenings, weekends, and holidays. Please call our office to contact the answering service for urgent concerns during non-business hours.

## 2023-06-19 NOTE — [place] FL2 (Signed)
[place] MEDICAID FL2 LEVEL OF CARE FORM     IDENTIFICATION  Patient Name: Teresa [name] Birthdate: 1946-03-29 Sex: female Admission Date (Current Location): 06/18/2023  [place] and [place] Number:  Chiropodist and Address:  [facility], [address] [place], [place] [address]      Provider Number: [id number]  Attending Physician Name and Address:  Venetia Night, MD  Relative Name and Phone Number:  Emmie Niemann [phone]    Current Level of Care: Hospital Recommended Level of Care: Skilled Nursing Facility Prior Approval Number:    Date Approved/Denied:   PASRR Number: [id number]  Discharge Plan: SNF    Current Diagnoses: Patient Active Problem List   Diagnosis Date Noted   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 06/18/2023   S/P lumbar fusion 06/18/2023   Dysautonomia (HCC) 09/01/2022   Memory deficit 07/12/2022   Major osseous defect    Post-traumatic arthritis of left ankle    Pain due to internal orthopedic prosthetic devices, implants and grafts, initial encounter (HCC)    Status post ankle arthrodesis 03/03/2022   Osteopenia of multiple sites 12/12/2021   Seizure-like activity (HCC) 09/27/2021   Atrial fibrillation (HCC) 08/09/2021   Hypokalemia 06/13/2021   Chronic diastolic CHF (congestive heart failure) (HCC) 05/16/2019   Benign neoplasm of cecum    Diverticulosis of large intestine without diverticulitis    Polyneuropathy associated with underlying disease (HCC) 12/11/2016   Microcalcifications of the breast 12/15/2015   Back pain, chronic 06/28/2015   Chronic airway obstruction (HCC) 06/28/2015   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   Essential hypertension 06/21/2009   CAD (coronary artery disease) 06/21/2009   DYSPHAGIA 06/21/2009   RUQ PAIN 06/21/2009   HIATAL HERNIA 01/07/2008   Hypothyroidism 11/22/2007   Anxiety 11/22/2007   DEPRESSION 11/22/2007    Hypertensive heart disease without heart failure 11/22/2007   ANGINA PECTORIS 11/22/2007   RENAL CALCULUS 11/22/2007   Osteoarthritis 11/22/2007   Sleep apnea 11/22/2007   GASTRITIS 10/23/2007   DIVERTICULOSIS, COLON 10/23/2007   Type 2 diabetes mellitus with diabetic neuropathy, without long-term current use of insulin (HCC) 07/16/2007   HYPERCHOLESTEROLEMIA 07/16/2007   OBESITY, MODERATE 07/16/2007   ISCHEMIC HEART DISEASE 07/16/2007   ESOPHAGEAL REFLUX 07/16/2007   SCLERODERMA 07/16/2007    Orientation RESPIRATION BLADDER Height & Weight     Self, Time, Situation, Place  Normal Continent Weight: 83.9 kg Height:  5\' 2"  (157.5 cm)  BEHAVIORAL SYMPTOMS/MOOD NEUROLOGICAL BOWEL NUTRITION STATUS      Continent Diet (See DC summary)  AMBULATORY STATUS COMMUNICATION OF NEEDS Skin   Extensive Assist Verbally Normal, Surgical wounds                       Personal Care Assistance Level of Assistance  Feeding, Bathing, Dressing Bathing Assistance: Limited assistance Feeding assistance: Limited assistance Dressing Assistance: Limited assistance     Functional Limitations Info  Sight, Hearing, Speech Sight Info: Adequate Hearing Info: Adequate Speech Info: Adequate    SPECIAL CARE FACTORS FREQUENCY  PT (By licensed PT), OT (By licensed OT)     PT Frequency: 5 times per week OT Frequency: 5 times per week            Contractures Contractures Info: Not present    Additional Factors Info  Code Status, Allergies Code Status Info: Full code Allergies Info: Clarithromycin, Codeine, Dilaudid  (Hydromorphone Hcl), Iodine, Iohexol, Onion, Povidone-iodine, Tamiflu  (Oseltamivir Phosphate), Zolpidem, Bacitracin-neomycin-polymyxin,  Benzalkonium Chloride, Lidocaine Hcl, Morphine, Neomycin-bacitracin Zn-polymyx, Tape, Tapentadol           Current Medications (06/19/2023):  This is the current hospital active medication list Current Facility-Administered Medications   Medication Dose Route Frequency Provider Last Rate Last Admin   acetaminophen (TYLENOL) tablet 650 mg  650 mg Oral Q4H PRN Susanne Borders, PA   650 mg at 06/18/23 2254   Or   acetaminophen (TYLENOL) suppository 650 mg  650 mg Rectal Q4H PRN Susanne Borders, PA       acetaminophen (TYLENOL) tablet 1,000 mg  1,000 mg Oral Q6H Susanne Borders, PA   1,000 mg at 06/19/23 0552   albuterol (PROVENTIL) (2.5 MG/3ML) 0.083% nebulizer solution 3 mL  3 mL Inhalation Q6H PRN Susanne Borders, PA       allopurinol (ZYLOPRIM) tablet 100 mg  100 mg Oral QHS Susanne Borders, PA   100 mg at 06/18/23 2254   atorvastatin (LIPITOR) tablet 10 mg  10 mg Oral Daily Susanne Borders, [place]   10 mg at 06/19/23 4098   docusate sodium (COLACE) capsule 100 mg  100 mg Oral BID Susanne Borders, PA   100 mg at 06/19/23 1191   donepezil (ARICEPT) tablet 5 mg  5 mg Oral QHS Susanne Borders, PA   5 mg at 06/18/23 2254   empagliflozin (JARDIANCE) tablet 25 mg  25 mg Oral Daily Susanne Borders, [place]   25 mg at 06/19/23 0928   enoxaparin (LOVENOX) injection 40 mg  40 mg Subcutaneous Q24H Susanne Borders, [place]   40 mg at 06/19/23 0809   furosemide (LASIX) tablet 40 mg  40 mg Oral Daily Susanne Borders, [place]   40 mg at 06/19/23 4782   gabapentin (NEURONTIN) capsule 800 mg  800 mg Oral TID Susanne Borders, PA   800 mg at 06/19/23 9562   glipiZIDE (GLUCOTROL) tablet 2.5 mg  2.5 mg Oral QAC breakfast Susanne Borders, PA   2.5 mg at 06/19/23 1308   insulin aspart (novoLOG) injection 0-15 Units  0-15 Units Subcutaneous TID WC Susanne Borders, [place]   5 Units at 06/19/23 1230   insulin aspart (novoLOG) injection 0-5 Units  0-5 Units Subcutaneous QHS Susanne Borders, [place]   3 Units at 06/18/23 2255   levothyroxine (SYNTHROID) tablet 88 mcg  88 mcg Oral Daily Susanne Borders, [place]   88 mcg at 06/19/23 6578   magnesium citrate solution 1 Bottle  1 Bottle Oral Once PRN Susanne Borders, PA        menthol-cetylpyridinium (CEPACOL) lozenge 3 mg  1 lozenge Oral PRN Susanne Borders, PA       Or   phenol (CHLORASEPTIC) mouth spray 1 spray  1 spray Mouth/Throat PRN Susanne Borders, PA       metFORMIN (GLUCOPHAGE) tablet 1,000 mg  1,000 mg Oral BID WC Susanne Borders, PA   1,000 mg at 06/19/23 4696   methocarbamol (ROBAXIN) tablet 500 mg  500 mg Oral Q6H PRN Susanne Borders, PA       Or   methocarbamol (ROBAXIN) injection 500 mg  500 mg Intravenous Q6H PRN Susanne Borders, PA   500 mg at 06/18/23 1205   nystatin (MYCOSTATIN/NYSTOP) topical powder 1 Application  1 Application Topical TID PRN Susanne Borders, PA       nystatin cream (MYCOSTATIN) 1 Application  1 Application Topical BID PRN Susanne Borders, PA  ondansetron (ZOFRAN) tablet 4 mg  4 mg Oral Q6H PRN Susanne Borders, PA       Or   ondansetron [facility]) injection 4 mg  4 mg Intravenous Q6H PRN Susanne Borders, PA   4 mg at 06/19/23 1229   oxyCODONE (Oxy IR/ROXICODONE) immediate release tablet 10 mg  10 mg Oral Q4H PRN Susanne Borders, PA   10 mg at 06/18/23 2254   oxyCODONE (Oxy IR/ROXICODONE) immediate release tablet 5 mg  5 mg Oral Q4H PRN Susanne Borders, PA   5 mg at 06/19/23 2130   pioglitazone (ACTOS) tablet 15 mg  15 mg Oral q morning Susanne Borders, PA   15 mg at 06/19/23 8657   polyethylene glycol (MIRALAX / GLYCOLAX) packet 17 g  17 g Oral Daily PRN Susanne Borders, PA       potassium chloride (KLOR-CON M) CR tablet 10 mEq  10 mEq Oral Daily Susanne Borders, [place]   10 mEq at 06/19/23 8469   senna (SENOKOT) tablet 8.6 mg  1 tablet Oral BID Susanne Borders, PA   8.6 mg at 06/19/23 6295   sertraline (ZOLOFT) tablet 50 mg  50 mg Oral QHS Susanne Borders, PA   50 mg at 06/18/23 2254   sodium chloride flush (NS) 0.9 % injection 3 mL  3 mL Intravenous PRN Susanne Borders, PA       sorbitol 70 % solution 30 mL  30 mL Oral Daily PRN Susanne Borders, PA         Discharge  Medications: Please see discharge summary for a list of discharge medications.  Relevant Imaging Results:  Relevant Lab Results:   Additional Information ss#[id number]  Marlowe Sax, RN

## 2023-06-19 NOTE — Plan of Care (Signed)

## 2023-06-19 NOTE — Evaluation (Signed)
Physical Therapy Evaluation Patient Details Name: Teresa [name] MRN: [id number] DOB: 1945/10/27 Today's Date: 06/19/2023  History of Present Illness  Teresa [name] is a pleasant 77 y.o. 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 S/p L4-5 lateral lumbar interbody fusion on 06/18/23.  Clinical Impression  Pt was pleasant and motivated to participate during the session and put forth good effort throughout. Pt is currently Min A +2 for STS transfers from the chair and Min A with RW for short amb bouts performed. Pt able to perform x2 bouts of ~7 feet with seated rest break between bouts Pt amb was overall steady with steps, but to get into standing position pt was mildly unstable at bilateral knees. Pt declined further amb due to diffuse waist and gluteal pain present; she is overall limited by pain despite being premedicated before session. No SOB or lightheadedness noted during session with SpO2 and HR remaining WFL throughout session on RA. Pt will benefit from continued PT services upon discharge to safely address deficits listed in patient problem list for decreased caregiver assistance and eventual return to PLOF.        If plan is discharge home, recommend the following: Direct supervision/assist for financial management;Help with stairs or ramp for entrance;Assist for transportation;Assistance with cooking/housework;A lot of help with walking and/or transfers;A lot of help with bathing/dressing/bathroom   Can travel by private vehicle        Equipment Recommendations Other (comment) (TBD at next venue of care)  Recommendations for Other Services       Functional Status Assessment Patient has had a recent decline in their functional status and demonstrates the ability to make significant improvements in function in a reasonable and predictable amount of time.      Precautions / Restrictions Precautions Precautions: Back;Fall Restrictions Weight Bearing Restrictions: No      Mobility  Bed Mobility               General bed mobility comments: pt in chair at start/end of session    Transfers Overall transfer level: Needs assistance Equipment used: Rolling walker (2 wheels) Transfers: Sit to/from Stand, Bed to chair/wheelchair/BSC Sit to Stand: Min assist, +2 physical assistance           General transfer comment: Assist needed for STS's, VC's for hand placement and sequencing provided. Mild instability noted upon ascent in bilateral knees. Pt able to sit down in chair with good eccentric control but ultimately still painful. Pt reports no SOB while perofrming amb.    Ambulation/Gait Ambulation/Gait assistance: Min assist Gait Distance (Feet): 7 Feet x2 Assistive device: Rolling walker (2 wheels) Gait Pattern/deviations: Step-through pattern, Decreased step length - right, Decreased step length - left, Decreased dorsiflexion - right, Trunk flexed Gait velocity: decreased     General Gait Details: Performed x2 short amb bouts; Pt walking with forwards flexed posture, in a braced position while taking shortened steps. No LOB noted while walking, pt reporting no concerns.  Stairs            Wheelchair Mobility     Tilt Bed    Modified Rankin (Stroke Patients Only)       Balance Overall balance assessment: Needs assistance Sitting-balance support: Feet supported, Bilateral upper extremity supported Sitting balance-Leahy Scale: Fair     Standing balance support: Bilateral upper extremity supported Standing balance-Leahy Scale: Poor Standing balance comment: stable once standing,  but needs assist to do so                             Pertinent Vitals/Pain Pain Assessment Pain Assessment: 0-10 Pain Score: 9  Pain Location: B glutes, surgical site,lower back Pain Descriptors / Indicators: Grimacing,  Guarding, Radiating, Shooting, Sharp Pain Intervention(s): Limited activity within patient's tolerance, Monitored during session, Premedicated before session, Repositioned    Home Living Family/patient expects to be discharged to:: Private residence Living Arrangements: Alone Available Help at Discharge: Family;Friend(s);Available PRN/intermittently Type of Home: House Home Access: Stairs to enter   Entrance Stairs-Number of Steps: 1+1 Alternate Level Stairs-Number of Steps: 13 Home Layout: Two level;Bed/bath upstairs;1/2 bath on main level Home Equipment: Rollator (4 wheels);Cane - single point      Prior Function Prior Level of Function : Independent/Modified Independent;History of Falls (last six months)             Mobility Comments: Mod I at limited community amb. She endorses several falls that occur daily, she has PRN use of SPC and rollator ADLs Comments: reports being Ind with bathroom use and showers, but endorses some difficulty getting in/out of tub/shower     Extremity/Trunk Assessment   Upper Extremity Assessment Upper Extremity Assessment: Overall WFL for tasks assessed    Lower Extremity Assessment Lower Extremity Assessment: Generalized weakness       Communication   Communication Communication: No apparent difficulties  Cognition Arousal: Alert Behavior During Therapy: WFL for tasks assessed/performed Overall Cognitive Status: Within Functional Limits for tasks assessed                                          General Comments      Exercises Other Exercises Other Exercises: Pt re-educated and reviewed log roll steps/technique from earlier OT session with pt having good recall of steps. Other Exercises: Pt educated on benefits of frequent mobilization in order to maintain funcitonal strength.   Assessment/Plan    PT Assessment Patient needs continued PT services  PT Problem List Decreased strength;Decreased  coordination;Decreased range of motion;Decreased activity tolerance;Decreased balance;Decreased mobility       PT Treatment Interventions DME instruction;Balance training;Gait training;Stair training;Functional mobility training;Therapeutic activities;Therapeutic exercise    PT Goals (Current goals can be found in the Care Plan section)  Acute Rehab PT Goals Patient Stated Goal: be able to walk wihtout pain again PT Goal Formulation: With patient Time For Goal Achievement: 07/02/23 Potential to Achieve Goals: Good    Frequency 7X/week     Co-evaluation               AM-PAC PT "6 Clicks" Mobility  Outcome Measure Help needed turning from your back to your side while in a flat bed without using bedrails?: A Little Help needed moving from lying on your back to sitting on the side of a flat bed without using bedrails?: A Little Help needed moving to and from a bed to a chair (including a wheelchair)?: A Lot Help needed standing up from a chair using your arms (e.g., wheelchair or bedside chair)?: A Little Help needed to walk in hospital room?: A Little Help needed climbing 3-5 steps with a railing? : A Lot 6 Click Score: 16    End of Session Equipment Utilized During Treatment: Gait belt Activity Tolerance: Patient limited by pain Patient left:  in chair;with nursing/sitter in room;with chair alarm set;with call bell/phone within reach Nurse Communication: Mobility status PT Visit Diagnosis: Muscle weakness (generalized) (M62.81);Pain;Other abnormalities of gait and mobility (R26.89);Difficulty in walking, not elsewhere classified (R26.2) Pain - Right/Left:  (bilateral) Pain - part of body:  (hips and glutes)    Time: 1000-1027 PT Time Calculation (min) (ACUTE ONLY): 27 min   Charges:                 Cecile Sheerer, SPT 06/19/23, 1:09 PM

## 2023-06-19 NOTE — Plan of Care (Signed)
  Problem: Education: Goal: Knowledge of General Education information will improve Description: Including pain rating scale, medication(s)/side effects and non-pharmacologic comfort measures Outcome: Progressing   Problem: Health Behavior/Discharge Planning: Goal: Ability to manage health-related needs will improve Outcome: Progressing   Problem: Clinical Measurements: Goal: Ability to maintain clinical measurements within normal limits will improve Outcome: Progressing Goal: Will remain free from infection Outcome: Progressing Goal: Diagnostic test results will improve Outcome: Progressing Goal: Respiratory complications will improve Outcome: Progressing Goal: Cardiovascular complication will be avoided Outcome: Progressing   Problem: Activity: Goal: Risk for activity intolerance will decrease Outcome: Progressing   Problem: Nutrition: Goal: Adequate nutrition will be maintained Outcome: Progressing   Problem: Coping: Goal: Level of anxiety will decrease Outcome: Progressing   Problem: Elimination: Goal: Will not experience complications related to bowel motility Outcome: Progressing Goal: Will not experience complications related to urinary retention Outcome: Progressing   Problem: Pain Management: Goal: General experience of comfort will improve Outcome: Progressing   Problem: Safety: Goal: Ability to remain free from injury will improve Outcome: Progressing   Problem: Skin Integrity: Goal: Risk for impaired skin integrity will decrease Outcome: Progressing   Problem: Education: Goal: Ability to describe self-care measures that may prevent or decrease complications (Diabetes Survival Skills Education) will improve Outcome: Progressing Goal: Individualized Educational Video(s) Outcome: Progressing   Problem: Coping: Goal: Ability to adjust to condition or change in health will improve Outcome: Progressing   Problem: Fluid Volume: Goal: Ability to  maintain a balanced intake and output will improve Outcome: Progressing   Problem: Health Behavior/Discharge Planning: Goal: Ability to identify and utilize available resources and services will improve Outcome: Progressing Goal: Ability to manage health-related needs will improve Outcome: Progressing   Problem: Metabolic: Goal: Ability to maintain appropriate glucose levels will improve Outcome: Progressing   Problem: Nutritional: Goal: Maintenance of adequate nutrition will improve Outcome: Progressing Goal: Progress toward achieving an optimal weight will improve Outcome: Progressing   Problem: Skin Integrity: Goal: Risk for impaired skin integrity will decrease Outcome: Progressing   Problem: Tissue Perfusion: Goal: Adequacy of tissue perfusion will improve Outcome: Progressing   Problem: Education: Goal: Ability to verbalize activity precautions or restrictions will improve Outcome: Progressing Goal: Knowledge of the prescribed therapeutic regimen will improve Outcome: Progressing Goal: Understanding of discharge needs will improve Outcome: Progressing   Problem: Activity: Goal: Ability to avoid complications of mobility impairment will improve Outcome: Progressing Goal: Ability to tolerate increased activity will improve Outcome: Progressing Goal: Will remain free from falls Outcome: Progressing   Problem: Bowel/Gastric: Goal: Gastrointestinal status for postoperative course will improve Outcome: Progressing   Problem: Clinical Measurements: Goal: Ability to maintain clinical measurements within normal limits will improve Outcome: Progressing Goal: Postoperative complications will be avoided or minimized Outcome: Progressing Goal: Diagnostic test results will improve Outcome: Progressing   Problem: Pain Management: Goal: Pain level will decrease Outcome: Progressing   Problem: Skin Integrity: Goal: Will show signs of wound healing Outcome:  Progressing   Problem: Health Behavior/Discharge Planning: Goal: Identification of resources available to assist in meeting health care needs will improve Outcome: Progressing   Problem: Bladder/Genitourinary: Goal: Urinary functional status for postoperative course will improve Outcome: Progressing

## 2023-06-19 NOTE — Inpatient Diabetes Management (Signed)
Inpatient Diabetes Program Recommendations  AACE/ADA: New Consensus Statement on Inpatient Glycemic Control (2015)  Target Ranges:  Prepandial:   less than 140 mg/dL      Peak postprandial:   less than 180 mg/dL (1-2 hours)      Critically ill patients:  140 - 180 mg/dL   Lab Results  Component Value Date   GLUCAP 184 (H) 06/19/2023   HGBA1C 7.5 (H) 06/18/2023    Review of Glycemic Control  Latest Reference Range & Units 06/18/23 16:24 06/18/23 21:52 06/18/23 22:46 06/19/23 07:55  Glucose-Capillary 70 - 99 mg/dL 657 (H) 846 (H) 962 (H) 184 (H)  (H): Data is abnormally high Diabetes history: Type 2 Dm Outpatient Diabetes medications: Glipizide 2.5 mg every day, Actos 15 mg every day, Metformin 1000 mg BID, Jardiance 25 mg QD Current orders for Inpatient glycemic control: Jardiance 25 mg every day, Actos 15 mg every day, Metformin 1000 mg BID, Glipizide 2.5 mg every day, Novolog 0-15 units TID & HS Decadron 10 mg x 1, 4 mg x 1 (today)  Inpatient Diabetes Program Recommendations:    Noted consult for hyperglycemia. Patient is post op with administration of Decadron, thus explaining increases. Glipizide added today. With addition; in agreement.  Secure message sent to PA.     Thanks, Lujean Rave, MSN, RNC-OB Diabetes Coordinator [phone] (8a-5p)

## 2023-06-19 NOTE — TOC Initial Note (Signed)
Transition of Care [facility]) - Progression Note    Patient Details  Name: Teresa [name] MRN: [id number] Date of Birth: 1946/03/14  Transition of Care [facility]) CM/SW Contact  Marlowe Sax, RN Phone Number: 06/19/2023, 2:04 PM  Clinical Narrative:     Met with the patient at the [facility]die and discussed DC plan and needs, she is agreeable to go to [facility] and stated that she will have her friend take her as she can not afford a bill for EMS, bedsearch sent, will review offers once achieved  Expected Discharge Plan: Skilled Nursing Facility Barriers to Discharge: SNF Pending bed offer, Insurance Authorization  Expected Discharge Plan and Services                                               Social Determinants of Health (SDOH) Interventions SDOH Screenings   Food Insecurity: No Food Insecurity (06/18/2023)  Recent Concern: Food Insecurity - Food Insecurity Present (05/26/2023)   Received from [facility]  Housing: Low Risk  (06/18/2023)  Transportation Needs: No Transportation Needs (06/18/2023)  Utilities: Not At Risk (06/18/2023)  Recent Concern: Utilities - At Risk (05/26/2023)   [facility] from [facility]  Alcohol Screen: Low Risk  (09/19/2021)  Depression (PHQ2-9): Low Risk  (08/22/2022)  Recent Concern: Depression (PHQ2-9) - Medium Risk (07/21/2022)  Financial Resource Strain: High Risk (05/26/2023)   Received from [facility]  Physical Activity: Sufficiently Active (09/19/2021)  Social Connections: Moderately Integrated (09/19/2021)  Stress: Stress Concern Present (09/19/2021)  Tobacco Use: Low Risk  (06/18/2023)    Readmission Risk Interventions     No data to display

## 2023-06-20 LAB — GLUCOSE, CAPILLARY
Glucose-Capillary: 154 mg/dL — ABNORMAL HIGH (ref 70–99)
Glucose-Capillary: 144 mg/dL — ABNORMAL HIGH (ref 70–99)
Glucose-Capillary: 144 mg/dL — ABNORMAL HIGH (ref 70–99)
Glucose-Capillary: 179 mg/dL — ABNORMAL HIGH (ref 70–99)

## 2023-06-20 MED ORDER — METHOCARBAMOL 1000 MG/10ML IJ SOLN
500.0000 mg | Freq: Four times a day (QID) | INTRAMUSCULAR | Status: DC
  Filled 2023-06-20 (×10): qty 5

## 2023-06-20 MED ORDER — CELECOXIB 200 MG PO CAPS
200.0000 mg | ORAL_CAPSULE | Freq: Two times a day (BID) | ORAL | Status: DC
  Administered 2023-06-20 – 2023-06-22 (×5): 200 mg via ORAL
  Filled 2023-06-20 (×5): qty 1

## 2023-06-20 MED ORDER — METHOCARBAMOL 500 MG PO TABS
500.0000 mg | ORAL_TABLET | Freq: Four times a day (QID) | ORAL | Status: DC
  Administered 2023-06-20 – 2023-06-22 (×7): 500 mg via ORAL
  Filled 2023-06-20 (×7): qty 1

## 2023-06-20 MED ORDER — GABAPENTIN 300 MG PO CAPS
900.0000 mg | ORAL_CAPSULE | Freq: Three times a day (TID) | ORAL | Status: DC
  Administered 2023-06-20 – 2023-06-22 (×6): 900 mg via ORAL
  Filled 2023-06-20 (×6): qty 3

## 2023-06-20 MED ORDER — FENTANYL CITRATE PF 50 MCG/ML IJ SOSY
12.5000 ug | PREFILLED_SYRINGE | Freq: Once | INTRAMUSCULAR | Status: AC
  Administered 2023-06-20: 12.5 ug via INTRAVENOUS
  Filled 2023-06-20: qty 1

## 2023-06-20 NOTE — TOC Progression Note (Signed)
Transition of Care [facility]) - Progression Note    Patient Details  Name: Teresa [name] MRN: [id number] Date of Birth: 06/21/1946  Transition of Care [facility]) CM/SW Contact  Marlowe Sax, RN Phone Number: 06/20/2023, 1:05 PM  Clinical Narrative:     Met with the patient and reviewed the bed offers for STR,, she chose [facility] stating she used to work there I notified Tonya at [facility], I called and left a secure VM for Tammy at [facility] HTA asking for Ins approval, the patient stated that she will get a friend to take her because she doe snot want to pay for EMS  Expected Discharge Plan: Skilled Nursing Facility Barriers to Discharge: SNF Pending bed offer, Insurance Authorization  Expected Discharge Plan and Services                                               Social Determinants of Health (SDOH) Interventions SDOH Screenings   Food Insecurity: No Food Insecurity (06/18/2023)  Recent Concern: Food Insecurity - Food Insecurity Present (05/26/2023)   Received from [facility]  Housing: Low Risk  (06/18/2023)  Transportation Needs: No Transportation Needs (06/18/2023)  Utilities: Not At Risk (06/18/2023)  Recent Concern: Utilities - At Risk (05/26/2023)   Received from [facility]  Alcohol Screen: Low Risk  (09/19/2021)  Depression (PHQ2-9): Low Risk  (08/22/2022)  Recent Concern: Depression (PHQ2-9) - Medium Risk (07/21/2022)  Financial Resource Strain: High Risk (05/26/2023)   Received from [facility]  Physical Activity: Sufficiently Active (09/19/2021)  Social Connections: Moderately Integrated (09/19/2021)  Stress: Stress Concern Present (09/19/2021)  Tobacco Use: Low Risk  (06/18/2023)    Readmission Risk Interventions     No data to display

## 2023-06-20 NOTE — Progress Notes (Signed)
Occupational Therapy Treatment Patient Details Name: Teresa [name] MRN: [id number] DOB: [birth year] Today's Date: 06/20/2023   History of present illness Riylee Soulliere is a pleasant 77 y.o. 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 S/p L4-5 lateral lumbar interbody fusion on 06/18/23.   OT comments  Ms Manago was seen for OT treatment on this date. Upon arrival to room pt reclined in bed, agreeable to tx. Pt requires MIN A bed mobility, cues to log roll. MIN A + RW for BSC t/f and pericare sitting. SETUP + SUPERVISION seated grooming tasks, cues to avoid twisting. Tolerated ~15 ft mobility BSC>chair. Pt making good progress toward goals, will continue to follow POC. Discharge recommendation remains appropriate.        If plan is discharge home, recommend the following:  A lot of help with walking and/or transfers;A lot of help with bathing/dressing/bathroom;Help with stairs or ramp for entrance   Equipment Recommendations  BSC/3in1    Recommendations for Other Services      Precautions / Restrictions Precautions Precautions: Back;Fall Restrictions Weight Bearing Restrictions: No       Mobility Bed Mobility Overal bed mobility: Needs Assistance Bed Mobility: Rolling, Sidelying to Sit Rolling: Min assist Sidelying to sit: Min assist            Transfers Overall transfer level: Needs assistance Equipment used: Rolling walker (2 wheels) Transfers: Sit to/from Stand, Bed to chair/wheelchair/BSC Sit to Stand: Min assist     Step pivot transfers: Contact guard assist           Balance Overall balance assessment: Needs assistance Sitting-balance support: Feet supported, Bilateral upper extremity supported Sitting balance-Leahy Scale: Good     Standing balance support: Bilateral upper extremity supported Standing balance-Leahy Scale: Poor                              ADL either performed or assessed with clinical judgement   ADL Overall ADL's : Needs assistance/impaired                                       General ADL Comments: MAX A don B socks in sitting. MIN A + RW for BSC t/f and pericare sitting. SETUP + SUPERVISION seated grooming tasks, cues to avoid twisting.      Cognition Arousal: Alert Behavior During Therapy: WFL for tasks assessed/performed Overall Cognitive Status: Within Functional Limits for tasks assessed                                                     Pertinent Vitals/ Pain       Pain Assessment Pain Assessment: 0-10 Pain Score: 5  Pain Location: B glutes, surgical site,lower back Pain Descriptors / Indicators: Grimacing, Guarding, Radiating, Shooting, Sharp Pain Intervention(s): Limited activity within patient's tolerance, Premedicated before session   Frequency  Min 1X/week        Progress Toward Goals  OT Goals(current goals can now be found in the care plan section)  Progress towards OT goals: Progressing toward goals  Acute Rehab OT Goals Patient Stated Goal: to go to rehab  OT Goal Formulation: With patient Time For Goal Achievement: 07/03/23 Potential to Achieve Goals: Good ADL Goals Pt Will Perform Grooming: with modified independence;standing Pt Will Perform Lower Body Dressing: with contact guard assist;with adaptive equipment;sit to/from stand Pt Will Transfer to Toilet: with modified independence;ambulating;regular height toilet Pt Will Perform Toileting - Clothing Manipulation and hygiene: with modified independence;sitting/lateral leans  Plan      Co-evaluation                 AM-PAC OT "6 Clicks" Daily Activity     Outcome Measure   Help from another person eating meals?: None Help from another person taking care of personal grooming?: A Little Help from another person toileting, which includes using toliet,  bedpan, or urinal?: A Lot Help from another person bathing (including washing, rinsing, drying)?: A Lot Help from another person to put on and taking off regular upper body clothing?: None Help from another person to put on and taking off regular lower body clothing?: A Lot 6 Click Score: 17    End of Session Equipment Utilized During Treatment: Rolling walker (2 wheels)  OT Visit Diagnosis: Other abnormalities of gait and mobility (R26.89);Muscle weakness (generalized) (M62.81)   Activity Tolerance Patient tolerated treatment well   Patient Left in chair;with call bell/phone within reach   Nurse Communication Mobility status        Time: 8295-6213 OT Time Calculation (min): 18 min  Charges: OT General Charges $OT Visit: 1 Visit OT Treatments $Self Care/Home Management : 8-22 mins  Kathie Dike, M.S. OTR/L  06/20/23, 11:27 AM  ascom [phone]

## 2023-06-20 NOTE — Inpatient Diabetes Management (Signed)
Inpatient Diabetes Program Recommendations  AACE/ADA: New Consensus Statement on Inpatient Glycemic Control (2015)  Target Ranges:  Prepandial:   less than 140 mg/dL      Peak postprandial:   less than 180 mg/dL (1-2 hours)      Critically ill patients:  140 - 180 mg/dL    Latest Reference Range & Units 06/19/23 07:55 06/19/23 11:48 06/19/23 16:55 06/19/23 20:33  Glucose-Capillary 70 - 99 mg/dL  4 mg Decadron @0927  184 (H)  3 units Novolog  247 (H)  5 units Novolog  274 (H)  8 units Novolog  211 (H)  2 units Novolog   (H): Data is abnormally high  Latest Reference Range & Units 06/20/23 07:39 06/20/23 11:43  Glucose-Capillary 70 - 99 mg/dL 161 (H)  3 units Novolog  144 (H)  2 units Novolog   (H): Data is abnormally high     Home DM Meds: Glipizide 2.5 mg every day, Actos 15 mg every day, Metformin 1000 mg BID, Jardiance 25 mg QD    Current Orders: Novolog Moderate Correction Scale/ SSI (0-15 units) TID AC + HS      Jardiance 25 mg every day    Actos 15 mg every day    Metformin 1000 mg BID    Glipizide 2.5 mg every day,         Note pt received Decadron 11/11 and 11/12  CBGs much improved today  Will follow    --Will follow patient during hospitalization--  Ambrose Finland RN, MSN, CDCES Diabetes Coordinator Inpatient Glycemic Control Team Team Pager: [phone] (8a-5p)

## 2023-06-20 NOTE — Progress Notes (Signed)
Physical Therapy Treatment Patient Details Name: Teresa [name] MRN: [id number] DOB: 1946-02-21 Today's Date: 06/20/2023   History of Present Illness Teresa [name] is a pleasant 77 y.o. 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 S/p L4-5 lateral lumbar interbody fusion on 06/18/23.    PT Comments  Pt was pleasant and motivated to participate during the session and put forth good effort throughout. She is currently min A at most for STS transfers and amb with RW. Pt able to perform multiple STS's from variable heights and surfaces during session, with VC's for forwards weight shift. Pt performed 2x 15 feet of amb with RW, with mostly CGA and once instance of Min A to correct minor LOB on initial bout. HR and SpO2 remained WFL on RA throughout session; pt denies SOB or dizziness. Pt will benefit from continued PT services upon discharge to safely address deficits listed in patient problem list for decreased caregiver assistance and eventual return to PLOF.     If plan is discharge home, recommend the following: Help with stairs or ramp for entrance;Assist for transportation;Assistance with cooking/housework;A lot of help with walking and/or transfers;A lot of help with bathing/dressing/bathroom   Can travel by private vehicle        Equipment Recommendations  Other (comment) (TBD at next venue of care)    Recommendations for Other Services       Precautions / Restrictions Precautions Precautions: Back;Fall Restrictions Weight Bearing Restrictions: No Other Position/Activity Restrictions: No brace needed     Mobility  Bed Mobility               General bed mobility comments: pt in chair at start/end of session    Transfers Overall transfer level: Needs assistance Equipment used: Rolling walker (2 wheels) Transfers: Sit to/from Stand Sit to Stand: Min  assist           General transfer comment: Pt performed STS's from variable surfaces and heights, consistntly needing assist to perform with VC's for forward lean/hinge at hips for comfort. No instability noted with transfers.    Ambulation/Gait Ambulation/Gait assistance: Min assist Gait Distance (Feet): 15 Feet x2 Assistive device: Rolling walker (2 wheels) Gait Pattern/deviations: Step-through pattern, Decreased step length - right, Decreased step length - left, Decreased dorsiflexion - right, Trunk flexed Gait velocity: decreased     General Gait Details: Pt performed X 2 short amb bouts, pt walking with effortful steps and slow cadence, but step through pattern seen. Mostly CGA with walking with one instance of assit when pt had minor LOB needing very light asssit for fully correct.   Stairs             Wheelchair Mobility     Tilt Bed    Modified Rankin (Stroke Patients Only)       Balance Overall balance assessment: Needs assistance Sitting-balance support: Feet supported, Bilateral upper extremity supported Sitting balance-Leahy Scale: Good     Standing balance support: Single extremity supported, During functional activity Standing balance-Leahy Scale: Fair Standing balance comment: able to take meds during session in standing position with SUE support                            Cognition Arousal: Alert Behavior During Therapy: [facility] for tasks assessed/performed Overall Cognitive Status: Within Functional Limits for tasks assessed  Exercises Other Exercises Other Exercises: reviewed log roll with pt, with pt having good recall from prior sessions.    General Comments        Pertinent Vitals/Pain Pain Assessment Pain Assessment: 0-10 Pain Score: 10-Worst pain ever Pain Location: B glutes, surgical site,lower back Pain Descriptors / Indicators: Grimacing, Guarding, Radiating,  Shooting, Sharp Pain Intervention(s): RN gave pain meds during session, Monitored during session, Limited activity within patient's tolerance    Home Living                          Prior Function            PT Goals (current goals can now be found in the care plan section) Progress towards PT goals: Progressing toward goals    Frequency    7X/week      PT Plan      Co-evaluation              AM-PAC PT "6 Clicks" Mobility   Outcome Measure  Help needed turning from your back to your side while in a flat bed without using bedrails?: A Little Help needed moving from lying on your back to sitting on the side of a flat bed without using bedrails?: A Little Help needed moving to and from a bed to a chair (including a wheelchair)?: A Little Help needed standing up from a chair using your arms (e.g., wheelchair or bedside chair)?: A Little Help needed to walk in hospital room?: A Little Help needed climbing 3-5 steps with a railing? : A Lot 6 Click Score: 17    End of Session Equipment Utilized During Treatment: Gait belt Activity Tolerance: Patient limited by pain;Patient tolerated treatment well Patient left: in chair;with nursing/sitter in room;with chair alarm set;with call bell/phone within reach Nurse Communication: Mobility status PT Visit Diagnosis: Muscle weakness (generalized) (M62.81);Pain;Other abnormalities of gait and mobility (R26.89);Difficulty in walking, not elsewhere classified (R26.2) Pain - Right/Left:  (bilateral) Pain - part of body:  (bilateral)     Time: 1610-9604 PT Time Calculation (min) (ACUTE ONLY): 25 min  Charges:                            Cecile Sheerer, SPT 06/20/23, 3:48 PM

## 2023-06-20 NOTE — Plan of Care (Signed)

## 2023-06-20 NOTE — Progress Notes (Addendum)
   Neurosurgery Progress Note  History: Teresa [name] is s/p L4-5 XLIF and PSD  POD2: continues right buttock and anterior thigh and groin pain with associated weakness.  POD1: Pt complaining of ongoing right buttock pain and soreness in right anterior thigh and groin   Physical Exam: Vitals:   06/19/23 2331 06/20/23 0740  BP: 128/73 103/66  Pulse: 71 63  Resp: 19 16  Temp: 97.8 F (36.6 C) 98 F (36.7 C)  SpO2: 95% 94%    AA Ox3 CNI  Strength:5/5 throughout BLE except 3/5 right HF and KE  Incisions c/d/I with post-op dressings in place    Assessment/Plan:  Teresa [name] is a 77 y.o presenting with lumbar stenosis and adjacent segment disease s/p L4-5 XLIF and PSF.   - mobilize - pain control; will hold off on additional steroids given patients blood sugars and hs of previous post-op infection - DVT prophylaxis - Diabetic coordinator consults. We appreciate your recommendations. continue to monitor elevated blood sugars  - PTOT  Manning Charity PA-C Department of Neurosurgery    Per [facility] inquiry, I am responding to confirm that she qualifies as having Class 1 Obesity.

## 2023-06-21 LAB — GLUCOSE, CAPILLARY
Glucose-Capillary: 121 mg/dL — ABNORMAL HIGH (ref 70–99)
Glucose-Capillary: 237 mg/dL — ABNORMAL HIGH (ref 70–99)
Glucose-Capillary: 136 mg/dL — ABNORMAL HIGH (ref 70–99)
Glucose-Capillary: 108 mg/dL — ABNORMAL HIGH (ref 70–99)

## 2023-06-21 MED ORDER — PROMETHAZINE (PHENERGAN) 6.25MG IN NS 50ML IVPB
6.2500 mg | Freq: Four times a day (QID) | INTRAVENOUS | Status: DC | PRN
Start: 2023-06-21 — End: 2023-06-22
  Administered 2023-06-21: 6.25 mg via INTRAVENOUS
  Filled 2023-06-21: qty 6.25

## 2023-06-21 MED ORDER — LACTULOSE 10 GM/15ML PO SOLN
20.0000 g | Freq: Once | ORAL | Status: DC
Start: 2023-06-21 — End: 2023-06-22

## 2023-06-21 NOTE — TOC Progression Note (Signed)
Transition of Care [facility]) - Progression Note    Patient Details  Name: Teresa [name] MRN: [id number] Date of Birth: [birth year]  Transition of Care [facility]) CM/SW Contact  Marlowe Sax, RN Phone Number: 06/21/2023, 8:42 AM  Clinical Narrative:    Received a call from HTA [facility] the INS approved To go to [facility] and have approved EMS to transport to [facility] approval number [id number]   Expected Discharge Plan: Skilled Nursing Facility Barriers to Discharge: SNF Pending bed offer, Insurance Authorization  Expected Discharge Plan and Services                                               Social Determinants of Health (SDOH) Interventions SDOH Screenings   Food Insecurity: No Food Insecurity (06/18/2023)  Recent Concern: Food Insecurity - Food Insecurity Present (05/26/2023)   Received from [facility]  Housing: Low Risk  (06/18/2023)  Transportation Needs: No Transportation Needs (06/18/2023)  Utilities: Not At Risk (06/18/2023)  Recent Concern: Utilities - At Risk (05/26/2023)   Received from [facility]  Alcohol Screen: Low Risk  (09/19/2021)  Depression (PHQ2-9): Low Risk  (08/22/2022)  Recent Concern: Depression (PHQ2-9) - Medium Risk (07/21/2022)  Financial Resource Strain: High Risk (05/26/2023)   Received from [facility]  Physical Activity: Sufficiently Active (09/19/2021)  Social Connections: Moderately Integrated (09/19/2021)  Stress: Stress Concern Present (09/19/2021)  Tobacco Use: Low Risk  (06/18/2023)    Readmission Risk Interventions     No data to display

## 2023-06-21 NOTE — Care Management Important Message (Signed)
Important Message  Patient Details  Name: Teresa [name] MRN: [id number] Date of Birth: 23-Dec-1945   Important Message Given:  N/A - LOS <3 / Initial given by admissions     Olegario Messier [name] 06/21/2023, 9:08 AM

## 2023-06-21 NOTE — TOC Progression Note (Signed)
Transition of Care [facility]) - Progression Note    Patient Details  Name: Teresa [name] MRN: [id number] Date of Birth: 02/14/1946  Transition of Care [facility]) CM/SW Contact  Marlowe Sax, RN Phone Number: 06/21/2023, 12:10 PM  Clinical Narrative:    Sharyn Creamer at [facility] and confirmed that they will still have a bed for her tomorrow    Expected Discharge Plan: Skilled Nursing Facility Barriers to Discharge: SNF Pending bed offer, Insurance Authorization  Expected Discharge Plan and Services                                               Social Determinants of Health (SDOH) Interventions SDOH Screenings   Food Insecurity: No Food Insecurity (06/18/2023)  Recent Concern: Food Insecurity - Food Insecurity Present (05/26/2023)   Received from [facility]  Housing: Low Risk  (06/18/2023)  Transportation Needs: No Transportation Needs (06/18/2023)  Utilities: Not At Risk (06/18/2023)  Recent Concern: Utilities - At Risk (05/26/2023)   Received from [facility]  Alcohol Screen: Low Risk  (09/19/2021)  Depression (PHQ2-9): Low Risk  (08/22/2022)  Recent Concern: Depression (PHQ2-9) - Medium Risk (07/21/2022)  Financial Resource Strain: High Risk (05/26/2023)   Received from [facility]  Physical Activity: Sufficiently Active (09/19/2021)  Social Connections: Moderately Integrated (09/19/2021)  Stress: Stress Concern Present (09/19/2021)  Tobacco Use: Low Risk  (06/18/2023)    Readmission Risk Interventions     No data to display

## 2023-06-21 NOTE — Plan of Care (Signed)

## 2023-06-21 NOTE — Progress Notes (Signed)
   Neurosurgery Progress Note  History: ZAYDI GILLELAND is s/p L4-5 XLIF and PSD  POD3: continued back pain and right thigh weakness. Unchanged POD2: continues right buttock and anterior thigh and groin pain with associated weakness.  POD1: Pt complaining of ongoing right buttock pain and soreness in right anterior thigh and groin   Physical Exam: Vitals:   06/20/23 2302 06/21/23 0753  BP: 130/63 109/63  Pulse: 62 63  Resp: 20 14  Temp: 97.8 F (36.6 C) 98.1 F (36.7 C)  SpO2: 90% 92%    AA Ox3 CNI  Strength:5/5 throughout BLE except 3/5 right HF and KE  Incisions c/d/I.  Diffuse ecchymosis throughout her lower back and right hip.  No palpable fluid collection or hematoma.  Assessment/Plan:  MCKENNA DELAPLANE is a 77 y.o presenting with lumbar stenosis and adjacent segment disease s/p L4-5 XLIF and PSF.   - mobilize - pain control; will hold off on additional steroids given patients blood sugars and hs of previous post-op infection - dressings removed this morning.  - recommended ice for bruising and ongoing back pain. -Patient has not had a BM since 11/10.  She is not having any significant abdominal pain or distention.  Will escalate bowel regimen today - DVT prophylaxis - Diabetic coordinator consults. We appreciate your recommendations. continue to monitor elevated blood sugars  - PTOT; plan for SNF at d/c. Pt will likely be ready for discharge this afternoon pending response to changes in medication regimen and bowel regimen.  Manning Charity PA-C Department of Neurosurgery

## 2023-06-21 NOTE — Progress Notes (Signed)
Physical Therapy Treatment Patient Details Name: Teresa [name] MRN: [id number] DOB: [birth year] Today's Date: 06/21/2023   History of Present Illness Teresa [name] is a pleasant 77 y.o. 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 S/p L4-5 lateral lumbar interbody fusion on 06/18/23.    PT Comments  Pt was supine in bed with HOB elevated ~ 15-20 degrees. She is A and O x 4. Still endorsing pain but pain did not limit her abilities to exit bed, stand, or ambulate ~ 50 ft with RW. At conclusion of session, pt was sitting in recliner with ice pack placed on back due to swelling/bruising per PA request.Pt will benefit from continued skilled PT to maximize her independence and safety with all ADLs prior to returning home I'ly. DC recs remain appropriate.    If plan is discharge home, recommend the following: Help with stairs or ramp for entrance;Assist for transportation;Assistance with cooking/housework;A lot of help with walking and/or transfers;A lot of help with bathing/dressing/bathroom     Equipment Recommendations  Other (comment) (Defer to next level of care)       Precautions / Restrictions Precautions Precautions: Back;Fall Restrictions Weight Bearing Restrictions: No Other Position/Activity Restrictions: No brace needed     Mobility  Bed Mobility Overal bed mobility: Needs Assistance Bed Mobility: Rolling, Sidelying to Sit, Supine to Sit Rolling: Min assist Sidelying to sit: Min assist Supine to sit: Min assist     Transfers Overall transfer level: Needs assistance Equipment used: Rolling walker (2 wheels) Transfers: Sit to/from Stand Sit to Stand: Min assist     Ambulation/Gait Ambulation/Gait assistance: Contact guard assist Gait Distance (Feet): 50 Feet Assistive device: Rolling walker (2 wheels) Gait Pattern/deviations: Step-through pattern,  Decreased step length - right, Decreased step length - left, Decreased dorsiflexion - right, Trunk flexed Gait velocity: decreased  General Gait Details: Pt was able to ambulate 50 ft with RW with slow antalgic gait    Balance Overall balance assessment: Needs assistance Sitting-balance support: Feet supported, Bilateral upper extremity supported Sitting balance-Leahy Scale: Good     Standing balance support: Bilateral upper extremity supported Standing balance-Leahy Scale: Fair Standing balance comment: able to take meds during session in standing position with SUE support       Cognition Arousal: Alert Behavior During Therapy: WFL for tasks assessed/performed Overall Cognitive Status: Within Functional Limits for tasks assessed    General Comments: Pt is A and O x 4               Pertinent Vitals/Pain Pain Assessment Pain Assessment: 0-10 Pain Score: 8  Pain Location: B glutes, surgical site,lower back Pain Descriptors / Indicators: Grimacing, Guarding, Radiating, Shooting, Sharp Pain Intervention(s): Limited activity within patient's tolerance, Monitored during session, Premedicated before session, Repositioned, Ice applied     PT Goals (current goals can now be found in the care plan section) Acute Rehab PT Goals Patient Stated Goal: "Get better and then return home." Progress towards PT goals: Progressing toward goals    Frequency    7X/week       AM-PAC PT "6 Clicks" Mobility   Outcome Measure  Help needed turning from your back to your side while in a flat bed without using bedrails?: A Little Help needed moving from lying on your back to sitting on the side of a flat bed without using bedrails?: A Little Help needed moving to and from a  bed to a chair (including a wheelchair)?: A Little Help needed standing up from a chair using your arms (e.g., wheelchair or bedside chair)?: A Little Help needed to walk in hospital room?: A Little Help needed  climbing 3-5 steps with a railing? : A Lot 6 Click Score: 17    End of Session   Activity Tolerance: Patient tolerated treatment well Patient left: in chair;with nursing/sitter in room;with chair alarm set;with call bell/phone within reach Nurse Communication: Mobility status PT Visit Diagnosis: Muscle weakness (generalized) (M62.81);Pain;Other abnormalities of gait and mobility (R26.89);Difficulty in walking, not elsewhere classified (R26.2)     Time: 1610-9604 PT Time Calculation (min) (ACUTE ONLY): 22 min  Charges:    $Gait Training: 8-22 mins PT General Charges $$ ACUTE PT VISIT: 1 Visit                     Jetta Lout PTA 06/21/23, 1:26 PM

## 2023-06-22 ENCOUNTER — Encounter: Payer: Self-pay | Admitting: Neurosurgery

## 2023-06-22 DIAGNOSIS — I4891 Unspecified atrial fibrillation: Secondary | ICD-10-CM | POA: Diagnosis not present

## 2023-06-22 DIAGNOSIS — Z981 Arthrodesis status: Secondary | ICD-10-CM | POA: Diagnosis not present

## 2023-06-22 DIAGNOSIS — F419 Anxiety disorder, unspecified: Secondary | ICD-10-CM | POA: Diagnosis not present

## 2023-06-22 DIAGNOSIS — I10 Essential (primary) hypertension: Secondary | ICD-10-CM | POA: Diagnosis not present

## 2023-06-22 DIAGNOSIS — M48061 Spinal stenosis, lumbar region without neurogenic claudication: Secondary | ICD-10-CM | POA: Diagnosis not present

## 2023-06-22 DIAGNOSIS — E039 Hypothyroidism, unspecified: Secondary | ICD-10-CM | POA: Diagnosis not present

## 2023-06-22 DIAGNOSIS — R1311 Dysphagia, oral phase: Secondary | ICD-10-CM | POA: Diagnosis not present

## 2023-06-22 DIAGNOSIS — R531 Weakness: Secondary | ICD-10-CM | POA: Diagnosis not present

## 2023-06-22 DIAGNOSIS — M6281 Muscle weakness (generalized): Secondary | ICD-10-CM | POA: Diagnosis not present

## 2023-06-22 DIAGNOSIS — J449 Chronic obstructive pulmonary disease, unspecified: Secondary | ICD-10-CM | POA: Diagnosis not present

## 2023-06-22 DIAGNOSIS — R011 Cardiac murmur, unspecified: Secondary | ICD-10-CM | POA: Diagnosis not present

## 2023-06-22 DIAGNOSIS — M4326 Fusion of spine, lumbar region: Secondary | ICD-10-CM | POA: Diagnosis not present

## 2023-06-22 DIAGNOSIS — R41841 Cognitive communication deficit: Secondary | ICD-10-CM | POA: Diagnosis not present

## 2023-06-22 DIAGNOSIS — R488 Other symbolic dysfunctions: Secondary | ICD-10-CM | POA: Diagnosis not present

## 2023-06-22 DIAGNOSIS — M51369 Other intervertebral disc degeneration, lumbar region without mention of lumbar back pain or lower extremity pain: Secondary | ICD-10-CM | POA: Diagnosis not present

## 2023-06-22 DIAGNOSIS — M4316 Spondylolisthesis, lumbar region: Secondary | ICD-10-CM | POA: Diagnosis not present

## 2023-06-22 DIAGNOSIS — E119 Type 2 diabetes mellitus without complications: Secondary | ICD-10-CM | POA: Diagnosis not present

## 2023-06-22 DIAGNOSIS — M109 Gout, unspecified: Secondary | ICD-10-CM | POA: Diagnosis not present

## 2023-06-22 DIAGNOSIS — I2510 Atherosclerotic heart disease of native coronary artery without angina pectoris: Secondary | ICD-10-CM | POA: Diagnosis not present

## 2023-06-22 DIAGNOSIS — I119 Hypertensive heart disease without heart failure: Secondary | ICD-10-CM | POA: Diagnosis not present

## 2023-06-22 DIAGNOSIS — I5032 Chronic diastolic (congestive) heart failure: Secondary | ICD-10-CM | POA: Diagnosis not present

## 2023-06-22 DIAGNOSIS — R29898 Other symptoms and signs involving the musculoskeletal system: Secondary | ICD-10-CM | POA: Diagnosis not present

## 2023-06-22 DIAGNOSIS — J45909 Unspecified asthma, uncomplicated: Secondary | ICD-10-CM | POA: Diagnosis not present

## 2023-06-22 DIAGNOSIS — T8149XA Infection following a procedure, other surgical site, initial encounter: Secondary | ICD-10-CM | POA: Diagnosis not present

## 2023-06-22 DIAGNOSIS — Z7401 Bed confinement status: Secondary | ICD-10-CM | POA: Diagnosis not present

## 2023-06-22 LAB — GLUCOSE, CAPILLARY
Glucose-Capillary: 149 mg/dL — ABNORMAL HIGH (ref 70–99)
Glucose-Capillary: 93 mg/dL (ref 70–99)

## 2023-06-22 MED ORDER — METHOCARBAMOL 500 MG PO TABS: 500.0000 mg | ORAL_TABLET | Freq: Four times a day (QID) | ORAL | Status: DC

## 2023-06-22 MED ORDER — SENNA 8.6 MG PO TABS: 1.0000 | ORAL_TABLET | Freq: Two times a day (BID) | ORAL | Status: DC

## 2023-06-22 MED ORDER — OXYCODONE HCL 5 MG PO TABS: 5.0000 mg | ORAL_TABLET | ORAL | 0 refills | Status: DC | PRN

## 2023-06-22 MED ORDER — CELECOXIB 200 MG PO CAPS: 200.0000 mg | ORAL_CAPSULE | Freq: Two times a day (BID) | ORAL | Status: DC

## 2023-06-22 NOTE — Progress Notes (Signed)
Nursing Discharge Note  Name: Teresa [name] MRN: [id number] DOB: 10-24-1945  Admit Date: 06/18/2023 Discharge Date: 06/22/2023  Teresa [name] is  to be discharged to a Skilled Nursing Facility per MD order.  AVS completed, placed in discharge packet for facility review. Discharge packet compiled for facility. Non-emergency ambulance transport arranged. Report called to [employer] LPN at [facility].   Allergies as of 06/22/2023       Reactions   Clarithromycin Nausea And Vomiting, Other (See Comments)   Codeine    Hallucination   Dilaudid  [hydromorphone Hcl] Nausea And Vomiting   Iodine Hives, Itching   Iohexol Hives, Itching   Onion Diarrhea, Nausea And Vomiting   Abdominal pain   Povidone-iodine Itching   Tamiflu  [oseltamivir Phosphate] Nausea And Vomiting   Abdominal Pain,   Zolpidem Nausea And Vomiting, Other (See Comments)   Hallucinations   Bacitracin-neomycin-polymyxin Itching, Rash   Benzalkonium Chloride Itching, Rash, Swelling   Lidocaine Hcl Itching, Rash, Swelling   Morphine Nausea And Vomiting, Rash   Neomycin-bacitracin Zn-polymyx Itching, Rash   Tape Itching, Rash   Adhesive tape - silicone   Tapentadol Rash        Medication List     STOP taking these medications    apixaban 5 MG Tabs tablet Commonly known as: ELIQUIS   traMADol 50 MG tablet Commonly known as: ULTRAM       TAKE these medications    acetaminophen 500 MG tablet Commonly known as: TYLENOL Take 1,000 mg by mouth every 6 (six) hours as needed for mild pain or headache.   albuterol 108 (90 Base) MCG/ACT inhaler Commonly known as: VENTOLIN HFA Inhale 2 puffs into the lungs every 6 (six) hours as needed for shortness of breath.   allopurinol 100 MG tablet Commonly known as: ZYLOPRIM Take 1 tablet (100 mg total) by mouth daily. What changed: when to take this   atorvastatin 10 MG tablet Commonly known as: LIPITOR TAKE ONE TABLET BY MOUTH EVERYDAY AT BEDTIME    celecoxib 200 MG capsule Commonly known as: CELEBREX Take 1 capsule (200 mg total) by mouth 2 (two) times daily.   donepezil 5 MG tablet Commonly known as: ARICEPT Take 1 tablet (5 mg total) by mouth at bedtime.   empagliflozin 25 MG Tabs tablet Commonly known as: Jardiance Take 1 tablet (25 mg total) by mouth daily.   FreeStyle Libre 2 Sensor Misc 1 each by Does not apply route every 14 (fourteen) days.   furosemide 40 MG tablet Commonly known as: LASIX Take 1 tablet (40 mg total) by mouth daily.   gabapentin 800 MG tablet Commonly known as: NEURONTIN Take 1 tablet (800 mg total) by mouth 3 (three) times daily.   glipiZIDE 5 MG tablet Commonly known as: GLUCOTROL Take 2.5 mg by mouth daily before breakfast.   levothyroxine 88 MCG tablet Commonly known as: SYNTHROID TAKE 1 TABLET BY MOUTH EVERY DAY   metFORMIN 1000 MG tablet Commonly known as: GLUCOPHAGE Take 1 tablet (1,000 mg total) by mouth 2 (two) times daily.   methocarbamol 500 MG tablet Commonly known as: ROBAXIN Take 1 tablet (500 mg total) by mouth every 6 (six) hours.   nystatin cream Commonly known as: MYCOSTATIN Apply 1 Application topically 2 (two) times daily as needed (skin irritation/rash.). What changed: Another medication with the same name was changed. Make sure you understand how and when to take each.   nystatin powder Commonly known as: MYCOSTATIN/NYSTOP Apply 1 Application topically 3 (  three) times daily. What changed:  when to take this reasons to take this   ondansetron 4 MG tablet Commonly known as: ZOFRAN TAKE 1 TABLET BY MOUTH EVERY 8 HOURS AS NEEDED FOR NAUSEA AND VOMITING   oxyCODONE 5 MG immediate release tablet Commonly known as: Oxy IR/ROXICODONE Take 1-2 tablets (5-10 mg total) by mouth every 4 (four) hours as needed for moderate pain (pain score 4-6) ((score 4 to 6)).   Ozempic (2 MG/DOSE) 8 MG/3ML Sopn Generic drug: Semaglutide (2 MG/DOSE) Inject 2 mg into the skin  every Sunday.   pioglitazone 15 MG tablet Commonly known as: ACTOS Take 1 tablet (15 mg total) by mouth every morning.   potassium chloride 10 MEQ tablet Commonly known as: KLOR-CON Take 1 tablet (10 mEq total) by mouth daily for 12 days.   promethazine 12.5 MG tablet Commonly known as: PHENERGAN Take 1 tablet (12.5 mg total) by mouth every 6 (six) hours as needed for nausea or vomiting.   senna 8.6 MG Tabs tablet Commonly known as: SENOKOT Take 1 tablet (8.6 mg total) by mouth 2 (two) times daily.   sertraline 50 MG tablet Commonly known as: ZOLOFT Take 1 tablet (50 mg total) by mouth daily. What changed: when to take this   Trelegy Ellipta 200-62.5-25 MCG/ACT Aepb Generic drug: Fluticasone-Umeclidin-Vilant Inhale 1 puff into the lungs daily.         Discharge Instructions     Incentive spirometry RT   Complete by: As directed          [facility] to provide transportation to facility for patient. Non-emergency ambulance transport at bedside. Handoff completed with [facility] staff/EMTs.   Patient discharged from hospital unit via stretcher. Stable at time of discharge.

## 2023-06-22 NOTE — Consult Note (Signed)
[facility] Liaison Note  06/22/2023  Teresa [name] 1945-08-19 [id number]  Location: RN Hospital Liaison screened the patient remotely at [facility].  Insurance: [employer]   Teresa [name] is a 77 y.o. female who is a Primary Care Patient of Bosie Clos, MD The patient was screened for readmission hospitalization with noted low risk score for unplanned readmission risk with 1 IP in 6 months.  The patient was assessed for potential Care Management service needs for post hospital transition for care coordination. Review of patient's electronic medical record reveals patient was admitted with S/P Lumbar Fusion. Pt discharged to SNF level of care today for [facility] via transport EMS. This facility will continue to address pt's ongoing needs.    [facility] Care Management/Population Health does not replace or interfere with any arrangements made by the Inpatient Transition of Care team.   For questions contact:   Elliot Cousin, RN, [facility] Liaison [place]   [facility], Population Health Office Hours MTWF  8:00 am-6:00 pm Direct Dial: [phone] mobile [phone] [Office toll free line] Office Hours are M-F 8:30 - 5 pm [email or web address]

## 2023-06-22 NOTE — TOC Progression Note (Signed)
Transition of Care [facility]) - Progression Note    Patient Details  Name: Teresa [name] MRN: [id number] Date of Birth: [birth year]  Transition of Care [facility]) CM/SW Contact  Marlowe Sax, RN Phone Number: 06/22/2023, 10:27 AM  Clinical Narrative:    Ins approved EMS  Going to [facility] room 5 B She stated that her friend can not transport her and will need to go EMS EMS called to transport    Expected Discharge Plan: Skilled Nursing Facility Barriers to Discharge: SNF Pending bed offer, Insurance Authorization  Expected Discharge Plan and Services         Expected Discharge Date: 06/21/23                                     Social Determinants of Health (SDOH) Interventions SDOH Screenings   Food Insecurity: No Food Insecurity (06/18/2023)  Recent Concern: Food Insecurity - Food Insecurity Present (05/26/2023)   Received from [facility]  Housing: Low Risk  (06/18/2023)  Transportation Needs: No Transportation Needs (06/18/2023)  Utilities: Not At Risk (06/18/2023)  Recent Concern: Utilities - At Risk (05/26/2023)   Received from [facility]  Alcohol Screen: Low Risk  (09/19/2021)  Depression (PHQ2-9): Low Risk  (08/22/2022)  Recent Concern: Depression (PHQ2-9) - Medium Risk (07/21/2022)  Financial Resource Strain: High Risk (05/26/2023)   Received from [facility]  Physical Activity: Sufficiently Active (09/19/2021)  Social Connections: Moderately Integrated (09/19/2021)  Stress: Stress Concern Present (09/19/2021)  Tobacco Use: Low Risk  (06/22/2023)    Readmission Risk Interventions     No data to display

## 2023-06-22 NOTE — Progress Notes (Signed)
Physical Therapy Treatment Patient Details Name: Teresa [name] MRN: [id number] DOB: 1946/07/24 Today's Date: 06/22/2023   History of Present Illness Teresa [name] is a pleasant 77 y.o. 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 S/p L4-5 lateral lumbar interbody fusion on 06/18/23.    PT Comments  Pt was long sitting in bed upon arrival. She is A and O x 4. Agreeable to session but did endorse severe nausea yesterday pre/post BM. Pt does c/o pain but overall tolerated session well. She requires min assist to exit bed. Can stand from standard height surfaces with min assist but CGA for elevated surface heights.  She will continue to benefit from skilled PT at DC to maximize her independence and safety with all ADLs. DC recs remain for STR since pt does not have available assistance at home.    If plan is discharge home, recommend the following: Help with stairs or ramp for entrance;Assist for transportation;Assistance with cooking/housework;A lot of help with walking and/or transfers;A lot of help with bathing/dressing/bathroom     Equipment Recommendations  Other (comment) (Defer to next level of care)       Precautions / Restrictions Precautions Precautions: Back;Fall Precaution Booklet Issued: No Restrictions Weight Bearing Restrictions: No Other Position/Activity Restrictions: No brace needed     Mobility  Bed Mobility Overal bed mobility: Needs Assistance Bed Mobility: Rolling, Sidelying to Sit, Supine to Sit Rolling: Min assist Sidelying to sit: Min assist Supine to sit: Min assist     Transfers Overall transfer level: Needs assistance Equipment used: Rolling walker (2 wheels) Transfers: Sit to/from Stand Sit to Stand: Contact guard assist, Min assist  General transfer comment: CGA for slightly elevated surfaced but min assist form recliner height     Ambulation/Gait Ambulation/Gait assistance: Contact guard assist Gait Distance (Feet): 25 Feet Assistive device: Rolling walker (2 wheels) Gait Pattern/deviations: Step-through pattern, Decreased step length - right, Decreased step length - left, Decreased dorsiflexion - right, Trunk flexed Gait velocity: decreased  General Gait Details: Pt was able to ambulate 25 ft with RW with antalgic/cautious gait pattern. NO LOB. limited by pain moreso than strength    Balance Overall balance assessment: Needs assistance Sitting-balance support: Feet supported, Bilateral upper extremity supported Sitting balance-Leahy Scale: Good     Standing balance support: Bilateral upper extremity supported Standing balance-Leahy Scale: Good Standing balance comment: no LOB or unsteadiness with BUE support       Cognition Arousal: Alert Behavior During Therapy: WFL for tasks assessed/performed Overall Cognitive Status: Within Functional Limits for tasks assessed    General Comments: Pt is A and O x 4               Pertinent Vitals/Pain Pain Assessment Pain Assessment: 0-10 Pain Score: 7  Pain Location: B glutes, surgical site,lower back Pain Descriptors / Indicators: Grimacing, Guarding, Radiating, Shooting, Sharp Pain Intervention(s): Limited activity within patient's tolerance, Monitored during session, Premedicated before session, Repositioned, Ice applied     PT Goals (current goals can now be found in the care plan section) Acute Rehab PT Goals Patient Stated Goal: "Get better and then return home." Progress towards PT goals: Progressing toward goals    Frequency    7X/week       AM-PAC PT "6 Clicks" Mobility   Outcome Measure  Help needed turning from your back to your side while in a flat bed without using bedrails?:  A Little Help needed moving from lying on your back to sitting on the side of a flat bed without using bedrails?: A Little Help needed moving to and from a  bed to a chair (including a wheelchair)?: A Little Help needed standing up from a chair using your arms (e.g., wheelchair or bedside chair)?: A Little Help needed to walk in hospital room?: A Little Help needed climbing 3-5 steps with a railing? : A Little 6 Click Score: 18    End of Session   Activity Tolerance: Patient tolerated treatment well Patient left: in chair;with nursing/sitter in room;with chair alarm set;with call bell/phone within reach Nurse Communication: Mobility status PT Visit Diagnosis: Muscle weakness (generalized) (M62.81);Pain;Other abnormalities of gait and mobility (R26.89);Difficulty in walking, not elsewhere classified (R26.2)     Time: 1610-9604 PT Time Calculation (min) (ACUTE ONLY): 26 min  Charges:    $Gait Training: 8-22 mins $Therapeutic Activity: 8-22 mins PT General Charges $$ ACUTE PT VISIT: 1 Visit                     Jetta Lout PTA 06/22/23, 8:49 AM

## 2023-06-22 NOTE — Discharge Summary (Signed)
Discharge Summary  Patient ID: Teresa [name] MRN: [id number] DOB/AGE: [birth year] 77 y.o.  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  Her intraoperative course was largely uncomplicated. She was admitted for pain control and therapy evaluation.  She experienced postop right proximal leg weakness likely due to psoas irritation.  Unfortunately due to her history of postoperative infection and diabetes, the risks of multiple doses of postop steroids outweighed the benefit.  Her weakness is largely unchanged throughout her hospital course however was limited due to pain.  She also experienced some nausea on postop day 3 that improved with her home doses of Zofran and Phenergan as this is a longstanding problem for her.  She was seen evaluated by therapy and deemed appropriate for discharge to skilled nursing facility on postop day 4.  Consults: None  Significant Diagnostic Studies: see results review   Treatments: surgery: as above.  Please see separately dictated operative report for further details.  Discharge Exam: Blood pressure 113/65, pulse (!) 57, temperature 97.9 F (36.6 C), resp. rate 18, height 5\' 2"  (1.575 m), weight 83.9 kg, SpO2 94%. AA Ox3 CNI   Strength:5/5 throughout BLE except 3/5 right HF and KE   Incisions c/d/I.  Diffuse ecchymosis throughout her lower back and right hip.  No palpable fluid collection or  hematoma.  Disposition: Discharge disposition: 03-Skilled Nursing Facility       Discharge Instructions     Incentive spirometry RT   Complete by: As directed       Allergies as of 06/22/2023       Reactions   Clarithromycin Nausea And Vomiting, Other (See Comments)   Codeine    Hallucination   Dilaudid  [hydromorphone Hcl] Nausea And Vomiting   Iodine Hives, Itching   Iohexol Hives, Itching   Onion Diarrhea, Nausea And Vomiting   Abdominal pain   Povidone-iodine Itching   Tamiflu  [oseltamivir Phosphate] Nausea And Vomiting   Abdominal Pain,   Zolpidem Nausea And Vomiting, Other (See Comments)   Hallucinations   Bacitracin-neomycin-polymyxin Itching, Rash   Benzalkonium Chloride Itching, Rash, Swelling   Lidocaine Hcl Itching, Rash, Swelling   Morphine Nausea And Vomiting, Rash   Neomycin-bacitracin Zn-polymyx Itching, Rash   Tape Itching, Rash   Adhesive tape - silicone   Tapentadol Rash        Medication List     STOP taking these medications    apixaban 5 MG Tabs tablet Commonly known as: ELIQUIS   traMADol 50 MG tablet Commonly known as: ULTRAM       TAKE these medications    acetaminophen 500 MG tablet Commonly known as: TYLENOL Take 1,000 mg by mouth every 6 (six) hours as needed for mild pain or headache.   albuterol 108 (90 Base) MCG/ACT inhaler Commonly known as: VENTOLIN HFA Inhale 2 puffs into the lungs every 6 (six) hours as needed for shortness of breath.   allopurinol 100 MG tablet Commonly known as: ZYLOPRIM Take 1 tablet (100 mg total)  by mouth daily. What changed: when to take this   atorvastatin 10 MG tablet Commonly known as: LIPITOR TAKE ONE TABLET BY MOUTH EVERYDAY AT BEDTIME   celecoxib 200 MG capsule Commonly known as: CELEBREX Take 1 capsule (200 mg total) by mouth 2 (two) times daily.   donepezil 5 MG tablet Commonly known as: ARICEPT Take 1 tablet (5 mg total) by mouth at bedtime.   empagliflozin 25 MG  Tabs tablet Commonly known as: Jardiance Take 1 tablet (25 mg total) by mouth daily.   FreeStyle Libre 2 Sensor Misc 1 each by Does not apply route every 14 (fourteen) days.   furosemide 40 MG tablet Commonly known as: LASIX Take 1 tablet (40 mg total) by mouth daily.   gabapentin 800 MG tablet Commonly known as: NEURONTIN Take 1 tablet (800 mg total) by mouth 3 (three) times daily.   glipiZIDE 5 MG tablet Commonly known as: GLUCOTROL Take 2.5 mg by mouth daily before breakfast.   levothyroxine 88 MCG tablet Commonly known as: SYNTHROID TAKE 1 TABLET BY MOUTH EVERY DAY   metFORMIN 1000 MG tablet Commonly known as: GLUCOPHAGE Take 1 tablet (1,000 mg total) by mouth 2 (two) times daily.   methocarbamol 500 MG tablet Commonly known as: ROBAXIN Take 1 tablet (500 mg total) by mouth every 6 (six) hours.   nystatin cream Commonly known as: MYCOSTATIN Apply 1 Application topically 2 (two) times daily as needed (skin irritation/rash.). What changed: Another medication with the same name was changed. Make sure you understand how and when to take each.   nystatin powder Commonly known as: MYCOSTATIN/NYSTOP Apply 1 Application topically 3 (three) times daily. What changed:  when to take this reasons to take this   ondansetron 4 MG tablet Commonly known as: ZOFRAN TAKE 1 TABLET BY MOUTH EVERY 8 HOURS AS NEEDED FOR NAUSEA AND VOMITING   oxyCODONE 5 MG immediate release tablet Commonly known as: Oxy IR/ROXICODONE Take 1-2 tablets (5-10 mg total) by mouth every 4 (four) hours as needed for moderate pain (pain score 4-6) ((score 4 to 6)).   Ozempic (2 MG/DOSE) 8 MG/3ML Sopn Generic drug: Semaglutide (2 MG/DOSE) Inject 2 mg into the skin every Sunday.   pioglitazone 15 MG tablet Commonly known as: ACTOS Take 1 tablet (15 mg total) by mouth every morning.   potassium chloride 10 MEQ tablet Commonly known as: KLOR-CON Take 1 tablet (10 mEq total) by mouth daily for 12  days.   promethazine 12.5 MG tablet Commonly known as: PHENERGAN Take 1 tablet (12.5 mg total) by mouth every 6 (six) hours as needed for nausea or vomiting.   senna 8.6 MG Tabs tablet Commonly known as: SENOKOT Take 1 tablet (8.6 mg total) by mouth 2 (two) times daily.   sertraline 50 MG tablet Commonly known as: ZOLOFT Take 1 tablet (50 mg total) by mouth daily. What changed: when to take this   Trelegy Ellipta 200-62.5-25 MCG/ACT Aepb Generic drug: Fluticasone-Umeclidin-Vilant Inhale 1 puff into the lungs daily.        Contact information for follow-up providers     Drake Leach, PA-C Follow up on 06/28/2023.   Specialty: Neurosurgery Contact information: [address] [place] [address] [phone]              Contact information for after-discharge care     Destination     [facility] .   Service: Skilled Paramedic information: [address] [place] [address] [phone]  Signed: Susanne Borders 06/22/2023, 8:43 AM

## 2023-06-22 NOTE — Care Management Important Message (Signed)
Important Message  Patient Details  Name: Teresa [name] MRN: [id number] Date of Birth: December 02, 1945   Important Message Given:  Yes - Medicare IM     Verita Schneiders Ronin Rehfeldt 06/22/2023, 9:56 AM

## 2023-06-22 NOTE — Progress Notes (Signed)
   Neurosurgery Progress Note  History: Teresa [name] is s/p L4-5 XLIF and PSD  POD4: Pt worked well with PT this morning. Continues to have right leg weakness that is unchanged.  POD3: continued back pain and right thigh weakness. Unchanged POD2: continues right buttock and anterior thigh and groin pain with associated weakness.  POD1: Pt complaining of ongoing right buttock pain and soreness in right anterior thigh and groin   Physical Exam: Vitals:   06/21/23 2136 06/22/23 0809  BP: 109/65 113/65  Pulse: 64 (!) 57  Resp: 20 18  Temp: 98.1 F (36.7 C) 97.9 F (36.6 C)  SpO2: 95% 94%    AA Ox3 CNI  Strength:5/5 throughout BLE except 3/5 right HF and KE  Incisions c/d/I.  Diffuse ecchymosis throughout her lower back and right hip.  No palpable fluid collection or hematoma.  Assessment/Plan:  Teresa [name] is a 77 y.o presenting with lumbar stenosis and adjacent segment disease s/p L4-5 XLIF and PSF.   - mobilize - pain control; will hold off on additional steroids given patients blood sugars and hs of previous post-op infection - dressings removed this morning.  - recommended ice for bruising and ongoing back pain. -Patient had BM yesterday.   - DVT prophylaxis - Diabetic coordinator consults. We appreciate your recommendations. continue to monitor elevated blood sugars  - PTOT; plan for SNF at d/c.   Manning Charity PA-C Department of Neurosurgery

## 2023-06-25 DIAGNOSIS — J449 Chronic obstructive pulmonary disease, unspecified: Secondary | ICD-10-CM | POA: Diagnosis not present

## 2023-06-25 DIAGNOSIS — I119 Hypertensive heart disease without heart failure: Secondary | ICD-10-CM | POA: Diagnosis not present

## 2023-06-25 DIAGNOSIS — M4326 Fusion of spine, lumbar region: Secondary | ICD-10-CM | POA: Diagnosis not present

## 2023-06-25 DIAGNOSIS — E039 Hypothyroidism, unspecified: Secondary | ICD-10-CM | POA: Diagnosis not present

## 2023-06-25 DIAGNOSIS — I2510 Atherosclerotic heart disease of native coronary artery without angina pectoris: Secondary | ICD-10-CM | POA: Diagnosis not present

## 2023-06-25 DIAGNOSIS — E119 Type 2 diabetes mellitus without complications: Secondary | ICD-10-CM | POA: Diagnosis not present

## 2023-06-25 DIAGNOSIS — I10 Essential (primary) hypertension: Secondary | ICD-10-CM | POA: Diagnosis not present

## 2023-06-25 DIAGNOSIS — I5032 Chronic diastolic (congestive) heart failure: Secondary | ICD-10-CM | POA: Diagnosis not present

## 2023-06-27 ENCOUNTER — Encounter: Payer: Self-pay | Admitting: Neurosurgery

## 2023-06-27 DIAGNOSIS — M4326 Fusion of spine, lumbar region: Secondary | ICD-10-CM | POA: Diagnosis not present

## 2023-06-27 DIAGNOSIS — E119 Type 2 diabetes mellitus without complications: Secondary | ICD-10-CM | POA: Diagnosis not present

## 2023-06-27 DIAGNOSIS — I5032 Chronic diastolic (congestive) heart failure: Secondary | ICD-10-CM | POA: Diagnosis not present

## 2023-06-27 DIAGNOSIS — R29898 Other symptoms and signs involving the musculoskeletal system: Secondary | ICD-10-CM | POA: Diagnosis not present

## 2023-06-27 NOTE — Progress Notes (Unsigned)
   REFERRING PHYSICIAN:  Bosie Clos, Md [address] [place],  [place] [address]  DOS: 06/18/23  L4-L5 XLIF and posterior spinal fusion   HISTORY OF PRESENT ILLNESS: Teresa [name] is approximately 2 weeks status post above surgery. Was given celebrex, robaxin, and oxycodone on discharge from the hospital.   She had some right proximal leg weakness postop likely due to psoas irritation- steroids not done due to history of postop infection and diabetes.   She continues with LBP and right > left leg pain. She has weakness in right leg. She is in PT. She is taking oxycodone, robaxin. SNF has her on mobic as well. Does not look like she has restarted her eliquis yet.    PHYSICAL EXAMINATION:  General: Patient is well developed, well nourished, calm, collected, and in no apparent distress.   NEUROLOGICAL:  General: In no acute distress.   Awake, alert, oriented to person, place, and time.  Pupils equal round and reactive to light.  Facial tone is symmetric.     Strength:            Side Iliopsoas Quads Hamstring PF DF EHL  R 3 3 4+ 4+ 4+ 4+  L 5 5 5 5 5 5    Incisions c/d/i   ROS (Neurologic):  Negative except as noted above  IMAGING: Nothing new to review.   ASSESSMENT/PLAN:  Teresa [name] is doing fair s/p above surgery. Patient reviewed with Dr. Myer Haff. Treatment options reviewed with patient and following plan made:   - I have advised the patient to lift up to 10 pounds until 6 weeks after surgery (follow up with Dr. Myer Haff).  - Reviewed wound care.  - No bending, twisting, or lifting.  - Continue with PT at facility.  - Continue on current medications including prn oxycodone and robaxin.  - Stop meloxicam. She cannot have NSAIDs until 3 months postop.   - Okay to restart celebrex 200mg  bid.  - Can restart eliquis 14 days postop on 07/02/23.  - Follow up as scheduled in 4 weeks and prn. Will need to show up 1 and 1/2 hours early to get xrays at [facility].    Advised to contact the office if any questions or concerns arise.  Drake Leach PA-C Department of neurosurgery

## 2023-06-28 ENCOUNTER — Encounter: Payer: Self-pay | Admitting: Orthopedic Surgery

## 2023-06-28 ENCOUNTER — Ambulatory Visit (INDEPENDENT_AMBULATORY_CARE_PROVIDER_SITE_OTHER): Payer: PPO | Admitting: Orthopedic Surgery

## 2023-06-28 VITALS — BP 108/62 | Temp 98.1°F | Ht 62.0 in | Wt 185.0 lb

## 2023-06-28 DIAGNOSIS — Z981 Arthrodesis status: Secondary | ICD-10-CM

## 2023-06-28 DIAGNOSIS — M48061 Spinal stenosis, lumbar region without neurogenic claudication: Secondary | ICD-10-CM

## 2023-06-28 DIAGNOSIS — M51369 Other intervertebral disc degeneration, lumbar region without mention of lumbar back pain or lower extremity pain: Secondary | ICD-10-CM

## 2023-06-28 NOTE — Patient Instructions (Signed)
It was nice to see you today.   I am sorry you are not feeling better yet.   Okay to get incision wet in the shower, do not submerge in pool or hot tub.   Call if any concerns about the incision such as redness, drainage, or fever/chills.   No bending, twisting, or lifting. You can lift up to 10 pounds until your follow up with Dr.  Myer Haff in 4 weeks.   Continue with PT at facility.   Stop the meloxicam. They can restart celebrex 200mg  twice a day.   Okay to restart eliquis on 07/02/23.   We will see you back in 4 weeks for your 6 weeks postop visit. Will need to get xrays prior.   Please call with any questions or concerns.   Drake Leach PA-C [phone]     The physicians and staff at [facility] at [facility] are committed to providing excellent care. You may receive a survey asking for feedback about your experience at our office. We value you your feedback and appreciate you taking the time to to fill it out. The [facility] leadership team is also available to discuss your experience in person, feel free to contact us [phone].

## 2023-07-02 DIAGNOSIS — T8149XA Infection following a procedure, other surgical site, initial encounter: Secondary | ICD-10-CM | POA: Diagnosis not present

## 2023-07-02 DIAGNOSIS — E119 Type 2 diabetes mellitus without complications: Secondary | ICD-10-CM | POA: Diagnosis not present

## 2023-07-02 DIAGNOSIS — I2510 Atherosclerotic heart disease of native coronary artery without angina pectoris: Secondary | ICD-10-CM | POA: Diagnosis not present

## 2023-07-02 DIAGNOSIS — R1311 Dysphagia, oral phase: Secondary | ICD-10-CM | POA: Diagnosis not present

## 2023-07-02 DIAGNOSIS — E039 Hypothyroidism, unspecified: Secondary | ICD-10-CM | POA: Diagnosis not present

## 2023-07-02 DIAGNOSIS — I10 Essential (primary) hypertension: Secondary | ICD-10-CM | POA: Diagnosis not present

## 2023-07-02 DIAGNOSIS — M4316 Spondylolisthesis, lumbar region: Secondary | ICD-10-CM | POA: Diagnosis not present

## 2023-07-03 DIAGNOSIS — M4326 Fusion of spine, lumbar region: Secondary | ICD-10-CM | POA: Diagnosis not present

## 2023-07-03 DIAGNOSIS — T8149XA Infection following a procedure, other surgical site, initial encounter: Secondary | ICD-10-CM | POA: Diagnosis not present

## 2023-07-09 DIAGNOSIS — M4316 Spondylolisthesis, lumbar region: Secondary | ICD-10-CM | POA: Diagnosis not present

## 2023-07-09 DIAGNOSIS — R1311 Dysphagia, oral phase: Secondary | ICD-10-CM | POA: Diagnosis not present

## 2023-07-11 DIAGNOSIS — R1311 Dysphagia, oral phase: Secondary | ICD-10-CM | POA: Diagnosis not present

## 2023-07-11 DIAGNOSIS — M4316 Spondylolisthesis, lumbar region: Secondary | ICD-10-CM | POA: Diagnosis not present

## 2023-07-12 DIAGNOSIS — M4326 Fusion of spine, lumbar region: Secondary | ICD-10-CM | POA: Diagnosis not present

## 2023-07-12 DIAGNOSIS — E039 Hypothyroidism, unspecified: Secondary | ICD-10-CM | POA: Diagnosis not present

## 2023-07-12 DIAGNOSIS — E119 Type 2 diabetes mellitus without complications: Secondary | ICD-10-CM | POA: Diagnosis not present

## 2023-07-12 DIAGNOSIS — I5032 Chronic diastolic (congestive) heart failure: Secondary | ICD-10-CM | POA: Diagnosis not present

## 2023-07-12 DIAGNOSIS — I10 Essential (primary) hypertension: Secondary | ICD-10-CM | POA: Diagnosis not present

## 2023-07-12 DIAGNOSIS — I2510 Atherosclerotic heart disease of native coronary artery without angina pectoris: Secondary | ICD-10-CM | POA: Diagnosis not present

## 2023-07-14 DIAGNOSIS — R112 Nausea with vomiting, unspecified: Secondary | ICD-10-CM | POA: Diagnosis not present

## 2023-07-14 DIAGNOSIS — Z20822 Contact with and (suspected) exposure to covid-19: Secondary | ICD-10-CM | POA: Diagnosis not present

## 2023-07-14 DIAGNOSIS — M791 Myalgia, unspecified site: Secondary | ICD-10-CM | POA: Diagnosis not present

## 2023-07-14 DIAGNOSIS — B349 Viral infection, unspecified: Secondary | ICD-10-CM | POA: Diagnosis not present

## 2023-07-15 ENCOUNTER — Other Ambulatory Visit: Payer: Self-pay

## 2023-07-15 ENCOUNTER — Inpatient Hospital Stay
Admission: EM | Admit: 2023-07-15 | Discharge: 2023-07-19 | DRG: 392 | Disposition: A | Discharge status: Home Health | Payer: PPO | Attending: Internal Medicine | Admitting: Internal Medicine

## 2023-07-15 ENCOUNTER — Emergency Department: Payer: PPO

## 2023-07-15 DIAGNOSIS — J42 Unspecified chronic bronchitis: Secondary | ICD-10-CM | POA: Diagnosis not present

## 2023-07-15 DIAGNOSIS — K529 Noninfective gastroenteritis and colitis, unspecified: Principal | ICD-10-CM | POA: Diagnosis present

## 2023-07-15 DIAGNOSIS — Z8249 Family history of ischemic heart disease and other diseases of the circulatory system: Secondary | ICD-10-CM

## 2023-07-15 DIAGNOSIS — Z825 Family history of asthma and other chronic lower respiratory diseases: Secondary | ICD-10-CM

## 2023-07-15 DIAGNOSIS — Z8701 Personal history of pneumonia (recurrent): Secondary | ICD-10-CM

## 2023-07-15 DIAGNOSIS — Z79899 Other long term (current) drug therapy: Secondary | ICD-10-CM

## 2023-07-15 DIAGNOSIS — E1140 Type 2 diabetes mellitus with diabetic neuropathy, unspecified: Secondary | ICD-10-CM | POA: Diagnosis present

## 2023-07-15 DIAGNOSIS — I10 Essential (primary) hypertension: Secondary | ICD-10-CM | POA: Diagnosis present

## 2023-07-15 DIAGNOSIS — R110 Nausea: Secondary | ICD-10-CM

## 2023-07-15 DIAGNOSIS — Z87442 Personal history of urinary calculi: Secondary | ICD-10-CM

## 2023-07-15 DIAGNOSIS — R413 Other amnesia: Secondary | ICD-10-CM | POA: Diagnosis present

## 2023-07-15 DIAGNOSIS — I480 Paroxysmal atrial fibrillation: Secondary | ICD-10-CM | POA: Diagnosis not present

## 2023-07-15 DIAGNOSIS — M349 Systemic sclerosis, unspecified: Secondary | ICD-10-CM | POA: Diagnosis present

## 2023-07-15 DIAGNOSIS — M48061 Spinal stenosis, lumbar region without neurogenic claudication: Secondary | ICD-10-CM | POA: Diagnosis present

## 2023-07-15 DIAGNOSIS — G4733 Obstructive sleep apnea (adult) (pediatric): Secondary | ICD-10-CM | POA: Diagnosis present

## 2023-07-15 DIAGNOSIS — N200 Calculus of kidney: Secondary | ICD-10-CM | POA: Diagnosis not present

## 2023-07-15 DIAGNOSIS — I2510 Atherosclerotic heart disease of native coronary artery without angina pectoris: Secondary | ICD-10-CM | POA: Diagnosis present

## 2023-07-15 DIAGNOSIS — J449 Chronic obstructive pulmonary disease, unspecified: Secondary | ICD-10-CM | POA: Diagnosis present

## 2023-07-15 DIAGNOSIS — E039 Hypothyroidism, unspecified: Secondary | ICD-10-CM | POA: Diagnosis present

## 2023-07-15 DIAGNOSIS — Z7901 Long term (current) use of anticoagulants: Secondary | ICD-10-CM

## 2023-07-15 DIAGNOSIS — I5032 Chronic diastolic (congestive) heart failure: Secondary | ICD-10-CM | POA: Diagnosis not present

## 2023-07-15 DIAGNOSIS — I4891 Unspecified atrial fibrillation: Secondary | ICD-10-CM | POA: Diagnosis present

## 2023-07-15 DIAGNOSIS — Z803 Family history of malignant neoplasm of breast: Secondary | ICD-10-CM

## 2023-07-15 DIAGNOSIS — Z79890 Hormone replacement therapy (postmenopausal): Secondary | ICD-10-CM

## 2023-07-15 DIAGNOSIS — R81 Glycosuria: Secondary | ICD-10-CM | POA: Diagnosis present

## 2023-07-15 DIAGNOSIS — E785 Hyperlipidemia, unspecified: Secondary | ICD-10-CM | POA: Diagnosis present

## 2023-07-15 DIAGNOSIS — Z8673 Personal history of transient ischemic attack (TIA), and cerebral infarction without residual deficits: Secondary | ICD-10-CM

## 2023-07-15 DIAGNOSIS — Z823 Family history of stroke: Secondary | ICD-10-CM

## 2023-07-15 DIAGNOSIS — E8729 Other acidosis: Secondary | ICD-10-CM

## 2023-07-15 DIAGNOSIS — I4510 Unspecified right bundle-branch block: Secondary | ICD-10-CM | POA: Diagnosis present

## 2023-07-15 DIAGNOSIS — K5730 Diverticulosis of large intestine without perforation or abscess without bleeding: Secondary | ICD-10-CM | POA: Diagnosis not present

## 2023-07-15 DIAGNOSIS — Z8616 Personal history of COVID-19: Secondary | ICD-10-CM

## 2023-07-15 DIAGNOSIS — Z7984 Long term (current) use of oral hypoglycemic drugs: Secondary | ICD-10-CM

## 2023-07-15 DIAGNOSIS — Z833 Family history of diabetes mellitus: Secondary | ICD-10-CM

## 2023-07-15 DIAGNOSIS — Z6833 Body mass index (BMI) 33.0-33.9, adult: Secondary | ICD-10-CM

## 2023-07-15 DIAGNOSIS — E872 Acidosis, unspecified: Secondary | ICD-10-CM | POA: Diagnosis not present

## 2023-07-15 DIAGNOSIS — E669 Obesity, unspecified: Secondary | ICD-10-CM | POA: Diagnosis present

## 2023-07-15 DIAGNOSIS — E876 Hypokalemia: Secondary | ICD-10-CM | POA: Diagnosis not present

## 2023-07-15 DIAGNOSIS — M109 Gout, unspecified: Secondary | ICD-10-CM | POA: Diagnosis present

## 2023-07-15 DIAGNOSIS — Z8261 Family history of arthritis: Secondary | ICD-10-CM

## 2023-07-15 DIAGNOSIS — R112 Nausea with vomiting, unspecified: Principal | ICD-10-CM

## 2023-07-15 DIAGNOSIS — I110 Hypertensive heart disease with heart failure: Secondary | ICD-10-CM | POA: Diagnosis present

## 2023-07-15 DIAGNOSIS — Z90710 Acquired absence of both cervix and uterus: Secondary | ICD-10-CM

## 2023-07-15 DIAGNOSIS — R197 Diarrhea, unspecified: Secondary | ICD-10-CM | POA: Diagnosis not present

## 2023-07-15 DIAGNOSIS — T730XXA Starvation, initial encounter: Secondary | ICD-10-CM

## 2023-07-15 DIAGNOSIS — R824 Acetonuria: Secondary | ICD-10-CM | POA: Diagnosis present

## 2023-07-15 DIAGNOSIS — J4489 Other specified chronic obstructive pulmonary disease: Secondary | ICD-10-CM | POA: Diagnosis present

## 2023-07-15 DIAGNOSIS — I272 Pulmonary hypertension, unspecified: Secondary | ICD-10-CM | POA: Diagnosis present

## 2023-07-15 DIAGNOSIS — Z800 Family history of malignant neoplasm of digestive organs: Secondary | ICD-10-CM

## 2023-07-15 LAB — COMPREHENSIVE METABOLIC PANEL
ALT: 18 U/L (ref 0–44)
AST: 21 U/L (ref 15–41)
Albumin: 3.9 g/dL (ref 3.5–5.0)
Alkaline Phosphatase: 126 U/L (ref 38–126)
Anion gap: 16 — ABNORMAL HIGH (ref 5–15)
BUN: 9 mg/dL (ref 8–23)
CO2: 18 mmol/L — ABNORMAL LOW (ref 22–32)
Calcium: 8.8 mg/dL — ABNORMAL LOW (ref 8.9–10.3)
Chloride: 104 mmol/L (ref 98–111)
Creatinine, Ser: 0.53 mg/dL (ref 0.44–1.00)
GFR, Estimated: >60 mL/min (ref >60)
Glucose, Bld: 151 mg/dL — ABNORMAL HIGH (ref 70–99)
Potassium: 3.1 mmol/L — ABNORMAL LOW (ref 3.5–5.1)
Sodium: 138 mmol/L (ref 135–145)
Total Bilirubin: 1.8 mg/dL — ABNORMAL HIGH (ref <1.2)
Total Protein: 7.5 g/dL (ref 6.5–8.1)

## 2023-07-15 LAB — CBC
HCT: 40.3 % (ref 36.0–46.0)
Hemoglobin: 13.3 g/dL (ref 12.0–15.0)
MCH: 30.6 pg (ref 26.0–34.0)
MCHC: 33 g/dL (ref 30.0–36.0)
MCV: 92.9 fL (ref 80.0–100.0)
Platelets: 356 10*3/uL (ref 150–400)
RBC: 4.34 MIL/uL (ref 3.87–5.11)
RDW: 15.7 % — ABNORMAL HIGH (ref 11.5–15.5)
WBC: 11.4 10*3/uL — ABNORMAL HIGH (ref 4.0–10.5)
nRBC: 0 % (ref 0.0–0.2)

## 2023-07-15 LAB — LIPASE, BLOOD: Lipase: 34 U/L (ref 11–51)

## 2023-07-15 LAB — URINALYSIS, ROUTINE W REFLEX MICROSCOPIC
Glucose, UA: 500 mg/dL — AB
Hgb urine dipstick: NEGATIVE
Ketones, ur: >160 mg/dL — AB
Leukocytes,Ua: NEGATIVE
Nitrite: NEGATIVE
Protein, ur: NEGATIVE mg/dL
Specific Gravity, Urine: >1.03 — ABNORMAL HIGH (ref 1.005–1.030)
pH: 6 (ref 5.0–8.0)

## 2023-07-15 LAB — BASIC METABOLIC PANEL
Anion gap: 13 (ref 5–15)
BUN: 11 mg/dL (ref 8–23)
CO2: 19 mmol/L — ABNORMAL LOW (ref 22–32)
Calcium: 8.3 mg/dL — ABNORMAL LOW (ref 8.9–10.3)
Chloride: 109 mmol/L (ref 98–111)
Creatinine, Ser: 0.55 mg/dL (ref 0.44–1.00)
GFR, Estimated: >60 mL/min (ref >60)
Glucose, Bld: 145 mg/dL — ABNORMAL HIGH (ref 70–99)
Potassium: 3.2 mmol/L — ABNORMAL LOW (ref 3.5–5.1)
Sodium: 141 mmol/L (ref 135–145)

## 2023-07-15 LAB — BETA-HYDROXYBUTYRIC ACID: Beta-Hydroxybutyric Acid: 4.41 mmol/L — ABNORMAL HIGH (ref 0.05–0.27)

## 2023-07-15 LAB — LACTIC ACID, PLASMA: Lactic Acid, Venous: 1.2 mmol/L (ref 0.5–1.9)

## 2023-07-15 LAB — CBG MONITORING, ED
Glucose-Capillary: 132 mg/dL — ABNORMAL HIGH (ref 70–99)
Glucose-Capillary: 113 mg/dL — ABNORMAL HIGH (ref 70–99)

## 2023-07-15 MED ORDER — SODIUM CHLORIDE 0.9 % IV SOLN: INTRAVENOUS | Status: AC

## 2023-07-15 MED ORDER — FENTANYL CITRATE PF 50 MCG/ML IJ SOSY
12.5000 ug | PREFILLED_SYRINGE | INTRAMUSCULAR | Status: DC | PRN
  Administered 2023-07-17: 25 ug via INTRAVENOUS
  Administered 2023-07-17: 12.5 ug via INTRAVENOUS
  Administered 2023-07-17 – 2023-07-18 (×2): 25 ug via INTRAVENOUS
  Administered 2023-07-18: 12.5 ug via INTRAVENOUS
  Administered 2023-07-18 – 2023-07-19 (×5): 25 ug via INTRAVENOUS
  Filled 2023-07-15 (×11): qty 1

## 2023-07-15 MED ORDER — SODIUM CHLORIDE 0.9 % IV BOLUS
1000.0000 mL | Freq: Once | INTRAVENOUS | Status: AC
  Administered 2023-07-15: 1000 mL via INTRAVENOUS

## 2023-07-15 MED ORDER — ONDANSETRON HCL 4 MG/2ML IJ SOLN
4.0000 mg | Freq: Once | INTRAMUSCULAR | Status: AC
  Administered 2023-07-15: 4 mg via INTRAVENOUS
  Filled 2023-07-15: qty 2

## 2023-07-15 MED ORDER — LACTATED RINGERS IV BOLUS
1000.0000 mL | Freq: Once | INTRAVENOUS | Status: AC
  Administered 2023-07-15: 1000 mL via INTRAVENOUS

## 2023-07-15 MED ORDER — APIXABAN 5 MG PO TABS
5.0000 mg | ORAL_TABLET | Freq: Two times a day (BID) | ORAL | Status: DC
Start: 2023-07-16 — End: 2023-07-19
  Administered 2023-07-16 – 2023-07-19 (×7): 5 mg via ORAL
  Filled 2023-07-15 (×7): qty 1

## 2023-07-15 MED ORDER — POTASSIUM CHLORIDE 10 MEQ/100ML IV SOLN
10.0000 meq | INTRAVENOUS | Status: AC
  Administered 2023-07-15 (×3): 10 meq via INTRAVENOUS
  Filled 2023-07-15 (×4): qty 100

## 2023-07-15 MED ORDER — GABAPENTIN 400 MG PO CAPS
800.0000 mg | ORAL_CAPSULE | Freq: Two times a day (BID) | ORAL | Status: DC
  Administered 2023-07-15 – 2023-07-19 (×8): 800 mg via ORAL
  Filled 2023-07-15 (×9): qty 2

## 2023-07-15 MED ORDER — DONEPEZIL HCL 5 MG PO TABS
5.0000 mg | ORAL_TABLET | Freq: Every day | ORAL | Status: DC
  Administered 2023-07-15 – 2023-07-18 (×4): 5 mg via ORAL
  Filled 2023-07-15 (×4): qty 1

## 2023-07-15 MED ORDER — INSULIN ASPART 100 UNIT/ML IJ SOLN
0.0000 [IU] | Freq: Three times a day (TID) | INTRAMUSCULAR | Status: DC
  Administered 2023-07-16: 3 [IU] via SUBCUTANEOUS
  Administered 2023-07-17: 2 [IU] via SUBCUTANEOUS
  Administered 2023-07-17: 3 [IU] via SUBCUTANEOUS
  Administered 2023-07-17: 2 [IU] via SUBCUTANEOUS
  Administered 2023-07-18: 3 [IU] via SUBCUTANEOUS
  Administered 2023-07-18 – 2023-07-19 (×4): 2 [IU] via SUBCUTANEOUS
  Filled 2023-07-15 (×9): qty 1

## 2023-07-15 MED ORDER — SODIUM CHLORIDE 0.9 % IV SOLN
12.5000 mg | Freq: Four times a day (QID) | INTRAVENOUS | Status: DC | PRN
  Administered 2023-07-15 – 2023-07-19 (×6): 12.5 mg via INTRAVENOUS
  Filled 2023-07-15: qty 12.5
  Filled 2023-07-15: qty 0.5
  Filled 2023-07-15 (×3): qty 12.5
  Filled 2023-07-15: qty 0.5

## 2023-07-15 MED ORDER — NYSTATIN 100000 UNIT/GM EX POWD
Freq: Two times a day (BID) | CUTANEOUS | Status: DC
  Filled 2023-07-15 (×3): qty 15

## 2023-07-15 MED ORDER — IPRATROPIUM-ALBUTEROL 0.5-2.5 (3) MG/3ML IN SOLN: 3.0000 mL | RESPIRATORY_TRACT | Status: DC | PRN

## 2023-07-15 MED ORDER — GABAPENTIN 800 MG PO TABS: 800.0000 mg | ORAL_TABLET | Freq: Three times a day (TID) | ORAL | Status: DC

## 2023-07-15 MED ORDER — FLUTICASONE FUROATE-VILANTEROL 200-25 MCG/ACT IN AEPB
1.0000 | INHALATION_SPRAY | Freq: Every day | RESPIRATORY_TRACT | Status: DC
  Administered 2023-07-17 – 2023-07-19 (×3): 1 via RESPIRATORY_TRACT
  Filled 2023-07-15 (×2): qty 28

## 2023-07-15 MED ORDER — ALLOPURINOL 100 MG PO TABS
100.0000 mg | ORAL_TABLET | Freq: Every day | ORAL | Status: DC
  Administered 2023-07-15 – 2023-07-18 (×4): 100 mg via ORAL
  Filled 2023-07-15 (×5): qty 1

## 2023-07-15 MED ORDER — FENTANYL CITRATE PF 50 MCG/ML IJ SOSY
50.0000 ug | PREFILLED_SYRINGE | Freq: Once | INTRAMUSCULAR | Status: AC
  Administered 2023-07-15: 50 ug via INTRAVENOUS
  Filled 2023-07-15: qty 1

## 2023-07-15 MED ORDER — ACETAMINOPHEN 650 MG RE SUPP: 650.0000 mg | Freq: Four times a day (QID) | RECTAL | Status: DC | PRN

## 2023-07-15 MED ORDER — HYDROCORTISONE 1 % EX CREA: TOPICAL_CREAM | Freq: Two times a day (BID) | CUTANEOUS | Status: DC | PRN

## 2023-07-15 MED ORDER — SODIUM CHLORIDE 0.9% FLUSH
3.0000 mL | Freq: Two times a day (BID) | INTRAVENOUS | Status: DC
  Administered 2023-07-15 – 2023-07-19 (×8): 3 mL via INTRAVENOUS

## 2023-07-15 MED ORDER — PANTOPRAZOLE SODIUM 40 MG IV SOLR
40.0000 mg | Freq: Two times a day (BID) | INTRAVENOUS | Status: DC
  Administered 2023-07-15 – 2023-07-19 (×8): 40 mg via INTRAVENOUS
  Filled 2023-07-15 (×9): qty 10

## 2023-07-15 MED ORDER — ATORVASTATIN CALCIUM 10 MG PO TABS
10.0000 mg | ORAL_TABLET | Freq: Every day | ORAL | Status: DC
  Administered 2023-07-15 – 2023-07-18 (×4): 10 mg via ORAL
  Filled 2023-07-15 (×4): qty 1

## 2023-07-15 MED ORDER — SERTRALINE HCL 50 MG PO TABS
50.0000 mg | ORAL_TABLET | Freq: Every day | ORAL | Status: DC
  Administered 2023-07-15 – 2023-07-18 (×4): 50 mg via ORAL
  Filled 2023-07-15 (×4): qty 1

## 2023-07-15 MED ORDER — ACETAMINOPHEN 325 MG PO TABS
650.0000 mg | ORAL_TABLET | Freq: Four times a day (QID) | ORAL | Status: DC | PRN
  Administered 2023-07-19: 650 mg via ORAL
  Filled 2023-07-15: qty 2

## 2023-07-15 MED ORDER — LEVOTHYROXINE SODIUM 88 MCG PO TABS
88.0000 ug | ORAL_TABLET | Freq: Every day | ORAL | Status: DC
  Administered 2023-07-16 – 2023-07-19 (×4): 88 ug via ORAL
  Filled 2023-07-15 (×4): qty 1

## 2023-07-15 MED ORDER — UMECLIDINIUM BROMIDE 62.5 MCG/ACT IN AEPB
1.0000 | INHALATION_SPRAY | Freq: Every day | RESPIRATORY_TRACT | Status: DC
  Administered 2023-07-17 – 2023-07-19 (×3): 1 via RESPIRATORY_TRACT
  Filled 2023-07-15 (×2): qty 7

## 2023-07-15 MED ORDER — PROCHLORPERAZINE EDISYLATE 10 MG/2ML IJ SOLN
5.0000 mg | Freq: Once | INTRAMUSCULAR | Status: AC
  Administered 2023-07-15: 5 mg via INTRAVENOUS
  Filled 2023-07-15: qty 2

## 2023-07-15 MED ORDER — POTASSIUM CHLORIDE 10 MEQ/100ML IV SOLN
10.0000 meq | Freq: Once | INTRAVENOUS | Status: AC
Start: 2023-07-15 — End: 2023-07-15
  Administered 2023-07-15: 10 meq via INTRAVENOUS

## 2023-07-15 MED ORDER — LACTATED RINGERS IV SOLN: INTRAVENOUS | Status: DC

## 2023-07-15 NOTE — ED Provider Notes (Signed)
Procedures  Clinical Course as of 07/15/23 1756  Sun Jul 15, 2023  1428 Ongoing nausea and vomiting symptoms.  Given anion gap elevation suspect this is likely secondary to a viral GI bug with negative CT.  Will give fluids and additional nausea medication discussed case with hospitalist for admission [DW]    Clinical Course User Index [DW] Janith Lima, MD    ----------------------------------------- 5:56 PM on 07/15/2023 -----------------------------------------   After additional anti-emetics, pt still very nauseated. Failed PO trial. Case d/w hospitalist for further management.l   Sharman Cheek, MD 07/15/23 1756

## 2023-07-15 NOTE — ED Notes (Signed)
Patient given water and saltine crackers for PO challenge at this time.

## 2023-07-15 NOTE — ED Notes (Signed)
Bedtime meds order from pharmacy.

## 2023-07-15 NOTE — ED Notes (Signed)
Pt reclining on stretcher in nad and endorses nausea relieved at this time. Repositioned for comfort and call light within reach. Cardiac monitor displays SR and pt is on potassium IV for hypokalemic correction.

## 2023-07-15 NOTE — Assessment & Plan Note (Signed)
Patient appears euvolemic on examination with no evidence of pulmonary or peripheral edema.  Hold Lasix in the setting of intractable nausea and vomiting given high risk for AKI.  - Daily weights - Hold home regimen

## 2023-07-15 NOTE — Assessment & Plan Note (Signed)
No shortness of breath or wheezing reported.  - Continue home Trelegy - DuoNebs as needed

## 2023-07-15 NOTE — Assessment & Plan Note (Signed)
-   40 mill equivalents of IV potassium ordered - Recheck BMP in 8 hours

## 2023-07-15 NOTE — Assessment & Plan Note (Signed)
Bicarb of 18 with anion gap of 16 noted on admission.  Beta hydroxybutyrate acid is elevated, however given only mild decrease in bicarb, this is more consistent with starvation ketoacidosis rather than euglycemic DKA.  - IV fluids as ordered - Repeat BMP and BHA in 8 hours

## 2023-07-15 NOTE — Assessment & Plan Note (Signed)
-   Resume home antihypertensives tomorrow 

## 2023-07-15 NOTE — Assessment & Plan Note (Signed)
 Resume home Synthroid

## 2023-07-15 NOTE — ED Triage Notes (Signed)
C/O dry heaving since Friday night.  Patient recently discharged from [facility] on Thursday.  Seen through [facility] yesterday for same, tested negative for Covid/Flu.

## 2023-07-15 NOTE — H&P (Signed)
History and Physical    Patient: Teresa [name] OZH:[id number] DOB: Mar 20, 1946 DOA: 07/15/2023 DOS: the patient was seen and examined on 07/15/2023 PCP: Bosie Clos, MD  Patient coming from: Home  Chief Complaint: No chief complaint on file.  HPI: Teresa [name] is a 77 y.o. female with medical history significant of Type 2 diabetes, hypertension, hyperlipidemia, COPD, nonobstructive CAD, hypothyroidism, CVA, atrial fibrillation on Eliquis, OSA not on CPAP, who presents to the ED due to nausea and vomiting.  Teresa [name] states that on 12/6, she began to experience intractable nausea and vomiting, in addition to bilateral lower abdominal pain and diarrhea.  Diarrhea turned dark on 12/7.  She has not had any diarrheal episodes today.  She has not been able to hold any p.o. intake down other than small amounts of water.  She denies any chest pain, shortness of breath, cough, lower extremity swelling.  She denies any urinary symptoms.  She denies any fever, chills.  ED course: On arrival to the ED, patient was normotensive at 125/66 with heart rate of 59.  She was saturating at 94% on room air.  She was afebrile at 97.6.  Initial workup notable for WBC of 11.4, potassium 3.1, bicarb 18, anion gap 16, creatinine 0.53, GFR above 60.  Lactic acid within normal limits.  Beta hydroxybutyrate acid elevated at 4.41.  Urinalysis with glucosuria, ketonuria, and increased specific gravity.  CT of the abdomen was obtained with no acute findings.  Patient started on IV fluids and [facility] contacted for admission.  Review of Systems: As mentioned in the history of present illness. All other systems reviewed and are negative.  Past Medical History:  Diagnosis Date   (HFpEF) heart failure with preserved ejection fraction (HCC)    a.) TTE 09/22/2014: EF >55%, no RWMAs, mild MAC, normal RVSF, mild TR/PR, mod MR; b.) TTE 08/04/2019: EF >55%, no RWMAs, normal RVSF, triv MR/TR; c.) TTE 06/14/2021: EF 60-65%, no  RMWAs, normal RVSF, triv MR   Anginal pain (HCC)    Anxiety    Aortic atherosclerosis (HCC)    Arthritis    Asthma    Beta-blockers contraindicated    a.) significant bradycardia in the past while on therapy   Bilateral lower extremity edema    Cerebral microvascular disease    Chronic bilateral low back pain with bilateral sciatica    Complication of anesthesia    a.) PONV; b.) delayed emergence when she was younger   COPD (chronic obstructive pulmonary disease) (HCC)    Coronary artery disease    a.) cCTA 11/01/2021: Ca+ score 174 (67th percentile for age/sex match) --> FFR demonstrated no hemodynamically significant stenosis   Depression    Diverticulosis    DOE (dyspnea on exertion)    Dyspnea    GERD (gastroesophageal reflux disease)    a.) no daily Tx; s/p Nissen fundoplication   Gout    Heart murmur    History of 2019 novel coronavirus disease (COVID-19) 05/12/2019   History of hiatal hernia    a.) s/p Nissen fundoplication   History of kidney stones    History of orthopnea    HLD (hyperlipidemia)    Hypertension    Hypothyroidism    Lumbar stenosis 2024   Memory loss    a.) s/p "mini stroke" in 2021; b.) on acetylcholinesterase inhibitor (donepazil)   Migraines    Mobitz type I Wenckebach atrioventricular block    a.) holter study 01/27/2022   On apixaban therapy  PAF (paroxysmal atrial fibrillation) (HCC)    a.) CHA2DS2-VASc = 54 (age x2, sex, HFpEF, CVA x2, HTN, vascular disease history, T2DM) as of 06/14/2023;  b.) rate/rhythm maintained without pharmacological intervention; chronically anticoagulated using standard dose apixaban   Palpitations    Pneumonia due to COVID-19 virus 05/2019   PONV (postoperative nausea and vomiting)    PSVT (paroxysmal supraventricular tachycardia) (HCC) 01/27/2022   a.) holter study 01/27/2022 - longest lasting 17 beats at a rate of 109 bpm   Pulmonary HTN (HCC)    RBBB (right bundle branch block)    Scleroderma (HCC)     Sleep apnea    a.) does not require nocturnal PAP therapy   Stroke (HCC) 2021   a.) "mild" per patient; no findings on imaging; presented with transient aphasia, memory loss, extremity numbness   T2DM (type 2 diabetes mellitus) (HCC)    Transfusion of blood product refused for religious reason (Jehovah's witness)    Vertigo    Vitamin D deficiency    Past Surgical History:  Procedure Laterality Date   ABDOMINAL HYSTERECTOMY     ovaries intact   ANKLE ARTHROSCOPY Left 03/03/2022   Procedure: ANKLE ARTHROSCOPY;  Surgeon: Edwin Cap, DPM;  Location: [facility];  Service: Podiatry;  Laterality: Left;  GENERAL WITH POP AND SAPHENOUS BLOCK   ANTERIOR LATERAL LUMBAR FUSION WITH PERCUTANEOUS SCREW 1 LEVEL N/A 06/18/2023   Procedure: L4-5 LATERAL LUMBAR INTERBODY FUSION AND POSTERIOR SPINAL FUSION;  Surgeon: Venetia Night, MD;  Location: [facility];  Service: Neurosurgery;  Laterality: N/A;   APPLICATION OF INTRAOPERATIVE CT SCAN N/A 06/18/2023   Procedure: APPLICATION OF INTRAOPERATIVE CT SCAN;  Surgeon: Venetia Night, MD;  Location: [facility];  Service: Neurosurgery;  Laterality: N/A;   BLADDER SURGERY     bladder tuck   BREAST BIOPSY Right 03/20/2016   neg x 2 area   CARDIAC CATHETERIZATION     CARPAL TUNNEL RELEASE Bilateral    CATARACT EXTRACTION W/PHACO Left 09/17/2018   Procedure: CATARACT EXTRACTION PHACO AND INTRAOCULAR LENS PLACEMENT (IOC) LEFT, DIABETIC;  Surgeon: Galen Manila, MD;  Location: [facility];  Service: Ophthalmology;  Laterality: Left;  [place] 00:34 CDE 4.85 Fluid pack lot # [id number]   CATARACT EXTRACTION W/PHACO Right 10/15/2018   Procedure: CATARACT EXTRACTION PHACO AND INTRAOCULAR LENS PLACEMENT (IOC)-RIGHT;  Surgeon: Galen Manila, MD;  Location: [facility];  Service: Ophthalmology;  Laterality: Right;  [place] 00:27.6 CDE 3.43 Fluid Pack Lot # [id number]   COLONOSCOPY WITH PROPOFOL N/A 09/27/2018   Procedure: COLONOSCOPY WITH PROPOFOL;  Surgeon: Pasty Spillers, MD;  Location: [facility];  Service: Endoscopy;  Laterality: N/A;   DILATION AND CURETTAGE OF UTERUS     EYE SURGERY     eyelid   FRACTURE SURGERY     left ankle-plate and screws palced   GRAFT APPLICATION Left 03/03/2022   Procedure: BONE GRAFT;  Surgeon: Edwin Cap, DPM;  Location: [facility];  Service: Podiatry;  Laterality: Left;   HARDWARE REMOVAL Left 03/03/2022   Procedure: POSSIBLE HARDWARE REMOVAL;  Surgeon: Edwin Cap, DPM;  Location: [facility];  Service: Podiatry;  Laterality: Left;   LITHOTRIPSY     NISSEN FUNDOPLICATION     SHOULDER SURGERY Right    rotator cuff   TONSILLECTOMY     Social History:  reports that she has never smoked. She has never used smokeless tobacco. She reports that she does not drink alcohol and does not use drugs.  Allergies  Allergen Reactions  Clarithromycin Nausea And Vomiting and Other (See Comments)   Codeine     Hallucination   Dilaudid  [Hydromorphone Hcl] Nausea And Vomiting   Iodine Hives and Itching   Iohexol Hives and Itching   Onion Diarrhea and Nausea And Vomiting    Abdominal pain   Povidone-Iodine Itching   Tamiflu  [Oseltamivir Phosphate] Nausea And Vomiting    Abdominal Pain,   Zolpidem Nausea And Vomiting and Other (See Comments)    Hallucinations   Bacitracin-Neomycin-Polymyxin Itching and Rash   Benzalkonium Chloride Itching, Rash and Swelling   Lidocaine Hcl Itching, Rash and Swelling   Morphine Nausea And Vomiting and Rash   Neomycin-Bacitracin Zn-Polymyx Itching and Rash   Tape Itching and Rash    Adhesive tape - silicone   Tapentadol Rash    Family History  Problem Relation Age of Onset   Stroke Mother    Hypertension Mother    Heart disease Mother    Arthritis Mother    Heart disease Father    Hypertension Father    Diabetes Sister    Hypertension Sister    Asthma Sister    Hypertension Sister    Diverticulitis Sister    Colon cancer Maternal Grandmother    Breast cancer  Maternal Grandmother    Breast cancer Maternal Aunt     Prior to Admission medications   Medication Sig Start Date End Date Taking? Authorizing Provider  acetaminophen (TYLENOL) 500 MG tablet Take 1,000 mg by mouth every 6 (six) hours as needed for mild pain or headache.    [provider]  albuterol (VENTOLIN HFA) 108 (90 Base) MCG/ACT inhaler Inhale 2 puffs into the lungs every 6 (six) hours as needed for shortness of breath. 03/26/21   [provider]  allopurinol (ZYLOPRIM) 100 MG tablet Take 1 tablet (100 mg total) by mouth daily. Patient taking differently: Take 100 mg by mouth at bedtime. 07/06/22   Simmons-Robinson, Makiera, MD  atorvastatin (LIPITOR) 10 MG tablet TAKE ONE TABLET BY MOUTH EVERYDAY AT BEDTIME 04/13/22   Bosie Clos, MD  Continuous Blood Gluc Sensor (FREESTYLE LIBRE 2 SENSOR) MISC 1 each by Does not apply route every 14 (fourteen) days. 07/12/22   Caro Laroche, DO  donepezil (ARICEPT) 5 MG tablet Take 1 tablet (5 mg total) by mouth at bedtime. 07/12/22   Rumball, Darl Householder, DO  ELIQUIS 5 MG TABS tablet Take 5 mg by mouth 2 (two) times daily. 06/22/23   [provider]  empagliflozin (JARDIANCE) 25 MG TABS tablet Take 1 tablet (25 mg total) by mouth daily. 07/06/22   Simmons-Robinson, Makiera, MD  furosemide (LASIX) 40 MG tablet Take 1 tablet (40 mg total) by mouth daily. 07/06/22   Simmons-Robinson, Makiera, MD  gabapentin (NEURONTIN) 800 MG tablet Take 1 tablet (800 mg total) by mouth 3 (three) times daily. 07/06/22   Simmons-Robinson, Makiera, MD  glipiZIDE (GLUCOTROL) 5 MG tablet Take 2.5 mg by mouth daily before breakfast.    [provider]  levothyroxine (SYNTHROID) 88 MCG tablet TAKE 1 TABLET BY MOUTH EVERY DAY 08/23/21   Bosie Clos, MD  metFORMIN (GLUCOPHAGE) 1000 MG tablet Take 1 tablet (1,000 mg total) by mouth 2 (two) times daily. 07/06/22   Simmons-Robinson, Makiera, MD  methocarbamol (ROBAXIN) 500 MG tablet Take  1 tablet (500 mg total) by mouth every 6 (six) hours. 06/22/23   Susanne Borders, PA  nystatin (MYCOSTATIN/NYSTOP) powder Apply 1 Application topically 3 (three) times daily. Patient taking differently: Apply  1 Application topically 3 (three) times daily as needed (skin irritation/yeast.). 07/24/22   Drubel, Lillia Abed, PA-C  nystatin cream (MYCOSTATIN) Apply 1 Application topically 2 (two) times daily as needed (skin irritation/rash.).    [provider]  ondansetron (ZOFRAN) 4 MG tablet TAKE 1 TABLET BY MOUTH EVERY 8 HOURS AS NEEDED FOR NAUSEA AND VOMITING 08/21/19   Bosie Clos, MD  oxyCODONE (OXY IR/ROXICODONE) 5 MG immediate release tablet Take 1-2 tablets (5-10 mg total) by mouth every 4 (four) hours as needed for moderate pain (pain score 4-6) ((score 4 to 6)). 06/22/23   Susanne Borders, PA  OZEMPIC, 2 MG/DOSE, 8 MG/3ML SOPN Inject 2 mg into the skin every Sunday.    [provider]  pioglitazone (ACTOS) 15 MG tablet Take 1 tablet (15 mg total) by mouth every morning. 07/12/22   Caro Laroche, DO  potassium chloride (KLOR-CON) 10 MEQ tablet Take 1 tablet (10 mEq total) by mouth daily for 12 days. 06/06/23 06/18/23  Verlee Monte, NP  promethazine (PHENERGAN) 12.5 MG tablet Take 1 tablet (12.5 mg total) by mouth every 6 (six) hours as needed for nausea or vomiting. 07/21/22   Drubel, Lillia Abed, PA-C  senna (SENOKOT) 8.6 MG TABS tablet Take 1 tablet (8.6 mg total) by mouth 2 (two) times daily. 06/22/23   Susanne Borders, PA  sertraline (ZOLOFT) 50 MG tablet Take 1 tablet (50 mg total) by mouth daily. Patient taking differently: Take 50 mg by mouth at bedtime. 08/17/22   Alfredia Ferguson, PA-C  TRELEGY ELLIPTA 200-62.5-25 MCG/ACT AEPB Inhale 1 puff into the lungs daily. 06/08/23   [provider]    Physical Exam: Vitals:   07/15/23 1046 07/15/23 1355 07/15/23 1502 07/15/23 1703  BP: 125/66 129/60  (!) 118/51  Pulse: (!) 59 66  73  Resp: 20 18  18    Temp: 97.6 F (36.4 C)  98.4 F (36.9 C)   TempSrc: Oral  Oral   SpO2: 94% 97%  93%  Weight: 83.9 kg      Physical Exam Vitals and nursing note reviewed.  Constitutional:      General: She is not in acute distress.    Appearance: She is obese.  HENT:     Head: Normocephalic and atraumatic.     Mouth/Throat:     Mouth: Mucous membranes are dry.  Eyes:     Conjunctiva/sclera: Conjunctivae normal.     Pupils: Pupils are equal, round, and reactive to light.  Cardiovascular:     Rate and Rhythm: Normal rate and regular rhythm.     Heart sounds: No murmur heard.    No gallop.  Pulmonary:     Effort: Pulmonary effort is normal. No respiratory distress.     Breath sounds: Normal breath sounds. No wheezing or rales.  Abdominal:     Palpations: Abdomen is soft.     Tenderness: There is generalized abdominal tenderness. There is no guarding.     Hernia: No hernia is present.  Musculoskeletal:     Right lower leg: No edema.     Left lower leg: No edema.  Skin:    General: Skin is warm and dry.  Neurological:     Mental Status: She is alert and oriented to person, place, and time. Mental status is at baseline.  Psychiatric:        Mood and Affect: Mood normal.        Behavior: Behavior normal.    Data Reviewed: CBC with WBC  of 11.4, hemoglobin of 13.3, platelets of 356 CMP with sodium of 138, potassium 3.1, bicarb 18, glucose 151, BUN 9, creatinine 0.53, anion gap 16, AST 21, ALT 18 and GFR above 60 Lactic acid 1.2 Beta hydroxybutyrate acid 4.41 Urinalysis with glucosuria, ketonuria, and increased specific gravity  CT ABDOMEN PELVIS WO CONTRAST  Result Date: 07/15/2023 CLINICAL DATA:  Sharp midline abdominal pain associated with nausea vomiting for 3 days. EXAM: CT ABDOMEN AND PELVIS WITHOUT CONTRAST TECHNIQUE: Multidetector CT imaging of the abdomen and pelvis was performed following the standard protocol without IV contrast. RADIATION DOSE REDUCTION: This exam was performed  according to the departmental dose-optimization program which includes automated exposure control, adjustment of the mA and/or kV according to patient size and/or use of iterative reconstruction technique. COMPARISON:  None Available. FINDINGS: Lower chest: No acute abnormality. Hepatobiliary: No focal liver abnormality is seen. No gallstones, gallbladder wall thickening, or biliary dilatation. Pancreas: Unremarkable. No pancreatic ductal dilatation or surrounding inflammatory changes. Spleen: Normal in size without focal abnormality. Adrenals/Urinary Tract: Bilateral adrenal gland thickening consistent with hyperplasia. Kidneys normal in size, orientation and position. 4 mm nonobstructing stone, mid to lower pole of the right kidney. No renal masses. No hydronephrosis. Normal ureters. Normal bladder. Stomach/Bowel: Normal stomach. Small bowel and colon are normal in caliber. No wall thickening. No inflammation. Left colon diverticula mostly along the sigmoid. No diverticulitis. Normal appendix visualized. Vascular/Lymphatic: Mild aortic atherosclerotic calcifications. No aneurysm. No enlarged lymph nodes. Reproductive: Status post hysterectomy. No adnexal masses. Other: No abdominal wall hernia or abnormality. No abdominopelvic ascites. Musculoskeletal: No fracture or acute finding. Previous posterior lumbar spine fusion, L4 through S1, hardware are well positioned and seated. Subtle changes of avascular necrosis, left superior femoral head without subchondral collapse. No bone lesions. IMPRESSION: 1. No acute findings within the abdomen or pelvis. No findings to account for the patient's symptoms. 2. Small nonobstructing right kidney stone. Left colon diverticulosis without diverticulitis. 3. Aortic atherosclerosis. Aortic Atherosclerosis (ICD10-I70.0). Electronically Signed   By: Amie Portland M.D.   On: 07/15/2023 13:59    There are no new results to review at this time.  Assessment and Plan:  * Acute  gastroenteritis Patient is presenting with +48 hours of nausea, vomiting, diarrhea and abdominal pain overall concerning for viral gastroenteritis.  CT imaging is reassuring and no focal abdominal pain on exam.  Patient noted that her stool turned very dark yesterday but she has not had any bowel movements today.  Given persistent nausea/vomiting with inability to tolerate p.o., will continue with IV fluids.  - S/p 1 L bolus.  Additional liter bolus ordered - Maintenance fluids - Trial of Phenergan given no relief with Zofran and Compazine - GI panel - Protonix 40 mg IV - Requested patient inform medical team regarding any additional bowel movements  Increased anion gap metabolic acidosis Bicarb of 18 with anion gap of 16 noted on admission.  Beta hydroxybutyrate acid is elevated, however given only mild decrease in bicarb, this is more consistent with starvation ketoacidosis rather than euglycemic DKA.  - IV fluids as ordered - Repeat BMP and BHA in 8 hours  Type 2 diabetes mellitus with diabetic neuropathy, without long-term current use of insulin (HCC) Well-controlled type 2 diabetes with last A1c of 7.5% 3 weeks prior.  - Hold home regimen - SSI, moderate  Hypothyroidism - Resume home Synthroid  Hypokalemia - 40 mill equivalents of IV potassium ordered - Recheck BMP in 8 hours  Essential hypertension - Resume home antihypertensives  tomorrow  Chronic diastolic CHF (congestive heart failure) (HCC) Patient appears euvolemic on examination with no evidence of pulmonary or peripheral edema.  Hold Lasix in the setting of intractable nausea and vomiting given high risk for AKI.  - Daily weights - Hold home regimen  Atrial fibrillation (HCC) - Hold home Eliquis for today given melena reported yesterday - Can likely restart tomorrow if no additional episodes  Chronic airway obstruction (HCC) No shortness of breath or wheezing reported.  - Continue home Trelegy - DuoNebs as  needed  Memory deficit - Continue home donepezil  Advance Care Planning:   Code Status: Full Code   Consults: None  Family Communication: No family at bedside  Severity of Illness: The appropriate patient status for this patient is OBSERVATION. Observation status is judged to be reasonable and necessary in order to provide the required intensity of service to ensure the patient's safety. The patient's presenting symptoms, physical exam findings, and initial radiographic and laboratory data in the context of their medical condition is felt to place them at decreased risk for further clinical deterioration. Furthermore, it is anticipated that the patient will be medically stable for discharge from the hospital within 2 midnights of admission.   Author: Verdene Lennert, MD 07/15/2023 6:19 PM  For on call review [email or web address].

## 2023-07-15 NOTE — ED Notes (Signed)
Call Light answered, Pt states "IV is burning, RN notified.

## 2023-07-15 NOTE — Assessment & Plan Note (Signed)
Well-controlled type 2 diabetes with last A1c of 7.5% 3 weeks prior.  - Hold home regimen - SSI, moderate

## 2023-07-15 NOTE — Assessment & Plan Note (Signed)
-  Continue home donepezil 

## 2023-07-15 NOTE — ED Provider Notes (Signed)
[facility] Provider Note    Event Date/Time   First MD Initiated Contact with Patient 07/15/23 1234     (approximate)   History   No chief complaint on file.   HPI Teresa [name] is a 77 y.o. female with history of COPD, CHF, DM2, HTN, HLD presenting today for abdominal pain.  Patient states over the past couple of days she has had recurrent nausea with vomiting and dry heaving.  She is also started noticing abdominal pain which is new.  She had been having diarrhea and last bowel movement was this morning.  No blood in her bowel movements or vomit.  Denies shortness of breath or chest pain.  Recently in a rehab hospital following back surgery in November.     Physical Exam   Triage Vital Signs: ED Triage Vitals [07/15/23 1046]  Encounter Vitals Group     BP 125/66     Systolic BP Percentile      Diastolic BP Percentile      Pulse Rate (!) 59     Resp 20     Temp 97.6 F (36.4 C)     Temp Source Oral     SpO2 94 %     Weight 184 lb 15.5 oz (83.9 kg)     Height      Head Circumference      Peak Flow      Pain Score 0     Pain Loc      Pain Education      Exclude from Growth Chart     Most recent vital signs: Vitals:   07/15/23 1046 07/15/23 1355  BP: 125/66 129/60  Pulse: (!) 59 66  Resp: 20 18  Temp: 97.6 F (36.4 C)   SpO2: 94% 97%   Physical Exam: I have reviewed the vital signs and nursing notes. General: Awake, alert, no acute distress.  Nauseous and intermittently dry heaving on exam Head:  Atraumatic, normocephalic.   ENT:  EOM intact, PERRL. Oral mucosa is pink and moist with no lesions. Neck: Neck is supple with full range of motion, No meningeal signs. Cardiovascular:  RRR, No murmurs. Peripheral pulses palpable and equal bilaterally. Respiratory:  Symmetrical chest wall expansion.  No rhonchi, rales, or wheezes.  Good air movement throughout.  No use of accessory muscles.   Musculoskeletal:  No cyanosis or edema. Moving  extremities with full ROM Abdomen:  Soft, sharp tenderness palpation throughout abdomen, nondistended. Neuro:  GCS 15, moving all four extremities, interacting appropriately. Speech clear. Psych:  Calm, appropriate.   Skin:  Warm, dry, no rash.    ED Results / Procedures / Treatments   Labs (all labs ordered are listed, but only abnormal results are displayed) Labs Reviewed  COMPREHENSIVE METABOLIC PANEL - Abnormal; Notable for the following components:      Result Value   Potassium 3.1 (*)    CO2 18 (*)    Glucose, Bld 151 (*)    Calcium 8.8 (*)    Total Bilirubin 1.8 (*)    Anion gap 16 (*)    All other components within normal limits  CBC - Abnormal; Notable for the following components:   WBC 11.4 (*)    RDW 15.7 (*)    All other components within normal limits  LIPASE, BLOOD  URINALYSIS, ROUTINE W REFLEX MICROSCOPIC  LACTIC ACID, PLASMA     EKG    RADIOLOGY Independent interpreted CT abdomen/pelvis with no acute pathology  PROCEDURES:  Critical Care performed: No  Procedures   MEDICATIONS ORDERED IN ED: Medications  sodium chloride 0.9 % bolus 1,000 mL (has no administration in time range)  prochlorperazine (COMPAZINE) injection 5 mg (has no administration in time range)  ondansetron (ZOFRAN) injection 4 mg (4 mg Intravenous Given 07/15/23 1253)  fentaNYL (SUBLIMAZE) injection 50 mcg (50 mcg Intravenous Given 07/15/23 1253)     IMPRESSION / MDM / ASSESSMENT AND PLAN / ED COURSE  I reviewed the triage vital signs and the nursing notes.                              Differential diagnosis includes, but is not limited to, SBO, diverticulitis, appendicitis, pancreatitis, colitis  Patient's presentation is most consistent with acute complicated illness / injury requiring diagnostic workup.  Patient is a 77 year old female presenting today with nausea, vomiting, and sharp generalized abdominal pain.  Will get laboratory workup as well as CT abdomen/pelvis  for further evaluation.  Vital signs otherwise stable on arrival.  Laboratory workup notable for acidosis with anion gap elevation.  Glucose only 151 with low suspicion for DKA.  Lipase unremarkable.  T. bili elevated but no overt tenderness in the right upper quadrant.  CT abdomen/pelvis shows no acute intra-abdominal pathology.  Patient was reassessed after initial Zofran and morphine with continued nausea symptoms.  Will give additional nausea medication as well as 1 L fluids with suspected starvation ketoacidosis in the setting of nausea, vomiting, and diarrhea.  Initially discussed the case with hospitalist for admission but they recommended 1 more round of nausea medication and reassessment before potential admission.  Patient was signed out to oncoming provider pending reassessment following additional nausea medication and fluids.  Plan for admission if she still has continued symptoms and unable to tolerate p.o.  The patient is on the cardiac monitor to evaluate for evidence of arrhythmia and/or significant heart rate changes. Clinical Course as of 07/15/23 1450  Sun Jul 15, 2023  1428 Ongoing nausea and vomiting symptoms.  Given anion gap elevation suspect this is likely secondary to a viral GI bug with negative CT.  Will give fluids and additional nausea medication discussed case with hospitalist for admission [DW]    Clinical Course User Index [DW] Janith Lima, MD     FINAL CLINICAL IMPRESSION(S) / ED DIAGNOSES   Final diagnoses:  Nausea vomiting and diarrhea  High anion gap metabolic acidosis     Rx / DC Orders   ED Discharge Orders     None        Note:  This document was prepared using Dragon voice recognition software and may include unintentional dictation errors.   Janith Lima, MD 07/15/23 [phone]

## 2023-07-15 NOTE — Assessment & Plan Note (Signed)
Patient is presenting with +48 hours of nausea, vomiting, diarrhea and abdominal pain overall concerning for viral gastroenteritis.  CT imaging is reassuring and no focal abdominal pain on exam.  Patient noted that her stool turned very dark yesterday but she has not had any bowel movements today.  Given persistent nausea/vomiting with inability to tolerate p.o., will continue with IV fluids.  - S/p 1 L bolus.  Additional liter bolus ordered - Maintenance fluids - Trial of Phenergan given no relief with Zofran and Compazine - GI panel - Protonix 40 mg IV - Requested patient inform medical team regarding any additional bowel movements

## 2023-07-15 NOTE — ED Notes (Signed)
Patient noted to be vomiting after po challenge.

## 2023-07-15 NOTE — Assessment & Plan Note (Signed)
-   Hold home Eliquis for today given melena reported yesterday - Can likely restart tomorrow if no additional episodes

## 2023-07-15 NOTE — ED Notes (Signed)
Call light answered, Pt requesting emesis bag due to "Dry heaving". Rn notified.

## 2023-07-16 DIAGNOSIS — I272 Pulmonary hypertension, unspecified: Secondary | ICD-10-CM | POA: Diagnosis not present

## 2023-07-16 DIAGNOSIS — I5032 Chronic diastolic (congestive) heart failure: Secondary | ICD-10-CM | POA: Diagnosis not present

## 2023-07-16 DIAGNOSIS — Z79899 Other long term (current) drug therapy: Secondary | ICD-10-CM | POA: Diagnosis not present

## 2023-07-16 DIAGNOSIS — Z79890 Hormone replacement therapy (postmenopausal): Secondary | ICD-10-CM | POA: Diagnosis not present

## 2023-07-16 DIAGNOSIS — Z7984 Long term (current) use of oral hypoglycemic drugs: Secondary | ICD-10-CM | POA: Diagnosis not present

## 2023-07-16 DIAGNOSIS — M349 Systemic sclerosis, unspecified: Secondary | ICD-10-CM | POA: Diagnosis not present

## 2023-07-16 DIAGNOSIS — I110 Hypertensive heart disease with heart failure: Secondary | ICD-10-CM | POA: Diagnosis not present

## 2023-07-16 DIAGNOSIS — Z8249 Family history of ischemic heart disease and other diseases of the circulatory system: Secondary | ICD-10-CM | POA: Diagnosis not present

## 2023-07-16 DIAGNOSIS — E8729 Other acidosis: Secondary | ICD-10-CM | POA: Diagnosis not present

## 2023-07-16 DIAGNOSIS — R197 Diarrhea, unspecified: Secondary | ICD-10-CM | POA: Diagnosis not present

## 2023-07-16 DIAGNOSIS — K5730 Diverticulosis of large intestine without perforation or abscess without bleeding: Secondary | ICD-10-CM | POA: Diagnosis not present

## 2023-07-16 DIAGNOSIS — Z8616 Personal history of COVID-19: Secondary | ICD-10-CM | POA: Diagnosis not present

## 2023-07-16 DIAGNOSIS — E872 Acidosis, unspecified: Secondary | ICD-10-CM | POA: Diagnosis not present

## 2023-07-16 DIAGNOSIS — E039 Hypothyroidism, unspecified: Secondary | ICD-10-CM | POA: Diagnosis not present

## 2023-07-16 DIAGNOSIS — I2510 Atherosclerotic heart disease of native coronary artery without angina pectoris: Secondary | ICD-10-CM | POA: Diagnosis not present

## 2023-07-16 DIAGNOSIS — E876 Hypokalemia: Secondary | ICD-10-CM | POA: Diagnosis not present

## 2023-07-16 DIAGNOSIS — I4510 Unspecified right bundle-branch block: Secondary | ICD-10-CM | POA: Diagnosis not present

## 2023-07-16 DIAGNOSIS — I10 Essential (primary) hypertension: Secondary | ICD-10-CM | POA: Diagnosis not present

## 2023-07-16 DIAGNOSIS — I480 Paroxysmal atrial fibrillation: Secondary | ICD-10-CM | POA: Diagnosis not present

## 2023-07-16 DIAGNOSIS — M109 Gout, unspecified: Secondary | ICD-10-CM | POA: Diagnosis not present

## 2023-07-16 DIAGNOSIS — E669 Obesity, unspecified: Secondary | ICD-10-CM | POA: Diagnosis not present

## 2023-07-16 DIAGNOSIS — J4489 Other specified chronic obstructive pulmonary disease: Secondary | ICD-10-CM | POA: Diagnosis not present

## 2023-07-16 DIAGNOSIS — N200 Calculus of kidney: Secondary | ICD-10-CM | POA: Diagnosis not present

## 2023-07-16 DIAGNOSIS — K529 Noninfective gastroenteritis and colitis, unspecified: Secondary | ICD-10-CM | POA: Diagnosis not present

## 2023-07-16 DIAGNOSIS — Z7901 Long term (current) use of anticoagulants: Secondary | ICD-10-CM | POA: Diagnosis not present

## 2023-07-16 DIAGNOSIS — E785 Hyperlipidemia, unspecified: Secondary | ICD-10-CM | POA: Diagnosis not present

## 2023-07-16 DIAGNOSIS — E1140 Type 2 diabetes mellitus with diabetic neuropathy, unspecified: Secondary | ICD-10-CM | POA: Diagnosis not present

## 2023-07-16 DIAGNOSIS — Z6833 Body mass index (BMI) 33.0-33.9, adult: Secondary | ICD-10-CM | POA: Diagnosis not present

## 2023-07-16 DIAGNOSIS — R079 Chest pain, unspecified: Secondary | ICD-10-CM | POA: Diagnosis not present

## 2023-07-16 LAB — CBG MONITORING, ED
Glucose-Capillary: 100 mg/dL — ABNORMAL HIGH (ref 70–99)
Glucose-Capillary: 113 mg/dL — ABNORMAL HIGH (ref 70–99)

## 2023-07-16 LAB — CBC
HCT: 32.9 % — ABNORMAL LOW (ref 36.0–46.0)
Hemoglobin: 10.6 g/dL — ABNORMAL LOW (ref 12.0–15.0)
MCH: 30.2 pg (ref 26.0–34.0)
MCHC: 32.2 g/dL (ref 30.0–36.0)
MCV: 93.7 fL (ref 80.0–100.0)
Platelets: 268 10*3/uL (ref 150–400)
RBC: 3.51 MIL/uL — ABNORMAL LOW (ref 3.87–5.11)
RDW: 15.9 % — ABNORMAL HIGH (ref 11.5–15.5)
WBC: 8.8 10*3/uL (ref 4.0–10.5)
nRBC: 0 % (ref 0.0–0.2)

## 2023-07-16 LAB — BASIC METABOLIC PANEL
Anion gap: 10 (ref 5–15)
BUN: 11 mg/dL (ref 8–23)
CO2: 18 mmol/L — ABNORMAL LOW (ref 22–32)
Calcium: 8.1 mg/dL — ABNORMAL LOW (ref 8.9–10.3)
Chloride: 113 mmol/L — ABNORMAL HIGH (ref 98–111)
Creatinine, Ser: 0.57 mg/dL (ref 0.44–1.00)
GFR, Estimated: >60 mL/min (ref >60)
Glucose, Bld: 105 mg/dL — ABNORMAL HIGH (ref 70–99)
Potassium: 3.2 mmol/L — ABNORMAL LOW (ref 3.5–5.1)
Sodium: 141 mmol/L (ref 135–145)

## 2023-07-16 LAB — MAGNESIUM: Magnesium: 1.6 mg/dL — ABNORMAL LOW (ref 1.7–2.4)

## 2023-07-16 LAB — GLUCOSE, CAPILLARY
Glucose-Capillary: 186 mg/dL — ABNORMAL HIGH (ref 70–99)
Glucose-Capillary: 120 mg/dL — ABNORMAL HIGH (ref 70–99)

## 2023-07-16 LAB — PHOSPHORUS: Phosphorus: 2.7 mg/dL (ref 2.5–4.6)

## 2023-07-16 MED ORDER — MAGNESIUM SULFATE 2 GM/50ML IV SOLN
2.0000 g | Freq: Once | INTRAVENOUS | Status: AC
  Administered 2023-07-16: 2 g via INTRAVENOUS
  Filled 2023-07-16: qty 50

## 2023-07-16 MED ORDER — POTASSIUM CHLORIDE 10 MEQ/100ML IV SOLN
10.0000 meq | INTRAVENOUS | Status: AC
  Administered 2023-07-16 (×4): 10 meq via INTRAVENOUS
  Filled 2023-07-16: qty 100

## 2023-07-16 MED ORDER — POTASSIUM CHLORIDE CRYS ER 20 MEQ PO TBCR
40.0000 meq | EXTENDED_RELEASE_TABLET | Freq: Once | ORAL | Status: DC
Start: 2023-07-16 — End: 2023-07-16

## 2023-07-16 NOTE — ED Notes (Signed)
Assisted pt to bedside commode.

## 2023-07-16 NOTE — Progress Notes (Signed)
Patient assisted to [facility], patient had bowel movement but was not expecting to, she was initially getting up to void. Since patient had urinated in sample, sample could not be sent to lab. Nurse placed collection device in [facility] to attempt to only get stool on next occurrence.

## 2023-07-16 NOTE — Progress Notes (Addendum)
Progress Note    Teresa [name]  PXT:[id number] DOB: [birth year]  DOA: 07/15/2023 PCP: Bosie Clos, MD      Brief Narrative:    Medical records reviewed and are as summarized below:  Teresa [name] is a 77 y.o. female with medical history significant of Type 2 diabetes, hypertension, hyperlipidemia, COPD, nonobstructive CAD, hypothyroidism, CVA, atrial fibrillation on Eliquis, OSA not on CPAP, who presented to the hospital because of nausea and vomiting.  She previously had diarrhea.  She said diarrhea had resolved by the time she got to the ED.  She was admitted to the hospital for acute gastroenteritis.      Assessment/Plan:   Principal Problem:   Acute gastroenteritis Active Problems:   Increased anion gap metabolic acidosis   Type 2 diabetes mellitus with diabetic neuropathy, without long-term current use of insulin (HCC)   Hypothyroidism   Hypokalemia   Essential hypertension   Chronic diastolic CHF (congestive heart failure) (HCC)   Atrial fibrillation (HCC)   Chronic airway obstruction (HCC)   Memory deficit   Body mass index is 33.83 kg/m.  (Obesity)    Acute gastroenteritis: She still has some nausea, dry heaves and vomiting.  Continue antiemetics as needed. IV fentanyl as needed for severe abdominal pain.  Clear liquid diet for now and advance diet as tolerated.   Hypokalemia: Replete intravenously with IV potassium chloride.  She says she cannot take oral potassium chloride at this time.   Hypomagnesemia: Repleted with IV magnesium sulfate.   Chronic diastolic CHF: Compensated.  Hold Lasix for now.   COPD: Compensated   Paroxysmal atrial fibrillation, bradycardia: Continue Eliquis   Type II DM with diabetic neuropathy: NovoLog as needed for hyperglycemia Ozempic, metformin, glipizide, pioglitazone and empagliflozin on hold Hemoglobin A1c 7.5% on 06/18/2023.   Comorbidities include hypertension, hypothyroidism (TSH was 1.715 on  05/31/2023), gout, hyperlipidemia    Diet Order             Diet clear liquid Room service appropriate? Yes; Fluid consistency: Thin  Diet effective now                            Consultants: None  Procedures: None    Medications:    allopurinol  100 mg Oral QHS   apixaban  5 mg Oral BID   atorvastatin  10 mg Oral QHS   donepezil  5 mg Oral QHS   fluticasone furoate-vilanterol  1 puff Inhalation Daily   And   umeclidinium bromide  1 puff Inhalation Daily   gabapentin  800 mg Oral BID   insulin aspart  0-15 Units Subcutaneous TID WC   levothyroxine  88 mcg Oral Q0600   nystatin   Topical BID   pantoprazole (PROTONIX) IV  40 mg Intravenous Q12H   potassium chloride  40 mEq Oral Once   sertraline  50 mg Oral QHS   sodium chloride flush  3 mL Intravenous Q12H   Continuous Infusions:  promethazine (PHENERGAN) injection (IM or IVPB) 12.5 mg (07/16/23 0849)     Anti-infectives (From admission, onward)    None              Family Communication/Anticipated D/C date and plan/Code Status   DVT prophylaxis: SCDs Start: 07/15/23 1759 apixaban (ELIQUIS) tablet 5 mg     Code Status: Full Code  Family Communication: None Disposition Plan: Plan to discharge home   Status is: Observation The  patient will require care spanning > 2 midnights and should be moved to inpatient because: Nausea and vomiting       Subjective:   Interval events noted.  She complains of nausea, dry heaves and vomiting.  She says she vomited 2 times this morning.  She also has crampy abdominal pain that she grades as 8/10 in severity.    Objective:    Vitals:   07/16/23 0600 07/16/23 0700 07/16/23 0750 07/16/23 0800  BP: (!) 105/50 (!) 109/56  110/60  Pulse: (!) 59 (!) 52  (!) 54  Resp: 19 20  17   Temp:   97.6 F (36.4 C)   TempSrc:   Oral   SpO2: 92% 93%  95%  Weight:       No data found.   Intake/Output Summary (Last 24 hours) at 07/16/2023 0953 Last  data filed at 07/16/2023 0455 Gross per 24 hour  Intake 2350.24 ml  Output --  Net 2350.24 ml   Filed Weights   07/15/23 1046  Weight: 83.9 kg    Exam:  GEN: NAD SKIN: Warm and dry EYES: No pallor or icterus ENT: MMM CV: RRR PULM: CTA B ABD: soft, obese, diffuse abdominal tenderness but no rebound tenderness or guarding, +BS CNS: AAO x 3, non focal EXT: No edema or tenderness       Data Reviewed:   I have personally reviewed following labs and imaging studies:  Labs: Labs show the following:   Basic Metabolic Panel: Recent Labs  Lab 07/15/23 1048 07/15/23 1844 07/16/23 0741 07/16/23 0746  NA 138 141  --  141  K 3.1* 3.2*  --  3.2*  CL 104 109  --  113*  CO2 18* 19*  --  18*  GLUCOSE 151* 145*  --  105*  BUN 9 11  --  11  CREATININE 0.53 0.55  --  0.57  CALCIUM 8.8* 8.3*  --  8.1*  MG  --   --  1.6*  --   PHOS  --   --  2.7  --    GFR Estimated Creatinine Clearance: 59.1 mL/min (by C-G formula based on SCr of 0.57 mg/dL). Liver Function Tests: Recent Labs  Lab 07/15/23 1048  AST 21  ALT 18  ALKPHOS 126  BILITOT 1.8*  PROT 7.5  ALBUMIN 3.9   Recent Labs  Lab 07/15/23 1048  LIPASE 34   No results for input(s): "AMMONIA" in the last 168 hours. Coagulation profile No results for input(s): "INR", "PROTIME" in the last 168 hours.  CBC: Recent Labs  Lab 07/15/23 1048 07/16/23 0746  WBC 11.4* 8.8  HGB 13.3 10.6*  HCT 40.3 32.9*  MCV 92.9 93.7  PLT 356 268   Cardiac Enzymes: No results for input(s): "CKTOTAL", "CKMB", "CKMBINDEX", "TROPONINI" in the last 168 hours. BNP (last 3 results) No results for input(s): "PROBNP" in the last 8760 hours. CBG: Recent Labs  Lab 07/15/23 1823 07/15/23 2152 07/16/23 0748  GLUCAP 132* 113* 100*   D-Dimer: No results for input(s): "DDIMER" in the last 72 hours. Hgb A1c: No results for input(s): "HGBA1C" in the last 72 hours. Lipid Profile: No results for input(s): "CHOL", "HDL", "LDLCALC",  "TRIG", "CHOLHDL", "LDLDIRECT" in the last 72 hours. Thyroid function studies: No results for input(s): "TSH", "T4TOTAL", "T3FREE", "THYROIDAB" in the last 72 hours.  Invalid input(s): "FREET3" Anemia work up: No results for input(s): "VITAMINB12", "FOLATE", "FERRITIN", "TIBC", "IRON", "RETICCTPCT" in the last 72 hours. Sepsis Labs: Recent Labs  Lab  07/15/23 1048 07/15/23 1431 07/16/23 0746  WBC 11.4*  --  8.8  LATICACIDVEN  --  1.2  --     Microbiology No results found for this or any previous visit (from the past 240 hour(s)).  Procedures and diagnostic studies:  CT ABDOMEN PELVIS WO CONTRAST  Result Date: 07/15/2023 CLINICAL DATA:  Sharp midline abdominal pain associated with nausea vomiting for 3 days. EXAM: CT ABDOMEN AND PELVIS WITHOUT CONTRAST TECHNIQUE: Multidetector CT imaging of the abdomen and pelvis was performed following the standard protocol without IV contrast. RADIATION DOSE REDUCTION: This exam was performed according to the departmental dose-optimization program which includes automated exposure control, adjustment of the mA and/or kV according to patient size and/or use of iterative reconstruction technique. COMPARISON:  None Available. FINDINGS: Lower chest: No acute abnormality. Hepatobiliary: No focal liver abnormality is seen. No gallstones, gallbladder wall thickening, or biliary dilatation. Pancreas: Unremarkable. No pancreatic ductal dilatation or surrounding inflammatory changes. Spleen: Normal in size without focal abnormality. Adrenals/Urinary Tract: Bilateral adrenal gland thickening consistent with hyperplasia. Kidneys normal in size, orientation and position. 4 mm nonobstructing stone, mid to lower pole of the right kidney. No renal masses. No hydronephrosis. Normal ureters. Normal bladder. Stomach/Bowel: Normal stomach. Small bowel and colon are normal in caliber. No wall thickening. No inflammation. Left colon diverticula mostly along the sigmoid. No  diverticulitis. Normal appendix visualized. Vascular/Lymphatic: Mild aortic atherosclerotic calcifications. No aneurysm. No enlarged lymph nodes. Reproductive: Status post hysterectomy. No adnexal masses. Other: No abdominal wall hernia or abnormality. No abdominopelvic ascites. Musculoskeletal: No fracture or acute finding. Previous posterior lumbar spine fusion, L4 through S1, hardware are well positioned and seated. Subtle changes of avascular necrosis, left superior femoral head without subchondral collapse. No bone lesions. IMPRESSION: 1. No acute findings within the abdomen or pelvis. No findings to account for the patient's symptoms. 2. Small nonobstructing right kidney stone. Left colon diverticulosis without diverticulitis. 3. Aortic atherosclerosis. Aortic Atherosclerosis (ICD10-I70.0). Electronically Signed   By: Amie Portland M.D.   On: 07/15/2023 13:59               LOS: 0 days   Meagen Limones  [facility]   Pager on [email or web address]. If 7PM-7AM, please contact night-coverage at [email or web address]     07/16/2023, 9:53 AM

## 2023-07-16 NOTE — ED Notes (Signed)
Assisted pt back to bed.

## 2023-07-16 NOTE — ED Notes (Signed)
Pt c/o persistent nausea but is currently drinking coffee and other clear liquids from her breakfast tray.

## 2023-07-16 NOTE — ED Notes (Signed)
Pt transferred to inpatient room. Upon transfer, pt axox4 and able to move all extremities independently. Pt in no distress.

## 2023-07-17 ENCOUNTER — Inpatient Hospital Stay: Payer: PPO

## 2023-07-17 DIAGNOSIS — K529 Noninfective gastroenteritis and colitis, unspecified: Secondary | ICD-10-CM | POA: Diagnosis not present

## 2023-07-17 LAB — CBC WITH DIFFERENTIAL/PLATELET
Abs Immature Granulocytes: 0.02 10*3/uL (ref 0.00–0.07)
Abs Immature Granulocytes: 0.04 10*3/uL (ref 0.00–0.07)
Basophils Absolute: 0.1 10*3/uL (ref 0.0–0.1)
Basophils Absolute: 0.1 10*3/uL (ref 0.0–0.1)
Basophils Relative: 1 %
Basophils Relative: 1 %
Eosinophils Absolute: 0.5 10*3/uL (ref 0.0–0.5)
Eosinophils Absolute: 0.4 10*3/uL (ref 0.0–0.5)
Eosinophils Relative: 5 %
Eosinophils Relative: 4 %
HCT: 37.4 % (ref 36.0–46.0)
HCT: 37.5 % (ref 36.0–46.0)
Hemoglobin: 12 g/dL (ref 12.0–15.0)
Hemoglobin: 11.9 g/dL — ABNORMAL LOW (ref 12.0–15.0)
Immature Granulocytes: 0 %
Immature Granulocytes: 0 %
Lymphocytes Relative: 35 %
Lymphocytes Relative: 29 %
Lymphs Abs: 3 10*3/uL (ref 0.7–4.0)
Lymphs Abs: 2.9 10*3/uL (ref 0.7–4.0)
MCH: 30.5 pg (ref 26.0–34.0)
MCH: 30.4 pg (ref 26.0–34.0)
MCHC: 32.1 g/dL (ref 30.0–36.0)
MCHC: 31.7 g/dL (ref 30.0–36.0)
MCV: 95.2 fL (ref 80.0–100.0)
MCV: 95.7 fL (ref 80.0–100.0)
Monocytes Absolute: 0.5 10*3/uL (ref 0.1–1.0)
Monocytes Absolute: 0.4 10*3/uL (ref 0.1–1.0)
Monocytes Relative: 6 %
Monocytes Relative: 4 %
Neutro Abs: 4.6 10*3/uL (ref 1.7–7.7)
Neutro Abs: 6.2 10*3/uL (ref 1.7–7.7)
Neutrophils Relative %: 53 %
Neutrophils Relative %: 62 %
Platelets: 269 10*3/uL (ref 150–400)
Platelets: 249 10*3/uL (ref 150–400)
RBC: 3.93 MIL/uL (ref 3.87–5.11)
RBC: 3.92 MIL/uL (ref 3.87–5.11)
RDW: 15.9 % — ABNORMAL HIGH (ref 11.5–15.5)
RDW: 15.9 % — ABNORMAL HIGH (ref 11.5–15.5)
WBC: 8.6 10*3/uL (ref 4.0–10.5)
WBC: 10 10*3/uL (ref 4.0–10.5)
nRBC: 0 % (ref 0.0–0.2)
nRBC: 0 % (ref 0.0–0.2)

## 2023-07-17 LAB — BASIC METABOLIC PANEL
Anion gap: 4 — ABNORMAL LOW (ref 5–15)
Anion gap: 9 (ref 5–15)
BUN: 8 mg/dL (ref 8–23)
BUN: 9 mg/dL (ref 8–23)
CO2: 24 mmol/L (ref 22–32)
CO2: 19 mmol/L — ABNORMAL LOW (ref 22–32)
Calcium: 8.3 mg/dL — ABNORMAL LOW (ref 8.9–10.3)
Calcium: 8.2 mg/dL — ABNORMAL LOW (ref 8.9–10.3)
Chloride: 114 mmol/L — ABNORMAL HIGH (ref 98–111)
Chloride: 111 mmol/L (ref 98–111)
Creatinine, Ser: 0.51 mg/dL (ref 0.44–1.00)
Creatinine, Ser: 0.64 mg/dL (ref 0.44–1.00)
GFR, Estimated: >60 mL/min (ref >60)
GFR, Estimated: >60 mL/min (ref >60)
Glucose, Bld: 134 mg/dL — ABNORMAL HIGH (ref 70–99)
Glucose, Bld: 246 mg/dL — ABNORMAL HIGH (ref 70–99)
Potassium: 3.1 mmol/L — ABNORMAL LOW (ref 3.5–5.1)
Potassium: 3.5 mmol/L (ref 3.5–5.1)
Sodium: 142 mmol/L (ref 135–145)
Sodium: 139 mmol/L (ref 135–145)

## 2023-07-17 LAB — PHOSPHORUS: Phosphorus: 2.8 mg/dL (ref 2.5–4.6)

## 2023-07-17 LAB — GLUCOSE, CAPILLARY
Glucose-Capillary: 132 mg/dL — ABNORMAL HIGH (ref 70–99)
Glucose-Capillary: 186 mg/dL — ABNORMAL HIGH (ref 70–99)
Glucose-Capillary: 146 mg/dL — ABNORMAL HIGH (ref 70–99)
Glucose-Capillary: 103 mg/dL — ABNORMAL HIGH (ref 70–99)

## 2023-07-17 LAB — TROPONIN I (HIGH SENSITIVITY)
Troponin I (High Sensitivity): 13 ng/L (ref <18)
Troponin I (High Sensitivity): 12 ng/L (ref <18)

## 2023-07-17 LAB — MAGNESIUM: Magnesium: 1.8 mg/dL (ref 1.7–2.4)

## 2023-07-17 MED ORDER — NITROGLYCERIN 0.4 MG SL SUBL: 0.4000 mg | SUBLINGUAL_TABLET | SUBLINGUAL | Status: DC | PRN

## 2023-07-17 MED ORDER — NITROGLYCERIN 0.4 MG SL SUBL
SUBLINGUAL_TABLET | SUBLINGUAL | Status: AC
  Administered 2023-07-17: 0.4 mg via SUBLINGUAL
  Filled 2023-07-17: qty 1

## 2023-07-17 MED ORDER — ASPIRIN 81 MG PO CHEW
CHEWABLE_TABLET | ORAL | Status: AC
  Administered 2023-07-17: 324 mg via ORAL
  Filled 2023-07-17: qty 1

## 2023-07-17 MED ORDER — POTASSIUM CHLORIDE 2 MEQ/ML IV SOLN
INTRAVENOUS | Status: AC
  Filled 2023-07-17 (×2): qty 1000

## 2023-07-17 MED ORDER — ASPIRIN 81 MG PO CHEW: 324.0000 mg | CHEWABLE_TABLET | Freq: Once | ORAL | Status: AC

## 2023-07-17 MED ORDER — KCL-LACTATED RINGERS 20 MEQ/L IV SOLN
INTRAVENOUS | Status: DC
  Filled 2023-07-17 (×2): qty 1000

## 2023-07-17 MED ORDER — MAGNESIUM SULFATE 2 GM/50ML IV SOLN
2.0000 g | Freq: Once | INTRAVENOUS | Status: AC
  Administered 2023-07-17: 2 g via INTRAVENOUS
  Filled 2023-07-17: qty 50

## 2023-07-17 MED ORDER — POTASSIUM CHLORIDE CRYS ER 20 MEQ PO TBCR
40.0000 meq | EXTENDED_RELEASE_TABLET | ORAL | Status: AC
  Administered 2023-07-17 (×2): 40 meq via ORAL
  Filled 2023-07-17 (×2): qty 2

## 2023-07-17 MED ORDER — HYDROMORPHONE HCL 1 MG/ML IJ SOLN
0.5000 mg | Freq: Once | INTRAMUSCULAR | Status: AC
  Administered 2023-07-17: 0.5 mg via INTRAVENOUS
  Filled 2023-07-17: qty 0.5

## 2023-07-17 NOTE — TOC Initial Note (Signed)
Transition of Care [facility]) - Initial/Assessment Note    Patient Details  Name: Teresa [name] MRN: [id number] Date of Birth: 06-Mar-1946  [facility] of [facility]) CM/SW Contact:    Marlowe Sax, RN Phone Number: 07/17/2023, 11:13 AM  Clinical Narrative:                 Met with the patient at the bedside, she lives alone, she was at [facility] from [facility] 15 thru last Thursday and has used all of her free days, she understands that she has to have a 60 day custodial break to return to [facility] without having to pay out of pocket She plans to go home once she is well and not throwing up Her friend or family will transport  Expected Discharge Plan: Home/Self Care Barriers to Discharge: Continued Medical Work up   Patient Goals and CMS Choice            Expected Discharge Plan and Services   Discharge Planning Services: CM Consult                               HH Arranged: NA          Prior Living Arrangements/Services   Lives with:: Self Patient language and need for interpreter reviewed:: Yes        Need for Family Participation in Patient Care: No (Comment) Care giver support system in place?: No (comment) Current home services: DME (rollator, rolling walker) Criminal Activity/Legal Involvement Pertinent to Current Situation/Hospitalization: No - Comment as needed  Activities of Daily Living   ADL Screening (condition at time of admission) Independently performs ADLs?: Yes (appropriate for developmental age) Is the patient deaf or have difficulty hearing?: No Does the patient have difficulty seeing, even when wearing glasses/contacts?: No Does the patient have difficulty concentrating, remembering, or making decisions?: No  Permission Sought/Granted   Permission granted to share information with : Yes, Verbal Permission Granted              Emotional Assessment Appearance:: Appears stated age Attitude/Demeanor/Rapport: Engaged Affect (typically observed):  Accepting Orientation: : Oriented to Self, Oriented to Place, Oriented to  Time, Oriented to Situation Alcohol / Substance Use: Not Applicable Psych Involvement: No (comment)  Admission diagnosis:  Acute gastroenteritis [K52.9] Starvation ketoacidosis [T73.0XXA, E87.29] High anion gap metabolic acidosis [E87.29] Nausea vomiting and diarrhea [R11.2, R19.7] Patient Active Problem List   Diagnosis Date Noted   Acute gastroenteritis 07/15/2023   Increased anion gap metabolic acidosis 07/15/2023   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 06/18/2023   S/P lumbar fusion 06/18/2023   Dysautonomia (HCC) 09/01/2022   Memory deficit 07/12/2022   Major osseous defect    Post-traumatic arthritis of left ankle    Pain due to internal orthopedic prosthetic devices, implants and grafts, initial encounter (HCC)    Status post ankle arthrodesis 03/03/2022   Osteopenia of multiple sites 12/12/2021   Seizure-like activity (HCC) 09/27/2021   Atrial fibrillation (HCC) 08/09/2021   Hypokalemia 06/13/2021   Chronic diastolic CHF (congestive heart failure) (HCC) 05/16/2019   Benign neoplasm of cecum    Diverticulosis of large intestine without diverticulitis    Polyneuropathy associated with underlying disease (HCC) 12/11/2016   Microcalcifications of the breast 12/15/2015   Back pain, chronic 06/28/2015   Chronic airway obstruction (HCC) 06/28/2015   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   Essential hypertension 06/21/2009   CAD (coronary  artery disease) 06/21/2009   DYSPHAGIA 06/21/2009   RUQ PAIN 06/21/2009   HIATAL HERNIA 01/07/2008   Hypothyroidism 11/22/2007   Anxiety 11/22/2007   DEPRESSION 11/22/2007   Hypertensive heart disease without heart failure 11/22/2007   ANGINA PECTORIS 11/22/2007   RENAL CALCULUS 11/22/2007   Osteoarthritis 11/22/2007   Sleep apnea 11/22/2007   GASTRITIS 10/23/2007   DIVERTICULOSIS, COLON 10/23/2007   Type 2 diabetes  mellitus with diabetic neuropathy, without long-term current use of insulin (HCC) 07/16/2007   HYPERCHOLESTEROLEMIA 07/16/2007   OBESITY, MODERATE 07/16/2007   ISCHEMIC HEART DISEASE 07/16/2007   ESOPHAGEAL REFLUX 07/16/2007   SCLERODERMA 07/16/2007   PCP:  Bosie Clos, MD Pharmacy:   [facility] - [place], [place] - [address] [place] [address] Phone: [phone] Fax: [phone]  [facility] - [place], [place] - [address] [place] [address] Phone: [phone] Fax: [phone]  [id number] [place], [place] - [address] AT [facility] [address] [place] [address] [place] [address] Phone: [phone] Fax: [phone]     Social Determinants of Health (SDOH) Social History: SDOH Screenings   Food Insecurity: No Food Insecurity (07/16/2023)  Recent Concern: Food Insecurity - Food Insecurity Present (05/26/2023)   Received from [facility]  Housing: Low Risk  (07/16/2023)  Transportation Needs: No Transportation Needs (07/16/2023)  Utilities: Not At Risk (07/16/2023)  Recent Concern: Utilities - At Risk (05/26/2023)   Received from [facility]  Alcohol Screen: Low Risk  (09/19/2021)  Depression (PHQ2-9): Low Risk  (08/22/2022)  Recent Concern: Depression (PHQ2-9) - Medium Risk (07/21/2022)  Financial Resource Strain: High Risk (05/26/2023)   Received from [facility]  Physical Activity: Sufficiently Active (09/19/2021)  Social Connections: Moderately Integrated (09/19/2021)  Stress: Stress Concern Present (09/19/2021)  Tobacco Use: Low Risk  (06/28/2023)   SDOH Interventions:     Readmission Risk Interventions     No data to display

## 2023-07-17 NOTE — Plan of Care (Signed)

## 2023-07-17 NOTE — Progress Notes (Addendum)
Responded to rapid response.  Patient complained of severe chest pain and described it as an elephant on her chest.  She also felt pain was given and neck.  She complained of shortness of breath and feeling a little dizzy. Vital signs were stable systolic BP in the 130s.  She's tolerating room air. No acute respiratory distress. Exam significant for mild bibasilar rales and anterior chest wall tenderness Stat EKG showed normal sinus rhythm, right bundle branch block, no ST elevation or depression.  T wave inversions in leads V1 to V3 similar to previous EKG on 06/25/2023. There were new T wave inversions in leads V4 to V6. Patient was given 1 dose of sublingual nitroglycerin and 4 tablets of baby aspirin.  Stat chest x-ray, CBC, BMP and troponins were ordered.   ADDENDUM   Chest x-ray did not show any acute abnormality and troponins x 2 were negative.  Mild non anion gap metabolic acidosis on BMP likely from diarrhea. Suspect chest pain may be musculoskeletal.  She has degenerative disease of the cervical spine.  Analgesics as needed for pain. Continue to monitor.

## 2023-07-17 NOTE — Significant Event (Signed)
Rapid Response Event Note   Reason for Call :  Chest pain, shortness of breath  Initial Focused Assessment:  Rapid response RN arrived in patient's room as MD was arriving at bedside. Patient sitting up in bed surrounded by 1A staff. Patient alert, complaining of 9 out of 10 pain in her mid chest with shortness of breath. Respirations not labored when patient at rest, but become so as patient tries to talk for more than a few words. Last BP with MAP 70s, taken just as rapid response RN arrived.  Interventions:  MD ordered 12 lead, one dose of sublingual nitroglycerin, labs, chest x-ray, and aspirin. 5 minutes after nitroglycerin administration, patient stating pain still 9 out of 10.  Plan of Care:  Patient to remain on 1A for now. MD to review results of labs and xray and make further adjustments to patient's orders as needed. Patient's RN getting clarification on plan for patient's current chest pain, will reach back out to rapid response team if any further needs.  Event Summary:   MD Notified: Dr. Myriam Forehand Call Time: 13:34 Arrival Time: 13:36 End Time: 14:00  Bennie Dallas, RN

## 2023-07-17 NOTE — Progress Notes (Signed)
Patient complaining of chest pain and SOB. Patient states that she feels like there is an "elephant sitting on my chest". Charge nurse notified, MD notified. MD arrived at bedside, Rapid Response Called.

## 2023-07-17 NOTE — Progress Notes (Addendum)
Progress Note    Teresa [name]  WJX:[id number] DOB: November 13, 1945  DOA: 07/15/2023 PCP: Bosie Clos, MD      Brief Narrative:    Medical records reviewed and are as summarized below:  Teresa [name] is a 77 y.o. female with medical history significant of Type 2 diabetes, hypertension, hyperlipidemia, COPD, nonobstructive CAD, hypothyroidism, CVA, atrial fibrillation on Eliquis, OSA not on CPAP, who presented to the hospital because of nausea and vomiting.  She previously had diarrhea.  She said diarrhea had resolved by the time she got to the ED. However, diarrhea recurred during hospitalization.  She was admitted to the hospital for acute gastroenteritis.      Assessment/Plan:   Principal Problem:   Acute gastroenteritis Active Problems:   Increased anion gap metabolic acidosis   Type 2 diabetes mellitus with diabetic neuropathy, without long-term current use of insulin (HCC)   Hypothyroidism   Hypokalemia   Essential hypertension   Chronic diastolic CHF (congestive heart failure) (HCC)   Atrial fibrillation (HCC)   Chronic airway obstruction (HCC)   Memory deficit   Body mass index is 33.83 kg/m.  (Obesity)    Acute gastroenteritis: She has nausea, abdominal pain and diarrhea.  Check stool for C. difficile toxin and GI panel.  Analgesics as needed for pain.  Liberalize diet as tolerated. Gentle hydration with IV fluids because of poor oral intake and diarrhea   Hypokalemia: Potassium is still low.  Continue potassium repletion.   Hypomagnesemia: Improved.  Continue magnesium repletion given ongoing diarrhea. Continue to monitor electrolytes   Chronic diastolic CHF: Compensated.  Hold Lasix for now.   COPD: Compensated   Paroxysmal atrial fibrillation, bradycardia: Continue Eliquis   Type II DM with diabetic neuropathy: NovoLog as needed for hyperglycemia Ozempic, metformin, glipizide, pioglitazone and empagliflozin on hold Hemoglobin A1c  7.5% on 06/18/2023.   Comorbidities include hypertension, hypothyroidism (TSH was 1.715 on 05/31/2023), gout, hyperlipidemia    Diet Order             Diet Carb Modified Fluid consistency: Thin  Diet effective now                            Consultants: None  Procedures: None    Medications:    allopurinol  100 mg Oral QHS   apixaban  5 mg Oral BID   atorvastatin  10 mg Oral QHS   donepezil  5 mg Oral QHS   fluticasone furoate-vilanterol  1 puff Inhalation Daily   And   umeclidinium bromide  1 puff Inhalation Daily   gabapentin  800 mg Oral BID   insulin aspart  0-15 Units Subcutaneous TID WC   levothyroxine  88 mcg Oral Q0600   nystatin   Topical BID   pantoprazole (PROTONIX) IV  40 mg Intravenous Q12H   potassium chloride  40 mEq Oral Q4H   sertraline  50 mg Oral QHS   sodium chloride flush  3 mL Intravenous Q12H   Continuous Infusions:  lactated ringers with KCl 20 mEq/L     promethazine (PHENERGAN) injection (IM or IVPB) 12.5 mg (07/16/23 2301)     Anti-infectives (From admission, onward)    None              Family Communication/Anticipated D/C date and plan/Code Status   DVT prophylaxis: SCDs Start: 07/15/23 1759 apixaban (ELIQUIS) tablet 5 mg     Code Status: Full Code  Family Communication: None Disposition Plan: Plan to discharge home   Status is: Inpatient Remains inpatient appropriate because: Acute gastroenteritis         Subjective:   Interval events noted.  She complains of nausea, abdominal pain and diarrhea.  She had 3 watery stools yesterday and 2 watery stools this morning.  No vomiting.    Objective:    Vitals:   07/16/23 1400 07/16/23 1600 07/16/23 2249 07/17/23 0812  BP: (!) 107/56 (!) 126/54 (!) 142/64 (!) 149/59  Pulse: (!) 52 (!) 55 (!) 57 (!) 56  Resp: 19  18 16   Temp:  97.8 F (36.6 C) 98.5 F (36.9 C) 98 F (36.7 C)  TempSrc:  Oral    SpO2: 94% 92% 92% 94%  Weight:       No  data found.   Intake/Output Summary (Last 24 hours) at 07/17/2023 1308 Last data filed at 07/16/2023 1520 Gross per 24 hour  Intake 150 ml  Output --  Net 150 ml   Filed Weights   07/15/23 1046  Weight: 83.9 kg    Exam:  GEN: NAD SKIN: Warm and dry.  Small open wound near surgical incisional scar on the right lower back.  There is no drainage from the wound. EYES: EOMI ENT: MMM CV: RRR PULM: CTA B ABD: soft, obese, mid abdominal tenderness but no rebound tenderness or guarding, +BS CNS: AAO x 3, non focal EXT: No edema or tenderness       Data Reviewed:   I have personally reviewed following labs and imaging studies:  Labs: Labs show the following:   Basic Metabolic Panel: Recent Labs  Lab 07/15/23 1048 07/15/23 1844 07/16/23 0741 07/16/23 0746 07/17/23 0533  NA 138 141  --  141 142  K 3.1* 3.2*  --  3.2* 3.1*  CL 104 109  --  113* 114*  CO2 18* 19*  --  18* 24  GLUCOSE 151* 145*  --  105* 134*  BUN 9 11  --  11 8  CREATININE 0.53 0.55  --  0.57 0.51  CALCIUM 8.8* 8.3*  --  8.1* 8.3*  MG  --   --  1.6*  --  1.8  PHOS  --   --  2.7  --  2.8   GFR Estimated Creatinine Clearance: 59.1 mL/min (by C-G formula based on SCr of 0.51 mg/dL). Liver Function Tests: Recent Labs  Lab 07/15/23 1048  AST 21  ALT 18  ALKPHOS 126  BILITOT 1.8*  PROT 7.5  ALBUMIN 3.9   Recent Labs  Lab 07/15/23 1048  LIPASE 34   No results for input(s): "AMMONIA" in the last 168 hours. Coagulation profile No results for input(s): "INR", "PROTIME" in the last 168 hours.  CBC: Recent Labs  Lab 07/15/23 1048 07/16/23 0746 07/17/23 0533  WBC 11.4* 8.8 8.6  NEUTROABS  --   --  4.6  HGB 13.3 10.6* 12.0  HCT 40.3 32.9* 37.4  MCV 92.9 93.7 95.2  PLT 356 268 269   Cardiac Enzymes: No results for input(s): "CKTOTAL", "CKMB", "CKMBINDEX", "TROPONINI" in the last 168 hours. BNP (last 3 results) No results for input(s): "PROBNP" in the last 8760 hours. CBG: Recent Labs   Lab 07/16/23 1204 07/16/23 1736 07/16/23 2102 07/17/23 0809 07/17/23 1136  GLUCAP 113* 186* 120* 132* 186*   D-Dimer: No results for input(s): "DDIMER" in the last 72 hours. Hgb A1c: No results for input(s): "HGBA1C" in the last 72 hours. Lipid Profile: No  results for input(s): "CHOL", "HDL", "LDLCALC", "TRIG", "CHOLHDL", "LDLDIRECT" in the last 72 hours. Thyroid function studies: No results for input(s): "TSH", "T4TOTAL", "T3FREE", "THYROIDAB" in the last 72 hours.  Invalid input(s): "FREET3" Anemia work up: No results for input(s): "VITAMINB12", "FOLATE", "FERRITIN", "TIBC", "IRON", "RETICCTPCT" in the last 72 hours. Sepsis Labs: Recent Labs  Lab 07/15/23 1048 07/15/23 1431 07/16/23 0746 07/17/23 0533  WBC 11.4*  --  8.8 8.6  LATICACIDVEN  --  1.2  --   --     Microbiology No results found for this or any previous visit (from the past 240 hour(s)).  Procedures and diagnostic studies:  CT ABDOMEN PELVIS WO CONTRAST  Result Date: 07/15/2023 CLINICAL DATA:  Sharp midline abdominal pain associated with nausea vomiting for 3 days. EXAM: CT ABDOMEN AND PELVIS WITHOUT CONTRAST TECHNIQUE: Multidetector CT imaging of the abdomen and pelvis was performed following the standard protocol without IV contrast. RADIATION DOSE REDUCTION: This exam was performed according to the departmental dose-optimization program which includes automated exposure control, adjustment of the mA and/or kV according to patient size and/or use of iterative reconstruction technique. COMPARISON:  None Available. FINDINGS: Lower chest: No acute abnormality. Hepatobiliary: No focal liver abnormality is seen. No gallstones, gallbladder wall thickening, or biliary dilatation. Pancreas: Unremarkable. No pancreatic ductal dilatation or surrounding inflammatory changes. Spleen: Normal in size without focal abnormality. Adrenals/Urinary Tract: Bilateral adrenal gland thickening consistent with hyperplasia. Kidneys  normal in size, orientation and position. 4 mm nonobstructing stone, mid to lower pole of the right kidney. No renal masses. No hydronephrosis. Normal ureters. Normal bladder. Stomach/Bowel: Normal stomach. Small bowel and colon are normal in caliber. No wall thickening. No inflammation. Left colon diverticula mostly along the sigmoid. No diverticulitis. Normal appendix visualized. Vascular/Lymphatic: Mild aortic atherosclerotic calcifications. No aneurysm. No enlarged lymph nodes. Reproductive: Status post hysterectomy. No adnexal masses. Other: No abdominal wall hernia or abnormality. No abdominopelvic ascites. Musculoskeletal: No fracture or acute finding. Previous posterior lumbar spine fusion, L4 through S1, hardware are well positioned and seated. Subtle changes of avascular necrosis, left superior femoral head without subchondral collapse. No bone lesions. IMPRESSION: 1. No acute findings within the abdomen or pelvis. No findings to account for the patient's symptoms. 2. Small nonobstructing right kidney stone. Left colon diverticulosis without diverticulitis. 3. Aortic atherosclerosis. Aortic Atherosclerosis (ICD10-I70.0). Electronically Signed   By: Amie Portland M.D.   On: 07/15/2023 13:59               LOS: 1 day   Zemira Zehring  [facility]   Pager on [email or web address]. If 7PM-7AM, please contact night-coverage at [email or web address]     07/17/2023, 1:08 PM

## 2023-07-18 DIAGNOSIS — K529 Noninfective gastroenteritis and colitis, unspecified: Secondary | ICD-10-CM | POA: Diagnosis not present

## 2023-07-18 DIAGNOSIS — E876 Hypokalemia: Secondary | ICD-10-CM | POA: Diagnosis not present

## 2023-07-18 DIAGNOSIS — E1140 Type 2 diabetes mellitus with diabetic neuropathy, unspecified: Secondary | ICD-10-CM | POA: Diagnosis not present

## 2023-07-18 DIAGNOSIS — E8729 Other acidosis: Secondary | ICD-10-CM | POA: Diagnosis not present

## 2023-07-18 LAB — BASIC METABOLIC PANEL
Anion gap: 6 (ref 5–15)
BUN: 10 mg/dL (ref 8–23)
CO2: 21 mmol/L — ABNORMAL LOW (ref 22–32)
Calcium: 8.1 mg/dL — ABNORMAL LOW (ref 8.9–10.3)
Chloride: 113 mmol/L — ABNORMAL HIGH (ref 98–111)
Creatinine, Ser: 0.64 mg/dL (ref 0.44–1.00)
GFR, Estimated: >60 mL/min (ref >60)
Glucose, Bld: 159 mg/dL — ABNORMAL HIGH (ref 70–99)
Potassium: 3.8 mmol/L (ref 3.5–5.1)
Sodium: 140 mmol/L (ref 135–145)

## 2023-07-18 LAB — GLUCOSE, CAPILLARY
Glucose-Capillary: 126 mg/dL — ABNORMAL HIGH (ref 70–99)
Glucose-Capillary: 128 mg/dL — ABNORMAL HIGH (ref 70–99)
Glucose-Capillary: 156 mg/dL — ABNORMAL HIGH (ref 70–99)
Glucose-Capillary: 183 mg/dL — ABNORMAL HIGH (ref 70–99)

## 2023-07-18 LAB — MAGNESIUM: Magnesium: 1.5 mg/dL — ABNORMAL LOW (ref 1.7–2.4)

## 2023-07-18 LAB — C DIFFICILE QUICK SCREEN W PCR REFLEX
C Diff antigen: NEGATIVE
C Diff interpretation: NOT DETECTED
C Diff toxin: NEGATIVE

## 2023-07-18 MED ORDER — MAGNESIUM OXIDE -MG SUPPLEMENT 400 (240 MG) MG PO TABS
400.0000 mg | ORAL_TABLET | Freq: Every day | ORAL | Status: DC
  Administered 2023-07-18 – 2023-07-19 (×2): 400 mg via ORAL
  Filled 2023-07-18 (×2): qty 1

## 2023-07-18 NOTE — Plan of Care (Signed)

## 2023-07-18 NOTE — Progress Notes (Signed)
Progress Note   Patient: Teresa [name] ZOX:[id number] DOB: 1946/06/21 DOA: 07/15/2023     2 DOS: the patient was seen and examined on 07/18/2023   Brief hospital course: Teresa [name] is a 77 y.o. female with medical history significant of Type 2 diabetes, hypertension, hyperlipidemia, COPD, nonobstructive CAD, hypothyroidism, CVA, atrial fibrillation on Eliquis, OSA not on CPAP, who presented to the hospital because of nausea and vomiting.  She previously had diarrhea.  She said diarrhea had resolved by the time she got to the ED. However, diarrhea recurred during hospitalization.   She was admitted to the hospital for acute gastroenteritis.  Assessment and Plan: Acute gastroenteritis: She has nausea, abdominal pain and diarrhea.  Check stool for C. difficile toxin and GI panel.  Analgesics as needed for pain.  Liberalize diet as tolerated. Diarrhea better per RN. Not eating well due to nausea.    Hypokalemia: Potassium improved.  Continue potassium repletion as needed.    Hypomagnesemia: low mag noted.  Continue magnesium repletion daily.    Chronic diastolic CHF: Compensated. Hold Lasix for now due to diarrhea.    COPD: Compensated. No exacerbation.     Paroxysmal atrial fibrillation, bradycardia: Continue Eliquis    Type II DM with diabetic neuropathy: Accucheks, sliding NovoLog as needed for hyperglycemia Hold Ozempic, metformin, glipizide, pioglitazone and empagliflozin. Hemoglobin A1c 7.5% on 06/18/2023.  Hypothyroidism - continue synthroid.     Out of bed to chair. Incentive spirometry. Nursing supportive care. Fall, aspiration precautions. DVT prophylaxis   Code Status: Full Code  Subjective: Patient is seen and examined today morning. Feels sick. Has nausea, loose stools. Eating poor.  Physical Exam: Vitals:   07/17/23 1607 07/17/23 2325 07/18/23 0837 07/18/23 1538  BP: 94/62 (!) 143/77 (!) 158/69 (!) 142/68  Pulse: (!) 56 73 (!) 55 66  Resp: 16 18 16 16    Temp: 98 F (36.7 C) 98.3 F (36.8 C) 97.8 F (36.6 C) 98.1 F (36.7 C)  TempSrc:      SpO2: 95% 92% 95% 98%  Weight:        General - Elderly obese Caucasian female, no apparent distress HEENT - PERRLA, EOMI, atraumatic head, non tender sinuses. Lung - Clear, diffuse rales, no rhonchi, wheezes. Heart - S1, S2 heard, no murmurs, rubs, trace pedal edema. Abdomen - Soft, non tender, bowel sounds good Neuro - Alert, awake and oriented x 3, non focal exam. Skin - Warm and dry.  Data Reviewed:      Latest Ref Rng & Units 07/17/2023    1:49 PM 07/17/2023    5:33 AM 07/16/2023    7:46 AM  CBC  WBC 4.0 - 10.5 K/uL 10.0  8.6  8.8   Hemoglobin 12.0 - 15.0 g/dL 81.1  91.4  78.2   Hematocrit 36.0 - 46.0 % 37.5  37.4  32.9   Platelets 150 - 400 K/uL 249  269  268       Latest Ref Rng & Units 07/18/2023    5:34 AM 07/17/2023    1:49 PM 07/17/2023    5:33 AM  BMP  Glucose 70 - 99 mg/dL 956  213  086   BUN 8 - 23 mg/dL 10  9  8    Creatinine 0.44 - 1.00 mg/dL 5.78  4.69  6.29   Sodium 135 - 145 mmol/L 140  139  142   Potassium 3.5 - 5.1 mmol/L 3.8  3.5  3.1   Chloride 98 - 111 mmol/L 113  111  114   CO2 22 - 32 mmol/L 21  19  24    Calcium 8.9 - 10.3 mg/dL 8.1  8.2  8.3    DG Chest Port 1 View  Result Date: 07/17/2023 CLINICAL DATA:  Chest pain EXAM: PORTABLE CHEST 1 VIEW COMPARISON:  06/24/2019, 11/01/2021 FINDINGS: Single frontal view of the chest demonstrates a stable cardiac silhouette. Chronic areas of scarring and subpleural fibrosis are seen bilaterally, with no superimposed airspace disease, effusion, or pneumothorax. No acute bony abnormalities. IMPRESSION: 1. Chronic scarring and subpleural fibrosis. No acute airspace disease. Electronically Signed   By: Sharlet Salina M.D.   On: 07/17/2023 15:25     Family Communication: Discussed with patient, she understand and agree. All questions answereed.    Disposition: Status is: Inpatient Remains inpatient appropriate  because: loose stools, advance diet.  Planned Discharge Destination: Home with Home Health     Time spent: 37 minutes  Author: Marcelino Duster, MD 07/18/2023 5:35 PM Secure chat 7am to 7pm For on call review [email or web address].

## 2023-07-18 NOTE — Plan of Care (Signed)

## 2023-07-19 DIAGNOSIS — K529 Noninfective gastroenteritis and colitis, unspecified: Secondary | ICD-10-CM | POA: Diagnosis not present

## 2023-07-19 DIAGNOSIS — E039 Hypothyroidism, unspecified: Secondary | ICD-10-CM | POA: Diagnosis not present

## 2023-07-19 DIAGNOSIS — E8729 Other acidosis: Secondary | ICD-10-CM | POA: Diagnosis not present

## 2023-07-19 DIAGNOSIS — E1140 Type 2 diabetes mellitus with diabetic neuropathy, unspecified: Secondary | ICD-10-CM | POA: Diagnosis not present

## 2023-07-19 LAB — GLUCOSE, CAPILLARY
Glucose-Capillary: 126 mg/dL — ABNORMAL HIGH (ref 70–99)
Glucose-Capillary: 138 mg/dL — ABNORMAL HIGH (ref 70–99)

## 2023-07-19 MED ORDER — MAGNESIUM OXIDE -MG SUPPLEMENT 400 (240 MG) MG PO TABS: 400.0000 mg | ORAL_TABLET | Freq: Every day | ORAL | 0 refills | Status: DC

## 2023-07-19 MED ORDER — ONDANSETRON 4 MG PO TBDP: 4.0000 mg | ORAL_TABLET | Freq: Three times a day (TID) | ORAL | 0 refills | Status: AC | PRN

## 2023-07-19 MED ORDER — POTASSIUM CHLORIDE ER 10 MEQ PO TBCR: 20.0000 meq | EXTENDED_RELEASE_TABLET | Freq: Every day | ORAL | 0 refills | Status: DC

## 2023-07-19 NOTE — Discharge Summary (Addendum)
Physician Discharge Summary   Patient: Teresa [name] MRN: [id number] DOB: November 02, 1945  Admit date:     07/15/2023  Discharge date: 07/19/2023  Discharge Physician: Marcelino Duster   PCP: Bosie Clos, MD   Recommendations at discharge:    PCP follow up in 1 week.  Discharge Diagnoses: Principal Problem:   Acute gastroenteritis Active Problems:   Increased anion gap metabolic acidosis   Type 2 diabetes mellitus with diabetic neuropathy, without long-term current use of insulin (HCC)   Hypothyroidism   Hypokalemia   Essential hypertension   Chronic diastolic CHF (congestive heart failure) (HCC)   Atrial fibrillation (HCC)   Chronic airway obstruction (HCC)   Memory deficit  Resolved Problems:   * No resolved hospital problems. *  Hospital Course: Teresa [name] is a 77 y.o. female with medical history significant of Type 2 diabetes, hypertension, hyperlipidemia, COPD, nonobstructive CAD, hypothyroidism, CVA, atrial fibrillation on Eliquis, OSA not on CPAP, who presented to the hospital because of nausea and vomiting.  She previously had diarrhea.  She said diarrhea had resolved by the time she got to the ED. However, diarrhea recurred during hospitalization.   She was admitted to the hospital for acute gastroenteritis.  Assessment and Plan: Acute gastroenteritis: She presented with nausea, abdominal pain and diarrhea.  C. difficile studies negative. Her diarrhea improved. Advised Zofran ODT for home use.  She is able to tolerate diet and hemodynamically stable to be discharged. Advised PCP follow up.    Hypokalemia: Potassium improved. Potassium supplements script sent.    Hypomagnesemia: mag improved. Mag supplements ordered. Advised outpatient follow up for electrolyte check.    Chronic diastolic CHF: Compensated. Resumed Lasix as diarrhea improved.    COPD: Compensated. No exacerbation. Resumed home inhalers.    Paroxysmal atrial fibrillation, bradycardia:  Continue Eliquis.    Type II DM with diabetic neuropathy:  Resume Ozempic, metformin, glipizide, pioglitazone, empagliflozin. Hemoglobin A1c 7.5% on 06/18/2023. Advised to discuss with PCP regarding Semiglutide as it may cause GI symptoms.   Hypothyroidism - continue synthroid.       Consultants: none Procedures performed: none  Disposition: Home health Diet recommendation:  Discharge Diet Orders (From admission, onward)     Start     Ordered   07/19/23 0000  Diet - low sodium heart healthy        07/19/23 1203   07/19/23 0000  Diet Carb Modified        07/19/23 1203           Cardiac and Carb modified diet DISCHARGE MEDICATION: Allergies as of 07/19/2023       Reactions   Clarithromycin Nausea And Vomiting, Other (See Comments)   Codeine    Hallucination   Dilaudid  [hydromorphone Hcl] Nausea And Vomiting   Iodine Hives, Itching   Iohexol Hives, Itching   Onion Diarrhea, Nausea And Vomiting   Abdominal pain   Povidone-iodine Itching   Tamiflu  [oseltamivir Phosphate] Nausea And Vomiting   Abdominal Pain,   Zolpidem Nausea And Vomiting, Other (See Comments)   Hallucinations   Bacitracin-neomycin-polymyxin Itching, Rash   Benzalkonium Chloride Itching, Rash, Swelling   Lidocaine Hcl Itching, Rash, Swelling   Morphine Nausea And Vomiting, Rash   Neomycin-bacitracin Zn-polymyx Itching, Rash   Tape Itching, Rash   Adhesive tape - silicone   Tapentadol Rash        Medication List     STOP taking these medications    methocarbamol 500 MG tablet Commonly known as:  ROBAXIN   ondansetron 4 MG tablet Commonly known as: ZOFRAN   Oxycodone HCl 10 MG Tabs       TAKE these medications    acetaminophen 500 MG tablet Commonly known as: TYLENOL Take 1,000 mg by mouth every 6 (six) hours as needed for mild pain or headache.   albuterol 108 (90 Base) MCG/ACT inhaler Commonly known as: VENTOLIN HFA Inhale 2 puffs into the lungs every 6 (six) hours as  needed for shortness of breath.   allopurinol 100 MG tablet Commonly known as: ZYLOPRIM Take 1 tablet (100 mg total) by mouth daily. What changed: when to take this   atorvastatin 10 MG tablet Commonly known as: LIPITOR TAKE ONE TABLET BY MOUTH EVERYDAY AT BEDTIME   donepezil 5 MG tablet Commonly known as: ARICEPT Take 1 tablet (5 mg total) by mouth at bedtime.   Eliquis 5 MG Tabs tablet Generic drug: apixaban Take 5 mg by mouth 2 (two) times daily.   empagliflozin 25 MG Tabs tablet Commonly known as: Jardiance Take 1 tablet (25 mg total) by mouth daily.   FreeStyle Libre 2 Sensor Misc 1 each by Does not apply route every 14 (fourteen) days.   furosemide 40 MG tablet Commonly known as: LASIX Take 1 tablet (40 mg total) by mouth daily.   gabapentin 800 MG tablet Commonly known as: NEURONTIN Take 1 tablet (800 mg total) by mouth 3 (three) times daily.   glipiZIDE 5 MG tablet Commonly known as: GLUCOTROL Take 2.5 mg by mouth daily before breakfast.   levothyroxine 88 MCG tablet Commonly known as: SYNTHROID TAKE 1 TABLET BY MOUTH EVERY DAY   magnesium oxide 400 (240 Mg) MG tablet Commonly known as: MAG-OX Take 1 tablet (400 mg total) by mouth daily. Start taking on: July 20, 2023   metFORMIN 1000 MG tablet Commonly known as: GLUCOPHAGE Take 1 tablet (1,000 mg total) by mouth 2 (two) times daily.   nystatin cream Commonly known as: MYCOSTATIN Apply 1 Application topically 2 (two) times daily as needed (skin irritation/rash.). What changed: Another medication with the same name was changed. Make sure you understand how and when to take each.   nystatin powder Commonly known as: MYCOSTATIN/NYSTOP Apply 1 Application topically 3 (three) times daily. What changed:  when to take this reasons to take this   ondansetron 4 MG disintegrating tablet Commonly known as: ZOFRAN-ODT Take 1 tablet (4 mg total) by mouth every 8 (eight) hours as needed for nausea or  vomiting.   Ozempic (2 MG/DOSE) 8 MG/3ML Sopn Generic drug: Semaglutide (2 MG/DOSE) Inject 2 mg into the skin every Sunday.   pioglitazone 15 MG tablet Commonly known as: ACTOS Take 1 tablet (15 mg total) by mouth every morning.   potassium chloride 10 MEQ tablet Commonly known as: KLOR-CON Take 2 tablets (20 mEq total) by mouth daily for 10 days. What changed: how much to take   promethazine 12.5 MG tablet Commonly known as: PHENERGAN Take 1 tablet (12.5 mg total) by mouth every 6 (six) hours as needed for nausea or vomiting.   senna 8.6 MG Tabs tablet Commonly known as: SENOKOT Take 1 tablet (8.6 mg total) by mouth 2 (two) times daily.   sertraline 50 MG tablet Commonly known as: ZOLOFT Take 1 tablet (50 mg total) by mouth daily. What changed: when to take this   Trelegy Ellipta 200-62.5-25 MCG/ACT Aepb Generic drug: Fluticasone-Umeclidin-Vilant Inhale 1 puff into the lungs daily.        Follow-up Information  Bosie Clos, MD Follow up in 1 week(s).   Specialty: Family Medicine Contact information: [address] [place] [address] [phone]                Discharge Exam: Ceasar Mons Weights   07/15/23 1046  Weight: 83.9 kg   General - Elderly obese Caucasian female, no apparent distress HEENT - PERRLA, EOMI, atraumatic head, non tender sinuses. Lung - Clear, diffuse rales, no rhonchi, wheezes. Heart - S1, S2 heard, no murmurs, rubs, trace pedal edema. Abdomen - Soft, non tender, bowel sounds good Neuro - Alert, awake and oriented x 3, non focal exam. Skin - Warm and dry.  Condition at discharge: stable  The results of significant diagnostics from this hospitalization (including imaging, microbiology, ancillary and laboratory) are listed below for reference.   Imaging Studies: DG Chest Port 1 View Result Date: 07/17/2023 CLINICAL DATA:  Chest pain EXAM: PORTABLE CHEST 1 VIEW COMPARISON:  06/24/2019, 11/01/2021 FINDINGS: Single  frontal view of the chest demonstrates a stable cardiac silhouette. Chronic areas of scarring and subpleural fibrosis are seen bilaterally, with no superimposed airspace disease, effusion, or pneumothorax. No acute bony abnormalities. IMPRESSION: 1. Chronic scarring and subpleural fibrosis. No acute airspace disease. Electronically Signed   By: Sharlet Salina M.D.   On: 07/17/2023 15:25   CT ABDOMEN PELVIS WO CONTRAST Result Date: 07/15/2023 CLINICAL DATA:  Sharp midline abdominal pain associated with nausea vomiting for 3 days. EXAM: CT ABDOMEN AND PELVIS WITHOUT CONTRAST TECHNIQUE: Multidetector CT imaging of the abdomen and pelvis was performed following the standard protocol without IV contrast. RADIATION DOSE REDUCTION: This exam was performed according to the departmental dose-optimization program which includes automated exposure control, adjustment of the mA and/or kV according to patient size and/or use of iterative reconstruction technique. COMPARISON:  None Available. FINDINGS: Lower chest: No acute abnormality. Hepatobiliary: No focal liver abnormality is seen. No gallstones, gallbladder wall thickening, or biliary dilatation. Pancreas: Unremarkable. No pancreatic ductal dilatation or surrounding inflammatory changes. Spleen: Normal in size without focal abnormality. Adrenals/Urinary Tract: Bilateral adrenal gland thickening consistent with hyperplasia. Kidneys normal in size, orientation and position. 4 mm nonobstructing stone, mid to lower pole of the right kidney. No renal masses. No hydronephrosis. Normal ureters. Normal bladder. Stomach/Bowel: Normal stomach. Small bowel and colon are normal in caliber. No wall thickening. No inflammation. Left colon diverticula mostly along the sigmoid. No diverticulitis. Normal appendix visualized. Vascular/Lymphatic: Mild aortic atherosclerotic calcifications. No aneurysm. No enlarged lymph nodes. Reproductive: Status post hysterectomy. No adnexal masses.  Other: No abdominal wall hernia or abnormality. No abdominopelvic ascites. Musculoskeletal: No fracture or acute finding. Previous posterior lumbar spine fusion, L4 through S1, hardware are well positioned and seated. Subtle changes of avascular necrosis, left superior femoral head without subchondral collapse. No bone lesions. IMPRESSION: 1. No acute findings within the abdomen or pelvis. No findings to account for the patient's symptoms. 2. Small nonobstructing right kidney stone. Left colon diverticulosis without diverticulitis. 3. Aortic atherosclerosis. Aortic Atherosclerosis (ICD10-I70.0). Electronically Signed   By: Amie Portland M.D.   On: 07/15/2023 13:59    Microbiology: Results for orders placed or performed during the hospital encounter of 07/15/23  C Difficile Quick Screen w PCR reflex     Status: None   Collection Time: 07/18/23 10:10 AM   Specimen: STOOL  Result Value Ref Range Status   C Diff antigen NEGATIVE NEGATIVE Final   C Diff toxin NEGATIVE NEGATIVE Final   C Diff interpretation No C. difficile detected.  Final    Comment: Performed at [facility], [address] [place], [place] [address]    Labs: CBC: Recent Labs  Lab 07/15/23 1048 07/16/23 0746 07/17/23 0533 07/17/23 1349  WBC 11.4* 8.8 8.6 10.0  NEUTROABS  --   --  4.6 6.2  HGB 13.3 10.6* 12.0 11.9*  HCT 40.3 32.9* 37.4 37.5  MCV 92.9 93.7 95.2 95.7  PLT 356 268 269 249   Basic Metabolic Panel: Recent Labs  Lab 07/15/23 1844 07/16/23 0741 07/16/23 0746 07/17/23 0533 07/17/23 1349 07/18/23 0534  NA 141  --  141 142 139 140  K 3.2*  --  3.2* 3.1* 3.5 3.8  CL 109  --  113* 114* 111 113*  CO2 19*  --  18* 24 19* 21*  GLUCOSE 145*  --  105* 134* 246* 159*  BUN 11  --  11 8 9 10   CREATININE 0.55  --  0.57 0.51 0.64 0.64  CALCIUM 8.3*  --  8.1* 8.3* 8.2* 8.1*  MG  --  1.6*  --  1.8  --  1.5*  PHOS  --  2.7  --  2.8  --   --    Liver Function Tests: Recent Labs  Lab 07/15/23 1048   AST 21  ALT 18  ALKPHOS 126  BILITOT 1.8*  PROT 7.5  ALBUMIN 3.9   CBG: Recent Labs  Lab 07/18/23 1159 07/18/23 1702 07/18/23 2103 07/19/23 0801 07/19/23 1152  GLUCAP 128* 156* 183* 126* 138*    Discharge time spent: 36 minutes.  Signed: Marcelino Duster, MD [facility] 07/19/2023

## 2023-07-19 NOTE — Care Management Important Message (Signed)
Important Message  Patient Details  Name: Teresa [name] MRN: [id number] Date of Birth: Jan 31, 1946   Important Message Given:  N/A - LOS <3 / Initial given by admissions     Olegario Messier A [name] 07/19/2023, 10:02 AM

## 2023-07-19 NOTE — TOC Progression Note (Addendum)
Transition of Care [facility]) - Progression Note    Patient Details  Name: Teresa [name] MRN: [id number] Date of Birth: [birth year]  Transition of Care [facility]) CM/SW Contact  Marlowe Sax, RN Phone Number: 07/19/2023, 11:35 AM  Clinical Narrative:    Reached out to [facility] to set up [facility] for the patient  She lives alone and has used all of her rehab days  She has DME at home Her grand daughter is picking her up today  She has complained of feeling nausea today but has not been throwing up She reports that she uses Phenergan at home but feels the IV Works better  Expected Discharge Plan: Home w Home Health Services Barriers to Discharge: Continued Medical Work up  Expected Discharge Plan and Services   Discharge Planning Services: CM Consult                               HH Arranged: NA           Social Determinants of Health (SDOH) Interventions SDOH Screenings   Food Insecurity: No Food Insecurity (07/16/2023)  Recent Concern: Food Insecurity - Food Insecurity Present (05/26/2023)   Received from [facility]  Housing: Low Risk  (07/16/2023)  Transportation Needs: No Transportation Needs (07/16/2023)  Utilities: Not At Risk (07/16/2023)  Recent Concern: Utilities - At Risk (05/26/2023)   Received from [facility]  Alcohol Screen: Low Risk  (09/19/2021)  Depression (PHQ2-9): Low Risk  (08/22/2022)  Recent Concern: Depression (PHQ2-9) - Medium Risk (07/21/2022)  Financial Resource Strain: High Risk (05/26/2023)   Received from [facility]  Physical Activity: Sufficiently Active (09/19/2021)  Social Connections: Moderately Integrated (09/19/2021)  Stress: Stress Concern Present (09/19/2021)  Tobacco Use: Low Risk  (06/28/2023)    Readmission Risk Interventions     No data to display

## 2023-07-19 NOTE — Plan of Care (Signed)

## 2023-07-19 NOTE — Plan of Care (Signed)

## 2023-07-21 DIAGNOSIS — K529 Noninfective gastroenteritis and colitis, unspecified: Secondary | ICD-10-CM | POA: Diagnosis not present

## 2023-07-21 DIAGNOSIS — I509 Heart failure, unspecified: Secondary | ICD-10-CM | POA: Diagnosis not present

## 2023-07-21 DIAGNOSIS — J449 Chronic obstructive pulmonary disease, unspecified: Secondary | ICD-10-CM | POA: Diagnosis not present

## 2023-07-21 DIAGNOSIS — E119 Type 2 diabetes mellitus without complications: Secondary | ICD-10-CM | POA: Diagnosis not present

## 2023-07-23 ENCOUNTER — Telehealth: Payer: Self-pay | Admitting: Neurosurgery

## 2023-07-23 ENCOUNTER — Encounter: Payer: Self-pay | Admitting: Neurosurgery

## 2023-07-23 DIAGNOSIS — E1142 Type 2 diabetes mellitus with diabetic polyneuropathy: Secondary | ICD-10-CM | POA: Diagnosis not present

## 2023-07-23 DIAGNOSIS — E039 Hypothyroidism, unspecified: Secondary | ICD-10-CM | POA: Diagnosis not present

## 2023-07-23 DIAGNOSIS — K529 Noninfective gastroenteritis and colitis, unspecified: Secondary | ICD-10-CM | POA: Diagnosis not present

## 2023-07-23 DIAGNOSIS — E8729 Other acidosis: Secondary | ICD-10-CM | POA: Diagnosis not present

## 2023-07-23 DIAGNOSIS — J449 Chronic obstructive pulmonary disease, unspecified: Secondary | ICD-10-CM | POA: Diagnosis not present

## 2023-07-23 DIAGNOSIS — I5032 Chronic diastolic (congestive) heart failure: Secondary | ICD-10-CM | POA: Diagnosis not present

## 2023-07-23 DIAGNOSIS — I110 Hypertensive heart disease with heart failure: Secondary | ICD-10-CM | POA: Diagnosis not present

## 2023-07-23 DIAGNOSIS — Z7901 Long term (current) use of anticoagulants: Secondary | ICD-10-CM | POA: Diagnosis not present

## 2023-07-23 DIAGNOSIS — I4891 Unspecified atrial fibrillation: Secondary | ICD-10-CM | POA: Diagnosis not present

## 2023-07-23 DIAGNOSIS — E876 Hypokalemia: Secondary | ICD-10-CM | POA: Diagnosis not present

## 2023-07-23 NOTE — Telephone Encounter (Signed)
Patient states it has been draining a yellow pus. She has not had a fever or chills, she does complain of increased pain. Patient states she can come by and pick up Medihoney if she will need it.

## 2023-07-23 NOTE — Telephone Encounter (Signed)
Reviewed with Myer Haff again. Will just do medihoney and hold on antibiotics for now. Please have her pick up medihoney.   Keep f/u on Thursday.

## 2023-07-23 NOTE — Telephone Encounter (Signed)
L4-5 XLIF/PSF on 06/18/23  Teresa [name] from [facility] Patient still as the incision covered up. Towards the lower part of the incision seems like an open sore. Nickel size redness around it, and a little white line is visible. All her vitals are normal, no fever. She is still having chest pain and diarrhea.  Teresa [name] will email Royston Cowper the picture.

## 2023-07-23 NOTE — Telephone Encounter (Signed)
Agree with following up with PCP regarding CP and diarrhea.   Has incision been draining? Any fevers or chills? Any increased pain?   Please let me know.   She has f/u on Thursday. If I want her to do Medihoney, would someone be able to pick it up before then?

## 2023-07-24 DIAGNOSIS — R110 Nausea: Secondary | ICD-10-CM | POA: Diagnosis not present

## 2023-07-24 DIAGNOSIS — R197 Diarrhea, unspecified: Secondary | ICD-10-CM | POA: Diagnosis not present

## 2023-07-24 NOTE — Telephone Encounter (Signed)
Left a message for the patient to call back.  

## 2023-07-25 ENCOUNTER — Other Ambulatory Visit: Payer: Self-pay

## 2023-07-25 ENCOUNTER — Ambulatory Visit: Payer: PPO | Admitting: Podiatry

## 2023-07-25 DIAGNOSIS — M51369 Other intervertebral disc degeneration, lumbar region without mention of lumbar back pain or lower extremity pain: Secondary | ICD-10-CM

## 2023-07-26 ENCOUNTER — Ambulatory Visit
Admission: RE | Admit: 2023-07-26 | Discharge: 2023-07-26 | Disposition: A | Discharge status: Home / Self Care | Payer: PPO | Source: Ambulatory Visit | Attending: Neurosurgery | Admitting: Neurosurgery

## 2023-07-26 ENCOUNTER — Ambulatory Visit: Payer: PPO | Admitting: Neurosurgery

## 2023-07-26 ENCOUNTER — Ambulatory Visit
Admission: RE | Admit: 2023-07-26 | Discharge: 2023-07-26 | Disposition: A | Discharge status: Home / Self Care | Payer: PPO | Attending: Neurosurgery | Admitting: Neurosurgery

## 2023-07-26 VITALS — BP 134/80 | Temp 97.8°F | Wt 184.0 lb

## 2023-07-26 DIAGNOSIS — Z981 Arthrodesis status: Secondary | ICD-10-CM

## 2023-07-26 DIAGNOSIS — M48061 Spinal stenosis, lumbar region without neurogenic claudication: Secondary | ICD-10-CM

## 2023-07-26 DIAGNOSIS — M51369 Other intervertebral disc degeneration, lumbar region without mention of lumbar back pain or lower extremity pain: Secondary | ICD-10-CM | POA: Diagnosis not present

## 2023-07-26 DIAGNOSIS — M545 Low back pain, unspecified: Secondary | ICD-10-CM | POA: Diagnosis not present

## 2023-07-26 DIAGNOSIS — M47816 Spondylosis without myelopathy or radiculopathy, lumbar region: Secondary | ICD-10-CM | POA: Diagnosis not present

## 2023-07-26 MED ORDER — CELECOXIB 200 MG PO CAPS: 200.0000 mg | ORAL_CAPSULE | Freq: Two times a day (BID) | ORAL | 0 refills | Status: AC

## 2023-07-26 NOTE — Progress Notes (Signed)
   REFERRING PHYSICIAN:  Bosie Clos, Md [address] [place],  [place] [address]  DOS: 06/18/23  L4-L5 XLIF and posterior spinal fusion   HISTORY OF PRESENT ILLNESS: TITANNA AUGSPURGER is status post above surgery.   She has been dealing with some pain, though her back pain is improved compared to before surgery.  She has not been taking any medications.   PHYSICAL EXAMINATION:  General: Patient is well developed, well nourished, calm, collected, and in no apparent distress.   NEUROLOGICAL:  General: In no acute distress.   Awake, alert, oriented to person, place, and time.  Pupils equal round and reactive to light.  Facial tone is symmetric.     Strength:            Side Iliopsoas Quads Hamstring PF DF EHL  R 3 4 5 5 5 5   L 5 5 5 5 4 5    Incisions R posterior - shows some superficial dehiscence with some drainage.   ROS (Neurologic):  Negative except as noted above  IMAGING: No complications noted.  ASSESSMENT/PLAN:  CASSIDI GUYMON is doing fair s/p above surgery.  She is continuing physical therapy for now.  I would like to try a small amount of Celebrex to see if that may help.    We will utilize Medihoney with daily dressing changes.  I would l like her to come back for wound check in approximately 2 weeks.     Venetia Night MD Department of Neurosurgery

## 2023-07-31 ENCOUNTER — Other Ambulatory Visit: Payer: Self-pay | Admitting: Family Medicine

## 2023-08-02 ENCOUNTER — Other Ambulatory Visit: Payer: Self-pay | Admitting: Neurosurgery

## 2023-08-02 ENCOUNTER — Ambulatory Visit
Admission: RE | Admit: 2023-08-02 | Discharge: 2023-08-02 | Disposition: A | Discharge status: Home / Self Care | Payer: PPO | Attending: Neurosurgery | Admitting: Neurosurgery

## 2023-08-02 ENCOUNTER — Telehealth: Payer: Self-pay | Admitting: Neurosurgery

## 2023-08-02 ENCOUNTER — Ambulatory Visit
Admission: RE | Admit: 2023-08-02 | Discharge: 2023-08-02 | Disposition: A | Discharge status: Home / Self Care | Payer: PPO | Source: Ambulatory Visit | Attending: Neurosurgery | Admitting: Neurosurgery

## 2023-08-02 DIAGNOSIS — M48061 Spinal stenosis, lumbar region without neurogenic claudication: Secondary | ICD-10-CM | POA: Insufficient documentation

## 2023-08-02 DIAGNOSIS — M51369 Other intervertebral disc degeneration, lumbar region without mention of lumbar back pain or lower extremity pain: Secondary | ICD-10-CM

## 2023-08-02 DIAGNOSIS — Z981 Arthrodesis status: Secondary | ICD-10-CM

## 2023-08-02 DIAGNOSIS — M47816 Spondylosis without myelopathy or radiculopathy, lumbar region: Secondary | ICD-10-CM | POA: Diagnosis not present

## 2023-08-02 DIAGNOSIS — M8580 Other specified disorders of bone density and structure, unspecified site: Secondary | ICD-10-CM | POA: Diagnosis not present

## 2023-08-02 DIAGNOSIS — M545 Low back pain, unspecified: Secondary | ICD-10-CM | POA: Diagnosis not present

## 2023-08-02 IMAGING — CT CT ANKLE*L* W/O CM
4 of 6 series · 14 of 33 positions shown, 16 images · non-contrast
Comparison: X-ray ankle 10/12/2021.

CLINICAL DATA: Left ankle pain related to a fall 3 weeks ago.
History of ankle surgery.

EXAM:
CT OF THE LEFT ANKLE WITHOUT CONTRAST
TECHNIQUE: Multidetector CT imaging of the left ankle was performed according
to the standard protocol. Multiplanar CT image reconstructions were
also generated.
RADIATION DOSE REDUCTION: This exam was performed according to the
departmental dose-optimization program which includes automated
exposure control, adjustment of the mA and/or kV according to
patient size and/or use of iterative reconstruction technique.

[Series 4: axial bone · axial · 0.26mm/px · z∈[-253,-121]mm · 5 of 201 slices shown, 7 images]
[im 34/201  soft-tissue]
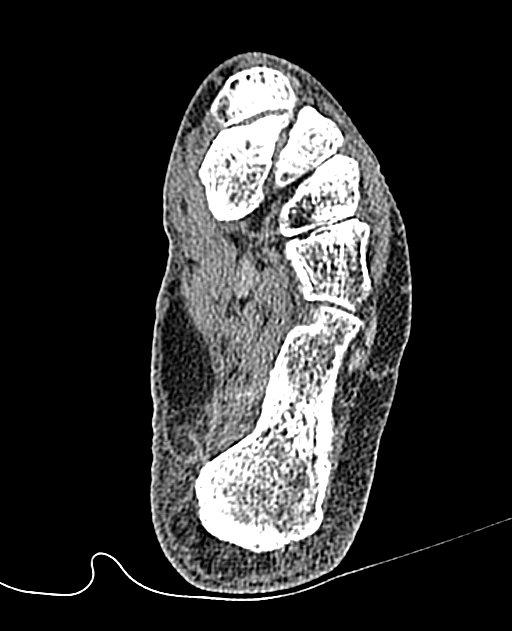
[im 34/201  bone]
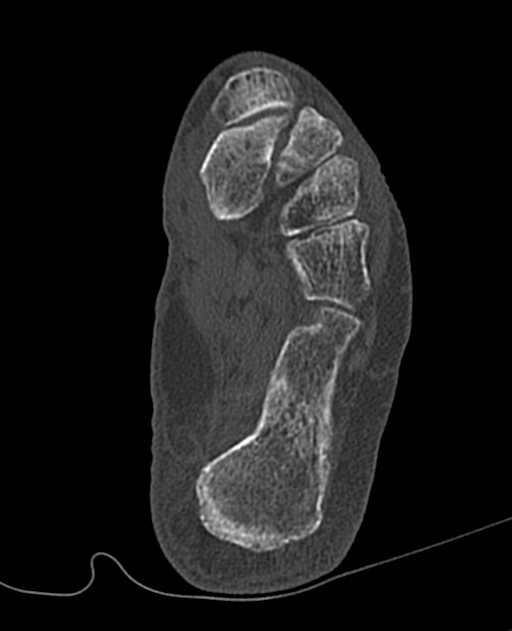
[im 67/201  bone]
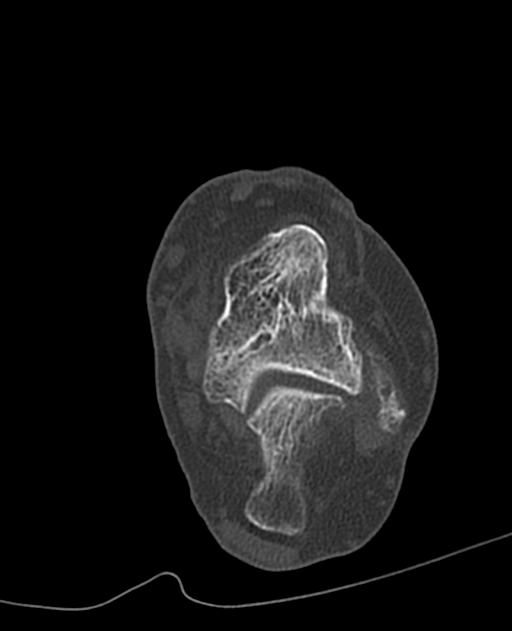
[im 101/201  bone]
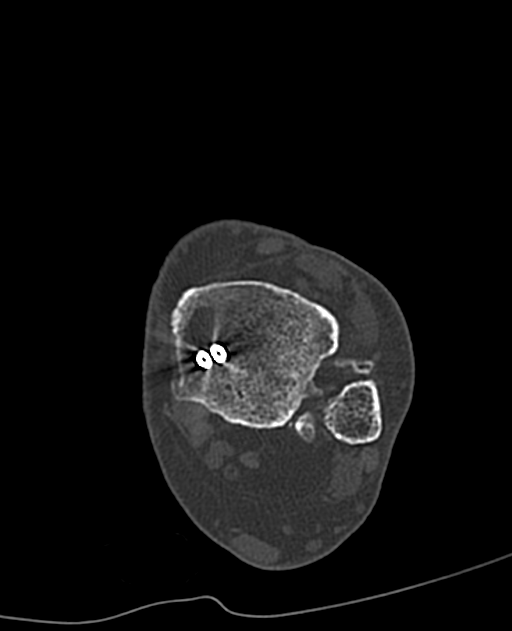
[im 134/201  bone]
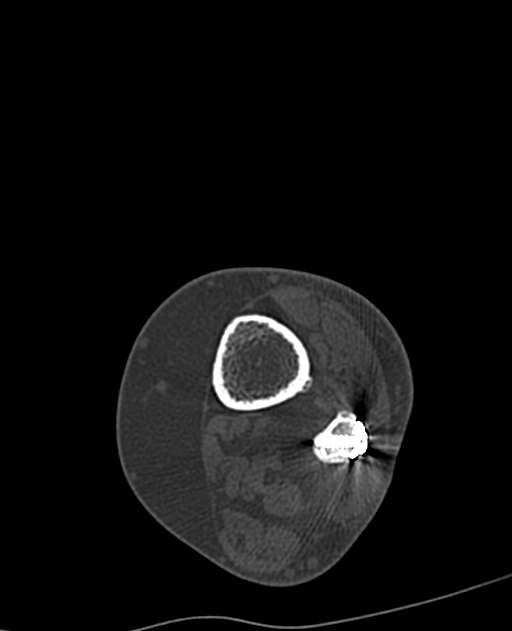
[im 167/201  soft-tissue]
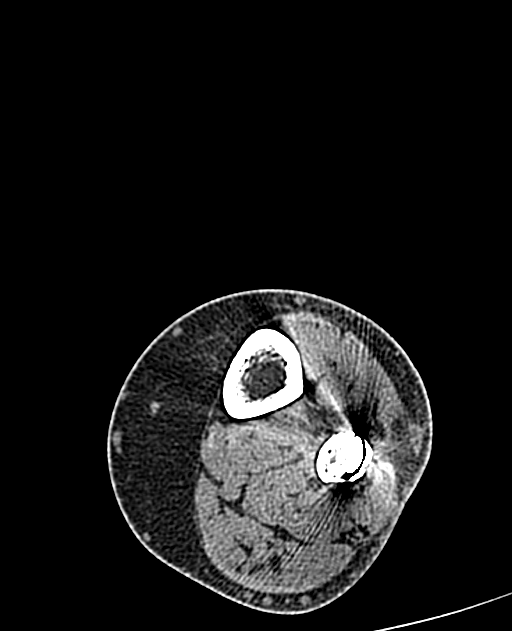
[im 167/201  bone]
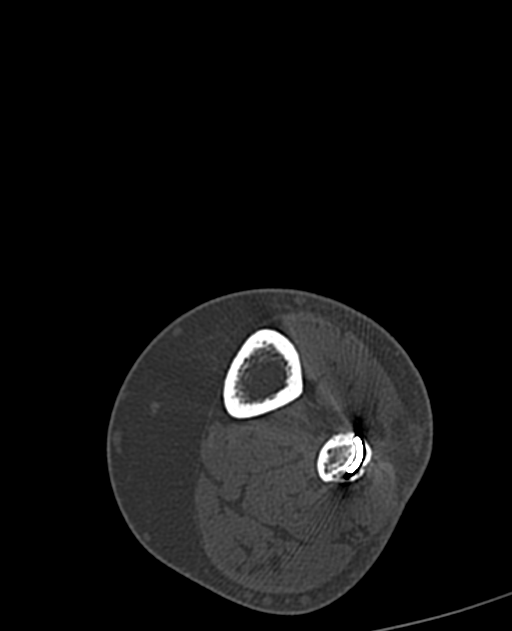

[Series 5: axial st · axial · 0.26mm/px · z∈[-253,-186]mm · 3 of 201 slices shown]
[im 34/201  bone]
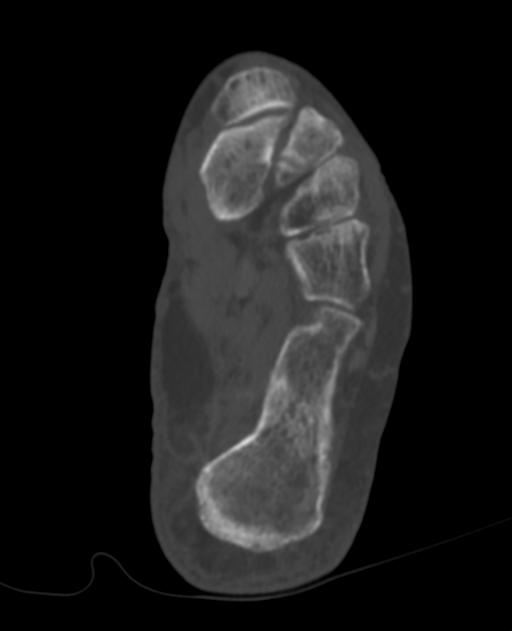
[im 67/201  bone]
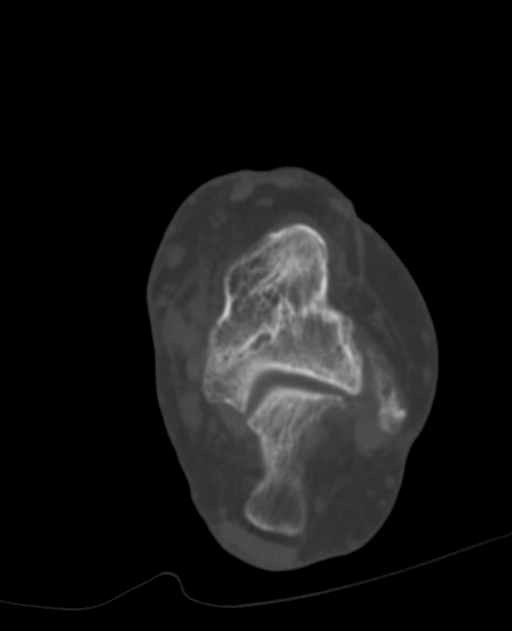
[im 101/201  bone]
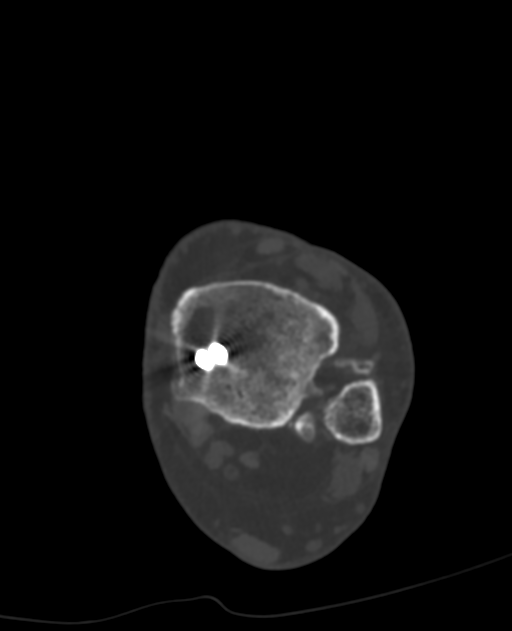

[Series 6: cor bone · coronal · 0.26mm/px · 1 of 165 slices shown]
[im 83/165  bone]
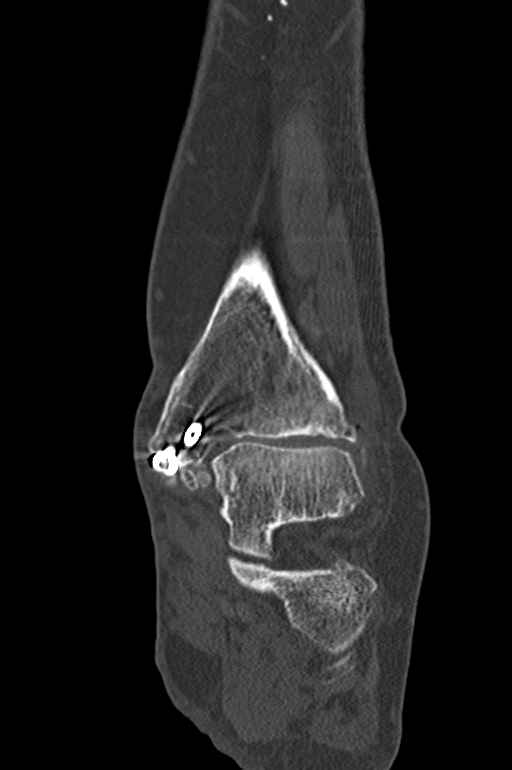

[Series 8: sag bone · sagittal · 0.35mm/px · 5 of 94 slices shown]
[im 16/94  bone]
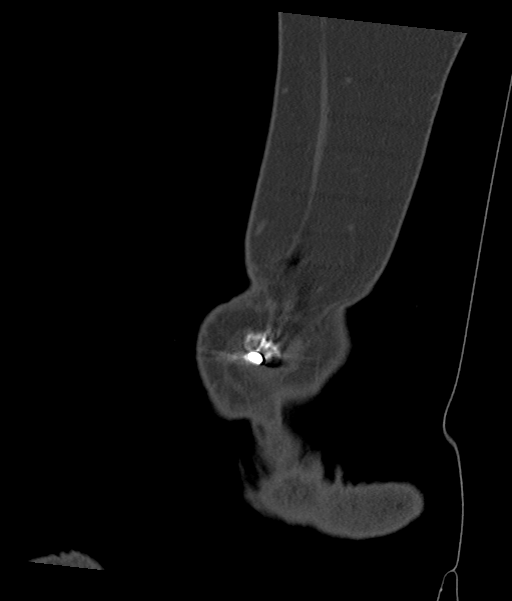
[im 32/94  bone]
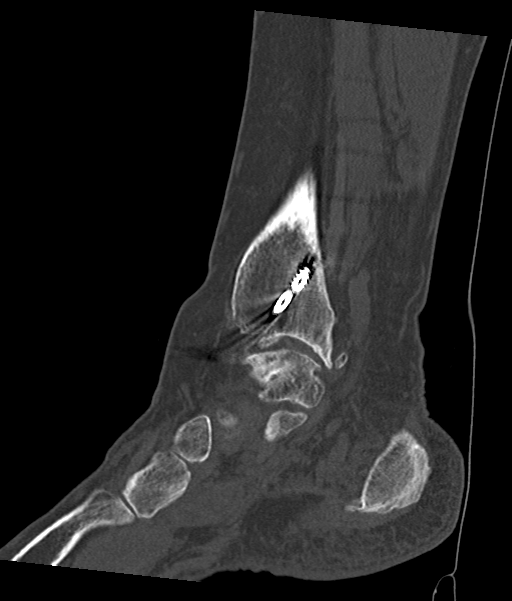
[im 47/94  bone]
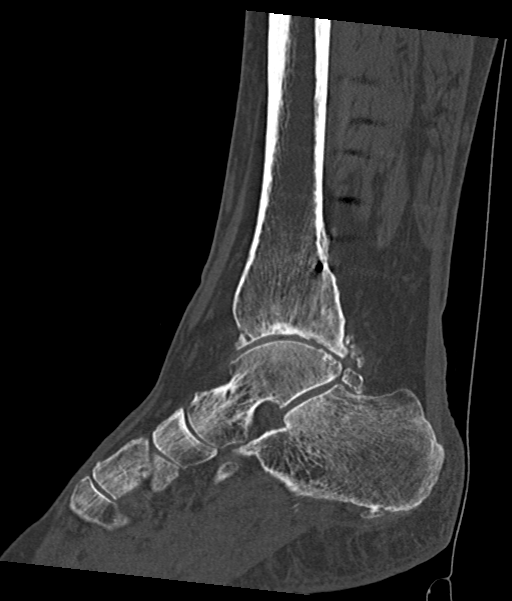
[im 63/94  bone]
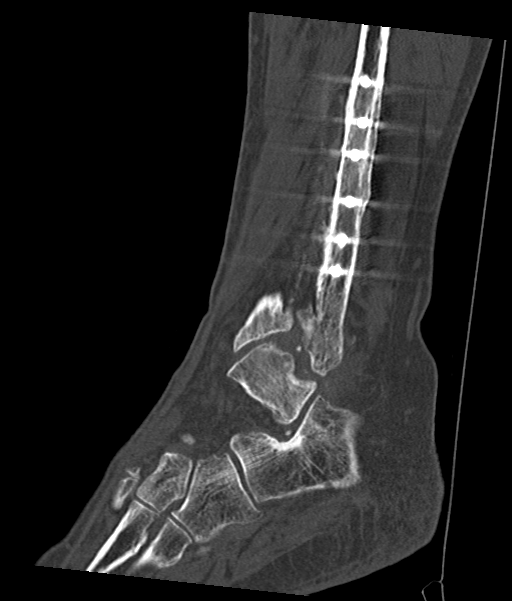
[im 78/94  bone]
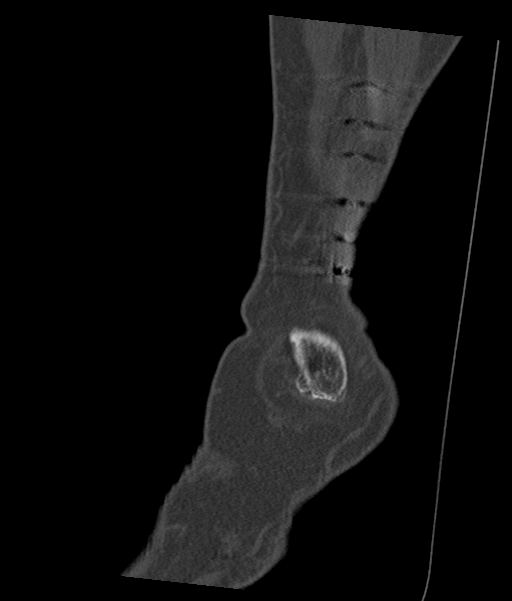

[14 of 33 positions shown; findings below may reference images not displayed]

FINDINGS: Bones/Joint/Cartilage

Postsurgical changes of prior ORIF involving the distal left fibula
and medial malleolus. Hardware appears intact. No perihardware
lucency or fracture identified. Chronic fragmentation in the region
of the distal tibiofibular joint as well as adjacent to the medial,
lateral, and posterior malleoli. Moderate-severe tibiotalar
osteoarthritis with joint space loss. Bulky posterior tibiotalar
joint osteophytes. No large effusion. Mild subtalar osteoarthritis.
Joint spaces within the midfoot are relatively preserved. No
evidence of acute fracture. No dislocation.

Ligaments

Suboptimally assessed by CT.

Muscles and Tendons

No acute musculotendinous abnormality by CT.

Soft tissues

No soft tissue swelling or fluid collection.
IMPRESSION: 1. No acute fracture or dislocation of the left ankle.
2. Postsurgical changes of prior ORIF involving the distal left
fibula and medial malleolus without evidence of hardware
complication.
3. Moderate-severe tibiotalar osteoarthritis.

## 2023-08-02 MED ORDER — METHOCARBAMOL 500 MG PO TABS: 500.0000 mg | ORAL_TABLET | Freq: Four times a day (QID) | ORAL | 0 refills | Status: DC

## 2023-08-02 MED ORDER — CELECOXIB 100 MG PO CAPS: 100.0000 mg | ORAL_CAPSULE | Freq: Two times a day (BID) | ORAL | 0 refills | Status: DC

## 2023-08-02 NOTE — Telephone Encounter (Signed)
L4-5 XLIF/PSF on 06/18/23 Teresa [name] from [facility] phone [phone] Patient had a fall on 07/28/23, she fell forward. Now she is having right low back pain on the side where the hardware is. After she fell she had to crawl 20 feet to get to a chair to help get up. Her pain has increased since Tuesday. Visually there is nothing wrong with her,only the pain. What would Dr.Yarbrough recommend she do? The incision is looking better.

## 2023-08-02 NOTE — Telephone Encounter (Signed)
Xray has been completed

## 2023-08-02 NOTE — Telephone Encounter (Signed)
  Fax from [facility] in [place] dated 08/01/2023

## 2023-08-02 NOTE — Telephone Encounter (Signed)
See other telephone call from 08/02/23

## 2023-08-02 NOTE — Telephone Encounter (Signed)
Spoke with Teresa [name] on the phone to go over her x-ray results and inform her that her medications have been refilled. I told her to contact us if she had any further concerns.

## 2023-08-02 NOTE — Telephone Encounter (Signed)
I spoke with the patient. She confirmed her pain is in her right lower back since she fell on 07/28/23. She was in a lot of pain last night.  She tried taking tramadol, but it isn't doing much for her; this wasn't helping her much before the fall, either.   She is taking: Tylenol Gabapentin 800mg  TID Tramadol 50mg  every 6 hours as needed (was given 30 tablets in July)   She would like to get xrays today and try a refill of methocarbamol and celebrex.  [facility] in [place]  She is scheduled to see Dr Myer Haff on 08/09/23 for a recheck. I have asked her to call me today after she completes the xray so Duwayne Heck can look at it.

## 2023-08-03 ENCOUNTER — Other Ambulatory Visit: Payer: Self-pay | Admitting: Neurosurgery

## 2023-08-03 MED ORDER — CELECOXIB 200 MG PO CAPS: 200.0000 mg | ORAL_CAPSULE | Freq: Every day | ORAL | 0 refills | Status: DC

## 2023-08-09 ENCOUNTER — Encounter: Payer: Self-pay | Admitting: Neurosurgery

## 2023-08-09 ENCOUNTER — Ambulatory Visit: Payer: PPO | Admitting: Neurosurgery

## 2023-08-09 VITALS — BP 116/74 | Ht 62.0 in | Wt 184.0 lb

## 2023-08-09 DIAGNOSIS — M48061 Spinal stenosis, lumbar region without neurogenic claudication: Secondary | ICD-10-CM

## 2023-08-09 DIAGNOSIS — M51369 Other intervertebral disc degeneration, lumbar region without mention of lumbar back pain or lower extremity pain: Secondary | ICD-10-CM

## 2023-08-09 DIAGNOSIS — Z981 Arthrodesis status: Secondary | ICD-10-CM

## 2023-08-09 NOTE — Progress Notes (Signed)
   REFERRING PHYSICIAN:  Bertrum Charlie CROME, Md [address] [place],  [place] [address]  DOS: 06/18/23  L4-L5 XLIF and posterior spinal fusion   HISTORY OF PRESENT ILLNESS: Teresa [name] is status post above surgery.   She has been dealing with some pain, though it is improving.    PHYSICAL EXAMINATION:  General: Patient is well developed, well nourished, calm, collected, and in no apparent distress.  Incision is healing - area of dehiscence has scabbed over  ROS (Neurologic):  Negative except as noted above  IMAGING: No complications noted.  ASSESSMENT/PLAN:  Teresa [name] is doing fair s/p above surgery.    We will see her back in 4 weeks.  Reeves Daisy MD Department of Neurosurgery

## 2023-08-17 ENCOUNTER — Emergency Department: Payer: PPO

## 2023-08-17 ENCOUNTER — Telehealth: Payer: Self-pay | Admitting: Neurosurgery

## 2023-08-17 ENCOUNTER — Emergency Department
Admission: EM | Admit: 2023-08-17 | Discharge: 2023-08-17 | Disposition: A | Discharge status: Home / Self Care | Payer: PPO | Attending: Emergency Medicine | Admitting: Emergency Medicine

## 2023-08-17 ENCOUNTER — Other Ambulatory Visit: Payer: Self-pay

## 2023-08-17 DIAGNOSIS — R519 Headache, unspecified: Secondary | ICD-10-CM | POA: Diagnosis present

## 2023-08-17 DIAGNOSIS — Z8673 Personal history of transient ischemic attack (TIA), and cerebral infarction without residual deficits: Secondary | ICD-10-CM | POA: Insufficient documentation

## 2023-08-17 DIAGNOSIS — W1830XA Fall on same level, unspecified, initial encounter: Secondary | ICD-10-CM | POA: Diagnosis not present

## 2023-08-17 DIAGNOSIS — S060XAA Concussion with loss of consciousness status unknown, initial encounter: Secondary | ICD-10-CM

## 2023-08-17 DIAGNOSIS — Z7901 Long term (current) use of anticoagulants: Secondary | ICD-10-CM | POA: Diagnosis not present

## 2023-08-17 DIAGNOSIS — Y92002 Bathroom of unspecified non-institutional (private) residence single-family (private) house as the place of occurrence of the external cause: Secondary | ICD-10-CM | POA: Insufficient documentation

## 2023-08-17 DIAGNOSIS — S060X0A Concussion without loss of consciousness, initial encounter: Secondary | ICD-10-CM | POA: Insufficient documentation

## 2023-08-17 DIAGNOSIS — W19XXXA Unspecified fall, initial encounter: Secondary | ICD-10-CM

## 2023-08-17 HISTORY — DX: Concussion with loss of consciousness status unknown, initial encounter: S06.0XAA

## 2023-08-17 LAB — BASIC METABOLIC PANEL
Anion gap: 13 (ref 5–15)
BUN: 18 mg/dL (ref 8–23)
CO2: 21 mmol/L — ABNORMAL LOW (ref 22–32)
Calcium: 9 mg/dL (ref 8.9–10.3)
Chloride: 107 mmol/L (ref 98–111)
Creatinine, Ser: 0.63 mg/dL (ref 0.44–1.00)
GFR, Estimated: >60 mL/min (ref >60)
Glucose, Bld: 96 mg/dL (ref 70–99)
Potassium: 3.8 mmol/L (ref 3.5–5.1)
Sodium: 141 mmol/L (ref 135–145)

## 2023-08-17 LAB — CBC
HCT: 44.1 % (ref 36.0–46.0)
Hemoglobin: 14.2 g/dL (ref 12.0–15.0)
MCH: 30.2 pg (ref 26.0–34.0)
MCHC: 32.2 g/dL (ref 30.0–36.0)
MCV: 93.8 fL (ref 80.0–100.0)
Platelets: 238 10*3/uL (ref 150–400)
RBC: 4.7 MIL/uL (ref 3.87–5.11)
RDW: 15.1 % (ref 11.5–15.5)
WBC: 9.4 10*3/uL (ref 4.0–10.5)
nRBC: 0 % (ref 0.0–0.2)

## 2023-08-17 LAB — TROPONIN I (HIGH SENSITIVITY)
Troponin I (High Sensitivity): 6 ng/L (ref <18)
Troponin I (High Sensitivity): 5 ng/L (ref <18)

## 2023-08-17 NOTE — Telephone Encounter (Addendum)
 I spoke with the patient and [name] w/ [facility] while she was with the patient. Teresa [name] fell a couple of hours ago. She hit the right side of her head right above her eye, her right shoulder and back. She did not lose consciousness. She is on Eliquis  twice per day. Per [name] reinhold Deiters, she seems like her self in regards to cognition. She has been falling daily since her stroke in 2022, but they have been increasing in frequency lately.  She lives alone.  [name] doesn't see any bruising or swelling. She has a mild headache. Her right shoulder is hurting where she hit it on the wall and she is having trouble moving it.  Her low back is the worst part of the pain, but she is able to walk. Her back pain is a 10/10. She denies any leg symptoms.

## 2023-08-17 NOTE — Telephone Encounter (Signed)
 Per discussion with Dr Claudene, he recommends evaluation in the ER, particularly due to being on Eliquis  and hitting her head. I have notified Ms Foland and [name] w/ [employer]. Ms Murchison agreed to go to the ER. I advised her to contact us  next week if she still needs to be seen for her back pain after being evaluated in the ER.

## 2023-08-17 NOTE — ED Triage Notes (Addendum)
 Pt is coming from home, c/o fell and hit her head @11am  this AM. Pt reports taking eliquis . Pt reports severe HA and dizziness. Pt states she falls often at home, has worsened since stroke. Pt denies N/V, vision changes. PMH: stroke 2022, a-fib. gCS 15, ambulatory

## 2023-08-17 NOTE — ED Provider Notes (Addendum)
 [facility] Provider Note    Event Date/Time   First MD Initiated Contact with Patient 08/17/23 1603     (approximate)   History   Fall and Headache   HPI  Teresa [name] is a 78 y.o. female who is on Eliquis  who comes in for a fall around 11 AM.  Patient reports a headache and with some dizziness since the fall.  Patient does have a history of stroke, A-fib.  Patient reports that she was trying to use the bathroom and pulled out her pants when she fell down and hit her head.  Afterwards she developed a headache and some dizziness.  She denies any dizziness, chest pain, shortness of breath prior to the fall.  She states that she landed on her right shoulder and right side and her back.  She reports some lower back pain.  She reports difficulty lifting up her right leg but that is chronic secondary to prior back surgery.  Physical Exam   Triage Vital Signs: ED Triage Vitals  Encounter Vitals Group     BP 08/17/23 1515 127/77     Systolic BP Percentile --      Diastolic BP Percentile --      Pulse Rate 08/17/23 1515 64     Resp 08/17/23 1515 18     Temp 08/17/23 1515 98.5 F (36.9 C)     Temp Source 08/17/23 1515 Oral     SpO2 08/17/23 1515 99 %     Weight --      Height --      Head Circumference --      Peak Flow --      Pain Score 08/17/23 1516 10     Pain Loc --      Pain Education --      Exclude from Growth Chart --     Most recent vital signs: Vitals:   08/17/23 1515  BP: 127/77  Pulse: 64  Resp: 18  Temp: 98.5 F (36.9 C)  SpO2: 99%     General: Awake, no distress.  CV:  Good peripheral perfusion.  Resp:  Normal effort.  Some mild chest wall tenderness without any bruising noted Abd:  No distention.  Soft and nontender Other:  Right shoulder tenderness.  Able to lift up both legs up off the bed but right leg is slightly less secondary to prior surgery.  She is got some mild C-spine L-spine tenderness.  No T-spine  tenderness.   ED Results / Procedures / Treatments   Labs (all labs ordered are listed, but only abnormal results are displayed) Labs Reviewed  BASIC METABOLIC PANEL - Abnormal; Notable for the following components:      Result Value   CO2 21 (*)    All other components within normal limits  CBC  URINALYSIS, ROUTINE W REFLEX MICROSCOPIC  CBG MONITORING, ED  TROPONIN I (HIGH SENSITIVITY)     EKG  My interpretation of EKG:  Normal sinus rhythm 61 without any ST elevation, T wave inversions in the lead III, V2, V3, V4 V5, normal intervals..  Reviewed prior EKG from 2024 where patient has some similar T wave inversions.  RADIOLOGY I have reviewed the CT head personally interpreted no evidence of intercranial hemorrhage   PROCEDURES:  Critical Care performed: No  Procedures   MEDICATIONS ORDERED IN ED: Medications - No data to display   IMPRESSION / MDM / ASSESSMENT AND PLAN / ED COURSE  I reviewed the  triage vital signs and the nursing notes.   Patient's presentation is most consistent with acute presentation with potential threat to life or bodily function.  Patient comes in with a fall.  Sounds mechanical in nature and she reports multiple episodes of falls previously.  She reports dizziness, headache started after the fall not before.  Will get EKG, cardiac markers, basic labs given the dizziness just to ensure no evidence of ACS, electrolyte abnormalities, CT imaging evaluate for intracranial hemorrhage, spine fractures, x-ray to evaluate for any fractures  CBC is reassuring.  BMP is normal.  Troponin negative x 2  Xray negative  CT imaging negative  Workup reassuring.  Will ambulate patient and discharge patient home.  Patient reports a little bit of dizziness when she stands up.  She denies any dizziness at rest.  Cranial nerves II to XII are intact.  Equal strength in arms and legs.  Finger-nose intact bilaterally.  Patient was able to get up and ambulate  with a walker.  We discussed admission for PT, OT versus going home patient felt comfortable with going home at this time.  We discussed talking with her cardiologist about whether or not she should hold her Eliquis  given she is on it for A-fib.  Does appear that she has had a prior stroke and so this will be needed to be a discussion with them.  She expressed understanding and felt comfortable with this plan.  The patient is on the cardiac monitor to evaluate for evidence of arrhythmia and/or significant heart rate changes.      FINAL CLINICAL IMPRESSION(S) / ED DIAGNOSES   Final diagnoses:  Fall, initial encounter  Concussion without loss of consciousness, initial encounter     Rx / DC Orders   ED Discharge Orders     None        Note:  This document was prepared using Dragon voice recognition software and may include unintentional dictation errors.   Ernest Ronal BRAVO, MD 08/17/23 [place]    Ernest Ronal BRAVO, MD 08/17/23 [phone]

## 2023-08-17 NOTE — Discharge Instructions (Addendum)
 Your workup was reassuring you can take Tylenol 1 g every 8 hours to help with pain.  .  Return to the ER if you develop worsening symptoms or any other concerns.  I suspect that some of your blurred vision/headache is related to a concussion.

## 2023-08-17 NOTE — Telephone Encounter (Signed)
 [name] from [place] is at the patient's house for physical therapy and is calling to report a fall the patient had earlier today in the bathroom. She states that the patient is complaining of 10/10 back pain and is unable to lift her right shoulder above 70 degrees. [name] states that there is no noted redness or swelling, just very painful. [name] also states that she noted at last week's visit that all of a sudden that she lurches to right and hit the wall. She is concerned that these sudden lurches are what are causing the patient to fall. Please advise.

## 2023-08-20 ENCOUNTER — Telehealth: Payer: Self-pay | Admitting: Neurosurgery

## 2023-08-20 NOTE — Telephone Encounter (Signed)
 Patient fell on Friday and went to the ER for evaluation. She has several images done in ER, She states she still has a headache from the concussion of that fall. On Saturday she fell on her bottom when trying to go start her car and crawled back to the house. She states she does not think she hurt herself with that fall. She is 2 month post op. Do you think she should have any more images?

## 2023-08-20 NOTE — Telephone Encounter (Signed)
 I spoke to patient and [name] at [place] and informed both of them that she does not need any additional images at this time and the images from Friday are ok. I informed them that if Mrs.Alexa continues to have falls she will need to see her PCP to be evaluated. The both voiced understanding.

## 2023-08-20 NOTE — Telephone Encounter (Signed)
 [name] from [place] is calling to let our office know that the patient did go to the ER as instructed and was noted to have a concussion. [name] would like to report that the patient had another fall on Saturday. She states that the patient slipped on the ice trying to go out and start her car. [name] finally states that these falls have been ongoing since 2022 when the patient had a stroke.   [phone]

## 2023-08-21 NOTE — Progress Notes (Signed)
  Electrophysiology Office Follow up Visit Note:    Date:  08/22/2023   ID:  Teresa [name], DOB 1945/09/11, MRN [id number]  PCP:  Nikki Barters, MD  [facility] Cardiologist:  None  [facility] Electrophysiologist:  Boyce Byes, MD    Interval History:     Teresa [name] is a 78 y.o. female who presents for a follow up visit.   I last met the patient Jan 04, 2022.  She has a history of COPD, seizure disorder and atrial fibrillation.  At the last appointment we discussed rhythm control options.  We decided to first quantify the burden of atrial fibrillation using a ZIO monitor.  She wore a ZIO monitor in the summer 2023 which showed no atrial fibrillation.  Continue medical therapy was recommended.  She takes Eliquis  for stroke prophylaxis.  She was seen in the emergency department August 17, 2023 after a fall.  She was discharged after being evaluated in the ER.  Today she tells me that she has had a slow recovery after a November hospitalization for gastritis.  She continues to have intermittent GI upset.  She does not think it is because of her Jardiance  or Ozempic .  She continues to take Eliquis  twice daily for stroke prophylaxis without bleeding issues.  That she does endorse some shortness of breath and decreased exercise tolerance.  No fluid retention.  Was told during her November hospitalization that she had "heart failure".      Past medical, surgical, social and family history were reviewed.  ROS:   Please see the history of present illness.    All other systems reviewed and are negative.  EKGs/Labs/Other Studies Reviewed:    The following studies were reviewed today:  January 27, 2022 ZIO monitor personally reviewed No atrial fibrillation  August 20, 2023 EKG shows sinus rhythm, right bundle branch block      Physical Exam:    VS:  BP 104/60 (BP Location: Left Arm, Patient Position: Sitting, Cuff Size: Normal)   Pulse 74   Ht 5\' 2"  (1.575 m)   Wt  176 lb 8 oz (80.1 kg)   SpO2 96%   BMI 32.28 kg/m     Wt Readings from Last 3 Encounters:  08/22/23 176 lb 8 oz (80.1 kg)  08/09/23 184 lb (83.5 kg)  07/26/23 184 lb (83.5 kg)     GEN: no distress CARD: RRR, No MRG.  No edema RESP: No IWOB. CTAB.      ASSESSMENT:    1. Paroxysmal atrial fibrillation (HCC)   2. Chronic diastolic CHF (congestive heart failure) (HCC)    PLAN:    In order of problems listed above:  #Paroxysmal atrial fibrillation Low burden.  On Eliquis  for stroke prophylaxis  #Chronic diastolic heart failure NYHA class II.  Warm and dry on exam. Continue Jardiance , Lasix  Repeat echo today given her report of intermittent shortness of breath  Follow-up with EP APP in 6 months or sooner based on echo results.  Signed, Harvie Liner, MD, [facility], [facility] 08/22/2023 10:29 AM    Electrophysiology [facility]

## 2023-08-22 ENCOUNTER — Encounter: Payer: Self-pay | Admitting: Cardiology

## 2023-08-22 ENCOUNTER — Ambulatory Visit: Payer: PPO | Attending: Cardiology | Admitting: Cardiology

## 2023-08-22 ENCOUNTER — Encounter: Payer: Self-pay | Admitting: Podiatry

## 2023-08-22 ENCOUNTER — Ambulatory Visit (INDEPENDENT_AMBULATORY_CARE_PROVIDER_SITE_OTHER): Payer: PPO | Admitting: Podiatry

## 2023-08-22 ENCOUNTER — Ambulatory Visit (INDEPENDENT_AMBULATORY_CARE_PROVIDER_SITE_OTHER): Payer: PPO

## 2023-08-22 VITALS — BP 104/60 | HR 74 | Ht 62.0 in | Wt 176.5 lb

## 2023-08-22 VITALS — Ht 62.0 in | Wt 176.5 lb

## 2023-08-22 DIAGNOSIS — M21762 Unequal limb length (acquired), left tibia: Secondary | ICD-10-CM

## 2023-08-22 DIAGNOSIS — B351 Tinea unguium: Secondary | ICD-10-CM

## 2023-08-22 DIAGNOSIS — I480 Paroxysmal atrial fibrillation: Secondary | ICD-10-CM | POA: Diagnosis not present

## 2023-08-22 DIAGNOSIS — M2141 Flat foot [pes planus] (acquired), right foot: Secondary | ICD-10-CM | POA: Diagnosis not present

## 2023-08-22 DIAGNOSIS — M19172 Post-traumatic osteoarthritis, left ankle and foot: Secondary | ICD-10-CM

## 2023-08-22 DIAGNOSIS — M2142 Flat foot [pes planus] (acquired), left foot: Secondary | ICD-10-CM

## 2023-08-22 DIAGNOSIS — M79676 Pain in unspecified toe(s): Secondary | ICD-10-CM | POA: Diagnosis not present

## 2023-08-22 DIAGNOSIS — E1140 Type 2 diabetes mellitus with diabetic neuropathy, unspecified: Secondary | ICD-10-CM

## 2023-08-22 DIAGNOSIS — Z9889 Other specified postprocedural states: Secondary | ICD-10-CM

## 2023-08-22 DIAGNOSIS — I5032 Chronic diastolic (congestive) heart failure: Secondary | ICD-10-CM

## 2023-08-22 DIAGNOSIS — M79672 Pain in left foot: Secondary | ICD-10-CM

## 2023-08-22 NOTE — Patient Instructions (Signed)
 Medication Instructions:  Your physician recommends that you continue on your current medications as directed. Please refer to the Current Medication list given to you today.   *If you need a refill on your cardiac medications before your next appointment, please call your pharmacy*  Testing/Procedures: Your physician has requested that you have an echocardiogram. Echocardiography is a painless test that uses sound waves to create images of your heart. It provides your doctor with information about the size and shape of your heart and how well your heart's chambers and valves are working. This procedure takes approximately one hour. There are no restrictions for this procedure. Please do NOT wear cologne, perfume, aftershave, or lotions (deodorant is allowed). Please arrive 15 minutes prior to your appointment time.  Please note: We ask at that you not bring children with you during ultrasound (echo/ vascular) testing. Due to room size and safety concerns, children are not allowed in the ultrasound rooms during exams. Our front office staff cannot provide observation of children in our lobby area while testing is being conducted. An adult accompanying a patient to their appointment will only be allowed in the ultrasound room at the discretion of the ultrasound technician under special circumstances. We apologize for any inconvenience.  Follow-Up: At [facility], you and your health needs are our priority.  As part of our continuing mission to provide you with exceptional heart care, we have created designated Provider Care Teams.  These Care Teams include your primary Cardiologist (physician) and Advanced Practice Providers (APPs -  Physician Assistants and Nurse Practitioners) who all work together to provide you with the care you need, when you need it.  Your next appointment:   6 months  Provider:   Suzann Riddle, NP

## 2023-08-26 NOTE — Progress Notes (Signed)
  Subjective:  Patient ID: Teresa [name], female    DOB: 07/24/1946,  MRN: [id number]  Chief Complaint  Patient presents with   Routine Post Op    Pt is here for [facility] and routine post op visit for surgery on left ankle.    78 y.o. female presents with the above complaint. History confirmed with patient.  Nails are thickened elongated and causing discomfort in shoe gear.  Her blood sugar remains well-controlled.  She notes some difficulty walking and imbalance and pain in the hip  Objective:  Physical Exam: warm, good capillary refill, no trophic changes or ulcerative lesions, normal DP and PT pulses, and varicose veins noted, thickened elongated yellow dystrophic discolored nails bilaterally x 10 with subungual debris, mild ingrowing nail without infection or paronychia on the right side.  No pain to palpation around the left ankle joint, well-healed surgical scars.  She has a 1 cm shorter left lower extremity in the tibial section compared to the right  Right ankle radiographs today show unchanged alignment good consolidation across fusion site hardware intact and in position  Assessment:   1. Post-traumatic arthritis of left ankle   2. Pain due to onychomycosis of toenail      Plan:  Patient was evaluated and treated and all questions answered.  Discussed the etiology and treatment options for the condition in detail with the patient.  Recommended debridement of the nails today. Sharp and mechanical debridement performed of all painful and mycotic nails today. Nails debrided in length and thickness using a nail nipper to level of comfort. Discussed treatment options including appropriate shoe gear. Follow up as needed for painful nails.  Separately today we followed up on her ankle fusion and discussed her difficulty walking.  X-ray stable and has good consolidation and alignment of ankle arthrodesis.  She does have some limb length discrepancy here.  I placed additional heel lifting in  her shoe and she will be scheduled for diabetic shoe fitting and I would like this to be integrated into her shoe and the left side for 1 cm lift for the insert.   Return in about 3 months (around 11/20/2023) for at risk diabetic foot care.

## 2023-08-28 ENCOUNTER — Other Ambulatory Visit: Payer: Self-pay | Admitting: Pulmonary Disease

## 2023-08-28 DIAGNOSIS — J849 Interstitial pulmonary disease, unspecified: Secondary | ICD-10-CM

## 2023-08-28 DIAGNOSIS — R0602 Shortness of breath: Secondary | ICD-10-CM

## 2023-09-01 ENCOUNTER — Other Ambulatory Visit: Payer: Self-pay | Admitting: Neurosurgery

## 2023-09-05 ENCOUNTER — Ambulatory Visit: Payer: PPO | Attending: Cardiology

## 2023-09-05 DIAGNOSIS — I480 Paroxysmal atrial fibrillation: Secondary | ICD-10-CM

## 2023-09-05 DIAGNOSIS — I5032 Chronic diastolic (congestive) heart failure: Secondary | ICD-10-CM | POA: Diagnosis not present

## 2023-09-06 ENCOUNTER — Encounter: Payer: PPO | Admitting: Orthopedic Surgery

## 2023-09-06 LAB — ECHOCARDIOGRAM COMPLETE
AV Mean grad: 5 mm[Hg]
AV Peak grad: 9.4 mm[Hg]
Ao pk vel: 1.53 m/s
Area-P 1/2: 2.69 cm2
S' Lateral: 3.1 cm

## 2023-09-09 ENCOUNTER — Encounter: Payer: Self-pay | Admitting: Cardiology

## 2023-09-11 ENCOUNTER — Ambulatory Visit
Admission: RE | Admit: 2023-09-11 | Discharge: 2023-09-11 | Disposition: A | Discharge status: Home / Self Care | Payer: PPO | Source: Ambulatory Visit | Attending: Pulmonary Disease | Admitting: Pulmonary Disease

## 2023-09-11 DIAGNOSIS — J849 Interstitial pulmonary disease, unspecified: Secondary | ICD-10-CM | POA: Diagnosis present

## 2023-09-11 DIAGNOSIS — R0602 Shortness of breath: Secondary | ICD-10-CM

## 2023-09-12 ENCOUNTER — Other Ambulatory Visit: Payer: Self-pay

## 2023-09-12 DIAGNOSIS — M51369 Other intervertebral disc degeneration, lumbar region without mention of lumbar back pain or lower extremity pain: Secondary | ICD-10-CM

## 2023-09-13 ENCOUNTER — Ambulatory Visit
Admission: RE | Admit: 2023-09-13 | Discharge: 2023-09-13 | Disposition: A | Discharge status: Home / Self Care | Payer: PPO | Source: Ambulatory Visit | Attending: Neurosurgery | Admitting: Neurosurgery

## 2023-09-13 ENCOUNTER — Encounter: Payer: Self-pay | Admitting: Neurosurgery

## 2023-09-13 ENCOUNTER — Ambulatory Visit
Admission: RE | Admit: 2023-09-13 | Discharge: 2023-09-13 | Disposition: A | Discharge status: Home / Self Care | Payer: PPO | Attending: Neurosurgery | Admitting: Neurosurgery

## 2023-09-13 ENCOUNTER — Ambulatory Visit: Payer: PPO | Admitting: Neurosurgery

## 2023-09-13 VITALS — BP 112/74 | Ht 62.0 in | Wt 176.0 lb

## 2023-09-13 DIAGNOSIS — Z981 Arthrodesis status: Secondary | ICD-10-CM | POA: Insufficient documentation

## 2023-09-13 DIAGNOSIS — M48061 Spinal stenosis, lumbar region without neurogenic claudication: Secondary | ICD-10-CM

## 2023-09-13 DIAGNOSIS — R29898 Other symptoms and signs involving the musculoskeletal system: Secondary | ICD-10-CM

## 2023-09-13 DIAGNOSIS — Z09 Encounter for follow-up examination after completed treatment for conditions other than malignant neoplasm: Secondary | ICD-10-CM

## 2023-09-13 DIAGNOSIS — M51369 Other intervertebral disc degeneration, lumbar region without mention of lumbar back pain or lower extremity pain: Secondary | ICD-10-CM | POA: Diagnosis present

## 2023-09-13 DIAGNOSIS — R2689 Other abnormalities of gait and mobility: Secondary | ICD-10-CM

## 2023-09-13 NOTE — Progress Notes (Signed)
   REFERRING PHYSICIAN:  Bertrum Charlie CROME, Md [address] [place],  [place] [address]  DOS: 06/18/23  L4-L5 XLIF and posterior spinal fusion   HISTORY OF PRESENT ILLNESS: Teresa [name] is status post above surgery.   She feels that her symptoms are much improved.  She is currently just using Tylenol  for her pain.  She has had significant falls.  She reports dexterity changes.   PHYSICAL EXAMINATION:  General: Patient is well developed, well nourished, calm, collected, and in no apparent distress.  She has 5 out of 5 strength in her bilateral lower extremities.  In her right upper extremity, she has grip strength and interosseous weakness of 4 out of 5.  Incision c/d/i  ROS (Neurologic):  Negative except as noted above  IMAGING: No complications noted.  ASSESSMENT/PLAN:  Teresa [name] is doing better s/p above surgery.    We will see her back in 6 months for her back.  She showed evidence of right sided grip weakness and reports multiple falls with imbalance and an unsteady gait.  She is also having tingling and numbness in her hands.  She could be having cervical myelopathy.  I have recommended a cervical spine MRI scan.  I spent a total of 15 minutes in this patient's care today. This time was spent reviewing pertinent records including imaging studies, obtaining and confirming history, performing a directed evaluation, formulating and discussing my recommendations, and documenting the visit within the medical record.    Reeves Daisy MD Department of Neurosurgery

## 2023-09-20 ENCOUNTER — Ambulatory Visit
Admission: RE | Admit: 2023-09-20 | Discharge: 2023-09-20 | Disposition: A | Discharge status: Home / Self Care | Payer: PPO | Source: Ambulatory Visit | Attending: Neurosurgery | Admitting: Neurosurgery

## 2023-09-20 DIAGNOSIS — R2689 Other abnormalities of gait and mobility: Secondary | ICD-10-CM | POA: Insufficient documentation

## 2023-09-20 DIAGNOSIS — R29898 Other symptoms and signs involving the musculoskeletal system: Secondary | ICD-10-CM | POA: Insufficient documentation

## 2023-10-03 ENCOUNTER — Other Ambulatory Visit: Payer: Self-pay | Admitting: Family Medicine

## 2023-10-03 DIAGNOSIS — E118 Type 2 diabetes mellitus with unspecified complications: Secondary | ICD-10-CM

## 2023-10-03 DIAGNOSIS — E119 Type 2 diabetes mellitus without complications: Secondary | ICD-10-CM

## 2023-10-03 NOTE — Telephone Encounter (Unsigned)
 Copied from CRM [id number]. Topic: Clinical - Medication Refill >> Oct 03, 2023 12:50 PM Shelah Lewandowsky wrote: Most Recent Primary Care Visit:  Provider: Alfredia Ferguson  Department: [id number]  Visit Type: OFFICE VISIT  Date: 08/17/2022  Medication: allopurinol (ZYLOPRIM) 100 MG tablet furosemide (LASIX) 40 MG tablet gabapentin (NEURONTIN) 800 MG tablet empagliflozin (JARDIANCE) 25 MG TABS tablet levothyroxine (SYNTHROID) 88 MCG tablet pioglitazone (ACTOS) 15 MG tablet  Has the patient contacted their pharmacy? Yes (Agent: If no, request that the patient contact the pharmacy for the refill. If patient does not wish to contact the pharmacy document the reason why and proceed with request.) (Agent: If yes, when and what did the pharmacy advise?)  Is this the correct pharmacy for this prescription? Yes If no, delete pharmacy and type the correct one.  This is the patient's preferred pharmacy:   [facility] [place] - [address] [place] [address] Phone: [phone] Fax: [phone]   Has the prescription been filled recently? Yes  Is the patient out of the medication? No  Has the patient been seen for an appointment in the last year OR does the patient have an upcoming appointment? Yes  Can we respond through MyChart? Yes  Agent: Please be advised that Rx refills may take up to 3 business days. We ask that you follow-up with your pharmacy.

## 2023-10-09 ENCOUNTER — Ambulatory Visit (INDEPENDENT_AMBULATORY_CARE_PROVIDER_SITE_OTHER): Admitting: Neurosurgery

## 2023-10-09 ENCOUNTER — Other Ambulatory Visit: Payer: Self-pay

## 2023-10-09 VITALS — BP 126/76 | Ht 62.0 in | Wt 176.0 lb

## 2023-10-09 DIAGNOSIS — G9589 Other specified diseases of spinal cord: Secondary | ICD-10-CM | POA: Diagnosis not present

## 2023-10-09 DIAGNOSIS — Z01818 Encounter for other preprocedural examination: Secondary | ICD-10-CM

## 2023-10-09 DIAGNOSIS — M4802 Spinal stenosis, cervical region: Secondary | ICD-10-CM | POA: Diagnosis not present

## 2023-10-09 DIAGNOSIS — G959 Disease of spinal cord, unspecified: Secondary | ICD-10-CM

## 2023-10-09 DIAGNOSIS — R2689 Other abnormalities of gait and mobility: Secondary | ICD-10-CM

## 2023-10-09 NOTE — Progress Notes (Signed)
 REFERRING PHYSICIAN:  No referring provider defined for this encounter.  DOS: 06/18/23  L4-L5 XLIF and posterior spinal fusion   HISTORY OF PRESENT ILLNESS: Teresa [name] is status post above surgery.   She feels that her symptoms are much improved in her back.  She is currently just using Tylenol for her pain.  Unfortunately, she has continued to have problems with her balance.  She has dexterity changes.  She expresses weakness in her hands.  She also has numbness in her hands.   Family History  Problem Relation Age of Onset   Stroke Mother    Hypertension Mother    Heart disease Mother    Arthritis Mother    Heart disease Father    Hypertension Father    Diabetes Sister    Hypertension Sister    Asthma Sister    Hypertension Sister    Diverticulitis Sister    Colon cancer Maternal Grandmother    Breast cancer Maternal Grandmother    Breast cancer Maternal Aunt    Social History   Socioeconomic History   Marital status: Divorced    Spouse name: Not on file   Number of children: 2   Years of education: Not on file   Highest education level: Associate degree: occupational, Scientist, product/process development, or vocational program  Occupational History   Occupation: retired  Tobacco Use   Smoking status: Never   Smokeless tobacco: Never  Vaping Use   Vaping status: Never Used  Substance and Sexual Activity   Alcohol use: Never   Drug use: Never   Sexual activity: Not Currently  Other Topics Concern   Not on file  Social History Narrative   Lives alone   Social Drivers of Health   Financial Resource Strain: Medium Risk (08/23/2023)   Received from [facility]   Overall Financial Resource Strain (CARDIA)    Difficulty of Paying Living Expenses: Somewhat hard  Food Insecurity: No Food Insecurity (07/16/2023)   Hunger Vital Sign    Worried About Running Out of Food in the Last Year: Never true    Ran Out of Food in the Last Year: Never true  Recent Concern: Food  Insecurity - Food Insecurity Present (05/26/2023)   Received from [facility]   Hunger Vital Sign    Worried About Running Out of Food in the Last Year: Often true    Ran Out of Food in the Last Year: Often true  Transportation Needs: No Transportation Needs (07/16/2023)   PRAPARE - Administrator, Civil Service (Medical): No    Lack of Transportation (Non-Medical): No  Physical Activity: Sufficiently Active (09/19/2021)   Exercise Vital Sign    Days of Exercise per Week: 7 days    Minutes of Exercise per Session: 30 min  Stress: Stress Concern Present (09/19/2021)   [employer] of Occupational Health - Occupational Stress Questionnaire    Feeling of Stress : To some extent  Social Connections: Moderately Integrated (09/19/2021)   Social Connection and Isolation Panel [NHANES]    Frequency of Communication with Friends and Family: More than three times a week    Frequency of Social Gatherings with Friends and Family: More than three times a week    Attends Religious Services: More than 4 times per year    Active Member of [employer] or Organizations: Yes    Attends Engineer, structural: More than 4 times per year    Marital Status: Divorced  Catering manager  Violence: Not At Risk (07/16/2023)   Humiliation, Afraid, Rape, and Kick questionnaire    Fear of Current or Ex-Partner: No    Emotionally Abused: No    Physically Abused: No    Sexually Abused: No   Allergies  Allergen Reactions   Clarithromycin Nausea And Vomiting and Other (See Comments)   Codeine     Hallucination   Dilaudid  [Hydromorphone Hcl] Nausea And Vomiting   Iodine Hives and Itching   Iohexol Hives and Itching   Onion Diarrhea and Nausea And Vomiting    Abdominal pain   Povidone-Iodine Itching   Tamiflu  [Oseltamivir Phosphate] Nausea And Vomiting    Abdominal Pain,   Zolpidem Nausea And Vomiting and Other (See Comments)    Hallucinations   Bacitracin-Neomycin-Polymyxin  Itching and Rash   Benzalkonium Chloride Itching, Rash and Swelling   Lidocaine Hcl Itching, Rash and Swelling   Morphine Nausea And Vomiting and Rash   Neomycin-Bacitracin Zn-Polymyx Itching and Rash   Tape Itching and Rash    Adhesive tape - silicone   Tapentadol Rash   Current Meds  Medication Sig   acetaminophen (TYLENOL) 500 MG tablet Take 1,000 mg by mouth every 6 (six) hours as needed for mild pain or headache.   albuterol (VENTOLIN HFA) 108 (90 Base) MCG/ACT inhaler Inhale 2 puffs into the lungs every 6 (six) hours as needed for shortness of breath.   allopurinol (ZYLOPRIM) 100 MG tablet Take 1 tablet (100 mg total) by mouth daily. (Patient taking differently: Take 100 mg by mouth at bedtime.)   atorvastatin (LIPITOR) 10 MG tablet TAKE ONE TABLET BY MOUTH EVERYDAY AT BEDTIME   Continuous Glucose Receiver (FREESTYLE LIBRE 3 READER) DEVI 1 each every 14 (fourteen) days.   donepezil (ARICEPT) 5 MG tablet Take 1 tablet (5 mg total) by mouth at bedtime.   ELIQUIS 5 MG TABS tablet Take 5 mg by mouth 2 (two) times daily.   empagliflozin (JARDIANCE) 25 MG TABS tablet Take 1 tablet (25 mg total) by mouth daily.   furosemide (LASIX) 40 MG tablet Take 1 tablet (40 mg total) by mouth daily.   gabapentin (NEURONTIN) 800 MG tablet Take 1 tablet (800 mg total) by mouth 3 (three) times daily.   glipiZIDE (GLUCOTROL) 5 MG tablet Take 2.5 mg by mouth daily before breakfast.   levothyroxine (SYNTHROID) 88 MCG tablet TAKE 1 TABLET BY MOUTH EVERY DAY   metFORMIN (GLUCOPHAGE) 1000 MG tablet Take 1 tablet (1,000 mg total) by mouth 2 (two) times daily.   nystatin (MYCOSTATIN/NYSTOP) powder Apply 1 Application topically 3 (three) times daily. (Patient taking differently: Apply 1 Application topically 3 (three) times daily as needed (skin irritation/yeast.).)   nystatin cream (MYCOSTATIN) Apply 1 Application topically 2 (two) times daily as needed (skin irritation/rash.).   ondansetron (ZOFRAN-ODT) 4 MG  disintegrating tablet Take 1 tablet (4 mg total) by mouth every 8 (eight) hours as needed for nausea or vomiting.   OZEMPIC, 2 MG/DOSE, 8 MG/3ML SOPN Inject 2 mg into the skin every Sunday.   pioglitazone (ACTOS) 15 MG tablet Take 1 tablet (15 mg total) by mouth every morning.   promethazine (PHENERGAN) 12.5 MG tablet Take 1 tablet (12.5 mg total) by mouth every 6 (six) hours as needed for nausea or vomiting.   sertraline (ZOLOFT) 50 MG tablet Take 1 tablet (50 mg total) by mouth daily. (Patient taking differently: Take 50 mg by mouth at bedtime.)   TRELEGY ELLIPTA 200-62.5-25 MCG/ACT AEPB Inhale 1 puff into the lungs  daily.     PHYSICAL EXAMINATION:  General: Patient is well developed, well nourished, calm, collected, and in no apparent distress.  She has 5 out of 5 strength in her bilateral lower extremities.  In her right upper extremity, she has grip strength and interosseous weakness of 4 out of 5.  Reflexes show hoffman's bilaterally. Gait is wide-based. Incision c/d/i  ROS (Neurologic):  Negative except as noted above  IMAGING: MRI C spine 09/20/2023 IMPRESSION: 1. Widespread cervical spine degeneration. No acute osseous abnormality identified.   2. Bulky disc and endplate degeneration at C5-C6 with up to moderate associated spinal stenosis and spinal cord mass effect. Abnormal cord signal there most compatible with Myelomalacia, asymmetric to the right.   3. Only borderline to mild cervical spinal stenosis otherwise (C6-C7). Multilevel cervical facet arthropathy, moderate to severe on the right at C3-C4. And multilevel moderate, occasionally severe (right C4, right C6 nerve levels) bilateral cervical foraminal stenosis.     Electronically Signed   By: Odessa Fleming M.D.   On: 10/07/2023 09:17    ASSESSMENT/PLAN:  Teresa [name] is doing better s/p above low back surgery.    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  She has  ongoing clinical symptoms of myelopathy.  There is no role for conservative management and cervical myelopathy.  Have recommended surgical intervention with a C5-6 anterior cervical discectomy and fusion.  I discussed the planned procedure at length with the patient, including the risks, benefits, alternatives, and indications. The risks discussed include but are not limited to bleeding, infection, need for reoperation, spinal fluid leak, stroke, vision loss, anesthetic complication, coma, paralysis, and even death. We also discussed the possibility of post-operative dysphagia, vocal cord paralysis, and the risk of adjacent segment disease in the future. I also described in detail that improvement was not guaranteed.  The patient expressed understanding of these risks, and asked that we proceed with surgery. I described the surgery in layman's terms, and gave ample opportunity for questions, which were answered to the best of my ability.  I spent a total of 15 minutes in this patient's care today. This time was spent reviewing pertinent records including imaging studies, obtaining and confirming history, performing a directed evaluation, formulating and discussing my recommendations, and documenting the visit within the medical record.    Venetia Night MD Department of Neurosurgery

## 2023-10-09 NOTE — Patient Instructions (Signed)
 Please see below for information in regards to your upcoming surgery:   Planned surgery: C5-6 anterior cervical discectomy and fusion   Surgery date: 10/22/23 at [facility] (Medical Mall: [address], [place], [place] [address]) - you will find out your arrival time the business day before your surgery.   Pre-op appointment at [facility] Pre-admit Testing: we will call you with a date/time for this. If you are scheduled for an in person appointment, Pre-admit Testing is located on the first floor of the [facility] building, [address] [facility], [address]. Please bring all prescriptions in the original prescription bottles to your appointment. During this appointment, they will advise you which medications you can take the morning of surgery, and which medications you will need to hold for surgery. Labs (such as blood work, EKG) may be done at your pre-op appointment. You are not required to fast for these labs. Should you need to change your pre-op appointment, please call Pre-admit testing at [phone].    Blood thinners:   Eliquis:   stop Eliquis 3 days prior, resume Eliquis 14 days after     Diabetes/weight loss medications: Per anesthesia guidelines (due to the increased risk of aspiration caused by delayed gastric emptying):  Semaglutide (Ozempic): hold for 7 days prior to surgery Empagliflozin (Jardiance): hold for 3 days prior to surgery Metformin: hold for 2 days prior to surgery     Surgical clearance: we will send a clearance form to Dr Juliann Pares. They may wish to see you in their office prior to signing the clearance form. If so, they may call you to schedule an appointment.    NSAIDS (Non-steroidal anti-inflammatory drugs): because you are having a fusion, please avoid taking any NSAIDS (examples: ibuprofen, motrin, aleve, naproxen, meloxicam, diclofenac) for 3 months after surgery. Celebrex is an exception and is OK to take, if  prescribed. Tylenol is not an NSAID.    Common restrictions after surgery: No bending, lifting, or twisting ("BLT"). Avoid lifting objects heavier than 10 pounds for the first 6 weeks after surgery. Where possible, avoid household activities that involve lifting, bending, reaching, pushing, or pulling such as laundry, vacuuming, grocery shopping, and childcare. Try to arrange for help from friends and family for these activities while you heal. Do not drive while taking prescription pain medication. Weeks 6 through 12 after surgery: avoid lifting more than 25 pounds.    X-rays after surgery: Because you are having a fusion or arthroplasty: for appointments after your 2 week follow-up: please arrive at the [facility] outpatient imaging center ([address], [address], [place]) or [facility] one hour prior to your appointment for x-rays. This applies to every appointment after your 2 week follow-up. Failure to do so may result in your appointment being rescheduled.   How to contact us:  If you have any questions/concerns before or after surgery, you can reach [place] at [phone], or you can send a mychart message. We can be reached by phone or mychart 8am-4pm, Monday-Friday.  *Please note: Calls after 4pm are forwarded to a third party answering service. Mychart messages are not routinely monitored during evenings, weekends, and holidays. Please call our office to contact the answering service for urgent concerns during non-business hours.   If you have FMLA/disability paperwork, please drop it off or fax it to [phone], attention Patty.   Appointments/FMLA & disability paperwork: Joycelyn Rua, & Flonnie Hailstone Registered Nurse/Surgery scheduler: Royston Cowper Medical Assistants: Nash Mantis Physician  Assistants: Joan Flores, PA-C, Manning Charity, PA-C & Drake Leach, PA-C Surgeons: Venetia Night, MD & Ernestine Mcmurray, MD

## 2023-10-09 NOTE — H&P (View-Only) (Signed)
 REFERRING PHYSICIAN:  No referring provider defined for this encounter.  DOS: 06/18/23  L4-L5 XLIF and posterior spinal fusion   HISTORY OF PRESENT ILLNESS: Teresa [name] is status post above surgery.   She feels that her symptoms are much improved in her back.  She is currently just using Tylenol for her pain.  Unfortunately, she has continued to have problems with her balance.  She has dexterity changes.  She expresses weakness in her hands.  She also has numbness in her hands.   Family History  Problem Relation Age of Onset   Stroke Mother    Hypertension Mother    Heart disease Mother    Arthritis Mother    Heart disease Father    Hypertension Father    Diabetes Sister    Hypertension Sister    Asthma Sister    Hypertension Sister    Diverticulitis Sister    Colon cancer Maternal Grandmother    Breast cancer Maternal Grandmother    Breast cancer Maternal Aunt    Social History   Socioeconomic History   Marital status: Divorced    Spouse name: Not on file   Number of children: 2   Years of education: Not on file   Highest education level: Associate degree: occupational, Scientist, product/process development, or vocational program  Occupational History   Occupation: retired  Tobacco Use   Smoking status: Never   Smokeless tobacco: Never  Vaping Use   Vaping status: Never Used  Substance and Sexual Activity   Alcohol use: Never   Drug use: Never   Sexual activity: Not Currently  Other Topics Concern   Not on file  Social History Narrative   Lives alone   Social Drivers of Health   Financial Resource Strain: Medium Risk (08/23/2023)   Received from [facility]   Overall Financial Resource Strain (CARDIA)    Difficulty of Paying Living Expenses: Somewhat hard  Food Insecurity: No Food Insecurity (07/16/2023)   Hunger Vital Sign    Worried About Running Out of Food in the Last Year: Never true    Ran Out of Food in the Last Year: Never true  Recent Concern: Food  Insecurity - Food Insecurity Present (05/26/2023)   Received from [facility]   Hunger Vital Sign    Worried About Running Out of Food in the Last Year: Often true    Ran Out of Food in the Last Year: Often true  Transportation Needs: No Transportation Needs (07/16/2023)   PRAPARE - Administrator, Civil Service (Medical): No    Lack of Transportation (Non-Medical): No  Physical Activity: Sufficiently Active (09/19/2021)   Exercise Vital Sign    Days of Exercise per Week: 7 days    Minutes of Exercise per Session: 30 min  Stress: Stress Concern Present (09/19/2021)   [employer] of Occupational Health - Occupational Stress Questionnaire    Feeling of Stress : To some extent  Social Connections: Moderately Integrated (09/19/2021)   Social Connection and Isolation Panel [NHANES]    Frequency of Communication with Friends and Family: More than three times a week    Frequency of Social Gatherings with Friends and Family: More than three times a week    Attends Religious Services: More than 4 times per year    Active Member of [employer] or Organizations: Yes    Attends Engineer, structural: More than 4 times per year    Marital Status: Divorced  Catering manager  Violence: Not At Risk (07/16/2023)   Humiliation, Afraid, Rape, and Kick questionnaire    Fear of Current or Ex-Partner: No    Emotionally Abused: No    Physically Abused: No    Sexually Abused: No   Allergies  Allergen Reactions   Clarithromycin Nausea And Vomiting and Other (See Comments)   Codeine     Hallucination   Dilaudid  [Hydromorphone Hcl] Nausea And Vomiting   Iodine Hives and Itching   Iohexol Hives and Itching   Onion Diarrhea and Nausea And Vomiting    Abdominal pain   Povidone-Iodine Itching   Tamiflu  [Oseltamivir Phosphate] Nausea And Vomiting    Abdominal Pain,   Zolpidem Nausea And Vomiting and Other (See Comments)    Hallucinations   Bacitracin-Neomycin-Polymyxin  Itching and Rash   Benzalkonium Chloride Itching, Rash and Swelling   Lidocaine Hcl Itching, Rash and Swelling   Morphine Nausea And Vomiting and Rash   Neomycin-Bacitracin Zn-Polymyx Itching and Rash   Tape Itching and Rash    Adhesive tape - silicone   Tapentadol Rash   Current Meds  Medication Sig   acetaminophen (TYLENOL) 500 MG tablet Take 1,000 mg by mouth every 6 (six) hours as needed for mild pain or headache.   albuterol (VENTOLIN HFA) 108 (90 Base) MCG/ACT inhaler Inhale 2 puffs into the lungs every 6 (six) hours as needed for shortness of breath.   allopurinol (ZYLOPRIM) 100 MG tablet Take 1 tablet (100 mg total) by mouth daily. (Patient taking differently: Take 100 mg by mouth at bedtime.)   atorvastatin (LIPITOR) 10 MG tablet TAKE ONE TABLET BY MOUTH EVERYDAY AT BEDTIME   Continuous Glucose Receiver (FREESTYLE LIBRE 3 READER) DEVI 1 each every 14 (fourteen) days.   donepezil (ARICEPT) 5 MG tablet Take 1 tablet (5 mg total) by mouth at bedtime.   ELIQUIS 5 MG TABS tablet Take 5 mg by mouth 2 (two) times daily.   empagliflozin (JARDIANCE) 25 MG TABS tablet Take 1 tablet (25 mg total) by mouth daily.   furosemide (LASIX) 40 MG tablet Take 1 tablet (40 mg total) by mouth daily.   gabapentin (NEURONTIN) 800 MG tablet Take 1 tablet (800 mg total) by mouth 3 (three) times daily.   glipiZIDE (GLUCOTROL) 5 MG tablet Take 2.5 mg by mouth daily before breakfast.   levothyroxine (SYNTHROID) 88 MCG tablet TAKE 1 TABLET BY MOUTH EVERY DAY   metFORMIN (GLUCOPHAGE) 1000 MG tablet Take 1 tablet (1,000 mg total) by mouth 2 (two) times daily.   nystatin (MYCOSTATIN/NYSTOP) powder Apply 1 Application topically 3 (three) times daily. (Patient taking differently: Apply 1 Application topically 3 (three) times daily as needed (skin irritation/yeast.).)   nystatin cream (MYCOSTATIN) Apply 1 Application topically 2 (two) times daily as needed (skin irritation/rash.).   ondansetron (ZOFRAN-ODT) 4 MG  disintegrating tablet Take 1 tablet (4 mg total) by mouth every 8 (eight) hours as needed for nausea or vomiting.   OZEMPIC, 2 MG/DOSE, 8 MG/3ML SOPN Inject 2 mg into the skin every Sunday.   pioglitazone (ACTOS) 15 MG tablet Take 1 tablet (15 mg total) by mouth every morning.   promethazine (PHENERGAN) 12.5 MG tablet Take 1 tablet (12.5 mg total) by mouth every 6 (six) hours as needed for nausea or vomiting.   sertraline (ZOLOFT) 50 MG tablet Take 1 tablet (50 mg total) by mouth daily. (Patient taking differently: Take 50 mg by mouth at bedtime.)   TRELEGY ELLIPTA 200-62.5-25 MCG/ACT AEPB Inhale 1 puff into the lungs  daily.     PHYSICAL EXAMINATION:  General: Patient is well developed, well nourished, calm, collected, and in no apparent distress.  She has 5 out of 5 strength in her bilateral lower extremities.  In her right upper extremity, she has grip strength and interosseous weakness of 4 out of 5.  Reflexes show hoffman's bilaterally. Gait is wide-based. Incision c/d/i  ROS (Neurologic):  Negative except as noted above  IMAGING: MRI C spine 09/20/2023 IMPRESSION: 1. Widespread cervical spine degeneration. No acute osseous abnormality identified.   2. Bulky disc and endplate degeneration at C5-C6 with up to moderate associated spinal stenosis and spinal cord mass effect. Abnormal cord signal there most compatible with Myelomalacia, asymmetric to the right.   3. Only borderline to mild cervical spinal stenosis otherwise (C6-C7). Multilevel cervical facet arthropathy, moderate to severe on the right at C3-C4. And multilevel moderate, occasionally severe (right C4, right C6 nerve levels) bilateral cervical foraminal stenosis.     Electronically Signed   By: Odessa Fleming M.D.   On: 10/07/2023 09:17    ASSESSMENT/PLAN:  Teresa [name] is doing better s/p above low back surgery.    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  She has  ongoing clinical symptoms of myelopathy.  There is no role for conservative management and cervical myelopathy.  Have recommended surgical intervention with a C5-6 anterior cervical discectomy and fusion.  I discussed the planned procedure at length with the patient, including the risks, benefits, alternatives, and indications. The risks discussed include but are not limited to bleeding, infection, need for reoperation, spinal fluid leak, stroke, vision loss, anesthetic complication, coma, paralysis, and even death. We also discussed the possibility of post-operative dysphagia, vocal cord paralysis, and the risk of adjacent segment disease in the future. I also described in detail that improvement was not guaranteed.  The patient expressed understanding of these risks, and asked that we proceed with surgery. I described the surgery in layman's terms, and gave ample opportunity for questions, which were answered to the best of my ability.  I spent a total of 15 minutes in this patient's care today. This time was spent reviewing pertinent records including imaging studies, obtaining and confirming history, performing a directed evaluation, formulating and discussing my recommendations, and documenting the visit within the medical record.    Venetia Night MD Department of Neurosurgery

## 2023-10-12 ENCOUNTER — Ambulatory Visit: Payer: PPO

## 2023-10-12 ENCOUNTER — Telehealth: Payer: Self-pay | Admitting: *Deleted

## 2023-10-12 DIAGNOSIS — M205X9 Other deformities of toe(s) (acquired), unspecified foot: Secondary | ICD-10-CM

## 2023-10-12 DIAGNOSIS — E1140 Type 2 diabetes mellitus with diabetic neuropathy, unspecified: Secondary | ICD-10-CM

## 2023-10-12 DIAGNOSIS — M2142 Flat foot [pes planus] (acquired), left foot: Secondary | ICD-10-CM

## 2023-10-12 NOTE — Progress Notes (Signed)
 Patient presents to the office today for diabetic shoe and insole measuring.  Patient was measured with brannock device to determine size and width for 1 pair of extra depth shoes and foam casted for 3 pair of insoles.   Documentation of medical necessity will be sent to patient's treating diabetic doctor to verify and sign.   Patient's diabetic provider: Julieanne Manson MD [place]   Shoes and insoles will be ordered at that time and patient will be notified for an appointment for fitting when they arrive.   Shoe size (per patient): 9 Shoe choice:   [id number] / [id number] Shoe size ordered: 9WD  Ppw / Abn signed

## 2023-10-12 NOTE — Telephone Encounter (Signed)
   Pre-operative Risk Assessment    Patient Name: Teresa [name]  DOB: Aug 28, 1945 MRN: [id number]   Date of last office visit: 08/22/23 DR. LAMBERT Date of next office visit: NONE   Request for Surgical Clearance    Procedure:   C5-6 ANTERIOR CERVICAL DISCECTOMY AND FUSION  Date of Surgery:  Clearance 10/22/23                                Surgeon:  DR. [facility] Surgeon's Group or Practice Name:  [facility] Phone number: [phone]  Fax number:  [phone]   Type of Clearance Requested:   - Medical  - Pharmacy:  Hold Apixaban (Eliquis)     Type of Anesthesia:  General    Additional requests/questions:    Elpidio Anis   10/12/2023, 4:39 PM

## 2023-10-12 NOTE — Telephone Encounter (Signed)
-----   Message from Verlee Monte sent at 10/12/2023  4:16 PM EST ----- Regarding: Request for pre-operative cardiac clearance Request for pre-operative cardiac clearance:  1. What type of surgery is being performed?  C5-6 ANTERIOR CERVICAL DISCECTOMY AND FUSION  2. When is this surgery scheduled?  10/22/2023  3. Type of clearance being requested (medical, pharmacy, both)? BOTH   4. Are there any medications that need to be held prior to surgery? APIXABAN  5. Practice name and name of physician performing surgery?  Performing surgeon: Dr. Venetia Night, MD Requesting clearance: Quentin Mulling, FNP-C    6. Anesthesia type (none, local, MAC, general)? GENERAL  7. What is the office phone and fax number?   Phone: [phone] Fax: [phone]  ATTENTION: Unable to create telephone message as per your standard workflow. Directed by [facility] providers to send requests for cardiac clearance to this pool for appropriate distribution to provider covering pre-operative clearances.   Quentin Mulling, MSN, APRN, FNP-C, CEN [facility]  Peri-operative Services Nurse Practitioner Phone: [phone] 10/12/23 4:16 PM

## 2023-10-12 NOTE — Telephone Encounter (Signed)
 Pharmacy please advise on holding Eliquis prior to cervical discectomy and fusion scheduled for 10/22/2023. Thank you.

## 2023-10-15 ENCOUNTER — Encounter
Admission: RE | Admit: 2023-10-15 | Discharge: 2023-10-15 | Disposition: A | Discharge status: Home / Self Care | Source: Ambulatory Visit | Attending: Neurosurgery | Admitting: Neurosurgery

## 2023-10-15 ENCOUNTER — Telehealth: Payer: Self-pay | Admitting: *Deleted

## 2023-10-15 DIAGNOSIS — Z01818 Encounter for other preprocedural examination: Secondary | ICD-10-CM

## 2023-10-15 DIAGNOSIS — Z79899 Other long term (current) drug therapy: Secondary | ICD-10-CM

## 2023-10-15 DIAGNOSIS — I10 Essential (primary) hypertension: Secondary | ICD-10-CM

## 2023-10-15 DIAGNOSIS — Z01812 Encounter for preprocedural laboratory examination: Secondary | ICD-10-CM

## 2023-10-15 HISTORY — DX: Spinal stenosis, cervical region: M48.02

## 2023-10-15 HISTORY — DX: Disease of spinal cord, unspecified: G95.9

## 2023-10-15 HISTORY — DX: Repeated falls: R29.6

## 2023-10-15 NOTE — Telephone Encounter (Signed)
 Pt has been scheduled tele preop appt 10/17/23 ok per Tereso Newcomer, PAC to add on tomorrow at 3:20. Pt agreeable to plan of care . Med rec and consent are done.

## 2023-10-15 NOTE — Telephone Encounter (Signed)
 Patient with diagnosis of afib on Eliquis for anticoagulation.    Procedure: C5-6 ANTERIOR CERVICAL DISCECTOMY AND FUSION  Date of procedure: 10/22/23   CHA2DS2-VASc Score = 9   This indicates a 12.2% annual risk of stroke. The patient's score is based upon: CHF History: 1 HTN History: 1 Diabetes History: 1 Stroke History: 2 Vascular Disease History: 1 Age Score: 2 Gender Score: 1      CrCl 72 ml/min Platelet count 238  Given the need to hold Eliquis 3 days and pt high CHA2DS2VASc score and hx of stroke will defer to Dr. Lalla Brothers as to if this is ok.   **This guidance is not considered finalized until pre-operative APP has relayed final recommendations.**

## 2023-10-15 NOTE — Telephone Encounter (Signed)
   Name: Teresa [name]  DOB: [birth year]  MRN: [id number]  Primary Cardiologist: None  Preoperative team, please contact this patient and set up a phone call appointment for further preoperative risk assessment. Please obtain consent and complete medication review. Thank you for your help.  I confirm that guidance regarding antiplatelet and oral anticoagulation therapy has been completed and, if necessary, noted below. -Will review with Dr. Lalla Brothers to see if ok to hold Eliquis for 3 days.  I also confirmed the patient resides in the state of [place]. As per [facility] Medical Board telemedicine laws, the patient must reside in the state in which the provider is licensed.   Tereso Newcomer, PA-C 10/15/2023, 11:50 AM [facility]

## 2023-10-15 NOTE — Patient Instructions (Addendum)
 Your procedure is scheduled on:10-22-23 Monday Report to the Registration Desk on the 1st floor of the Medical Mall.Then proceed to the 2nd floor Surgery Desk To find out your arrival time, please call [phone] between 1PM - 3PM on:10-19-23 Friday If your arrival time is 6:00 am, do not arrive before that time as the Medical Mall entrance doors do not open until 6:00 am.  REMEMBER: Instructions that are not followed completely may result in serious medical risk, up to and including death; or upon the discretion of your surgeon and anesthesiologist your surgery may need to be rescheduled.  Do not eat food after midnight the night before surgery.  No gum chewing or hard candies.  You may however, drink Water up to 2 hours before you are scheduled to arrive for your surgery. Do not drink anything within 2 hours of your scheduled arrival time  One week prior to surgery (Stop NOW 10-15-23) Stop ANY OVER THE COUNTER supplements until after surgery.  You may however, continue to take Tylenol if needed for pain up until the day of surgery.  Stop OZEMPIC 7 days prior to surgery-Do NOT take again until AFTER your surgery  Stop ELIQUIS 3 days prior to surgery-Last dose will be on 10-18-23 Thursday  Stop empagliflozin (JARDIANCE) 3 days prior to surgery-Last dose will be on 10-18-23 Thursday  Stop metFORMIN (GLUCOPHAGE) 2 days prior to surgery-Last dose will be on 10-19-23 Friday  Continue taking all of your other prescription medications up until the day of surgery.  ON THE DAY OF SURGERY ONLY TAKE THESE MEDICATIONS WITH SIPS OF WATER: -gabapentin (NEURONTIN)  -levothyroxine (SYNTHROID)   Use your TRELEGY ELLIPTA and your Albuterol Inhaler at home the morning of surgery and bring your Albuterol Inhaler to the hospital  No Alcohol for 24 hours before or after surgery.  No Smoking including e-cigarettes for 24 hours before surgery.  No chewable tobacco products for at least 6 hours before  surgery.  No nicotine patches on the day of surgery.  Do not use any "recreational" drugs for at least a week (preferably 2 weeks) before your surgery.  Please be advised that the combination of cocaine and anesthesia may have negative outcomes, up to and including death. If you test positive for cocaine, your surgery will be cancelled.  On the morning of surgery brush your teeth with toothpaste and water, you may rinse your mouth with mouthwash if you wish. Do not swallow any toothpaste or mouthwash.  Use CHG Soap as directed on instruction sheet.  Do not wear jewelry, make-up, hairpins, clips or nail polish.  For welded (permanent) jewelry: bracelets, anklets, waist bands, etc.  Please have this removed prior to surgery.  If it is not removed, there is a chance that hospital personnel will need to cut it off on the day of surgery.  Do not wear lotions, powders, or perfumes.   Do not shave body hair from the neck down 48 hours before surgery.  Contact lenses, hearing aids and dentures may not be worn into surgery.  Do not bring valuables to the hospital. [facility] is not responsible for any missing/lost belongings or valuables.   Notify your doctor if there is any change in your medical condition (cold, fever, infection).  Wear comfortable clothing (specific to your surgery type) to the hospital.  After surgery, you can help prevent lung complications by doing breathing exercises.  Take deep breaths and cough every 1-2 hours. Your doctor may order a device called  an Facilities manager to help you take deep breaths. When coughing or sneezing, hold a pillow firmly against your incision with both hands. This is called "splinting." Doing this helps protect your incision. It also decreases belly discomfort.  If you are being admitted to the hospital overnight, leave your suitcase in the car. After surgery it may be brought to your room.  In case of increased patient census, it may  be necessary for you, the patient, to continue your postoperative care in the Same Day Surgery department.  If you are being discharged the day of surgery, you will not be allowed to drive home. You will need a responsible individual to drive you home and stay with you for 24 hours after surgery.   If you are taking public transportation, you will need to have a responsible individual with you.  Please call the Pre-admissions Testing Dept. at [phone] if you have any questions about these instructions.  Surgery Visitation Policy:  Patients having surgery or a procedure may have two visitors.  Children under the age of 79 must have an adult with them who is not the patient.  Temporary Visitor Restrictions Due to increasing cases of flu, RSV and COVID-19: Children ages 91 and under will not be able to visit patients in [facility] hospitals under most circumstances.  Inpatient Visitation:    Visiting hours are 7 a.m. to 8 p.m. Up to four visitors are allowed at one time in a patient room. The visitors may rotate out with other people during the day.  One visitor age 35 or older may stay with the patient overnight and must be in the room by 8 p.m.    Pre-operative 5 CHG Bath Instructions   You can play a key role in reducing the risk of infection after surgery. Your skin needs to be as free of germs as possible. You can reduce the number of germs on your skin by washing with CHG (chlorhexidine gluconate) soap before surgery. CHG is an antiseptic soap that kills germs and continues to kill germs even after washing.   DO NOT use if you have an allergy to chlorhexidine/CHG or antibacterial soaps. If your skin becomes reddened or irritated, stop using the CHG and notify one of our RNs at [phone].   Please shower with the CHG soap starting 4 days before surgery using the following schedule:     Please keep in mind the following:  DO NOT shave, including legs and underarms,  starting the day of your first shower.   You may shave your face at any point before/day of surgery.  Place clean sheets on your bed the day you start using CHG soap. Use a clean washcloth (not used since being washed) for each shower. DO NOT sleep with pets once you start using the CHG.   CHG Shower Instructions:  If you choose to wash your hair and private area, wash first with your normal shampoo/soap.  After you use shampoo/soap, rinse your hair and body thoroughly to remove shampoo/soap residue.  Turn the water OFF and apply about 3 tablespoons (45 ml) of CHG soap to a CLEAN washcloth.  Apply CHG soap ONLY FROM YOUR NECK DOWN TO YOUR TOES (washing for 3-5 minutes)  DO NOT use CHG soap on face, private areas, open wounds, or sores.  Pay special attention to the area where your surgery is being performed.  If you are having back surgery, having someone wash your back for you may be  helpful. Wait 2 minutes after CHG soap is applied, then you may rinse off the CHG soap.  Pat dry with a clean towel  Put on clean clothes/pajamas   If you choose to wear lotion, please use ONLY the CHG-compatible lotions on the back of this paper.     Additional instructions for the day of surgery: DO NOT APPLY any lotions, deodorants, cologne, or perfumes.   Put on clean/comfortable clothes.  Brush your teeth.  Ask your nurse before applying any prescription medications to the skin.      CHG Compatible Lotions   Aveeno Moisturizing lotion  Cetaphil Moisturizing Cream  Cetaphil Moisturizing Lotion  Clairol Herbal Essence Moisturizing Lotion, Dry Skin  Clairol Herbal Essence Moisturizing Lotion, Extra Dry Skin  Clairol Herbal Essence Moisturizing Lotion, Normal Skin  Curel Age Defying Therapeutic Moisturizing Lotion with Alpha Hydroxy  Curel Extreme Care Body Lotion  Curel Soothing Hands Moisturizing Hand Lotion  Curel Therapeutic Moisturizing Cream, Fragrance-Free  Curel Therapeutic Moisturizing  Lotion, Fragrance-Free  Curel Therapeutic Moisturizing Lotion, Original Formula  Eucerin Daily Replenishing Lotion  Eucerin Dry Skin Therapy Plus Alpha Hydroxy Crme  Eucerin Dry Skin Therapy Plus Alpha Hydroxy Lotion  Eucerin Original Crme  Eucerin Original Lotion  Eucerin Plus Crme Eucerin Plus Lotion  Eucerin TriLipid Replenishing Lotion  Keri Anti-Bacterial Hand Lotion  Keri Deep Conditioning Original Lotion Dry Skin Formula Softly Scented  Keri Deep Conditioning Original Lotion, Fragrance Free Sensitive Skin Formula  Keri Lotion Fast Absorbing Fragrance Free Sensitive Skin Formula  Keri Lotion Fast Absorbing Softly Scented Dry Skin Formula  Keri Original Lotion  Keri Skin Renewal Lotion Keri Silky Smooth Lotion  Keri Silky Smooth Sensitive Skin Lotion  Nivea Body Creamy Conditioning Oil  Nivea Body Extra Enriched Teacher, adult education Moisturizing Lotion Nivea Crme  Nivea Skin Firming Lotion  NutraDerm 30 Skin Lotion  NutraDerm Skin Lotion  NutraDerm Therapeutic Skin Cream  NutraDerm Therapeutic Skin Lotion  ProShield Protective Hand Cream  Provon moisturizing lotion

## 2023-10-15 NOTE — Telephone Encounter (Signed)
 Pt has been scheduled tele preop appt 10/17/23 ok per Tereso Newcomer, PAC to add on tomorrow at 3:20. Pt agreeable to plan of care . Med rec and consent are done.     Patient Consent for Virtual Visit        Teresa [name] has provided verbal consent on 10/15/2023 for a virtual visit (video or telephone).   CONSENT FOR VIRTUAL VISIT FOR:  Teresa [name]  By participating in this virtual visit I agree to the following:  I hereby voluntarily request, consent and authorize [facility] and its employed or contracted physicians, physician assistants, nurse practitioners or other licensed health care professionals (the Practitioner), to provide me with telemedicine health care services (the "Services") as deemed necessary by the treating Practitioner. I acknowledge and consent to receive the Services by the Practitioner via telemedicine. I understand that the telemedicine visit will involve communicating with the Practitioner through live audiovisual communication technology and the disclosure of certain medical information by electronic transmission. I acknowledge that I have been given the opportunity to request an in-person assessment or other available alternative prior to the telemedicine visit and am voluntarily participating in the telemedicine visit.  I understand that I have the right to withhold or withdraw my consent to the use of telemedicine in the course of my care at any time, without affecting my right to future care or treatment, and that the Practitioner or I may terminate the telemedicine visit at any time. I understand that I have the right to inspect all information obtained and/or recorded in the course of the telemedicine visit and may receive copies of available information for a reasonable fee.  I understand that some of the potential risks of receiving the Services via telemedicine include:  Delay or interruption in medical evaluation due to technological equipment failure  or disruption; Information transmitted may not be sufficient (e.g. poor resolution of images) to allow for appropriate medical decision making by the Practitioner; and/or  In rare instances, security protocols could fail, causing a breach of personal health information.  Furthermore, I acknowledge that it is my responsibility to provide information about my medical history, conditions and care that is complete and accurate to the best of my ability. I acknowledge that Practitioner's advice, recommendations, and/or decision may be based on factors not within their control, such as incomplete or inaccurate data provided by me or distortions of diagnostic images or specimens that may result from electronic transmissions. I understand that the practice of medicine is not an exact science and that Practitioner makes no warranties or guarantees regarding treatment outcomes. I acknowledge that a copy of this consent can be made available to me via my patient portal [facility] MyChart), or I can request a printed copy by calling the office of [facility].    I understand that my insurance will be billed for this visit.   I have read or had this consent read to me. I understand the contents of this consent, which adequately explains the benefits and risks of the Services being provided via telemedicine.  I have been provided ample opportunity to ask questions regarding this consent and the Services and have had my questions answered to my satisfaction. I give my informed consent for the services to be provided through the use of telemedicine in my medical care

## 2023-10-16 ENCOUNTER — Encounter: Payer: Self-pay | Admitting: Neurosurgery

## 2023-10-16 ENCOUNTER — Encounter
Admission: RE | Admit: 2023-10-16 | Discharge: 2023-10-16 | Disposition: A | Discharge status: Home / Self Care | Source: Ambulatory Visit | Attending: Neurosurgery | Admitting: Neurosurgery

## 2023-10-16 ENCOUNTER — Other Ambulatory Visit: Payer: Self-pay | Admitting: Neurosurgery

## 2023-10-16 ENCOUNTER — Ambulatory Visit: Attending: Physician Assistant | Admitting: Physician Assistant

## 2023-10-16 ENCOUNTER — Telehealth: Payer: Self-pay | Admitting: Urgent Care

## 2023-10-16 DIAGNOSIS — T502X5A Adverse effect of carbonic-anhydrase inhibitors, benzothiadiazides and other diuretics, initial encounter: Secondary | ICD-10-CM | POA: Insufficient documentation

## 2023-10-16 DIAGNOSIS — Z01812 Encounter for preprocedural laboratory examination: Secondary | ICD-10-CM | POA: Diagnosis present

## 2023-10-16 DIAGNOSIS — Z01810 Encounter for preprocedural cardiovascular examination: Secondary | ICD-10-CM | POA: Diagnosis not present

## 2023-10-16 DIAGNOSIS — E786 Lipoprotein deficiency: Secondary | ICD-10-CM | POA: Insufficient documentation

## 2023-10-16 DIAGNOSIS — Z79899 Other long term (current) drug therapy: Secondary | ICD-10-CM | POA: Diagnosis not present

## 2023-10-16 DIAGNOSIS — E876 Hypokalemia: Secondary | ICD-10-CM

## 2023-10-16 DIAGNOSIS — I10 Essential (primary) hypertension: Secondary | ICD-10-CM | POA: Insufficient documentation

## 2023-10-16 LAB — BASIC METABOLIC PANEL
Anion gap: 13 (ref 5–15)
BUN: 19 mg/dL (ref 8–23)
CO2: 26 mmol/L (ref 22–32)
Calcium: 9 mg/dL (ref 8.9–10.3)
Chloride: 99 mmol/L (ref 98–111)
Creatinine, Ser: 0.61 mg/dL (ref 0.44–1.00)
GFR, Estimated: >60 mL/min (ref >60)
Glucose, Bld: 148 mg/dL — ABNORMAL HIGH (ref 70–99)
Potassium: 3.2 mmol/L — ABNORMAL LOW (ref 3.5–5.1)
Sodium: 138 mmol/L (ref 135–145)

## 2023-10-16 LAB — SURGICAL PCR SCREEN
MRSA, PCR: NEGATIVE
Staphylococcus aureus: NEGATIVE

## 2023-10-16 MED ORDER — POTASSIUM CHLORIDE ER 10 MEQ PO TBCR: 10.0000 meq | EXTENDED_RELEASE_TABLET | Freq: Every day | ORAL | 0 refills | Status: DC

## 2023-10-16 MED ORDER — FLUCONAZOLE 150 MG PO TABS: 150.0000 mg | ORAL_TABLET | Freq: Once | ORAL | 0 refills | Status: AC

## 2023-10-16 MED ORDER — FLUCONAZOLE 150 MG PO TABS: 150.0000 mg | ORAL_TABLET | Freq: Once | ORAL | 0 refills | Status: DC

## 2023-10-16 NOTE — Progress Notes (Signed)
 [facility] Perioperative Services: Pre-Admission/Anesthesia Testing  Abnormal Lab Notification and Treatment Plan of Care   Date: 10/16/23  Name: Teresa [name] MRN:   [id number]  Re: Abnormal labs noted during PAT appointment   Notified:  Provider Name Provider Role Notification Mode  Venetia Night, MD Neurosurgery (Surgeon) Routed and/or faxed via Dondra Prader, MD Primary Care Provider Routed and/or faxed via [facility]   Clinical Information and Notes:  ABNORMAL LAB VALUE(S): Lab Results  Component Value Date   K 3.2 (L) 10/16/2023   Teresa [name] is scheduled for an elective ANTERIOR CERVICAL DECOMPRESSION/DISCECTOMY FUSION 1 LEVEL on 10/22/2023. In review of her medication reconciliation, it is noted that the patient is taking prescribed diuretic medications (furosemide 40 mg) daily.   Please note, in efforts to promote a safe and effective anesthetic course, per current guidelines/standards set by the [facility] anesthesia team, the minimal acceptable K+ level for the patient to proceed with general anesthesia is 3.0 mmol/L. With that being said, if the patient drops any lower, her elective procedure will need to be postponed until K+ is better optimized. In efforts to prevent case cancellation, and ultimately to promote the safety of this patient undergoing sedation/anesthesia, will make efforts to optimize pre-surgical K+ level allowing the surgical intervention to proceed as planned.    Impression and Plan:  Teresa [name] found to be HYPOkalemic at 3.2 mmol/L on preoperative labs.   She is on daily loop diuretic therapy. Patient does not take a daily K+ supplement. Discussed diuretic therapy as likely etiology in the absence of GI related symptoms (no diarrhea). Patient denies regular use of laxative medications. Reviewed other potential causes, including decreased intake of dietary K+ and other potentials for insensible losses. Reviewed plans for  preoperative optimization as follows:   Meds ordered this encounter  Medications   potassium chloride (KLOR-CON) 10 MEQ tablet    Sig: Take 1 tablet (10 mEq total) by mouth daily. Be sure to take dose on day of surgery. Follow up with PCP for repeat labs.    Dispense:  7 tablet    Refill:  0    Please contact the patient as soon as it is available for pickup. Rx is for preoperative K+ optimization and needs to be started ASAP.   Encouraged patient to follow up with PCP about 2-3 weeks postoperatively to have labs rechecked to ensure that levels are remaining within normal range. Discussed nutritional intake of K+ rich foods as an adjunctive way to keep her K+ levels normal; list of K+ rich foods provided. Also mentioned ORS, however advised her not to rely solely on these drinks, as they are high in Na+, and she has a HTN diagnosis.   Will send copy of this note to surgeon and PCP to make them aware of K+ level and plans for correction. Patient was treated for a similar level of hypokalemia prior to surgery back in 06/2023. Discussed that PCP may elect to pursue a change in diuretic therapy to a K+ sparing type medication, or alternatively, they may consider adding a daily K+ supplement if levels remain low on recheck. Order entered to recheck K+ on the day of her surgery to ensure optimization. Wished patient the best of luck with her upcoming surgery and subsequent recovery. She was encouraged to return call to the PAT clinic, or to her surgeon's office, should any questions or concerns arise between now and the time of her surgery. Patient was  appreciative of the care/concern expressed by PAT staff.   Encounter Diagnoses  Name Primary?   Pre-operative laboratory examination Yes   Diuretic-induced hypokalemia    Long term current use of diuretic    Teresa Mulling, MSN, APRN, FNP-C, CEN [facility]  Perioperative Services Nurse Practitioner Phone: [phone] 10/16/23 1:07  PM  NOTE: This note has been prepared using Dragon dictation software. Despite my best ability to proofread, there is always the potential that unintentional transcriptional errors may still occur from this process.

## 2023-10-16 NOTE — Telephone Encounter (Signed)
 Notes faxed to surgeon.   Tereso Newcomer, PA-C  10/16/2023 4:07 PM

## 2023-10-16 NOTE — Progress Notes (Signed)
 Virtual Visit via Telephone Note   Because of Teresa [name] co-morbid illnesses, she is at least at moderate risk for complications without adequate follow up.  This format is felt to be most appropriate for this patient at this time.  Due to technical limitations with video connection (technology), today's appointment will be conducted as an audio only telehealth visit, and Teresa [name] verbally agreed to proceed in this manner.   All issues noted in this document were discussed and addressed.  No physical exam could be performed with this format.  Evaluation Performed:  Preoperative cardiovascular risk assessment _____________   Date:  10/16/2023   Patient ID:  Teresa [name], DOB 1945-11-22, MRN [id number] Patient Location:  Home Provider location:   Office  Primary Care Provider:  Bosie Clos, MD Primary Cardiologist:  Dr. Ole[facility] EP: Dr. Lalla Brothers, [facility]  Chief Complaint / Patient Profile   78 y.o. y/o female with a h/o  Paroxysmal atrial fibrillation (HFpEF) heart failure with preserved ejection fraction TTE 09/05/2023: EF 60-65, no RWMA mild MR, AV sclerosis Coronary artery disease CCTA 11/01/2021: Ostial LAD 25-49, mid LAD 50-69; mid LCx and proximal OM1 25-49; ostial RCA 25-49; CAC score 174 (67th percentile; FFR normal Hx of CVA, TIA Hypertension Hyperlipidemia Diabetes mellitus COPD Seizure disorder OSA  who is pending C5-6 discectomy and fusion on 10/22/2023 with Dr. Myer Haff and presents today for telephonic preoperative cardiovascular risk assessment.  Request is to hold Eliquis.  The patient's history was reviewed by our Pharm.D.  Given the need to hold Eliquis for 3 days and patient's prior history of stroke, the patient's history was reviewed with Dr. Lalla Brothers.  Dr. Lalla Brothers agreed that the patient could hold Eliquis for 3 days prior to her procedure.  History of Present Illness    Teresa [name] is a 78 y.o. female who  presents via audio/video conferencing for a telehealth visit today.  Pt was last seen in cardiology clinic on 08/22/23 by Dr. Lalla Brothers.  At that time Teresa [name] had symptoms of shortness of breath. A follow up echocardiogram was obtained and demonstrated normal EF.  The patient is now pending procedure as outlined above. Since her last visit, she has remained stable.  She has a chronic history of exertional chest discomfort and shortness of breath.  There are times where she can exert herself without symptoms.  These have been ongoing for over a year without any significant change.  She is able to remain active and can do activities such as going up steps, walk a block or back in the room.  She has not had orthopnea, leg edema or weight gain.  Physical Exam    Vital Signs:  Teresa [name] does not have vital signs available for review today.   Given telephonic nature of communication, physical exam is limited. AAOx3. NAD. Normal affect.  Speech and respirations are unlabored.  Accessory Clinical Findings     CHA2DS2-VASc Score = 9   This indicates a 12.2% annual risk of stroke. The patient's score is based upon: CHF History: 1 HTN History: 1 Diabetes History: 1 Stroke History: 2 Vascular Disease History: 1 Age Score: 2 Gender Score: 1      Assessment & Plan    Assessment & Plan Preoperative cardiovascular examination Teresa [name]'s perioperative risk of a major cardiac event is 6.6% according to the Revised Cardiac Risk Index (RCRI).  Therefore, she is at high risk for perioperative complications.  Her functional capacity is fair at 4.74 METs according to the Duke Activity Status Index (DASI). Recommendations: According to ACC/AHA guidelines, no further cardiovascular testing needed.  The patient may proceed to surgery at acceptable risk.   Antiplatelet and/or Anticoagulation Recommendations: Eliquis (Apixaban) can be held for 3 days prior to surgery.  Please resume post op when  felt to be safe.  Ideally, Eliquis should be resumed within 24 hours of the procedure. If there is a significant concern for bleeding, the Eliquis should not be held longer than 7 days post op.   A copy of this note will be routed to requesting surgeon.  Time:   Today, I have spent 8 minutes with the patient with telehealth technology discussing medical history, symptoms, and management plan.     Tereso Newcomer, PA-C 10/16/2023, 4:06 PM

## 2023-10-18 ENCOUNTER — Encounter: Payer: Self-pay | Admitting: Neurosurgery

## 2023-10-18 NOTE — Progress Notes (Signed)
 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 Clinical nursing staff has updated patient's PMH/PSHx, current medication list, and drug allergies/intolerances to ensure comprehensive history available to assist in medical decision making as it pertains to the aforementioned surgical procedure and anticipated anesthetic course. Extensive review of available clinical information personally performed. Teresa [name] PMH and PSHx updated with any diagnoses/procedures that  may have been inadvertently omitted during her intake with the pre-admission testing department's nursing staff.  Clinical Discussion:  Teresa [name] is a 78 y.o. female who is submitted for pre-surgical anesthesia review and clearance prior to her undergoing the above procedure. Pertinent PMH includes: CAD, atrial fibrillation, HFpEF, PSVT, Mobitz type I AV block, palpitations, RBBB, aortic atherosclerosis, cerebral microvascular ischemic disease, mild CVA,  pulmonary hypertension, angina, HTN, HLD, T2DM, hypothyroidism, COPD, asthma, DOE, OSAH (does not require nocturnal PAP therapy), GERD (no daily Tx), hiatal hernia  (s/p Nissen fundoplication), peripheral edema, scleroderma, OA, chronic lower back pain, cervical stenosis, depression, anxiety, memory loss.   Patient is followed by cardiology Juliann Pares, MD) and electrophysiology Lalla Brothers, MD). She was last seen in the electrophysiology office on 08/22/2023; notes reviewed. At the time of her clinic visit, patient reporting what sounds to be medication mediated GI symptoms. She also reported SOB and overall decreased exercise tolerance. Patient followed jointly by pulmonary medicine Karna Christmas, MD) for post SARS-CoV-2 ILD, which notes indicated has improved overall. Patient denied any PND, orthopnea, significant peripheral edema, weakness, fatigue, vertiginous symptoms, or presyncope/syncope. Patient with a past medical history significant for cardiovascular diagnoses. Documented physical exam was grossly benign, providing no evidence of acute exacerbation and/or decompensation of the patient's known cardiovascular conditions.  Patient reportedly presented to the ED with complaints of transient aphasia, memory loss, and extremity numbness.  CT and MRI imaging of the brain revealed chronic cerebral microvascular ischemic disease, however no acute intracranial process.  Etiology of event was felt to be secondary to her atrial fibrillation.  Patient advising that event was diagnosed as a mild CVA.  Aside from the memory loss, patient has no residual deficits following neurological event.   Coronary CTA was performed on 11/01/2021 revealing a coronary calcium score of 174, which was the 67 percentile for age, sex, and race matched controls.  Study revealed mild stenosis in the ostial LAD, mid LCx, proximal OM1, and ostial RCA.  Moderate soft plaque stenosis noted in the mid LAD causing up to 50-69% stenosis.  FFR analysis performed demonstrating no hemodynamically significant flow-limiting lesions.   Left main: 0.99 LAD: proximal = 0.98, mid 0.92, distal 0.84 LCx: proximal =  0.99, mid = 0.99, distal = 0.96 RCA: proximal =  0.95 mid = 0.92, distal = 0.90   Long-term cardiac event monitor study performed on 01/27/2022 revealing a predominant underlying sinus rhythm with an average rate of 66 bpm; range 32-121 bpm.  There were 6 nonsustained SVT episodes with the longest lasting 17 beats an average rate of 109 bpm. Wenkebach present. Occasional supraventricular ectopy (2.9%) and rare ventricular ectopy noted.  There were no sustained arrhythmias.  No evidence of atrial fibrillation.  Patient with an atrial fibrillation diagnosis; CHA2DS2-VASc Score = 9 (age x 2, sex, CHF, CVA x2, HTN, vascular disease history, T2DM). Her rate and rhythm are currently being maintained without the use of pharmacological intervention.  Patient is not on beta-blocker therapy due to previous issues with significant bradycardia.  She is chronically anticoagulated on standard dose apixaban; compliant with therapy with no evidence or reports of GI bleeding.  Blood pressure well controlled at 104/60 mmHg on currently prescribed diuretic monotherapy.  Patient is on atorvastatin for her HLD diagnosis and further ASCVD prevention. T2DM is reasonably controlled on currently prescribed regimen; last HgbA1c was 7.5% when checked on 06/18/2023.  In the setting of known cardiovascular diagnoses and concurrent T2DM, patient is on an SGLT2i (empagliflozin) for added cardiovascular and renovascular protection. Patient has an OSAH diagnosis, however she is noncompliant with prescribed nocturnal PAP therapy. Repeat PSG recommended. Functional capacity limited by age, deconditioning, and multiple medical comorbidities. She is able to complete all of her ADL/IADLs without cardiovascular limitation.  Patient questionably able to achieve 4 METS of physical activity without experiencing, at least to some degree, significant angina/anginal equivalent symptoms. No changes were made to her medication regimen. To further evaluate  her reported dyspnea, patient sent for a repeat echocardiogram. Patient to follow-up with outpatient cardiology in 6 months or sooner if needed.   Since patient was last seen by cardiology she has undergone the recommended repeat non-invasive cardiovascular testing.   Most recent TTE performed on 09/05/2023 revealed a normal left ventricular systolic function with an EF of 60-65%. There were no regional wall motion abnormalities. Left ventricular diastolic Doppler parameters consistent with abnormal relaxation (G1DD). GLS -22.7%. Right ventricular size and function normal with a TAPSE measuring 1.6 cm  (normal range >/= 1.6 cm. There was mild mitral valve regurgitation. All transvalvular gradients were noted to be normal providing no evidence suggestive of valvular stenosis. Aorta normal in size with no evidence of ectasia or aneurysmal dilatation.  Teresa [name] is scheduled for a ANTERIOR CERVICAL DECOMPRESSION/DISCECTOMY FUSION 1 LEVEL on 10/22/2022 with Dr. Venetia Night, MD.  Given patient's past medical history significant for cardiovascular and cardiopulmonary diagnoses, presurgical clearances were sought from patient's specialty providers.  Clearances were obtained as follows.    Per pulmonary medicine, "ARISCAT (Canet) Preoperative pulmonary risk index in adults- low risk: 1.6% pulmonary postoperative complication rate. Arozullah respiratory failure index- low risk: 1.8% pulmonary postoperative complication rate. Chales Abrahams calculator for postoperative respiratory failure- low risk: 1.07% probability of postoperative respiratory failure. Recommendation for incentive spirometry and CPAP postoperatively".  Per cardiology, "Ms. Briley's perioperative risk of a major cardiac event is 6.6% according to the Revised Cardiac Risk Index (RCRI).  Therefore, she is at  risk for perioperative complications.   Her functional capacity is fair at 4.74 METs according to the Duke Activity Status Index (DASI).  According to ACC/AHA guidelines, no further cardiovascular testing needed.  The patient may proceed to surgery at ACCEPTABLE risk".  Again, this patient is on daily oral anticoagulation therapy using a DOAC medication.  She  has been instructed on recommendations for holding her apixaban for 3 days prior to her procedure with plans to restart as soon as postoperative bleeding risk felt to be minimized by her attending surgeon. The patient has been instructed that her last dose of her apixaban should be on 10/18/2023.  Patient reports previous perioperative complications with anesthesia in the past. Patient has a PMH (+) for PONV. Symptoms and history of PONV will be discussed with patient by anesthesia team on the day of her procedure. Interventions will be ordered as deemed necessary based on patient's individual care needs as determined by anesthesiologist. Additionally, patient reporting (+) delayed emergence from general anesthesia when she was younger. In review of the available records, it is noted that patient underwent a general anesthetic course here at [facility] ([facility] in 06/2023 without documented complications.      10/09/2023    3:16 PM 09/13/2023    1:18 PM 08/22/2023    2:05 PM  Vitals with BMI  Height 5\' 2"  5\' 2"  5\' 2"   Weight 176 lbs 176 lbs 176 lbs 8 oz  BMI 32.18 32.18 32.27  Systolic 126 112   Diastolic 76 74     Providers/Specialists:   NOTE: Primary physician provider listed below. Patient may have been seen by APP or partner within same practice.   PROVIDER ROLE / SPECIALTY LAST Donalynn Furlong, MD Neurosurgery (Surgeon) 10/09/2023  Bosie Clos, MD Primary Care Provider 09/26/2023  Rudean Hitt, MD Cardiology 05/07/2023  Vida Rigger, MD Pulmonary Medicine 08/23/2023  Steffanie Dunn, MD Electrophysiology 08/22/2023; update call with APP 10/16/2023   Allergies:  Clarithromycin, Codeine, Dilaudid   [hydromorphone hcl], Iodine, Iohexol, Onion, Povidone-iodine, Tamiflu  [oseltamivir phosphate], Zolpidem, Bacitracin-neomycin-polymyxin, Benzalkonium chloride, Lidocaine hcl, Morphine, Neomycin-bacitracin zn-polymyx, Tape, and Tapentadol  Current Home Medications:   No current facility-administered medications for this encounter.    acetaminophen (TYLENOL) 500 MG tablet   albuterol (VENTOLIN HFA) 108 (90 Base) MCG/ACT inhaler   allopurinol (ZYLOPRIM) 100 MG tablet   atorvastatin (LIPITOR) 10 MG tablet   ELIQUIS 5 MG TABS tablet   empagliflozin (JARDIANCE) 25 MG TABS tablet   furosemide (LASIX) 40 MG tablet   gabapentin (NEURONTIN) 800 MG tablet   glipiZIDE (GLUCOTROL) 5 MG tablet   levothyroxine (SYNTHROID) 88 MCG tablet   metFORMIN (GLUCOPHAGE) 1000 MG tablet   nystatin (MYCOSTATIN/NYSTOP) powder   nystatin cream (MYCOSTATIN)   ondansetron (ZOFRAN-ODT) 4 MG disintegrating tablet   OZEMPIC, 2 MG/DOSE, 8 MG/3ML SOPN   pioglitazone (ACTOS) 15 MG tablet   promethazine (PHENERGAN) 12.5 MG tablet   TRELEGY ELLIPTA 200-62.5-25 MCG/ACT AEPB   Continuous Glucose Receiver (FREESTYLE LIBRE 3 READER) DEVI   potassium chloride (KLOR-CON) 10 MEQ tablet   History:   Past Medical History:  Diagnosis Date   (HFpEF) heart failure with preserved ejection fraction (HCC)    a.) TTE 09/22/2014: EF >55%, no RWMAs, mild MAC, normal RVSF, mild TR/PR, mod MR; b.) TTE 08/04/2019: EF >55%, no RWMAs, normal RVSF, triv MR/TR; c.) TTE 06/14/2021: EF 60-65%, no RMWAs, normal RVSF, triv MR   Anginal pain (HCC)    Anxiety    Aortic atherosclerosis (HCC)    Arthritis    Asthma    Beta-blockers contraindicated    a.) significant bradycardia in the past while on therapy   Bilateral lower extremity edema    Cerebral microvascular disease    Cervical myelopathy (HCC)    Cervical spinal stenosis    Chronic  bilateral low back pain with bilateral sciatica    a.) s.p L4-L5 lateral lumbar interbody fusion  06/18/2023   Complication of anesthesia    a.) PONV; b.) delayed emergence when she was younger   Concussion 08/17/2023   COPD (chronic obstructive pulmonary disease) (HCC)    Coronary artery disease    a.) cCTA 11/01/2021: Ca+ score 174 (67th percentile for age/sex match) --> FFR demonstrated no hemodynamically significant stenosis   Depression    Diverticulosis    DOE (dyspnea on exertion)    Dyspnea    GERD (gastroesophageal reflux disease)    a.) no daily Tx; s/p Nissen fundoplication   Gout    Heart murmur    History of 2019 novel coronavirus disease (COVID-19) 05/12/2019   History of hiatal hernia    a.) s/p Nissen fundoplication   History of kidney stones    History of orthopnea    HLD (hyperlipidemia)    Hypertension    Hypothyroidism    Lumbar stenosis 2024   Memory loss    a.) s/p "mini stroke" in 2021; b.) on acetylcholinesterase inhibitor (donepazil)   Migraines    Mobitz type I Wenckebach atrioventricular block    a.) holter study 01/27/2022   On apixaban therapy    PAF (paroxysmal atrial fibrillation) (HCC)    a.) CHA2DS2-VASc = 61 (age x2, sex, HFpEF, CVA x2, HTN, vascular disease history, T2DM) as of 06/14/2023;  b.) rate/rhythm maintained without pharmacological intervention; chronically anticoagulated using standard dose apixaban   Palpitations    Pneumonia due to COVID-19 virus 05/2019   PONV (postoperative nausea and vomiting)    PSVT (paroxysmal supraventricular tachycardia) (HCC) 01/27/2022   a.) holter study 01/27/2022 - longest lasting 17 beats at a rate of 109 bpm   Pulmonary HTN (HCC)    RBBB (right bundle branch block)    Recurrent falls    Scleroderma (HCC)    Sleep apnea    a.) does not require nocturnal PAP therapy   Stroke (HCC) 2021   a.) "mild" per patient; no findings on imaging; presented with transient aphasia, memory loss, extremity numbness   T2DM (type 2 diabetes mellitus) (HCC)    Transfusion of blood product refused for religious  reason (Jehovah's witness)    Vertigo    Vitamin D deficiency    Past Surgical History:  Procedure Laterality Date   ABDOMINAL HYSTERECTOMY     ovaries intact   ANKLE ARTHROSCOPY Left 03/03/2022   Procedure: ANKLE ARTHROSCOPY;  Surgeon: Edwin Cap, DPM;  Location: [facility] ORS;  Service: Podiatry;  Laterality: Left;  GENERAL WITH POP AND SAPHENOUS BLOCK   ANTERIOR LATERAL LUMBAR FUSION WITH PERCUTANEOUS SCREW 1 LEVEL N/A 06/18/2023   Procedure: L4-5 LATERAL LUMBAR INTERBODY FUSION AND POSTERIOR SPINAL FUSION;  Surgeon: Venetia Night, MD;  Location: [facility] ORS;  Service: Neurosurgery;  Laterality: N/A;   APPLICATION OF INTRAOPERATIVE CT SCAN N/A 06/18/2023   Procedure: APPLICATION OF INTRAOPERATIVE CT SCAN;  Surgeon: Venetia Night, MD;  Location: [facility] ORS;  Service: Neurosurgery;  Laterality: N/A;   BLADDER SURGERY     bladder tuck   BREAST BIOPSY Right 03/20/2016   neg x 2 area   CARDIAC CATHETERIZATION     CARPAL TUNNEL RELEASE Bilateral    CATARACT EXTRACTION W/PHACO Left 09/17/2018   Procedure: CATARACT EXTRACTION PHACO AND INTRAOCULAR LENS PLACEMENT (IOC) LEFT, DIABETIC;  Surgeon: Galen Manila, MD;  Location: [facility] ORS;  Service: Ophthalmology;  Laterality: Left;  [place] 00:34 CDE 4.85 Fluid pack lot #  [id number]   CATARACT EXTRACTION W/PHACO Right 10/15/2018   Procedure: CATARACT EXTRACTION PHACO AND INTRAOCULAR LENS PLACEMENT (IOC)-RIGHT;  Surgeon: Galen Manila, MD;  Location: [facility] ORS;  Service: Ophthalmology;  Laterality: Right;  [place] 00:27.6 CDE 3.43 Fluid Pack Lot # [id number]   COLONOSCOPY WITH PROPOFOL N/A 09/27/2018   Procedure: COLONOSCOPY WITH PROPOFOL;  Surgeon: Pasty Spillers, MD;  Location: [facility];  Service: Endoscopy;  Laterality: N/A;   DILATION AND CURETTAGE OF UTERUS     EYE SURGERY     eyelid   FRACTURE SURGERY     left ankle-plate and screws palced   GRAFT APPLICATION Left 03/03/2022   Procedure: BONE GRAFT;  Surgeon: Edwin Cap, DPM;  Location: [facility] ORS;  Service: Podiatry;  Laterality: Left;   HARDWARE REMOVAL Left 03/03/2022   Procedure: POSSIBLE HARDWARE REMOVAL;  Surgeon: Edwin Cap, DPM;  Location: [facility] ORS;  Service: Podiatry;  Laterality: Left;   LITHOTRIPSY     NISSEN FUNDOPLICATION     SHOULDER SURGERY Right    rotator cuff   TONSILLECTOMY     Family History  Problem Relation Age of Onset   Stroke Mother    Hypertension Mother    Heart disease Mother    Arthritis Mother    Heart disease Father    Hypertension Father    Diabetes Sister    Hypertension Sister    Asthma Sister    Hypertension Sister    Diverticulitis Sister    Colon cancer Maternal Grandmother    Breast cancer Maternal Grandmother    Breast cancer Maternal Aunt    Social History   Tobacco Use   Smoking status: Never   Smokeless tobacco: Never  Vaping Use   Vaping status: Never Used  Substance Use Topics   Alcohol use: Never   Drug use: Never    Pertinent Clinical Results:  LABS:  Lab Results  Component Value Date   WBC 9.4 08/17/2023   HGB 14.2 08/17/2023   HCT 44.1 08/17/2023   MCV 93.8 08/17/2023   PLT 238 08/17/2023   Lab Results  Component Value Date   NA 138 10/16/2023   CL 99 10/16/2023   K 3.2 (L) 10/16/2023   CO2 26 10/16/2023   BUN 19 10/16/2023   CREATININE 0.61 10/16/2023   GFRNONAA >60 10/16/2023   CALCIUM 9.0 10/16/2023   PHOS 2.8 07/17/2023   ALBUMIN 3.9 07/15/2023   GLUCOSE 148 (H) 10/16/2023    MRSA, PCR 10/16/2023 NEGATIVE  NEGATIVE Final   Staphylococcus aureus 10/16/2023 NEGATIVE  NEGATIVE Final   Comment: (NOTE) The Xpert SA Assay (FDA approved for NASAL specimens in patients 46 years of age and older), is one component of a comprehensive surveillance program. It is not intended to diagnose infection nor to guide or monitor treatment. Performed at [facility], [address] [place], [place] [address]     ECG: Date: 08/17/2023 Time ECG obtained: 1527  AM Rate: 61 bpm Rhythm:  Normal sinus rhythm; RBBB Axis (leads I and aVF): Normal Intervals: PR 252 ms. QRS 130. QTc 447 ms. ST segment and T wave changes: Inferior and anterolateral ST/T wave changes (III, aVF, V2-V6) Comparison: Similar to previous tracing obtained on 06/06/2023   IMAGING / PROCEDURES: MR CERVICAL SPINE WO CONTRAST performed on 09/20/2023 Widespread cervical spine degeneration. No acute osseous abnormality identified. Bulky disc and endplate degeneration at C5-C6 with up to moderate associated spinal stenosis and spinal cord mass effect. Abnormal cord signal there most compatible  with Myelomalacia, asymmetric to the right. Only borderline to mild cervical spinal stenosis otherwise (C6-C7). Multilevel cervical facet arthropathy, moderate to severe on the right at C3-C4, and multilevel moderate, occasionally severe (right C4, right C6 nerve levels) bilateral cervical foraminal stenosis.  DG LUMBAR SPINE 2-3 VIEWS performed on 09/13/2023 Stable compared to 08/02/2023.  L4-S1 fusion without detected complication.   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 No frank honeycombing. Findings are suggestive of an alternative diagnosis (not UIP) per consensus guidelines: Diagnosis of Idiopathic Pulmonary Fibrosis: An Official ATS/ERS/JRS/ALAT Clinical Practice Guideline. Am Rosezetta Schlatter Crit Care Med Vol 198, Iss 5, [id number], Apr 07 2017 Moderate patchy air trapping in both lungs, indicative of small airways disease, unchanged. Three-vessel coronary atherosclerosis. Stable left adrenal adenoma, for which no follow-up imaging is recommended. Aortic atherosclerosis  TRANSTHORACIC ECHOCARDIOGRAM performed on 09/05/2023 Left ventricular ejection fraction, by estimation, is 60 to 65%. The left ventricle has normal function. The left ventricle has no regional wall motion  abnormalities. Left ventricular diastolic parameters are consistent with Grade I diastolic dysfunction (impaired relaxation). The average left ventricular global longitudinal strain is -22.7 %. The global longitudinal strain is normal.  Right ventricular systolic function is normal. The right ventricular size is normal. Tricuspid regurgitation signal is inadequate for assessing PA pressure.  The mitral valve is normal in structure. Mild mitral valve regurgitation. No evidence of mitral stenosis.  The aortic valve is normal in structure. Aortic valve regurgitation is not visualized. Aortic valve sclerosis is present, with no evidence of aortic valve stenosis.  The inferior vena cava is normal in size with greater than 50% respiratory variability, suggesting right atrial pressure of 3 mmHg.   LONG TERM CARDIAC EVENT MONITOR STUDY performed on 01/27/2022 HR 32 - 121 bpm, average 66 bpm. 6 nonsustained SVT, longest 17 beats at an average rate of 109 bpm. Wenckebach present. Occasional supraventricular ectopy, 2.9% Rare ventricular ectopy. No sustained arrhythmias. No atrial fibrillation. Symptom triggered events correspond to sinus rhythm +/- PVC.   CT CORONARY MORPH W/CTA COR W/SCORE W/CA W/CM &/OR WO/CM; FRACTIONAL FLOW RESERVE DATA performed on 11/01/2021 Coronary calcium score of 174. This was 67th percentile for age-sex, and race-matched controls Normal coronary origin with right dominance Mild to moderate atherosclerosis.  CAD RADS 2 Recommend preventive therapy and risk factor modification The study is limited by significant noise artifact FFR flow analysis demonstrates no hemodynamically flow limiting lesions. Left Main: No significant stenosis. LM FFR = 0.99. LAD: No significant stenosis. Proximal FFR = 0.98, Mid FFR = 0.92, Distal FFR =0.84. LCX: No significant stenosis. Proximal FFR = 0.99, Mid FFR = 0.99, Distal FFR = 0.96. RCA: No significant stenosis. Proximal FFR = 0.0.95, Mid  FFR = 0.92, Distal FFR = 0.90.   Impression and Plan:  Teresa [name] has been referred for pre-anesthesia review and clearance prior to her undergoing the planned anesthetic and procedural courses. Available labs, pertinent testing, and imaging results were personally reviewed by me in preparation for upcoming operative/procedural course. [facility] medical record has been updated following extensive record review and patient interview with PAT staff.   This patient has been appropriately cleared by cardiology (ACCEPTABLE ) and by pulmonary medicine (LOW/ACCEPTABLE) with individually indicated risk of patient experiencing significant cardiovascular/cardiopulmonary complications during her perioperative course. Based on clinical review performed today (10/18/23), barring any significant acute changes in the patient's overall condition, it is anticipated  that she will be able to proceed with the planned surgical intervention. Any acute changes in clinical condition may necessitate her procedure being postponed and/or cancelled. Patient will meet with anesthesia team (MD and/or CRNA) on the day of her procedure for preoperative evaluation/assessment. Questions regarding anesthetic course will be fielded at that time.   Pre-surgical instructions were reviewed with the patient during her PAT appointment, and questions were fielded to satisfaction by PAT clinical staff. She has been instructed on which medications that she will need to hold prior to surgery, as well as the ones that have been deemed safe/appropriate to take on the day of her procedure. As part of the general education provided by PAT, patient made aware both verbally and in writing, that she would need to abstain from the use of any illegal substances during her perioperative course.  She was advised that failure to follow the provided instructions could necessitate case cancellation or result in serious perioperative complications up to and  including death. Patient encouraged to contact PAT and/or her surgeon's office to discuss any questions or concerns that may arise prior to surgery; verbalized understanding.   Quentin Mulling, MSN, APRN, FNP-C, CEN [facility]  Perioperative Services Nurse Practitioner Phone: [phone] Fax: [phone] 10/18/23 2:27 PM  NOTE: This note has been prepared using Dragon dictation software. Despite my best ability to proofread, there is always the potential that unintentional transcriptional errors may still occur from this process.

## 2023-10-21 MED ORDER — CEFAZOLIN IN SODIUM CHLORIDE 2-0.9 GM/100ML-% IV SOLN
2.0000 g | Freq: Once | INTRAVENOUS | Status: DC
  Filled 2023-10-21: qty 100

## 2023-10-21 MED ORDER — ORAL CARE MOUTH RINSE: 15.0000 mL | Freq: Once | OROMUCOSAL | Status: AC

## 2023-10-21 MED ORDER — CHLORHEXIDINE GLUCONATE 0.12 % MT SOLN
15.0000 mL | Freq: Once | OROMUCOSAL | Status: AC
  Administered 2023-10-22: 15 mL via OROMUCOSAL

## 2023-10-21 MED ORDER — CEFAZOLIN SODIUM-DEXTROSE 2-4 GM/100ML-% IV SOLN
2.0000 g | INTRAVENOUS | Status: AC
  Administered 2023-10-22: 2 g via INTRAVENOUS

## 2023-10-21 MED ORDER — SODIUM CHLORIDE 0.9 % IV SOLN: INTRAVENOUS | Status: DC

## 2023-10-22 ENCOUNTER — Other Ambulatory Visit: Payer: Self-pay

## 2023-10-22 ENCOUNTER — Encounter: Payer: Self-pay | Admitting: Neurosurgery

## 2023-10-22 ENCOUNTER — Ambulatory Visit: Payer: Self-pay | Admitting: Urgent Care

## 2023-10-22 ENCOUNTER — Ambulatory Visit

## 2023-10-22 ENCOUNTER — Inpatient Hospital Stay
Admission: RE | Admit: 2023-10-22 | Discharge: 2023-10-28 | DRG: 471 | Disposition: A | Discharge status: Home Health | Attending: Internal Medicine | Admitting: Internal Medicine

## 2023-10-22 ENCOUNTER — Encounter
Admission: RE | Disposition: A | Discharge status: Home Health | Payer: Self-pay | Source: Home / Self Care | Attending: Neurosurgery

## 2023-10-22 DIAGNOSIS — Z794 Long term (current) use of insulin: Secondary | ICD-10-CM

## 2023-10-22 DIAGNOSIS — Z8673 Personal history of transient ischemic attack (TIA), and cerebral infarction without residual deficits: Secondary | ICD-10-CM

## 2023-10-22 DIAGNOSIS — E1140 Type 2 diabetes mellitus with diabetic neuropathy, unspecified: Principal | ICD-10-CM | POA: Diagnosis present

## 2023-10-22 DIAGNOSIS — Z8249 Family history of ischemic heart disease and other diseases of the circulatory system: Secondary | ICD-10-CM

## 2023-10-22 DIAGNOSIS — M5441 Lumbago with sciatica, right side: Secondary | ICD-10-CM | POA: Diagnosis present

## 2023-10-22 DIAGNOSIS — J189 Pneumonia, unspecified organism: Secondary | ICD-10-CM | POA: Diagnosis not present

## 2023-10-22 DIAGNOSIS — E876 Hypokalemia: Secondary | ICD-10-CM | POA: Diagnosis present

## 2023-10-22 DIAGNOSIS — K3184 Gastroparesis: Secondary | ICD-10-CM | POA: Diagnosis present

## 2023-10-22 DIAGNOSIS — Z91041 Radiographic dye allergy status: Secondary | ICD-10-CM

## 2023-10-22 DIAGNOSIS — I5032 Chronic diastolic (congestive) heart failure: Secondary | ICD-10-CM | POA: Diagnosis present

## 2023-10-22 DIAGNOSIS — G8929 Other chronic pain: Secondary | ICD-10-CM | POA: Diagnosis present

## 2023-10-22 DIAGNOSIS — Z833 Family history of diabetes mellitus: Secondary | ICD-10-CM

## 2023-10-22 DIAGNOSIS — A419 Sepsis, unspecified organism: Secondary | ICD-10-CM | POA: Diagnosis not present

## 2023-10-22 DIAGNOSIS — I480 Paroxysmal atrial fibrillation: Secondary | ICD-10-CM | POA: Diagnosis present

## 2023-10-22 DIAGNOSIS — Z91018 Allergy to other foods: Secondary | ICD-10-CM

## 2023-10-22 DIAGNOSIS — Z7984 Long term (current) use of oral hypoglycemic drugs: Secondary | ICD-10-CM

## 2023-10-22 DIAGNOSIS — I272 Pulmonary hypertension, unspecified: Secondary | ICD-10-CM | POA: Diagnosis present

## 2023-10-22 DIAGNOSIS — G959 Disease of spinal cord, unspecified: Secondary | ICD-10-CM | POA: Diagnosis not present

## 2023-10-22 DIAGNOSIS — E669 Obesity, unspecified: Secondary | ICD-10-CM | POA: Diagnosis present

## 2023-10-22 DIAGNOSIS — Z888 Allergy status to other drugs, medicaments and biological substances status: Secondary | ICD-10-CM

## 2023-10-22 DIAGNOSIS — E039 Hypothyroidism, unspecified: Secondary | ICD-10-CM | POA: Diagnosis present

## 2023-10-22 DIAGNOSIS — M4802 Spinal stenosis, cervical region: Principal | ICD-10-CM

## 2023-10-22 DIAGNOSIS — Z79899 Other long term (current) drug therapy: Secondary | ICD-10-CM

## 2023-10-22 DIAGNOSIS — Z981 Arthrodesis status: Principal | ICD-10-CM

## 2023-10-22 DIAGNOSIS — Z885 Allergy status to narcotic agent status: Secondary | ICD-10-CM

## 2023-10-22 DIAGNOSIS — E785 Hyperlipidemia, unspecified: Secondary | ICD-10-CM | POA: Diagnosis present

## 2023-10-22 DIAGNOSIS — I110 Hypertensive heart disease with heart failure: Secondary | ICD-10-CM | POA: Diagnosis present

## 2023-10-22 DIAGNOSIS — J440 Chronic obstructive pulmonary disease with acute lower respiratory infection: Secondary | ICD-10-CM | POA: Diagnosis present

## 2023-10-22 DIAGNOSIS — Z91048 Other nonmedicinal substance allergy status: Secondary | ICD-10-CM

## 2023-10-22 DIAGNOSIS — K219 Gastro-esophageal reflux disease without esophagitis: Secondary | ICD-10-CM | POA: Diagnosis present

## 2023-10-22 DIAGNOSIS — F419 Anxiety disorder, unspecified: Secondary | ICD-10-CM | POA: Diagnosis present

## 2023-10-22 DIAGNOSIS — Z881 Allergy status to other antibiotic agents status: Secondary | ICD-10-CM

## 2023-10-22 DIAGNOSIS — Z8261 Family history of arthritis: Secondary | ICD-10-CM

## 2023-10-22 DIAGNOSIS — Z6832 Body mass index (BMI) 32.0-32.9, adult: Secondary | ICD-10-CM

## 2023-10-22 DIAGNOSIS — Z823 Family history of stroke: Secondary | ICD-10-CM

## 2023-10-22 DIAGNOSIS — I2510 Atherosclerotic heart disease of native coronary artery without angina pectoris: Secondary | ICD-10-CM | POA: Diagnosis present

## 2023-10-22 DIAGNOSIS — Z8616 Personal history of COVID-19: Secondary | ICD-10-CM

## 2023-10-22 DIAGNOSIS — Z825 Family history of asthma and other chronic lower respiratory diseases: Secondary | ICD-10-CM

## 2023-10-22 DIAGNOSIS — M5442 Lumbago with sciatica, left side: Secondary | ICD-10-CM | POA: Diagnosis present

## 2023-10-22 DIAGNOSIS — Z01818 Encounter for other preprocedural examination: Secondary | ICD-10-CM

## 2023-10-22 DIAGNOSIS — G9589 Other specified diseases of spinal cord: Secondary | ICD-10-CM

## 2023-10-22 DIAGNOSIS — Z79890 Hormone replacement therapy (postmenopausal): Secondary | ICD-10-CM

## 2023-10-22 DIAGNOSIS — Z7901 Long term (current) use of anticoagulants: Secondary | ICD-10-CM

## 2023-10-22 HISTORY — PX: ANTERIOR CERVICAL DECOMP/DISCECTOMY FUSION: SHX1161

## 2023-10-22 HISTORY — DX: Spinal stenosis, cervical region: M48.02

## 2023-10-22 LAB — GLUCOSE, CAPILLARY
Glucose-Capillary: 155 mg/dL — ABNORMAL HIGH (ref 70–99)
Glucose-Capillary: 400 mg/dL — ABNORMAL HIGH (ref 70–99)

## 2023-10-22 LAB — POCT I-STAT, CHEM 8
BUN: 22 mg/dL (ref 8–23)
Calcium, Ion: 0.98 mmol/L — ABNORMAL LOW (ref 1.15–1.40)
Chloride: 106 mmol/L (ref 98–111)
Creatinine, Ser: 0.5 mg/dL (ref 0.44–1.00)
Glucose, Bld: 160 mg/dL — ABNORMAL HIGH (ref 70–99)
HCT: 46 % (ref 36.0–46.0)
Hemoglobin: 15.6 g/dL — ABNORMAL HIGH (ref 12.0–15.0)
Potassium: 4.7 mmol/L (ref 3.5–5.1)
Sodium: 137 mmol/L (ref 135–145)
TCO2: 23 mmol/L (ref 22–32)

## 2023-10-22 SURGERY — ANTERIOR CERVICAL DECOMPRESSION/DISCECTOMY FUSION 1 LEVEL
Anesthesia: General | Site: Spine Cervical

## 2023-10-22 MED ORDER — INSULIN ASPART 100 UNIT/ML IJ SOLN
0.0000 [IU] | Freq: Every day | INTRAMUSCULAR | Status: DC
  Administered 2023-10-22: 5 [IU] via SUBCUTANEOUS
  Administered 2023-10-23 – 2023-10-24 (×2): 2 [IU] via SUBCUTANEOUS
  Administered 2023-10-25: 3 [IU] via SUBCUTANEOUS
  Administered 2023-10-26: 2 [IU] via SUBCUTANEOUS
  Filled 2023-10-22 (×6): qty 1

## 2023-10-22 MED ORDER — ACETAMINOPHEN 325 MG PO TABS: 650.0000 mg | ORAL_TABLET | ORAL | Status: DC | PRN

## 2023-10-22 MED ORDER — FENTANYL CITRATE (PF) 100 MCG/2ML IJ SOLN
INTRAMUSCULAR | Status: AC
  Filled 2023-10-22: qty 2

## 2023-10-22 MED ORDER — ACETAMINOPHEN 10 MG/ML IV SOLN
INTRAVENOUS | Status: DC | PRN
  Administered 2023-10-22: 1000 mg via INTRAVENOUS

## 2023-10-22 MED ORDER — HYDROMORPHONE HCL 1 MG/ML IJ SOLN
0.5000 mg | INTRAMUSCULAR | Status: AC | PRN
  Administered 2023-10-22: 0.5 mg via INTRAVENOUS
  Filled 2023-10-22: qty 0.5

## 2023-10-22 MED ORDER — OXYCODONE HCL 5 MG PO TABS
10.0000 mg | ORAL_TABLET | ORAL | Status: DC | PRN
  Administered 2023-10-23 – 2023-10-25 (×8): 10 mg via ORAL
  Filled 2023-10-22 (×8): qty 2

## 2023-10-22 MED ORDER — ENOXAPARIN SODIUM 40 MG/0.4ML IJ SOSY
40.0000 mg | PREFILLED_SYRINGE | INTRAMUSCULAR | Status: DC
  Administered 2023-10-23 – 2023-10-28 (×6): 40 mg via SUBCUTANEOUS
  Filled 2023-10-22 (×6): qty 0.4

## 2023-10-22 MED ORDER — PHENYLEPHRINE 80 MCG/ML (10ML) SYRINGE FOR IV PUSH (FOR BLOOD PRESSURE SUPPORT)
PREFILLED_SYRINGE | INTRAVENOUS | Status: DC | PRN
Start: 2023-10-22 — End: 2023-10-22
  Administered 2023-10-22: 80 ug via INTRAVENOUS

## 2023-10-22 MED ORDER — SODIUM CHLORIDE 0.9 % IV SOLN
250.0000 mL | INTRAVENOUS | Status: AC
  Administered 2023-10-22: 250 mL via INTRAVENOUS

## 2023-10-22 MED ORDER — ONDANSETRON HCL 4 MG PO TABS
4.0000 mg | ORAL_TABLET | Freq: Four times a day (QID) | ORAL | Status: DC | PRN
  Administered 2023-10-25: 4 mg via ORAL
  Filled 2023-10-22: qty 1

## 2023-10-22 MED ORDER — GLYCOPYRROLATE 0.2 MG/ML IJ SOLN
INTRAMUSCULAR | Status: DC | PRN
  Administered 2023-10-22: .2 mg via INTRAVENOUS

## 2023-10-22 MED ORDER — DROPERIDOL 2.5 MG/ML IJ SOLN
INTRAMUSCULAR | Status: AC
  Filled 2023-10-22: qty 2

## 2023-10-22 MED ORDER — PHENOL 1.4 % MT LIQD
1.0000 | OROMUCOSAL | Status: DC | PRN
  Administered 2023-10-23 – 2023-10-25 (×5): 1 via OROMUCOSAL
  Filled 2023-10-22: qty 177

## 2023-10-22 MED ORDER — GLYCOPYRROLATE 0.2 MG/ML IJ SOLN
INTRAMUSCULAR | Status: AC
  Filled 2023-10-22: qty 1

## 2023-10-22 MED ORDER — LEVOTHYROXINE SODIUM 88 MCG PO TABS
88.0000 ug | ORAL_TABLET | Freq: Every day | ORAL | Status: DC
  Administered 2023-10-23 – 2023-10-28 (×6): 88 ug via ORAL
  Filled 2023-10-22 (×6): qty 1

## 2023-10-22 MED ORDER — PROPOFOL 10 MG/ML IV BOLUS
INTRAVENOUS | Status: DC | PRN
  Administered 2023-10-22: 100 mg via INTRAVENOUS
  Administered 2023-10-22: 140 ug/kg/min via INTRAVENOUS

## 2023-10-22 MED ORDER — ACETAMINOPHEN 10 MG/ML IV SOLN
INTRAVENOUS | Status: AC
  Filled 2023-10-22: qty 100

## 2023-10-22 MED ORDER — FLUTICASONE FUROATE-VILANTEROL 200-25 MCG/ACT IN AEPB
1.0000 | INHALATION_SPRAY | Freq: Every day | RESPIRATORY_TRACT | Status: DC
Start: 2023-10-23 — End: 2023-10-28
  Administered 2023-10-23 – 2023-10-28 (×6): 1 via RESPIRATORY_TRACT
  Filled 2023-10-22: qty 28

## 2023-10-22 MED ORDER — FENTANYL CITRATE (PF) 100 MCG/2ML IJ SOLN
INTRAMUSCULAR | Status: DC | PRN
  Administered 2023-10-22 (×2): 50 ug via INTRAVENOUS

## 2023-10-22 MED ORDER — MAGNESIUM CITRATE PO SOLN: 1.0000 | Freq: Once | ORAL | Status: DC | PRN

## 2023-10-22 MED ORDER — SODIUM CHLORIDE 0.9% FLUSH
3.0000 mL | Freq: Two times a day (BID) | INTRAVENOUS | Status: DC
  Administered 2023-10-22 – 2023-10-28 (×9): 3 mL via INTRAVENOUS

## 2023-10-22 MED ORDER — CHLORHEXIDINE GLUCONATE 0.12 % MT SOLN
OROMUCOSAL | Status: AC
  Filled 2023-10-22: qty 15

## 2023-10-22 MED ORDER — DROPERIDOL 2.5 MG/ML IJ SOLN
0.6250 mg | Freq: Once | INTRAMUSCULAR | Status: AC
  Administered 2023-10-22: 0.625 mg via INTRAVENOUS

## 2023-10-22 MED ORDER — DEXAMETHASONE SODIUM PHOSPHATE 10 MG/ML IJ SOLN
INTRAMUSCULAR | Status: AC
  Filled 2023-10-22: qty 1

## 2023-10-22 MED ORDER — METHOCARBAMOL 1000 MG/10ML IJ SOLN: 500.0000 mg | Freq: Four times a day (QID) | INTRAMUSCULAR | Status: DC | PRN

## 2023-10-22 MED ORDER — DOCUSATE SODIUM 100 MG PO CAPS
100.0000 mg | ORAL_CAPSULE | Freq: Two times a day (BID) | ORAL | Status: DC
  Administered 2023-10-22 – 2023-10-28 (×12): 100 mg via ORAL
  Filled 2023-10-22 (×12): qty 1

## 2023-10-22 MED ORDER — FENTANYL CITRATE (PF) 100 MCG/2ML IJ SOLN
25.0000 ug | INTRAMUSCULAR | Status: DC | PRN
  Administered 2023-10-22 (×2): 50 ug via INTRAVENOUS

## 2023-10-22 MED ORDER — KETAMINE HCL 50 MG/5ML IJ SOSY
PREFILLED_SYRINGE | INTRAMUSCULAR | Status: DC | PRN
  Administered 2023-10-22: 20 mg via INTRAVENOUS

## 2023-10-22 MED ORDER — POLYETHYLENE GLYCOL 3350 17 G PO PACK
17.0000 g | PACK | Freq: Every day | ORAL | Status: DC | PRN
  Administered 2023-10-24: 17 g via ORAL
  Filled 2023-10-22: qty 1

## 2023-10-22 MED ORDER — SUCCINYLCHOLINE CHLORIDE 200 MG/10ML IV SOSY
PREFILLED_SYRINGE | INTRAVENOUS | Status: AC
  Filled 2023-10-22: qty 10

## 2023-10-22 MED ORDER — METHOCARBAMOL 500 MG PO TABS
ORAL_TABLET | ORAL | Status: AC
Start: 2023-10-22 — End: ?
  Filled 2023-10-22: qty 1

## 2023-10-22 MED ORDER — SENNA 8.6 MG PO TABS
1.0000 | ORAL_TABLET | Freq: Two times a day (BID) | ORAL | Status: DC
  Administered 2023-10-22 – 2023-10-28 (×12): 8.6 mg via ORAL
  Filled 2023-10-22 (×12): qty 1

## 2023-10-22 MED ORDER — MIDAZOLAM HCL 2 MG/2ML IJ SOLN
INTRAMUSCULAR | Status: AC
  Filled 2023-10-22: qty 2

## 2023-10-22 MED ORDER — LIDOCAINE HCL (PF) 2 % IJ SOLN
INTRAMUSCULAR | Status: AC
  Filled 2023-10-22: qty 5

## 2023-10-22 MED ORDER — GLIPIZIDE 5 MG PO TABS
2.5000 mg | ORAL_TABLET | Freq: Every day | ORAL | Status: DC
  Administered 2023-10-23 – 2023-10-26 (×4): 2.5 mg via ORAL
  Filled 2023-10-22 (×4): qty 0.5

## 2023-10-22 MED ORDER — ONDANSETRON HCL 4 MG/2ML IJ SOLN
INTRAMUSCULAR | Status: AC
  Filled 2023-10-22: qty 2

## 2023-10-22 MED ORDER — MIDAZOLAM HCL 5 MG/5ML IJ SOLN
INTRAMUSCULAR | Status: DC | PRN
  Administered 2023-10-22: 1 mg via INTRAVENOUS

## 2023-10-22 MED ORDER — POTASSIUM CHLORIDE CRYS ER 10 MEQ PO TBCR
10.0000 meq | EXTENDED_RELEASE_TABLET | Freq: Every day | ORAL | Status: DC
  Administered 2023-10-22 – 2023-10-28 (×7): 10 meq via ORAL
  Filled 2023-10-22 (×7): qty 1

## 2023-10-22 MED ORDER — SUCCINYLCHOLINE CHLORIDE 200 MG/10ML IV SOSY
PREFILLED_SYRINGE | INTRAVENOUS | Status: DC | PRN
  Administered 2023-10-22: 100 mg via INTRAVENOUS

## 2023-10-22 MED ORDER — PROPOFOL 1000 MG/100ML IV EMUL
INTRAVENOUS | Status: AC
  Filled 2023-10-22: qty 100

## 2023-10-22 MED ORDER — OXYCODONE HCL 5 MG PO TABS
5.0000 mg | ORAL_TABLET | ORAL | Status: DC | PRN
  Administered 2023-10-26 – 2023-10-27 (×2): 5 mg via ORAL
  Filled 2023-10-22 (×2): qty 1

## 2023-10-22 MED ORDER — PHENYLEPHRINE 80 MCG/ML (10ML) SYRINGE FOR IV PUSH (FOR BLOOD PRESSURE SUPPORT)
PREFILLED_SYRINGE | INTRAVENOUS | Status: AC
  Filled 2023-10-22: qty 10

## 2023-10-22 MED ORDER — ATORVASTATIN CALCIUM 20 MG PO TABS
10.0000 mg | ORAL_TABLET | Freq: Every day | ORAL | Status: DC
  Administered 2023-10-22 – 2023-10-28 (×7): 10 mg via ORAL
  Filled 2023-10-22 (×7): qty 1

## 2023-10-22 MED ORDER — ALLOPURINOL 100 MG PO TABS
100.0000 mg | ORAL_TABLET | Freq: Every day | ORAL | Status: DC
  Administered 2023-10-22 – 2023-10-27 (×6): 100 mg via ORAL
  Filled 2023-10-22 (×7): qty 1

## 2023-10-22 MED ORDER — SURGIFLO WITH THROMBIN (HEMOSTATIC MATRIX KIT) OPTIME
TOPICAL | Status: DC | PRN
  Administered 2023-10-22: 1 via TOPICAL

## 2023-10-22 MED ORDER — HYDROMORPHONE HCL 1 MG/ML IJ SOLN
0.5000 mg | INTRAMUSCULAR | Status: DC | PRN
  Administered 2023-10-22 (×2): 0.5 mg via INTRAVENOUS

## 2023-10-22 MED ORDER — FUROSEMIDE 20 MG PO TABS
40.0000 mg | ORAL_TABLET | Freq: Every day | ORAL | Status: DC
  Administered 2023-10-22 – 2023-10-25 (×4): 40 mg via ORAL
  Filled 2023-10-22 (×4): qty 2

## 2023-10-22 MED ORDER — 0.9 % SODIUM CHLORIDE (POUR BTL) OPTIME
TOPICAL | Status: DC | PRN
  Administered 2023-10-22: 500 mL

## 2023-10-22 MED ORDER — PROPOFOL 10 MG/ML IV BOLUS
INTRAVENOUS | Status: AC
  Filled 2023-10-22: qty 20

## 2023-10-22 MED ORDER — ACETAMINOPHEN 500 MG PO TABS
1000.0000 mg | ORAL_TABLET | Freq: Four times a day (QID) | ORAL | Status: DC
  Administered 2023-10-22 – 2023-10-28 (×22): 1000 mg via ORAL
  Filled 2023-10-22 (×24): qty 2

## 2023-10-22 MED ORDER — METHOCARBAMOL 500 MG PO TABS
500.0000 mg | ORAL_TABLET | Freq: Four times a day (QID) | ORAL | Status: DC | PRN
  Administered 2023-10-22 – 2023-10-24 (×3): 500 mg via ORAL
  Filled 2023-10-22 (×3): qty 1

## 2023-10-22 MED ORDER — ALBUTEROL SULFATE (2.5 MG/3ML) 0.083% IN NEBU: 2.5000 mg | INHALATION_SOLUTION | Freq: Four times a day (QID) | RESPIRATORY_TRACT | Status: DC | PRN

## 2023-10-22 MED ORDER — GABAPENTIN 400 MG PO CAPS
800.0000 mg | ORAL_CAPSULE | Freq: Three times a day (TID) | ORAL | Status: DC
Start: 2023-10-22 — End: 2023-10-28
  Administered 2023-10-22 – 2023-10-28 (×17): 800 mg via ORAL
  Filled 2023-10-22 (×17): qty 2

## 2023-10-22 MED ORDER — SORBITOL 70 % SOLN: 30.0000 mL | Freq: Every day | Status: DC | PRN

## 2023-10-22 MED ORDER — ACETAMINOPHEN 650 MG RE SUPP: 650.0000 mg | RECTAL | Status: DC | PRN

## 2023-10-22 MED ORDER — ONDANSETRON HCL 4 MG/2ML IJ SOLN: 4.0000 mg | Freq: Four times a day (QID) | INTRAMUSCULAR | Status: DC | PRN

## 2023-10-22 MED ORDER — CEFAZOLIN SODIUM-DEXTROSE 2-4 GM/100ML-% IV SOLN
INTRAVENOUS | Status: AC
  Filled 2023-10-22: qty 100

## 2023-10-22 MED ORDER — METFORMIN HCL 500 MG PO TABS
1000.0000 mg | ORAL_TABLET | Freq: Two times a day (BID) | ORAL | Status: DC
Start: 2023-10-23 — End: 2023-10-26
  Administered 2023-10-23 – 2023-10-26 (×7): 1000 mg via ORAL
  Filled 2023-10-22 (×7): qty 2

## 2023-10-22 MED ORDER — KETAMINE HCL 50 MG/5ML IJ SOSY
PREFILLED_SYRINGE | INTRAMUSCULAR | Status: AC
  Filled 2023-10-22: qty 5

## 2023-10-22 MED ORDER — KETOROLAC TROMETHAMINE 15 MG/ML IJ SOLN
7.5000 mg | Freq: Four times a day (QID) | INTRAMUSCULAR | Status: AC
  Administered 2023-10-22 – 2023-10-23 (×4): 7.5 mg via INTRAVENOUS
  Filled 2023-10-22 (×4): qty 1

## 2023-10-22 MED ORDER — UMECLIDINIUM BROMIDE 62.5 MCG/ACT IN AEPB
1.0000 | INHALATION_SPRAY | Freq: Every day | RESPIRATORY_TRACT | Status: DC
  Administered 2023-10-23 – 2023-10-28 (×6): 1 via RESPIRATORY_TRACT
  Filled 2023-10-22: qty 7

## 2023-10-22 MED ORDER — OXYCODONE HCL 5 MG PO TABS: 5.0000 mg | ORAL_TABLET | Freq: Once | ORAL | Status: DC | PRN

## 2023-10-22 MED ORDER — PHENYLEPHRINE HCL-NACL 20-0.9 MG/250ML-% IV SOLN
INTRAVENOUS | Status: DC | PRN
  Administered 2023-10-22: 40 ug/min via INTRAVENOUS

## 2023-10-22 MED ORDER — HYDROMORPHONE HCL 1 MG/ML IJ SOLN
INTRAMUSCULAR | Status: AC
  Filled 2023-10-22: qty 1

## 2023-10-22 MED ORDER — OXYCODONE HCL 5 MG/5ML PO SOLN: 5.0000 mg | Freq: Once | ORAL | Status: DC | PRN

## 2023-10-22 MED ORDER — SODIUM CHLORIDE 0.9% FLUSH
3.0000 mL | INTRAVENOUS | Status: DC | PRN
  Administered 2023-10-22: 3 mL via INTRAVENOUS

## 2023-10-22 MED ORDER — EPHEDRINE 5 MG/ML INJ
INTRAVENOUS | Status: AC
  Filled 2023-10-22: qty 5

## 2023-10-22 MED ORDER — MENTHOL 3 MG MT LOZG
1.0000 | LOZENGE | OROMUCOSAL | Status: DC | PRN
  Administered 2023-10-23 – 2023-10-25 (×5): 3 mg via ORAL
  Filled 2023-10-22 (×2): qty 9

## 2023-10-22 MED ORDER — ONDANSETRON HCL 4 MG/2ML IJ SOLN
INTRAMUSCULAR | Status: DC | PRN
  Administered 2023-10-22: 4 mg via INTRAVENOUS

## 2023-10-22 MED ORDER — DEXAMETHASONE SODIUM PHOSPHATE 10 MG/ML IJ SOLN
INTRAMUSCULAR | Status: DC | PRN
  Administered 2023-10-22: 10 mg via INTRAVENOUS

## 2023-10-22 MED ORDER — INSULIN ASPART 100 UNIT/ML IJ SOLN
0.0000 [IU] | Freq: Three times a day (TID) | INTRAMUSCULAR | Status: DC
  Administered 2023-10-23 (×2): 3 [IU] via SUBCUTANEOUS
  Administered 2023-10-23 – 2023-10-24 (×2): 8 [IU] via SUBCUTANEOUS
  Administered 2023-10-24 (×2): 3 [IU] via SUBCUTANEOUS
  Administered 2023-10-25: 5 [IU] via SUBCUTANEOUS
  Administered 2023-10-25 – 2023-10-27 (×6): 3 [IU] via SUBCUTANEOUS
  Administered 2023-10-27: 5 [IU] via SUBCUTANEOUS
  Administered 2023-10-28 (×2): 3 [IU] via SUBCUTANEOUS
  Filled 2023-10-22 (×16): qty 1

## 2023-10-22 MED ORDER — PIOGLITAZONE HCL 15 MG PO TABS
15.0000 mg | ORAL_TABLET | Freq: Every morning | ORAL | Status: DC
  Administered 2023-10-23 – 2023-10-28 (×6): 15 mg via ORAL
  Filled 2023-10-22 (×6): qty 1

## 2023-10-22 SURGICAL SUPPLY — 48 items
ALLOGRAFT BONE FIBER KORE 1CC (Bone Implant) IMPLANT
BASIN KIT SINGLE STR (MISCELLANEOUS) ×1 IMPLANT
BUR NEURO DRILL SOFT 3.0X3.8M (BURR) ×1 IMPLANT
DERMABOND ADVANCED .7 DNX12 (GAUZE/BANDAGES/DRESSINGS) ×1 IMPLANT
DRAIN CHANNEL JP 10F RND 20C F (MISCELLANEOUS) IMPLANT
DRAPE C ARM PK CFD 31 SPINE (DRAPES) ×1 IMPLANT
DRAPE INCISE 23X17 STRL (DRAPES) IMPLANT
DRAPE INCISE IOBAN 23X17 STRL (DRAPES) ×1 IMPLANT
DRAPE LAPAROTOMY 77X122 PED (DRAPES) ×1 IMPLANT
DRAPE MICROSCOPE SPINE 48X150 (DRAPES) ×1 IMPLANT
DRSG TEGADERM 4X4.75 (GAUZE/BANDAGES/DRESSINGS) IMPLANT
ELECT REM PT RETURN 9FT ADLT (ELECTROSURGICAL) ×1 IMPLANT
ELECTRODE REM PT RTRN 9FT ADLT (ELECTROSURGICAL) ×1 IMPLANT
EVACUATOR 1/8 PVC DRAIN (DRAIN) IMPLANT
EVACUATOR SILICONE 100CC (DRAIN) IMPLANT
FEE INTRAOP CADWELL SUPPLY NCS (MISCELLANEOUS) IMPLANT
FEE INTRAOP MONITOR IMPULS NCS (MISCELLANEOUS) IMPLANT
GAUZE SPONGE 2X2 STRL 8-PLY (GAUZE/BANDAGES/DRESSINGS) IMPLANT
GLOVE BIOGEL PI IND STRL 6.5 (GLOVE) ×1 IMPLANT
GLOVE SURG SYN 6.5 ES PF (GLOVE) ×1 IMPLANT
GLOVE SURG SYN 6.5 PF PI (GLOVE) ×1 IMPLANT
GLOVE SURG SYN 8.5 E (GLOVE) ×3 IMPLANT
GLOVE SURG SYN 8.5 PF PI (GLOVE) ×3 IMPLANT
GOWN SRG LRG LVL 4 IMPRV REINF (GOWNS) ×1 IMPLANT
GOWN SRG XL LVL 3 NONREINFORCE (GOWNS) ×1 IMPLANT
INTRAOP CADWELL SUPPLY FEE NCS (MISCELLANEOUS) ×1 IMPLANT
INTRAOP MONITOR FEE IMPULS NCS (MISCELLANEOUS) ×1 IMPLANT
KIT TURNOVER KIT A (KITS) ×1 IMPLANT
MANIFOLD NEPTUNE II (INSTRUMENTS) ×1 IMPLANT
NDL SAFETY ECLIPSE 18X1.5 (NEEDLE) ×1 IMPLANT
NS IRRIG 500ML POUR BTL (IV SOLUTION) ×1 IMPLANT
PACK LAMINECTOMY ARMC (PACKS) ×1 IMPLANT
PAD ARMBOARD POSITIONER FOAM (MISCELLANEOUS) ×2 IMPLANT
PIN CASPAR 14 (PIN) ×1 IMPLANT
PIN CASPAR 14MM (PIN) ×1 IMPLANT
PLATE ACP 1.6 18 (Plate) IMPLANT
SCREW ACP 3.5X17 S/D VARIA (Screw) IMPLANT
SPACER C HEDRON 12X14 7M 7D (Spacer) IMPLANT
SPONGE KITTNER 5P (MISCELLANEOUS) ×1 IMPLANT
STAPLER SKIN PROX 35W (STAPLE) IMPLANT
SURGIFLO W/THROMBIN 8M KIT (HEMOSTASIS) ×1 IMPLANT
SUT ETHILON 3-0 FS-10 30 BLK (SUTURE) IMPLANT
SUT STRATA 3-0 15 PS-2 (SUTURE) ×1 IMPLANT
SUT VIC AB 3-0 SH 8-18 (SUTURE) ×1 IMPLANT
SUTURE EHLN 3-0 FS-10 30 BLK (SUTURE) IMPLANT
SYR 20ML LL LF (SYRINGE) ×1 IMPLANT
TAPE CLOTH 3X10 WHT NS LF (GAUZE/BANDAGES/DRESSINGS) ×2 IMPLANT
TRAP FLUID SMOKE EVACUATOR (MISCELLANEOUS) ×1 IMPLANT

## 2023-10-22 NOTE — Discharge Instructions (Signed)
 Your surgeon has performed an operation on your cervical spine (neck) to relieve pressure on the spinal cord and/or nerves. This involved making an incision in the front of your neck and removing one or more of the discs that support your spine. Next, a small piece of bone, a titanium plate, and screws were used to fuse two or more of the vertebrae (bones) together.  The following are instructions to help in your recovery once you have been discharged from the hospital. Even if you feel well, it is important that you follow these activity guidelines. If you do not let your neck heal properly from the surgery, you can increase the chance of return of your symptoms and other complications.  * Do not take anti-inflammatory medications for 3 months after surgery (naproxen [Aleve], ibuprofen [Advil, Motrin], etc.). These medications can prevent your bones from healing properly.  Celebrex, if prescribed, is ok to take.  Activity    No bending, lifting, or twisting ("BLT"). Avoid lifting objects heavier than 10 pounds (gallon milk jug).  Where possible, avoid household activities that involve lifting, bending, reaching, pushing, or pulling such as laundry, vacuuming, grocery shopping, and childcare. Try to arrange for help from friends and family for these activities while your back heals.  Increase physical activity slowly as tolerated.  Taking short walks is encouraged, but avoid strenuous exercise. Do not jog, run, bicycle, lift weights, or participate in any other exercises unless specifically allowed by your doctor.  Talk to your doctor before resuming sexual activity.  You should not drive until cleared by your doctor.  Until released by your doctor, you should not return to work or school.  You should rest at home and let your body heal.   You may shower three days after your surgery.  After showering, lightly dab your incision dry. Do not take a tub bath or go swimming until approved by your  doctor at your follow-up appointment.  If your doctor ordered a cervical collar (neck brace) for you, you should wear it whenever you are out of bed. You may remove it when lying down or sleeping, but you should wear it at all other times. Not all neck surgeries require a cervical collar.  If you smoke, we strongly recommend that you quit.  Smoking has been proven to interfere with normal bone healing and will dramatically reduce the success rate of your surgery. Please contact [facility] (800-QUIT-NOW) and use the resources at www.[facility].com for assistance in stopping smoking.  Surgical Incision   If you have a dressing on your incision, you may remove it two days after your surgery. Keep your incision area clean and dry.  If you have staples or stitches on your incision, you should have a follow up scheduled for removal. If you do not have staples or stitches, you will have steri-strips (small pieces of surgical tape) or Dermabond glue. The steri-strips/glue should begin to peel away within about a week (it is fine if the steri-strips fall off before then). If the strips are still in place one week after your surgery, you may gently remove them.  Diet           You may return to your usual diet. However, you may experience discomfort when swallowing in the first month after your surgery. This is normal. You may find that softer foods are more comfortable for you to swallow. Be sure to stay hydrated.  When to Contact us  You may experience pain in your  neck and/or pain between your shoulder blades. This is normal and should improve in the next few weeks with the help of pain medication, muscle relaxers, and rest. Some patients report that a warm compress on the back of the neck or between the shoulder blades helps.  However, should you experience any of the following, contact us immediately: New numbness or weakness Pain that is progressively getting worse, and is not relieved by your pain  medication, muscle relaxers, rest, and warm compresses Bleeding, redness, swelling, pain, or drainage from surgical incision Chills or flu-like symptoms Fever greater than 101.0 F (38.3 C) Inability to eat, drink fluids, or take medications Problems with bowel or bladder functions Difficulty breathing or shortness of breath Warmth, tenderness, or swelling in your calf Contact Information How to contact us:  If you have any questions/concerns before or after surgery, you can reach [place] at [phone], or you can send a mychart message. We can be reached by phone or mychart 8am-4pm, Monday-Friday.  *Please note: Calls after 4pm are forwarded to a third party answering service. Mychart messages are not routinely monitored during evenings, weekends, and holidays. Please call our office to contact the answering service for urgent concerns during non-business hours.

## 2023-10-22 NOTE — Op Note (Signed)
 Indications: [name] is a 78 y.o. 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   Preoperative Note:    Risks of surgery discussed include: infection, bleeding, stroke, coma, death, paralysis, CSF leak, nerve/spinal cord injury, numbness, tingling, weakness, complex regional pain syndrome, recurrent stenosis and/or disc herniation, vascular injury, development of instability, neck/back pain, need for further surgery, persistent symptoms, development of deformity, and the risks of anesthesia. The patient understood these risks and agreed to proceed.  Operative Note:   Procedure:  1) Anterior cervical diskectomy and fusion at C5-6 2) Anterior cervical instrumentation at C5-6 3) Insertion of biomechanical device at C5-6   Procedure: After obtaining informed consent, the patient taken to the operating room, placed in supine position, general anesthesia induced.  The patient had a small shoulder roll placed behind their shoulders.  The patient received preop antibiotics and IV Decadron.  The patient had a neck incision outlined, was prepped and draped in usual sterile fashion. The incision was injected with local anesthetic.   An incision was opened, dissection taken down medial to the carotid artery and jugular vein, lateral to the trachea and esophagus.  The prevertebral fascia was identified, and a localizing x-ray demonstrated the correct level.  The longus colli were dissected laterally, and self-retaining retractors placed to open the operative field. The microscope was then brought  into the field.  With this complete, distractor pins were placed in the vertebral bodies of C5 and C6. The distractor was placed, and the annulus at C5/6 was opened using a bovie.  Curettes and pituitary rongeurs used to remove the majority of disk, then the drill was used to remove the posterior osteophyte, expose the posterior longitudinal ligament, and begin the foraminotomies. The nerve hook was used to elevate the posterior longitudinal ligament, which was then removed with Kerrison rongeurs to complete decompression of the spinal cord. The Kerrison rongeurs were then used to complete the foraminotomies bilaterally to decompress the nerve roots. The nerve hook could be passed out each foramen, ensuring decompression of the nerve roots. Meticulous hemostasis was obtained. A biomechanical device (Globus Hedron C 7 mm height x 14 mm width by 12 mm depth) was placed at C5/6. The device had been filled with demineralized bone matrix for aid in arthrodesis.  Please note that the procedure included removal of the disc, removal of the posterior osteophytes, and removal of the posterior longitudinal ligament to ensure decompression of the spinal cord.  Additionally, foraminotomies were performed on both sides of the spinal canal to decompress the nerve roots.  The caspar distractor was removed, and bone wax used for hemostasis. A separate, 18 mm 2 segment Nuvasive ACP plate was chosen to bridge.  Two screws placed in each vertebral body, respectively making sure the screws were behind the locking mechanism.  Final AP and lateral radiographs were taken.   Please note that the plate is not inclusive to the biomechanical device.  The anchoring mechanism of the plate is completely separate from the biomechanical device.  With everything in good position, the wound was irrigated copiously and meticulous hemostasis obtained.  Wound was closed in 2 layers using interrupted inverted 3-0 Vicryl sutures.  The wound was  dressed with dermabond, the head of bed at 30 degrees, taken to recovery room in stable condition.  No new postop neurological deficits were identified.  Sponge and pattie counts were correct at the end of the procedure.   I performed the entire procedure with the assistance of Manning Charity PA as an Designer, television/film set. An assistant was required for this procedure due to the complexity.  The assistant provided assistance in tissue manipulation and suction, and was required for the successful and safe performance of the procedure. I performed the critical portions of the procedure.   Venetia Night MD

## 2023-10-22 NOTE — Anesthesia Preprocedure Evaluation (Signed)
 Anesthesia Evaluation  Patient identified by MRN, date of birth, ID band Patient awake    Reviewed: Allergy & Precautions, NPO status , Patient's Chart, lab work & pertinent test results  History of Anesthesia Complications (+) PONV, PROLONGED EMERGENCE, Emergence Delirium and history of anesthetic complications  Airway Mallampati: III  TM Distance: <3 FB Neck ROM: full    Dental  (+) Chipped, Poor Dentition, Missing   Pulmonary shortness of breath and with exertion, asthma , sleep apnea , COPD   Pulmonary exam normal        Cardiovascular hypertension, (-) angina + CAD, +CHF and + DOE  Normal cardiovascular exam+ dysrhythmias      Neuro/Psych  Headaches PSYCHIATRIC DISORDERS       Neuromuscular disease CVA    GI/Hepatic Neg liver ROS, hiatal hernia,GERD  Controlled,,  Endo/Other  diabetesHypothyroidism    Renal/GU Renal disease     Musculoskeletal   Abdominal   Peds  Hematology negative hematology ROS (+)   Anesthesia Other Findings Past Medical History: No date: (HFpEF) heart failure with preserved ejection fraction (HCC)     Comment:  a.) TTE 09/22/2014: EF >55%, no RWMAs, mild MAC, normal               RVSF, mild TR/PR, mod MR; b.) TTE 08/04/2019: EF >55%, no              RWMAs, normal RVSF, triv MR/TR; c.) TTE 06/14/2021: EF               60-65%, no RMWAs, normal RVSF, triv MR No date: Anginal pain (HCC) No date: Anxiety No date: Aortic atherosclerosis (HCC) No date: Arthritis No date: Asthma No date: Beta-blockers contraindicated     Comment:  a.) significant bradycardia in the past while on therapy No date: Bilateral lower extremity edema 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 No date: Cervical myelopathy (HCC) No date: Cervical spinal stenosis No date: Chronic bilateral low back pain with bilateral sciatica     Comment:  a.) s.p L4-L5 lateral lumbar interbody fusion 06/18/2023 No date: Complication  of anesthesia     Comment:  a.) PONV; b.) delayed emergence when she was younger 08/17/2023: Concussion No date: COPD (chronic obstructive pulmonary disease) (HCC) No date: Coronary artery disease     Comment:  a.) cCTA 11/01/2021: Ca+ score 174 (67th percentile for               age/sex match) --> FFR demonstrated no hemodynamically               significant stenosis No date: Depression No date: Diverticulosis No date: DOE (dyspnea on exertion) No date: Dyspnea No date: GERD (gastroesophageal reflux disease)     Comment:  a.) no daily Tx; s/p Nissen fundoplication No date: Gout No date: Heart murmur 05/12/2019: History of 2019 novel coronavirus disease (COVID-19) No date: History of hiatal hernia     Comment:  a.) s/p Nissen fundoplication No date: History of kidney stones No date: History of orthopnea No date: HLD (hyperlipidemia) No date: Hypertension No date: Hypothyroidism 2024: Lumbar stenosis No date: Memory loss     Comment:  a.) s/p "mini stroke" in 2021; b.) on               acetylcholinesterase inhibitor (donepazil) No date: Migraines No date: Mobitz type I Wenckebach atrioventricular block     Comment:  a.) holter study 01/27/2022 No date: On apixaban therapy No date: PAF (paroxysmal atrial fibrillation) (HCC)  Comment:  a.) CHA2DS2-VASc = 55 (age x2, sex, HFpEF, CVA x2, HTN,               vascular disease history, T2DM) as of 06/14/2023;  b.)               rate/rhythm maintained without pharmacological               intervention; chronically anticoagulated using standard               dose apixaban No date: Palpitations 05/2019: Pneumonia due to COVID-19 virus No date: PONV (postoperative nausea and vomiting) 01/27/2022: PSVT (paroxysmal supraventricular tachycardia) (HCC)     Comment:  a.) holter study 01/27/2022 - longest lasting 17 beats               at a rate of 109 bpm No date: Pulmonary HTN (HCC) No date: RBBB (right bundle branch block) No date:  Recurrent falls No date: Scleroderma (HCC) No date: Sleep apnea     Comment:  a.) does not require nocturnal PAP therapy 2021: Stroke (HCC)     Comment:  a.) "mild" per patient; no findings on imaging;               presented with transient aphasia, memory loss, extremity               numbness No date: T2DM (type 2 diabetes mellitus) (HCC) No date: Transfusion of blood product refused for religious reason  (Jehovah's witness) No date: Vertigo No date: Vitamin D deficiency  Past Surgical History: No date: ABDOMINAL HYSTERECTOMY     Comment:  ovaries intact 03/03/2022: ANKLE ARTHROSCOPY; Left     Comment:  Procedure: ANKLE ARTHROSCOPY;  Surgeon: Edwin Cap, DPM;  Location: [facility];  Service: Podiatry;                Laterality: Left;  GENERAL WITH POP AND SAPHENOUS BLOCK 06/18/2023: ANTERIOR LATERAL LUMBAR FUSION WITH PERCUTANEOUS SCREW 1  LEVEL; N/A     Comment:  Procedure: L4-5 LATERAL LUMBAR INTERBODY FUSION AND               POSTERIOR SPINAL FUSION;  Surgeon: Venetia Night,               MD;  Location: [facility];  Service: Neurosurgery;                Laterality: N/A; 06/18/2023: APPLICATION OF INTRAOPERATIVE CT SCAN; N/A     Comment:  Procedure: APPLICATION OF INTRAOPERATIVE CT SCAN;                Surgeon: Venetia Night, MD;  Location: [facility];                Service: Neurosurgery;  Laterality: N/A; No date: BLADDER SURGERY     Comment:  bladder tuck 03/20/2016: BREAST BIOPSY; Right     Comment:  neg x 2 area No date: CARDIAC CATHETERIZATION No date: CARPAL TUNNEL RELEASE; Bilateral 09/17/2018: CATARACT EXTRACTION W/PHACO; Left     Comment:  Procedure: CATARACT EXTRACTION PHACO AND INTRAOCULAR               LENS PLACEMENT (IOC) LEFT, DIABETIC;  Surgeon: Galen Manila, MD;  Location: [facility];  Service:  Ophthalmology;  Laterality: Left;  [place] 00:34 CDE               4.85 Fluid pack lot # [id number] 10/15/2018:  CATARACT EXTRACTION W/PHACO; Right     Comment:  Procedure: CATARACT EXTRACTION PHACO AND INTRAOCULAR               LENS PLACEMENT (IOC)-RIGHT;  Surgeon: Galen Manila,               MD;  Location: [facility];  Service: Ophthalmology;                Laterality: Right;  [place] 00:27.6 CDE 3.43 Fluid Pack Lot               # [id number] 09/27/2018: COLONOSCOPY WITH PROPOFOL; N/A     Comment:  Procedure: COLONOSCOPY WITH PROPOFOL;  Surgeon:               Pasty Spillers, MD;  Location: [facility];                Service: Endoscopy;  Laterality: N/A; No date: DILATION AND CURETTAGE OF UTERUS No date: EYE SURGERY     Comment:  eyelid No date: FRACTURE SURGERY     Comment:  left ankle-plate and screws palced 03/03/2022: GRAFT APPLICATION; Left     Comment:  Procedure: BONE GRAFT;  Surgeon: Edwin Cap, DPM;               Location: [facility];  Service: Podiatry;  Laterality:               Left; 03/03/2022: HARDWARE REMOVAL; Left     Comment:  Procedure: POSSIBLE HARDWARE REMOVAL;  Surgeon:               Edwin Cap, DPM;  Location: [facility];  Service:               Podiatry;  Laterality: Left; No date: LITHOTRIPSY No date: NISSEN FUNDOPLICATION No date: SHOULDER SURGERY; Right     Comment:  rotator cuff No date: TONSILLECTOMY  BMI    Body Mass Index: 32.19 kg/m      Reproductive/Obstetrics negative OB ROS                             Anesthesia Physical Anesthesia Plan  ASA: 3  Anesthesia Plan: General ETT   Post-op Pain Management:    Induction: Intravenous  PONV Risk Score and Plan: Ondansetron, Dexamethasone, Midazolam and Treatment may vary due to age or medical condition  Airway Management Planned: Oral ETT  Additional Equipment:   Intra-op Plan:   Post-operative Plan: Extubation in OR  Informed Consent: I have reviewed the patients History and Physical, chart, labs and discussed the procedure including the risks, benefits  and alternatives for the proposed anesthesia with the patient or authorized representative who has indicated his/her understanding and acceptance.     Dental Advisory Given  Plan Discussed with: Anesthesiologist, CRNA and Surgeon  Anesthesia Plan Comments: (Patient consented for risks of anesthesia including but not limited to:  - adverse reactions to medications - damage to eyes, teeth, lips or other oral mucosa - nerve damage due to positioning  - sore throat or hoarseness - Damage to heart, brain, nerves, lungs, other parts of body or loss of life  Patient voiced understanding and assent.)       Anesthesia Quick Evaluation

## 2023-10-22 NOTE — Transfer of Care (Signed)
 Immediate Anesthesia Transfer of Care Note  Patient: Teresa [name]  Procedure(s) Performed: ANTERIOR CERVICAL DECOMPRESSION/DISCECTOMY FUSION (Spine Cervical)  Patient Location: PACU  Anesthesia Type:General  Level of Consciousness: awake, alert , and oriented  Airway & Oxygen Therapy: Patient Spontanous Breathing and Patient connected to face mask oxygen  Post-op Assessment: Report given to RN and Post -op Vital signs reviewed and stable  Post vital signs: Reviewed and stable  Last Vitals:  Vitals Value Taken Time  BP 135/68 10/22/23 1402  Temp 36.4 C 10/22/23 1403  Pulse 80 10/22/23 1404  Resp 13 10/22/23 1404  SpO2 98 % 10/22/23 1404  Vitals shown include unfiled device data.  Last Pain:  Vitals:   10/22/23 1054  TempSrc: Temporal  PainSc: 7       Patients Stated Pain Goal: 5 (10/22/23 1054)  Complications: No notable events documented.

## 2023-10-22 NOTE — Interval H&P Note (Signed)
 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  The various methods of treatment have been discussed with the patient and family. After consideration of risks, benefits and other options for treatment, the patient has consented to  Procedure(s) with comments: ANTERIOR CERVICAL DECOMPRESSION/DISCECTOMY FUSION (N/A) - C5-6 ANTERIOR CERVICAL DISCECTOMY AND FUSION as a surgical intervention.  The patient's history has been reviewed, patient examined, no change in status, stable for surgery.  I have reviewed the patient's chart and labs.  Questions were answered to the patient's satisfaction.    Heart sounds normal no MRG. Chest Clear to Auscultation Bilaterally.   [name]

## 2023-10-22 NOTE — Anesthesia Procedure Notes (Signed)
 Procedure Name: Intubation Date/Time: 10/22/2023 12:34 PM  Performed by: Morene Crocker, CRNAPre-anesthesia Checklist: Patient identified, Patient being monitored, Timeout performed, Emergency Drugs available and Suction available Patient Re-evaluated:Patient Re-evaluated prior to induction Oxygen Delivery Method: Circle system utilized Preoxygenation: Pre-oxygenation with 100% oxygen Induction Type: IV induction Ventilation: Mask ventilation without difficulty Laryngoscope Size: 3 and McGrath Grade View: Grade I Tube type: Oral Tube size: 7.0 mm Number of attempts: 1 Airway Equipment and Method: Stylet Placement Confirmation: ETT inserted through vocal cords under direct vision, positive ETCO2 and breath sounds checked- equal and bilateral Secured at: 21 cm Tube secured with: Tape Dental Injury: Teeth and Oropharynx as per pre-operative assessment  Comments: Smooth atraumatic intubation, no complications noted.

## 2023-10-23 ENCOUNTER — Encounter: Payer: Self-pay | Admitting: Neurosurgery

## 2023-10-23 LAB — GLUCOSE, CAPILLARY
Glucose-Capillary: 166 mg/dL — ABNORMAL HIGH (ref 70–99)
Glucose-Capillary: 175 mg/dL — ABNORMAL HIGH (ref 70–99)
Glucose-Capillary: 237 mg/dL — ABNORMAL HIGH (ref 70–99)
Glucose-Capillary: 251 mg/dL — ABNORMAL HIGH (ref 70–99)
Glucose-Capillary: 252 mg/dL — ABNORMAL HIGH (ref 70–99)

## 2023-10-23 NOTE — Discharge Summary (Signed)
 error

## 2023-10-23 NOTE — Progress Notes (Signed)
 Patient c/o burning at IV site when administering Toradol. IV flushes well. IV removed per patient request.

## 2023-10-23 NOTE — TOC Progression Note (Signed)
 Transition of Care [facility]) - Progression Note    Patient Details  Name: SAKEENAH VALCARCEL MRN: [id number] Date of Birth: Nov 20, 1945  Transition of Care [facility] CM/SW Contact  Marlowe Sax, RN Phone Number: 10/23/2023, 4:38 PM  Clinical Narrative:     Met with the patient in the room, she is agreeable to a bed search but does not want to go to [facility] or [facility], sent out the bedsearch will review bed offers once they are obtained FL2 completed, PASSR obtained        Expected Discharge Plan and Services                                               Social Determinants of Health (SDOH) Interventions SDOH Screenings   Food Insecurity: No Food Insecurity (10/22/2023)  Housing: Low Risk  (10/22/2023)  Recent Concern: Housing - High Risk (08/21/2023)   [facility] from [facility]  Transportation Needs: No Transportation Needs (10/22/2023)  Utilities: Not At Risk (10/22/2023)  Alcohol Screen: Low Risk  (09/19/2021)  Depression (PHQ2-9): Low Risk  (08/22/2022)  Recent Concern: Depression (PHQ2-9) - Medium Risk (07/21/2022)  Financial Resource Strain: Medium Risk (08/23/2023)   [facility] from [facility]  Physical Activity: Sufficiently Active (09/19/2021)  Social Connections: Moderately Integrated (10/23/2023)  Stress: Stress Concern Present (09/19/2021)  Tobacco Use: Low Risk  (10/22/2023)    Readmission Risk Interventions     No data to display

## 2023-10-23 NOTE — Evaluation (Signed)
 Physical Therapy Evaluation Patient Details Name: Teresa [name] MRN: [id number] DOB: 17-Jul-1946 Today's Date: 10/23/2023  History of Present Illness  Pt is a 78 yo F diagnosed with cervical myelopathy, cervical spinal stenosis, and cervical cord myelomalacia and is s/p C5-6 ACDF.  PMH includes: COPD, HTN, CHF, CAD, CVA, DOE, HTN, and fall history.  Clinical Impression  Pt was pleasant and motivated to participate during the session and put forth good effort throughout. Pt required min extra time and effort and cuing for hand placement during transfer training and presented with min posterior instability upon initial stand.  Pt was able to amb 60 feet with a RW but with noted drifting and occasional mild staggering most notably during sharp turns.  Pt reports a very extensive fall history prior to her current admission and remains at an elevated risk for falls.  Pt will benefit from continued PT services upon discharge to safely address deficits listed in patient problem list for decreased caregiver assistance and eventual return to PLOF.          If plan is discharge home, recommend the following: A little help with walking and/or transfers;A little help with bathing/dressing/bathroom;Assistance with cooking/housework;Assist for transportation;Help with stairs or ramp for entrance   Can travel by private vehicle   Yes    Equipment Recommendations Rolling walker (2 wheels)  Recommendations for Other Services       Functional Status Assessment Patient has had a recent decline in their functional status and demonstrates the ability to make significant improvements in function in a reasonable and predictable amount of time.     Precautions / Restrictions Precautions Precautions: Cervical;Back;Fall Precaution/Restrictions Comments: per pt, she has not been released from spinal precautions post lumbar fusion in November 2024. No cervical brace needed per orders 3/17. Restrictions Weight  Bearing Restrictions Per Provider Order: No      Mobility  Bed Mobility               General bed mobility comments: NT, in recliner pre-post session    Transfers Overall transfer level: Needs assistance Equipment used: Rolling walker (2 wheels) Transfers: Sit to/from Stand Sit to Stand: Min assist           General transfer comment: Some posterior instability on initial stand with min A to correct    Ambulation/Gait Ambulation/Gait assistance: Min assist Gait Distance (Feet): 60 Feet Assistive device: Rolling walker (2 wheels) Gait Pattern/deviations: Step-through pattern, Staggering left, Staggering right, Drifts right/left, Decreased step length - right, Decreased step length - left Gait velocity: decreased     General Gait Details: Mild staggering and drifting left/right during gait with occasional min A for stability and cues for staying inside the RW during sharp turns in tight spaces  Stairs            Wheelchair Mobility     Tilt Bed    Modified Rankin (Stroke Patients Only)       Balance Overall balance assessment: History of Falls, Needs assistance   Sitting balance-Leahy Scale: Good     Standing balance support: Bilateral upper extremity supported, During functional activity, Reliant on assistive device for balance Standing balance-Leahy Scale: Poor                               Pertinent Vitals/Pain Pain Assessment Pain Assessment: 0-10 Pain Score: 7  Pain Location: Surgical incision Pain Descriptors / Indicators: Sore Pain Intervention(s): Premedicated before  session, Monitored during session    Home Living Family/patient expects to be discharged to:: Private residence Living Arrangements: Alone Available Help at Discharge: Family;Friend(s);Available PRN/intermittently Type of Home: House Home Access: Stairs to enter Entrance Stairs-Rails: None Entrance Stairs-Number of Steps: 1+1 Alternate Level Stairs-Number  of Steps: 13 Home Layout: Two level;Bed/bath upstairs;1/2 bath on main level Home Equipment: Rollator (4 wheels);Cane - single point;BSC/3in1 Additional Comments: BSC on 1st floor 1/2 bath    Prior Function Prior Level of Function : Independent/Modified Independent;History of Falls (last six months)             Mobility Comments: Furniture cruises in the home secondary to no space for AD, frequent falls almost daily ADLs Comments: Ind with bathroom use and showers, but endorses some difficulty getting in/out of tub/shower     Extremity/Trunk Assessment   Upper Extremity Assessment Upper Extremity Assessment: Defer to OT evaluation    Lower Extremity Assessment Lower Extremity Assessment: Generalized weakness    Cervical / Trunk Assessment Cervical / Trunk Assessment: Neck Surgery  Communication   Communication Communication: No apparent difficulties    Cognition Arousal: Alert Behavior During Therapy: WFL for tasks assessed/performed                             Following commands: Intact       Cueing       General Comments      Exercises     Assessment/Plan    PT Assessment Patient needs continued PT services  PT Problem List Decreased strength;Decreased activity tolerance;Decreased balance;Decreased mobility;Decreased knowledge of use of DME;Decreased knowledge of precautions;Pain       PT Treatment Interventions DME instruction;Gait training;Stair training;Functional mobility training;Therapeutic activities;Therapeutic exercise;Balance training;Neuromuscular re-education;Patient/family education    PT Goals (Current goals can be found in the Care Plan section)  Acute Rehab PT Goals Patient Stated Goal: Improved balance for decreased falls PT Goal Formulation: With patient Time For Goal Achievement: 11/05/23 Potential to Achieve Goals: Good    Frequency 7X/week     Co-evaluation               AM-PAC PT "6 Clicks" Mobility   Outcome Measure Help needed turning from your back to your side while in a flat bed without using bedrails?: A Little Help needed moving from lying on your back to sitting on the side of a flat bed without using bedrails?: A Little Help needed moving to and from a bed to a chair (including a wheelchair)?: A Little Help needed standing up from a chair using your arms (e.g., wheelchair or bedside chair)?: A Little Help needed to walk in hospital room?: A Little Help needed climbing 3-5 steps with a railing? : A Lot 6 Click Score: 17    End of Session Equipment Utilized During Treatment: Gait belt Activity Tolerance: Patient tolerated treatment well Patient left: in chair;with call bell/phone within reach;with chair alarm set;with SCD's reapplied;with family/visitor present Nurse Communication: Mobility status PT Visit Diagnosis: History of falling (Z91.81);Unsteadiness on feet (R26.81);Difficulty in walking, not elsewhere classified (R26.2);Muscle weakness (generalized) (M62.81);Pain Pain - part of body:  (surgical incision)    Time: 2725-3664 PT Time Calculation (min) (ACUTE ONLY): 24 min   Charges:   PT Evaluation $PT Eval Moderate Complexity: 1 Mod   PT General Charges $$ ACUTE PT VISIT: 1 Visit       D. Scott Brody Bonneau PT, DPT 10/23/23, 11:55 AM

## 2023-10-23 NOTE — Plan of Care (Signed)

## 2023-10-23 NOTE — Anesthesia Postprocedure Evaluation (Signed)
 Anesthesia Post Note  Patient: Teresa [name]  Procedure(s) Performed: ANTERIOR CERVICAL DECOMPRESSION/DISCECTOMY FUSION (Spine Cervical)  Patient location during evaluation: PACU Anesthesia Type: General Level of consciousness: awake and alert Pain management: pain level controlled Vital Signs Assessment: post-procedure vital signs reviewed and stable Respiratory status: spontaneous breathing, nonlabored ventilation, respiratory function stable and patient connected to nasal cannula oxygen Cardiovascular status: blood pressure returned to baseline and stable Postop Assessment: no apparent nausea or vomiting Anesthetic complications: no   No notable events documented.   Last Vitals:  Vitals:   10/23/23 0708 10/23/23 0829  BP: 114/77   Pulse: 73   Resp: 17   Temp: (!) 36.4 C 36.6 C  SpO2: 95%     Last Pain:  Vitals:   10/23/23 0829  TempSrc: Oral  PainSc:                  Cleda Mccreedy Mckensi Redinger

## 2023-10-23 NOTE — Inpatient Diabetes Management (Signed)
 Inpatient Diabetes Program Recommendations  AACE/ADA: New Consensus Statement on Inpatient Glycemic Control (2015)  Target Ranges:  Prepandial:   less than 140 mg/dL      Peak postprandial:   less than 180 mg/dL (1-2 hours)      Critically ill patients:  140 - 180 mg/dL   Lab Results  Component Value Date   GLUCAP 251 (H) 10/23/2023   HGBA1C 7.5 (H) 06/18/2023    Review of Glycemic Control  Latest Reference Range & Units 10/22/23 14:08 10/22/23 21:11 10/23/23 00:17 10/23/23 07:42  Glucose-Capillary 70 - 99 mg/dL 161 (H) 096 (H) 045 (H) 251 (H)   Diabetes history: DM 2 Outpatient Diabetes medications:  FSL3 Jardiance 25 mg daily Glucotrol 2.5 mg daily Metformin 1000 mg bid Ozempic 2 mg q Sunday Actos 15 mg daily Current orders for Inpatient glycemic control:  Novolog 0-15 units tid with meals and HS Glucotrol 2.5 mg daily Metformin 1000 mg bid Actos 15 mg daily  Inpatient Diabetes Program Recommendations:    Note that patient received Decadron 10 mg x 1 yesterday prior to surgery. Consider adding Lantus 15 units daily while patient is in the hospital.    Thanks,  Lorenza Cambridge, RN, BC-ADM Inpatient Diabetes Coordinator Pager [phone]  (8a-5p)

## 2023-10-23 NOTE — [place] FL2 (Signed)
 [place] MEDICAID FL2 LEVEL OF CARE FORM     IDENTIFICATION  Patient Name: Teresa [name] Birthdate: 1946-07-29 Sex: female Admission Date (Current Location): 10/22/2023  [place] and [place] Number:  Chiropodist and Address:  [facility], [address] [place], [place] [address]      Provider Number: [id number]  Attending Physician Name and Address:  Venetia Night, MD  Relative Name and Phone Number:  [name].  Son  Emergency Contact  [phone]  [email or web address]  [address]  [place] [address]    Current Level of Care: Hospital Recommended Level of Care: Skilled Nursing Facility Prior Approval Number:    Date Approved/Denied:   PASRR Number: [id number]  Discharge Plan: SNF    Current Diagnoses: Patient Active Problem List   Diagnosis Date Noted   Cervical myelopathy (HCC) 10/22/2023   Myelomalacia (HCC) 10/22/2023   Spinal stenosis in cervical region 10/22/2023   S/P cervical spinal fusion 10/22/2023   Acute gastroenteritis 07/15/2023   Increased anion gap metabolic acidosis 07/15/2023   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 06/18/2023   S/P lumbar fusion 06/18/2023   Dysautonomia (HCC) 09/01/2022   Memory deficit 07/12/2022   Major osseous defect    Post-traumatic arthritis of left ankle    Pain due to internal orthopedic prosthetic devices, implants and grafts, initial encounter (HCC)    Status post ankle arthrodesis 03/03/2022   Osteopenia of multiple sites 12/12/2021   Seizure-like activity (HCC) 09/27/2021   Atrial fibrillation (HCC) 08/09/2021   Hypokalemia 06/13/2021   Chronic diastolic CHF (congestive heart failure) (HCC) 05/16/2019   Benign neoplasm of cecum    Diverticulosis of large intestine without diverticulitis    Polyneuropathy associated with underlying disease (HCC) 12/11/2016   Microcalcifications of the breast 12/15/2015   Back pain,  chronic 06/28/2015   Chronic airway obstruction (HCC) 06/28/2015   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   Essential hypertension 06/21/2009   CAD (coronary artery disease) 06/21/2009   DYSPHAGIA 06/21/2009   RUQ PAIN 06/21/2009   HIATAL HERNIA 01/07/2008   Hypothyroidism 11/22/2007   Anxiety 11/22/2007   DEPRESSION 11/22/2007   Hypertensive heart disease without heart failure 11/22/2007   ANGINA PECTORIS 11/22/2007   RENAL CALCULUS 11/22/2007   Osteoarthritis 11/22/2007   Sleep apnea 11/22/2007   GASTRITIS 10/23/2007   DIVERTICULOSIS, COLON 10/23/2007   Type 2 diabetes mellitus with diabetic neuropathy, without long-term current use of insulin (HCC) 07/16/2007   HYPERCHOLESTEROLEMIA 07/16/2007   OBESITY, MODERATE 07/16/2007   ISCHEMIC HEART DISEASE 07/16/2007   ESOPHAGEAL REFLUX 07/16/2007   SCLERODERMA 07/16/2007    Orientation RESPIRATION BLADDER Height & Weight     Self, Time, Situation, Place  Normal Continent, External catheter Weight: 79.8 kg Height:  5\' 2"  (157.5 cm)  BEHAVIORAL SYMPTOMS/MOOD NEUROLOGICAL BOWEL NUTRITION STATUS      Continent Diet (carb modified)  AMBULATORY STATUS COMMUNICATION OF NEEDS Skin   Extensive Assist Verbally Normal, Surgical wounds                       Personal Care Assistance Level of Assistance  Bathing, Feeding, Dressing Bathing Assistance: Limited assistance Feeding assistance: Limited assistance Dressing Assistance: Maximum assistance     Functional Limitations Info  Sight, Hearing, Speech Sight Info: Adequate (glasses) Hearing Info: Adequate Speech Info: Adequate    SPECIAL CARE FACTORS FREQUENCY  PT (By licensed PT), OT (By licensed OT)     PT Frequency: 5  times per week OT Frequency: 5 times per week            Contractures Contractures Info: Not present    Additional Factors Info  Code Status, Allergies Code Status Info: full code Allergies Info: Clarithromycin, Codeine, Dilaudid  (Hydromorphone  Hcl), Iodine, Iohexol, Onion, Povidone-iodine, Tamiflu  (Oseltamivir Phosphate), Zolpidem, Bacitracin-neomycin-polymyxin, Benzalkonium Chloride, Lidocaine Hcl, Morphine, Neomycin-bacitracin Zn-polymyx, Tape, Tapentadol           Current Medications (10/23/2023):  This is the current hospital active medication list Current Facility-Administered Medications  Medication Dose Route Frequency Provider Last Rate Last Admin   acetaminophen (TYLENOL) tablet 650 mg  650 mg Oral Q4H PRN Susanne Borders, PA       Or   acetaminophen (TYLENOL) suppository 650 mg  650 mg Rectal Q4H PRN Susanne Borders, PA       acetaminophen (TYLENOL) tablet 1,000 mg  1,000 mg Oral Q6H Susanne Borders, PA   1,000 mg at 10/23/23 1300   albuterol (PROVENTIL) (2.5 MG/3ML) 0.083% nebulizer solution 2.5 mg  2.5 mg Inhalation Q6H PRN Susanne Borders, PA       allopurinol (ZYLOPRIM) tablet 100 mg  100 mg Oral QHS Susanne Borders, PA   100 mg at 10/22/23 2140   atorvastatin (LIPITOR) tablet 10 mg  10 mg Oral Daily Susanne Borders, PA   10 mg at 10/23/23 7829   docusate sodium (COLACE) capsule 100 mg  100 mg Oral BID Susanne Borders, PA   100 mg at 10/23/23 0934   enoxaparin (LOVENOX) injection 40 mg  40 mg Subcutaneous Q24H Susanne Borders, PA   40 mg at 10/23/23 0756   fluticasone furoate-vilanterol (BREO ELLIPTA) 200-25 MCG/ACT 1 puff  1 puff Inhalation Daily Susanne Borders, PA   1 puff at 10/23/23 5621   And   umeclidinium bromide (INCRUSE ELLIPTA) 62.5 MCG/ACT 1 puff  1 puff Inhalation Daily Susanne Borders, PA   1 puff at 10/23/23 0750   furosemide (LASIX) tablet 40 mg  40 mg Oral Daily Susanne Borders, [place]   40 mg at 10/23/23 3086   gabapentin (NEURONTIN) capsule 800 mg  800 mg Oral TID Susanne Borders, PA   800 mg at 10/23/23 5784   glipiZIDE (GLUCOTROL) tablet 2.5 mg  2.5 mg Oral QAC breakfast Susanne Borders, PA   2.5 mg at 10/23/23 6962   insulin aspart (novoLOG) injection 0-15 Units   0-15 Units Subcutaneous TID WC Susanne Borders, [place]   3 Units at 10/23/23 1301   insulin aspart (novoLOG) injection 0-5 Units  0-5 Units Subcutaneous QHS Susanne Borders, [place]   5 Units at 10/22/23 2129   levothyroxine (SYNTHROID) tablet 88 mcg  88 mcg Oral Daily Susanne Borders, [place]   88 mcg at 10/23/23 0547   magnesium citrate solution 1 Bottle  1 Bottle Oral Once PRN Susanne Borders, PA       menthol-cetylpyridinium (CEPACOL) lozenge 3 mg  1 lozenge Oral PRN Susanne Borders, PA   3 mg at 10/23/23 1307   Or   phenol (CHLORASEPTIC) mouth spray 1 spray  1 spray Mouth/Throat PRN Susanne Borders, PA       metFORMIN (GLUCOPHAGE) tablet 1,000 mg  1,000 mg Oral BID WC Susanne Borders, PA   1,000 mg at 10/23/23 0740   methocarbamol (ROBAXIN) tablet 500 mg  500 mg Oral Q6H PRN Susanne Borders, PA   500 mg  at 10/22/23 1506   Or   methocarbamol (ROBAXIN) injection 500 mg  500 mg Intravenous Q6H PRN Susanne Borders, PA       ondansetron [facility]) tablet 4 mg  4 mg Oral Q6H PRN Susanne Borders, PA       Or   ondansetron [facility]) injection 4 mg  4 mg Intravenous Q6H PRN Susanne Borders, PA       oxyCODONE (Oxy IR/ROXICODONE) immediate release tablet 10 mg  10 mg Oral Q4H PRN Susanne Borders, PA   10 mg at 10/23/23 0865   oxyCODONE (Oxy IR/ROXICODONE) immediate release tablet 5 mg  5 mg Oral Q4H PRN Susanne Borders, PA       pioglitazone (ACTOS) tablet 15 mg  15 mg Oral q morning Susanne Borders, PA   15 mg at 10/23/23 7846   polyethylene glycol (MIRALAX / GLYCOLAX) packet 17 g  17 g Oral Daily PRN Susanne Borders, PA       potassium chloride (KLOR-CON M) CR tablet 10 mEq  10 mEq Oral Daily Susanne Borders, [place]   10 mEq at 10/23/23 9629   senna (SENOKOT) tablet 8.6 mg  1 tablet Oral BID Susanne Borders, PA   8.6 mg at 10/23/23 0935   sodium chloride flush (NS) 0.9 % injection 3 mL  3 mL Intravenous Q12H Susanne Borders, PA   3 mL at 10/23/23 0935   sodium  chloride flush (NS) 0.9 % injection 3 mL  3 mL Intravenous PRN Susanne Borders, PA   3 mL at 10/22/23 2131   sorbitol 70 % solution 30 mL  30 mL Oral Daily PRN Susanne Borders, PA         Discharge Medications: Please see discharge summary for a list of discharge medications.  Relevant Imaging Results:  Relevant Lab Results:   Additional Information ss#[id number]  Marlowe Sax, RN

## 2023-10-23 NOTE — Evaluation (Signed)
 Occupational Therapy Evaluation Patient Details Name: Teresa [name] MRN: [id number] DOB: 1946-08-06 Today's Date: 10/23/2023   History of Present Illness   Pt is a 78 y.o. female s/p ACDF C5-6 on 10/22/23. PMH significant for lumbar fusion 2024, paroxysmal AFIB, CAD, CVA/TIA, HTN, HLD, DM, seizures, COPD, chronic LBP, cervical stenosis, depression, anxiety, memory loss, R rotator cuff repair, L ankle sx     Clinical Impressions Prior to hospital admission, pt lives alone was independent in ADL/IADL performance including driving. Pt endorses extensive falls hx, and is unable to use AD inside home due to limited space. Pt will occasionally use RW or rollator for community distances. Pt currently requires minA for functional transfers with posterior instability, completes functional mobility using RW with CGA/minA t/f bathroom and lap in hallway, and performs toileting tasks with minA.  Mild staggering and instability noted with directional changes. Pt with 3x LOB during session, requiring up to modA to correct. Provided pt with cervical precaution handout for ADL performance. Pt is a high falls risk, and is unsafe to discharge home alone at this time without 24/7 supervision and assist. Pt would benefit from skilled OT services to address noted impairments and functional limitations (see below for any additional details) in order to maximize safety and independence while minimizing falls risk and caregiver burden. Anticipate the need for follow up OT services upon acute hospital DC.     If plan is discharge home, recommend the following:   A little help with walking and/or transfers;A little help with bathing/dressing/bathroom;Assistance with cooking/housework;Help with stairs or ramp for entrance;Assist for transportation     Functional Status Assessment   Patient has had a recent decline in their functional status and demonstrates the ability to make significant improvements in function in a  reasonable and predictable amount of time.     Equipment Recommendations   BSC/3in1;Tub/shower bench;Toilet riser;Toilet rise with handles      Precautions/Restrictions   Precautions Precautions: Cervical;Back;Fall Precaution/Restrictions Comments: per pt, she has not been released from spinal precautions post lumbar fusion in November 2024. No cervical brace needed per orders 3/17. Restrictions Weight Bearing Restrictions Per Provider Order: No     Mobility Bed Mobility               General bed mobility comments: NT , in recliner pre and post session    Transfers Overall transfer level: Needs assistance Equipment used: Rolling walker (2 wheels), None Transfers: Sit to/from Stand Sit to Stand: Min assist           General transfer comment: attempted first sit <>stand transfer from recliner without AD to simulate home setup, pt generally insteady with posterior bias. Second attempt with use of RW, pt stands quickly and immediately has posterior LOB once upright, poor righting reactions and requires modA from OT to control descent down to recliner. Pt also with significant difficulties performing commode transfers to simulate home setup      Balance Overall balance assessment: History of Falls, Needs assistance Sitting-balance support: Single extremity supported, Feet supported Sitting balance-Leahy Scale: Good   Postural control: Posterior lean Standing balance support: Bilateral upper extremity supported, During functional activity, Reliant on assistive device for balance Standing balance-Leahy Scale: Poor Standing balance comment: poor righting reactions, pt stands quickly with posterior LOB and unable to self-correct                           ADL either performed or assessed with  clinical judgement   ADL Overall ADL's : Needs assistance/impaired                         Toilet Transfer: Minimal assistance;Ambulation;Regular  Toilet;Rolling walker (2 wheels);Cueing for safety;Cueing for sequencing Toilet Transfer Details (indicate cue type and reason): cues for technique and spinal precautions, pt requires minA to rise from regular heigh toilet (simulating home setup), cannot achieve without physical assist while maintaining precautions Toileting- Clothing Manipulation and Hygiene: Minimal assistance;Sit to/from stand       Functional mobility during ADLs: Rolling walker (2 wheels);Cueing for sequencing;Cueing for safety;Minimal assistance;Moderate assistance General ADL Comments: T/f bathroom using RW after therapist providing education on AD use with pt's current balance deficits and falls hx.     Vision Baseline Vision/History: 1 Wears glasses              Pertinent Vitals/Pain Pain Assessment Pain Assessment: 0-10 Pain Score: 5  Pain Location: HA Pain Descriptors / Indicators: Headache Pain Intervention(s): Limited activity within patient's tolerance, Monitored during session     Extremity/Trunk Assessment Upper Extremity Assessment Upper Extremity Assessment: Generalized weakness   Lower Extremity Assessment Lower Extremity Assessment: Generalized weakness   Cervical / Trunk Assessment Cervical / Trunk Assessment: Neck Surgery   Communication Communication Communication: No apparent difficulties   Cognition Arousal: Alert Behavior During Therapy: WFL for tasks assessed/performed               OT - Cognition Comments: poor insight into deficits, tends to be impulsive at times which increases risk of falls                 Following commands: Intact       Cueing  General Comments   Cueing Techniques: Verbal cues  Anterior inscision dressing intact and dry           Home Living Family/patient expects to be discharged to:: Private residence Living Arrangements: Alone Available Help at Discharge: Family;Friend(s);Available PRN/intermittently Type of Home:  House Home Access: Stairs to enter Entrance Stairs-Number of Steps: 1+1 Entrance Stairs-Rails: None Home Layout: Two level;Bed/bath upstairs;1/2 bath on main level Alternate Level Stairs-Number of Steps: 13 Alternate Level Stairs-Rails: Right Bathroom Shower/Tub: Chief Strategy Officer: Standard Bathroom Accessibility: Yes   Home Equipment: Rollator (4 wheels);Cane - single point;BSC/3in1   Additional Comments: BSC on 1st floor 1/2 bath      Prior Functioning/Environment Prior Level of Function : Independent/Modified Independent;History of Falls (last six months);Driving             Mobility Comments: Furniture cruises in the home secondary to no space for AD, frequent falls almost daily ADLs Comments: Ind with bathroom use and showers, but endorses some difficulty getting in/out of tub/shower    OT Problem List: Decreased strength;Decreased range of motion;Impaired balance (sitting and/or standing);Decreased safety awareness;Decreased knowledge of use of DME or AE;Decreased knowledge of precautions   OT Treatment/Interventions: Self-care/ADL training;Neuromuscular education;Energy conservation;DME and/or AE instruction;Therapeutic activities;Patient/family education;Balance training      OT Goals(Current goals can be found in the care plan section)   Acute Rehab OT Goals OT Goal Formulation: With patient Time For Goal Achievement: 11/06/23 Potential to Achieve Goals: Good   OT Frequency:  Min 2X/week       AM-PAC OT "6 Clicks" Daily Activity     Outcome Measure Help from another person eating meals?: None Help from another person taking care of personal grooming?: None Help from another  person toileting, which includes using toliet, bedpan, or urinal?: A Little Help from another person bathing (including washing, rinsing, drying)?: A Little Help from another person to put on and taking off regular upper body clothing?: A Little Help from another person  to put on and taking off regular lower body clothing?: A Little 6 Click Score: 20   End of Session Equipment Utilized During Treatment: Gait belt;Rolling walker (2 wheels) Nurse Communication: Mobility status  Activity Tolerance: Patient tolerated treatment well Patient left: in chair;with call bell/phone within reach;with chair alarm set;with family/visitor present;with nursing/sitter in room  OT Visit Diagnosis: History of falling (Z91.81);Repeated falls (R29.6);Unsteadiness on feet (R26.81);Muscle weakness (generalized) (M62.81)                Time: 2952-8413 OT Time Calculation (min): 42 min Charges:  OT General Charges $OT Visit: 1 Visit OT Evaluation $OT Eval Moderate Complexity: 1 Mod OT Treatments $Self Care/Home Management : 38-52 mins  Shatara Stanek L. Alexzia Kasler, OTR/L  10/23/23, 1:18 PM

## 2023-10-23 NOTE — Progress Notes (Addendum)
   Neurosurgery Progress Note  History: Teresa [name] iss/p C5-6 ACDF   POD2: Feeling light headed this morning despite normal BP.  POD1: pt doing well this morning. Reporting improved numbness in hands.   Physical Exam: Vitals:   10/23/23 0708 10/23/23 0829  BP: 114/77   Pulse: 73   Resp: 17   Temp: (!) 97.5 F (36.4 C) 97.8 F (36.6 C)  SpO2: 95%     AA Ox3 CNI  Strength:5/5 throughout BLE and BUE except 4/5 HG and IO.  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  - DVT prophylaxis - PTOT; dispo planning pending recommendations. Recommending SNF. SW involved. We appreciate your assistance.    Manning Charity PA-C Department of Neurosurgery

## 2023-10-24 LAB — GLUCOSE, CAPILLARY
Glucose-Capillary: 200 mg/dL — ABNORMAL HIGH (ref 70–99)
Glucose-Capillary: 281 mg/dL — ABNORMAL HIGH (ref 70–99)
Glucose-Capillary: 177 mg/dL — ABNORMAL HIGH (ref 70–99)
Glucose-Capillary: 250 mg/dL — ABNORMAL HIGH (ref 70–99)

## 2023-10-24 NOTE — TOC Progression Note (Signed)
 Transition of Care [facility]) - Progression Note    Patient Details  Name: Teresa [name] MRN: [id number] Date of Birth: Apr 22, 1946  Transition of Care [facility]) CM/SW Contact  Marlowe Sax, RN Phone Number: 10/24/2023, 1:31 PM  Clinical Narrative:     According to Phineas Semen the patient has to open workmans comp claims that will have to be closed prior to anyone being able to accept her for STR,        Expected Discharge Plan and Services                                               Social Determinants of Health (SDOH) Interventions SDOH Screenings   Food Insecurity: No Food Insecurity (10/22/2023)  Housing: Low Risk  (10/22/2023)  Recent Concern: Housing - High Risk (08/21/2023)   Received from [facility]  Transportation Needs: No Transportation Needs (10/22/2023)  Utilities: Not At Risk (10/22/2023)  Alcohol Screen: Low Risk  (09/19/2021)  Depression (PHQ2-9): Low Risk  (08/22/2022)  Recent Concern: Depression (PHQ2-9) - Medium Risk (07/21/2022)  Financial Resource Strain: Medium Risk (08/23/2023)   Received from [facility]  Physical Activity: Sufficiently Active (09/19/2021)  Social Connections: Moderately Integrated (10/23/2023)  Stress: Stress Concern Present (09/19/2021)  Tobacco Use: Low Risk  (10/22/2023)    Readmission Risk Interventions     No data to display

## 2023-10-24 NOTE — Plan of Care (Signed)
 Problem: Education: Goal: Knowledge of General Education information will improve Description: Including pain rating scale, medication(s)/side effects and non-pharmacologic comfort measures 10/24/2023 0618 by Bunnie Domino, RN Outcome: Progressing 10/24/2023 0617 by Bunnie Domino, RN Outcome: Progressing   Problem: Health Behavior/Discharge Planning: Goal: Ability to manage health-related needs will improve 10/24/2023 0618 by Bunnie Domino, RN Outcome: Progressing 10/24/2023 0617 by Bunnie Domino, RN Outcome: Progressing   Problem: Clinical Measurements: Goal: Ability to maintain clinical measurements within normal limits will improve 10/24/2023 0618 by Bunnie Domino, RN Outcome: Progressing 10/24/2023 0617 by Bunnie Domino, RN Outcome: Progressing Goal: Will remain free from infection 10/24/2023 0618 by Bunnie Domino, RN Outcome: Progressing 10/24/2023 0617 by Bunnie Domino, RN Outcome: Progressing Goal: Diagnostic test results will improve 10/24/2023 0618 by Bunnie Domino, RN Outcome: Progressing 10/24/2023 0617 by Bunnie Domino, RN Outcome: Progressing Goal: Respiratory complications will improve 10/24/2023 0618 by Bunnie Domino, RN Outcome: Progressing 10/24/2023 0617 by Bunnie Domino, RN Outcome: Progressing Goal: Cardiovascular complication will be avoided 10/24/2023 0618 by Bunnie Domino, RN Outcome: Progressing 10/24/2023 0617 by Bunnie Domino, RN Outcome: Progressing   Problem: Activity: Goal: Risk for activity intolerance will decrease 10/24/2023 0618 by Bunnie Domino, RN Outcome: Progressing 10/24/2023 0617 by Bunnie Domino, RN Outcome: Progressing   Problem: Nutrition: Goal: Adequate nutrition will be maintained 10/24/2023 0618 by Bunnie Domino, RN Outcome: Progressing 10/24/2023 0617 by Bunnie Domino, RN Outcome: Progressing   Problem: Coping: Goal: Level of anxiety will  decrease 10/24/2023 0618 by Bunnie Domino, RN Outcome: Progressing 10/24/2023 0617 by Bunnie Domino, RN Outcome: Progressing   Problem: Elimination: Goal: Will not experience complications related to bowel motility 10/24/2023 0618 by Bunnie Domino, RN Outcome: Progressing 10/24/2023 0617 by Bunnie Domino, RN Outcome: Progressing Goal: Will not experience complications related to urinary retention 10/24/2023 0618 by Bunnie Domino, RN Outcome: Progressing 10/24/2023 0617 by Bunnie Domino, RN Outcome: Progressing   Problem: Safety: Goal: Ability to remain free from injury will improve 10/24/2023 0618 by Bunnie Domino, RN Outcome: Progressing 10/24/2023 0617 by Bunnie Domino, RN Outcome: Progressing   Problem: Skin Integrity: Goal: Risk for impaired skin integrity will decrease 10/24/2023 0618 by Bunnie Domino, RN Outcome: Progressing 10/24/2023 0617 by Bunnie Domino, RN Outcome: Progressing   Problem: Education: Goal: Ability to describe self-care measures that may prevent or decrease complications (Diabetes Survival Skills Education) will improve 10/24/2023 0618 by Bunnie Domino, RN Outcome: Progressing 10/24/2023 0617 by Bunnie Domino, RN Outcome: Progressing Goal: Individualized Educational Video(s) 10/24/2023 0618 by Bunnie Domino, RN Outcome: Progressing 10/24/2023 0617 by Bunnie Domino, RN Outcome: Progressing   Problem: Coping: Goal: Ability to adjust to condition or change in health will improve 10/24/2023 0618 by Bunnie Domino, RN Outcome: Progressing 10/24/2023 0617 by Bunnie Domino, RN Outcome: Progressing   Problem: Fluid Volume: Goal: Ability to maintain a balanced intake and output will improve 10/24/2023 0618 by Bunnie Domino, RN Outcome: Progressing 10/24/2023 0617 by Bunnie Domino, RN Outcome: Progressing   Problem: Health Behavior/Discharge Planning: Goal: Ability to  identify and utilize available resources and services will improve 10/24/2023 0618 by Bunnie Domino, RN Outcome: Progressing 10/24/2023 0617 by Bunnie Domino, RN Outcome: Progressing Goal: Ability to manage health-related needs will improve 10/24/2023 0618 by Bunnie Domino, RN Outcome: Progressing 10/24/2023 0617 by Bunnie Domino, RN Outcome: Progressing  Problem: Metabolic: Goal: Ability to maintain appropriate glucose levels will improve 10/24/2023 0618 by Bunnie Domino, RN Outcome: Progressing 10/24/2023 0617 by Bunnie Domino, RN Outcome: Progressing   Problem: Nutritional: Goal: Maintenance of adequate nutrition will improve 10/24/2023 0618 by Bunnie Domino, RN Outcome: Progressing 10/24/2023 0617 by Bunnie Domino, RN Outcome: Progressing Goal: Progress toward achieving an optimal weight will improve 10/24/2023 0618 by Bunnie Domino, RN Outcome: Progressing 10/24/2023 0617 by Bunnie Domino, RN Outcome: Progressing   Problem: Skin Integrity: Goal: Risk for impaired skin integrity will decrease 10/24/2023 0618 by Bunnie Domino, RN Outcome: Progressing 10/24/2023 0617 by Bunnie Domino, RN Outcome: Progressing   Problem: Tissue Perfusion: Goal: Adequacy of tissue perfusion will improve 10/24/2023 0618 by Bunnie Domino, RN Outcome: Progressing 10/24/2023 0617 by Bunnie Domino, RN Outcome: Progressing   Problem: Education: Goal: Ability to verbalize activity precautions or restrictions will improve 10/24/2023 0618 by Bunnie Domino, RN Outcome: Progressing 10/24/2023 0617 by Bunnie Domino, RN Outcome: Progressing Goal: Knowledge of the prescribed therapeutic regimen will improve 10/24/2023 0618 by Bunnie Domino, RN Outcome: Progressing 10/24/2023 0617 by Bunnie Domino, RN Outcome: Progressing Goal: Understanding of discharge needs will improve 10/24/2023 0618 by Bunnie Domino,  RN Outcome: Progressing 10/24/2023 0617 by Bunnie Domino, RN Outcome: Progressing   Problem: Activity: Goal: Ability to avoid complications of mobility impairment will improve 10/24/2023 0618 by Bunnie Domino, RN Outcome: Progressing 10/24/2023 0617 by Bunnie Domino, RN Outcome: Progressing Goal: Ability to tolerate increased activity will improve 10/24/2023 0618 by Bunnie Domino, RN Outcome: Progressing 10/24/2023 0617 by Bunnie Domino, RN Outcome: Progressing Goal: Will remain free from falls 10/24/2023 0618 by Bunnie Domino, RN Outcome: Progressing 10/24/2023 0617 by Bunnie Domino, RN Outcome: Progressing   Problem: Bowel/Gastric: Goal: Gastrointestinal status for postoperative course will improve 10/24/2023 0618 by Bunnie Domino, RN Outcome: Progressing 10/24/2023 0617 by Bunnie Domino, RN Outcome: Progressing   Problem: Clinical Measurements: Goal: Ability to maintain clinical measurements within normal limits will improve 10/24/2023 0618 by Bunnie Domino, RN Outcome: Progressing 10/24/2023 0617 by Bunnie Domino, RN Outcome: Progressing Goal: Postoperative complications will be avoided or minimized 10/24/2023 0618 by Bunnie Domino, RN Outcome: Progressing 10/24/2023 0617 by Bunnie Domino, RN Outcome: Progressing Goal: Diagnostic test results will improve 10/24/2023 0618 by Bunnie Domino, RN Outcome: Progressing 10/24/2023 0617 by Bunnie Domino, RN Outcome: Progressing   Problem: Pain Management: Goal: Pain level will decrease 10/24/2023 0618 by Bunnie Domino, RN Outcome: Progressing 10/24/2023 0617 by Bunnie Domino, RN Outcome: Progressing   Problem: Skin Integrity: Goal: Will show signs of wound healing 10/24/2023 0618 by Bunnie Domino, RN Outcome: Progressing 10/24/2023 0617 by Bunnie Domino, RN Outcome: Progressing   Problem: Health Behavior/Discharge Planning: Goal:  Identification of resources available to assist in meeting health care needs will improve 10/24/2023 0618 by Bunnie Domino, RN Outcome: Progressing 10/24/2023 0617 by Bunnie Domino, RN Outcome: Progressing   Problem: Bladder/Genitourinary: Goal: Urinary functional status for postoperative course will improve 10/24/2023 0618 by Bunnie Domino, RN Outcome: Progressing 10/24/2023 0617 by Bunnie Domino, RN Outcome: Progressing

## 2023-10-24 NOTE — Plan of Care (Signed)
  Problem: Clinical Measurements: Goal: Will remain free from infection Outcome: Progressing   Problem: Nutrition: Goal: Adequate nutrition will be maintained Outcome: Progressing   Problem: Elimination: Goal: Will not experience complications related to bowel motility Outcome: Progressing

## 2023-10-24 NOTE — Progress Notes (Signed)
 Physical Therapy Treatment Patient Details Name: Teresa [name] MRN: [id number] DOB: 12-Jul-1946 Today's Date: 10/24/2023   History of Present Illness Pt is a 78 y.o. female s/p ACDF C5-6 on 10/22/23. PMH significant for lumbar fusion 2024, paroxysmal AFIB, CAD, CVA/TIA, HTN, HLD, DM, seizures, COPD, chronic LBP, cervical stenosis, depression, anxiety, memory loss, R rotator cuff repair, L ankle sx    PT Comments  Pt was pleasant and motivated to participate during the session and put forth good effort throughout. Pt required close CGA with transfers with some mild sway upon coming to standing but able to self-correct.  During ambulation pt presented with mild drifting left/right during with occasional min A for stability; pt reported dizziness/lightheadedness after amb around 30 feet requiring a chair to be brought to the hall and with BP taken in sitting at 130/68 with HR 88, neuro PA present and aware and nursing notified. Pt will benefit from continued PT services upon discharge to safely address deficits listed in patient problem list for decreased caregiver assistance and eventual return to PLOF.      If plan is discharge home, recommend the following: A little help with walking and/or transfers;A little help with bathing/dressing/bathroom;Assistance with cooking/housework;Assist for transportation;Help with stairs or ramp for entrance   Can travel by private vehicle     Yes  Equipment Recommendations  Rolling walker (2 wheels)    Recommendations for Other Services       Precautions / Restrictions Precautions Precautions: Cervical;Back;Fall Precaution/Restrictions Comments: per pt, she has not been released from spinal precautions post lumbar fusion in November 2024. No cervical brace needed per orders 3/17. Restrictions Weight Bearing Restrictions Per Provider Order: No     Mobility  Bed Mobility               General bed mobility comments: NT, pt found sitting at EOB  with nursing/sister and left in recliner at end of session    Transfers Overall transfer level: Needs assistance Equipment used: Rolling walker (2 wheels) Transfers: Sit to/from Stand Sit to Stand: Contact guard assist           General transfer comment: Min verbal cues for hand placement with good eccentric and concentric control and stability    Ambulation/Gait Ambulation/Gait assistance: Contact guard assist Gait Distance (Feet): 30 Feet Assistive device: Rolling walker (2 wheels) Gait Pattern/deviations: Step-through pattern, Decreased step length - right, Decreased step length - left, Drifts right/left Gait velocity: decreased     General Gait Details: Mild drifting left/right during gait with occasional min A for stability, mod verbal cues for amb closer to the RW and upright for improved posture; pt reported dizziness/lightheadedness after amb 30 feet requiring a chair to be brought to the hall, BP 130/68 with HR 88, nursing notified.   Stairs             Wheelchair Mobility     Tilt Bed    Modified Rankin (Stroke Patients Only)       Balance Overall balance assessment: History of Falls, Needs assistance   Sitting balance-Leahy Scale: Good     Standing balance support: Bilateral upper extremity supported, During functional activity, Reliant on assistive device for balance Standing balance-Leahy Scale: Poor                              Communication Communication Communication: No apparent difficulties  Cognition Arousal: Alert Behavior During Therapy: WFL for tasks assessed/performed  PT - Cognitive impairments: No apparent impairments                         Following commands: Intact      Cueing Cueing Techniques: Verbal cues, Visual cues  Exercises Other Exercises Other Exercises: Cervical precaution education/review verbally and during functional tasks    General Comments        Pertinent Vitals/Pain Pain  Assessment Pain Assessment: 0-10 Pain Score: 10-Worst pain ever Pain Location: Cervical/incision pain Pain Descriptors / Indicators: Sore, Aching Pain Intervention(s): Repositioned, Premedicated before session, Monitored during session    Home Living                          Prior Function            PT Goals (current goals can now be found in the care plan section) Progress towards PT goals: PT to reassess next treatment    Frequency    7X/week      PT Plan      Co-evaluation              AM-PAC PT "6 Clicks" Mobility   Outcome Measure  Help needed turning from your back to your side while in a flat bed without using bedrails?: A Little Help needed moving from lying on your back to sitting on the side of a flat bed without using bedrails?: A Little Help needed moving to and from a bed to a chair (including a wheelchair)?: A Little Help needed standing up from a chair using your arms (e.g., wheelchair or bedside chair)?: A Little Help needed to walk in hospital room?: A Little Help needed climbing 3-5 steps with a railing? : A Lot 6 Click Score: 17    End of Session Equipment Utilized During Treatment: Gait belt Activity Tolerance: Patient tolerated treatment well Patient left: in chair;with call bell/phone within reach;with chair alarm set;with SCD's reapplied;with family/visitor present Nurse Communication: Mobility status PT Visit Diagnosis: History of falling (Z91.81);Unsteadiness on feet (R26.81);Difficulty in walking, not elsewhere classified (R26.2);Muscle weakness (generalized) (M62.81);Pain Pain - part of body:  (cervical)     Time: 2952-8413 PT Time Calculation (min) (ACUTE ONLY): 27 min  Charges:    $Gait Training: 8-22 mins $Therapeutic Activity: 8-22 mins PT General Charges $$ ACUTE PT VISIT: 1 Visit                     D. Scott Alson Mcpheeters PT, DPT 10/24/23, 10:19 AM

## 2023-10-24 NOTE — Progress Notes (Signed)
 Occupational Therapy Treatment Patient Details Name: Teresa [name] MRN: [id number] DOB: Oct 24, 1945 Today's Date: 10/24/2023   History of present illness Pt is a 78 y.o. female s/p ACDF C5-6 on 10/22/23. PMH significant for lumbar fusion 2024, paroxysmal AFIB, CAD, CVA/TIA, HTN, HLD, DM, seizures, COPD, chronic LBP, cervical stenosis, depression, anxiety, memory loss, R rotator cuff repair, L ankle sx   OT comments  Teresa [name] was seen for OT treatment on this date. Responded to chair alarm, on arrival pt standing in room walking to bathroom. Pt requires  CGA for toilet t/f. MIN A don/doff gown in standing, assist for multiple minor LOBs. Educated on falls prevention and importance of AD use. Pt making good progress toward goals, will continue to follow POC. Discharge recommendation remains appropriate.        If plan is discharge home, recommend the following:  A little help with walking and/or transfers;A little help with bathing/dressing/bathroom;Assistance with cooking/housework;Help with stairs or ramp for entrance;Assist for transportation   Equipment Recommendations  BSC/3in1;Tub/shower bench;Toilet riser;Toilet rise with handles    Recommendations for Other Services      Precautions / Restrictions Precautions Precautions: Cervical;Back;Fall Restrictions Weight Bearing Restrictions Per Provider Order: No       Mobility Bed Mobility               General bed mobility comments: not tested    Transfers Overall transfer level: Needs assistance Equipment used: None Transfers: Sit to/from Stand Sit to Stand: Contact guard assist                 Balance Overall balance assessment: History of Falls, Needs assistance Sitting-balance support: Single extremity supported, Feet supported Sitting balance-Leahy Scale: Good     Standing balance support: No upper extremity supported, During functional activity Standing balance-Leahy Scale: Poor                              ADL either performed or assessed with clinical judgement   ADL Overall ADL's : Needs assistance/impaired                                       General ADL Comments: CGA for toilet t/f. MIN A don/doff gown in standing.     Communication Communication Communication: No apparent difficulties   Cognition Arousal: Alert Behavior During Therapy: WFL for tasks assessed/performed Cognition: No family/caregiver present to determine baseline                               Following commands: Intact        Cueing   Cueing Techniques: Verbal cues, Visual cues             Pertinent Vitals/ Pain       Pain Assessment Pain Assessment: No/denies pain   Frequency  Min 2X/week        Progress Toward Goals  OT Goals(current goals can now be found in the care plan section)  Progress towards OT goals: Progressing toward goals  Acute Rehab OT Goals OT Goal Formulation: With patient Time For Goal Achievement: 11/06/23 Potential to Achieve Goals: Good ADL Goals Pt Will Perform Grooming: with modified independence;standing Pt Will Perform Lower Body Dressing: with modified independence;sit to/from stand;sitting/lateral leans Pt Will Transfer to Toilet: with modified independence;ambulating;regular height  toilet Pt Will Perform Toileting - Clothing Manipulation and hygiene: with modified independence;sit to/from stand;sitting/lateral leans;with adaptive equipment  Plan      Co-evaluation                 AM-PAC OT "6 Clicks" Daily Activity     Outcome Measure   Help from another person eating meals?: None Help from another person taking care of personal grooming?: None Help from another person toileting, which includes using toliet, bedpan, or urinal?: A Little Help from another person bathing (including washing, rinsing, drying)?: A Little Help from another person to put on and taking off regular upper body clothing?: A  Little Help from another person to put on and taking off regular lower body clothing?: A Little 6 Click Score: 20    End of Session    OT Visit Diagnosis: History of falling (Z91.81);Repeated falls (R29.6);Unsteadiness on feet (R26.81);Muscle weakness (generalized) (M62.81)   Activity Tolerance Patient tolerated treatment well   Patient Left in chair;with call bell/phone within reach;with chair alarm set   Nurse Communication          Time: 1610-9604 OT Time Calculation (min): 10 min  Charges: OT General Charges $OT Visit: 1 Visit OT Treatments $Self Care/Home Management : 8-22 mins  Kathie Dike, M.S. OTR/L  10/24/23, 1:15 PM  ascom [phone]

## 2023-10-25 ENCOUNTER — Observation Stay

## 2023-10-25 DIAGNOSIS — E669 Obesity, unspecified: Secondary | ICD-10-CM | POA: Diagnosis present

## 2023-10-25 DIAGNOSIS — I2510 Atherosclerotic heart disease of native coronary artery without angina pectoris: Secondary | ICD-10-CM | POA: Diagnosis present

## 2023-10-25 DIAGNOSIS — E785 Hyperlipidemia, unspecified: Secondary | ICD-10-CM | POA: Diagnosis present

## 2023-10-25 DIAGNOSIS — Z8616 Personal history of COVID-19: Secondary | ICD-10-CM | POA: Diagnosis not present

## 2023-10-25 DIAGNOSIS — Z8249 Family history of ischemic heart disease and other diseases of the circulatory system: Secondary | ICD-10-CM | POA: Diagnosis not present

## 2023-10-25 DIAGNOSIS — E039 Hypothyroidism, unspecified: Secondary | ICD-10-CM | POA: Diagnosis present

## 2023-10-25 DIAGNOSIS — I480 Paroxysmal atrial fibrillation: Secondary | ICD-10-CM | POA: Diagnosis present

## 2023-10-25 DIAGNOSIS — A419 Sepsis, unspecified organism: Secondary | ICD-10-CM | POA: Diagnosis not present

## 2023-10-25 DIAGNOSIS — G9589 Other specified diseases of spinal cord: Secondary | ICD-10-CM | POA: Diagnosis present

## 2023-10-25 DIAGNOSIS — I272 Pulmonary hypertension, unspecified: Secondary | ICD-10-CM | POA: Diagnosis present

## 2023-10-25 DIAGNOSIS — I5032 Chronic diastolic (congestive) heart failure: Secondary | ICD-10-CM | POA: Diagnosis present

## 2023-10-25 DIAGNOSIS — Z6832 Body mass index (BMI) 32.0-32.9, adult: Secondary | ICD-10-CM | POA: Diagnosis not present

## 2023-10-25 DIAGNOSIS — Z881 Allergy status to other antibiotic agents status: Secondary | ICD-10-CM | POA: Diagnosis not present

## 2023-10-25 DIAGNOSIS — Z794 Long term (current) use of insulin: Secondary | ICD-10-CM | POA: Diagnosis not present

## 2023-10-25 DIAGNOSIS — E876 Hypokalemia: Secondary | ICD-10-CM | POA: Diagnosis present

## 2023-10-25 DIAGNOSIS — Z7984 Long term (current) use of oral hypoglycemic drugs: Secondary | ICD-10-CM | POA: Diagnosis not present

## 2023-10-25 DIAGNOSIS — Z981 Arthrodesis status: Secondary | ICD-10-CM | POA: Diagnosis not present

## 2023-10-25 DIAGNOSIS — I110 Hypertensive heart disease with heart failure: Secondary | ICD-10-CM | POA: Diagnosis present

## 2023-10-25 DIAGNOSIS — G959 Disease of spinal cord, unspecified: Secondary | ICD-10-CM | POA: Diagnosis present

## 2023-10-25 DIAGNOSIS — J440 Chronic obstructive pulmonary disease with acute lower respiratory infection: Secondary | ICD-10-CM | POA: Diagnosis present

## 2023-10-25 DIAGNOSIS — E1140 Type 2 diabetes mellitus with diabetic neuropathy, unspecified: Secondary | ICD-10-CM | POA: Diagnosis present

## 2023-10-25 DIAGNOSIS — M4802 Spinal stenosis, cervical region: Secondary | ICD-10-CM | POA: Diagnosis present

## 2023-10-25 DIAGNOSIS — Z823 Family history of stroke: Secondary | ICD-10-CM | POA: Diagnosis not present

## 2023-10-25 DIAGNOSIS — Z79890 Hormone replacement therapy (postmenopausal): Secondary | ICD-10-CM | POA: Diagnosis not present

## 2023-10-25 DIAGNOSIS — Z7901 Long term (current) use of anticoagulants: Secondary | ICD-10-CM | POA: Diagnosis not present

## 2023-10-25 DIAGNOSIS — J189 Pneumonia, unspecified organism: Secondary | ICD-10-CM | POA: Diagnosis not present

## 2023-10-25 LAB — GLUCOSE, CAPILLARY
Glucose-Capillary: 183 mg/dL — ABNORMAL HIGH (ref 70–99)
Glucose-Capillary: 182 mg/dL — ABNORMAL HIGH (ref 70–99)
Glucose-Capillary: 206 mg/dL — ABNORMAL HIGH (ref 70–99)
Glucose-Capillary: 275 mg/dL — ABNORMAL HIGH (ref 70–99)

## 2023-10-25 LAB — CBC
HCT: 40.1 % (ref 36.0–46.0)
Hemoglobin: 13.6 g/dL (ref 12.0–15.0)
MCH: 29.8 pg (ref 26.0–34.0)
MCHC: 33.9 g/dL (ref 30.0–36.0)
MCV: 87.7 fL (ref 80.0–100.0)
Platelets: 189 10*3/uL (ref 150–400)
RBC: 4.57 MIL/uL (ref 3.87–5.11)
RDW: 16.4 % — ABNORMAL HIGH (ref 11.5–15.5)
WBC: 13.8 10*3/uL — ABNORMAL HIGH (ref 4.0–10.5)
nRBC: 0 % (ref 0.0–0.2)

## 2023-10-25 LAB — LACTIC ACID, PLASMA
Lactic Acid, Venous: 1.9 mmol/L (ref 0.5–1.9)
Lactic Acid, Venous: 3 mmol/L (ref 0.5–1.9)
Lactic Acid, Venous: 2.2 mmol/L (ref 0.5–1.9)

## 2023-10-25 LAB — BASIC METABOLIC PANEL
Anion gap: 8 (ref 5–15)
BUN: 13 mg/dL (ref 8–23)
CO2: 25 mmol/L (ref 22–32)
Calcium: 8.5 mg/dL — ABNORMAL LOW (ref 8.9–10.3)
Chloride: 102 mmol/L (ref 98–111)
Creatinine, Ser: 0.48 mg/dL (ref 0.44–1.00)
GFR, Estimated: >60 mL/min (ref >60)
Glucose, Bld: 183 mg/dL — ABNORMAL HIGH (ref 70–99)
Potassium: 3 mmol/L — ABNORMAL LOW (ref 3.5–5.1)
Sodium: 135 mmol/L (ref 135–145)

## 2023-10-25 LAB — URINALYSIS, COMPLETE (UACMP) WITH MICROSCOPIC
Bilirubin Urine: NEGATIVE
Glucose, UA: >=500 mg/dL — AB
Hgb urine dipstick: NEGATIVE
Ketones, ur: NEGATIVE mg/dL
Leukocytes,Ua: NEGATIVE
Nitrite: POSITIVE — AB
Protein, ur: NEGATIVE mg/dL
Specific Gravity, Urine: 1.01 (ref 1.005–1.030)
Squamous Epithelial / HPF: 0 /HPF (ref 0–5)
pH: 5 (ref 5.0–8.0)

## 2023-10-25 LAB — STREP PNEUMONIAE URINARY ANTIGEN: Strep Pneumo Urinary Antigen: NEGATIVE

## 2023-10-25 LAB — PROCALCITONIN: Procalcitonin: 0.32 ng/mL

## 2023-10-25 MED ORDER — FUROSEMIDE 20 MG PO TABS
40.0000 mg | ORAL_TABLET | Freq: Every day | ORAL | Status: DC
  Administered 2023-10-26 – 2023-10-28 (×3): 40 mg via ORAL
  Filled 2023-10-25 (×3): qty 2

## 2023-10-25 MED ORDER — POTASSIUM CHLORIDE CRYS ER 20 MEQ PO TBCR
40.0000 meq | EXTENDED_RELEASE_TABLET | Freq: Once | ORAL | Status: AC
  Administered 2023-10-25: 40 meq via ORAL
  Filled 2023-10-25: qty 2

## 2023-10-25 MED ORDER — SODIUM CHLORIDE 0.9 % IV BOLUS
1000.0000 mL | Freq: Once | INTRAVENOUS | Status: AC
  Administered 2023-10-25: 1000 mL via INTRAVENOUS

## 2023-10-25 MED ORDER — DOXYCYCLINE HYCLATE 100 MG PO TABS
100.0000 mg | ORAL_TABLET | Freq: Two times a day (BID) | ORAL | Status: DC
Start: 2023-10-25 — End: 2023-10-28
  Administered 2023-10-25 – 2023-10-28 (×7): 100 mg via ORAL
  Filled 2023-10-25 (×7): qty 1

## 2023-10-25 MED ORDER — GUAIFENESIN ER 600 MG PO TB12
600.0000 mg | ORAL_TABLET | Freq: Two times a day (BID) | ORAL | Status: DC
  Administered 2023-10-25 – 2023-10-28 (×7): 600 mg via ORAL
  Filled 2023-10-25 (×7): qty 1

## 2023-10-25 MED ORDER — METOCLOPRAMIDE HCL 5 MG/ML IJ SOLN
5.0000 mg | Freq: Four times a day (QID) | INTRAMUSCULAR | Status: AC
  Administered 2023-10-25 – 2023-10-28 (×11): 5 mg via INTRAVENOUS
  Filled 2023-10-25 (×11): qty 2

## 2023-10-25 MED ORDER — SODIUM CHLORIDE 0.9 % IV SOLN
2.0000 g | INTRAVENOUS | Status: DC
  Administered 2023-10-25 – 2023-10-27 (×3): 2 g via INTRAVENOUS
  Filled 2023-10-25 (×4): qty 20

## 2023-10-25 NOTE — Progress Notes (Signed)
 PT Cancellation Note  Patient Details Name: Teresa [name] MRN: [id number] DOB: 07/14/1946   Cancelled Treatment:     Spoke with nursing prior to attempting PT session. Pt currently febrile with continued lightheadedness with positional changes. IV team and phlebotomy in for blood work to r/o possible sepsis. Will re-attempt next available date/time per POC and tolerance.   Jannet Askew 10/25/2023, 1:52 PM

## 2023-10-25 NOTE — Progress Notes (Signed)
   Neurosurgery Progress Note  History: Teresa [name] iss/p C5-6 ACDF   POD2: Feeling light headed this morning despite normal BP.  POD1: pt doing well this morning. Reporting improved numbness in hands.   Physical Exam: Vitals:   10/25/23 0435 10/25/23 0747  BP: 134/74 122/68  Pulse: 85 72  Resp: 17 16  Temp: 98.7 F (37.1 C) 98.3 F (36.8 C)  SpO2: 94% 91%    AA Ox3 CNI  Strength:5/5 throughout BLE and BUE except 4/5 HG and IO.  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  - DVT prophylaxis - PTOT; dispo planning pending recommendations. Recommending SNF. SW involved. We appreciate your assistance.    Teresa Charity PA-C Department of Neurosurgery

## 2023-10-25 NOTE — Plan of Care (Signed)
  Problem: Education: Goal: Knowledge of General Education information will improve Description: Including pain rating scale, medication(s)/side effects and non-pharmacologic comfort measures Outcome: Progressing   Problem: Pain Managment: Goal: General experience of comfort will improve and/or be controlled Outcome: Progressing

## 2023-10-25 NOTE — Consult Note (Signed)
 Initial Consultation Note   Patient: Teresa [name]:[id number] DOB: March 25, 1946 PCP: Bosie Clos, MD DOA: 10/22/2023 DOS: the patient was seen and examined on 10/25/2023 Primary service: Venetia Night, MD  Referring physician: Dr. Myer Haff Reason for consult: Fever, cough, dry heaving  Assessment/Plan:  Sepsis, without acute endorgan damage -Sepsis evidenced by new onset of fever and leukocytosis and tachycardia, source infection likely is aspiration pneumonia versus CAP bacterial, on left lower lobe. -Start ceftriaxone and doxycycline -Check sputum culture, atypical pneumonia study, strep antigen -Hold Lasix for today  Intractable nausea and vomiting -Clinically suspect undiagnosed gastroparesis as patient reported that she has been having diabetes for 20+ years, with significant peripheral neuropathy.  And she usually eat small frequent meals to avoid early fullness.  Condition might be exacerbated by postop narcotics. -Trial of Reglan 5 mg every 6 hours -Outpatient follow-up with GI for gastric emptying study -Other DDx, patient abdominal exam benign and denied any constipation, low suspicion for SBO.  IIDM Diabetic neuropathy -Agreed with the current SSI and glipizide and metformin -Continue gabapentin  Hypokalemia -P.o. replacement    TRH will continue to follow the patient.  HPI: Teresa [name] is a 78 y.o. female with past medical history of IDDM, HTN, hypothyroidism, diabetic neuropathy, came to hospital for elective cervical spine surgery..  Patient underwent C-spine C5-6 ACDF surgery 2 days ago.  Since yesterday patient has developed intractable nauseous dry heaves and vomited x 1 overnight and this morning patient started develop shortness of breath and fever 102.1 and tachycardia.  Review of Systems: As mentioned in the history of present illness. All other systems reviewed and are negative. Past Medical History:  Diagnosis Date   (HFpEF) heart  failure with preserved ejection fraction (HCC)    a.) TTE 09/22/2014: EF >55%, no RWMAs, mild MAC, normal RVSF, mild TR/PR, mod MR; b.) TTE 08/04/2019: EF >55%, no RWMAs, normal RVSF, triv MR/TR; c.) TTE 06/14/2021: EF 60-65%, no RMWAs, normal RVSF, triv MR   Anginal pain (HCC)    Anxiety    Aortic atherosclerosis (HCC)    Arthritis    Asthma    Beta-blockers contraindicated    a.) significant bradycardia in the past while on therapy   Bilateral lower extremity edema    Cerebral microvascular disease    Cervical myelopathy (HCC)    Cervical spinal stenosis    Chronic bilateral low back pain with bilateral sciatica    a.) s.p L4-L5 lateral lumbar interbody fusion 06/18/2023   Complication of anesthesia    a.) PONV; b.) delayed emergence when she was younger   Concussion 08/17/2023   COPD (chronic obstructive pulmonary disease) (HCC)    Coronary artery disease    a.) cCTA 11/01/2021: Ca+ score 174 (67th percentile for age/sex match) --> FFR demonstrated no hemodynamically significant stenosis   Depression    Diverticulosis    DOE (dyspnea on exertion)    Dyspnea    GERD (gastroesophageal reflux disease)    a.) no daily Tx; s/p Nissen fundoplication   Gout    Heart murmur    History of 2019 novel coronavirus disease (COVID-19) 05/12/2019   History of hiatal hernia    a.) s/p Nissen fundoplication   History of kidney stones    History of orthopnea    HLD (hyperlipidemia)    Hypertension    Hypothyroidism    Lumbar stenosis 2024   Memory loss    a.) s/p "mini stroke" in 2021; b.) on acetylcholinesterase inhibitor (donepazil)  Migraines    Mobitz type I Wenckebach atrioventricular block    a.) holter study 01/27/2022   On apixaban therapy    PAF (paroxysmal atrial fibrillation) (HCC)    a.) CHA2DS2-VASc = 36 (age x2, sex, HFpEF, CVA x2, HTN, vascular disease history, T2DM) as of 06/14/2023;  b.) rate/rhythm maintained without pharmacological intervention; chronically  anticoagulated using standard dose apixaban   Palpitations    Pneumonia due to COVID-19 virus 05/2019   PONV (postoperative nausea and vomiting)    PSVT (paroxysmal supraventricular tachycardia) (HCC) 01/27/2022   a.) holter study 01/27/2022 - longest lasting 17 beats at a rate of 109 bpm   Pulmonary HTN (HCC)    RBBB (right bundle branch block)    Recurrent falls    Scleroderma (HCC)    Sleep apnea    a.) does not require nocturnal PAP therapy   Stroke (HCC) 2021   a.) "mild" per patient; no findings on imaging; presented with transient aphasia, memory loss, extremity numbness   T2DM (type 2 diabetes mellitus) (HCC)    Transfusion of blood product refused for religious reason (Jehovah's witness)    Vertigo    Vitamin D deficiency    Past Surgical History:  Procedure Laterality Date   ABDOMINAL HYSTERECTOMY     ovaries intact   ANKLE ARTHROSCOPY Left 03/03/2022   Procedure: ANKLE ARTHROSCOPY;  Surgeon: Edwin Cap, DPM;  Location: [facility];  Service: Podiatry;  Laterality: Left;  GENERAL WITH POP AND SAPHENOUS BLOCK   ANTERIOR CERVICAL DECOMP/DISCECTOMY FUSION N/A 10/22/2023   Procedure: ANTERIOR CERVICAL DECOMPRESSION/DISCECTOMY FUSION;  Surgeon: Venetia Night, MD;  Location: [facility];  Service: Neurosurgery;  Laterality: N/A;  C5-6 ANTERIOR CERVICAL DISCECTOMY AND FUSION   ANTERIOR LATERAL LUMBAR FUSION WITH PERCUTANEOUS SCREW 1 LEVEL N/A 06/18/2023   Procedure: L4-5 LATERAL LUMBAR INTERBODY FUSION AND POSTERIOR SPINAL FUSION;  Surgeon: Venetia Night, MD;  Location: [facility];  Service: Neurosurgery;  Laterality: N/A;   APPLICATION OF INTRAOPERATIVE CT SCAN N/A 06/18/2023   Procedure: APPLICATION OF INTRAOPERATIVE CT SCAN;  Surgeon: Venetia Night, MD;  Location: [facility];  Service: Neurosurgery;  Laterality: N/A;   BLADDER SURGERY     bladder tuck   BREAST BIOPSY Right 03/20/2016   neg x 2 area   CARDIAC CATHETERIZATION     CARPAL TUNNEL RELEASE Bilateral     CATARACT EXTRACTION W/PHACO Left 09/17/2018   Procedure: CATARACT EXTRACTION PHACO AND INTRAOCULAR LENS PLACEMENT (IOC) LEFT, DIABETIC;  Surgeon: Galen Manila, MD;  Location: [facility];  Service: Ophthalmology;  Laterality: Left;  [place] 00:34 CDE 4.85 Fluid pack lot # [id number]   CATARACT EXTRACTION W/PHACO Right 10/15/2018   Procedure: CATARACT EXTRACTION PHACO AND INTRAOCULAR LENS PLACEMENT (IOC)-RIGHT;  Surgeon: Galen Manila, MD;  Location: [facility];  Service: Ophthalmology;  Laterality: Right;  [place] 00:27.6 CDE 3.43 Fluid Pack Lot # [id number]   COLONOSCOPY WITH PROPOFOL N/A 09/27/2018   Procedure: COLONOSCOPY WITH PROPOFOL;  Surgeon: Pasty Spillers, MD;  Location: [facility];  Service: Endoscopy;  Laterality: N/A;   DILATION AND CURETTAGE OF UTERUS     EYE SURGERY     eyelid   FRACTURE SURGERY     left ankle-plate and screws palced   GRAFT APPLICATION Left 03/03/2022   Procedure: BONE GRAFT;  Surgeon: Edwin Cap, DPM;  Location: [facility];  Service: Podiatry;  Laterality: Left;   HARDWARE REMOVAL Left 03/03/2022   Procedure: POSSIBLE HARDWARE REMOVAL;  Surgeon: Edwin Cap, DPM;  Location: [facility];  Service:  Podiatry;  Laterality: Left;   LITHOTRIPSY     NISSEN FUNDOPLICATION     SHOULDER SURGERY Right    rotator cuff   TONSILLECTOMY     Social History:  reports that she has never smoked. She has never used smokeless tobacco. She reports that she does not drink alcohol and does not use drugs.  Allergies  Allergen Reactions   Clarithromycin Nausea And Vomiting and Other (See Comments)   Codeine     Hallucination   Dilaudid  [Hydromorphone Hcl] Nausea And Vomiting   Iodine Hives and Itching   Iohexol Hives and Itching   Onion Diarrhea and Nausea And Vomiting    Abdominal pain   Povidone-Iodine Itching   Tamiflu  [Oseltamivir Phosphate] Nausea And Vomiting    Abdominal Pain,   Zolpidem Nausea And Vomiting and Other (See Comments)    Hallucinations    Bacitracin-Neomycin-Polymyxin Itching and Rash   Benzalkonium Chloride Itching, Rash and Swelling   Lidocaine Hcl Itching, Rash and Swelling   Morphine Nausea And Vomiting and Rash   Neomycin-Bacitracin Zn-Polymyx Itching and Rash   Tape Itching and Rash    Adhesive tape - silicone   Tapentadol Rash    Family History  Problem Relation Age of Onset   Stroke Mother    Hypertension Mother    Heart disease Mother    Arthritis Mother    Heart disease Father    Hypertension Father    Diabetes Sister    Hypertension Sister    Asthma Sister    Hypertension Sister    Diverticulitis Sister    Colon cancer Maternal Grandmother    Breast cancer Maternal Grandmother    Breast cancer Maternal Aunt     Prior to Admission medications   Medication Sig Start Date End Date Taking? Authorizing Provider  albuterol (VENTOLIN HFA) 108 (90 Base) MCG/ACT inhaler Inhale 2 puffs into the lungs every 6 (six) hours as needed for shortness of breath. 03/26/21  Yes [provider]  allopurinol (ZYLOPRIM) 100 MG tablet Take 1 tablet (100 mg total) by mouth daily. Patient taking differently: Take 100 mg by mouth at bedtime. 07/06/22  Yes Simmons-Robinson, Makiera, MD  atorvastatin (LIPITOR) 10 MG tablet TAKE ONE TABLET BY MOUTH EVERYDAY AT BEDTIME 04/13/22  Yes Bosie Clos, MD  Continuous Glucose Receiver (FREESTYLE LIBRE 3 READER) DEVI 1 each every 14 (fourteen) days. 08/21/23  Yes [provider]  ELIQUIS 5 MG TABS tablet Take 5 mg by mouth 2 (two) times daily. 06/22/23  Yes [provider]  empagliflozin (JARDIANCE) 25 MG TABS tablet Take 1 tablet (25 mg total) by mouth daily. 07/06/22  Yes Simmons-Robinson, Makiera, MD  furosemide (LASIX) 40 MG tablet Take 1 tablet (40 mg total) by mouth daily. 07/06/22  Yes Simmons-Robinson, Makiera, MD  gabapentin (NEURONTIN) 800 MG tablet Take 1 tablet (800 mg total) by mouth 3 (three) times daily. 07/06/22  Yes Simmons-Robinson, Makiera, MD   glipiZIDE (GLUCOTROL) 5 MG tablet Take 2.5 mg by mouth daily before breakfast.   Yes [provider]  levothyroxine (SYNTHROID) 88 MCG tablet TAKE 1 TABLET BY MOUTH EVERY DAY 08/23/21  Yes Bosie Clos, MD  metFORMIN (GLUCOPHAGE) 1000 MG tablet Take 1 tablet (1,000 mg total) by mouth 2 (two) times daily. 07/06/22  Yes Simmons-Robinson, Makiera, MD  nystatin (MYCOSTATIN/NYSTOP) powder Apply 1 Application topically 3 (three) times daily. Patient taking differently: Apply 1 Application topically 3 (three) times daily as needed (skin irritation/yeast.). 07/24/22  Yes Alfredia Ferguson,  PA-C  nystatin cream (MYCOSTATIN) Apply 1 Application topically 2 (two) times daily as needed (skin irritation/rash.).   Yes [provider]  ondansetron (ZOFRAN-ODT) 4 MG disintegrating tablet Take 1 tablet (4 mg total) by mouth every 8 (eight) hours as needed for nausea or vomiting. 07/19/23  Yes Marcelino Duster, MD  OZEMPIC, 2 MG/DOSE, 8 MG/3ML SOPN Inject 2 mg into the skin every Sunday.   Yes [provider]  pioglitazone (ACTOS) 15 MG tablet Take 1 tablet (15 mg total) by mouth every morning. 07/12/22  Yes Caro Laroche, DO  potassium chloride (KLOR-CON) 10 MEQ tablet Take 1 tablet (10 mEq total) by mouth daily. Be sure to take dose on day of surgery. Follow up with PCP for repeat labs. 10/16/23  Yes Verlee Monte, NP  promethazine (PHENERGAN) 12.5 MG tablet Take 1 tablet (12.5 mg total) by mouth every 6 (six) hours as needed for nausea or vomiting. 07/21/22  Yes Alfredia Ferguson, PA-C  TRELEGY ELLIPTA 200-62.5-25 MCG/ACT AEPB Inhale 1 puff into the lungs daily. 06/08/23  Yes [provider]  acetaminophen (TYLENOL) 500 MG tablet Take 1,000 mg by mouth every 6 (six) hours as needed for mild pain or headache. Patient not taking: Reported on 10/22/2023    [provider]    Physical Exam: Vitals:   10/25/23 1043 10/25/23 1126 10/25/23 1258 10/25/23 1339  BP: (!)  142/108 139/61  117/64  Pulse: (!) 109 (!) 105  99  Resp: 20 19  19   Temp: 98.7 F (37.1 C) (!) 101.2 F (38.4 C) (!) (P) 100.6 F (38.1 C) 99.6 F (37.6 C)  TempSrc: Oral  (P) Oral   SpO2: 96% 92%  91%  Weight:      Height:       Eyes: PERRL, lids and conjunctivae normal ENMT: Mucous membranes are moist. Posterior pharynx clear of any exudate or lesions.Normal dentition.  Neck: normal, supple, no masses, no thyromegaly Respiratory: clear to auscultation bilaterally, no wheezing, no crackles. Normal respiratory effort. No accessory muscle use.  Cardiovascular: Regular rate and rhythm, no murmurs / rubs / gallops. No extremity edema. 2+ pedal pulses. No carotid bruits.  Abdomen: no tenderness, no masses palpated. No hepatosplenomegaly. Bowel sounds positive.  Musculoskeletal: no clubbing / cyanosis. No joint deformity upper and lower extremities. Good ROM, no contractures. Normal muscle tone.  Skin: no rashes, lesions, ulcers. No induration Neurologic: CN 2-12 grossly intact. Sensation intact, DTR normal.  Muscle strength 5/5 on both sides Psychiatric: Normal judgment and insight. Alert and oriented x 3. Normal mood.    Data Reviewed:  X-ray and labs involving CBC, BMP reviewed  Family Communication: None at bedside Primary team communication: Neurosurgery Thank you very much for involving [place] in the care of your patient.  Author: Emeline General, MD 10/25/2023 1:57 PM  For on call review [email or web address].

## 2023-10-25 NOTE — Progress Notes (Signed)
 MEWS Progress Note  Patient Details Name: Teresa [name] MRN: [id number] DOB: 11-01-1945 Today's Date: 10/25/2023   MEWS Flowsheet Documentation:  Assess: MEWS Score Temp: (!) 101.2 F (38.4 C) BP: 139/61 MAP (mmHg): 80 Pulse Rate: (!) 105 ECG Heart Rate: 82 Resp: 19 Level of Consciousness: Alert SpO2: 92 % O2 Device: Room Air Patient Activity (if Appropriate): In chair O2 Flow Rate (L/min): 2 L/min Assess: MEWS Score MEWS Temp: 1 MEWS Systolic: 0 MEWS Pulse: 1 MEWS RR: 0 MEWS LOC: 0 MEWS Score: 2 MEWS Score Color: Yellow Assess: SIRS CRITERIA SIRS Temperature : 1 SIRS Respirations : 0 SIRS Pulse: 1 SIRS WBC: 0 SIRS Score Sum : 2 SIRS Temperature : 1 SIRS Pulse: 1 SIRS Respirations : 0 SIRS WBC: 0 SIRS Score Sum : 2 Provider Notification Provider Name/Title: Suszanne Finch, MD Date Provider Notified: 10/25/23 Time Provider Notified: 1135 Method of Notification: Page (Secure Chat) Notification Reason: Change in status (Yellow MEWS) Provider response: See new orders Date of Provider Response: 10/25/23 Time of Provider Response: 1136  MD at bedside. See new orders.      Teresa [name] 10/25/2023, 11:39 AM

## 2023-10-25 NOTE — Plan of Care (Signed)
  Problem: Education: Goal: Knowledge of General Education information will improve Description: Including pain rating scale, medication(s)/side effects and non-pharmacologic comfort measures Outcome: Progressing   Problem: Health Behavior/Discharge Planning: Goal: Ability to manage health-related needs will improve Outcome: Progressing   Problem: Clinical Measurements: Goal: Ability to maintain clinical measurements within normal limits will improve Outcome: Progressing Goal: Will remain free from infection Outcome: Progressing Goal: Diagnostic test results will improve Outcome: Progressing Goal: Respiratory complications will improve Outcome: Progressing Goal: Cardiovascular complication will be avoided Outcome: Progressing   Problem: Activity: Goal: Risk for activity intolerance will decrease Outcome: Progressing   Problem: Nutrition: Goal: Adequate nutrition will be maintained Outcome: Progressing   Problem: Coping: Goal: Level of anxiety will decrease Outcome: Progressing   Problem: Elimination: Goal: Will not experience complications related to bowel motility Outcome: Progressing   Problem: Pain Managment: Goal: General experience of comfort will improve and/or be controlled Outcome: Progressing   Problem: Skin Integrity: Goal: Risk for impaired skin integrity will decrease Outcome: Progressing   Problem: Education: Goal: Ability to describe self-care measures that may prevent or decrease complications (Diabetes Survival Skills Education) will improve Outcome: Progressing Goal: Individualized Educational Video(s) Outcome: Progressing   Problem: Fluid Volume: Goal: Ability to maintain a balanced intake and output will improve Outcome: Progressing   Problem: Health Behavior/Discharge Planning: Goal: Ability to identify and utilize available resources and services will improve Outcome: Progressing Goal: Ability to manage health-related needs will  improve Outcome: Progressing   Problem: Metabolic: Goal: Ability to maintain appropriate glucose levels will improve Outcome: Progressing   Problem: Nutritional: Goal: Maintenance of adequate nutrition will improve Outcome: Progressing Goal: Progress toward achieving an optimal weight will improve Outcome: Progressing   Problem: Tissue Perfusion: Goal: Adequacy of tissue perfusion will improve Outcome: Progressing   Problem: Education: Goal: Ability to verbalize activity precautions or restrictions will improve Outcome: Progressing Goal: Knowledge of the prescribed therapeutic regimen will improve Outcome: Progressing Goal: Understanding of discharge needs will improve Outcome: Progressing   Problem: Activity: Goal: Ability to avoid complications of mobility impairment will improve Outcome: Progressing Goal: Ability to tolerate increased activity will improve Outcome: Progressing Goal: Will remain free from falls Outcome: Progressing   Problem: Bowel/Gastric: Goal: Gastrointestinal status for postoperative course will improve Outcome: Progressing   Problem: Clinical Measurements: Goal: Ability to maintain clinical measurements within normal limits will improve Outcome: Progressing Goal: Postoperative complications will be avoided or minimized Outcome: Progressing Goal: Diagnostic test results will improve Outcome: Progressing   Problem: Skin Integrity: Goal: Will show signs of wound healing Outcome: Progressing   Problem: Health Behavior/Discharge Planning: Goal: Identification of resources available to assist in meeting health care needs will improve Outcome: Progressing

## 2023-10-25 NOTE — Progress Notes (Signed)
   Neurosurgery Progress Note  History: Teresa [name] iss/p C5-6 ACDF   POD3: Doing well. Having expected pain. POD2: Feeling light headed this morning despite normal BP.  POD1: pt doing well this morning. Reporting improved numbness in hands.   Physical Exam: Vitals:   10/25/23 0435 10/25/23 0747  BP: 134/74 122/68  Pulse: 85 72  Resp: 17 16  Temp: 98.7 F (37.1 C) 98.3 F (36.8 C)  SpO2: 94% 91%    AA Ox3 CNI  Strength:5/5 throughout BLE and BUE except 4+ grip Incision remains c/d/I without significant swelling  Data:  Other tests/results: see results reviewed  Assessment/Plan:  Teresa [name] is a 78 y.o presenting with cervical myelopathy s/p C5-6 ACDF  - mobilize - pain control; discussed decreasing pain meds if she continued to have dizziness.  - DVT prophylaxis - PTOT; dispo planning pending recommendations. Recommending SNF. SW involved. We appreciate your assistance.    Venetia Night MD Department of Neurosurgery

## 2023-10-26 ENCOUNTER — Inpatient Hospital Stay

## 2023-10-26 DIAGNOSIS — J189 Pneumonia, unspecified organism: Secondary | ICD-10-CM | POA: Diagnosis not present

## 2023-10-26 DIAGNOSIS — Z981 Arthrodesis status: Secondary | ICD-10-CM

## 2023-10-26 DIAGNOSIS — M4802 Spinal stenosis, cervical region: Secondary | ICD-10-CM

## 2023-10-26 LAB — BASIC METABOLIC PANEL
Anion gap: 11 (ref 5–15)
BUN: 15 mg/dL (ref 8–23)
CO2: 25 mmol/L (ref 22–32)
Calcium: 8.3 mg/dL — ABNORMAL LOW (ref 8.9–10.3)
Chloride: 102 mmol/L (ref 98–111)
Creatinine, Ser: 0.65 mg/dL (ref 0.44–1.00)
GFR, Estimated: >60 mL/min (ref >60)
Glucose, Bld: 357 mg/dL — ABNORMAL HIGH (ref 70–99)
Potassium: 3.9 mmol/L (ref 3.5–5.1)
Sodium: 138 mmol/L (ref 135–145)

## 2023-10-26 LAB — RESPIRATORY PANEL BY PCR

## 2023-10-26 LAB — CBC
HCT: 36.5 % (ref 36.0–46.0)
Hemoglobin: 12.1 g/dL (ref 12.0–15.0)
MCH: 28.9 pg (ref 26.0–34.0)
MCHC: 33.2 g/dL (ref 30.0–36.0)
MCV: 87.3 fL (ref 80.0–100.0)
Platelets: 173 10*3/uL (ref 150–400)
RBC: 4.18 MIL/uL (ref 3.87–5.11)
RDW: 16.5 % — ABNORMAL HIGH (ref 11.5–15.5)
WBC: 11.5 10*3/uL — ABNORMAL HIGH (ref 4.0–10.5)
nRBC: 0 % (ref 0.0–0.2)

## 2023-10-26 LAB — LEGIONELLA PNEUMOPHILA SEROGP 1 UR AG: L. pneumophila Serogp 1 Ur Ag: NEGATIVE

## 2023-10-26 LAB — GLUCOSE, CAPILLARY
Glucose-Capillary: 193 mg/dL — ABNORMAL HIGH (ref 70–99)
Glucose-Capillary: 162 mg/dL — ABNORMAL HIGH (ref 70–99)
Glucose-Capillary: 73 mg/dL (ref 70–99)
Glucose-Capillary: 226 mg/dL — ABNORMAL HIGH (ref 70–99)

## 2023-10-26 MED ORDER — FLUCONAZOLE 50 MG PO TABS
150.0000 mg | ORAL_TABLET | Freq: Every day | ORAL | Status: AC
  Administered 2023-10-26 – 2023-10-27 (×2): 150 mg via ORAL
  Filled 2023-10-26 (×2): qty 1

## 2023-10-26 NOTE — Progress Notes (Signed)
 PROGRESS NOTE    Teresa [name]  HYQ:[id number] DOB: 19-May-1946 DOA: 10/22/2023 PCP: Bosie Clos, MD  Assessment & Plan:   Principal Problem:   S/P cervical spinal fusion Active Problems:   Cervical myelopathy (HCC)   Myelomalacia (HCC)   Spinal stenosis in cervical region  Assessment and Plan:  Sepsis: met criteria w/ fever, leukocytosis & likely secondary to CAP. Continue on IV rocephin, doxy, bronchodilators & encourage incentive spirometry. Sepsis resolved   CAP: continue on IV rocephin, doxy, bronchodilators & encourage incentive spirometry. Not requiring supplemental oxygen, saturating in mid-upper 90s on RA   Intractable nausea and vomiting: likely secondary to undiagnosed gastroparesis. Reglan q6h.    DM2: fair control, HbA1c 7.5 06/2023. Continue on SSI w/ accuchecks. Holding metformin & glipizide while inpatient  Diabetic neuropathy: continue on home dose of gabapentin    Hypokalemia: WNL today       DVT prophylaxis: lovenox  Code Status: full  Family Communication:  Disposition Plan: likely d/c back home b/c pt refuses SNF as well as HH   Level of care: Med-Surg  Status is: Inpatient Remains inpatient appropriate because: severity of illness, requiring IV abxs     Consultants:  Neuro surg   Procedures:   Antimicrobials: rocephin, doxy    Subjective: Pt c/o pain w/ deep breaths.   Objective: Vitals:   10/25/23 2140 10/26/23 0130 10/26/23 0611 10/26/23 0811  BP: 111/64 (!) 149/79 122/72 (!) 140/69  Pulse: 82 68 77 66  Resp: 18 20 18 16   Temp: 98.1 F (36.7 C) 97.8 F (36.6 C) 97.7 F (36.5 C) 98 F (36.7 C)  TempSrc:  Oral  Oral  SpO2: 94% 97% 94% 95%  Weight:      Height:        Intake/Output Summary (Last 24 hours) at 10/26/2023 0856 Last data filed at 10/25/2023 1746 Gross per 24 hour  Intake 861.11 ml  Output --  Net 861.11 ml   Filed Weights   10/22/23 1054  Weight: 79.8 kg    Examination:  General exam: Appears  calm and comfortable  Respiratory system: decreased breath sounds b/l  Cardiovascular system: S1 & S2 +. No  rubs, gallops or clicks. Gastrointestinal system: Abdomen is obese, soft and nontender. Normal bowel sounds heard. Central nervous system: Alert and oriented. Moves all extremities  Psychiatry: Judgement and insight appear normal. Mood & affect appropriate.     Data Reviewed: I have personally reviewed following labs and imaging studies  CBC: Recent Labs  Lab 10/22/23 1112 10/25/23 1240 10/26/23 0424  WBC  --  13.8* 11.5*  HGB 15.6* 13.6 12.1  HCT 46.0 40.1 36.5  MCV  --  87.7 87.3  PLT  --  189 173   Basic Metabolic Panel: Recent Labs  Lab 10/22/23 1112 10/25/23 1240 10/26/23 0424  NA 137 135 138  K 4.7 3.0* 3.9  CL 106 102 102  CO2  --  25 25  GLUCOSE 160* 183* 357*  BUN 22 13 15   CREATININE 0.50 0.48 0.65  CALCIUM  --  8.5* 8.3*   GFR: Estimated Creatinine Clearance: 56.7 mL/min (by C-G formula based on SCr of 0.65 mg/dL). Liver Function Tests: No results for input(s): "AST", "ALT", "ALKPHOS", "BILITOT", "PROT", "ALBUMIN" in the last 168 hours. No results for input(s): "LIPASE", "AMYLASE" in the last 168 hours. No results for input(s): "AMMONIA" in the last 168 hours. Coagulation Profile: No results for input(s): "INR", "PROTIME" in the last 168 hours. Cardiac Enzymes: No  results for input(s): "CKTOTAL", "CKMB", "CKMBINDEX", "TROPONINI" in the last 168 hours. BNP (last 3 results) No results for input(s): "PROBNP" in the last 8760 hours. HbA1C: No results for input(s): "HGBA1C" in the last 72 hours. CBG: Recent Labs  Lab 10/25/23 0752 10/25/23 1051 10/25/23 1648 10/25/23 2146 10/26/23 0804  GLUCAP 183* 182* 206* 275* 193*   Lipid Profile: No results for input(s): "CHOL", "HDL", "LDLCALC", "TRIG", "CHOLHDL", "LDLDIRECT" in the last 72 hours. Thyroid Function Tests: No results for input(s): "TSH", "T4TOTAL", "FREET4", "T3FREE", "THYROIDAB" in  the last 72 hours. Anemia Panel: No results for input(s): "VITAMINB12", "FOLATE", "FERRITIN", "TIBC", "IRON", "RETICCTPCT" in the last 72 hours. Sepsis Labs: Recent Labs  Lab 10/25/23 1240 10/25/23 1245 10/25/23 1348 10/25/23 1749  PROCALCITON 0.32  --   --   --   LATICACIDVEN  --  1.9 3.0* 2.2*    Recent Results (from the past 240 hours)  Culture, blood (Routine X 2) w Reflex to ID Panel     Status: None (Preliminary result)   Collection Time: 10/25/23 12:40 PM   Specimen: BLOOD  Result Value Ref Range Status   Specimen Description BLOOD LEFT HAND  Final   Special Requests   Final    BOTTLES DRAWN AEROBIC AND ANAEROBIC Blood Culture adequate volume   Culture   Final    NO GROWTH < 24 HOURS Performed at [facility], [address], [place], [place] [address]    Report Status PENDING  Incomplete  Culture, blood (Routine X 2) w Reflex to ID Panel     Status: None (Preliminary result)   Collection Time: 10/25/23 12:41 PM   Specimen: BLOOD  Result Value Ref Range Status   Specimen Description BLOOD RIGHT HAND  Final   Special Requests   Final    BOTTLES DRAWN AEROBIC AND ANAEROBIC Blood Culture adequate volume   Culture   Final    NO GROWTH < 24 HOURS Performed at [facility], [address] [place], [place] [address]    Report Status PENDING  Incomplete  Respiratory (~20 pathogens) panel by PCR     Status: None   Collection Time: 10/25/23  6:00 PM   Specimen: Nasopharyngeal Swab; Respiratory  Result Value Ref Range Status   Adenovirus NOT DETECTED NOT DETECTED Final   Coronavirus 229E NOT DETECTED NOT DETECTED Final    Comment: (NOTE) The Coronavirus on the Respiratory Panel, DOES NOT test for the novel  Coronavirus (2019 nCoV)    Coronavirus HKU1 NOT DETECTED NOT DETECTED Final   Coronavirus NL63 NOT DETECTED NOT DETECTED Final   Coronavirus OC43 NOT DETECTED NOT DETECTED Final   Metapneumovirus NOT DETECTED NOT DETECTED Final   Rhinovirus /  Enterovirus NOT DETECTED NOT DETECTED Final   Influenza A NOT DETECTED NOT DETECTED Final   Influenza B NOT DETECTED NOT DETECTED Final   Parainfluenza Virus 1 NOT DETECTED NOT DETECTED Final   Parainfluenza Virus 2 NOT DETECTED NOT DETECTED Final   Parainfluenza Virus 3 NOT DETECTED NOT DETECTED Final   Parainfluenza Virus 4 NOT DETECTED NOT DETECTED Final   Respiratory Syncytial Virus NOT DETECTED NOT DETECTED Final   Bordetella pertussis NOT DETECTED NOT DETECTED Final   Bordetella Parapertussis NOT DETECTED NOT DETECTED Final   Chlamydophila pneumoniae NOT DETECTED NOT DETECTED Final   Mycoplasma pneumoniae NOT DETECTED NOT DETECTED Final    Comment: Performed at [facility], [address], [place], [place] [address]  Radiology Studies: DG Chest 1 View Result Date: 10/25/2023 CLINICAL DATA:  CHF.  History of asthma, COPD, and hypertension. EXAM: CHEST  1 VIEW COMPARISON:  Chest radiograph dated 08/17/2023 and CT dated 09/11/2023. FINDINGS: Faint densities at the left lung base may represent atelectasis or developing infiltrate. No pleural effusion pneumothorax. The cardiac silhouette is within limits. No acute osseous pathology. IMPRESSION: Left basilar atelectasis versus developing infiltrate. Electronically Signed   By: Elgie Collard M.D.   On: 10/25/2023 15:24        Scheduled Meds:  acetaminophen  1,000 mg Oral Q6H   allopurinol  100 mg Oral QHS   atorvastatin  10 mg Oral Daily   docusate sodium  100 mg Oral BID   doxycycline  100 mg Oral Q12H   enoxaparin (LOVENOX) injection  40 mg Subcutaneous Q24H   fluticasone furoate-vilanterol  1 puff Inhalation Daily   And   umeclidinium bromide  1 puff Inhalation Daily   furosemide  40 mg Oral Daily   gabapentin  800 mg Oral TID   glipiZIDE  2.5 mg Oral QAC breakfast   guaiFENesin  600 mg Oral BID   insulin aspart  0-15 Units Subcutaneous TID WC   insulin aspart  0-5 Units Subcutaneous QHS   levothyroxine   88 mcg Oral Daily   metFORMIN  1,000 mg Oral BID WC   metoCLOPramide (REGLAN) injection  5 mg Intravenous Q6H   pioglitazone  15 mg Oral q morning   potassium chloride  10 mEq Oral Daily   senna  1 tablet Oral BID   sodium chloride flush  3 mL Intravenous Q12H   Continuous Infusions:  cefTRIAXone (ROCEPHIN)  IV Stopped (10/25/23 1515)     LOS: 1 day       Charise Killian, MD [facility] Pager [phone]  If 7PM-7AM, please contact night-coverage [email or web address] 10/26/2023, 8:56 AM

## 2023-10-26 NOTE — Progress Notes (Signed)
   Neurosurgery Progress Note  History: ROANNE HAYE iss/p C5-6 ACDF   POD4: pt developed fever and cough yesterday. Feels the same this morning  POD3: Doing well. Having expected pain. POD2: Feeling light headed this morning despite normal BP.  POD1: pt doing well this morning. Reporting improved numbness in hands.   Physical Exam: Vitals:   10/26/23 0130 10/26/23 0611  BP: (!) 149/79 122/72  Pulse: 68 77  Resp: 20 18  Temp: 97.8 F (36.6 C) 97.7 F (36.5 C)  SpO2: 97% 94%    AA Ox3 CNI  Strength:5/5 throughout BLE and BUE except 4+ grip Incision remains c/d/I without significant swelling Breathing comfortably on RA  Data:  Other tests/results: see results reviewed  Assessment/Plan:  DARON BREEDING is a 78 y.o presenting with cervical myelopathy s/p C5-6 ACDF  - mobilize - pain control; discussed decreasing pain meds if she continued to have dizziness.  - DVT prophylaxis - PTOT - Hospitalist consulted yesterday for SOB and cough. We appreciate your assistance. - Pt transferred to medicine service. Neurosurgery will continue to follow.   Manning Charity PA-C Department of Neurosurgery

## 2023-10-26 NOTE — Plan of Care (Signed)
   Problem: Coping: Goal: Level of anxiety will decrease Outcome: Progressing   Problem: Elimination: Goal: Will not experience complications related to bowel motility Outcome: Progressing Goal: Will not experience complications related to urinary retention Outcome: Progressing

## 2023-10-26 NOTE — Progress Notes (Signed)
 PT Cancellation Note  Patient Details Name: Teresa [name] MRN: [id number] DOB: 07-05-1946   Cancelled Treatment:     Therapist in to see pt for continued PT, feeling better today, however she had just finished working with OT and was fatigued. Pt politely declined PT session.    Jannet Askew 10/26/2023, 4:13 PM

## 2023-10-26 NOTE — Progress Notes (Signed)
 Occupational Therapy Treatment Patient Details Name: Teresa [name] MRN: [id number] DOB: 05-Feb-1946 Today's Date: 10/26/2023   History of present illness Pt is a 78 y.o. female s/p ACDF C5-6 on 10/22/23. PMH significant for lumbar fusion 2024, paroxysmal AFIB, CAD, CVA/TIA, HTN, HLD, DM, seizures, COPD, chronic LBP, cervical stenosis, depression, anxiety, memory loss, R rotator cuff repair, L ankle sx   OT comments  Teresa [name] was seen for OT treatment on this date. Upon arrival to room pt seated in chair, agreeable to tx. Pt requires MIN A + HHA for ADL t/f ~30 ft. Completed standing functional balance task reaching inside BOS at shoulder height and to waist height, multiple minor LOBs requiring MIN A to correct. Pt making good progress toward goals, will continue to follow POC. Discharge recommendation remains appropriate.        If plan is discharge home, recommend the following:  A little help with walking and/or transfers;A little help with bathing/dressing/bathroom;Assistance with cooking/housework;Help with stairs or ramp for entrance;Assist for transportation   Equipment Recommendations  BSC/3in1;Tub/shower bench;Toilet riser;Toilet rise with handles    Recommendations for Other Services      Precautions / Restrictions Precautions Precautions: Cervical;Fall;Back Restrictions Weight Bearing Restrictions Per Provider Order: No       Mobility Bed Mobility Overal bed mobility: Modified Independent                  Transfers Overall transfer level: Needs assistance Equipment used: None Transfers: Sit to/from Stand Sit to Stand: Min assist                 Balance Overall balance assessment: History of Falls, Needs assistance Sitting-balance support: Single extremity supported, Feet supported Sitting balance-Leahy Scale: Good     Standing balance support: No upper extremity supported, During functional activity Standing balance-Leahy Scale: Poor                              ADL either performed or assessed with clinical judgement   ADL Overall ADL's : Needs assistance/impaired                                       General ADL Comments: functional reaching task reaching inside BOS at shoulder height and to waist height, multiple minor LOBs requiring MIN A to correct     Communication Communication Communication: No apparent difficulties   Cognition Arousal: Alert Behavior During Therapy: WFL for tasks assessed/performed Cognition: No family/caregiver present to determine baseline             OT - Cognition Comments: poor insight into deficits, tends to be impulsive at times which increases risk of falls                 Following commands: Intact        Cueing   Cueing Techniques: Verbal cues, Visual cues             Pertinent Vitals/ Pain       Pain Assessment Pain Assessment: 0-10 Pain Score: 8  Pain Location: Cervical/incision pain Pain Descriptors / Indicators: Sore, Aching Pain Intervention(s): Limited activity within patient's tolerance, Repositioned   Frequency  Min 2X/week        Progress Toward Goals  OT Goals(current goals can now be found in the care plan section)  Progress towards OT goals: Progressing  toward goals  Acute Rehab OT Goals OT Goal Formulation: With patient Time For Goal Achievement: 11/06/23 Potential to Achieve Goals: Good ADL Goals Pt Will Perform Grooming: with modified independence;standing Pt Will Perform Lower Body Dressing: with modified independence;sit to/from stand;sitting/lateral leans Pt Will Transfer to Toilet: with modified independence;ambulating;regular height toilet Pt Will Perform Toileting - Clothing Manipulation and hygiene: with modified independence;sit to/from stand;sitting/lateral leans;with adaptive equipment  Plan      Co-evaluation                 AM-PAC OT "6 Clicks" Daily Activity     Outcome Measure    Help from another person eating meals?: None Help from another person taking care of personal grooming?: None Help from another person toileting, which includes using toliet, bedpan, or urinal?: A Little Help from another person bathing (including washing, rinsing, drying)?: A Little Help from another person to put on and taking off regular upper body clothing?: A Little Help from another person to put on and taking off regular lower body clothing?: A Little 6 Click Score: 20    End of Session Equipment Utilized During Treatment: Gait belt  OT Visit Diagnosis: History of falling (Z91.81);Repeated falls (R29.6);Unsteadiness on feet (R26.81);Muscle weakness (generalized) (M62.81)   Activity Tolerance Patient tolerated treatment well   Patient Left in bed;with call bell/phone within reach;with bed alarm set   Nurse Communication          Time: 6301-6010 OT Time Calculation (min): 9 min  Charges: OT General Charges $OT Visit: 1 Visit OT Treatments $Therapeutic Activity: 8-22 mins  Teresa [name], M.S. OTR/L  10/26/23, 3:48 PM  ascom [phone]

## 2023-10-26 NOTE — Plan of Care (Signed)

## 2023-10-27 DIAGNOSIS — J189 Pneumonia, unspecified organism: Secondary | ICD-10-CM | POA: Diagnosis not present

## 2023-10-27 DIAGNOSIS — Z981 Arthrodesis status: Secondary | ICD-10-CM | POA: Diagnosis not present

## 2023-10-27 LAB — CBC
HCT: 37.6 % (ref 36.0–46.0)
Hemoglobin: 12.6 g/dL (ref 12.0–15.0)
MCH: 28.8 pg (ref 26.0–34.0)
MCHC: 33.5 g/dL (ref 30.0–36.0)
MCV: 86 fL (ref 80.0–100.0)
Platelets: 222 10*3/uL (ref 150–400)
RBC: 4.37 MIL/uL (ref 3.87–5.11)
RDW: 16.4 % — ABNORMAL HIGH (ref 11.5–15.5)
WBC: 9.3 10*3/uL (ref 4.0–10.5)
nRBC: 0 % (ref 0.0–0.2)

## 2023-10-27 LAB — GLUCOSE, CAPILLARY
Glucose-Capillary: 154 mg/dL — ABNORMAL HIGH (ref 70–99)
Glucose-Capillary: 164 mg/dL — ABNORMAL HIGH (ref 70–99)
Glucose-Capillary: 205 mg/dL — ABNORMAL HIGH (ref 70–99)
Glucose-Capillary: 154 mg/dL — ABNORMAL HIGH (ref 70–99)

## 2023-10-27 LAB — BASIC METABOLIC PANEL
Anion gap: 6 (ref 5–15)
BUN: 15 mg/dL (ref 8–23)
CO2: 26 mmol/L (ref 22–32)
Calcium: 8.7 mg/dL — ABNORMAL LOW (ref 8.9–10.3)
Chloride: 105 mmol/L (ref 98–111)
Creatinine, Ser: 0.46 mg/dL (ref 0.44–1.00)
GFR, Estimated: >60 mL/min (ref >60)
Glucose, Bld: 173 mg/dL — ABNORMAL HIGH (ref 70–99)
Potassium: 3.6 mmol/L (ref 3.5–5.1)
Sodium: 137 mmol/L (ref 135–145)

## 2023-10-27 LAB — MYCOPLASMA PNEUMONIAE ANTIBODY, IGM: Mycoplasma pneumo IgM: <770 U/mL (ref 0–769)

## 2023-10-27 NOTE — Progress Notes (Signed)
   Neurosurgery Progress Note  History: Teresa [name] iss/p C5-6 ACDF   POD5: Doing well.  Feeling better after starting antibiotics. POD4: pt developed fever and cough yesterday. Feels the same this morning  POD3: Doing well. Having expected pain. POD2: Feeling light headed this morning despite normal BP.  POD1: pt doing well this morning. Reporting improved numbness in hands.   Physical Exam: Vitals:   10/27/23 0501 10/27/23 0826  BP: 136/89 136/73  Pulse: 63 78  Resp: 18 16  Temp: 97.7 F (36.5 C) 97.7 F (36.5 C)  SpO2: 98% 94%    AA Ox3 CNI  Strength:5/5 throughout BLE and BUE except 4+ grip Incision remains c/d/I without significant swelling Breathing comfortably on RA  Data:  Other tests/results: see results reviewed  Assessment/Plan:  Teresa [name] is a 78 y.o presenting with cervical myelopathy s/p C5-6 ACDF  - mobilize - pain control; discussed decreasing pain meds if she continued to have dizziness.  - DVT prophylaxis - PTOT - Pt transferred to medicine service. Defer further care to medicine.  Venetia Night MD Department of Neurosurgery

## 2023-10-27 NOTE — Plan of Care (Signed)

## 2023-10-27 NOTE — TOC Progression Note (Signed)
 Transition of Care [facility]) - Progression Note    Patient Details  Name: Teresa [name] MRN: [id number] Date of Birth: [birth year]  Transition of Care [facility]) CM/SW Contact  Bing Quarry, RN Phone Number: 10/27/2023, 12:57 PM  Clinical Narrative:  3/22: Patient is declining SNF, wants to go home with home health. Was last active with [facility] as of 09/27/23. Left a generic confidential VM with Velna Hatchet at [place] and a call back number. Pending discharge when agency can accept.    Gabriel Cirri MSN RN CM  RN Case Manager [place]  Transitions of Care Direct Dial: [phone] (Weekends Only) [facility] Main Office Phone: [phone] [facility] Fax: [phone] [email or web address]          Expected Discharge Plan and Services                                               Social Determinants of Health (SDOH) Interventions SDOH Screenings   Food Insecurity: No Food Insecurity (10/22/2023)  Housing: Low Risk  (10/22/2023)  Recent Concern: Housing - High Risk (08/21/2023)   [facility]  Transportation Needs: No Transportation Needs (10/22/2023)  Utilities: Not At Risk (10/22/2023)  Alcohol Screen: Low Risk  (09/19/2021)  Depression (PHQ2-9): Low Risk  (08/22/2022)  Recent Concern: Depression (PHQ2-9) - Medium Risk (07/21/2022)  Financial Resource Strain: Medium Risk (08/23/2023)   Received from [facility]  Physical Activity: Sufficiently Active (09/19/2021)  Social Connections: Moderately Integrated (10/23/2023)  Stress: Stress Concern Present (09/19/2021)  Tobacco Use: Low Risk  (10/22/2023)    Readmission Risk Interventions     No data to display

## 2023-10-27 NOTE — Plan of Care (Signed)

## 2023-10-27 NOTE — Progress Notes (Signed)
 Physical Therapy Treatment Patient Details Name: Teresa [name] MRN: [id number] DOB: 11/15/1945 Today's Date: 10/27/2023   History of Present Illness Pt is a 78 y.o. female s/p ACDF C5-6 on 10/22/23. PMH significant for lumbar fusion 2024, paroxysmal AFIB, CAD, CVA/TIA, HTN, HLD, DM, seizures, COPD, chronic LBP, cervical stenosis, depression, anxiety, memory loss, R rotator cuff repair, L ankle sx    PT Comments  Pt is making good progress with mobility demonstrating bed mobility at ModI level, transfers at [facility] -supervsion level, low level dynamic standing balance with intermittent UE support at [facility] to supervision level, gait 120' with RW at [facility] level, and stair negotiation with min A-CGA.  Continued PT will assist pt towards greater dynamic standing balance, LE strengthening, and activity tolerance to increase safety and independence and decrease burden of care with functional mobility.     If plan is discharge home, recommend the following: A little help with walking and/or transfers;A little help with bathing/dressing/bathroom;Assistance with cooking/housework;Assist for transportation;Help with stairs or ramp for entrance   Can travel by private vehicle     Yes  Equipment Recommendations  Rolling walker (2 wheels)    Recommendations for Other Services       Precautions / Restrictions Precautions Precaution/Restrictions Comments: per pt, she has not been released from spinal precautions post lumbar fusion in November 2024. No cervical brace needed per orders 3/17. Restrictions Weight Bearing Restrictions Per Provider Order: No     Mobility  Bed Mobility Overal bed mobility: Modified Independent                  Transfers Overall transfer level: Needs assistance Equipment used: None, Rolling walker (2 wheels) Transfers: Sit to/from Stand, Bed to chair/wheelchair/BSC Sit to Stand: Contact guard assist   Step pivot transfers: Contact guard assist       General  transfer comment: good eccentric and concentric control and stability    Ambulation/Gait Ambulation/Gait assistance: Contact guard assist Gait Distance (Feet): 120 Feet Assistive device: Rolling walker (2 wheels) Gait Pattern/deviations: Step-through pattern, Decreased step length - right, Decreased step length - left, Drifts right/left Gait velocity: decreased     General Gait Details: no drifting this session.  Standing BP taken be fore gait 116/67. After gait SPO2 96% HR70bpm.   Stairs Stairs: Yes Stairs assistance: Contact guard assist, Min assist Stair Management: One rail Right, Sideways Number of Stairs: 12 General stair comments: CGA to Min A x1 2/2 slight LOB with misplaced foot when descending.   Wheelchair Mobility     Tilt Bed    Modified Rankin (Stroke Patients Only)       Balance Overall balance assessment: History of Falls, Needs assistance Sitting-balance support: Feet supported Sitting balance-Leahy Scale: Normal     Standing balance support: During functional activity, Reliant on assistive device for balance, Bilateral upper extremity supported, No upper extremity supported Standing balance-Leahy Scale: Fair Standing balance comment: intermittent UE support with functional activities.  No LOB posteriorly this session.                            Communication Communication Communication: No apparent difficulties  Cognition Arousal: Alert     PT - Cognitive impairments: No apparent impairments                         Following commands: Intact      Cueing Cueing Techniques: Verbal cues, Visual cues  Exercises      General Comments        Pertinent Vitals/Pain Pain Assessment Pain Score: 8  Pain Location: Cervical/incision pain and chest Pain Descriptors / Indicators: Sore, Aching Pain Intervention(s): Monitored during session, Patient requesting pain meds-RN notified    Home Living                           Prior Function            PT Goals (current goals can now be found in the care plan section) Acute Rehab PT Goals Patient Stated Goal: Improved balance for decreased falls PT Goal Formulation: With patient Time For Goal Achievement: 11/05/23 Potential to Achieve Goals: Good Progress towards PT goals: Progressing toward goals    Frequency    7X/week      PT Plan      Co-evaluation              AM-PAC PT "6 Clicks" Mobility   Outcome Measure  Help needed turning from your back to your side while in a flat bed without using bedrails?: None Help needed moving from lying on your back to sitting on the side of a flat bed without using bedrails?: None Help needed moving to and from a bed to a chair (including a wheelchair)?: A Little Help needed standing up from a chair using your arms (e.g., wheelchair or bedside chair)?: A Little Help needed to walk in hospital room?: A Little Help needed climbing 3-5 steps with a railing? : A Little 6 Click Score: 20    End of Session Equipment Utilized During Treatment: Gait belt Activity Tolerance: Patient tolerated treatment well Patient left: in bed;with call bell/phone within reach;with nursing/sitter in room Nurse Communication: Mobility status PT Visit Diagnosis: History of falling (Z91.81);Unsteadiness on feet (R26.81);Difficulty in walking, not elsewhere classified (R26.2);Muscle weakness (generalized) (M62.81);Pain Pain - Right/Left:  (mid cervical) Pain - part of body:  (cervical and chest)     Time: 1478-2956 PT Time Calculation (min) (ACUTE ONLY): 16 min  Charges:    $Therapeutic Activity: 23-37 mins PT General Charges $$ ACUTE PT VISIT: 1 Visit                     Hortencia Conradi, PTA  10/27/23, 3:37 PM

## 2023-10-27 NOTE — Progress Notes (Signed)
 PROGRESS NOTE    Teresa [name]  TKZ:[id number] DOB: 08-08-1945 DOA: 10/22/2023 PCP: Bosie Clos, MD  Assessment & Plan:   Principal Problem:   S/P cervical spinal fusion Active Problems:   Cervical myelopathy (HCC)   Myelomalacia (HCC)   Spinal stenosis in cervical region  Assessment and Plan:  Sepsis: met criteria w/ fever, leukocytosis & likely secondary to CAP. Continue on IV rocephin, doxy, bronchodilators & encourage incentive spirometry. Sepsis resolved   CAP: continue on IV rocephin, doxy, bronchodilators & encourage incentive spirometry. Still not requiring supplemental oxygen. C/o pain w/ deep breaths  Intractable nausea and vomiting: likely secondary to undiagnosed gastroparesis. Reglan q6h.    DM2: fair control, HbA1c 7.5 06/2023. Continue on SSI w/ accuchecks  Diabetic neuropathy: continue on home dose of gabapentin    Hypokalemia: WNL today   Obesity: BMI 32.1. Would benefit from weight loss    DVT prophylaxis: lovenox  Code Status: full  Family Communication:  Disposition Plan: likely d/c back home b/c pt refuses SNF as well as HH   Level of care: Med-Surg  Status is: Inpatient Remains inpatient appropriate because: severity of illness, requiring IV abxs.     Consultants:  Neuro surg   Procedures:   Antimicrobials: rocephin, doxy    Subjective: Pt still c/o pain w/ deep breaths  Objective: Vitals:   10/26/23 1806 10/26/23 2112 10/27/23 0501 10/27/23 0826  BP: 137/83 (!) 156/80 136/89 136/73  Pulse: 78 78 63 78  Resp: 16 18 18 16   Temp: 97.7 F (36.5 C) (!) 97.4 F (36.3 C) 97.7 F (36.5 C) 97.7 F (36.5 C)  TempSrc:      SpO2: 98% 97% 98% 94%  Weight:      Height:        Intake/Output Summary (Last 24 hours) at 10/27/2023 0901 Last data filed at 10/26/2023 1926 Gross per 24 hour  Intake 600 ml  Output --  Net 600 ml   Filed Weights   10/22/23 1054  Weight: 79.8 kg    Examination:  General exam: Appears  comfortable  Respiratory system: diminished breath sounds b/l  Cardiovascular system: S1/S2+. No rubs or gallops Gastrointestinal system: Abd is soft, NT, obese & hypoactive bowel sounds Central nervous system: alert & awake. Moves all extremities  Psychiatry: Judgement and insight appears at baseline. Flat mood and affect    Data Reviewed: I have personally reviewed following labs and imaging studies  CBC: Recent Labs  Lab 10/22/23 1112 10/25/23 1240 10/26/23 0424 10/27/23 0621  WBC  --  13.8* 11.5* 9.3  HGB 15.6* 13.6 12.1 12.6  HCT 46.0 40.1 36.5 37.6  MCV  --  87.7 87.3 86.0  PLT  --  189 173 222   Basic Metabolic Panel: Recent Labs  Lab 10/22/23 1112 10/25/23 1240 10/26/23 0424 10/27/23 0621  NA 137 135 138 137  K 4.7 3.0* 3.9 3.6  CL 106 102 102 105  CO2  --  25 25 26   GLUCOSE 160* 183* 357* 173*  BUN 22 13 15 15   CREATININE 0.50 0.48 0.65 0.46  CALCIUM  --  8.5* 8.3* 8.7*   GFR: Estimated Creatinine Clearance: 56.7 mL/min (by C-G formula based on SCr of 0.46 mg/dL). Liver Function Tests: No results for input(s): "AST", "ALT", "ALKPHOS", "BILITOT", "PROT", "ALBUMIN" in the last 168 hours. No results for input(s): "LIPASE", "AMYLASE" in the last 168 hours. No results for input(s): "AMMONIA" in the last 168 hours. Coagulation Profile: No results for input(s): "INR", "  PROTIME" in the last 168 hours. Cardiac Enzymes: No results for input(s): "CKTOTAL", "CKMB", "CKMBINDEX", "TROPONINI" in the last 168 hours. BNP (last 3 results) No results for input(s): "PROBNP" in the last 8760 hours. HbA1C: No results for input(s): "HGBA1C" in the last 72 hours. CBG: Recent Labs  Lab 10/26/23 0804 10/26/23 1152 10/26/23 1630 10/26/23 2137 10/27/23 0808  GLUCAP 193* 162* 73 226* 154*   Lipid Profile: No results for input(s): "CHOL", "HDL", "LDLCALC", "TRIG", "CHOLHDL", "LDLDIRECT" in the last 72 hours. Thyroid Function Tests: No results for input(s): "TSH",  "T4TOTAL", "FREET4", "T3FREE", "THYROIDAB" in the last 72 hours. Anemia Panel: No results for input(s): "VITAMINB12", "FOLATE", "FERRITIN", "TIBC", "IRON", "RETICCTPCT" in the last 72 hours. Sepsis Labs: Recent Labs  Lab 10/25/23 1240 10/25/23 1245 10/25/23 1348 10/25/23 1749  PROCALCITON 0.32  --   --   --   LATICACIDVEN  --  1.9 3.0* 2.2*    Recent Results (from the past 240 hours)  Culture, blood (Routine X 2) w Reflex to ID Panel     Status: None (Preliminary result)   Collection Time: 10/25/23 12:40 PM   Specimen: BLOOD  Result Value Ref Range Status   Specimen Description BLOOD LEFT HAND  Final   Special Requests   Final    BOTTLES DRAWN AEROBIC AND ANAEROBIC Blood Culture adequate volume   Culture   Final    NO GROWTH 2 DAYS Performed at [facility], [address], [place], [place] [address]    Report Status PENDING  Incomplete  Culture, blood (Routine X 2) w Reflex to ID Panel     Status: None (Preliminary result)   Collection Time: 10/25/23 12:41 PM   Specimen: BLOOD  Result Value Ref Range Status   Specimen Description BLOOD RIGHT HAND  Final   Special Requests   Final    BOTTLES DRAWN AEROBIC AND ANAEROBIC Blood Culture adequate volume   Culture   Final    NO GROWTH 2 DAYS Performed at [facility], [address] [place], [place] [address]    Report Status PENDING  Incomplete  Respiratory (~20 pathogens) panel by PCR     Status: None   Collection Time: 10/25/23  6:00 PM   Specimen: Nasopharyngeal Swab; Respiratory  Result Value Ref Range Status   Adenovirus NOT DETECTED NOT DETECTED Final   Coronavirus 229E NOT DETECTED NOT DETECTED Final    Comment: (NOTE) The Coronavirus on the Respiratory Panel, DOES NOT test for the novel  Coronavirus (2019 nCoV)    Coronavirus HKU1 NOT DETECTED NOT DETECTED Final   Coronavirus NL63 NOT DETECTED NOT DETECTED Final   Coronavirus OC43 NOT DETECTED NOT DETECTED Final   Metapneumovirus NOT  DETECTED NOT DETECTED Final   Rhinovirus / Enterovirus NOT DETECTED NOT DETECTED Final   Influenza A NOT DETECTED NOT DETECTED Final   Influenza B NOT DETECTED NOT DETECTED Final   Parainfluenza Virus 1 NOT DETECTED NOT DETECTED Final   Parainfluenza Virus 2 NOT DETECTED NOT DETECTED Final   Parainfluenza Virus 3 NOT DETECTED NOT DETECTED Final   Parainfluenza Virus 4 NOT DETECTED NOT DETECTED Final   Respiratory Syncytial Virus NOT DETECTED NOT DETECTED Final   Bordetella pertussis NOT DETECTED NOT DETECTED Final   Bordetella Parapertussis NOT DETECTED NOT DETECTED Final   Chlamydophila pneumoniae NOT DETECTED NOT DETECTED Final   Mycoplasma pneumoniae NOT DETECTED NOT DETECTED Final    Comment: Performed at [facility], [address], [place], [place] [address]  Radiology Studies: DG Chest 1 View Result Date: 10/26/2023 CLINICAL DATA:  Shortness of breath. EXAM: CHEST  1 VIEW COMPARISON:  10/13/2023 FINDINGS: Right lung is clear. The patchy opacity at the left base seen previously is similar and could represent atelectasis or infiltrate. Interstitial markings are diffusely coarsened with chronic features. Cardiopericardial silhouette is at upper limits of normal for size. Prominent skin fold overlies the lateral left lower lung. No acute bony abnormality. IMPRESSION: Patchy opacity at the left base is similar to prior and could represent atelectasis or infiltrate. Electronically Signed   By: Kennith Center M.D.   On: 10/26/2023 09:07   DG Chest 1 View Result Date: 10/25/2023 CLINICAL DATA:  CHF.  History of asthma, COPD, and hypertension. EXAM: CHEST  1 VIEW COMPARISON:  Chest radiograph dated 08/17/2023 and CT dated 09/11/2023. FINDINGS: Faint densities at the left lung base may represent atelectasis or developing infiltrate. No pleural effusion pneumothorax. The cardiac silhouette is within limits. No acute osseous pathology. IMPRESSION: Left basilar atelectasis versus  developing infiltrate. Electronically Signed   By: Elgie Collard M.D.   On: 10/25/2023 15:24        Scheduled Meds:  acetaminophen  1,000 mg Oral Q6H   allopurinol  100 mg Oral QHS   atorvastatin  10 mg Oral Daily   docusate sodium  100 mg Oral BID   doxycycline  100 mg Oral Q12H   enoxaparin (LOVENOX) injection  40 mg Subcutaneous Q24H   fluticasone furoate-vilanterol  1 puff Inhalation Daily   And   umeclidinium bromide  1 puff Inhalation Daily   furosemide  40 mg Oral Daily   gabapentin  800 mg Oral TID   guaiFENesin  600 mg Oral BID   insulin aspart  0-15 Units Subcutaneous TID WC   insulin aspart  0-5 Units Subcutaneous QHS   levothyroxine  88 mcg Oral Daily   metoCLOPramide (REGLAN) injection  5 mg Intravenous Q6H   pioglitazone  15 mg Oral q morning   potassium chloride  10 mEq Oral Daily   senna  1 tablet Oral BID   sodium chloride flush  3 mL Intravenous Q12H   Continuous Infusions:  cefTRIAXone (ROCEPHIN)  IV 2 g (10/26/23 1618)     LOS: 2 days       Charise Killian, MD [facility] Pager [phone]  If 7PM-7AM, please contact night-coverage [email or web address] 10/27/2023, 9:01 AM

## 2023-10-28 DIAGNOSIS — J189 Pneumonia, unspecified organism: Secondary | ICD-10-CM | POA: Diagnosis not present

## 2023-10-28 DIAGNOSIS — Z981 Arthrodesis status: Secondary | ICD-10-CM | POA: Diagnosis not present

## 2023-10-28 LAB — BASIC METABOLIC PANEL
Anion gap: 8 (ref 5–15)
BUN: 17 mg/dL (ref 8–23)
CO2: 27 mmol/L (ref 22–32)
Calcium: 9 mg/dL (ref 8.9–10.3)
Chloride: 101 mmol/L (ref 98–111)
Creatinine, Ser: 0.55 mg/dL (ref 0.44–1.00)
GFR, Estimated: >60 mL/min (ref >60)
Glucose, Bld: 236 mg/dL — ABNORMAL HIGH (ref 70–99)
Potassium: 3.8 mmol/L (ref 3.5–5.1)
Sodium: 136 mmol/L (ref 135–145)

## 2023-10-28 LAB — CBC
HCT: 37.8 % (ref 36.0–46.0)
Hemoglobin: 12.8 g/dL (ref 12.0–15.0)
MCH: 30 pg (ref 26.0–34.0)
MCHC: 33.9 g/dL (ref 30.0–36.0)
MCV: 88.5 fL (ref 80.0–100.0)
Platelets: 228 10*3/uL (ref 150–400)
RBC: 4.27 MIL/uL (ref 3.87–5.11)
RDW: 16.4 % — ABNORMAL HIGH (ref 11.5–15.5)
WBC: 9.1 10*3/uL (ref 4.0–10.5)
nRBC: 0 % (ref 0.0–0.2)

## 2023-10-28 LAB — URINE CULTURE: Culture: >=100000 — AB

## 2023-10-28 LAB — GLUCOSE, CAPILLARY
Glucose-Capillary: 188 mg/dL — ABNORMAL HIGH (ref 70–99)
Glucose-Capillary: 194 mg/dL — ABNORMAL HIGH (ref 70–99)

## 2023-10-28 MED ORDER — SENNA 8.6 MG PO TABS: 1.0000 | ORAL_TABLET | Freq: Two times a day (BID) | ORAL | 0 refills | Status: DC | PRN

## 2023-10-28 MED ORDER — DOXYCYCLINE HYCLATE 100 MG PO CAPS: 100.0000 mg | ORAL_CAPSULE | Freq: Two times a day (BID) | ORAL | 0 refills | Status: AC

## 2023-10-28 MED ORDER — OXYCODONE HCL 5 MG PO TABS: 5.0000 mg | ORAL_TABLET | Freq: Four times a day (QID) | ORAL | 0 refills | Status: AC | PRN

## 2023-10-28 MED ORDER — METHOCARBAMOL 500 MG PO TABS: 500.0000 mg | ORAL_TABLET | Freq: Three times a day (TID) | ORAL | 0 refills | Status: AC | PRN

## 2023-10-28 NOTE — Discharge Summary (Signed)
 Physician Discharge Summary  Teresa [name] ZOX:[id number] DOB: [birth year] DOA: 10/22/2023  PCP: Bosie Clos, MD  Admit date: 10/22/2023 Discharge date: 10/28/2023  Admitted From: home  Disposition:  home   Recommendations for Outpatient Follow-up:  Follow up with PCP in 1-2 weeks   Home Health: yes Equipment/Devices:  Discharge Condition: stable  CODE STATUS: full  Diet recommendation: Heart Healthy / Carb Modified   Brief/Interim Summary: HPI was taken from Dr. Chipper Herb: Teresa [name] is a 78 y.o. female with past medical history of IDDM, HTN, hypothyroidism, diabetic neuropathy, came to hospital for elective cervical spine surgery..  Patient underwent C-spine C5-6 ACDF surgery 2 days ago.  Since yesterday patient has developed intractable nauseous dry heaves and vomited x 1 overnight and this morning patient started develop shortness of breath and fever 102.1 and tachycardia.   Discharge Diagnoses:  Principal Problem:   S/P cervical spinal fusion Active Problems:   Cervical myelopathy (HCC)   Myelomalacia (HCC)   Spinal stenosis in cervical region  Sepsis: met criteria w/ fever, leukocytosis & likely secondary to CAP. Continue on IV rocephin, doxy, bronchodilators & encourage incentive spirometry. Sepsis resolved   CAP: continue on IV rocephin, doxy, bronchodilators & encourage incentive spirometry. Still not requiring supplemental oxygen.    Intractable nausea and vomiting: likely secondary to undiagnosed gastroparesis. Reglan q6h. Resolved   DM2: fair control, HbA1c 7.5 06/2023. Continue on SSI w/ accuchecks   Diabetic neuropathy: continue on home dose of gabapentin    Hypokalemia: WNL today    Obesity: BMI 32.1. Would benefit from weight loss     Discharge Instructions  Discharge Instructions     Diet - low sodium heart healthy   Complete by: As directed    Diet Carb Modified   Complete by: As directed    Discharge instructions   Complete by: As  directed    F/u w/ PCP in 1-2 weeks. F/u w/ neuro surg, Dr. Myer Haff or PA Alycia Rossetti, in 1-2 days to evaluate neck incision   Incentive spirometry RT   Complete by: As directed    Increase activity slowly   Complete by: As directed       Allergies as of 10/28/2023       Reactions   Clarithromycin Nausea And Vomiting, Other (See Comments)   Codeine    Hallucination   Dilaudid  [hydromorphone Hcl] Nausea And Vomiting   Iodine Hives, Itching   Iohexol Hives, Itching   Onion Diarrhea, Nausea And Vomiting   Abdominal pain   Povidone-iodine Itching   Tamiflu  [oseltamivir Phosphate] Nausea And Vomiting   Abdominal Pain,   Zolpidem Nausea And Vomiting, Other (See Comments)   Hallucinations   Bacitracin-neomycin-polymyxin Itching, Rash   Benzalkonium Chloride Itching, Rash, Swelling   Lidocaine Hcl Itching, Rash, Swelling   Morphine Nausea And Vomiting, Rash   Neomycin-bacitracin Zn-polymyx Itching, Rash   Tape Itching, Rash   Adhesive tape - silicone   Tapentadol Rash        Medication List     STOP taking these medications    acetaminophen 500 MG tablet Commonly known as: TYLENOL       TAKE these medications    albuterol 108 (90 Base) MCG/ACT inhaler Commonly known as: VENTOLIN HFA Inhale 2 puffs into the lungs every 6 (six) hours as needed for shortness of breath.   allopurinol 100 MG tablet Commonly known as: ZYLOPRIM Take 1 tablet (100 mg total) by mouth daily. What changed: when to take this  atorvastatin 10 MG tablet Commonly known as: LIPITOR TAKE ONE TABLET BY MOUTH EVERYDAY AT BEDTIME   doxycycline 100 MG capsule Commonly known as: VIBRAMYCIN Take 1 capsule (100 mg total) by mouth 2 (two) times daily for 2 days.   Eliquis 5 MG Tabs tablet Generic drug: apixaban Take 5 mg by mouth 2 (two) times daily.   empagliflozin 25 MG Tabs tablet Commonly known as: Jardiance Take 1 tablet (25 mg total) by mouth daily.   FreeStyle Libre 3 Reader Devi 1  each every 14 (fourteen) days.   furosemide 40 MG tablet Commonly known as: LASIX Take 1 tablet (40 mg total) by mouth daily.   gabapentin 800 MG tablet Commonly known as: NEURONTIN Take 1 tablet (800 mg total) by mouth 3 (three) times daily.   glipiZIDE 5 MG tablet Commonly known as: GLUCOTROL Take 2.5 mg by mouth daily before breakfast.   levothyroxine 88 MCG tablet Commonly known as: SYNTHROID TAKE 1 TABLET BY MOUTH EVERY DAY   metFORMIN 1000 MG tablet Commonly known as: GLUCOPHAGE Take 1 tablet (1,000 mg total) by mouth 2 (two) times daily.   methocarbamol 500 MG tablet Commonly known as: ROBAXIN Take 1 tablet (500 mg total) by mouth every 8 (eight) hours as needed for muscle spasms.   nystatin cream Commonly known as: MYCOSTATIN Apply 1 Application topically 2 (two) times daily as needed (skin irritation/rash.). What changed: Another medication with the same name was changed. Make sure you understand how and when to take each.   nystatin powder Commonly known as: MYCOSTATIN/NYSTOP Apply 1 Application topically 3 (three) times daily. What changed:  when to take this reasons to take this   ondansetron 4 MG disintegrating tablet Commonly known as: ZOFRAN-ODT Take 1 tablet (4 mg total) by mouth every 8 (eight) hours as needed for nausea or vomiting.   oxyCODONE 5 MG immediate release tablet Commonly known as: Oxy IR/ROXICODONE Take 1 tablet (5 mg total) by mouth every 6 (six) hours as needed for up to 5 days for moderate pain (pain score 4-6) or severe pain (pain score 7-10).   Ozempic (2 MG/DOSE) 8 MG/3ML Sopn Generic drug: Semaglutide (2 MG/DOSE) Inject 2 mg into the skin every Sunday.   pioglitazone 15 MG tablet Commonly known as: ACTOS Take 1 tablet (15 mg total) by mouth every morning.   potassium chloride 10 MEQ tablet Commonly known as: KLOR-CON Take 1 tablet (10 mEq total) by mouth daily. Be sure to take dose on day of surgery. Follow up with PCP for  repeat labs.   promethazine 12.5 MG tablet Commonly known as: PHENERGAN Take 1 tablet (12.5 mg total) by mouth every 6 (six) hours as needed for nausea or vomiting.   senna 8.6 MG Tabs tablet Commonly known as: SENOKOT Take 1 tablet (8.6 mg total) by mouth 2 (two) times daily as needed for mild constipation.   Trelegy Ellipta 200-62.5-25 MCG/ACT Aepb Generic drug: Fluticasone-Umeclidin-Vilant Inhale 1 puff into the lungs daily.        Follow-up Information     Drake Leach, PA-C Follow up on 11/06/2023.   Specialty: Neurosurgery Contact information: [address] [place] [address] [phone]                Allergies  Allergen Reactions   Clarithromycin Nausea And Vomiting and Other (See Comments)   Codeine     Hallucination   Dilaudid  [Hydromorphone Hcl] Nausea And Vomiting   Iodine Hives and Itching  Iohexol Hives and Itching   Onion Diarrhea and Nausea And Vomiting    Abdominal pain   Povidone-Iodine Itching   Tamiflu  [Oseltamivir Phosphate] Nausea And Vomiting    Abdominal Pain,   Zolpidem Nausea And Vomiting and Other (See Comments)    Hallucinations   Bacitracin-Neomycin-Polymyxin Itching and Rash   Benzalkonium Chloride Itching, Rash and Swelling   Lidocaine Hcl Itching, Rash and Swelling   Morphine Nausea And Vomiting and Rash   Neomycin-Bacitracin Zn-Polymyx Itching and Rash   Tape Itching and Rash    Adhesive tape - silicone   Tapentadol Rash    Consultations: Hospitalist    Procedures/Studies: DG Chest 1 View Result Date: 10/26/2023 CLINICAL DATA:  Shortness of breath. EXAM: CHEST  1 VIEW COMPARISON:  10/13/2023 FINDINGS: Right lung is clear. The patchy opacity at the left base seen previously is similar and could represent atelectasis or infiltrate. Interstitial markings are diffusely coarsened with chronic features. Cardiopericardial silhouette is at upper limits of normal for size. Prominent skin fold overlies  the lateral left lower lung. No acute bony abnormality. IMPRESSION: Patchy opacity at the left base is similar to prior and could represent atelectasis or infiltrate. Electronically Signed   By: Kennith Center M.D.   On: 10/26/2023 09:07   DG Chest 1 View Result Date: 10/25/2023 CLINICAL DATA:  CHF.  History of asthma, COPD, and hypertension. EXAM: CHEST  1 VIEW COMPARISON:  Chest radiograph dated 08/17/2023 and CT dated 09/11/2023. FINDINGS: Faint densities at the left lung base may represent atelectasis or developing infiltrate. No pleural effusion pneumothorax. The cardiac silhouette is within limits. No acute osseous pathology. IMPRESSION: Left basilar atelectasis versus developing infiltrate. Electronically Signed   By: Elgie Collard M.D.   On: 10/25/2023 15:24   DG Cervical Spine 2-3 Views Result Date: 10/22/2023 CLINICAL DATA:  Cervical fusion. EXAM: CERVICAL SPINE - 2-3 VIEW COMPARISON:  MRI 09/20/2023 FINDINGS: Intraoperative imaging demonstrates changes of ACDF at C5-6. No hardware or bony complicating feature. Normal alignment. IMPRESSION: ACDF C5-6.  No visible complicating feature. Electronically Signed   By: Charlett Nose M.D.   On: 10/22/2023 17:36   DG C-Arm 1-60 Min-No Report Result Date: 10/22/2023 Fluoroscopy was utilized by the requesting physician.  No radiographic interpretation.   (Echo, Carotid, EGD, Colonoscopy, ERCP)    Subjective: Pt c/o fatigue    Discharge Exam: Vitals:   10/28/23 0317 10/28/23 0727  BP: 128/64 (!) 142/69  Pulse: 70 67  Resp: 16 15  Temp: (!) 97.4 F (36.3 C) 97.9 F (36.6 C)  SpO2: 94% 95%   Vitals:   10/27/23 1625 10/27/23 1927 10/28/23 0317 10/28/23 0727  BP: 131/78 135/76 128/64 (!) 142/69  Pulse: 67 73 70 67  Resp: 16 16 16 15   Temp: 97.9 F (36.6 C) 98.2 F (36.8 C) (!) 97.4 F (36.3 C) 97.9 F (36.6 C)  TempSrc:  Oral Oral   SpO2: 95% 95% 94% 95%  Weight:      Height:        General: Pt is alert, awake, not in acute  distress Cardiovascular: S1/S2 +, no rubs, no gallops Respiratory: decreased breath sounds b/l  Abdominal: Soft, NT, obese, bowel sounds + Extremities: no edema, no cyanosis    The results of significant diagnostics from this hospitalization (including imaging, microbiology, ancillary and laboratory) are listed below for reference.     Microbiology: Recent Results (from the past 240 hours)  Culture, blood (Routine X 2) w Reflex to ID Panel  Status: None (Preliminary result)   Collection Time: 10/25/23 12:40 PM   Specimen: BLOOD  Result Value Ref Range Status   Specimen Description BLOOD LEFT HAND  Final   Special Requests   Final    BOTTLES DRAWN AEROBIC AND ANAEROBIC Blood Culture adequate volume   Culture   Final    NO GROWTH 3 DAYS Performed at [facility], [address] [place], [place] [address]    Report Status PENDING  Incomplete  Culture, blood (Routine X 2) w Reflex to ID Panel     Status: None (Preliminary result)   Collection Time: 10/25/23 12:41 PM   Specimen: BLOOD  Result Value Ref Range Status   Specimen Description BLOOD RIGHT HAND  Final   Special Requests   Final    BOTTLES DRAWN AEROBIC AND ANAEROBIC Blood Culture adequate volume   Culture   Final    NO GROWTH 3 DAYS Performed at [facility], [address], [place], [place] [address]    Report Status PENDING  Incomplete  Urine Culture (for pregnant, neutropenic or urologic patients or patients with an indwelling urinary catheter)     Status: Abnormal   Collection Time: 10/25/23  4:31 PM   Specimen: Urine, Clean Catch  Result Value Ref Range Status   Specimen Description   Final    URINE, CLEAN CATCH Performed at [facility], [address] [place], [place] [address]    Special Requests   Final    NONE Performed at [facility], [address] [place], [place] [address]    Culture >=100,000 COLONIES/mL ENTEROBACTER CLOACAE (A)  Final   Report Status  10/28/2023 FINAL  Final   Organism ID, Bacteria ENTEROBACTER CLOACAE (A)  Final      Susceptibility   Enterobacter cloacae - MIC*    CEFEPIME <=0.12 SENSITIVE Sensitive     CIPROFLOXACIN <=0.25 SENSITIVE Sensitive     GENTAMICIN <=1 SENSITIVE Sensitive     IMIPENEM 1 SENSITIVE Sensitive     NITROFURANTOIN 64 INTERMEDIATE Intermediate     TRIMETH/SULFA <=20 SENSITIVE Sensitive     PIP/TAZO 64 INTERMEDIATE Intermediate ug/mL    * >=100,000 COLONIES/mL ENTEROBACTER CLOACAE  Respiratory (~20 pathogens) panel by PCR     Status: None   Collection Time: 10/25/23  6:00 PM   Specimen: Nasopharyngeal Swab; Respiratory  Result Value Ref Range Status   Adenovirus NOT DETECTED NOT DETECTED Final   Coronavirus 229E NOT DETECTED NOT DETECTED Final    Comment: (NOTE) The Coronavirus on the Respiratory Panel, DOES NOT test for the novel  Coronavirus (2019 nCoV)    Coronavirus HKU1 NOT DETECTED NOT DETECTED Final   Coronavirus NL63 NOT DETECTED NOT DETECTED Final   Coronavirus OC43 NOT DETECTED NOT DETECTED Final   Metapneumovirus NOT DETECTED NOT DETECTED Final   Rhinovirus / Enterovirus NOT DETECTED NOT DETECTED Final   Influenza A NOT DETECTED NOT DETECTED Final   Influenza B NOT DETECTED NOT DETECTED Final   Parainfluenza Virus 1 NOT DETECTED NOT DETECTED Final   Parainfluenza Virus 2 NOT DETECTED NOT DETECTED Final   Parainfluenza Virus 3 NOT DETECTED NOT DETECTED Final   Parainfluenza Virus 4 NOT DETECTED NOT DETECTED Final   Respiratory Syncytial Virus NOT DETECTED NOT DETECTED Final   Bordetella pertussis NOT DETECTED NOT DETECTED Final   Bordetella Parapertussis NOT DETECTED NOT DETECTED Final   Chlamydophila pneumoniae NOT DETECTED NOT DETECTED Final   Mycoplasma pneumoniae NOT DETECTED NOT DETECTED Final  Comment: Performed at [facility], [address], [place], [place] [address]     Labs: BNP (last 3 results) No results for input(s): "BNP" in the last 8760  hours. Basic Metabolic Panel: Recent Labs  Lab 10/22/23 1112 10/25/23 1240 10/26/23 0424 10/27/23 0621 10/28/23 0135  NA 137 135 138 137 136  K 4.7 3.0* 3.9 3.6 3.8  CL 106 102 102 105 101  CO2  --  25 25 26 27   GLUCOSE 160* 183* 357* 173* 236*  BUN 22 13 15 15 17   CREATININE 0.50 0.48 0.65 0.46 0.55  CALCIUM  --  8.5* 8.3* 8.7* 9.0   Liver Function Tests: No results for input(s): "AST", "ALT", "ALKPHOS", "BILITOT", "PROT", "ALBUMIN" in the last 168 hours. No results for input(s): "LIPASE", "AMYLASE" in the last 168 hours. No results for input(s): "AMMONIA" in the last 168 hours. CBC: Recent Labs  Lab 10/22/23 1112 10/25/23 1240 10/26/23 0424 10/27/23 0621 10/28/23 0135  WBC  --  13.8* 11.5* 9.3 9.1  HGB 15.6* 13.6 12.1 12.6 12.8  HCT 46.0 40.1 36.5 37.6 37.8  MCV  --  87.7 87.3 86.0 88.5  PLT  --  189 173 222 228   Cardiac Enzymes: No results for input(s): "CKTOTAL", "CKMB", "CKMBINDEX", "TROPONINI" in the last 168 hours. BNP: Invalid input(s): "POCBNP" CBG: Recent Labs  Lab 10/27/23 1157 10/27/23 1626 10/27/23 2021 10/28/23 0727 10/28/23 1134  GLUCAP 164* 205* 154* 188* 194*   D-Dimer No results for input(s): "DDIMER" in the last 72 hours. Hgb A1c No results for input(s): "HGBA1C" in the last 72 hours. Lipid Profile No results for input(s): "CHOL", "HDL", "LDLCALC", "TRIG", "CHOLHDL", "LDLDIRECT" in the last 72 hours. Thyroid function studies No results for input(s): "TSH", "T4TOTAL", "T3FREE", "THYROIDAB" in the last 72 hours.  Invalid input(s): "FREET3" Anemia work up No results for input(s): "VITAMINB12", "FOLATE", "FERRITIN", "TIBC", "IRON", "RETICCTPCT" in the last 72 hours. Urinalysis    Component Value Date/Time   COLORURINE YELLOW (A) 10/25/2023 1302   APPEARANCEUR CLEAR (A) 10/25/2023 1302   APPEARANCEUR Hazy 05/14/2014 0956   LABSPEC 1.010 10/25/2023 1302   LABSPEC 1.028 05/14/2014 0956   PHURINE 5.0 10/25/2023 1302   GLUCOSEU >=500  (A) 10/25/2023 1302   GLUCOSEU Negative 05/14/2014 0956   HGBUR NEGATIVE 10/25/2023 1302   BILIRUBINUR NEGATIVE 10/25/2023 1302   BILIRUBINUR Negative 12/08/2015 0959   BILIRUBINUR Negative 05/14/2014 0956   KETONESUR NEGATIVE 10/25/2023 1302   PROTEINUR NEGATIVE 10/25/2023 1302   UROBILINOGEN 0.2 12/08/2015 0959   NITRITE POSITIVE (A) 10/25/2023 1302   LEUKOCYTESUR NEGATIVE 10/25/2023 1302   LEUKOCYTESUR 3+ 05/14/2014 0956   Sepsis Labs Recent Labs  Lab 10/25/23 1240 10/26/23 0424 10/27/23 0621 10/28/23 0135  WBC 13.8* 11.5* 9.3 9.1   Microbiology Recent Results (from the past 240 hours)  Culture, blood (Routine X 2) w Reflex to ID Panel     Status: None (Preliminary result)   Collection Time: 10/25/23 12:40 PM   Specimen: BLOOD  Result Value Ref Range Status   Specimen Description BLOOD LEFT HAND  Final   Special Requests   Final    BOTTLES DRAWN AEROBIC AND ANAEROBIC Blood Culture adequate volume   Culture   Final    NO GROWTH 3 DAYS Performed at [facility], [address] [place], [place] [address]    Report Status PENDING  Incomplete  Culture, blood (Routine X 2) w Reflex to ID Panel     Status: None (Preliminary result)  Collection Time: 10/25/23 12:41 PM   Specimen: BLOOD  Result Value Ref Range Status   Specimen Description BLOOD RIGHT HAND  Final   Special Requests   Final    BOTTLES DRAWN AEROBIC AND ANAEROBIC Blood Culture adequate volume   Culture   Final    NO GROWTH 3 DAYS Performed at [facility], [address], [place], [place] [address]    Report Status PENDING  Incomplete  Urine Culture (for pregnant, neutropenic or urologic patients or patients with an indwelling urinary catheter)     Status: Abnormal   Collection Time: 10/25/23  4:31 PM   Specimen: Urine, Clean Catch  Result Value Ref Range Status   Specimen Description   Final    URINE, CLEAN CATCH Performed at [facility], [address],  [place], [place] [address]    Special Requests   Final    NONE Performed at [facility], [address], [place], [place] [address]    Culture >=100,000 COLONIES/mL ENTEROBACTER CLOACAE (A)  Final   Report Status 10/28/2023 FINAL  Final   Organism ID, Bacteria ENTEROBACTER CLOACAE (A)  Final      Susceptibility   Enterobacter cloacae - MIC*    CEFEPIME <=0.12 SENSITIVE Sensitive     CIPROFLOXACIN <=0.25 SENSITIVE Sensitive     GENTAMICIN <=1 SENSITIVE Sensitive     IMIPENEM 1 SENSITIVE Sensitive     NITROFURANTOIN 64 INTERMEDIATE Intermediate     TRIMETH/SULFA <=20 SENSITIVE Sensitive     PIP/TAZO 64 INTERMEDIATE Intermediate ug/mL    * >=100,000 COLONIES/mL ENTEROBACTER CLOACAE  Respiratory (~20 pathogens) panel by PCR     Status: None   Collection Time: 10/25/23  6:00 PM   Specimen: Nasopharyngeal Swab; Respiratory  Result Value Ref Range Status   Adenovirus NOT DETECTED NOT DETECTED Final   Coronavirus 229E NOT DETECTED NOT DETECTED Final    Comment: (NOTE) The Coronavirus on the Respiratory Panel, DOES NOT test for the novel  Coronavirus (2019 nCoV)    Coronavirus HKU1 NOT DETECTED NOT DETECTED Final   Coronavirus NL63 NOT DETECTED NOT DETECTED Final   Coronavirus OC43 NOT DETECTED NOT DETECTED Final   Metapneumovirus NOT DETECTED NOT DETECTED Final   Rhinovirus / Enterovirus NOT DETECTED NOT DETECTED Final   Influenza A NOT DETECTED NOT DETECTED Final   Influenza B NOT DETECTED NOT DETECTED Final   Parainfluenza Virus 1 NOT DETECTED NOT DETECTED Final   Parainfluenza Virus 2 NOT DETECTED NOT DETECTED Final   Parainfluenza Virus 3 NOT DETECTED NOT DETECTED Final   Parainfluenza Virus 4 NOT DETECTED NOT DETECTED Final   Respiratory Syncytial Virus NOT DETECTED NOT DETECTED Final   Bordetella pertussis NOT DETECTED NOT DETECTED Final   Bordetella Parapertussis NOT DETECTED NOT DETECTED Final   Chlamydophila pneumoniae NOT DETECTED NOT DETECTED Final   Mycoplasma  pneumoniae NOT DETECTED NOT DETECTED Final    Comment: Performed at [facility], [address] [place], [place] [address]     Time coordinating discharge: Over 30 minutes  SIGNED:   Charise Killian, MD  [facility] 10/28/2023, 12:04 PM Pager   If 7PM-7AM, please contact night-coverage [email or web address]

## 2023-10-28 NOTE — Plan of Care (Signed)

## 2023-10-29 ENCOUNTER — Telehealth: Payer: Self-pay

## 2023-10-29 ENCOUNTER — Ambulatory Visit (INDEPENDENT_AMBULATORY_CARE_PROVIDER_SITE_OTHER)

## 2023-10-29 DIAGNOSIS — Z09 Encounter for follow-up examination after completed treatment for conditions other than malignant neoplasm: Secondary | ICD-10-CM

## 2023-10-29 DIAGNOSIS — G959 Disease of spinal cord, unspecified: Secondary | ICD-10-CM

## 2023-10-29 DIAGNOSIS — Z4889 Encounter for other specified surgical aftercare: Secondary | ICD-10-CM

## 2023-10-29 NOTE — Progress Notes (Signed)
 Patient presented today for trimming of internal suture that was protruding out from the lateral side of incision.   Overall incision is well-appearing with no redness, swelling, or drainage.   End trimmed close skin with no complications. Patient is aware to follow up with [place] regarding any concerns prior to her post-op appointment next week.

## 2023-10-29 NOTE — Telephone Encounter (Signed)
 I spoke with the patient. She reports a stitch is poking out of her incision. She will head to the office now.

## 2023-10-29 NOTE — Telephone Encounter (Signed)
-----   Message from [facility] sent at 10/28/2023 11:40 AM EDT ----- Elvina Sidle-  She apparently has a stitch that is not cut flush to her skin.  Can you have a nurse visit with her?  Thanks [employer]

## 2023-11-02 LAB — CULTURE, BLOOD (ROUTINE X 2)
Culture: NO GROWTH
Culture: NO GROWTH
Special Requests: ADEQUATE
Special Requests: ADEQUATE

## 2023-11-02 NOTE — Progress Notes (Signed)
   REFERRING PHYSICIAN:  Bosie Clos, Md [address] [place],  [place] [address]  DOS: 10/22/23 ACDF C5-C6 for cervical myelopathy  HISTORY OF PRESENT ILLNESS: Teresa [name] is 2 weeks status post above surgery. Given robaxin and oxycodone on discharge from the hospital.   She was treated for sepsis postop that was likely due to CAP. Was also treated for intractable nausea and vomiting that they felt was likely due to undiagnosed gastroparesis. She has follow up with her PCP since discharge regarding these issues.   She has intermittent neck pain when turning her head. No arm pain. Her preop arm pain is gone! No numbness, tingling, or weakness. She feels like her walking is better.   She is taking prn oxycodone (not daily). She is taking robaxin bid.   She restarted her ELIQUIS 2 weeks postop (11/05/23).  PHYSICAL EXAMINATION:  NEUROLOGICAL:  General: In no acute distress.   Awake, alert, oriented to person, place, and time.  Pupils equal round and reactive to light.  Facial tone is symmetric.    Strength: Side Biceps Triceps Deltoid Interossei Grip Wrist Ext. Wrist Flex.  R 4+ 4+ 5 5 4 4 4   L 5 5 5 5  4+ 5 5   Incision c/d/i  Imaging:  Nothing new to review.   Assessment / Plan: Teresa [name] is doing reasonably well s/p above surgery. Treatment options reviewed with patient and following plan made:   - We discussed activity escalation and I have advised the patient to lift up to 10 pounds until 6 weeks after surgery (until follow up with Dr. Myer Haff).   - Reviewed wound care.  - She restarted her ELIQUIS 2 weeks postop (11/05/23). - Continue current medications including prn oxycodone (takes rarely) and prn robaxin.  - Discussed PT as she still has weakness. Cannot afford copay at this time.  - She thought she felt a stitch at incision. I don't see or feel anything. She will monitor and keep [place] updated.  - Follow up as scheduled in 4 weeks and prn.   Advised to  contact the office if any questions or concerns arise.   Drake Leach PA-C Dept of Neurosurgery

## 2023-11-06 ENCOUNTER — Encounter: Payer: Self-pay | Admitting: Orthopedic Surgery

## 2023-11-06 ENCOUNTER — Ambulatory Visit (INDEPENDENT_AMBULATORY_CARE_PROVIDER_SITE_OTHER): Admitting: Orthopedic Surgery

## 2023-11-06 VITALS — BP 128/64 | Temp 98.0°F | Ht 62.0 in | Wt 176.0 lb

## 2023-11-06 DIAGNOSIS — Z981 Arthrodesis status: Secondary | ICD-10-CM

## 2023-11-06 DIAGNOSIS — G959 Disease of spinal cord, unspecified: Secondary | ICD-10-CM

## 2023-11-21 ENCOUNTER — Ambulatory Visit (INDEPENDENT_AMBULATORY_CARE_PROVIDER_SITE_OTHER): Payer: PPO | Admitting: Podiatry

## 2023-11-21 ENCOUNTER — Encounter: Payer: Self-pay | Admitting: Podiatry

## 2023-11-21 VITALS — Ht 62.0 in | Wt 176.0 lb

## 2023-11-21 DIAGNOSIS — B351 Tinea unguium: Secondary | ICD-10-CM

## 2023-11-21 DIAGNOSIS — M79676 Pain in unspecified toe(s): Secondary | ICD-10-CM | POA: Diagnosis not present

## 2023-11-22 NOTE — Progress Notes (Signed)
  Subjective:  Patient ID: LAKEETA DOBOSZ, female    DOB: [birth year],  MRN: [id number]  Chief Complaint  Patient presents with   Nail Problem    [facility].    78 y.o. female presents with the above complaint. History confirmed with patient.  Nails are thickened elongated and causing discomfort in shoe gear.  Feet are doing well with no new issues but she had a recent cervical fusion which was complicated by postoperative pneumonia and sepsis was admitted to the ICU, she is doing better and trying to recover now.  Objective:  Physical Exam: warm, good capillary refill, no trophic changes or ulcerative lesions, normal DP and PT pulses, and varicose veins noted, thickened elongated yellow dystrophic discolored nails bilaterally x 10 with subungual debris,     Assessment:   1. Pain due to onychomycosis of toenail      Plan:  Patient was evaluated and treated and all questions answered.  Discussed the etiology and treatment options for the condition in detail with the patient.  Recommended debridement of the nails today. Sharp and mechanical debridement performed of all painful and mycotic nails today. Nails debrided in length and thickness using a nail nipper to level of comfort. Discussed treatment options including appropriate shoe gear. Follow up as needed for painful nails.   Blood sugar remains well-controlled with recent hemoglobin of 7.6%.   Return in about 3 months (around 02/20/2024) for at risk diabetic foot care.

## 2023-11-23 ENCOUNTER — Encounter: Payer: Self-pay | Admitting: Neurosurgery

## 2023-11-30 ENCOUNTER — Telehealth: Payer: Self-pay

## 2023-11-30 NOTE — Telephone Encounter (Signed)
 Shoes and inserts are in, appt. Set 5/2 at 8:30 in [facility] office  Ppw expires 7.14.25 Britton Cane CPed, CFo, CFm

## 2023-12-03 ENCOUNTER — Other Ambulatory Visit: Payer: Self-pay

## 2023-12-03 DIAGNOSIS — G959 Disease of spinal cord, unspecified: Secondary | ICD-10-CM

## 2023-12-04 ENCOUNTER — Encounter: Payer: Self-pay | Admitting: Neurosurgery

## 2023-12-04 ENCOUNTER — Ambulatory Visit
Admission: RE | Admit: 2023-12-04 | Discharge: 2023-12-04 | Disposition: A | Discharge status: Home / Self Care | Source: Ambulatory Visit | Attending: Neurosurgery | Admitting: Neurosurgery

## 2023-12-04 ENCOUNTER — Ambulatory Visit (INDEPENDENT_AMBULATORY_CARE_PROVIDER_SITE_OTHER): Admitting: Neurosurgery

## 2023-12-04 ENCOUNTER — Ambulatory Visit
Admission: RE | Admit: 2023-12-04 | Discharge: 2023-12-04 | Disposition: A | Discharge status: Home / Self Care | Attending: Neurosurgery | Admitting: Neurosurgery

## 2023-12-04 VITALS — BP 128/78 | Temp 97.6°F | Ht 62.0 in | Wt 176.0 lb

## 2023-12-04 DIAGNOSIS — G959 Disease of spinal cord, unspecified: Secondary | ICD-10-CM

## 2023-12-04 DIAGNOSIS — Z981 Arthrodesis status: Secondary | ICD-10-CM

## 2023-12-04 NOTE — Progress Notes (Signed)
   REFERRING PHYSICIAN:  Nikki Barters, Md [address] [place],  [place] [address]  DOS: 10/22/23 ACDF C5-C6 for cervical myelopathy  HISTORY OF PRESENT ILLNESS: Teresa [name] is status post above surgery.   She is doing well.  She did have 1 fall, but overall she is improving.  PHYSICAL EXAMINATION:  NEUROLOGICAL:  General: In no acute distress.   Awake, alert, oriented to person, place, and time.  Pupils equal round and reactive to light.  Facial tone is symmetric.    Strength: Side Biceps Triceps Deltoid Interossei Grip Wrist Ext. Wrist Flex.  R 4+ 4+ 5 5 4+ 4+ 4+  L 5 5 5 5 5 5 5    Incision c/d/i  Imaging:  No complications noted - some settling at C5/6  Assessment / Plan: Teresa [name] is doing well s/p above surgery.   I have given her exercises for her neck.  We reviewed activity limitations.  We will see her back in 6 weeks.    Jodeen Munch MD Dept of Neurosurgery

## 2023-12-07 ENCOUNTER — Ambulatory Visit (INDEPENDENT_AMBULATORY_CARE_PROVIDER_SITE_OTHER)

## 2023-12-07 DIAGNOSIS — M2141 Flat foot [pes planus] (acquired), right foot: Secondary | ICD-10-CM | POA: Diagnosis not present

## 2023-12-07 DIAGNOSIS — M205X9 Other deformities of toe(s) (acquired), unspecified foot: Secondary | ICD-10-CM

## 2023-12-07 DIAGNOSIS — E1140 Type 2 diabetes mellitus with diabetic neuropathy, unspecified: Secondary | ICD-10-CM

## 2023-12-07 DIAGNOSIS — M2142 Flat foot [pes planus] (acquired), left foot: Secondary | ICD-10-CM

## 2023-12-07 NOTE — Progress Notes (Signed)
 Patient presents today to pick up diabetic shoes and insoles.  Patient was dispensed 1 pair of diabetic shoes and 3 pairs of total contact diabetic insoles. Fit was satisfactory. Instructions for break-in and wear was reviewed and a copy was given to the patient.   Re-appointment for regularly scheduled diabetic foot care visits or if they should experience any trouble with the shoes or insoles.  Shoes were a little big patient wants to try over weekend if we have to re-order she will call and order 8.5 MED   Britton Cane Cped CFo, CFm

## 2024-01-04 NOTE — Progress Notes (Addendum)
   REFERRING PHYSICIAN:  Nikki Barters, Md [address] [place],  [place] [address]  DOS: 10/22/23 ACDF C5-C6 for cervical myelopathy  HISTORY OF PRESENT ILLNESS:  She was doing well at her last visit and showing improvement.   She has some intermittent left sided pain that starts near collar bone and radiates up to her head- this is not new.   No current neck or arm pain. Still has some tingling in her fingers. Feels like strength is better. Walking is good.   PHYSICAL EXAMINATION:  NEUROLOGICAL:  General: In no acute distress.   Awake, alert, oriented to person, place, and time.  Pupils equal round and reactive to light.  Facial tone is symmetric.    Strength: Side Biceps Triceps Deltoid Interossei Grip Wrist Ext. Wrist Flex.  R 4+ 4+ 5 5 4+ 5 5  L 5 5 5 5 5 5 5    Incision well healed  Imaging:  Cervical xrays dated 01/10/24:  No complications noted from previous xrays.  Assessment / Plan: Teresa [name] is doing well s/p above surgery. Treatment options reviewed with patient and following plan made:   - She can slowly return to activity as tolerated.  - Follow up with me in August for her lower back.  - Follow up in 6 months and prn for her neck. Will need xrays prior to her next visit.   The intermittent left sided pain that starts near collar bone and radiates up to her head is not likely cervical mediated. She should follow up with PCP about this.   Advised to contact the office if any questions or concerns arise.   Lucetta Russel PA-C Dept of Neurosurgery

## 2024-01-07 ENCOUNTER — Other Ambulatory Visit: Payer: Self-pay

## 2024-01-07 DIAGNOSIS — G959 Disease of spinal cord, unspecified: Secondary | ICD-10-CM

## 2024-01-10 ENCOUNTER — Ambulatory Visit: Admitting: Orthopedic Surgery

## 2024-01-10 ENCOUNTER — Ambulatory Visit
Admission: RE | Admit: 2024-01-10 | Discharge: 2024-01-10 | Disposition: A | Discharge status: Home / Self Care | Source: Ambulatory Visit | Attending: Orthopedic Surgery | Admitting: Orthopedic Surgery

## 2024-01-10 ENCOUNTER — Ambulatory Visit
Admission: RE | Admit: 2024-01-10 | Discharge: 2024-01-10 | Disposition: A | Discharge status: Home / Self Care | Attending: Orthopedic Surgery | Admitting: Orthopedic Surgery

## 2024-01-10 ENCOUNTER — Encounter: Payer: Self-pay | Admitting: Orthopedic Surgery

## 2024-01-10 VITALS — BP 118/70 | Temp 98.1°F | Ht 62.0 in | Wt 176.0 lb

## 2024-01-10 DIAGNOSIS — G959 Disease of spinal cord, unspecified: Secondary | ICD-10-CM | POA: Diagnosis present

## 2024-01-10 DIAGNOSIS — Z981 Arthrodesis status: Secondary | ICD-10-CM

## 2024-01-16 ENCOUNTER — Ambulatory Visit (INDEPENDENT_AMBULATORY_CARE_PROVIDER_SITE_OTHER): Admitting: Dermatology

## 2024-01-16 DIAGNOSIS — W908XXA Exposure to other nonionizing radiation, initial encounter: Secondary | ICD-10-CM

## 2024-01-16 DIAGNOSIS — L814 Other melanin hyperpigmentation: Secondary | ICD-10-CM | POA: Diagnosis not present

## 2024-01-16 DIAGNOSIS — L578 Other skin changes due to chronic exposure to nonionizing radiation: Secondary | ICD-10-CM | POA: Diagnosis not present

## 2024-01-16 DIAGNOSIS — L720 Epidermal cyst: Secondary | ICD-10-CM | POA: Diagnosis not present

## 2024-01-16 NOTE — Patient Instructions (Signed)

## 2024-01-16 NOTE — Progress Notes (Signed)
   Follow-Up Visit   Subjective  Teresa [name] is a 78 y.o. female who presents for the following: growth lower lip, not sure how long she has had but getting larger and bothersome. The patient has spots, moles and lesions to be evaluated, some may be new or changing and the patient may have concern these could be cancer.   The following portions of the chart were reviewed this encounter and updated as appropriate: medications, allergies, medical history  Review of Systems:  No other skin or systemic complaints except as noted in HPI or Assessment and Plan.  Objective  Well appearing patient in no apparent distress; mood and affect are within normal limits.   A focused examination was performed of the following areas: face  Relevant exam findings are noted in the Assessment and Plan.  central lower lip at vermillion edge 3.60mm firm white pap  Assessment & Plan   ACTINIC DAMAGE - chronic, secondary to cumulative UV radiation exposure/sun exposure over time - diffuse scaly erythematous macules with underlying dyspigmentation face - Recommend daily broad spectrum sunscreen SPF 30+ to sun-exposed areas, reapply every 2 hours as needed.  - Recommend staying in the shade or wearing long sleeves, sun glasses (UVA+UVB protection) and wide brim hats (4-inch brim around the entire circumference of the hat). - Call for new or changing lesions.  LENTIGINES Exam: scattered tan macules Due to sun exposure Treatment Plan: Benign-appearing, observe. Recommend daily broad spectrum sunscreen SPF 30+ to sun-exposed areas, reapply every 2 hours as needed.  Call for any changes    MILIA central lower lip at vermillion edge Discussed this is a type of cyst. Benign-appearing. Sometimes these will clear with OTC adapalene/Differin 0.1% cream QHS or retinol.  Discussed extraction if symptomatic.  Treatment Plan: Symptomatic, irritating, patient would like treated.  Procedure risks and benefits  were discussed with the patient including bruising and verbal consent was obtained. Following prep of the skin of the lower lip with an alcohol swab, area was injected with 1% lidocaine /epinephrine , extraction of milia was performed with cotton tip applicators following superficial incision made over their surfaces with a #11 surgical blade. Capillary hemostasis was achieved with 20% aluminum chloride solution. Vaseline ointment was applied to each site. The patient tolerated the procedure well.   Return if symptoms worsen or fail to improve.  I, Rollie Clipper, RMA, am acting as scribe for Artemio Larry, MD .   Documentation: I have reviewed the above documentation for accuracy and completeness, and I agree with the above.  Artemio Larry, MD

## 2024-02-20 ENCOUNTER — Ambulatory Visit (INDEPENDENT_AMBULATORY_CARE_PROVIDER_SITE_OTHER): Admitting: Podiatry

## 2024-02-20 VITALS — Ht 62.0 in | Wt 176.0 lb

## 2024-02-20 DIAGNOSIS — B351 Tinea unguium: Secondary | ICD-10-CM

## 2024-02-20 DIAGNOSIS — M79676 Pain in unspecified toe(s): Secondary | ICD-10-CM | POA: Diagnosis not present

## 2024-02-20 NOTE — Progress Notes (Signed)
  Subjective:  Patient ID: Teresa [name], female    DOB: [birth year],  MRN: [id number]  Chief Complaint  Patient presents with   Nail Problem    RM 3 Patient is here for routine foot care and nail trimming. Patient has no additional concerns.    78 y.o. female presents with the above complaint. History confirmed with patient.  Nails are thickened elongated and causing discomfort in shoe gear.  Debridement has been helpful.  She notes some spasm and contracture of the toes  Objective:  Physical Exam: warm, good capillary refill, no trophic changes or ulcerative lesions, normal DP and PT pulses, and varicose veins noted, thickened elongated yellow dystrophic discolored nails bilaterally x 10 with subungual debris, reducible toe contractures    Assessment:   1. Pain due to onychomycosis of toenail      Plan:  Patient was evaluated and treated and all questions answered.  Discussed the etiology and treatment options for the condition in detail with the patient.  Recommended debridement of the nails today. Sharp and mechanical debridement performed of all painful and mycotic nails today. Nails debrided in length and thickness using a nail nipper to level of comfort. Discussed treatment options including appropriate shoe gear. Follow up as needed for painful nails.   Her toe contractures likely are a result of her limb deformity and previous ankle fusion.  Her new diabetic shoes did not have her appropriate 1 cm lift which may be contributing to it.  I will see if we can add this to her insoles with new ones or if an external lift should be applied to her shoes with reselling at [facility].   Return in about 3 months (around 05/22/2024) for at risk diabetic foot care.

## 2024-03-04 ENCOUNTER — Other Ambulatory Visit: Payer: Self-pay | Admitting: Orthopedic Surgery

## 2024-03-04 DIAGNOSIS — Z981 Arthrodesis status: Secondary | ICD-10-CM

## 2024-03-04 DIAGNOSIS — G959 Disease of spinal cord, unspecified: Secondary | ICD-10-CM

## 2024-03-04 NOTE — Progress Notes (Signed)
   REFERRING PHYSICIAN:  Bertrum Charlie CROME, Md [address] [place],  [place] [address]  DOS: 10/22/23 ACDF C5-C6 for cervical myelopathy  DOS: 06/18/23  L4-L5 XLIF and posterior spinal fusion    HISTORY OF PRESENT ILLNESS:  She was doing well at her last visit and showing improvement. She has some left sided neck pain.   She is having more constant left sided pain that starts in her left anterior neck and goes up her face to her head causing a severe headache. She had this prior to her cervical surgery.   In the last week, she notes intermittent numbness in right arm and right leg. No numbness in her trunk. When she has the numbness, she feels unsteady with walking. No arm or leg pain, but numbness is an uncomfortable feeling. No weakness noted.   She has intermittent LBP that is worse with prolonged sitting and using stairs.    PHYSICAL EXAMINATION:  NEUROLOGICAL:  General: In no acute distress.   Awake, alert, oriented to person, place, and time.  Pupils equal round and reactive to light.  Facial tone is symmetric.    Strength: Side Biceps Triceps Deltoid Interossei Grip Wrist Ext. Wrist Flex.  R 5 5 5 5 5 5 5   L 5 5 5 5 5 5 5    Side Iliopsoas Quads Hamstring PF DF EHL  R 5 5 5 5 5 5   L 5 5 5 5 5 5    Bilateral upper and lower extremity sensation is intact to light touch.     Cervical incision well healed  Lumbar and right lateral incision well healed.   She has slow gait.    Imaging:  Cervical xrays dated 03/13/24:  No complications noted from previous xrays.  Report not yet available for above xrays.   Assessment / Plan: LIYLAH NAJARRO is doing well s/p above surgeries. She continues with left sided pain that starts in her left anterior neck and goes up her face to her head causing a severe headache. She had this prior to her cervical surgery.   In the last week, she notes intermittent numbness in right arm and right leg. No numbness in her trunk.   Treatment  options reviewed with patient and following plan made:   - She can continue with activity as tolerated.  - Will get lumbar xrays on her way out. I will message her with results.  - Referral to neurology for evaluation of left sided anterior neck pain/headaches. She has seen Dr. Lane in the past.  - No weakness on above exam in right arm or leg. Will watch numbness and if no improvement then consider EMG of bilateral upper/lower extremities. Will check on this when I send her message about xrays.  - Keep follow up with Dr. Clois in December.   Advised to contact the office if any questions or concerns arise.   I spent a total of 20 minutes in face-to-face and non-face-to-face activities related to this patient's care today including review of outside records, review of imaging, review of symptoms, physical exam, discussion of differential diagnosis, discussion of treatment options, and documentation.   Glade Boys PA-C Dept of Neurosurgery

## 2024-03-13 ENCOUNTER — Ambulatory Visit
Admission: RE | Admit: 2024-03-13 | Discharge: 2024-03-13 | Disposition: A | Discharge status: Home / Self Care | Attending: Orthopedic Surgery | Admitting: Orthopedic Surgery

## 2024-03-13 ENCOUNTER — Ambulatory Visit
Admission: RE | Admit: 2024-03-13 | Discharge: 2024-03-13 | Disposition: A | Discharge status: Home / Self Care | Source: Ambulatory Visit | Attending: Orthopedic Surgery | Admitting: Orthopedic Surgery

## 2024-03-13 ENCOUNTER — Ambulatory Visit (INDEPENDENT_AMBULATORY_CARE_PROVIDER_SITE_OTHER): Payer: PPO | Admitting: Orthopedic Surgery

## 2024-03-13 ENCOUNTER — Encounter: Payer: Self-pay | Admitting: Orthopedic Surgery

## 2024-03-13 VITALS — BP 128/70 | Ht 62.0 in | Wt 168.0 lb

## 2024-03-13 DIAGNOSIS — Z981 Arthrodesis status: Secondary | ICD-10-CM

## 2024-03-13 DIAGNOSIS — G959 Disease of spinal cord, unspecified: Secondary | ICD-10-CM | POA: Diagnosis present

## 2024-03-13 DIAGNOSIS — M48061 Spinal stenosis, lumbar region without neurogenic claudication: Secondary | ICD-10-CM

## 2024-03-13 DIAGNOSIS — M51369 Other intervertebral disc degeneration, lumbar region without mention of lumbar back pain or lower extremity pain: Secondary | ICD-10-CM | POA: Diagnosis not present

## 2024-03-13 DIAGNOSIS — R519 Headache, unspecified: Secondary | ICD-10-CM

## 2024-03-18 ENCOUNTER — Encounter: Payer: Self-pay | Admitting: Orthopedic Surgery

## 2024-03-18 NOTE — Telephone Encounter (Signed)
 Lumbar xrays dated 03/13/24:  No complications noted.   Report not yet available for above xrays.

## 2024-03-28 DIAGNOSIS — G959 Disease of spinal cord, unspecified: Secondary | ICD-10-CM

## 2024-03-28 DIAGNOSIS — Z981 Arthrodesis status: Secondary | ICD-10-CM

## 2024-03-31 NOTE — Addendum Note (Signed)
 Addended byBETHA HILMA HASTINGS on: 03/31/2024 07:53 PM   Modules accepted: Orders

## 2024-03-31 NOTE — Telephone Encounter (Signed)
 See other MyChart message from today.   Dr. Clois recommends repeat cervical  MRI and follow up with him to review.   Cervical MRI ordered.

## 2024-04-04 ENCOUNTER — Ambulatory Visit
Admission: RE | Admit: 2024-04-04 | Discharge: 2024-04-04 | Disposition: A | Discharge status: Home / Self Care | Source: Ambulatory Visit | Attending: Orthopedic Surgery | Admitting: Orthopedic Surgery

## 2024-04-04 DIAGNOSIS — Z981 Arthrodesis status: Secondary | ICD-10-CM | POA: Diagnosis present

## 2024-04-04 DIAGNOSIS — G959 Disease of spinal cord, unspecified: Secondary | ICD-10-CM | POA: Insufficient documentation

## 2024-04-08 ENCOUNTER — Encounter: Payer: Self-pay | Admitting: Podiatry

## 2024-04-10 ENCOUNTER — Telehealth: Payer: Self-pay

## 2024-04-10 NOTE — Telephone Encounter (Signed)
 I called the pt to schedule an appointment (add 1 cm lift to left foot) she states she got no dm shoe inserts when she got her shoes. She would like them to be ordered with the lift

## 2024-04-29 ENCOUNTER — Encounter: Payer: Self-pay | Admitting: Neurosurgery

## 2024-04-29 ENCOUNTER — Ambulatory Visit (INDEPENDENT_AMBULATORY_CARE_PROVIDER_SITE_OTHER): Admitting: Neurosurgery

## 2024-04-29 VITALS — BP 110/70 | Ht 62.0 in | Wt 168.0 lb

## 2024-04-29 DIAGNOSIS — G959 Disease of spinal cord, unspecified: Secondary | ICD-10-CM

## 2024-04-29 DIAGNOSIS — W19XXXA Unspecified fall, initial encounter: Secondary | ICD-10-CM | POA: Diagnosis not present

## 2024-04-29 DIAGNOSIS — R200 Anesthesia of skin: Secondary | ICD-10-CM | POA: Diagnosis not present

## 2024-04-29 DIAGNOSIS — Z981 Arthrodesis status: Secondary | ICD-10-CM

## 2024-04-29 DIAGNOSIS — M545 Low back pain, unspecified: Secondary | ICD-10-CM

## 2024-04-29 NOTE — Progress Notes (Signed)
   REFERRING PHYSICIAN:  Bertrum Charlie CROME, Md [address] [place],  [place] [address]  DOS: 10/22/23 ACDF C5-C6 for cervical myelopathy  DOS: 06/18/23  L4-L5 XLIF and posterior spinal fusion    HISTORY OF PRESENT ILLNESS:  She suffered a fall a few weeks back.  She has had back pain since that time.  She has persistent numbness in her arms.  Her balance has been an issue.      PHYSICAL EXAMINATION:  NEUROLOGICAL:  General: In no acute distress.   Awake, alert, oriented to person, place, and time.  Pupils equal round and reactive to light.  Facial tone is symmetric.    Strength: Side Biceps Triceps Deltoid Interossei Grip Wrist Ext. Wrist Flex.  R 5 5 4+ 4+ 4+ 5 5  L 5 5 4+ 4+ 4+ 5 5   Side Iliopsoas Quads Hamstring PF DF EHL  R 5 5 5 5 5 5   L 5 5 5 5 5 5    Bilateral upper and lower extremity sensation is intact to light touch.     Cervical incision well healed  Lumbar and right lateral incision well healed.   She has slow gait.    Imaging:  Cervical xrays dated 03/13/24:  No complications noted from previous xrays.  C spine MRI 04/04/2024 IMPRESSION: 1. Anterior cervical fusion at C5-C6. 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 2. 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 3. 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     Electronically Signed   By: Clem Savory M.D.   On: 04/15/2024 10:17   Assessment / Plan: Teresa [name] is doing well s/p above surgeries.  She is having some back pain after a fall.  I would like to start her on physical therapy for her back pain.  For her bilateral upper extremity numbness, I I recommend pursuing a nerve conduction study.  She may have a peripheral nerve issue versus a sequela of cervical myelopathy.  I would like to see her back in 2 months   I spent a total of 20 minutes in face-to-face and non-face-to-face activities related to this patient's care today  including review of outside records, review of imaging, review of symptoms, physical exam, discussion of differential diagnosis, discussion of treatment options, and documentation.   Glade Boys PA-C Dept of Neurosurgery

## 2024-05-04 ENCOUNTER — Other Ambulatory Visit: Payer: Self-pay

## 2024-05-04 ENCOUNTER — Emergency Department

## 2024-05-04 DIAGNOSIS — S43101A Unspecified dislocation of right acromioclavicular joint, initial encounter: Secondary | ICD-10-CM | POA: Insufficient documentation

## 2024-05-04 DIAGNOSIS — E039 Hypothyroidism, unspecified: Secondary | ICD-10-CM | POA: Insufficient documentation

## 2024-05-04 DIAGNOSIS — W06XXXA Fall from bed, initial encounter: Secondary | ICD-10-CM | POA: Insufficient documentation

## 2024-05-04 DIAGNOSIS — Z8616 Personal history of COVID-19: Secondary | ICD-10-CM | POA: Diagnosis not present

## 2024-05-04 DIAGNOSIS — E119 Type 2 diabetes mellitus without complications: Secondary | ICD-10-CM | POA: Insufficient documentation

## 2024-05-04 DIAGNOSIS — I110 Hypertensive heart disease with heart failure: Secondary | ICD-10-CM | POA: Diagnosis not present

## 2024-05-04 DIAGNOSIS — S0990XA Unspecified injury of head, initial encounter: Secondary | ICD-10-CM | POA: Diagnosis not present

## 2024-05-04 DIAGNOSIS — Y9384 Activity, sleeping: Secondary | ICD-10-CM | POA: Diagnosis not present

## 2024-05-04 DIAGNOSIS — I2510 Atherosclerotic heart disease of native coronary artery without angina pectoris: Secondary | ICD-10-CM | POA: Diagnosis not present

## 2024-05-04 DIAGNOSIS — I482 Chronic atrial fibrillation, unspecified: Secondary | ICD-10-CM | POA: Diagnosis not present

## 2024-05-04 DIAGNOSIS — J4489 Other specified chronic obstructive pulmonary disease: Secondary | ICD-10-CM | POA: Diagnosis not present

## 2024-05-04 DIAGNOSIS — I5030 Unspecified diastolic (congestive) heart failure: Secondary | ICD-10-CM | POA: Diagnosis not present

## 2024-05-04 DIAGNOSIS — S4991XA Unspecified injury of right shoulder and upper arm, initial encounter: Secondary | ICD-10-CM | POA: Diagnosis present

## 2024-05-04 DIAGNOSIS — M542 Cervicalgia: Secondary | ICD-10-CM | POA: Diagnosis not present

## 2024-05-04 NOTE — ED Triage Notes (Signed)
 Pt to ed from home via POV for a possible syncope that happened last night. Pt rolled out of bed last night and landed on her right shoulder. Pt also has left sided pain. Pt is caox4, in no acute distress and ambulatory to triage with her rollator walker.

## 2024-05-05 ENCOUNTER — Emergency Department
Admission: EM | Admit: 2024-05-05 | Discharge: 2024-05-05 | Disposition: A | Discharge status: Home / Self Care | Attending: Emergency Medicine | Admitting: Emergency Medicine

## 2024-05-05 ENCOUNTER — Emergency Department

## 2024-05-05 DIAGNOSIS — W19XXXA Unspecified fall, initial encounter: Secondary | ICD-10-CM

## 2024-05-05 DIAGNOSIS — S43101A Unspecified dislocation of right acromioclavicular joint, initial encounter: Secondary | ICD-10-CM

## 2024-05-05 DIAGNOSIS — I482 Chronic atrial fibrillation, unspecified: Secondary | ICD-10-CM

## 2024-05-05 LAB — COMPREHENSIVE METABOLIC PANEL WITH GFR
ALT: 23 U/L (ref 0–44)
AST: 32 U/L (ref 15–41)
Albumin: 4.2 g/dL (ref 3.5–5.0)
Alkaline Phosphatase: 89 U/L (ref 38–126)
Anion gap: 14 (ref 5–15)
BUN: 22 mg/dL (ref 8–23)
CO2: 23 mmol/L (ref 22–32)
Calcium: 8.9 mg/dL (ref 8.9–10.3)
Chloride: 100 mmol/L (ref 98–111)
Creatinine, Ser: 0.84 mg/dL (ref 0.44–1.00)
GFR, Estimated: >60 mL/min (ref >60)
Glucose, Bld: 147 mg/dL — ABNORMAL HIGH (ref 70–99)
Potassium: 3.4 mmol/L — ABNORMAL LOW (ref 3.5–5.1)
Sodium: 137 mmol/L (ref 135–145)
Total Bilirubin: 0.7 mg/dL (ref 0.0–1.2)
Total Protein: 7.9 g/dL (ref 6.5–8.1)

## 2024-05-05 LAB — CBC
HCT: 44 % (ref 36.0–46.0)
Hemoglobin: 14.8 g/dL (ref 12.0–15.0)
MCH: 30.4 pg (ref 26.0–34.0)
MCHC: 33.6 g/dL (ref 30.0–36.0)
MCV: 90.3 fL (ref 80.0–100.0)
Platelets: 211 K/uL (ref 150–400)
RBC: 4.87 MIL/uL (ref 3.87–5.11)
RDW: 15.4 % (ref 11.5–15.5)
WBC: 10.5 K/uL (ref 4.0–10.5)
nRBC: 0 % (ref 0.0–0.2)

## 2024-05-05 NOTE — ED Provider Notes (Signed)
 [facility] Provider Note    Event Date/Time   First MD Initiated Contact with Patient 05/05/24 0151     (approximate)   History   Chief Complaint: Near Syncope   HPI  Teresa [name] is a 78 y.o. female with a history of hypertension, anxiety, COPD, atrial fibrillation who comes ED complaining of fall out of bed during sleep.  She had gotten in bed, went to sleep, and then woke up on the floor.  Denies any headache or vomiting or fever.  She does complain of pain on the left side of her neck that is worse with turning her head.  No vision changes    Patient is seeing cardiology for history of dizziness, pulmonology for asthma, neurology for myelomalacia and chronic paresthesias, and neurosurgery for cervical myelopathy.    Past Medical History:  Diagnosis Date   (HFpEF) heart failure with preserved ejection fraction (HCC)    a.) TTE 09/22/2014: EF >55%, no RWMAs, mild MAC, normal RVSF, mild TR/PR, mod MR; b.) TTE 08/04/2019: EF >55%, no RWMAs, normal RVSF, triv MR/TR; c.) TTE 06/14/2021: EF 60-65%, no RMWAs, normal RVSF, triv MR   Anginal pain    Anxiety    Aortic atherosclerosis    Arthritis    Asthma    Beta-blockers contraindicated    a.) significant bradycardia in the past while on therapy   Bilateral lower extremity edema    Cerebral microvascular disease    Cervical myelopathy (HCC)    Cervical spinal stenosis    Chronic bilateral low back pain with bilateral sciatica    a.) s.p L4-L5 lateral lumbar interbody fusion 06/18/2023   Complication of anesthesia    a.) PONV; b.) delayed emergence when she was younger   Concussion 08/17/2023   COPD (chronic obstructive pulmonary disease) (HCC)    Coronary artery disease    a.) cCTA 11/01/2021: Ca+ score 174 (67th percentile for age/sex match) --> FFR demonstrated no hemodynamically significant stenosis   Depression    Diverticulosis    DOE (dyspnea on exertion)    Dyspnea    GERD  (gastroesophageal reflux disease)    a.) no daily Tx; s/p Nissen fundoplication   Gout    Heart murmur    History of 2019 novel coronavirus disease (COVID-19) 05/12/2019   History of hiatal hernia    a.) s/p Nissen fundoplication   History of kidney stones    History of orthopnea    HLD (hyperlipidemia)    Hypertension    Hypothyroidism    Lumbar stenosis 2024   Memory loss    a.) s/p mini stroke in 2021; b.) on acetylcholinesterase inhibitor (donepazil)   Migraines    Mobitz type I Wenckebach atrioventricular block    a.) holter study 01/27/2022   On apixaban  therapy    PAF (paroxysmal atrial fibrillation) (HCC)    a.) CHA2DS2-VASc = 95 (age x2, sex, HFpEF, CVA x2, HTN, vascular disease history, T2DM) as of 06/14/2023;  b.) rate/rhythm maintained without pharmacological intervention; chronically anticoagulated using standard dose apixaban    Palpitations    Pneumonia due to COVID-19 virus 05/2019   PONV (postoperative nausea and vomiting)    PSVT (paroxysmal supraventricular tachycardia) 01/27/2022   a.) holter study 01/27/2022 - longest lasting 17 beats at a rate of 109 bpm   Pulmonary HTN (HCC)    RBBB (right bundle branch block)    Recurrent falls    Scleroderma (HCC)    Sleep apnea    a.) does  not require nocturnal PAP therapy   Stroke [facility]) 2021   a.) mild per patient; no findings on imaging; presented with transient aphasia, memory loss, extremity numbness   T2DM (type 2 diabetes mellitus) (HCC)    Transfusion of blood product refused for religious reason (Jehovah's witness)    Vertigo    Vitamin D  deficiency     Current Outpatient Rx   Order #: [id number] Class: Historical Med   Order #: [id number] Class: Historical Med   Order #: [id number] Class: Normal   Order #: [id number] Class: Normal   Order #: [id number] Class: Historical Med   Order #: [id number] Class: Historical Med   Order #: [id number] Class: Historical Med   Order #: [id number] Class: Normal   Order #:  [id number] Class: Historical Med   Order #: [id number] Class: Historical Med   Order #: [id number] Class: Normal   Order #: [id number] Class: Normal   Order #: [id number] Class: Historical Med   Order #: [id number] Class: Normal   Order #: [id number] Class: Normal   Order #: [id number] Class: Normal   Order #: [id number] Class: Historical Med   Order #: [id number] Class: Normal   Order #: [id number] Class: Historical Med   Order #: [id number] Class: Normal   Order #: [id number] Class: Historical Med   Order #: [id number] Class: Normal   Order #: [id number] Class: Normal    Past Surgical History:  Procedure Laterality Date   ABDOMINAL HYSTERECTOMY     ovaries intact   ANKLE ARTHROSCOPY Left 03/03/2022   Procedure: ANKLE ARTHROSCOPY;  Surgeon: Silva Juliene SAUNDERS, DPM;  Location: [facility];  Service: Podiatry;  Laterality: Left;  GENERAL WITH POP AND SAPHENOUS BLOCK   ANTERIOR CERVICAL DECOMP/DISCECTOMY FUSION N/A 10/22/2023   Procedure: ANTERIOR CERVICAL DECOMPRESSION/DISCECTOMY FUSION;  Surgeon: Clois Fret, MD;  Location: [facility];  Service: Neurosurgery;  Laterality: N/A;  C5-6 ANTERIOR CERVICAL DISCECTOMY AND FUSION   ANTERIOR LATERAL LUMBAR FUSION WITH PERCUTANEOUS SCREW 1 LEVEL N/A 06/18/2023   Procedure: L4-5 LATERAL LUMBAR INTERBODY FUSION AND POSTERIOR SPINAL FUSION;  Surgeon: Clois Fret, MD;  Location: [facility];  Service: Neurosurgery;  Laterality: N/A;   APPLICATION OF INTRAOPERATIVE CT SCAN N/A 06/18/2023   Procedure: APPLICATION OF INTRAOPERATIVE CT SCAN;  Surgeon: Clois Fret, MD;  Location: [facility];  Service: Neurosurgery;  Laterality: N/A;   BLADDER SURGERY     bladder tuck   BREAST BIOPSY Right 03/20/2016   neg x 2 area   CARDIAC CATHETERIZATION     CARPAL TUNNEL RELEASE Bilateral    CATARACT EXTRACTION W/PHACO Left 09/17/2018   Procedure: CATARACT EXTRACTION PHACO AND INTRAOCULAR LENS PLACEMENT (IOC) LEFT, DIABETIC;  Surgeon: Jaye Fallow, MD;  Location: [facility];   Service: Ophthalmology;  Laterality: Left;  US  00:34 CDE 4.85 Fluid pack lot # [id number]   CATARACT EXTRACTION W/PHACO Right 10/15/2018   Procedure: CATARACT EXTRACTION PHACO AND INTRAOCULAR LENS PLACEMENT (IOC)-RIGHT;  Surgeon: Jaye Fallow, MD;  Location: [facility];  Service: Ophthalmology;  Laterality: Right;  US  00:27.6 CDE 3.43 Fluid Pack Lot # [id number]   COLONOSCOPY WITH PROPOFOL  N/A 09/27/2018   Procedure: COLONOSCOPY WITH PROPOFOL ;  Surgeon: Janalyn Keene NOVAK, MD;  Location: [facility];  Service: Endoscopy;  Laterality: N/A;   DILATION AND CURETTAGE OF UTERUS     EYE SURGERY     eyelid   FRACTURE SURGERY     left ankle-plate and screws palced   GRAFT APPLICATION Left 03/03/2022   Procedure: BONE GRAFT;  Surgeon: Silva Juliene SAUNDERS, DPM;  Location: [facility];  Service: Podiatry;  Laterality: Left;   HARDWARE REMOVAL Left 03/03/2022   Procedure: POSSIBLE  HARDWARE REMOVAL;  Surgeon: Silva Juliene SAUNDERS, DPM;  Location: [facility];  Service: Podiatry;  Laterality: Left;   LITHOTRIPSY     NISSEN FUNDOPLICATION     SHOULDER SURGERY Right    rotator cuff   TONSILLECTOMY      Physical Exam   Triage Vital Signs: ED Triage Vitals  Encounter Vitals Group     BP 05/04/24 2347 134/75     Girls Systolic BP Percentile --      Girls Diastolic BP Percentile --      Boys Systolic BP Percentile --      Boys Diastolic BP Percentile --      Pulse Rate 05/04/24 2347 83     Resp 05/04/24 2347 16     Temp 05/04/24 2347 98.4 F (36.9 C)     Temp Source 05/04/24 2347 Oral     SpO2 05/04/24 2347 96 %     Weight --      Height --      Head Circumference --      Peak Flow --      Pain Score 05/04/24 2345 10     Pain Loc --      Pain Education --      Exclude from Growth Chart --     Most recent vital signs: Vitals:   05/05/24 0230 05/05/24 0300  BP: 123/70 132/69  Pulse: 82 78  Resp: 16 16  Temp:    SpO2: 94% 93%    General: Awake, no distress.  CV:  Good peripheral  perfusion.  Regular rate rhythm Resp:  Normal effort.  Clear lungs Abd:  No distention.  Soft nontender Other:  Tenderness at the right superior aspect of the shoulder   ED Results / Procedures / Treatments   Labs (all labs ordered are listed, but only abnormal results are displayed) Labs Reviewed  COMPREHENSIVE METABOLIC PANEL WITH GFR - Abnormal; Notable for the following components:      Result Value   Potassium 3.4 (*)    Glucose, Bld 147 (*)    All other components within normal limits  CBC     EKG Interpreted by me Sinus rhythm rate of 84.  Right bundle branch block.  First-degree AV block.  No acute ischemic changes   RADIOLOGY X-ray right shoulder interpreted by me, no acute fracture.  Radiology report reviewed noting AC joint separation CT head and cervical spine unremarkable   PROCEDURES:  Procedures   MEDICATIONS ORDERED IN ED: Medications - No data to display   IMPRESSION / MDM / ASSESSMENT AND PLAN / ED COURSE  I reviewed the triage vital signs and the nursing notes.  DDx: Intracranial hemorrhage, C-spine fracture, proximal humerus fracture, electrolyte derangement, anemia, AKI  Patient's presentation is most consistent with acute presentation with potential threat to life or bodily function.  Patient presents to the ED after mechanical fall out of bed at home during sleep.  Examination is reassuring, CT head obtained which is negative.  X-ray of the right shoulder shows an AC joint separation.  This is feeling better already with NSAIDs, stable for discharge.       FINAL CLINICAL IMPRESSION(S) / ED DIAGNOSES   Final diagnoses:  Fall in home, initial encounter  Acromioclavicular joint separation, right, initial encounter  Chronic atrial fibrillation (HCC)     Rx / DC Orders   ED Discharge Orders     None        Note:  This document was prepared  using Conservation officer, historic buildings and may include unintentional dictation errors.    Viviann Pastor, MD 05/05/24 [phone]

## 2024-05-21 ENCOUNTER — Ambulatory Visit (INDEPENDENT_AMBULATORY_CARE_PROVIDER_SITE_OTHER): Admitting: Podiatry

## 2024-05-21 ENCOUNTER — Encounter: Payer: Self-pay | Admitting: Podiatry

## 2024-05-21 VITALS — Ht 62.0 in | Wt 168.0 lb

## 2024-05-21 DIAGNOSIS — M79676 Pain in unspecified toe(s): Secondary | ICD-10-CM

## 2024-05-21 DIAGNOSIS — B351 Tinea unguium: Secondary | ICD-10-CM | POA: Diagnosis not present

## 2024-05-21 NOTE — Progress Notes (Signed)
  Subjective:  Patient ID: Teresa [name], female    DOB: Jan 04, 1946,  MRN: [id number]  Chief Complaint  Patient presents with   Diabetes    RM 2 [facility]    78 y.o. female presents with the above complaint. History confirmed with patient.  Nails are thickened elongated and causing discomfort in shoe gear.  Debridement has been helpful.  Injured her shoulder again recently and to have right shoulder replacement in the near future  Objective:  Physical Exam: warm, good capillary refill, no trophic changes or ulcerative lesions, normal DP and PT pulses, and varicose veins noted, thickened elongated yellow dystrophic discolored nails bilaterally x 10 with subungual debris    Assessment:   1. Pain due to onychomycosis of toenail      Plan:  Patient was evaluated and treated and all questions answered.  Discussed the etiology and treatment options for the condition in detail with the patient.  Recommended debridement of the nails today. Sharp and mechanical debridement performed of all painful and mycotic nails today. Nails debrided in length and thickness using a nail nipper to level of comfort. Discussed treatment options including appropriate shoe gear.  This is medically necessary due to her diabetes and neuropathy.  She will follow-up with me in 3 months for ongoing diabetic care.    No follow-ups on file.

## 2024-05-22 ENCOUNTER — Telehealth: Payer: Self-pay

## 2024-05-22 NOTE — Telephone Encounter (Signed)
 Teresa [name] in [facility] in [place] stated that they could not do PT because of her shoulder needing to be replaced. She did get an injection in her shoulder as well. [facility] in [place] And emg referral says do not sch, I am wondering if we need to keep her rescheduled f/u appt?

## 2024-05-22 NOTE — Telephone Encounter (Signed)
 PT note from 05/14/24: Patient was unable to tolerate full assessment for physical therapy at this time and is not tolerant to positioning for treatment for her lower back. She was advised to follow-up with orthopedics regarding her shoulder as this may need to be addressed and treated in therapy prior to assessing her lumbar spine. Also advised her to follow back up with physiatry for assessment of her back as epidural steroid injection may be more appropriate given her shoulder issue and limitation with therapy.    Ortho note from 05/19/24: We discussed reverse shoulder replacement, but we will hold off. She will follow-up on an as-needed basis.  They gave her a shoulder injection at that visit.  _________________________________________________   I contacted the patient. She states the shoulder injection has not helped and she is not interested in shoulder surgery at this time. She does not feel she can return to PT for her back at this time. We discussed that if she feels she can resume PT, she will call us  for a new referral. I did explain that she will likely need this before considering any future surgery on her back if surgery is an option. She is not interested in back injections at this time, as they did not help prior to the last surgery.   She reports she keeps falling. She has not received a call to schedule the EMG from [facility].  I informed her that we will fax the referral for the EMG to [facility] again. I asked her to call us  in 1 week if she has not received a call for an appointment.  We will keep her appointment with Dr Clois for December at this time as a plan to use this to go over her EMG results if she is able to get this done by then. We can consider changing the appointment with Dr Clois if she has the EMG sooner.

## 2024-06-09 ENCOUNTER — Other Ambulatory Visit: Payer: Self-pay | Admitting: Family Medicine

## 2024-06-09 DIAGNOSIS — Z1231 Encounter for screening mammogram for malignant neoplasm of breast: Secondary | ICD-10-CM

## 2024-06-16 ENCOUNTER — Telehealth: Payer: Self-pay

## 2024-06-16 NOTE — Telephone Encounter (Signed)
 Called and spoke to patient. DM shoe inserts are in [place]- pt can pu no appt needed

## 2024-07-01 ENCOUNTER — Ambulatory Visit: Admitting: Neurosurgery

## 2024-07-11 ENCOUNTER — Other Ambulatory Visit: Payer: Self-pay | Admitting: Family Medicine

## 2024-07-11 DIAGNOSIS — E2839 Other primary ovarian failure: Secondary | ICD-10-CM

## 2024-07-14 ENCOUNTER — Inpatient Hospital Stay: Admission: RE | Admit: 2024-07-14 | Discharge: 2024-07-14 | Attending: Family Medicine | Admitting: Family Medicine

## 2024-07-14 DIAGNOSIS — Z1231 Encounter for screening mammogram for malignant neoplasm of breast: Secondary | ICD-10-CM

## 2024-07-14 DIAGNOSIS — E2839 Other primary ovarian failure: Secondary | ICD-10-CM

## 2024-07-15 ENCOUNTER — Ambulatory Visit: Admitting: Neurosurgery

## 2024-07-18 ENCOUNTER — Ambulatory Visit: Admitting: Neurosurgery

## 2024-07-24 ENCOUNTER — Other Ambulatory Visit: Payer: Self-pay | Admitting: Family Medicine

## 2024-07-24 ENCOUNTER — Ambulatory Visit: Admitting: Neurosurgery

## 2024-07-24 ENCOUNTER — Encounter: Payer: Self-pay | Admitting: Neurosurgery

## 2024-07-24 VITALS — BP 102/64 | Ht 61.0 in | Wt 159.0 lb

## 2024-07-24 DIAGNOSIS — R921 Mammographic calcification found on diagnostic imaging of breast: Secondary | ICD-10-CM

## 2024-07-24 DIAGNOSIS — M545 Low back pain, unspecified: Secondary | ICD-10-CM

## 2024-07-24 DIAGNOSIS — G8929 Other chronic pain: Secondary | ICD-10-CM

## 2024-07-24 DIAGNOSIS — R928 Other abnormal and inconclusive findings on diagnostic imaging of breast: Secondary | ICD-10-CM

## 2024-07-24 DIAGNOSIS — G959 Disease of spinal cord, unspecified: Secondary | ICD-10-CM | POA: Diagnosis not present

## 2024-07-24 NOTE — Progress Notes (Signed)
° °  REFERRING PHYSICIAN:  Bertrum Charlie CROME, Md [address] [place],  [place] [address]  DOS: 10/22/23 ACDF C5-C6 for cervical myelopathy  DOS: 06/18/23  L4-L5 XLIF and posterior spinal fusion    HISTORY OF PRESENT ILLNESS:  Teresa [name] had a fall and injured her shoulder.  Teresa [name] had an injection which has helped.  Teresa [name] has no additional complaints at this time.     PHYSICAL EXAMINATION:  NEUROLOGICAL:  General: In no acute distress.   Awake, alert, oriented to person, place, and time.  Pupils equal round and reactive to light.  Facial tone is symmetric.    Strength: Side Biceps Triceps Deltoid Interossei Grip Wrist Ext. Wrist Flex.  R 5 5 4+ 4+ 4+ 5 5  L 5 5 4+ 4+ 4+ 5 5   Side Iliopsoas Quads Hamstring PF DF EHL  R 5 5 5 5 5 5   L 5 5 5 5 5 5    Bilateral upper and lower extremity sensation is intact to light touch.     Cervical incision well healed  Lumbar and right lateral incision well healed.   Teresa [name] has slow gait.    Imaging:  Cervical xrays dated 03/13/24:  No complications noted from previous xrays.  C spine MRI 04/04/2024 IMPRESSION: 1. Anterior cervical fusion at C5-C6. 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 2. 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 3. 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     Electronically Signed   By: Clem Savory M.D.   On: 04/15/2024 10:17   EMG 05/27/2024 Normal  Assessment / Plan: Teresa [name] is doing well s/p above surgeries.  Teresa [name] has some persistent numbness.  That is likely due to her cervical myelopathy.  Teresa [name] is 9 months out from surgery and unlikely to improve much from here.  I will see her back on an as-needed basis.     I spent a total of 10 minutes in face-to-face and non-face-to-face activities related to this patient's care today including review of outside records, review of imaging, review of symptoms, physical exam, discussion of differential  diagnosis, discussion of treatment options, and documentation.   Reeves Daisy MD Dept of Neurosurgery

## 2024-07-28 ENCOUNTER — Ambulatory Visit
Admission: RE | Admit: 2024-07-28 | Discharge: 2024-07-28 | Disposition: A | Discharge status: Home / Self Care | Source: Ambulatory Visit | Attending: Family Medicine | Admitting: Family Medicine

## 2024-07-28 DIAGNOSIS — R921 Mammographic calcification found on diagnostic imaging of breast: Secondary | ICD-10-CM | POA: Diagnosis present

## 2024-07-28 DIAGNOSIS — R928 Other abnormal and inconclusive findings on diagnostic imaging of breast: Secondary | ICD-10-CM | POA: Insufficient documentation

## 2024-08-20 ENCOUNTER — Ambulatory Visit: Admitting: Podiatry

## 2024-08-20 VITALS — Ht 61.0 in | Wt 159.0 lb

## 2024-08-20 DIAGNOSIS — M79675 Pain in left toe(s): Secondary | ICD-10-CM

## 2024-08-20 DIAGNOSIS — M79674 Pain in right toe(s): Secondary | ICD-10-CM | POA: Diagnosis not present

## 2024-08-20 DIAGNOSIS — B351 Tinea unguium: Secondary | ICD-10-CM

## 2024-08-20 NOTE — Progress Notes (Signed)
"  °  Subjective:  Patient ID: Teresa [name], female    DOB: September 04, 1945,  MRN: [id number]  Chief Complaint  Patient presents with   Diabetes    RM 2 [facility]    79 y.o. female presents with the above complaint. History confirmed with patient.  Nails are thickened elongated and causing discomfort in shoe gear.  Debridement has been helpful.  In  Objective:  Physical Exam: warm, good capillary refill, no trophic changes or ulcerative lesions, normal DP and PT pulses, and varicose veins noted, thickened elongated yellow dystrophic discolored nails bilaterally x 10 with subungual debris    Assessment:   1. Pain due to onychomycosis of toenail      Plan:  Patient was evaluated and treated and all questions answered.  Discussed the etiology and treatment options for the condition in detail with the patient.  Recommended debridement of the nails today. Sharp and mechanical debridement performed of all painful and mycotic nails today. Nails debrided in length and thickness using a nail nipper to level of comfort. Discussed treatment options including appropriate shoe gear.  This is medically necessary due to her diabetes and neuropathy.  She will follow-up with me in 3 months for ongoing diabetic care.    Return in about 3 months (around 11/18/2024) for at risk diabetic foot care.    "

## 2024-11-19 ENCOUNTER — Ambulatory Visit: Admitting: Podiatry
